# Patient Record
Sex: Female | Born: 1935 | Race: White | Hispanic: No | Marital: Married | Smoking: Never smoker
Health system: Southern US, Community
[De-identification: ages and names within clinical notes are randomized; demographics above are authoritative.]

## PROBLEM LIST (undated history)

## (undated) DIAGNOSIS — I10 Essential (primary) hypertension: Secondary | ICD-10-CM

## (undated) DIAGNOSIS — D0512 Intraductal carcinoma in situ of left breast: Secondary | ICD-10-CM

## (undated) DIAGNOSIS — R1310 Dysphagia, unspecified: Secondary | ICD-10-CM

## (undated) DIAGNOSIS — K3184 Gastroparesis: Secondary | ICD-10-CM

## (undated) DIAGNOSIS — M810 Age-related osteoporosis without current pathological fracture: Secondary | ICD-10-CM

## (undated) DIAGNOSIS — K219 Gastro-esophageal reflux disease without esophagitis: Secondary | ICD-10-CM

## (undated) DIAGNOSIS — N390 Urinary tract infection, site not specified: Secondary | ICD-10-CM

## (undated) DIAGNOSIS — K222 Esophageal obstruction: Secondary | ICD-10-CM

## (undated) DIAGNOSIS — D509 Iron deficiency anemia, unspecified: Secondary | ICD-10-CM

## (undated) DIAGNOSIS — K449 Diaphragmatic hernia without obstruction or gangrene: Secondary | ICD-10-CM

## (undated) DIAGNOSIS — M419 Scoliosis, unspecified: Secondary | ICD-10-CM

## (undated) DIAGNOSIS — I2699 Other pulmonary embolism without acute cor pulmonale: Secondary | ICD-10-CM

## (undated) HISTORY — PX: TUBAL LIGATION: SHX77

## (undated) HISTORY — PX: CATARACT EXTRACTION, BILATERAL: SHX1313

## (undated) HISTORY — DX: Gastroparesis: K31.84

## (undated) HISTORY — DX: Esophageal obstruction: K22.2

---

## 2002-07-30 DIAGNOSIS — D126 Benign neoplasm of colon, unspecified: Secondary | ICD-10-CM | POA: Insufficient documentation

## 2007-11-29 HISTORY — PX: LAPAROSCOPIC PARAESOPHAGEAL HERNIA REPAIR: SHX6307

## 2007-11-29 HISTORY — PX: BREAST LUMPECTOMY: SHX2

## 2009-09-03 DIAGNOSIS — R404 Transient alteration of awareness: Secondary | ICD-10-CM | POA: Insufficient documentation

## 2011-08-25 DIAGNOSIS — M810 Age-related osteoporosis without current pathological fracture: Secondary | ICD-10-CM | POA: Insufficient documentation

## 2011-09-05 DIAGNOSIS — D235 Other benign neoplasm of skin of trunk: Secondary | ICD-10-CM | POA: Insufficient documentation

## 2011-09-05 DIAGNOSIS — Z85828 Personal history of other malignant neoplasm of skin: Secondary | ICD-10-CM | POA: Insufficient documentation

## 2012-02-27 DIAGNOSIS — S82899A Other fracture of unspecified lower leg, initial encounter for closed fracture: Secondary | ICD-10-CM | POA: Insufficient documentation

## 2012-04-13 DIAGNOSIS — D0500 Lobular carcinoma in situ of unspecified breast: Secondary | ICD-10-CM | POA: Insufficient documentation

## 2012-05-10 DIAGNOSIS — H40009 Preglaucoma, unspecified, unspecified eye: Secondary | ICD-10-CM | POA: Insufficient documentation

## 2012-05-10 DIAGNOSIS — H2510 Age-related nuclear cataract, unspecified eye: Secondary | ICD-10-CM | POA: Insufficient documentation

## 2012-05-10 DIAGNOSIS — H524 Presbyopia: Secondary | ICD-10-CM | POA: Insufficient documentation

## 2012-10-18 DIAGNOSIS — Z7901 Long term (current) use of anticoagulants: Secondary | ICD-10-CM | POA: Insufficient documentation

## 2013-04-06 DIAGNOSIS — Z7901 Long term (current) use of anticoagulants: Secondary | ICD-10-CM | POA: Insufficient documentation

## 2013-06-11 DIAGNOSIS — M810 Age-related osteoporosis without current pathological fracture: Secondary | ICD-10-CM | POA: Insufficient documentation

## 2013-11-06 DIAGNOSIS — G8929 Other chronic pain: Secondary | ICD-10-CM | POA: Insufficient documentation

## 2014-03-19 DIAGNOSIS — B079 Viral wart, unspecified: Secondary | ICD-10-CM | POA: Insufficient documentation

## 2014-07-30 DIAGNOSIS — Z9841 Cataract extraction status, right eye: Secondary | ICD-10-CM | POA: Insufficient documentation

## 2014-08-28 DIAGNOSIS — H35363 Drusen (degenerative) of macula, bilateral: Secondary | ICD-10-CM | POA: Insufficient documentation

## 2015-03-23 DIAGNOSIS — D239 Other benign neoplasm of skin, unspecified: Secondary | ICD-10-CM | POA: Insufficient documentation

## 2016-08-16 DIAGNOSIS — I2699 Other pulmonary embolism without acute cor pulmonale: Secondary | ICD-10-CM | POA: Diagnosis present

## 2017-03-28 DIAGNOSIS — R1013 Epigastric pain: Secondary | ICD-10-CM | POA: Insufficient documentation

## 2017-09-11 DIAGNOSIS — I999 Unspecified disorder of circulatory system: Secondary | ICD-10-CM | POA: Insufficient documentation

## 2018-01-01 DIAGNOSIS — E538 Deficiency of other specified B group vitamins: Secondary | ICD-10-CM | POA: Insufficient documentation

## 2018-05-04 DIAGNOSIS — M5412 Radiculopathy, cervical region: Secondary | ICD-10-CM | POA: Insufficient documentation

## 2018-05-04 DIAGNOSIS — M19012 Primary osteoarthritis, left shoulder: Secondary | ICD-10-CM | POA: Insufficient documentation

## 2019-01-14 DIAGNOSIS — I10 Essential (primary) hypertension: Secondary | ICD-10-CM | POA: Insufficient documentation

## 2019-04-17 DIAGNOSIS — L650 Telogen effluvium: Secondary | ICD-10-CM | POA: Insufficient documentation

## 2019-04-17 DIAGNOSIS — L853 Xerosis cutis: Secondary | ICD-10-CM | POA: Insufficient documentation

## 2019-05-28 DIAGNOSIS — G4481 Hypnic headache: Secondary | ICD-10-CM | POA: Insufficient documentation

## 2019-07-04 DIAGNOSIS — N1831 Chronic kidney disease, stage 3a: Secondary | ICD-10-CM | POA: Insufficient documentation

## 2020-03-27 DIAGNOSIS — D509 Iron deficiency anemia, unspecified: Secondary | ICD-10-CM | POA: Insufficient documentation

## 2020-03-27 DIAGNOSIS — J479 Bronchiectasis, uncomplicated: Secondary | ICD-10-CM | POA: Insufficient documentation

## 2020-03-27 DIAGNOSIS — I350 Nonrheumatic aortic (valve) stenosis: Secondary | ICD-10-CM | POA: Insufficient documentation

## 2020-08-14 ENCOUNTER — Other Ambulatory Visit: Payer: Self-pay | Admitting: Surgery

## 2020-08-14 DIAGNOSIS — Z1231 Encounter for screening mammogram for malignant neoplasm of breast: Secondary | ICD-10-CM

## 2020-09-02 ENCOUNTER — Ambulatory Visit
Admission: RE | Admit: 2020-09-02 | Discharge: 2020-09-02 | Disposition: A | Discharge status: Home / Self Care | Payer: Medicare Other | Source: Ambulatory Visit | Attending: Surgery | Admitting: Surgery

## 2020-09-02 ENCOUNTER — Other Ambulatory Visit: Payer: Self-pay

## 2020-09-02 DIAGNOSIS — Z1231 Encounter for screening mammogram for malignant neoplasm of breast: Secondary | ICD-10-CM | POA: Diagnosis present

## 2020-09-25 ENCOUNTER — Encounter: Payer: Self-pay | Admitting: Urology

## 2020-09-25 ENCOUNTER — Ambulatory Visit (INDEPENDENT_AMBULATORY_CARE_PROVIDER_SITE_OTHER): Payer: Medicare Other | Admitting: Urology

## 2020-09-25 ENCOUNTER — Other Ambulatory Visit: Payer: Self-pay

## 2020-09-25 VITALS — BP 156/81 | HR 89 | Ht 61.0 in | Wt 91.4 lb

## 2020-09-25 DIAGNOSIS — R32 Unspecified urinary incontinence: Secondary | ICD-10-CM

## 2020-09-25 LAB — URINALYSIS, COMPLETE
Bilirubin, UA: NEGATIVE
Glucose, UA: NEGATIVE
Ketones, UA: NEGATIVE
Nitrite, UA: NEGATIVE
Protein,UA: NEGATIVE
RBC, UA: NEGATIVE
Specific Gravity, UA: 1.02 (ref 1.005–1.030)
Urobilinogen, Ur: 0.2 mg/dL (ref 0.2–1.0)
pH, UA: 6 (ref 5.0–7.5)

## 2020-09-25 LAB — MICROSCOPIC EXAMINATION: Bacteria, UA: NONE SEEN

## 2020-09-25 LAB — BLADDER SCAN AMB NON-IMAGING

## 2020-09-25 MED ORDER — TOLTERODINE TARTRATE ER 4 MG PO CP24: 4.0000 mg | ORAL_CAPSULE | Freq: Every day | ORAL | 11 refills | Status: DC

## 2020-09-25 NOTE — Patient Instructions (Signed)

## 2020-09-25 NOTE — Progress Notes (Signed)
09/25/2020 2:08 PM   [name] Mar 21, 1930 [id number]  Referring provider: Haydee Salter, MD [address] [place],  [place] [address]  Chief Complaint  Patient presents with   Urinary Incontinence    HPI:  Dominick was referred over for incontinence.  She complains of urgency, frequency and UUI. She tried oybutynin. No change and it made her feel nausea. Myrbetriq was expensive. Standing triggers.   Earlier this month she had some frequency and dysuria and was treated with cephalexin.  I did not see a culture. Symptoms resolved.   No NG risk. No pelvic surgery. She is on Elavil and now remeron. No constipation.   Her PVR is 6 mL.  CT scan May 2021 at [facility] was reported as benign.  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   [place]: No past medical history on file.  Surgical History: Past Surgical History:  Procedure Laterality Date   BREAST LUMPECTOMY Left 2009   Ductal Carcinoma insitu     Home Medications:  Allergies as of 09/25/2020      Reactions   Metronidazole Other (See Comments)   Other reaction(s): Other Mouth sores Mouth sores Mouth sores   Omeprazole Other (See Comments)   Other reaction(s): Arthralgias (intolerance) Other reaction(s): Other   Codeine Nausea And Vomiting, Nausea Only   Other reaction(s): Nausea Only But tolerates tramadol      Medication List       Accurate as of September 25, 2020  2:08 PM. If you have any questions, ask your nurse or doctor.        STOP taking these medications   calcium elemental as carbonate 400 MG chewable tablet Commonly known as: BARIATRIC TUMS ULTRA Stopped by: Festus Aloe, MD     TAKE these medications   acetaminophen 500 MG tablet Commonly known as: TYLENOL Take 500 mg by mouth at bedtime as needed.   amitriptyline 10 MG tablet Commonly known as: ELAVIL Take 10 mg by mouth at bedtime.   calcium carbonate 1250 (500 Ca) MG chewable  tablet Commonly known as: OS-CAL Chew by mouth.   Cholecalciferol 50 MCG (2000 UT) Caps Take by mouth.   cyanocobalamin 1000 MCG tablet Take by mouth.   diclofenac Sodium 1 % Gel Commonly known as: VOLTAREN Apply topically 4 (four) times daily.   econazole nitrate 1 % cream Apply topically daily.   ferrous sulfate 325 (65 FE) MG tablet Take by mouth.   folic acid 1 MG tablet Commonly known as: FOLVITE TAKE 1 TABLET(1 MG) BY MOUTH DAILY   loperamide 2 MG capsule Commonly known as: IMODIUM Take by mouth as needed for diarrhea or loose stools.   mirtazapine 15 MG tablet Commonly known as: REMERON Take 15 mg by mouth at bedtime.   oxybutynin 5 MG 24 hr tablet Commonly known as: DITROPAN-XL Take 5 mg by mouth daily.   pantoprazole 40 MG tablet Commonly known as: PROTONIX Take 40 mg by mouth daily.   promethazine 25 MG tablet Commonly known as: PHENERGAN Take 25 mg by mouth every 6 (six) hours as needed for nausea or vomiting.   SUMAtriptan 50 MG tablet Commonly known as: IMITREX Take 1 at onset of headache. May repeat once in two hours. Do not exceed 2 per day or 4 per week.   warfarin 5 MG tablet Commonly known as: COUMADIN TAKE 1 TABLET BY MOUTH NIGHTLY, EXCEPT TAKE 1/2 TABLET ON MONDAYS, WEDNESDAY AND FRIDAYS  Allergies:  Allergies  Allergen Reactions   Metronidazole Other (See Comments)    Other reaction(s): Other Mouth sores Mouth sores Mouth sores    Omeprazole Other (See Comments)    Other reaction(s): Arthralgias (intolerance) Other reaction(s): Other    Codeine Nausea And Vomiting and Nausea Only    Other reaction(s): Nausea Only But tolerates tramadol     Family History: Family History  Problem Relation Age of Onset   Breast cancer Neg Hx     Social History:  has no history on file for tobacco use, alcohol use, and drug use.   Physical Exam: BP (!) 156/81 (BP Location: Left Arm, Patient Position: Sitting, Cuff Size:  Small)    Pulse 89    Ht 5\' 1"  (1.549 m)    Wt 91 lb 6.4 oz (41.5 kg)    BMI 17.27 kg/m   Constitutional:  Alert and oriented, No acute distress. HEENT: [place] AT, moist mucus membranes.  Trachea midline, no masses. Cardiovascular: No clubbing, cyanosis, or edema. Respiratory: Normal respiratory effort, no increased work of breathing. GI: Abdomen is soft, nontender, nondistended, no abdominal masses GU: no CVA tenderness  Skin: No rashes, bruises or suspicious lesions. Neurologic: Grossly intact, no focal deficits, moving all 4 extremities. Psychiatric: Normal mood and affect.  Laboratory Data: No results found for: WBC, HGB, HCT, MCV, PLT  No results found for: CREATININE  No results found for: PSA  No results found for: TESTOSTERONE  No results found for: HGBA1C  Urinalysis No results found for: COLORURINE, APPEARANCEUR, LABSPEC, PHURINE, GLUCOSEU, HGBUR, BILIRUBINUR, KETONESUR, PROTEINUR, UROBILINOGEN, NITRITE, LEUKOCYTESUR  No results found for: LABMICR, [place], RBCUA, LABEPIT, MUCUS, BACTERIA  Pertinent Imaging:n/a  No results found for this or any previous visit.  No results found for this or any previous visit.  No results found for this or any previous visit.  No results found for this or any previous visit.  No results found for this or any previous visit.  No results found for this or any previous visit.  No results found for this or any previous visit.  No results found for this or any previous visit.   Assessment & Plan:    1. Urinary incontinence, unspecified type Discussed referral to PT, trial of other OAB meds and beta 3 as well.She elects trial of OAB meds and I sent tolterodine based on her formulary.  - Urinalysis, Complete - Bladder Scan (Post Void Residual) in office   No follow-ups on file.  Festus Aloe, MD  [facility] [address] [place], [place] [address] [phone]

## 2020-12-18 ENCOUNTER — Ambulatory Visit: Payer: Medicare Other | Admitting: Urology

## 2021-01-29 ENCOUNTER — Encounter: Payer: Self-pay | Admitting: Physician Assistant

## 2021-01-29 ENCOUNTER — Telehealth: Payer: Self-pay | Admitting: Physician Assistant

## 2021-01-29 ENCOUNTER — Other Ambulatory Visit: Payer: Self-pay

## 2021-01-29 ENCOUNTER — Ambulatory Visit (INDEPENDENT_AMBULATORY_CARE_PROVIDER_SITE_OTHER): Payer: Medicare Other | Admitting: Physician Assistant

## 2021-01-29 VITALS — BP 156/89 | HR 85 | Ht 61.0 in | Wt 93.0 lb

## 2021-01-29 DIAGNOSIS — N3001 Acute cystitis with hematuria: Secondary | ICD-10-CM

## 2021-01-29 LAB — MICROSCOPIC EXAMINATION: WBC, UA: >30 /hpf — AB (ref 0–5)

## 2021-01-29 LAB — URINALYSIS, COMPLETE
Bilirubin, UA: NEGATIVE
Glucose, UA: NEGATIVE
Ketones, UA: NEGATIVE
Nitrite, UA: POSITIVE — AB
Protein,UA: NEGATIVE
Specific Gravity, UA: <1.005 — ABNORMAL LOW (ref 1.005–1.030)
Urobilinogen, Ur: 0.2 mg/dL (ref 0.2–1.0)
pH, UA: 5.5 (ref 5.0–7.5)

## 2021-01-29 MED ORDER — NITROFURANTOIN MONOHYD MACRO 100 MG PO CAPS: 100.0000 mg | ORAL_CAPSULE | Freq: Two times a day (BID) | ORAL | 0 refills | Status: AC

## 2021-01-29 NOTE — Progress Notes (Signed)
01/29/2021 1:14 PM   Kristina Rowe 03-19-1930 [id number]  CC: Chief Complaint  Patient presents with  . Acute Visit    Urinary burning and frequency   HPI: Kristina Rowe is a 87 y.o. female with OAB wet on tolterodine after having failed oxybutynin and stopping Myrbetriq due to cost who presents today for evaluation of possible UTI.   Today she reports a 2-day history of dysuria, urgency, frequency, and increased urinary leakage.  She denies fever, chills, nausea, vomiting, gross hematuria, and flank pain.  She has not taken any medication to help her symptoms at home.  She denies a history of recurrent UTI and is on warfarin.  In-office UA today positive for 1+ blood, nitrites, and 2+ leukocyte esterase; urine microscopy with >30 WBCs/HPF, 3-10 RBCs/HPF, and many bacteria.  PMH: No past medical history on file.  Surgical History: Past Surgical History:  Procedure Laterality Date  . BREAST LUMPECTOMY Left 2009   Ductal Carcinoma insitu     Home Medications:  Allergies as of 01/29/2021      Reactions   Metronidazole Other (See Comments)   Other reaction(s): Other Mouth sores Mouth sores Mouth sores   Omeprazole Other (See Comments)   Other reaction(s): Arthralgias (intolerance) Other reaction(s): Other   Codeine Nausea And Vomiting, Nausea Only   Other reaction(s): Nausea Only But tolerates tramadol      Medication List       Accurate as of January 29, 2021  1:14 PM. If you have any questions, ask your nurse or doctor.        STOP taking these medications   ferrous sulfate 325 (65 FE) MG tablet Stopped by: Debroah Loop, PA-C   mirtazapine 15 MG tablet Commonly known as: REMERON Stopped by: Debroah Loop, PA-C   warfarin 5 MG tablet Commonly known as: COUMADIN Stopped by: Debroah Loop, PA-C     TAKE these medications   acetaminophen 500 MG tablet Commonly known as: TYLENOL Take 500 mg by mouth at bedtime as needed.    amitriptyline 10 MG tablet Commonly known as: ELAVIL Take 10 mg by mouth at bedtime.   calcium carbonate 1250 (500 Ca) MG chewable tablet Commonly known as: OS-CAL Chew by mouth.   Cholecalciferol 50 MCG (2000 UT) Caps Take by mouth.   cyanocobalamin 1000 MCG tablet Take by mouth.   diclofenac Sodium 1 % Gel Commonly known as: VOLTAREN Apply topically 4 (four) times daily.   econazole nitrate 1 % cream Apply topically daily.   folic acid 1 MG tablet Commonly known as: FOLVITE TAKE 1 TABLET(1 MG) BY MOUTH DAILY   loperamide 2 MG capsule Commonly known as: IMODIUM Take by mouth as needed for diarrhea or loose stools.   nitrofurantoin (macrocrystal-monohydrate) 100 MG capsule Commonly known as: MACROBID Take 1 capsule (100 mg total) by mouth every 12 (twelve) hours for 5 days. Started by: Debroah Loop, PA-C   pantoprazole 40 MG tablet Commonly known as: PROTONIX Take 40 mg by mouth daily.   promethazine 25 MG tablet Commonly known as: PHENERGAN Take 25 mg by mouth every 6 (six) hours as needed for nausea or vomiting.   SUMAtriptan 50 MG tablet Commonly known as: IMITREX Take 1 at onset of headache. May repeat once in two hours. Do not exceed 2 per day or 4 per week.   tolterodine 4 MG 24 hr capsule Commonly known as: DETROL LA Take 1 capsule (4 mg total) by mouth daily.  Allergies:  Allergies  Allergen Reactions  . Metronidazole Other (See Comments)    Other reaction(s): Other Mouth sores Mouth sores Mouth sores   . Omeprazole Other (See Comments)    Other reaction(s): Arthralgias (intolerance) Other reaction(s): Other   . Codeine Nausea And Vomiting and Nausea Only    Other reaction(s): Nausea Only But tolerates tramadol     Family History: Family History  Problem Relation Age of Onset  . Breast cancer Neg Hx     Social History:   reports that she has never smoked. She has never used smokeless tobacco. She reports that she  does not drink alcohol and does not use drugs.  Physical Exam: BP (!) 156/89   Pulse 85   Ht 5\' 1"  (1.549 m)   Wt 93 lb (42.2 kg)   BMI 17.57 kg/m   Constitutional:  Alert and oriented, no acute distress, nontoxic appearing HEENT: [place], AT Cardiovascular: No clubbing, cyanosis, or edema Respiratory: Normal respiratory effort, no increased work of breathing Skin: No rashes, bruises or suspicious lesions Neurologic: Grossly intact, no focal deficits, moving all 4 extremities Psychiatric: Normal mood and affect  Laboratory Data: Results for orders placed or performed in visit on 01/29/21  CULTURE, URINE COMPREHENSIVE   Specimen: Urine   UR  Result Value Ref Range   Urine Culture, Comprehensive Preliminary report (A)    Organism ID, Bacteria Escherichia coli (A)    Organism ID, Bacteria Comment   Microscopic Examination   Urine  Result Value Ref Range   WBC, UA >30 (A) 0 - 5 /hpf   RBC 3-10 (A) 0 - 2 /hpf   Epithelial Cells (non renal) 0-10 0 - 10 /hpf   Bacteria, UA Many (A) None seen/Few  Urinalysis, Complete  Result Value Ref Range   Specific Gravity, UA <1.005 (L) 1.005 - 1.030   pH, UA 5.5 5.0 - 7.5   Color, UA Yellow Yellow   Appearance Ur Cloudy (A) Clear   Leukocytes,UA 2+ (A) Negative   Protein,UA Negative Negative/Trace   Glucose, UA Negative Negative   Ketones, UA Negative Negative   RBC, UA 1+ (A) Negative   Bilirubin, UA Negative Negative   Urobilinogen, Ur 0.2 0.2 - 1.0 mg/dL   Nitrite, UA Positive (A) Negative   Microscopic Examination See below:    Assessment & Plan:   1. Acute cystitis with hematuria Patient clinically infected today with grossly positive UA.  Will start empiric Macrobid and send for culture for further evaluation.  She does not appear to have a history of culture proven recurrent UTIs.  Counseled her to follow-up in clinic with infections in the future in case these become more frequent for her.  She expressed understanding.  We will  repeat UA in 1 week to prove resolution of microscopic hematuria. - Urinalysis, Complete - CULTURE, URINE COMPREHENSIVE - nitrofurantoin, macrocrystal-monohydrate, (MACROBID) 100 MG capsule; Take 1 capsule (100 mg total) by mouth every 12 (twelve) hours for 5 days.  Dispense: 10 capsule; Refill: 0  Return in about 1 week (around 02/05/2021) for Lab visit for UA.  Debroah Loop, PA-C  [facility] [address], [place], [place] [address] [phone]

## 2021-01-29 NOTE — Telephone Encounter (Signed)
Kristina Rowe patient called the office this morning with complaint of possible infection. He is experiencing burning during urination and increased frequency.    Added patient to [name]'s schedule for today.

## 2021-02-02 LAB — CULTURE, URINE COMPREHENSIVE

## 2021-02-05 ENCOUNTER — Other Ambulatory Visit: Payer: Self-pay

## 2021-02-05 ENCOUNTER — Telehealth: Payer: Self-pay | Admitting: Physician Assistant

## 2021-02-05 ENCOUNTER — Other Ambulatory Visit: Payer: Medicare Other

## 2021-02-05 DIAGNOSIS — R32 Unspecified urinary incontinence: Secondary | ICD-10-CM

## 2021-02-05 DIAGNOSIS — N3001 Acute cystitis with hematuria: Secondary | ICD-10-CM

## 2021-02-05 LAB — URINALYSIS, COMPLETE
Bilirubin, UA: NEGATIVE
Glucose, UA: NEGATIVE
Ketones, UA: NEGATIVE
Nitrite, UA: NEGATIVE
Protein,UA: NEGATIVE
RBC, UA: NEGATIVE
Specific Gravity, UA: 1.015 (ref 1.005–1.030)
Urobilinogen, Ur: 0.2 mg/dL (ref 0.2–1.0)
pH, UA: 5.5 (ref 5.0–7.5)

## 2021-02-05 LAB — MICROSCOPIC EXAMINATION: Bacteria, UA: NONE SEEN

## 2021-02-05 NOTE — Telephone Encounter (Signed)
Please contact the patient and inform her that her repeat UA today showed resolution of microscopic hematuria.  This is excellent news, nothing further to do.  She can follow-up as needed.

## 2021-02-05 NOTE — Telephone Encounter (Signed)
Patient informed, voiced understanding.  °

## 2021-02-09 ENCOUNTER — Other Ambulatory Visit: Payer: Self-pay

## 2021-02-09 ENCOUNTER — Ambulatory Visit (INDEPENDENT_AMBULATORY_CARE_PROVIDER_SITE_OTHER): Payer: Medicare Other | Admitting: Urology

## 2021-02-09 ENCOUNTER — Other Ambulatory Visit: Payer: Self-pay | Admitting: *Deleted

## 2021-02-09 ENCOUNTER — Encounter: Payer: Self-pay | Admitting: Urology

## 2021-02-09 ENCOUNTER — Ambulatory Visit: Payer: Medicare Other | Admitting: Urology

## 2021-02-09 ENCOUNTER — Other Ambulatory Visit
Admission: RE | Admit: 2021-02-09 | Discharge: 2021-02-09 | Disposition: A | Discharge status: Home / Self Care | Payer: Medicare Other | Attending: Urology | Admitting: Urology

## 2021-02-09 VITALS — BP 156/86 | HR 94 | Ht 61.0 in | Wt 93.0 lb

## 2021-02-09 DIAGNOSIS — N3001 Acute cystitis with hematuria: Secondary | ICD-10-CM

## 2021-02-09 DIAGNOSIS — N390 Urinary tract infection, site not specified: Secondary | ICD-10-CM

## 2021-02-09 DIAGNOSIS — N3281 Overactive bladder: Secondary | ICD-10-CM | POA: Diagnosis not present

## 2021-02-09 LAB — URINALYSIS, COMPLETE (UACMP) WITH MICROSCOPIC
Bilirubin Urine: NEGATIVE
Glucose, UA: NEGATIVE mg/dL
Ketones, ur: NEGATIVE mg/dL
Nitrite: NEGATIVE
Protein, ur: NEGATIVE mg/dL
Specific Gravity, Urine: 1.02 (ref 1.005–1.030)
WBC, UA: >50 WBC/hpf (ref 0–5)
pH: 5.5 (ref 5.0–8.0)

## 2021-02-09 MED ORDER — SULFAMETHOXAZOLE-TRIMETHOPRIM 800-160 MG PO TABS: 1.0000 | ORAL_TABLET | Freq: Two times a day (BID) | ORAL | 0 refills | Status: DC

## 2021-02-09 MED ORDER — TOLTERODINE TARTRATE ER 4 MG PO CP24: 4.0000 mg | ORAL_CAPSULE | Freq: Every day | ORAL | 11 refills | Status: DC

## 2021-02-09 NOTE — Progress Notes (Signed)
   02/09/2021 1:06 PM   [name] Aug 10, 1930 [id number]  Reason for visit: UTI, OAB  HPI: I saw Ms. Kristina Rowe as an add-on today for possible UTI.  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 Junious Silk and Debroah Loop, PA.  She has baseline OAB and was on tolterodine with some improvement, but recently discontinued this medication secondary to cost.  She is having some urgency, frequency, and urge incontinence.  She was an add-on in clinic today for suspected UTI and she reports 2 days of foul-smelling urine and dysuria that feels similar to prior UTIs.  She was recently seen by our PA on 01/29/2021 with similar symptoms and urine culture grew E. coli, and she was treated with culture appropriate Macrobid.  She reports some mild left-sided flank pain, but thinks this is from her baseline sciatica.  I reviewed her outside notes and she had a CT chest abdomen pelvis in May 2021 that showed no evidence of hydronephrosis or stones.  She denies any prior stone history.  She denies any fevers or chills, or gross hematuria.  Urinalysis today concerning for infection with greater than 50 WBCs, 6-10 RBCs, many bacteria.  Will send for culture.  I recommended Bactrim DS twice daily x3 days for likely persistent UTI incompletely treated with Macrobid.  Return precautions were discussed extensively including worsening left flank pain or fever.  We will call with culture results.  Regarding her OAB symptoms, I gave her a good Rx coupon for tolterodine for Kristopher Oppenheim which is significantly more affordable, and she will resume this medication  RTC 6 months symptom check  Billey Co, MD  [facility] [address] [place], [place] [address] [phone]

## 2021-02-16 ENCOUNTER — Ambulatory Visit (INDEPENDENT_AMBULATORY_CARE_PROVIDER_SITE_OTHER): Payer: Medicare Other | Admitting: Physician Assistant

## 2021-02-16 ENCOUNTER — Other Ambulatory Visit: Payer: Self-pay

## 2021-02-16 ENCOUNTER — Encounter: Payer: Self-pay | Admitting: Physician Assistant

## 2021-02-16 VITALS — BP 168/80 | HR 95 | Temp 97.8°F | Ht 61.0 in | Wt 91.7 lb

## 2021-02-16 DIAGNOSIS — N390 Urinary tract infection, site not specified: Secondary | ICD-10-CM

## 2021-02-16 LAB — URINALYSIS, COMPLETE
Bilirubin, UA: NEGATIVE
Glucose, UA: NEGATIVE
Ketones, UA: NEGATIVE
Nitrite, UA: NEGATIVE
Protein,UA: NEGATIVE
RBC, UA: NEGATIVE
Specific Gravity, UA: 1.01 (ref 1.005–1.030)
Urobilinogen, Ur: 0.2 mg/dL (ref 0.2–1.0)
pH, UA: 6.5 (ref 5.0–7.5)

## 2021-02-16 LAB — MICROSCOPIC EXAMINATION

## 2021-02-16 LAB — BLADDER SCAN AMB NON-IMAGING

## 2021-02-16 MED ORDER — PREMARIN 0.625 MG/GM VA CREA: TOPICAL_CREAM | VAGINAL | 3 refills | Status: DC

## 2021-02-16 NOTE — Patient Instructions (Signed)
1. Start taking an over-the-counter probiotic, preferably containing lactobacillus. Take this daily. 2. Start Premarin vaginal estrogen cream. Apply a pea-sized amount around the urethra daily for two weeks, then three times weekly. 3. The imaging department will call you to schedule your renal ultrasound. 4. If you develop a fever, uncontrollable pain, or uncontrollable nausea/vomiting, please contact our office immediately or proceed to the Emergency Department.

## 2021-02-16 NOTE — Progress Notes (Signed)
02/16/2021 10:21 AM   Kristina Rowe [birth year] [id number]  CC: Chief Complaint  Patient presents with  . Dysuria    HPI: Kristina Rowe is a 87 y.o. female with OAB wet on tolterodine who presents today for evaluation of possible recurrent versus persistent UTI.  I first saw her in clinic 18 days ago for evaluation of possible UTI.  UA was grossly infected that day and I treated her with empiric Macrobid 100 mg twice daily x5 days.  Urine culture ultimately resulted with cefuroxime intermediate E. coli.  Repeat UA showed resolution of microscopic hematuria.  She followed up with Kristina Rowe 7 days ago with recurrent malodorous urine and dysuria associated with mild left flank pain.  UA that day again notable for pyuria, microscopic hematuria, and bacteriuria.  She was treated with empiric Bactrim DS twice daily x3 days for management of persistent UTI.  Urine culture was not sent that day.  Today she reports her dysuria and urgency have improved but not resolved after completing Bactrim.  She had significant nausea with Bactrim that has improved since completing the course, however it is persistent today.  Her left flank pain is worse and Tylenol is not relieving it.  She denies fever, chills, and vomiting.  She has a remote history of breast cancer not currently on hormone therapy.  In-office UA today positive for 1+ leukocyte esterase; urine microscopy with 6-10 WBCs/HPF. PVR 67mL.  PMH: No past medical history on file.  Surgical History: Past Surgical History:  Procedure Laterality Date  . BREAST LUMPECTOMY Left 2009   Ductal Carcinoma insitu     Home Medications:  Allergies as of 02/16/2021      Reactions   Metronidazole Other (See Comments)   Other reaction(s): Other Mouth sores Mouth sores Mouth sores   Omeprazole Other (See Comments)   Other reaction(s): Arthralgias (intolerance) Other reaction(s): Other   Codeine Nausea And Vomiting, Nausea Only   Other  reaction(s): Nausea Only But tolerates tramadol      Medication List       Accurate as of February 16, 2021 10:21 AM. If you have any questions, ask your nurse or doctor.        STOP taking these medications   sulfamethoxazole-trimethoprim 800-160 MG tablet Commonly known as: BACTRIM DS Stopped by: Kristina Rowe     TAKE these medications   acetaminophen 500 MG tablet Commonly known as: TYLENOL Take 500 mg by mouth at bedtime as needed.   amitriptyline 10 MG tablet Commonly known as: ELAVIL Take 10 mg by mouth at bedtime.   calcium carbonate 1250 (500 Ca) MG chewable tablet Commonly known as: OS-CAL Chew by mouth.   Cholecalciferol 50 MCG (2000 UT) Caps Take by mouth.   cyanocobalamin 1000 MCG tablet Take by mouth.   diclofenac Sodium 1 % Gel Commonly known as: VOLTAREN Apply topically 4 (four) times daily.   econazole nitrate 1 % cream Apply topically daily.   folic acid 1 MG tablet Commonly known as: FOLVITE TAKE 1 TABLET(1 MG) BY MOUTH DAILY   loperamide 2 MG capsule Commonly known as: IMODIUM Take by mouth as needed for diarrhea or loose stools.   pantoprazole 40 MG tablet Commonly known as: PROTONIX Take 40 mg by mouth daily.   promethazine 25 MG tablet Commonly known as: PHENERGAN Take 25 mg by mouth every 6 (six) hours as needed for nausea or vomiting.   SUMAtriptan 50 MG tablet Commonly known as: IMITREX Take 1 at  onset of headache. May repeat once in two hours. Do not exceed 2 per day or 4 per week.   tolterodine 4 MG 24 hr capsule Commonly known as: DETROL LA Take 1 capsule (4 mg total) by mouth daily.       Allergies:  Allergies  Allergen Reactions  . Metronidazole Other (See Comments)    Other reaction(s): Other Mouth sores Mouth sores Mouth sores   . Omeprazole Other (See Comments)    Other reaction(s): Arthralgias (intolerance) Other reaction(s): Other   . Codeine Nausea And Vomiting and Nausea Only    Other  reaction(s): Nausea Only But tolerates tramadol     Family History: Family History  Problem Relation Age of Onset  . Breast cancer Neg Hx     Social History:   reports that she has never smoked. She has never used smokeless tobacco. She reports that she does not drink alcohol and does not use drugs.  Physical Exam: BP (!) 168/80   Pulse 95   Temp 97.8 F (36.6 C) (Oral)   Ht 5\' 1"  (1.549 m)   Wt 91 lb 11.2 oz (41.6 kg)   BMI 17.33 kg/m   Constitutional:  Alert and oriented, no acute distress, nontoxic appearing HEENT: [place], AT Cardiovascular: No clubbing, cyanosis, or edema Respiratory: Normal respiratory effort, no increased work of breathing Skin: No rashes, bruises or suspicious lesions Neurologic: Grossly intact, no focal deficits, moving all 4 extremities Psychiatric: Normal mood and affect  Laboratory Data: Results for orders placed or performed in visit on 02/16/21  Microscopic Examination   Urine  Result Value Ref Range   WBC, UA 6-10 (A) 0 - 5 /hpf   RBC 0-2 0 - 2 /hpf   Epithelial Cells (non renal) 0-10 0 - 10 /hpf   Renal Epithel, UA 0-10 (A) None seen /hpf   Casts Present (A) None seen /lpf   Cast Type Hyaline casts N/A   Bacteria, UA Few None seen/Few  Urinalysis, Complete  Result Value Ref Range   Specific Gravity, UA 1.010 1.005 - 1.030   pH, UA 6.5 5.0 - 7.5   Color, UA Yellow Yellow   Appearance Ur Clear Clear   Leukocytes,UA 1+ (A) Negative   Protein,UA Negative Negative/Trace   Glucose, UA Negative Negative   Ketones, UA Negative Negative   RBC, UA Negative Negative   Bilirubin, UA Negative Negative   Urobilinogen, Ur 0.2 0.2 - 1.0 mg/dL   Nitrite, UA Negative Negative   Microscopic Examination See below:   Bladder Scan (Post Void Residual) in office  Result Value Ref Range   Scan Result 42mL    Assessment & Plan:   1. Recurrent UTI UA today notable for mild pyuria, improved over prior.  Will send for culture for further evaluation and  defer antibiotic therapy pending these results.  Given her ongoing flank pain and concern for recurrent versus persistent UTI, I recommend renal ultrasound and cystoscopy at this time.  She is in agreement with this plan.  Lastly, I recommended starting topical vaginal estrogen cream for management of recurrent UTI.  She is in agreement with this plan. - Urinalysis, Complete - CULTURE, URINE COMPREHENSIVE - Mycoplasma / ureaplasma culture - US RENAL; Future - conjugated estrogens (PREMARIN) vaginal cream; Apply one pea-sized amount around the opening of the urethra daily for two weeks, then three times weekly thereafter.  Dispense: 30 g; Refill: 3 - Bladder Scan (Post Void Residual) in office   Return in about 2 weeks (  around 03/02/2021) for Cystoscopy and RUS results with Kristina Rowe.  Kristina Rowe  [facility] [address], [place], [place] [address] [phone]

## 2021-02-19 LAB — CULTURE, URINE COMPREHENSIVE

## 2021-02-22 LAB — MYCOPLASMA / UREAPLASMA CULTURE
Mycoplasma hominis Culture: NEGATIVE
Ureaplasma urealyticum: NEGATIVE

## 2021-03-04 ENCOUNTER — Ambulatory Visit (INDEPENDENT_AMBULATORY_CARE_PROVIDER_SITE_OTHER): Payer: Medicare Other | Admitting: Urology

## 2021-03-04 ENCOUNTER — Encounter: Payer: Self-pay | Admitting: Urology

## 2021-03-04 ENCOUNTER — Other Ambulatory Visit: Payer: Self-pay

## 2021-03-04 VITALS — BP 117/73 | HR 96 | Ht 61.0 in | Wt 90.0 lb

## 2021-03-04 DIAGNOSIS — N390 Urinary tract infection, site not specified: Secondary | ICD-10-CM

## 2021-03-04 DIAGNOSIS — N3281 Overactive bladder: Secondary | ICD-10-CM

## 2021-03-04 MED ORDER — ESTRADIOL 0.1 MG/GM VA CREA: TOPICAL_CREAM | VAGINAL | 3 refills | Status: DC

## 2021-03-04 NOTE — Progress Notes (Signed)
Cystoscopy Procedure Note:  Indication: Urgency and frequency  After informed consent and discussion of the procedure and its risks, Kristina Rowe was positioned and prepped in the standard fashion. Cystoscopy was performed with a flexible cystoscope. The urethra, bladder neck and entire bladder was visualized in a standard fashion.  Urine was cloudy, but no suspicious lesions, no abnormalities on retroflexion.  The ureteral orifices were visualized in their normal location and orientation.  Urine sent for culture.  Findings: No suspicious lesions  Assessment and Plan: Recommended a trial of the tolterodine for more OAB type picture.  She is not having any dysuria or pain, just overactive symptoms.  We will follow-up urine culture and call with those results as well.  Also agree with topical estrogen.  We will call with renal ultrasound results.  Nickolas Madrid, MD 03/04/2021

## 2021-03-06 DIAGNOSIS — D649 Anemia, unspecified: Secondary | ICD-10-CM | POA: Insufficient documentation

## 2021-03-06 NOTE — Progress Notes (Deleted)
[place]  [phone] [phone]  ID: Kristina Rowe OB: 05/13/1930  MR#: [id number]  NIO#:[id number]  Patient Care Team: Haydee Salter, MD as PCP - General (Internal Medicine)  CHIEF COMPLAINT: Anemia, unspecified.  INTERVAL HISTORY: ***  REVIEW OF SYSTEMS:   ROS  As per HPI. Otherwise, a complete review of systems is negative.  PAST MEDICAL HISTORY: No past medical history on file.  PAST SURGICAL HISTORY: Past Surgical History:  Procedure Laterality Date  . BREAST LUMPECTOMY Left 2009   Ductal Carcinoma insitu     FAMILY HISTORY: Family History  Problem Relation Age of Onset  . Breast cancer Neg Hx     ADVANCED DIRECTIVES (Y/N):  N  HEALTH MAINTENANCE: Social History   Tobacco Use  . Smoking status: Never Smoker  . Smokeless tobacco: Never Used  Substance Use Topics  . Alcohol use: Never  . Drug use: Never     Colonoscopy:  PAP:  Bone density:  Lipid panel:  Allergies  Allergen Reactions  . Metronidazole Other (See Comments)    Other reaction(s): Other Mouth sores Mouth sores Mouth sores   . Omeprazole Other (See Comments)    Other reaction(s): Arthralgias (intolerance) Other reaction(s): Other   . Bactrim [Sulfamethoxazole-Trimethoprim] Nausea Only  . Codeine Nausea And Vomiting and Nausea Only    Other reaction(s): Nausea Only But tolerates tramadol     Current Outpatient Medications  Medication Sig Dispense Refill  . acetaminophen (TYLENOL) 500 MG tablet Take 500 mg by mouth at bedtime as needed.    Marland Kitchen amitriptyline (ELAVIL) 10 MG tablet Take 10 mg by mouth at bedtime.    Marland Kitchen amLODipine (NORVASC) 2.5 MG tablet Take 2.5 mg by mouth daily.    . calcium carbonate (OS-CAL) 1250 (500 Ca) MG chewable tablet Chew by mouth.    . Cholecalciferol 50 MCG (2000 UT) CAPS Take by mouth.    . cyanocobalamin 1000 MCG tablet Take by mouth.    . diclofenac Sodium (VOLTAREN) 1 % GEL Apply topically 4 (four) times  daily.    Marland Kitchen econazole nitrate 1 % cream Apply topically daily.    Marland Kitchen estradiol (ESTRACE) 0.1 MG/GM vaginal cream Estrogen Cream Instruction Discard applicator Apply pea sized amount to tip of finger to urethra before bed. Wash hands well after application. Use Monday, Wednesday and Friday 81.8 g 3  . folic acid (FOLVITE) 1 MG tablet TAKE 1 TABLET(1 MG) BY MOUTH DAILY    . loperamide (IMODIUM) 2 MG capsule Take by mouth as needed for diarrhea or loose stools.    . mirtazapine (REMERON) 30 MG tablet Take 30 mg by mouth at bedtime.    . pantoprazole (PROTONIX) 40 MG tablet Take 40 mg by mouth daily.    . promethazine (PHENERGAN) 25 MG tablet Take 25 mg by mouth every 6 (six) hours as needed for nausea or vomiting.    . SUMAtriptan (IMITREX) 50 MG tablet Take 1 at onset of headache. May repeat once in two hours. Do not exceed 2 per day or 4 per week.    . tolterodine (DETROL LA) 4 MG 24 hr capsule Take 1 capsule (4 mg total) by mouth daily. 30 capsule 11   No current facility-administered medications for this visit.    OBJECTIVE: There were no vitals filed for this visit.   There is no height or weight on file to calculate BMI.    ECOG FS:{CHL ONC [id number]  General: Well-developed, well-nourished, no acute distress. Eyes: Pink conjunctiva,  anicteric sclera. HEENT: Normocephalic, moist mucous membranes. Lungs: No audible wheezing or coughing. Heart: Regular rate and rhythm. Abdomen: Soft, nontender, no obvious distention. Musculoskeletal: No edema, cyanosis, or clubbing. Neuro: Alert, answering all questions appropriately. Cranial nerves grossly intact. Skin: No rashes or petechiae noted. Psych: Normal affect. Lymphatics: No cervical, calvicular, axillary or inguinal LAD.   LAB RESULTS:  No results found for: NA, K, CL, CO2, GLUCOSE, BUN, CREATININE, CALCIUM, PROT, ALBUMIN, AST, ALT, ALKPHOS, BILITOT, GFRNONAA, GFRAA  No results found for: WBC, NEUTROABS, HGB, HCT, MCV,  PLT   STUDIES: No results found.  ASSESSMENT: Anemia, unspecified.  PLAN:    1. Anemia, unspecified:  Patient expressed understanding and was in agreement with this plan. She also understands that She can call clinic at any time with any questions, concerns, or complaints.   Cancer Staging No matching staging information was found for the patient.  Lloyd Huger, MD   03/06/2021 12:38 PM

## 2021-03-09 ENCOUNTER — Other Ambulatory Visit: Payer: Self-pay

## 2021-03-09 ENCOUNTER — Inpatient Hospital Stay
Admission: EM | Admit: 2021-03-09 | Discharge: 2021-03-14 | DRG: 328 | Disposition: A | Discharge status: Home / Self Care | Payer: Medicare Other | Attending: Surgery | Admitting: Surgery

## 2021-03-09 ENCOUNTER — Telehealth: Payer: Self-pay

## 2021-03-09 ENCOUNTER — Emergency Department: Payer: Medicare Other

## 2021-03-09 DIAGNOSIS — Z79899 Other long term (current) drug therapy: Secondary | ICD-10-CM

## 2021-03-09 DIAGNOSIS — K660 Peritoneal adhesions (postprocedural) (postinfection): Secondary | ICD-10-CM | POA: Diagnosis present

## 2021-03-09 DIAGNOSIS — I10 Essential (primary) hypertension: Secondary | ICD-10-CM | POA: Diagnosis present

## 2021-03-09 DIAGNOSIS — K562 Volvulus: Principal | ICD-10-CM | POA: Diagnosis present

## 2021-03-09 DIAGNOSIS — Z20822 Contact with and (suspected) exposure to covid-19: Secondary | ICD-10-CM | POA: Diagnosis present

## 2021-03-09 DIAGNOSIS — D6489 Other specified anemias: Secondary | ICD-10-CM | POA: Diagnosis present

## 2021-03-09 DIAGNOSIS — R791 Abnormal coagulation profile: Secondary | ICD-10-CM | POA: Diagnosis present

## 2021-03-09 DIAGNOSIS — Z7901 Long term (current) use of anticoagulants: Secondary | ICD-10-CM

## 2021-03-09 DIAGNOSIS — Z86000 Personal history of in-situ neoplasm of breast: Secondary | ICD-10-CM

## 2021-03-09 DIAGNOSIS — M810 Age-related osteoporosis without current pathological fracture: Secondary | ICD-10-CM | POA: Diagnosis present

## 2021-03-09 DIAGNOSIS — K219 Gastro-esophageal reflux disease without esophagitis: Secondary | ICD-10-CM | POA: Diagnosis present

## 2021-03-09 DIAGNOSIS — Z86711 Personal history of pulmonary embolism: Secondary | ICD-10-CM

## 2021-03-09 DIAGNOSIS — N390 Urinary tract infection, site not specified: Secondary | ICD-10-CM

## 2021-03-09 HISTORY — DX: Gastro-esophageal reflux disease without esophagitis: K21.9

## 2021-03-09 HISTORY — DX: Diaphragmatic hernia without obstruction or gangrene: K44.9

## 2021-03-09 HISTORY — DX: Essential (primary) hypertension: I10

## 2021-03-09 HISTORY — DX: Scoliosis, unspecified: M41.9

## 2021-03-09 HISTORY — DX: Dysphagia, unspecified: R13.10

## 2021-03-09 HISTORY — DX: Age-related osteoporosis without current pathological fracture: M81.0

## 2021-03-09 HISTORY — DX: Intraductal carcinoma in situ of left breast: D05.12

## 2021-03-09 HISTORY — DX: Iron deficiency anemia, unspecified: D50.9

## 2021-03-09 HISTORY — DX: Other pulmonary embolism without acute cor pulmonale: I26.99

## 2021-03-09 HISTORY — DX: Urinary tract infection, site not specified: N39.0

## 2021-03-09 LAB — CBC WITH DIFFERENTIAL/PLATELET
Abs Immature Granulocytes: 0.03 10*3/uL (ref 0.00–0.07)
Basophils Absolute: 0 10*3/uL (ref 0.0–0.1)
Basophils Relative: 1 %
Eosinophils Absolute: 0 10*3/uL (ref 0.0–0.5)
Eosinophils Relative: 0 %
HCT: 29.8 % — ABNORMAL LOW (ref 36.0–46.0)
Hemoglobin: 9.2 g/dL — ABNORMAL LOW (ref 12.0–15.0)
Immature Granulocytes: 1 %
Lymphocytes Relative: 11 %
Lymphs Abs: 0.8 10*3/uL (ref 0.7–4.0)
MCH: 27.1 pg (ref 26.0–34.0)
MCHC: 30.9 g/dL (ref 30.0–36.0)
MCV: 87.9 fL (ref 80.0–100.0)
Monocytes Absolute: 0.5 10*3/uL (ref 0.1–1.0)
Monocytes Relative: 7 %
Neutro Abs: 5.3 10*3/uL (ref 1.7–7.7)
Neutrophils Relative %: 80 %
Platelets: 436 10*3/uL — ABNORMAL HIGH (ref 150–400)
RBC: 3.39 MIL/uL — ABNORMAL LOW (ref 3.87–5.11)
RDW: 17.2 % — ABNORMAL HIGH (ref 11.5–15.5)
WBC: 6.6 10*3/uL (ref 4.0–10.5)
nRBC: 0 % (ref 0.0–0.2)

## 2021-03-09 LAB — TROPONIN I (HIGH SENSITIVITY): Troponin I (High Sensitivity): 7 ng/L (ref <18)

## 2021-03-09 LAB — PROTIME-INR
INR: 3.8 — ABNORMAL HIGH (ref 0.8–1.2)
Prothrombin Time: 37.3 seconds — ABNORMAL HIGH (ref 11.4–15.2)

## 2021-03-09 MED ORDER — ONDANSETRON HCL 4 MG/2ML IJ SOLN
4.0000 mg | Freq: Once | INTRAMUSCULAR | Status: AC
  Administered 2021-03-09: 4 mg via INTRAVENOUS
  Filled 2021-03-09: qty 2

## 2021-03-09 MED ORDER — ONDANSETRON HCL 4 MG/2ML IJ SOLN
4.0000 mg | Freq: Once | INTRAMUSCULAR | Status: DC
  Filled 2021-03-09: qty 2

## 2021-03-09 MED ORDER — AMOXICILLIN-POT CLAVULANATE 875-125 MG PO TABS: 1.0000 | ORAL_TABLET | Freq: Two times a day (BID) | ORAL | 0 refills | Status: AC

## 2021-03-09 MED ORDER — MORPHINE SULFATE (PF) 2 MG/ML IV SOLN
2.0000 mg | Freq: Once | INTRAVENOUS | Status: DC
  Filled 2021-03-09: qty 1

## 2021-03-09 NOTE — ED Provider Notes (Signed)
MSE was initiated and I personally evaluated the patient and placed orders (if any) at  10:46 PM on March 09, 2021.  The patient appears stable so that the remainder of the MSE may be completed by another provider.   Vladimir Crofts, MD 03/09/21 [phone]

## 2021-03-09 NOTE — ED Notes (Signed)
ED Provider at bedside speaking with patient and family member- no acute changes; no vomiting noted.  Pt now on cardiac monitoring

## 2021-03-09 NOTE — Telephone Encounter (Signed)
Called pt informed her of the information below. RX sent in. Pt voiced understanding.  ?

## 2021-03-09 NOTE — ED Provider Notes (Signed)
[facility] Emergency Department Provider Note  ____________________________________________   Event Date/Time   First MD Initiated Contact with Patient 03/09/21 2259     (approximate)  I have reviewed the triage vital signs and the nursing notes.   HISTORY  Chief Complaint Abdominal Pain    HPI Kristina Rowe is a 87 y.o. female here with epigastric abdominal discomfort.  The patient states that she had fairly acute onset of severe, sharp, stabbing, epigastric abdominal pain this afternoon.  She was just resting at the time.  She had eaten several hours prior.  She states that she has not attempted to eat or swallow at the time.  She described as a sharp, stabbing pain in her epigastric area that has been constant.  Does not radiate.  No radiation to the back.  She has had some nausea and emesis.  No diarrhea.  She has been on multiple recent antibiotics for UTI, and is currently scheduled for an ultrasound for evaluation of stone but denies any flank pain.  Otherwise, no acute changes in health.  No shortness of breath.  No pain above the epigastric area.  No cough.  She does have a history of paraesophageal hernia status post repair, and states she has had some esophageal strictures related to this.  No other complaints.        History reviewed. No pertinent past medical history.  Patient Active Problem List   Diagnosis Date Noted  . Anemia, unspecified 03/06/2021    Past Surgical History:  Procedure Laterality Date  . BREAST LUMPECTOMY Left 2009   Ductal Carcinoma insitu     Prior to Admission medications   Medication Sig Start Date End Date Taking? Authorizing Provider  acetaminophen (TYLENOL) 500 MG tablet Take 500 mg by mouth at bedtime as needed.   Yes [provider]  amitriptyline (ELAVIL) 10 MG tablet Take 10 mg by mouth at bedtime.   Yes [provider]  amLODipine (NORVASC) 2.5 MG tablet Take 2.5 mg by mouth daily.  02/24/21  Yes [provider]  amoxicillin-clavulanate (AUGMENTIN) 875-125 MG tablet Take 1 tablet by mouth 2 (two) times daily for 5 days. 03/09/21 03/14/21 Yes Billey Co, MD  calcium carbonate (OS-CAL) 1250 (500 Ca) MG chewable tablet Chew 1 tablet by mouth daily. 04/20/09  Yes [provider]  Cholecalciferol 50 MCG (2000 UT) CAPS Take 1 capsule by mouth daily.   Yes [provider]  cyanocobalamin 1000 MCG tablet Take 1,000 mcg by mouth daily.   Yes [provider]  diclofenac Sodium (VOLTAREN) 1 % GEL Apply topically 4 (four) times daily.   Yes [provider]  econazole nitrate 1 % cream Apply 1 application topically daily.   Yes [provider]  estradiol (ESTRACE) 0.1 MG/GM vaginal cream Estrogen Cream Instruction Discard applicator Apply pea sized amount to tip of finger to urethra before bed. Wash hands well after application. Use Monday, Wednesday and Friday 03/04/21  Yes Billey Co, MD  folic acid (FOLVITE) 1 MG tablet TAKE 1 TABLET(1 MG) BY MOUTH DAILY 01/17/19  Yes [provider]  loperamide (IMODIUM) 2 MG capsule Take by mouth as needed for diarrhea or loose stools.   Yes [provider]  mirtazapine (REMERON) 30 MG tablet Take 30 mg by mouth at bedtime. 03/02/21  Yes [provider]  pantoprazole (PROTONIX) 40 MG tablet Take 40 mg by mouth daily.   Yes [provider]  promethazine (PHENERGAN) 25 MG  tablet Take 25 mg by mouth every 6 (six) hours as needed for nausea or vomiting.   Yes [provider]  SUMAtriptan (IMITREX) 50 MG tablet Take 1 at onset of headache. May repeat once in two hours. Do not exceed 2 per day or 4 per week. 03/15/19  Yes [provider]  tolterodine (DETROL LA) 4 MG 24 hr capsule Take 1 capsule (4 mg total) by mouth daily. Patient not taking: Reported on 03/10/2021 02/09/21   Billey Co, MD    Allergies Metronidazole, Omeprazole, Bactrim  [sulfamethoxazole-trimethoprim], and Codeine  Family History  Problem Relation Age of Onset  . Breast cancer Neg Hx     Social History Social History   Tobacco Use  . Smoking status: Never Smoker  . Smokeless tobacco: Never Used  Substance Use Topics  . Alcohol use: Never  . Drug use: Never    Review of Systems  Review of Systems  Constitutional: Positive for fatigue. Negative for chills and fever.  HENT: Negative for sore throat.   Respiratory: Negative for shortness of breath.   Cardiovascular: Positive for chest pain.  Gastrointestinal: Positive for abdominal pain, nausea and vomiting.  Genitourinary: Negative for flank pain.  Musculoskeletal: Negative for neck pain.  Skin: Negative for rash and wound.  Allergic/Immunologic: Negative for immunocompromised state.  Neurological: Negative for weakness and numbness.  Hematological: Does not bruise/bleed easily.  All other systems reviewed and are negative.    ____________________________________________  PHYSICAL EXAM:      VITAL SIGNS: ED Triage Vitals  Enc Vitals Group     BP 03/09/21 2229 132/83     Pulse Rate 03/09/21 2229 84     Resp 03/09/21 2229 16     Temp 03/09/21 2229 97.8 F (36.6 C)     Temp Source 03/09/21 2229 Oral     SpO2 03/09/21 2229 96 %     Weight 03/09/21 2228 90 lb (40.8 kg)     Height 03/09/21 2228 5\' 1"  (1.549 m)     Head Circumference --      Peak Flow --      Pain Score 03/09/21 2227 9     Pain Loc --      Pain Edu? --      Excl. in [place]? --      Physical Exam Vitals and nursing note reviewed.  Constitutional:      General: She is not in acute distress.    Appearance: She is well-developed.  HENT:     Head: Normocephalic and atraumatic.  Eyes:     Conjunctiva/sclera: Conjunctivae normal.  Cardiovascular:     Rate and Rhythm: Normal rate and regular rhythm.     Heart sounds: Normal heart sounds. No murmur heard. No friction rub.  Pulmonary:     Effort: Pulmonary effort is  normal. No respiratory distress.     Breath sounds: Normal breath sounds. No wheezing or rales.  Abdominal:     General: There is no distension.     Palpations: Abdomen is soft.     Tenderness: There is abdominal tenderness in the epigastric area.  Musculoskeletal:     Cervical back: Neck supple.  Skin:    General: Skin is warm.     Capillary Refill: Capillary refill takes less than 2 seconds.  Neurological:     Mental Status: She is alert and oriented to person, place, and time.     Motor: No abnormal muscle tone.       ____________________________________________  LABS (all labs ordered are listed, but only abnormal results are displayed)  Labs Reviewed  CBC WITH DIFFERENTIAL/PLATELET - Abnormal; Notable for the following components:      Result Value   RBC 3.39 (*)    Hemoglobin 9.2 (*)    HCT 29.8 (*)    RDW 17.2 (*)    Platelets 436 (*)    All other components within normal limits  COMPREHENSIVE METABOLIC PANEL - Abnormal; Notable for the following components:   Sodium 134 (*)    Glucose, Bld 111 (*)    Albumin 3.4 (*)    All other components within normal limits  URINALYSIS, COMPLETE (UACMP) WITH MICROSCOPIC - Abnormal; Notable for the following components:   Color, Urine YELLOW (*)    APPearance CLOUDY (*)    Hgb urine dipstick SMALL (*)    Nitrite POSITIVE (*)    Leukocytes,Ua LARGE (*)    WBC, UA >50 (*)    Bacteria, UA RARE (*)    All other components within normal limits  PROTIME-INR - Abnormal; Notable for the following components:   Prothrombin Time 37.3 (*)    INR 3.8 (*)    All other components within normal limits  APTT - Abnormal; Notable for the following components:   aPTT 58 (*)    All other components within normal limits  RESP PANEL BY RT-PCR (FLU A&B, COVID) ARPGX2  LIPASE, BLOOD  LACTIC ACID, PLASMA  LACTIC ACID, PLASMA  TROPONIN I (HIGH SENSITIVITY)  TROPONIN I (HIGH SENSITIVITY)     ____________________________________________  EKG: Normal sinus rhythm, ventricular rate 85.  PR 219, QRS 93, QTc 425.  No acute ST elevations or depressions.  EKG evidence of acute ischemia or infarct. ________________________________________  RADIOLOGY All imaging, including plain films, CT scans, and ultrasounds, independently reviewed by me, and interpretations confirmed via formal radiology reads.  ED MD interpretation:   Chest x-ray: Mild eventration of the right hemidiaphragm, gaseous distention of colon  Official radiology report(s): DG Chest 2 View  Result Date: 03/09/2021 CLINICAL DATA:  Worsening epigastric and chest pain. Nausea and heaving. History of hiatal hernia. EXAM: CHEST - 2 VIEW COMPARISON:  None. FINDINGS: Eventration of right hemidiaphragm. Heart size difficult to assess, but likely upper normal. Aortic atherosclerosis and tortuosity. Mild atelectasis or scarring at the right lung base adjacent to elevated hemidiaphragm. No obvious hiatal hernia on the current exam. No acute airspace disease. No pleural effusion. Bones are diffusely under mineralized. Scoliotic curvature. Limited assessment of the mid lower thoracic spine. No obvious acute osseous abnormality. There is gaseous distention of colon under the right hemidiaphragm IMPRESSION: 1. Mild eventration of right hemidiaphragm with adjacent basilar atelectasis or scarring. No evidence of acute airspace disease. 2. Aortic atherosclerosis and tortuosity. 3. Gaseous distention of colon under the right hemidiaphragm, nonspecific. Electronically Signed   By: Keith Rake M.D.   On: 03/09/2021 23:51   CT ABDOMEN PELVIS W CONTRAST  Result Date: 03/10/2021 CLINICAL DATA:  Abdominal pain. EXAM: CT ABDOMEN AND PELVIS WITH CONTRAST TECHNIQUE: Multidetector CT imaging of the abdomen and pelvis was performed using the standard protocol following bolus administration of intravenous contrast. CONTRAST:  121mL OMNIPAQUE IOHEXOL  300 MG/ML  SOLN COMPARISON:  None. FINDINGS: Lower chest: Mild atelectasis is seen within the posterior aspect of the left lung base. Hepatobiliary: No focal liver abnormality is seen. No gallstones, gallbladder wall thickening, or biliary dilatation. Pancreas: Unremarkable. No pancreatic ductal dilatation or surrounding inflammatory changes. Spleen: Normal in size without focal abnormality.  Adrenals/Urinary Tract: Adrenal glands are unremarkable. Kidneys are normal in size, without renal calculi or hydronephrosis. A 0.9 cm cystic appearing areas seen within the upper pole of the right kidney. The urinary bladder is moderately distended. Stomach/Bowel: There is a large hiatal hernia with a moderate amount of adjacent fluid noted. The body of the stomach is elongated and extends inferiorly into the mid to lower right abdomen. This may be congenital in nature. Appendix appears normal. No evidence of bowel wall dilatation. Diverticula are seen throughout the large bowel. Twisting of the mesentery is seen within the midline of the upper abdomen on coronal images 21 through 35, CT series number 5). Vascular/Lymphatic: Mild aortic atherosclerosis with marked severity tortuosity of the abdominal aorta. Twisting of the mesentery and associated mesenteric vasculature is seen within the midline of the upper abdomen (best seen on coronal reformatted images 21-35, CT series number 5). Partial occlusion of the mesenteric veins is seen within this area. No enlarged abdominal or pelvic lymph nodes. Reproductive: Uterus and bilateral adnexa are unremarkable. Other: No abdominal wall hernia or abnormality. A mild amount of fluid is seen within the abdomen, with a mild-to-moderate amount of posterior pelvic free fluid also seen. Musculoskeletal: There is marked severity levoscoliosis of the lumbar spine. IMPRESSION: 1. Twisting of the mesentery and associated vasculature, as described above, consistent with a volvulus. 2.   Large  hiatal hernia and surrounding fluid. 3.   Colonic diverticulosis. 4.   Marked severity levoscoliosis of the lumbar spine. 5.   Mild ascites. 6. Aortic atherosclerosis. Aortic Atherosclerosis (ICD10-I70.0). Electronically Signed   By: Virgina Norfolk M.D.   On: 03/10/2021 01:25    ____________________________________________  PROCEDURES   Procedure(s) performed (including Critical Care):  .Critical Care Performed by: Duffy Bruce, MD Authorized by: Duffy Bruce, MD   Critical care provider statement:    Critical care time (minutes):  35   Critical care time was exclusive of:  Separately billable procedures and treating other patients and teaching time   Critical care was necessary to treat or prevent imminent or life-threatening deterioration of the following conditions:  Cardiac failure, circulatory failure, sepsis and respiratory failure   Critical care was time spent personally by me on the following activities:  Development of treatment plan with patient or surrogate, discussions with consultants, evaluation of patient's response to treatment, examination of patient, obtaining history from patient or surrogate, ordering and performing treatments and interventions, ordering and review of laboratory studies, ordering and review of radiographic studies, pulse oximetry, re-evaluation of patient's condition and review of old charts   I assumed direction of critical care for this patient from another provider in my specialty: no      ____________________________________________  INITIAL IMPRESSION / MDM / [place] / ED COURSE  As part of my medical decision making, I reviewed the following data within the [place] notes reviewed and incorporated, Old chart reviewed, Notes from prior ED visits, and [place]rolled Substance [place] was evaluated in Emergency Department on 03/10/2021 for the symptoms described in the history  of present illness. She was evaluated in the context of the global COVID-19 pandemic, which necessitated consideration that the patient might be at risk for infection with the SARS-CoV-2 virus that causes COVID-19. Institutional protocols and algorithms that pertain to the evaluation of patients at risk for COVID-19 are in a state of rapid change based on information released by regulatory bodies including  the [employer] and federal and state organizations. These policies and algorithms were followed during the patient's care in the ED.  Some ED evaluations and interventions may be delayed as a result of limited staffing during the pandemic.*     Medical Decision Making: 87 year old female here with severe abdominal pain.  Patient does appear in distress on arrival.  She is hemodynamically stable.  White count normal.  Hemoglobin 9.2.  CMP unremarkable.  Lactic acid normal.  INR 3.8, she is on Coumadin for history of DVT/PE.  CT abdomen/pelvis obtained, reviewed, and is concerning for acute volvulus.  Chest x-ray is clear.  Dr. Hampton Abbot of surgery immediately paged and will take the patient to the operating room.  Kcentra given for Coumadin reversal.  Patient given IV Zosyn for empiric antibiotic coverage as well as fluids.  Will admit to the hospital after surgery.  Patient updated and is in agreement.  Covid sent and is pending.  ____________________________________________  FINAL CLINICAL IMPRESSION(S) / ED DIAGNOSES  Final diagnoses:  Volvulus ([place])     MEDICATIONS GIVEN DURING THIS VISIT:  Medications  acetaminophen (TYLENOL) tablet 1,000 mg (1,000 mg Oral Not Given 03/10/21 0204)  alum & mag hydroxide-simeth (MAALOX/MYLANTA) 200-200-20 MG/5ML suspension 30 mL (30 mLs Oral Not Given 03/10/21 0204)    And  lidocaine (XYLOCAINE) 2 % viscous mouth solution 15 mL (15 mLs Oral Not Given 03/10/21 0204)  ondansetron (ZOFRAN) injection 4 mg (4 mg Intravenous Given 03/09/21 2312)  iohexol (OMNIPAQUE) 300  MG/ML solution 100 mL (100 mLs Intravenous Contrast Given 03/10/21 0039)  morphine 2 MG/ML injection 2 mg (2 mg Intravenous Given 03/10/21 0133)  ondansetron (ZOFRAN) injection 4 mg (4 mg Intravenous Given 03/10/21 0133)  piperacillin-tazobactam (ZOSYN) IVPB 3.375 g (0 g Intravenous Stopped 03/10/21 0229)  prothrombin complex conc human (KCENTRA) IVPB 1,580 Units (1,580 Units Intravenous New Bag/Given 03/10/21 0235)  phytonadione (VITAMIN K) 10 mg in dextrose 5 % 50 mL IVPB (10 mg Intravenous New Bag/Given 03/10/21 0236)  morphine 2 MG/ML injection 2 mg (2 mg Intravenous Given 03/10/21 0249)     ED Discharge Orders    None       Note:  This document was prepared using Dragon voice recognition software and may include unintentional dictation errors.   Duffy Bruce, MD 03/10/21 [phone]

## 2021-03-09 NOTE — Telephone Encounter (Signed)
Incoming call from pt on triage line who states that last night she took 4mg  Tolterodine and approximately 4 hours later felt that she was unable to void. She experienced a lot of pelvic pressure and discomfort. She was finally able to void about 4am this morning. She has been able to void several times since then, stream is weak like a dribble, however she states stream has been an ongoing issue. She wonders if this possibly a UTI as she is still awaiting urine cx results. Patient was advised to discontinue Tolterodine immediately. Please advise.

## 2021-03-09 NOTE — ED Triage Notes (Addendum)
Pt states she is having abd pain in the middle of her stomach that started earlier today. Pt states she is currently being treated for bladder infection and is on her third round of abx. Pt states she is having n/v

## 2021-03-09 NOTE — Telephone Encounter (Signed)
Agree with stopping tolterodine.  Urine culture is growing bacteria, but sensitivities pending.  Lets do Augmentin 875-125 twice daily x5 days, and will call with sensitivities, thanks  Nickolas Madrid, MD 03/09/2021

## 2021-03-09 NOTE — ED Notes (Signed)
Pt awake and alert lying in bed- GCS 15.   Daughter at bedside.  Pt reports recurrent UTIs - most recent diagnosed by urology after urine collected same day of cystoscopy -- per urology office today pt to start on Augmentin however urology office also advised that "several strains" detected.  Pt also c/o urinary retention, nausea - no vomiting just dry heaving and epigastric pain radiating to the general abdominal region; pt states pain feels like cramping and reports h/o hiatal hernia it could be attributed to.  Plan for UA and labs at this time per Dr Dr D Smith's orders.  Cardiac monitoring also to be initiated per provider.  Will continue to monitor pt for acute changes and implement and maintain plan of care.

## 2021-03-10 ENCOUNTER — Emergency Department: Payer: Medicare Other

## 2021-03-10 ENCOUNTER — Encounter
Admission: EM | Disposition: A | Discharge status: Home / Self Care | Payer: Self-pay | Source: Home / Self Care | Attending: Surgery

## 2021-03-10 ENCOUNTER — Inpatient Hospital Stay: Payer: Medicare Other | Admitting: Anesthesiology

## 2021-03-10 ENCOUNTER — Encounter: Payer: Self-pay | Admitting: Radiology

## 2021-03-10 DIAGNOSIS — Z20822 Contact with and (suspected) exposure to covid-19: Secondary | ICD-10-CM | POA: Diagnosis present

## 2021-03-10 DIAGNOSIS — K565 Intestinal adhesions [bands], unspecified as to partial versus complete obstruction: Secondary | ICD-10-CM | POA: Diagnosis not present

## 2021-03-10 DIAGNOSIS — Z86000 Personal history of in-situ neoplasm of breast: Secondary | ICD-10-CM | POA: Diagnosis not present

## 2021-03-10 DIAGNOSIS — Z86711 Personal history of pulmonary embolism: Secondary | ICD-10-CM | POA: Diagnosis not present

## 2021-03-10 DIAGNOSIS — I10 Essential (primary) hypertension: Secondary | ICD-10-CM | POA: Diagnosis present

## 2021-03-10 DIAGNOSIS — K562 Volvulus: Principal | ICD-10-CM

## 2021-03-10 DIAGNOSIS — K660 Peritoneal adhesions (postprocedural) (postinfection): Secondary | ICD-10-CM | POA: Diagnosis present

## 2021-03-10 DIAGNOSIS — M810 Age-related osteoporosis without current pathological fracture: Secondary | ICD-10-CM | POA: Diagnosis present

## 2021-03-10 DIAGNOSIS — Z79899 Other long term (current) drug therapy: Secondary | ICD-10-CM | POA: Diagnosis not present

## 2021-03-10 DIAGNOSIS — R791 Abnormal coagulation profile: Secondary | ICD-10-CM | POA: Diagnosis present

## 2021-03-10 DIAGNOSIS — Z7901 Long term (current) use of anticoagulants: Secondary | ICD-10-CM | POA: Diagnosis not present

## 2021-03-10 DIAGNOSIS — D6489 Other specified anemias: Secondary | ICD-10-CM | POA: Diagnosis present

## 2021-03-10 DIAGNOSIS — K219 Gastro-esophageal reflux disease without esophagitis: Secondary | ICD-10-CM | POA: Diagnosis present

## 2021-03-10 HISTORY — PX: LAPAROTOMY: SHX154

## 2021-03-10 LAB — URINALYSIS, COMPLETE (UACMP) WITH MICROSCOPIC
Bilirubin Urine: NEGATIVE
Glucose, UA: NEGATIVE mg/dL
Ketones, ur: NEGATIVE mg/dL
Nitrite: POSITIVE — AB
Protein, ur: NEGATIVE mg/dL
Specific Gravity, Urine: 1.029 (ref 1.005–1.030)
WBC, UA: >50 WBC/hpf — ABNORMAL HIGH (ref 0–5)
pH: 5 (ref 5.0–8.0)

## 2021-03-10 LAB — RESP PANEL BY RT-PCR (FLU A&B, COVID) ARPGX2
Influenza A by PCR: NEGATIVE
Influenza B by PCR: NEGATIVE
SARS Coronavirus 2 by RT PCR: NEGATIVE

## 2021-03-10 LAB — COMPREHENSIVE METABOLIC PANEL
ALT: 14 U/L (ref 0–44)
AST: 25 U/L (ref 15–41)
Albumin: 3.4 g/dL — ABNORMAL LOW (ref 3.5–5.0)
Alkaline Phosphatase: 84 U/L (ref 38–126)
Anion gap: 6 (ref 5–15)
BUN: 22 mg/dL (ref 8–23)
CO2: 29 mmol/L (ref 22–32)
Calcium: 9 mg/dL (ref 8.9–10.3)
Chloride: 99 mmol/L (ref 98–111)
Creatinine, Ser: 0.89 mg/dL (ref 0.44–1.00)
GFR, Estimated: >60 mL/min (ref >60)
Glucose, Bld: 111 mg/dL — ABNORMAL HIGH (ref 70–99)
Potassium: 4.4 mmol/L (ref 3.5–5.1)
Sodium: 134 mmol/L — ABNORMAL LOW (ref 135–145)
Total Bilirubin: 0.5 mg/dL (ref 0.3–1.2)
Total Protein: 6.5 g/dL (ref 6.5–8.1)

## 2021-03-10 LAB — LACTIC ACID, PLASMA
Lactic Acid, Venous: 0.9 mmol/L (ref 0.5–1.9)
Lactic Acid, Venous: 1.1 mmol/L (ref 0.5–1.9)

## 2021-03-10 LAB — TYPE AND SCREEN
ABO/RH(D): O NEG
Antibody Screen: NEGATIVE

## 2021-03-10 LAB — LIPASE, BLOOD: Lipase: 24 U/L (ref 11–51)

## 2021-03-10 LAB — APTT: aPTT: 58 seconds — ABNORMAL HIGH (ref 24–36)

## 2021-03-10 LAB — TROPONIN I (HIGH SENSITIVITY): Troponin I (High Sensitivity): 7 ng/L (ref <18)

## 2021-03-10 LAB — PROTIME-INR
INR: 1.2 (ref 0.8–1.2)
Prothrombin Time: 15.6 seconds — ABNORMAL HIGH (ref 11.4–15.2)

## 2021-03-10 SURGERY — LAPAROTOMY, EXPLORATORY
Anesthesia: General | Site: Abdomen

## 2021-03-10 MED ORDER — LIDOCAINE HCL (PF) 2 % IJ SOLN
INTRAMUSCULAR | Status: AC
  Filled 2021-03-10: qty 5

## 2021-03-10 MED ORDER — LIDOCAINE VISCOUS HCL 2 % MT SOLN
15.0000 mL | Freq: Once | OROMUCOSAL | Status: DC
  Filled 2021-03-10: qty 15

## 2021-03-10 MED ORDER — VASOPRESSIN 20 UNIT/ML IV SOLN
INTRAVENOUS | Status: DC | PRN
  Administered 2021-03-10 (×2): 1 [IU] via INTRAVENOUS

## 2021-03-10 MED ORDER — EPHEDRINE 5 MG/ML INJ
INTRAVENOUS | Status: AC
  Filled 2021-03-10: qty 20

## 2021-03-10 MED ORDER — ONDANSETRON HCL 4 MG/2ML IJ SOLN
INTRAMUSCULAR | Status: DC | PRN
  Administered 2021-03-10: 4 mg via INTRAVENOUS

## 2021-03-10 MED ORDER — ALBUMIN HUMAN 5 % IV SOLN: INTRAVENOUS | Status: DC | PRN

## 2021-03-10 MED ORDER — PIPERACILLIN-TAZOBACTAM 3.375 G IVPB
INTRAVENOUS | Status: AC
  Administered 2021-03-10: 3.375 g via INTRAVENOUS
  Filled 2021-03-10: qty 50

## 2021-03-10 MED ORDER — SUCCINYLCHOLINE CHLORIDE 20 MG/ML IJ SOLN
INTRAMUSCULAR | Status: DC | PRN
  Administered 2021-03-10: 80 mg via INTRAVENOUS

## 2021-03-10 MED ORDER — SUGAMMADEX SODIUM 200 MG/2ML IV SOLN
INTRAVENOUS | Status: DC | PRN
  Administered 2021-03-10: 100 mg via INTRAVENOUS

## 2021-03-10 MED ORDER — 0.9 % SODIUM CHLORIDE (POUR BTL) OPTIME
TOPICAL | Status: DC | PRN
  Administered 2021-03-10: 2100 mL

## 2021-03-10 MED ORDER — LIDOCAINE HCL URETHRAL/MUCOSAL 2 % EX GEL
CUTANEOUS | Status: AC
  Filled 2021-03-10: qty 5

## 2021-03-10 MED ORDER — AMLODIPINE BESYLATE 5 MG PO TABS
2.5000 mg | ORAL_TABLET | Freq: Every day | ORAL | Status: DC
  Administered 2021-03-11 – 2021-03-14 (×4): 2.5 mg via ORAL
  Filled 2021-03-10 (×2): qty 1
  Filled 2021-03-10: qty 0.5
  Filled 2021-03-10 (×2): qty 1

## 2021-03-10 MED ORDER — ACETAMINOPHEN 500 MG PO TABS
1000.0000 mg | ORAL_TABLET | Freq: Four times a day (QID) | ORAL | Status: DC
  Administered 2021-03-10 – 2021-03-14 (×13): 1000 mg via ORAL
  Filled 2021-03-10 (×14): qty 2

## 2021-03-10 MED ORDER — PROPOFOL 10 MG/ML IV BOLUS
INTRAVENOUS | Status: AC
  Filled 2021-03-10: qty 20

## 2021-03-10 MED ORDER — PIPERACILLIN-TAZOBACTAM 3.375 G IVPB
3.3750 g | Freq: Three times a day (TID) | INTRAVENOUS | Status: AC
  Administered 2021-03-10 – 2021-03-12 (×7): 3.375 g via INTRAVENOUS
  Filled 2021-03-10 (×5): qty 50

## 2021-03-10 MED ORDER — PIPERACILLIN-TAZOBACTAM 3.375 G IVPB 30 MIN
3.3750 g | Freq: Once | INTRAVENOUS | Status: AC
  Administered 2021-03-10: 3.375 g via INTRAVENOUS
  Filled 2021-03-10: qty 50

## 2021-03-10 MED ORDER — PIPERACILLIN-TAZOBACTAM 3.375 G IVPB: 3.3750 g | Freq: Three times a day (TID) | INTRAVENOUS | Status: DC

## 2021-03-10 MED ORDER — PANTOPRAZOLE SODIUM 40 MG IV SOLR
40.0000 mg | Freq: Every day | INTRAVENOUS | Status: DC
  Administered 2021-03-10 – 2021-03-13 (×4): 40 mg via INTRAVENOUS
  Filled 2021-03-10 (×4): qty 40

## 2021-03-10 MED ORDER — SODIUM CHLORIDE 0.9 % IV SOLN: INTRAVENOUS | Status: DC

## 2021-03-10 MED ORDER — EPHEDRINE SULFATE 50 MG/ML IJ SOLN
INTRAMUSCULAR | Status: DC | PRN
  Administered 2021-03-10 (×2): 10 mg via INTRAVENOUS

## 2021-03-10 MED ORDER — BUPIVACAINE-EPINEPHRINE (PF) 0.25% -1:200000 IJ SOLN
INTRAMUSCULAR | Status: AC
  Filled 2021-03-10: qty 30

## 2021-03-10 MED ORDER — MORPHINE SULFATE (PF) 2 MG/ML IV SOLN
2.0000 mg | Freq: Once | INTRAVENOUS | Status: AC
  Administered 2021-03-10: 2 mg via INTRAVENOUS
  Filled 2021-03-10: qty 1

## 2021-03-10 MED ORDER — ROCURONIUM BROMIDE 100 MG/10ML IV SOLN
INTRAVENOUS | Status: DC | PRN
  Administered 2021-03-10: 20 mg via INTRAVENOUS
  Administered 2021-03-10: 5 mg via INTRAVENOUS
  Administered 2021-03-10: 10 mg via INTRAVENOUS

## 2021-03-10 MED ORDER — BUPIVACAINE LIPOSOME 1.3 % IJ SUSP
INTRAMUSCULAR | Status: AC
  Filled 2021-03-10: qty 20

## 2021-03-10 MED ORDER — ACETAMINOPHEN 500 MG PO TABS
1000.0000 mg | ORAL_TABLET | Freq: Once | ORAL | Status: DC
  Filled 2021-03-10: qty 2

## 2021-03-10 MED ORDER — KETOROLAC TROMETHAMINE 15 MG/ML IJ SOLN
15.0000 mg | Freq: Four times a day (QID) | INTRAMUSCULAR | Status: DC
  Administered 2021-03-10 – 2021-03-11 (×3): 15 mg via INTRAVENOUS
  Filled 2021-03-10 (×4): qty 1

## 2021-03-10 MED ORDER — ACETAMINOPHEN 10 MG/ML IV SOLN
INTRAVENOUS | Status: AC
  Filled 2021-03-10: qty 100

## 2021-03-10 MED ORDER — SODIUM CHLORIDE 0.9 % IV SOLN: Freq: Once | INTRAVENOUS | Status: AC

## 2021-03-10 MED ORDER — PROPOFOL 10 MG/ML IV BOLUS
INTRAVENOUS | Status: DC | PRN
  Administered 2021-03-10: 60 mg via INTRAVENOUS

## 2021-03-10 MED ORDER — SUCCINYLCHOLINE CHLORIDE 200 MG/10ML IV SOSY
PREFILLED_SYRINGE | INTRAVENOUS | Status: AC
  Filled 2021-03-10: qty 10

## 2021-03-10 MED ORDER — ONDANSETRON HCL 4 MG/2ML IJ SOLN
4.0000 mg | Freq: Once | INTRAMUSCULAR | Status: AC
  Administered 2021-03-10: 4 mg via INTRAVENOUS
  Filled 2021-03-10: qty 2

## 2021-03-10 MED ORDER — BUPIVACAINE-EPINEPHRINE (PF) 0.25% -1:200000 IJ SOLN
INTRAMUSCULAR | Status: DC | PRN
  Administered 2021-03-10: 30 mL

## 2021-03-10 MED ORDER — ALBUMIN HUMAN 5 % IV SOLN
INTRAVENOUS | Status: AC
  Filled 2021-03-10: qty 250

## 2021-03-10 MED ORDER — SODIUM CHLORIDE (PF) 0.9 % IJ SOLN
INTRAMUSCULAR | Status: AC
  Filled 2021-03-10: qty 20

## 2021-03-10 MED ORDER — ACETAMINOPHEN 10 MG/ML IV SOLN
INTRAVENOUS | Status: DC | PRN
  Administered 2021-03-10: 500 mg via INTRAVENOUS

## 2021-03-10 MED ORDER — PHENYLEPHRINE HCL (PRESSORS) 10 MG/ML IV SOLN
INTRAVENOUS | Status: DC | PRN
  Administered 2021-03-10 (×3): 100 ug via INTRAVENOUS

## 2021-03-10 MED ORDER — BUPIVACAINE LIPOSOME 1.3 % IJ SUSP
INTRAMUSCULAR | Status: DC | PRN
  Administered 2021-03-10: 20 mL

## 2021-03-10 MED ORDER — MORPHINE SULFATE (PF) 2 MG/ML IV SOLN: 1.0000 mg | INTRAVENOUS | Status: DC | PRN

## 2021-03-10 MED ORDER — ACETAMINOPHEN 10 MG/ML IV SOLN: INTRAVENOUS | Status: DC | PRN

## 2021-03-10 MED ORDER — FENTANYL CITRATE (PF) 100 MCG/2ML IJ SOLN
25.0000 ug | INTRAMUSCULAR | Status: DC | PRN
  Administered 2021-03-10 (×3): 25 ug via INTRAVENOUS

## 2021-03-10 MED ORDER — VITAMIN K1 10 MG/ML IJ SOLN
10.0000 mg | INTRAVENOUS | Status: AC
  Administered 2021-03-10: 10 mg via INTRAVENOUS
  Filled 2021-03-10: qty 1

## 2021-03-10 MED ORDER — ONDANSETRON HCL 4 MG/2ML IJ SOLN: 4.0000 mg | Freq: Four times a day (QID) | INTRAMUSCULAR | Status: DC | PRN

## 2021-03-10 MED ORDER — FENTANYL CITRATE (PF) 100 MCG/2ML IJ SOLN
INTRAMUSCULAR | Status: DC | PRN
  Administered 2021-03-10: 25 ug via INTRAVENOUS

## 2021-03-10 MED ORDER — ONDANSETRON HCL 4 MG/2ML IJ SOLN: 4.0000 mg | Freq: Once | INTRAMUSCULAR | Status: DC | PRN

## 2021-03-10 MED ORDER — KETOROLAC TROMETHAMINE 15 MG/ML IJ SOLN
INTRAMUSCULAR | Status: AC
  Filled 2021-03-10: qty 1

## 2021-03-10 MED ORDER — FENTANYL CITRATE (PF) 100 MCG/2ML IJ SOLN
INTRAMUSCULAR | Status: AC
  Filled 2021-03-10: qty 2

## 2021-03-10 MED ORDER — CHLORHEXIDINE GLUCONATE CLOTH 2 % EX PADS
6.0000 | MEDICATED_PAD | Freq: Every day | CUTANEOUS | Status: DC
  Administered 2021-03-10 – 2021-03-13 (×3): 6 via TOPICAL

## 2021-03-10 MED ORDER — LACTATED RINGERS IV SOLN: INTRAVENOUS | Status: DC | PRN

## 2021-03-10 MED ORDER — SODIUM CHLORIDE 0.9 % IV SOLN
INTRAVENOUS | Status: DC | PRN
  Administered 2021-03-10: 40 ug/min via INTRAVENOUS
  Administered 2021-03-10: 30 ug/min via INTRAVENOUS

## 2021-03-10 MED ORDER — IOHEXOL 300 MG/ML  SOLN
100.0000 mL | Freq: Once | INTRAMUSCULAR | Status: AC | PRN
  Administered 2021-03-10: 100 mL via INTRAVENOUS

## 2021-03-10 MED ORDER — SODIUM CHLORIDE 0.9 % IV BOLUS: 500.0000 mL | Freq: Once | INTRAVENOUS | Status: DC

## 2021-03-10 MED ORDER — VASOPRESSIN 20 UNIT/ML IV SOLN
INTRAVENOUS | Status: AC
  Filled 2021-03-10: qty 1

## 2021-03-10 MED ORDER — FENTANYL CITRATE (PF) 100 MCG/2ML IJ SOLN
INTRAMUSCULAR | Status: AC
  Administered 2021-03-10: 25 ug via INTRAVENOUS
  Filled 2021-03-10: qty 2

## 2021-03-10 MED ORDER — MIRTAZAPINE 15 MG PO TABS
30.0000 mg | ORAL_TABLET | Freq: Every day | ORAL | Status: DC
  Administered 2021-03-10 – 2021-03-13 (×4): 30 mg via ORAL
  Filled 2021-03-10 (×4): qty 2

## 2021-03-10 MED ORDER — PROTHROMBIN COMPLEX CONC HUMAN 500 UNITS IV KIT
1580.0000 [IU] | PACK | Status: AC
  Administered 2021-03-10: 1580 [IU] via INTRAVENOUS
  Filled 2021-03-10: qty 1080

## 2021-03-10 MED ORDER — ROCURONIUM BROMIDE 10 MG/ML (PF) SYRINGE
PREFILLED_SYRINGE | INTRAVENOUS | Status: AC
  Filled 2021-03-10: qty 10

## 2021-03-10 MED ORDER — ALUM & MAG HYDROXIDE-SIMETH 200-200-20 MG/5ML PO SUSP
30.0000 mL | Freq: Once | ORAL | Status: DC
  Filled 2021-03-10: qty 30

## 2021-03-10 MED ORDER — AMITRIPTYLINE HCL 10 MG PO TABS
10.0000 mg | ORAL_TABLET | Freq: Every day | ORAL | Status: DC
  Administered 2021-03-10 – 2021-03-13 (×4): 10 mg via ORAL
  Filled 2021-03-10 (×5): qty 1

## 2021-03-10 MED ORDER — ONDANSETRON 4 MG PO TBDP: 4.0000 mg | ORAL_TABLET | Freq: Four times a day (QID) | ORAL | Status: DC | PRN

## 2021-03-10 SURGICAL SUPPLY — 44 items
APL PRP STRL LF DISP 70% ISPRP (MISCELLANEOUS) ×1
BRR ADH 6X5 SEPRAFILM 1 SHT (MISCELLANEOUS)
BULB RESERV EVAC DRAIN JP 100C (MISCELLANEOUS) ×2 IMPLANT
CANISTER SUCT 1200ML W/VALVE (MISCELLANEOUS) ×2 IMPLANT
CHLORAPREP W/TINT 26 (MISCELLANEOUS) ×2 IMPLANT
COVER WAND RF STERILE (DRAPES) ×2 IMPLANT
DRAIN CHANNEL JP 19F (MISCELLANEOUS) ×2 IMPLANT
DRAPE LAPAROTOMY 100X77 ABD (DRAPES) ×2 IMPLANT
DRSG OPSITE POSTOP 4X12 (GAUZE/BANDAGES/DRESSINGS) IMPLANT
DRSG TEGADERM 4X10 (GAUZE/BANDAGES/DRESSINGS) IMPLANT
DRSG TEGADERM 4X4.75 (GAUZE/BANDAGES/DRESSINGS) ×2 IMPLANT
ELECT BLADE 6.5 EXT (BLADE) ×2 IMPLANT
ELECT CAUTERY BLADE 6.4 (BLADE) ×2 IMPLANT
ELECT REM PT RETURN 9FT ADLT (ELECTROSURGICAL) ×2
ELECTRODE REM PT RTRN 9FT ADLT (ELECTROSURGICAL) ×1 IMPLANT
GAUZE SPONGE 4X4 12PLY STRL (GAUZE/BANDAGES/DRESSINGS) ×2 IMPLANT
GLOVE SURG SYN 7.0 (GLOVE) ×4 IMPLANT
GLOVE SURG SYN 7.5  E (GLOVE) ×2
GLOVE SURG SYN 7.5 E (GLOVE) ×2 IMPLANT
GOWN STRL REUS W/ TWL LRG LVL3 (GOWN DISPOSABLE) ×4 IMPLANT
GOWN STRL REUS W/TWL LRG LVL3 (GOWN DISPOSABLE) ×8
LABEL OR SOLS (LABEL) ×2 IMPLANT
LIGASURE IMPACT 36 18CM CVD LR (INSTRUMENTS) IMPLANT
MANIFOLD NEPTUNE II (INSTRUMENTS) ×2 IMPLANT
NEEDLE HYPO 22GX1.5 SAFETY (NEEDLE) ×2 IMPLANT
NS IRRIG 1000ML POUR BTL (IV SOLUTION) ×6 IMPLANT
NS IRRIG 500ML POUR BTL (IV SOLUTION) ×2 IMPLANT
PACK BASIN MAJOR ARMC (MISCELLANEOUS) ×2 IMPLANT
PACK COLON CLEAN CLOSURE (MISCELLANEOUS) IMPLANT
SEPRAFILM MEMBRANE 5X6 (MISCELLANEOUS) IMPLANT
SPONGE VERSALON 4X4 4PLY (MISCELLANEOUS) ×2 IMPLANT
STAPLER SKIN PROX 35W (STAPLE) ×2 IMPLANT
SUT ETHILON 3-0 FS-10 30 BLK (SUTURE) ×2
SUT PDS AB 1 CT1 36 (SUTURE) ×4 IMPLANT
SUT PROLENE 2 0 SH DA (SUTURE) IMPLANT
SUT SILK 2 0 (SUTURE)
SUT SILK 2-0 18XBRD TIE 12 (SUTURE) IMPLANT
SUT SILK 3-0 (SUTURE) ×2 IMPLANT
SUT VIC AB 3-0 SH 27 (SUTURE)
SUT VIC AB 3-0 SH 27X BRD (SUTURE) IMPLANT
SUTURE EHLN 3-0 FS-10 30 BLK (SUTURE) ×1 IMPLANT
SYR 10ML LL (SYRINGE) ×2 IMPLANT
SYR 20ML LL LF (SYRINGE) IMPLANT
TRAY FOLEY MTR SLVR 16FR STAT (SET/KITS/TRAYS/PACK) ×2 IMPLANT

## 2021-03-10 NOTE — Anesthesia Preprocedure Evaluation (Addendum)
Anesthesia Evaluation  Patient identified by MRN, date of birth, ID band Patient awake    Reviewed: Allergy & Precautions, NPO status , Patient's Chart, lab work & pertinent test results  Airway Mallampati: III       Dental   Pulmonary PE   Pulmonary exam normal        Cardiovascular hypertension, Normal cardiovascular exam     Neuro/Psych negative neurological ROS  negative psych ROS   GI/Hepatic Neg liver ROS, hiatal hernia, GERD  ,  Endo/Other  negative endocrine ROS  Renal/GU negative Renal ROS  negative genitourinary   Musculoskeletal   Abdominal Normal abdominal exam  (+)   Peds negative pediatric ROS (+)  Hematology  (+) anemia ,   Anesthesia Other Findings   Reproductive/Obstetrics                             Anesthesia Physical Anesthesia Plan  ASA: III and emergent  Anesthesia Plan: General   Post-op Pain Management:    Induction: Intravenous, Rapid sequence and Cricoid pressure planned  PONV Risk Score and Plan:   Airway Management Planned: Oral ETT and Video Laryngoscope Planned  Additional Equipment:   Intra-op Plan:   Post-operative Plan: Extubation in OR and Possible Post-op intubation/ventilation  Informed Consent: I have reviewed the patients History and Physical, chart, labs and discussed the procedure including the risks, benefits and alternatives for the proposed anesthesia with the patient or authorized representative who has indicated his/her understanding and acceptance.     Dental advisory given  Plan Discussed with: CRNA and Surgeon  Anesthesia Plan Comments:         Anesthesia Quick Evaluation

## 2021-03-10 NOTE — H&P (Signed)
Date of Admission:  03/10/2021  Reason for Admission:  Mesenteric Volvulus  History of Present Illness: Kristina Rowe is a 87 y.o. female presenting with a one day history of abdominal pain.  The patient reports that she started having severe epigastric abdominal pain around 4 pm on 4/12.  Her pain continued to worsen at home and she presented to ED for further evaluation.  She's had dry heaving and some nausea.  Denies any fevers, chills, chest pain, shortness of breath.  Reports having flatus and had a BM morning of 4/12.  In the ED, her laboratory workup showed a WBC of 6.6, mild anemia with Hgb of 9.2, normal renal function with Cr 0.8.  She is on chronic coumadin for history of PE and strong family history of hypercoagulable disease, and her INR is 3.8 today.  She had a CT scan of the abdomen/pelvis which showed mesenteric volvulus of the upper abdomen.  No perforation or bowel thickening.  Her lactic acid is 0.9.  Of note, she has history of UTI's and is currently on Augmentin prescribed by Dr. Diamantina Providence.  Past Medical History: Past Medical History:  Diagnosis Date  . Ductal carcinoma in situ (DCIS) of left breast   . Dysphagia   . GERD (gastroesophageal reflux disease)   . Hiatal hernia   . Hypertension   . Iron deficiency anemia   . Osteoporosis   . Pulmonary embolism ([place])   . Scoliosis   . UTI (urinary tract infection)      Past Surgical History: Past Surgical History:  Procedure Laterality Date  . BREAST LUMPECTOMY Left 2009   Ductal Carcinoma insitu   . CATARACT EXTRACTION, BILATERAL    . LAPAROSCOPIC PARAESOPHAGEAL HERNIA REPAIR  2009  . TUBAL LIGATION      Home Medications: Prior to Admission medications   Medication Sig Start Date End Date Taking? Authorizing Provider  acetaminophen (TYLENOL) 500 MG tablet Take 500 mg by mouth at bedtime as needed.   Yes [provider]  amitriptyline (ELAVIL) 10 MG tablet Take 10 mg by mouth at bedtime.   Yes [provider]  amLODipine (NORVASC) 2.5 MG tablet Take 2.5 mg by mouth daily. 02/24/21  Yes [provider]  amoxicillin-clavulanate (AUGMENTIN) 875-125 MG tablet Take 1 tablet by mouth 2 (two) times daily for 5 days. 03/09/21 03/14/21 Yes Billey Co, MD  calcium carbonate (OS-CAL) 1250 (500 Ca) MG chewable tablet Chew 1 tablet by mouth daily. 04/20/09  Yes [provider]  Cholecalciferol 50 MCG (2000 UT) CAPS Take 1 capsule by mouth daily.   Yes [provider]  cyanocobalamin 1000 MCG tablet Take 1,000 mcg by mouth daily.   Yes [provider]  diclofenac Sodium (VOLTAREN) 1 % GEL Apply topically 4 (four) times daily.   Yes [provider]  econazole nitrate 1 % cream Apply 1 application topically daily.   Yes [provider]  estradiol (ESTRACE) 0.1 MG/GM vaginal cream Estrogen Cream Instruction Discard applicator Apply pea sized amount to tip of finger to urethra before bed. Wash hands well after application. Use Monday, Wednesday and Friday 03/04/21  Yes Billey Co, MD  folic acid (FOLVITE) 1 MG tablet TAKE 1 TABLET(1 MG) BY MOUTH DAILY 01/17/19  Yes [provider]  loperamide (IMODIUM) 2 MG capsule Take by mouth as needed for diarrhea or loose stools.   Yes [provider]  mirtazapine (REMERON) 30 MG tablet Take 30 mg by mouth at bedtime. 03/02/21  Yes [provider]  pantoprazole (PROTONIX) 40 MG tablet Take 40 mg by mouth daily.   Yes [provider]  promethazine (PHENERGAN) 25 MG tablet Take 25 mg by mouth every 6 (six) hours as needed for nausea or vomiting.   Yes [provider]  SUMAtriptan (IMITREX) 50 MG tablet Take 1 at onset of headache. May repeat once in two hours. Do not exceed 2 per day or 4 per week. 03/15/19  Yes [provider]  tolterodine (DETROL LA) 4 MG 24 hr capsule Take 1 capsule (4 mg total) by mouth daily. Patient not taking: Reported on 03/10/2021 02/09/21    Billey Co, MD    Allergies: Allergies  Allergen Reactions  . Metronidazole Other (See Comments)    Other reaction(s): Other Mouth sores Mouth sores Mouth sores   . Omeprazole Other (See Comments)    Other reaction(s): Arthralgias (intolerance) Other reaction(s): Other   . Bactrim [Sulfamethoxazole-Trimethoprim] Nausea Only  . Codeine Nausea And Vomiting and Nausea Only    Other reaction(s): Nausea Only But tolerates tramadol     Social History:  reports that she has never smoked. She has never used smokeless tobacco. She reports that she does not drink alcohol and does not use drugs.   Family History: Family History  Problem Relation Age of Onset  . Breast cancer Neg Hx     Review of Systems: Review of Systems  Constitutional: Negative for chills and fever.  HENT: Negative for hearing loss.   Respiratory: Negative for shortness of breath.   Cardiovascular: Negative for chest pain.  Gastrointestinal: Positive for abdominal pain and nausea. Negative for constipation, diarrhea and vomiting.  Genitourinary: Negative for dysuria.  Musculoskeletal: Negative for myalgias.  Skin: Negative for rash.  Neurological: Negative for dizziness.  Psychiatric/Behavioral: Negative for depression.    Physical Exam BP 121/74   Pulse 90   Temp 97.8 F (36.6 C) (Oral)   Resp 16   Ht 5\' 1"  (1.549 m)   Wt 40.8 kg   SpO2 98%   BMI 17.01 kg/m  CONSTITUTIONAL: No acute distress HEENT:  Normocephalic, atraumatic, extraocular motion intact. NECK: Trachea is midline, and there is no jugular venous distension.  RESPIRATORY:  Normal respiratory effort without pathologic use of accessory muscles. CARDIOVASCULAR:  Regular rhythm and rate. GI: The abdomen is soft, mildly distended, with tenderness mostly in the epigastric area and mid abdomen.  No significant peritonitis.  MUSCULOSKELETAL:  Normal muscle strength and tone in all four extremities.  No peripheral edema or  cyanosis. SKIN: Skin turgor is normal. There are no pathologic skin lesions.  NEUROLOGIC:  Motor and sensation is grossly normal.  Cranial nerves are grossly intact. PSYCH:  Alert and oriented to person, place and time. Affect is normal.  Laboratory Analysis: Results for orders placed or performed during the hospital encounter of 03/09/21 (from the past 24 hour(s))  CBC with Differential/Platelet     Status: Abnormal   Collection Time: 03/09/21 11:06 PM  Result Value Ref Range   WBC 6.6 4.0 - 10.5 K/uL   RBC 3.39 (L) 3.87 - 5.11 MIL/uL   Hemoglobin 9.2 (L) 12.0 - 15.0 g/dL   HCT 29.8 (L) 36.0 - 46.0 %   MCV 87.9 80.0 - 100.0 fL   MCH 27.1 26.0 - 34.0 pg   MCHC 30.9 30.0 - 36.0 g/dL   RDW 17.2 (H) 11.5 - 15.5 %   Platelets 436 (H) 150 - 400 K/uL   nRBC 0.0 0.0 -  0.2 %   Neutrophils Relative % 80 %   Neutro Abs 5.3 1.7 - 7.7 K/uL   Lymphocytes Relative 11 %   Lymphs Abs 0.8 0.7 - 4.0 K/uL   Monocytes Relative 7 %   Monocytes Absolute 0.5 0.1 - 1.0 K/uL   Eosinophils Relative 0 %   Eosinophils Absolute 0.0 0.0 - 0.5 K/uL   Basophils Relative 1 %   Basophils Absolute 0.0 0.0 - 0.1 K/uL   Immature Granulocytes 1 %   Abs Immature Granulocytes 0.03 0.00 - 0.07 K/uL  Comprehensive metabolic panel     Status: Abnormal   Collection Time: 03/09/21 11:06 PM  Result Value Ref Range   Sodium 134 (L) 135 - 145 mmol/L   Potassium 4.4 3.5 - 5.1 mmol/L   Chloride 99 98 - 111 mmol/L   CO2 29 22 - 32 mmol/L   Glucose, Bld 111 (H) 70 - 99 mg/dL   BUN 22 8 - 23 mg/dL   Creatinine, Ser 0.89 0.44 - 1.00 mg/dL   Calcium 9.0 8.9 - 10.3 mg/dL   Total Protein 6.5 6.5 - 8.1 g/dL   Albumin 3.4 (L) 3.5 - 5.0 g/dL   AST 25 15 - 41 U/L   ALT 14 0 - 44 U/L   Alkaline Phosphatase 84 38 - 126 U/L   Total Bilirubin 0.5 0.3 - 1.2 mg/dL   GFR, Estimated >60 >60 mL/min   Anion gap 6 5 - 15  Lipase, blood     Status: None   Collection Time: 03/09/21 11:06 PM  Result Value Ref Range   Lipase 24 11 - 51  U/L  Protime-INR     Status: Abnormal   Collection Time: 03/09/21 11:06 PM  Result Value Ref Range   Prothrombin Time 37.3 (H) 11.4 - 15.2 seconds   INR 3.8 (H) 0.8 - 1.2  Troponin I (High Sensitivity)     Status: None   Collection Time: 03/09/21 11:06 PM  Result Value Ref Range   Troponin I (High Sensitivity) 7 <18 ng/L  Troponin I (High Sensitivity)     Status: None   Collection Time: 03/10/21  1:37 AM  Result Value Ref Range   Troponin I (High Sensitivity) 7 <18 ng/L  Lactic acid, plasma     Status: None   Collection Time: 03/10/21  1:37 AM  Result Value Ref Range   Lactic Acid, Venous 0.9 0.5 - 1.9 mmol/L  Resp Panel by RT-PCR (Flu A&B, Covid) Nasopharyngeal Swab     Status: None   Collection Time: 03/10/21  1:37 AM   Specimen: Nasopharyngeal Swab; Nasopharyngeal(NP) swabs in vial transport medium  Result Value Ref Range   SARS Coronavirus 2 by RT PCR NEGATIVE NEGATIVE   Influenza A by PCR NEGATIVE NEGATIVE   Influenza B by PCR NEGATIVE NEGATIVE  APTT     Status: Abnormal   Collection Time: 03/10/21  1:59 AM  Result Value Ref Range   aPTT 58 (H) 24 - 36 seconds  Urinalysis, Complete w Microscopic Urine, Random     Status: Abnormal   Collection Time: 03/10/21  2:10 AM  Result Value Ref Range   Color, Urine YELLOW (A) YELLOW   APPearance CLOUDY (A) CLEAR   Specific Gravity, Urine 1.029 1.005 - 1.030   pH 5.0 5.0 - 8.0   Glucose, UA NEGATIVE NEGATIVE mg/dL   Hgb urine dipstick SMALL (A) NEGATIVE   Bilirubin Urine NEGATIVE NEGATIVE   Ketones, ur NEGATIVE NEGATIVE mg/dL   Protein, ur  NEGATIVE NEGATIVE mg/dL   Nitrite POSITIVE (A) NEGATIVE   Leukocytes,Ua LARGE (A) NEGATIVE   RBC / HPF 0-5 0 - 5 RBC/hpf   WBC, UA >50 (H) 0 - 5 WBC/hpf   Bacteria, UA RARE (A) NONE SEEN   Squamous Epithelial / LPF 0-5 0 - 5   WBC Clumps PRESENT    Mucus PRESENT     Imaging: DG Chest 2 View  Result Date: 03/09/2021 CLINICAL DATA:  Worsening epigastric and chest pain. Nausea and  heaving. History of hiatal hernia. EXAM: CHEST - 2 VIEW COMPARISON:  None. FINDINGS: Eventration of right hemidiaphragm. Heart size difficult to assess, but likely upper normal. Aortic atherosclerosis and tortuosity. Mild atelectasis or scarring at the right lung base adjacent to elevated hemidiaphragm. No obvious hiatal hernia on the current exam. No acute airspace disease. No pleural effusion. Bones are diffusely under mineralized. Scoliotic curvature. Limited assessment of the mid lower thoracic spine. No obvious acute osseous abnormality. There is gaseous distention of colon under the right hemidiaphragm IMPRESSION: 1. Mild eventration of right hemidiaphragm with adjacent basilar atelectasis or scarring. No evidence of acute airspace disease. 2. Aortic atherosclerosis and tortuosity. 3. Gaseous distention of colon under the right hemidiaphragm, nonspecific. Electronically Signed   By: Keith Rake M.D.   On: 03/09/2021 23:51   CT ABDOMEN PELVIS W CONTRAST  Result Date: 03/10/2021 CLINICAL DATA:  Abdominal pain. EXAM: CT ABDOMEN AND PELVIS WITH CONTRAST TECHNIQUE: Multidetector CT imaging of the abdomen and pelvis was performed using the standard protocol following bolus administration of intravenous contrast. CONTRAST:  119mL OMNIPAQUE IOHEXOL 300 MG/ML  SOLN COMPARISON:  None. FINDINGS: Lower chest: Mild atelectasis is seen within the posterior aspect of the left lung base. Hepatobiliary: No focal liver abnormality is seen. No gallstones, gallbladder wall thickening, or biliary dilatation. Pancreas: Unremarkable. No pancreatic ductal dilatation or surrounding inflammatory changes. Spleen: Normal in size without focal abnormality. Adrenals/Urinary Tract: Adrenal glands are unremarkable. Kidneys are normal in size, without renal calculi or hydronephrosis. A 0.9 cm cystic appearing areas seen within the upper pole of the right kidney. The urinary bladder is moderately distended. Stomach/Bowel: There is a  large hiatal hernia with a moderate amount of adjacent fluid noted. The body of the stomach is elongated and extends inferiorly into the mid to lower right abdomen. This may be congenital in nature. Appendix appears normal. No evidence of bowel wall dilatation. Diverticula are seen throughout the large bowel. Twisting of the mesentery is seen within the midline of the upper abdomen on coronal images 21 through 35, CT series number 5). Vascular/Lymphatic: Mild aortic atherosclerosis with marked severity tortuosity of the abdominal aorta. Twisting of the mesentery and associated mesenteric vasculature is seen within the midline of the upper abdomen (best seen on coronal reformatted images 21-35, CT series number 5). Partial occlusion of the mesenteric veins is seen within this area. No enlarged abdominal or pelvic lymph nodes. Reproductive: Uterus and bilateral adnexa are unremarkable. Other: No abdominal wall hernia or abnormality. A mild amount of fluid is seen within the abdomen, with a mild-to-moderate amount of posterior pelvic free fluid also seen. Musculoskeletal: There is marked severity levoscoliosis of the lumbar spine. IMPRESSION: 1. Twisting of the mesentery and associated vasculature, as described above, consistent with a volvulus. 2.   Large hiatal hernia and surrounding fluid. 3.   Colonic diverticulosis. 4.   Marked severity levoscoliosis of the lumbar spine. 5.   Mild ascites. 6. Aortic atherosclerosis. Aortic Atherosclerosis (ICD10-I70.0). Electronically Signed  By: Virgina Norfolk M.D.   On: 03/10/2021 01:25    Assessment and Plan: This is a 87 y.o. female with abdominal pain and mesenteric volvulus on CT scan.  --Discussed CT scan findings with the patient and her daughter at bedside.  I have viewed the images personally and agree with the findings.  Discussed that given the findings, she does require urgent surgery.  There's no current evidence of perforation or bowel thickening, and her  lactic acid is normal at this point.  Discussed with her the role for exploratory laparotomy, and the possible need for bowel resection based on intraoperative findings.  Discussed hospital course, post-op lines/catheters, antibiotic coverage.  Reviewed with her the risks of bleeding, infection, and injury to surrounding structures.  She's willing to proceed. --She has received KCentra and Vitamin K in the ER and repeat INR is being ordered prior to proceeding with surgery.  Her COVID-19 test is negative.   [name], MD [facility] Pg:  [phone]

## 2021-03-10 NOTE — Plan of Care (Signed)

## 2021-03-10 NOTE — Brief Op Note (Signed)
03/09/2021 - 03/10/2021  6:46 AM  PATIENT:  Kristina Rowe  87 y.o. female  PRE-OPERATIVE DIAGNOSIS:  Mesenteric volvulus  POST-OPERATIVE DIAGNOSIS:  Mesenteric volvulus  PROCEDURE:  Procedure(s): EXPLORATORY LAPAROTOMY (N/A), Lysis of Adhesions  SURGEON:  Surgeon(s) and Role:    * Caitlyne Ingham, MD - Primary  ANESTHESIA:   general  EBL: 20 ml   BLOOD ADMINISTERED:none  DRAINS: (19 Fr) Jackson-Pratt drain(s) with closed bulb suction in the pelvis   LOCAL MEDICATIONS USED:  BUPIVICAINE   SPECIMEN:  No Specimen and Source of Specimen:  abdominal fluid culture swab  DISPOSITION OF SPECIMEN:  Micro  COUNTS:  YES  DICTATION: .Dragon Dictation  PLAN OF CARE: Admit to inpatient   PATIENT DISPOSITION:  PACU - hemodynamically stable.   Delay start of Pharmacological VTE agent (>24hrs) due to surgical blood loss or risk of bleeding: yes

## 2021-03-10 NOTE — ED Notes (Signed)
Dr Ellender Hose at bedside speaking to patient and family -- provider discussing CT results and recommendation for surgery per general surgeon.

## 2021-03-10 NOTE — Progress Notes (Signed)
Pharmacy Antibiotic Note  Kristina Rowe is a 87 y.o. female admitted on 03/09/2021 with intra-abdominal infection.  Pharmacy has been consulted for Zosyn dosing.  Plan: Zosyn 3.375g IV q8h (4 hour infusion).  Height: 5\' 1"  (154.9 cm) Weight: 40.8 kg (90 lb) IBW/kg (Calculated) : 47.8  Temp (24hrs), Avg:97.8 F (36.6 C), Min:97.8 F (36.6 C), Max:97.8 F (36.6 C)  Recent Labs  Lab 03/09/21 2306 03/10/21 0137  WBC 6.6  --   CREATININE 0.89  --   LATICACIDVEN  --  0.9    Estimated Creatinine Clearance: 28.1 mL/min (by C-G formula based on SCr of 0.89 mg/dL).    Allergies  Allergen Reactions  . Metronidazole Other (See Comments)    Other reaction(s): Other Mouth sores Mouth sores Mouth sores   . Omeprazole Other (See Comments)    Other reaction(s): Arthralgias (intolerance) Other reaction(s): Other   . Bactrim [Sulfamethoxazole-Trimethoprim] Nausea Only  . Codeine Nausea And Vomiting and Nausea Only    Other reaction(s): Nausea Only But tolerates tramadol     Antimicrobials this admission: Zosyn  4/13 >>     Dose adjustments this admission:  Microbiology results:   Thank you for allowing pharmacy to be a part of this patient's care.  Paulina Fusi, PharmD, BCPS 03/10/2021 3:29 AM

## 2021-03-10 NOTE — ED Notes (Signed)
Pt has now returned from [place]- no acute changes

## 2021-03-10 NOTE — ED Notes (Signed)
Pt reporting pain is getting worse-- now agreeable to try Morphine -- also requests anti-emetic - will notify Dr Ellender Hose

## 2021-03-10 NOTE — Progress Notes (Signed)
Brief Progress Note Patient seen day of surgery. She reports she is feeling better compared to presentation. Some expected incisional soreness and discomfort associated to foley catheter. No fever, chills ,nausea, emesis. She remains NPO. No reports of bowel function to this point.   Her incision is CDI with staples, minimal drainage on dressing. Incisional soreness but abdomen is otherwise soft, non-distended, Drain in the RLQ with thicker serous appearing fluid. Foley in place.   Plan: Continue NPO + IVF resuscitation Continue foley overnight; reassess need in morning Monitor abdominal examination; on-going bowel function Monitor and record drain output Pain control prn; antiemetics prn Morning labs Likely engage PT early  -- Edison Simon, Hershal Coria [facility] 03/10/2021, 3:23 PM [phone] M-F: 7am - 4pm

## 2021-03-10 NOTE — Progress Notes (Signed)
MEDICATION RELATED CONSULT NOTE - FOLLOW UP   Pharmacy Monitoring Post KCentra Indication: elevated INR on Warfarin and need for emergent surgery.  Allergies  Allergen Reactions  . Metronidazole Other (See Comments)    Other reaction(s): Other Mouth sores Mouth sores Mouth sores   . Omeprazole Other (See Comments)    Other reaction(s): Arthralgias (intolerance) Other reaction(s): Other   . Bactrim [Sulfamethoxazole-Trimethoprim] Nausea Only  . Codeine Nausea And Vomiting and Nausea Only    Other reaction(s): Nausea Only But tolerates tramadol     Patient Measurements: Height: 5\' 1"  (154.9 cm) Weight: 40.8 kg (90 lb) IBW/kg (Calculated) : 47.8 Adjusted Body Weight:    Vital Signs: Temp: 97.8 F (36.6 C) (04/12 2229) Temp Source: Oral (04/12 2229) BP: 121/74 (04/13 0246) Pulse Rate: 90 (04/13 0246) Intake/Output from previous day: No intake/output data recorded. Intake/Output from this shift: No intake/output data recorded.  Labs: Recent Labs    03/09/21 2306 03/10/21 0159  WBC 6.6  --   HGB 9.2*  --   HCT 29.8*  --   PLT 436*  --   APTT  --  58*  CREATININE 0.89  --   ALBUMIN 3.4*  --   PROT 6.5  --   AST 25  --   ALT 14  --   ALKPHOS 84  --   BILITOT 0.5  --    Estimated Creatinine Clearance: 28.1 mL/min (by C-G formula based on SCr of 0.89 mg/dL).    Medications:  Warfarin 2.5mg  Sun/Thur and 5mg  all other days  Assessment: INR on admission 3.8  Plan:  Received KCentra 1500 units IV once. Repeat INR 1 hour after infusion is 1.1. Will check INR daily x 2.  Paulina Fusi, PharmD, BCPS 03/10/2021 4:22 AM

## 2021-03-10 NOTE — Anesthesia Procedure Notes (Signed)
Procedure Name: Intubation Date/Time: 03/10/2021 5:00 AM Performed by: Lendon Colonel, CRNA Pre-anesthesia Checklist: Patient identified, Patient being monitored, Timeout performed, Emergency Drugs available and Suction available Patient Re-evaluated:Patient Re-evaluated prior to induction Oxygen Delivery Method: Circle system utilized Preoxygenation: Pre-oxygenation with 100% oxygen Induction Type: IV induction and Rapid sequence Laryngoscope Size: 3 and McGraph Grade View: Grade I Tube type: Oral Tube size: 7.0 mm Number of attempts: 1 Airway Equipment and Method: Stylet Placement Confirmation: ETT inserted through vocal cords under direct vision,  positive ETCO2 and breath sounds checked- equal and bilateral Secured at: 20 cm Tube secured with: Tape Dental Injury: Teeth and Oropharynx as per pre-operative assessment

## 2021-03-10 NOTE — ED Notes (Signed)
Pt off the floor to CT dept via stretcher

## 2021-03-10 NOTE — Op Note (Signed)
  Procedure Date:  03/10/2021  Pre-operative Diagnosis:  Mesenteric Volvulus  Post-operative Diagnosis:  Mesenteric Volvulus  Procedure:  Exploratory Laparotomy, lysis of adhesion.  Surgeon:  [name], MD  Anesthesia:  General endotracheal  Estimated Blood Loss:  20 ml  Specimens:  Culture swab of intra-abdominal fluid  Complications:  None  Findings:  Patient had small intestine wrapped around an adhesion from the stomach to the anterior abdominal wall.  No bowel ischemia.  Patient did have milky thin fluid in the abdomen and cultures were obtained.  No perforation.  Indications for Procedure:  This is a 87 y.o. female who presents with abdominal pain and peritonitis and workup revealing mesenteric volvulus.  The risks of bleeding, abscess or infection, injury to surrounding structures, and need for further procedures were all discussed with the patient and was willing to proceed.  Description of Procedure: The patient was correctly identified in the preoperative area and brought into the operating room.  The patient was placed supine with VTE prophylaxis in place.  Appropriate time-outs were performed.  Anesthesia was induced and the patient was intubated.  Foley catheter was placed.  Appropriate antibiotics were infused.  The abdomen was prepped and draped in a sterile fashion.  A midline incision was made and electrocautery was used to dissect down the subcutaneous tissue to the fascia.  The fascia was incised and extended superiorly and inferiorly.  It was immediately noted that the patient had a thick adhesive band from the stomach to the anterior abdominal wall, which was causing the small intestine to wrap around it, creating the volvulus.  After dissection and lysis of the adhesive band, it was found to contain a prolene suture, likely had been used to tack the stomach to the anterior abdominal wall on her prior hiatal hernia repair.  The stomach wall was reinforced at the  site of adhesiolysis as a precaution using 3-0 silk suture x 2.  The abdominal cavity contained thin milky white fluid, throughout, but there was no evidence of bowel perforation or chyle leak.  The fluid was swabbed for culture as a precaution.  The fluid was suctioned and the small bowel was run from ligament of Treitz to the terminal ileum.  There was one short segment that was mildly dilated from the volvulus, but without any ischemia or inflammation.  The colon was also evaluated and there was no injury. The abdominal cavity was then irrigated thoroughly using warm saline until all fluid return was clear.  The anesthesia team attempted NG tube placement multiple times during the surgery but the tube would coil in the esophagus, so it was removed.  50 ml of Exparel solution combined with 0.25% bupivacaine with epi was infiltrated onto the peritoneum, fascia, and subcutaneous tissue.  A 19 Fr. Blake drain was inserted in the LLQ going to the pelvis to help with drainage.  The fascia was closed using #1 PDS suture x 2.  The subcutaneous layer was approximated loosely using 3-0 Vicryl, and the skin was closed with staples.  The drain was secured using 3-0 Nylon suture.  The incision was cleaned and dressed with Honeycomb dressing and the drain with 4x4 gauze and TegaDerm.  The patient was emerged from anesthesia and extubated and brought to the recovery room for further management.  The patient tolerated the procedure well and all counts were correct at the end of the case.   [name], MD

## 2021-03-10 NOTE — Transfer of Care (Signed)
Immediate Anesthesia Transfer of Care Note  Patient: Kristina Rowe  Procedure(s) Performed: EXPLORATORY LAPAROTOMY (N/A Abdomen)  Patient Location: PACU  Anesthesia Type:General  Level of Consciousness: sedated and patient cooperative  Airway & Oxygen Therapy: Patient Spontanous Breathing and Patient connected to face mask oxygen  Post-op Assessment: Report given to RN and Post -op Vital signs reviewed and stable  Post vital signs: Reviewed and stable  Last Vitals:  Vitals Value Taken Time  BP 142/78 03/10/21 0650  Temp    Pulse 84 03/10/21 0652  Resp 13 03/10/21 0652  SpO2 100 % 03/10/21 0652  Vitals shown include unvalidated device data.  Last Pain:  Vitals:   03/10/21 0249  TempSrc:   PainSc: 6          Complications: No complications documented.

## 2021-03-10 NOTE — ED Notes (Signed)
Surgeon at bedside.  

## 2021-03-10 NOTE — OR Nursing (Signed)
Dr. Kayleen Memos aware of pinched upper lip and scratch.

## 2021-03-10 NOTE — ED Notes (Signed)
Pt offered 2mg  IVP morphine and 2nd dose of IVP zofran -- pt declines both at this time; states no morphine due to side effects -- Dr Ellender Hose notified

## 2021-03-11 ENCOUNTER — Inpatient Hospital Stay: Payer: Medicare Other | Admitting: Oncology

## 2021-03-11 ENCOUNTER — Inpatient Hospital Stay: Payer: Medicare Other

## 2021-03-11 ENCOUNTER — Encounter: Payer: Self-pay | Admitting: Surgery

## 2021-03-11 DIAGNOSIS — D649 Anemia, unspecified: Secondary | ICD-10-CM

## 2021-03-11 LAB — BASIC METABOLIC PANEL
Anion gap: 9 (ref 5–15)
BUN: 22 mg/dL (ref 8–23)
CO2: 24 mmol/L (ref 22–32)
Calcium: 8.5 mg/dL — ABNORMAL LOW (ref 8.9–10.3)
Chloride: 103 mmol/L (ref 98–111)
Creatinine, Ser: 0.97 mg/dL (ref 0.44–1.00)
GFR, Estimated: 56 mL/min — ABNORMAL LOW (ref >60)
Glucose, Bld: 83 mg/dL (ref 70–99)
Potassium: 4.2 mmol/L (ref 3.5–5.1)
Sodium: 136 mmol/L (ref 135–145)

## 2021-03-11 LAB — PROTIME-INR
INR: 1.3 — ABNORMAL HIGH (ref 0.8–1.2)
Prothrombin Time: 16.1 seconds — ABNORMAL HIGH (ref 11.4–15.2)

## 2021-03-11 LAB — CBC
HCT: 25.5 % — ABNORMAL LOW (ref 36.0–46.0)
Hemoglobin: 8 g/dL — ABNORMAL LOW (ref 12.0–15.0)
MCH: 27.7 pg (ref 26.0–34.0)
MCHC: 31.4 g/dL (ref 30.0–36.0)
MCV: 88.2 fL (ref 80.0–100.0)
Platelets: 379 10*3/uL (ref 150–400)
RBC: 2.89 MIL/uL — ABNORMAL LOW (ref 3.87–5.11)
RDW: 17.6 % — ABNORMAL HIGH (ref 11.5–15.5)
WBC: 9.5 10*3/uL (ref 4.0–10.5)
nRBC: 0 % (ref 0.0–0.2)

## 2021-03-11 LAB — MAGNESIUM: Magnesium: 1.7 mg/dL (ref 1.7–2.4)

## 2021-03-11 MED ORDER — KETOROLAC TROMETHAMINE 15 MG/ML IJ SOLN
7.5000 mg | Freq: Four times a day (QID) | INTRAMUSCULAR | Status: DC
  Filled 2021-03-11 (×3): qty 1

## 2021-03-11 MED ORDER — MAGNESIUM SULFATE IN D5W 1-5 GM/100ML-% IV SOLN
1.0000 g | Freq: Once | INTRAVENOUS | Status: AC
  Administered 2021-03-11: 1 g via INTRAVENOUS
  Filled 2021-03-11: qty 100

## 2021-03-11 MED ORDER — BOOST / RESOURCE BREEZE PO LIQD CUSTOM
1.0000 | Freq: Three times a day (TID) | ORAL | Status: DC
  Administered 2021-03-11 – 2021-03-14 (×6): 1 via ORAL

## 2021-03-11 MED ORDER — ENOXAPARIN SODIUM 30 MG/0.3ML ~~LOC~~ SOLN
30.0000 mg | SUBCUTANEOUS | Status: DC
  Administered 2021-03-11 – 2021-03-12 (×2): 30 mg via SUBCUTANEOUS
  Filled 2021-03-11 (×2): qty 0.3

## 2021-03-11 NOTE — Progress Notes (Signed)
MEDICATION RELATED CONSULT NOTE - FOLLOW UP   Pharmacy Monitoring Post [facility] Indication: elevated INR on Warfarin and need for emergent surgery.  Allergies  Allergen Reactions  . Metronidazole Other (See Comments)    Other reaction(s): Other Mouth sores Mouth sores Mouth sores   . Omeprazole Other (See Comments)    Other reaction(s): Arthralgias (intolerance) Other reaction(s): Other   . Bactrim [Sulfamethoxazole-Trimethoprim] Nausea Only  . Codeine Nausea And Vomiting and Nausea Only    Other reaction(s): Nausea Only But tolerates tramadol     Patient Measurements: Height: 5\' 1"  (154.9 cm) Weight: 40.8 kg (90 lb) IBW/kg (Calculated) : 47.8 Adjusted Body Weight:    Vital Signs: Temp: 97.6 F (36.4 C) (04/14 0743) Temp Source: Oral (04/14 0743) BP: 126/74 (04/14 0743) Pulse Rate: 93 (04/14 0743) Intake/Output from previous day: 04/13 0701 - 04/14 0700 In: 300 [I.V.:200; IV Piggyback:100] Out: 1710 [Urine:1400; Drains:260] Intake/Output from this shift: No intake/output data recorded.  Labs: Recent Labs    03/09/21 2306 03/10/21 0159 03/11/21 0636  WBC 6.6  --  9.5  HGB 9.2*  --  8.0*  HCT 29.8*  --  25.5*  PLT 436*  --  379  APTT  --  58*  --   CREATININE 0.89  --  0.97  MG  --   --  1.7  ALBUMIN 3.4*  --   --   PROT 6.5  --   --   AST 25  --   --   ALT 14  --   --   ALKPHOS 84  --   --   BILITOT 0.5  --   --    Estimated Creatinine Clearance: 25.8 mL/min (by C-G formula based on SCr of 0.97 mg/dL).    Medications:  Warfarin 2.5mg  Sun/Thur and 5mg  all other days For PE hx and strong family hx of hypercoagulable disease  Assessment: INR on admission 3.8, HGB 9.2 on admission   0413 0236 Vit K 10mg  x 1 0413 0235 Received KCentra 1500 units IV once 0413 0347 INR 1.2 0414 0520 INR 1.3 0414 0636 HGB 8.0  Pt currently on DVT prophylaxis with enoxaparin 30mg  qd  Plan:  Will check INR/CBC daily x at least 1 more day Will follow-up with  primary team for appropriate restart of warfarin    Lu Duffel, PharmD, BCPS Clinical Pharmacist 03/11/2021 8:32 AM

## 2021-03-11 NOTE — Progress Notes (Signed)
[place] Hospital Day(s): 1.   Post op day(s): 1 Day Post-Op.   Interval History:  Patient seen and examined No acute events or new complaints overnight.  Patient reports she continues to feel much better compared to presentation Still with abdominal soreness No fever, chills, nausea, emesis No leukocytosis this morning; WBC 9.4K Hgb low 8.0; likely dilutional Maintaining renal function, sCr - 0.97; UO - 1.4L No electrolyte derangements Surgical drain with 260 ccs out; serous She remained NPO overnight  Vital signs in last 24 hours: [min-max] current  Temp:  [98 F (36.7 C)-99.7 F (37.6 C)] 98.3 F (36.8 C) (04/14 0516) Pulse Rate:  [77-88] 86 (04/14 0516) Resp:  [12-16] 13 (04/14 0516) BP: (94-114)/(55-68) 114/66 (04/14 0516) SpO2:  [92 %-100 %] 95 % (04/14 0516)     Height: 5\' 1"  (154.9 cm) Weight: 40.8 kg BMI (Calculated): 17.01   Intake/Output last 2 shifts:  04/13 0701 - 04/14 0700 In: 300 [I.V.:200; IV Piggyback:100] Out: 1710 [Urine:1400; Drains:260]   Physical Exam:  Constitutional: alert, cooperative and no distress  Respiratory: breathing non-labored at rest  Cardiovascular: regular rate and sinus rhythm  Gastrointestinal: Soft, incisional soreness, non-distended, no rebound/guarding. Surgical drain in RLQ with serous appearing fluid Integumentary: Laparotomy incision is CDI with staples, no erythema.   Labs:  CBC Latest Ref Rng & Units 03/11/2021 03/09/2021  WBC 4.0 - 10.5 K/uL 9.5 6.6  Hemoglobin 12.0 - 15.0 g/dL 8.0(L) 9.2(L)  Hematocrit 36.0 - 46.0 % 25.5(L) 29.8(L)  Platelets 150 - 400 K/uL 379 436(H)   CMP Latest Ref Rng & Units 03/11/2021 03/09/2021  Glucose 70 - 99 mg/dL 83 111(H)  BUN 8 - 23 mg/dL 22 22  Creatinine 0.44 - 1.00 mg/dL 0.97 0.89  Sodium 135 - 145 mmol/L 136 134(L)  Potassium 3.5 - 5.1 mmol/L 4.2 4.4  Chloride 98 - 111 mmol/L 103 99  CO2 22 - 32 mmol/L 24 29  Calcium 8.9 - 10.3 mg/dL  8.5(L) 9.0  Total Protein 6.5 - 8.1 g/dL - 6.5  Total Bilirubin 0.3 - 1.2 mg/dL - 0.5  Alkaline Phos 38 - 126 U/L - 84  AST 15 - 41 U/L - 25  ALT 0 - 44 U/L - 14     Imaging studies: No new pertinent imaging studies  Assessment/Plan:  87 y.o. female overall doing well 1 Day Post-Op s/p exploratory laparotomy and lysis of adhesions for mesenteric volvulus.    - Initiate CLD + nutritional supplementation (Boost)  - Wean IVF resuscitation as diet advances  - Discontinue foley catheter  - Monitor abdominal examination; on-going bowel function   - Pain control prn; antiemetics prn  - Engage PT today for early mobilization   All of the above findings and recommendations were discussed with the patient, and the medical team, and all of patient's questions were answered to her expressed satisfaction.  -- Edison Simon, PA-C [facility] 03/11/2021, 7:33 AM [phone] M-F: 7am - 4pm

## 2021-03-11 NOTE — Anesthesia Postprocedure Evaluation (Signed)
Anesthesia Post Note  Patient: Kristina Rowe  Procedure(s) Performed: EXPLORATORY LAPAROTOMY (N/A Abdomen)  Patient location during evaluation: PACU Anesthesia Type: General Level of consciousness: awake and alert and oriented Pain management: pain level controlled Vital Signs Assessment: post-procedure vital signs reviewed and stable Respiratory status: spontaneous breathing Cardiovascular status: blood pressure returned to baseline Anesthetic complications: no   No complications documented.   Last Vitals:  Vitals:   03/11/21 0743 03/11/21 1108  BP: 126/74 (!) 95/51  Pulse: 93 92  Resp: 16 18  Temp: 36.4 C 36.8 C  SpO2: 94% 98%    Last Pain:  Vitals:   03/11/21 1108  TempSrc: Oral  PainSc:                  Ashantee Deupree

## 2021-03-11 NOTE — Progress Notes (Signed)
PHARMACIST - PHYSICIAN COMMUNICATION  CONCERNING:  Enoxaparin (Lovenox) for DVT Prophylaxis    RECOMMENDATION: Patient was prescribed enoxaparin 40mg  q24 hours for VTE prophylaxis.   Filed Weights   03/09/21 2228  Weight: 40.8 kg (90 lb)    Body mass index is 17.01 kg/m.  Estimated Creatinine Clearance: 25.8 mL/min (by C-G formula based on SCr of 0.97 mg/dL).  Patient is candidate for enoxaparin 30mg  every 24 hours based on CrCl <32ml/min or Weight <45kg  DESCRIPTION: Pharmacy has adjusted enoxaparin dose per [facility] policy.  Patient is now receiving enoxaparin 30 mg every 24 hours   Benita Gutter 03/11/2021 7:39 AM

## 2021-03-11 NOTE — Evaluation (Signed)
Physical Therapy Evaluation Patient Details Name: Kristina Rowe MRN: [id number] DOB: 08/06/1930 Today's Date: 03/11/2021   History of Present Illness  Kristina Rowe is a 87 y.o. female presenting with a one day history of abdominal pain.  The patient reports that she started having severe epigastric abdominal pain around 4 pm on 4/12.  Her pain continued to worsen at home and she presented to ED for further evaluation.  She's had dry heaving and some nausea.  Denies any fevers, chills, chest pain, shortness of breath. History includes carcinoma of L breast, dysphagia, GERD< HTN, PE, scoliosis, and UTI. Pt is s/p exploartory laprotomy on 03/10/21.  Clinical Impression  Pt is a pleasant 87 year old female who was admitted for for voluvlus of intestine. Pt is s/p exploratory laparotomy on 4/13. Pt demonstrates all bed mobility/transfers/ambulation at baseline level. Pt does not require any further PT needs at this time. Encouraged to continue mobility efforts with RN staff and use IS. Pt in agreement. Pt will be dc in house and does not require follow up. RN aware. Will dc current orders.     Follow Up Recommendations No PT follow up    Equipment Recommendations  None recommended by PT    Recommendations for Other Services       Precautions / Restrictions Precautions Precautions: Fall Restrictions Weight Bearing Restrictions: No      Mobility  Bed Mobility Overal bed mobility: Independent             General bed mobility comments: safe technique with ease of mobility    Transfers Overall transfer level: Independent Equipment used: None             General transfer comment: safe technique with upright posture.  Ambulation/Gait Ambulation/Gait assistance: Supervision Gait Distance (Feet): 200 Feet Assistive device: IV Pole Gait Pattern/deviations: Step-through pattern     General Gait Details: ambulated around RN station pushing IV pole. Safe technique with  reciprocal gait pattern performed. No SOB symptoms. Complained of slight discomfort.  Stairs            Wheelchair Mobility    Modified Rankin (Stroke Patients Only)       Balance Overall balance assessment: Modified Independent (baseline uses SPC)                                           Pertinent Vitals/Pain Pain Assessment: Faces Faces Pain Scale: Hurts a little bit Pain Location: abdomen Pain Descriptors / Indicators: Operative site guarding Pain Intervention(s): Limited activity within patient's tolerance    Home Living Family/patient expects to be discharged to:: Private residence Living Arrangements: Alone Available Help at Discharge: Family;Available PRN/intermittently Type of Home: House Home Access: Stairs to enter Entrance Stairs-Rails: None Entrance Stairs-Number of Steps: 1 Home Layout: One level Home Equipment: Cane - single point      Prior Function Level of Independence: Independent with assistive device(s)         Comments: previously used SPC occasionally, however now uses SPC regulary in community and occasionally in home     Hand Dominance        Extremity/Trunk Assessment   Upper Extremity Assessment Upper Extremity Assessment: Overall WFL for tasks assessed    Lower Extremity Assessment Lower Extremity Assessment: Overall WFL for tasks assessed       Communication   Communication: No difficulties  Cognition Arousal/Alertness:  Awake/alert Behavior During Therapy: WFL for tasks assessed/performed Overall Cognitive Status: Within Functional Limits for tasks assessed                                        General Comments      Exercises     Assessment/Plan    PT Assessment Patent does not need any further PT services  PT Problem List         PT Treatment Interventions      PT Goals (Current goals can be found in the Care Plan section)  Acute Rehab PT Goals Patient Stated Goal:  to go home PT Goal Formulation: With patient Time For Goal Achievement: 03/11/21 Potential to Achieve Goals: Good    Frequency     Barriers to discharge        Co-evaluation               AM-PAC PT "6 Clicks" Mobility  Outcome Measure Help needed turning from your back to your side while in a flat bed without using bedrails?: None Help needed moving from lying on your back to sitting on the side of a flat bed without using bedrails?: None Help needed moving to and from a bed to a chair (including a wheelchair)?: None Help needed standing up from a chair using your arms (e.g., wheelchair or bedside chair)?: None Help needed to walk in hospital room?: A Little Help needed climbing 3-5 steps with a railing? : A Little 6 Click Score: 22    End of Session Equipment Utilized During Treatment: Gait belt Activity Tolerance: Patient tolerated treatment well Patient left: in chair;with chair alarm set Nurse Communication: Mobility status PT Visit Diagnosis: Difficulty in walking, not elsewhere classified (R26.2)    Time: 7619-5093 PT Time Calculation (min) (ACUTE ONLY): 19 min   Charges:   PT Evaluation $PT Eval Low Complexity: 1 Low PT Treatments $Gait Training: 8-22 mins        Greggory Stallion, PT, DPT [phone]   [facility] 03/11/2021, 12:07 PM

## 2021-03-11 NOTE — Progress Notes (Signed)
Patient complaining about diet stated she was getting too much fluid and she was hoping for something with a little more substance tomorrow.  MD    Notified but no new orders

## 2021-03-12 LAB — CULTURE, URINE COMPREHENSIVE

## 2021-03-12 LAB — PROTIME-INR
INR: 1.3 — ABNORMAL HIGH (ref 0.8–1.2)
Prothrombin Time: 16.3 seconds — ABNORMAL HIGH (ref 11.4–15.2)

## 2021-03-12 MED ORDER — WARFARIN - PHARMACIST DOSING INPATIENT: Freq: Every day | Status: DC

## 2021-03-12 MED ORDER — DOCUSATE SODIUM 50 MG PO CAPS
50.0000 mg | ORAL_CAPSULE | Freq: Every day | ORAL | Status: DC
  Filled 2021-03-12: qty 1

## 2021-03-12 MED ORDER — ENOXAPARIN SODIUM 40 MG/0.4ML ~~LOC~~ SOLN
40.0000 mg | SUBCUTANEOUS | Status: DC
  Administered 2021-03-13: 40 mg via SUBCUTANEOUS
  Filled 2021-03-12: qty 0.4

## 2021-03-12 MED ORDER — POLYETHYLENE GLYCOL 3350 17 G PO PACK: 17.0000 g | PACK | Freq: Every day | ORAL | Status: DC

## 2021-03-12 MED ORDER — WARFARIN SODIUM 7.5 MG PO TABS
7.5000 mg | ORAL_TABLET | ORAL | Status: AC
  Administered 2021-03-12: 7.5 mg via ORAL
  Filled 2021-03-12: qty 1

## 2021-03-12 MED ORDER — DOCUSATE SODIUM 50 MG/5ML PO LIQD
50.0000 mg | Freq: Every day | ORAL | Status: DC
  Filled 2021-03-12 (×2): qty 10

## 2021-03-12 MED ORDER — ENSURE ENLIVE PO LIQD: 237.0000 mL | Freq: Two times a day (BID) | ORAL | Status: DC

## 2021-03-12 NOTE — Progress Notes (Signed)
Mobility Specialist - Progress Note   03/12/21 1700  Mobility  Activity Ambulated in hall  Level of Assistance Modified independent, requires aide device or extra time  Assistive Device Other (Comment) (IV Pole)  Distance Ambulated (ft) 350 ft  Mobility Response Tolerated well  Mobility performed by Mobility specialist  $Mobility charge 1 Mobility    Pt ambulated in hallway pushing IV pole. No LOB noted. Denied SOB. 87 HR, 98% Spo2 on RA. Very motivated throughout session.    Kathee Delton Mobility Specialist 03/12/21, 5:22 PM

## 2021-03-12 NOTE — Care Management Important Message (Signed)
Important Message  Patient Details  Name: Kristina Rowe MRN: [id number] Date of Birth: [birth year]   Medicare Important Message Given:  Yes     Dannette Barbara 03/12/2021, 12:18 PM

## 2021-03-12 NOTE — Plan of Care (Signed)
  Problem: Clinical Measurements: Goal: Ability to maintain clinical measurements within normal limits will improve Outcome: Progressing Goal: Will remain free from infection Outcome: Progressing Goal: Diagnostic test results will improve Outcome: Progressing Goal: Respiratory complications will improve Outcome: Progressing Goal: Cardiovascular complication will be avoided Outcome: Progressing   Problem: Pain Managment: Goal: General experience of comfort will improve Outcome: Progressing  Patient is involved in and agrees with the plan of care. V/S stable. Denies any pain. No BM yet but passing gas. Pt ambulates at the hallway with standby assist. Voiding well. Jp drain in place with serous output.

## 2021-03-12 NOTE — Progress Notes (Signed)
[place] Hospital Day(s): 2.   Post op day(s): 2 Days Post-Op.   Interval History:  Patient seen and examined No acute events or new complaints overnight.  Patient reports she has incisional soreness but markedly improved, only needing tylenol for pain No fever, chills, nausea, emesis No new labs this morning aside from INR Surgical drain output is 40 ccs; serous She has tolerated CLD without issues She is passing flatus Worked with PT; no follow up needed  Vital signs in last 24 hours: [min-max] current  Temp:  [97.6 F (36.4 C)-98.4 F (36.9 C)] 98.3 F (36.8 C) (04/15 0254) Pulse Rate:  [76-93] 90 (04/15 0254) Resp:  [16-18] 16 (04/15 0254) BP: (95-126)/(51-78) 125/78 (04/15 0254) SpO2:  [94 %-99 %] 97 % (04/15 0254)     Height: 5\' 1"  (154.9 cm) Weight: 40.8 kg BMI (Calculated): 17.01   Intake/Output last 2 shifts:  04/14 0701 - 04/15 0700 In: 2820.9 [P.O.:2620; I.V.:0.8; IV Piggyback:200] Out: 160 [Drains:40]   Physical Exam:  Constitutional: alert, cooperative and no distress  Respiratory: breathing non-labored at rest  Cardiovascular: regular rate and sinus rhythm  Gastrointestinal: Soft, incisional soreness improvement, non-distended, no rebound/guarding. Surgical drain in LLQ with serous appearing fluid Integumentary: Laparotomy incision is CDI with staples, no erythema.   Labs:  CBC Latest Ref Rng & Units 03/11/2021 03/09/2021  WBC 4.0 - 10.5 K/uL 9.5 6.6  Hemoglobin 12.0 - 15.0 g/dL 8.0(L) 9.2(L)  Hematocrit 36.0 - 46.0 % 25.5(L) 29.8(L)  Platelets 150 - 400 K/uL 379 436(H)   CMP Latest Ref Rng & Units 03/11/2021 03/09/2021  Glucose 70 - 99 mg/dL 83 111(H)  BUN 8 - 23 mg/dL 22 22  Creatinine 0.44 - 1.00 mg/dL 0.97 0.89  Sodium 135 - 145 mmol/L 136 134(L)  Potassium 3.5 - 5.1 mmol/L 4.2 4.4  Chloride 98 - 111 mmol/L 103 99  CO2 22 - 32 mmol/L 24 29  Calcium 8.9 - 10.3 mg/dL 8.5(L) 9.0  Total Protein 6.5 - 8.1  g/dL - 6.5  Total Bilirubin 0.3 - 1.2 mg/dL - 0.5  Alkaline Phos 38 - 126 U/L - 84  AST 15 - 41 U/L - 25  ALT 0 - 44 U/L - 14     Imaging studies: No new pertinent imaging studies   Assessment/Plan:  87 y.o. female with return of bowel function 2 Days Post-Op s/p exploratory laparotomy and lysis of adhesions for mesenteric volvulus.    - Will advance to full liquid diet this AM + nutritional supplementation (Boost -vs- Ensure); Okay to advance to soft diet later today if doing well             - Discontinue IVF  - Continue IV Abx (Zosyn); okay to DC today             - Monitor abdominal examination; on-going bowel function   - Maintain surgical drain; monitor and record output; may be able to remove at discharge              - Pain control prn; antiemetics prn             - Encouraged mobilization; PT evaluated and had no recommendations  - Continue Lovenox; transition to Warfarin when amenable     - Anticipate ready for discharge in the next 24-48 hours; follow up next Friday (04/22) for staple removal with Dr Hampton Abbot  All of the above findings and recommendations were discussed with the patient, and  the medical team, and all of patient's questions were answered to her expressed satisfaction.  -- Edison Simon, PA-C [facility] 03/12/2021, 7:26 AM [phone] M-F: 7am - 4pm

## 2021-03-12 NOTE — Consult Note (Addendum)
ANTICOAGULATION CONSULT NOTE - Initial Consult  Pharmacy Consult for warfarin with lovenox bridge Indication:  PE hx and strong family hx of hypercoagulable disease  Allergies  Allergen Reactions  . Metronidazole Other (See Comments)    Other reaction(s): Other Mouth sores Mouth sores Mouth sores   . Omeprazole Other (See Comments)    Other reaction(s): Arthralgias (intolerance) Other reaction(s): Other   . Bactrim [Sulfamethoxazole-Trimethoprim] Nausea Only  . Codeine Nausea And Vomiting and Nausea Only    Other reaction(s): Nausea Only But tolerates tramadol     Patient Measurements: Height: 5\' 1"  (154.9 cm) Weight: 40.8 kg (90 lb) IBW/kg (Calculated) : 47.8  Vital Signs: Temp: 98.3 F (36.8 C) (04/15 0903) Temp Source: Oral (04/15 0903) BP: 114/72 (04/15 0903) Pulse Rate: 91 (04/15 0903)  Labs: Recent Labs    03/09/21 2306 03/10/21 0137 03/10/21 0159 03/10/21 0347 03/11/21 0520 03/11/21 0636 03/12/21 0519  HGB 9.2*  --   --   --   --  8.0*  --   HCT 29.8*  --   --   --   --  25.5*  --   PLT 436*  --   --   --   --  379  --   APTT  --   --  58*  --   --   --   --   LABPROT 37.3*  --   --  15.6* 16.1*  --  16.3*  INR 3.8*  --   --  1.2 1.3*  --  1.3*  CREATININE 0.89  --   --   --   --  0.97  --   TROPONINIHS 7 7  --   --   --   --   --     Estimated Creatinine Clearance: 25.8 mL/min (by C-G formula based on SCr of 0.97 mg/dL).   Medical History: Past Medical History:  Diagnosis Date  . Ductal carcinoma in situ (DCIS) of left breast   . Dysphagia   . GERD (gastroesophageal reflux disease)   . Hiatal hernia   . Hypertension   . Iron deficiency anemia   . Osteoporosis   . Pulmonary embolism ([place])   . Scoliosis   . UTI (urinary tract infection)     Medications:  Warfarin 2.5mg  Sun/Thur and 5mg  all other days For PE hx and strong family hx of hypercoagulable disease  Assessment: INR on admission 3.8, HGB 9.2 on admission  Pt reversed for  emergent surgery 4/13, now resuming warfarin therapy with lovenox bridge (per discussion with surgery)  0413 0236 Vit K 10mg  x 1 0413 0235 Received KCentra 1500 units IV once 0413 0347 INR 1.2 0414 0520 INR 1.3 0414 0636 HGB 8.0  Goal of Therapy:  INR 2-3 Monitor platelets by anticoagulation protocol: Yes   Plan:  - Will increase enoxaparin from 30mg  q24 to 40mg  q24 (renally adjusted therapeutic dose)   - Continue enoxaparin until INR therpaeutic x 2  - Will start pt now at 50% greater than home dose of warfarin per protocol for subtherapeutic INR (7.5mg  today)  - INR daily  - CBC's/SCr a minimum of every 3 days  - Resume home dosing as above when patient ready for discharge  Lu Duffel, PharmD, BCPS Clinical Pharmacist 03/12/2021 10:05 AM

## 2021-03-12 NOTE — Discharge Instructions (Signed)
In addition to included general post-operative instructions,  Diet: Resume home diet.   Activity: No heavy lifting >20 pounds (children, pets, laundry, garbage) for 4 weeks, but light activity and walking are encouraged. Do not drive or drink alcohol if taking narcotic pain medications or having pain that might distract from driving.  Wound care: You may shower/get incision wet with soapy water and pat dry (do not rub incisions), but no baths or submerging incision underwater until follow-up.   Medications: Resume all home medications. For mild to moderate pain: acetaminophen (Tylenol) or ibuprofen/naproxen (if no kidney disease). Combining Tylenol with alcohol can substantially increase your risk of causing liver disease. Narcotic pain medications, if prescribed, can be used for severe pain, though may cause nausea, constipation, and drowsiness. Do not combine Tylenol and Percocet (or similar) within a 6 hour period as Percocet (and similar) contain(s) Tylenol. If you do not need the narcotic pain medication, you do not need to fill the prescription.  Call office [phone] / [phone]) at any time if any questions, worsening pain, fevers/chills, bleeding, drainage from incision site, or other concerns.

## 2021-03-12 NOTE — Progress Notes (Addendum)
Pharmacy Antibiotic Note  Kristina Rowe is a 87 y.o. female admitted on 03/09/2021 with intra-abdominal infection.    Pharmacy has been consulted for Zosyn dosing.  Plan:   Continue Zosyn 3.375g IV q8h (4 hour infusion).   Addendum: per surgery ok to stop after today  Height: 5\' 1"  (154.9 cm) Weight: 40.8 kg (90 lb) IBW/kg (Calculated) : 47.8  Temp (24hrs), Avg:98.3 F (36.8 C), Min:98.2 F (36.8 C), Max:98.4 F (36.9 C)  Recent Labs  Lab 03/09/21 2306 03/10/21 0137 03/10/21 0347 03/11/21 0636  WBC 6.6  --   --  9.5  CREATININE 0.89  --   --  0.97  LATICACIDVEN  --  0.9 1.1  --     Estimated Creatinine Clearance: 25.8 mL/min (by C-G formula based on SCr of 0.97 mg/dL).    Allergies  Allergen Reactions  . Metronidazole Other (See Comments)    Other reaction(s): Other Mouth sores Mouth sores Mouth sores   . Omeprazole Other (See Comments)    Other reaction(s): Arthralgias (intolerance) Other reaction(s): Other   . Bactrim [Sulfamethoxazole-Trimethoprim] Nausea Only  . Codeine Nausea And Vomiting and Nausea Only    Other reaction(s): Nausea Only But tolerates tramadol     Antimicrobials this admission: Zosyn  4/13 >>     Dose adjustments this admission:  Microbiology results: IA fluid 4/13 >> NGTD, no organism seen  Thank you for allowing pharmacy to be a part of this patient's care.  Lu Duffel, PharmD, BCPS Clinical Pharmacist 03/12/2021 8:22 AM

## 2021-03-13 LAB — PROTIME-INR
INR: 1.5 — ABNORMAL HIGH (ref 0.8–1.2)
Prothrombin Time: 17.8 seconds — ABNORMAL HIGH (ref 11.4–15.2)

## 2021-03-13 LAB — CBC
HCT: 28.4 % — ABNORMAL LOW (ref 36.0–46.0)
Hemoglobin: 8.9 g/dL — ABNORMAL LOW (ref 12.0–15.0)
MCH: 27.8 pg (ref 26.0–34.0)
MCHC: 31.3 g/dL (ref 30.0–36.0)
MCV: 88.8 fL (ref 80.0–100.0)
Platelets: 462 10*3/uL — ABNORMAL HIGH (ref 150–400)
RBC: 3.2 MIL/uL — ABNORMAL LOW (ref 3.87–5.11)
RDW: 17.6 % — ABNORMAL HIGH (ref 11.5–15.5)
WBC: 7.4 10*3/uL (ref 4.0–10.5)
nRBC: 0 % (ref 0.0–0.2)

## 2021-03-13 MED ORDER — WARFARIN SODIUM 7.5 MG PO TABS
7.5000 mg | ORAL_TABLET | Freq: Once | ORAL | Status: AC
  Administered 2021-03-13: 7.5 mg via ORAL
  Filled 2021-03-13: qty 1

## 2021-03-13 MED ORDER — LOPERAMIDE HCL 2 MG PO CAPS
2.0000 mg | ORAL_CAPSULE | ORAL | Status: DC | PRN
  Administered 2021-03-13 – 2021-03-14 (×2): 2 mg via ORAL
  Filled 2021-03-13 (×2): qty 1

## 2021-03-13 MED ORDER — SODIUM CHLORIDE 0.9% FLUSH
10.0000 mL | Freq: Two times a day (BID) | INTRAVENOUS | Status: DC
  Administered 2021-03-13: 10 mL via INTRAVENOUS

## 2021-03-13 NOTE — Consult Note (Signed)
[place] for warfarin with lovenox bridge Indication:  PE hx and strong family hx of hypercoagulable disease  Allergies  Allergen Reactions  . Metronidazole Other (See Comments)    Other reaction(s): Other Mouth sores Mouth sores Mouth sores   . Omeprazole Other (See Comments)    Other reaction(s): Arthralgias (intolerance) Other reaction(s): Other   . Bactrim [Sulfamethoxazole-Trimethoprim] Nausea Only  . Codeine Nausea And Vomiting and Nausea Only    Other reaction(s): Nausea Only But tolerates tramadol     Patient Measurements: Height: 5\' 1"  (154.9 cm) Weight: 40.8 kg (90 lb) IBW/kg (Calculated) : 47.8  Vital Signs: Temp: 98.2 F (36.8 C) (04/16 0818) Temp Source: Oral (04/16 0818) BP: 109/66 (04/16 0818) Pulse Rate: 89 (04/16 0818)  Labs: Recent Labs    03/11/21 0520 03/11/21 0636 03/12/21 0519 03/13/21 1036  HGB  --  8.0*  --  8.9*  HCT  --  25.5*  --  28.4*  PLT  --  379  --  462*  LABPROT 16.1*  --  16.3* 17.8*  INR 1.3*  --  1.3* 1.5*  CREATININE  --  0.97  --   --     Estimated Creatinine Clearance: 25.8 mL/min (by C-G formula based on SCr of 0.97 mg/dL).   Medical History: Past Medical History:  Diagnosis Date  . Ductal carcinoma in situ (DCIS) of left breast   . Dysphagia   . GERD (gastroesophageal reflux disease)   . Hiatal hernia   . Hypertension   . Iron deficiency anemia   . Osteoporosis   . Pulmonary embolism ([place])   . Scoliosis   . UTI (urinary tract infection)     Medications:  Warfarin 2.5mg  Sun/Thur and 5mg  all other days For PE hx and strong family hx of hypercoagulable disease  Assessment: INR on admission 3.8, HGB 9.2 on admission  Pt reversed for emergent surgery 4/13, now resuming warfarin therapy with lovenox bridge (per discussion with surgery)  0413 0236 Vit K 10mg  x 1 0413 0235 Received KCentra 1500 units IV once 0413 0347 INR 1.2 0414 0520 INR 1.3 0414 0636 HGB  8.0 0415 INR 1.3   Warfarin 7.5 mg 0416 INR 1.5  Goal of Therapy:  INR 2-3 Monitor platelets by anticoagulation protocol: Yes   Plan:  - Will increase enoxaparin from 30mg  q24 to 40mg  q24 (renally adjusted therapeutic dose)  - Continue enoxaparin until INR therapeutic x 2  - subtherapeutic INR but increased. Will order Warfarin 7.5mg  x 1 today - INR daily  - CBC's/SCr a minimum of every 3 days  - Resume home dosing as above when patient ready for discharge  Noralee Space, PharmD Clinical Pharmacist 03/13/2021 11:26 AM

## 2021-03-13 NOTE — Plan of Care (Signed)
Continuing with plan of care. 

## 2021-03-13 NOTE — Progress Notes (Signed)
[place] Hospital Day(s): 3.   Post op day(s): 3 Days Post-Op.   Interval History:  Patient seen and examined No acute events or new complaints overnight.  Patient reports she has incisional soreness but markedly improved, only needing tylenol for pain No fever, chills, nausea, emesis No new labs this morning aside from INR Surgical drain was removed yesterday. She has tolerated regular diet without issues She is passing flatus, and having diarrhea that bothers her, multiple episodes.  She is taken Imodium at home and has requesting more.  We will also discontinue her MiraLAX and her Colace. Worked with PT; no follow up needed  Vital signs in last 24 hours: [min-max] current  Temp:  [97.3 F (36.3 C)-98.6 F (37 C)] 97.7 F (36.5 C) (04/16 0431) Pulse Rate:  [83-91] 83 (04/16 0431) Resp:  [16-20] 16 (04/16 0431) BP: (104-114)/(62-75) 112/75 (04/16 0431) SpO2:  [96 %-99 %] 96 % (04/16 0431)     Height: 5\' 1"  (154.9 cm) Weight: 40.8 kg BMI (Calculated): 17.01   Intake/Output last 2 shifts:  04/15 0701 - 04/16 0700 In: 478.9 [P.O.:360; IV Piggyback:118.9] Out: 50 [Drains:50]   Physical Exam:  Constitutional: alert, cooperative and no distress  Respiratory: breathing non-labored at rest  Cardiovascular: regular rate and sinus rhythm  Gastrointestinal: Soft, incisional soreness improvement, non-distended, no rebound/guarding. Surgical drain site dressing intact. Integumentary: Laparotomy incision is CDI with staples, no erythema.   Labs:  CBC Latest Ref Rng & Units 03/11/2021 03/09/2021  WBC 4.0 - 10.5 K/uL 9.5 6.6  Hemoglobin 12.0 - 15.0 g/dL 8.0(L) 9.2(L)  Hematocrit 36.0 - 46.0 % 25.5(L) 29.8(L)  Platelets 150 - 400 K/uL 379 436(H)   CMP Latest Ref Rng & Units 03/11/2021 03/09/2021  Glucose 70 - 99 mg/dL 83 111(H)  BUN 8 - 23 mg/dL 22 22  Creatinine 0.44 - 1.00 mg/dL 0.97 0.89  Sodium 135 - 145 mmol/L 136 134(L)  Potassium 3.5  - 5.1 mmol/L 4.2 4.4  Chloride 98 - 111 mmol/L 103 99  CO2 22 - 32 mmol/L 24 29  Calcium 8.9 - 10.3 mg/dL 8.5(L) 9.0  Total Protein 6.5 - 8.1 g/dL - 6.5  Total Bilirubin 0.3 - 1.2 mg/dL - 0.5  Alkaline Phos 38 - 126 U/L - 84  AST 15 - 41 U/L - 25  ALT 0 - 44 U/L - 14   INR: 1.5  Imaging studies: No new pertinent imaging studies   Assessment/Plan:  87 y.o. female with return of bowel function 3 Days Post-Op s/p exploratory laparotomy and lysis of adhesions for mesenteric volvulus.    -Appears to be doing well on soft diet.             - Monitor abdominal examination; on-going bowel function, will hold MiraLAX and Colace and begin as needed Imodium.             - Pain control prn; antiemetics prn             - Encouraged mobilization; PT evaluated and had no recommendations  - Continue Lovenox; transition to Warfarin when amenable     - Anticipate ready for discharge in the next 24 hours; follow up next Friday (04/22) for staple removal with Dr Hampton Abbot  All of the above findings and recommendations were discussed with the patient, and the medical team, and all of patient's questions were answered to her expressed satisfaction.  -- Ronny Bacon, M.D., [facility]  03/13/2021 ;  7:58 AM

## 2021-03-14 LAB — CBC
HCT: 24.6 % — ABNORMAL LOW (ref 36.0–46.0)
Hemoglobin: 7.9 g/dL — ABNORMAL LOW (ref 12.0–15.0)
MCH: 27.9 pg (ref 26.0–34.0)
MCHC: 32.1 g/dL (ref 30.0–36.0)
MCV: 86.9 fL (ref 80.0–100.0)
Platelets: 391 10*3/uL (ref 150–400)
RBC: 2.83 MIL/uL — ABNORMAL LOW (ref 3.87–5.11)
RDW: 17.6 % — ABNORMAL HIGH (ref 11.5–15.5)
WBC: 6.3 10*3/uL (ref 4.0–10.5)
nRBC: 0 % (ref 0.0–0.2)

## 2021-03-14 LAB — PROTIME-INR
INR: 2.2 — ABNORMAL HIGH (ref 0.8–1.2)
Prothrombin Time: 24.2 seconds — ABNORMAL HIGH (ref 11.4–15.2)

## 2021-03-14 MED ORDER — WARFARIN SODIUM 2.5 MG PO TABS: ORAL_TABLET | ORAL | 11 refills | Status: DC

## 2021-03-14 MED ORDER — WARFARIN SODIUM 5 MG PO TABS
5.0000 mg | ORAL_TABLET | Freq: Once | ORAL | Status: DC
  Filled 2021-03-14: qty 1

## 2021-03-14 NOTE — Plan of Care (Signed)
  Problem: Clinical Measurements: Goal: Ability to maintain clinical measurements within normal limits will improve Outcome: Progressing   Problem: Activity: Goal: Risk for activity intolerance will decrease Outcome: Progressing   Problem: Clinical Measurements: Goal: Respiratory complications will improve Outcome: Completed/Met Goal: Cardiovascular complication will be avoided Outcome: Completed/Met

## 2021-03-14 NOTE — Discharge Summary (Signed)
Physician Discharge Summary  Patient ID: Kristina Rowe MRN: [id number] DOB/AGE: [birth year] 87 y.o.  Admit date: 03/09/2021 Discharge date: 03/14/2021  Admission Diagnoses: Same  Discharge Diagnoses:  Active Problems:   Volvulus of intestine [facility])   Discharged Condition: good  Hospital Course: Underwent exploratory laparotomy for intestinal volvulus.  Had compensatory postoperative diarrhea which was controlled with as needed Imodium.  She was gradually advanced on a diet, and has reached a therapeutic INR.  She is ambulating.  And she desires to go home.  She has follow-up with Coumadin clinic with [facility].  And will resume her prehospital warfarin of 2.5 mg on Sundays and Thursdays.  She will take 5 mg all the other remaining days.  Consults: Pharmacy for warfarin management.  Significant Diagnostic Studies: radiology: CT scan: See report  Treatments: surgery: Lysis of adhesions with Dr. Hampton Abbot on March 10, 2021.  Discharge Exam: Blood pressure 130/82, pulse 83, temperature 97.6 F (36.4 C), temperature source Oral, resp. rate 18, height 5\' 1"  (1.549 m), weight 40.8 kg, SpO2 97 %. General appearance: alert, cooperative and no distress Resp: clear to auscultation bilaterally Cardio: regular rate and rhythm GI: soft, non-tender; bowel sounds normal; no masses,  no organomegaly Incision/Wound: Stapled incisions clean, dry and intact.  Drain site without erythema, or induration.  Disposition: Discharge disposition: 01-Home or Self Care       Discharge Instructions    Call MD for:  persistant nausea and vomiting   Complete by: As directed    Call MD for:  redness, tenderness, or signs of infection (pain, swelling, redness, odor or green/yellow discharge around incision site)   Complete by: As directed    Call MD for:  severe uncontrolled pain   Complete by: As directed    Diet - low sodium heart healthy   Complete by: As directed    Discharge instructions   Complete  by: As directed    May shower with drain dressing off, may replace as needed.   Driving Restrictions   Complete by: As directed    No driving until cleared after follow-up appointment.  Is not advised to drive while taking narcotic pain medications or in significant pain.   Increase activity slowly   Complete by: As directed    Lifting restrictions   Complete by: As directed    Strongly advised against any form of lifting greater than 15 pounds over the next 4 to 6 weeks.  This involves pushing/pulling movements as well.  After 4 weeks when may gradually engage in more activities remaining aware of any new pain/tenderness elicited, and avoiding those for the full duration of 6 weeks.  Walking is encouraged.  Climbing stairs with caution.     Allergies as of 03/14/2021      Reactions   Metronidazole Other (See Comments)   Other reaction(s): Other Mouth sores Mouth sores Mouth sores   Omeprazole Other (See Comments)   Other reaction(s): Arthralgias (intolerance) Other reaction(s): Other   Bactrim [sulfamethoxazole-trimethoprim] Nausea Only   Codeine Nausea And Vomiting, Nausea Only   Other reaction(s): Nausea Only But tolerates tramadol      Medication List    TAKE these medications   acetaminophen 500 MG tablet Commonly known as: TYLENOL Take 500 mg by mouth at bedtime as needed.   amitriptyline 10 MG tablet Commonly known as: ELAVIL Take 10 mg by mouth at bedtime.   amLODipine 2.5 MG tablet Commonly known as: NORVASC Take 2.5 mg by mouth daily.  amoxicillin-clavulanate 875-125 MG tablet Commonly known as: AUGMENTIN Take 1 tablet by mouth 2 (two) times daily for 5 days.   calcium carbonate 1250 (500 Ca) MG chewable tablet Commonly known as: OS-CAL Chew 1 tablet by mouth daily.   Cholecalciferol 50 MCG (2000 UT) Caps Take 1 capsule by mouth daily.   cyanocobalamin 1000 MCG tablet Take 1,000 mcg by mouth daily.   diclofenac Sodium 1 % Gel Commonly known as:  VOLTAREN Apply topically 4 (four) times daily.   econazole nitrate 1 % cream Apply 1 application topically daily.   estradiol 0.1 MG/GM vaginal cream Commonly known as: ESTRACE Estrogen Cream Instruction Discard applicator Apply pea sized amount to tip of finger to urethra before bed. Wash hands well after application. Use Monday, Wednesday and Friday   folic acid 1 MG tablet Commonly known as: FOLVITE TAKE 1 TABLET(1 MG) BY MOUTH DAILY   loperamide 2 MG capsule Commonly known as: IMODIUM Take by mouth as needed for diarrhea or loose stools.   mirtazapine 30 MG tablet Commonly known as: REMERON Take 30 mg by mouth at bedtime.   pantoprazole 40 MG tablet Commonly known as: PROTONIX Take 40 mg by mouth daily.   promethazine 25 MG tablet Commonly known as: PHENERGAN Take 25 mg by mouth every 6 (six) hours as needed for nausea or vomiting.   SUMAtriptan 50 MG tablet Commonly known as: IMITREX Take 1 at onset of headache. May repeat once in two hours. Do not exceed 2 per day or 4 per week.   tolterodine 4 MG 24 hr capsule Commonly known as: DETROL LA Take 1 capsule (4 mg total) by mouth daily.   warfarin 2.5 MG tablet Commonly known as: Coumadin On Sundays and Thursdays resume the 2.5 mg dose.  All other days take 5 mg.       Follow-up Information    Olean Ree, MD. Schedule an appointment as soon as possible for a visit on 03/19/2021.   Specialty: General Surgery Why: s/p exploratory laparotomy, sigmoid volvulus repair, has staples Contact information: [address] [place] [address] [phone]               Signed: Ronny Bacon, M.D., [facility] 03/14/2021, 9:12 AM

## 2021-03-14 NOTE — Plan of Care (Signed)
Discharge order received. Patient mental status is at baseline. Vital signs stable . No signs of acute distress. Discharge instructions given. Patient verbalized understanding. No other issues noted at this time.   

## 2021-03-14 NOTE — Consult Note (Signed)
[place] for warfarin with lovenox bridge Indication:  PE hx and strong family hx of hypercoagulable disease  Allergies  Allergen Reactions  . Metronidazole Other (See Comments)    Other reaction(s): Other Mouth sores Mouth sores Mouth sores   . Omeprazole Other (See Comments)    Other reaction(s): Arthralgias (intolerance) Other reaction(s): Other   . Bactrim [Sulfamethoxazole-Trimethoprim] Nausea Only  . Codeine Nausea And Vomiting and Nausea Only    Other reaction(s): Nausea Only But tolerates tramadol     Patient Measurements: Height: 5\' 1"  (154.9 cm) Weight: 40.8 kg (90 lb) IBW/kg (Calculated) : 47.8  Vital Signs: Temp: 97.6 F (36.4 C) (04/17 0521) Temp Source: Oral (04/17 0521) BP: 109/72 (04/17 0945) Pulse Rate: 92 (04/17 0945)  Labs: Recent Labs    03/12/21 0519 03/13/21 1036 03/14/21 0459  HGB  --  8.9* 7.9*  HCT  --  28.4* 24.6*  PLT  --  462* 391  LABPROT 16.3* 17.8* 24.2*  INR 1.3* 1.5* 2.2*    Estimated Creatinine Clearance: 25.8 mL/min (by C-G formula based on SCr of 0.97 mg/dL).   Medical History: Past Medical History:  Diagnosis Date  . Ductal carcinoma in situ (DCIS) of left breast   . Dysphagia   . GERD (gastroesophageal reflux disease)   . Hiatal hernia   . Hypertension   . Iron deficiency anemia   . Osteoporosis   . Pulmonary embolism ([place])   . Scoliosis   . UTI (urinary tract infection)     Medications:  Warfarin 2.5mg  Sun/Thur and 5mg  all other days For PE hx and strong family hx of hypercoagulable disease  Assessment: INR on admission 3.8, HGB 9.2 on admission  Pt reversed for emergent surgery 4/13, now resuming warfarin therapy with lovenox bridge (per discussion with surgery)  0413 0236 Vit K 10mg  x 1 0413 0235 Received KCentra 1500 units IV once 0413 0347 INR 1.2 0414 0520 INR 1.3 0414 0636 HGB 8.0 0415 INR 1.3   Warfarin 7.5 mg 0416 INR 1.5 7.5 mg 0417 INR 2.2   Goal  of Therapy:  INR 2-3 Monitor platelets by anticoagulation protocol: Yes   Plan:  - Will increase enoxaparin from 30mg  q24 to 40mg  q24 (renally adjusted therapeutic dose)  - Continue enoxaparin until INR therapeutic x 2  -therapeutic INR x1. Will order Warfarin 5mg  x 1 today - INR daily  - CBC's/SCr a minimum of every 3 days  - Resume home dosing as above when patient ready for discharge  Noralee Space, PharmD Clinical Pharmacist 03/14/2021 11:06 AM

## 2021-03-15 ENCOUNTER — Emergency Department: Payer: Medicare Other

## 2021-03-15 ENCOUNTER — Inpatient Hospital Stay
Admission: EM | Admit: 2021-03-15 | Discharge: 2021-03-23 | DRG: 330 | Disposition: A | Discharge status: Home Health | Payer: Medicare Other | Attending: Internal Medicine | Admitting: Internal Medicine

## 2021-03-15 DIAGNOSIS — D649 Anemia, unspecified: Secondary | ICD-10-CM | POA: Diagnosis present

## 2021-03-15 DIAGNOSIS — Z66 Do not resuscitate: Secondary | ICD-10-CM | POA: Diagnosis not present

## 2021-03-15 DIAGNOSIS — Z86000 Personal history of in-situ neoplasm of breast: Secondary | ICD-10-CM

## 2021-03-15 DIAGNOSIS — K625 Hemorrhage of anus and rectum: Secondary | ICD-10-CM | POA: Diagnosis not present

## 2021-03-15 DIAGNOSIS — K6389 Other specified diseases of intestine: Secondary | ICD-10-CM | POA: Diagnosis present

## 2021-03-15 DIAGNOSIS — M419 Scoliosis, unspecified: Secondary | ICD-10-CM | POA: Diagnosis present

## 2021-03-15 DIAGNOSIS — Z881 Allergy status to other antibiotic agents status: Secondary | ICD-10-CM

## 2021-03-15 DIAGNOSIS — Z86711 Personal history of pulmonary embolism: Secondary | ICD-10-CM

## 2021-03-15 DIAGNOSIS — I2699 Other pulmonary embolism without acute cor pulmonale: Secondary | ICD-10-CM | POA: Diagnosis present

## 2021-03-15 DIAGNOSIS — R9389 Abnormal findings on diagnostic imaging of other specified body structures: Secondary | ICD-10-CM

## 2021-03-15 DIAGNOSIS — C188 Malignant neoplasm of overlapping sites of colon: Secondary | ICD-10-CM | POA: Diagnosis not present

## 2021-03-15 DIAGNOSIS — K449 Diaphragmatic hernia without obstruction or gangrene: Secondary | ICD-10-CM | POA: Diagnosis present

## 2021-03-15 DIAGNOSIS — Z888 Allergy status to other drugs, medicaments and biological substances status: Secondary | ICD-10-CM

## 2021-03-15 DIAGNOSIS — R791 Abnormal coagulation profile: Secondary | ICD-10-CM

## 2021-03-15 DIAGNOSIS — M810 Age-related osteoporosis without current pathological fracture: Secondary | ICD-10-CM | POA: Diagnosis present

## 2021-03-15 DIAGNOSIS — I10 Essential (primary) hypertension: Secondary | ICD-10-CM | POA: Diagnosis present

## 2021-03-15 DIAGNOSIS — Z20822 Contact with and (suspected) exposure to covid-19: Secondary | ICD-10-CM | POA: Diagnosis present

## 2021-03-15 DIAGNOSIS — N179 Acute kidney failure, unspecified: Secondary | ICD-10-CM | POA: Diagnosis not present

## 2021-03-15 DIAGNOSIS — C182 Malignant neoplasm of ascending colon: Principal | ICD-10-CM | POA: Diagnosis present

## 2021-03-15 DIAGNOSIS — K219 Gastro-esophageal reflux disease without esophagitis: Secondary | ICD-10-CM | POA: Diagnosis present

## 2021-03-15 DIAGNOSIS — Z885 Allergy status to narcotic agent status: Secondary | ICD-10-CM

## 2021-03-15 DIAGNOSIS — K91840 Postprocedural hemorrhage and hematoma of a digestive system organ or structure following a digestive system procedure: Secondary | ICD-10-CM

## 2021-03-15 DIAGNOSIS — K9189 Other postprocedural complications and disorders of digestive system: Secondary | ICD-10-CM

## 2021-03-15 DIAGNOSIS — D638 Anemia in other chronic diseases classified elsewhere: Secondary | ICD-10-CM | POA: Diagnosis present

## 2021-03-15 DIAGNOSIS — K5730 Diverticulosis of large intestine without perforation or abscess without bleeding: Secondary | ICD-10-CM | POA: Diagnosis present

## 2021-03-15 DIAGNOSIS — Z79899 Other long term (current) drug therapy: Secondary | ICD-10-CM

## 2021-03-15 DIAGNOSIS — Z7901 Long term (current) use of anticoagulants: Secondary | ICD-10-CM

## 2021-03-15 LAB — COMPREHENSIVE METABOLIC PANEL
ALT: 14 U/L (ref 0–44)
AST: 26 U/L (ref 15–41)
Albumin: 3.2 g/dL — ABNORMAL LOW (ref 3.5–5.0)
Alkaline Phosphatase: 66 U/L (ref 38–126)
Anion gap: 7 (ref 5–15)
BUN: 9 mg/dL (ref 8–23)
CO2: 28 mmol/L (ref 22–32)
Calcium: 9.2 mg/dL (ref 8.9–10.3)
Chloride: 102 mmol/L (ref 98–111)
Creatinine, Ser: 0.87 mg/dL (ref 0.44–1.00)
GFR, Estimated: >60 mL/min (ref >60)
Glucose, Bld: 102 mg/dL — ABNORMAL HIGH (ref 70–99)
Potassium: 3.9 mmol/L (ref 3.5–5.1)
Sodium: 137 mmol/L (ref 135–145)
Total Bilirubin: 0.5 mg/dL (ref 0.3–1.2)
Total Protein: 6.6 g/dL (ref 6.5–8.1)

## 2021-03-15 LAB — AEROBIC/ANAEROBIC CULTURE W GRAM STAIN (SURGICAL/DEEP WOUND)
Culture: NO GROWTH
Gram Stain: NONE SEEN

## 2021-03-15 LAB — CBC WITH DIFFERENTIAL/PLATELET
Abs Immature Granulocytes: 0.02 10*3/uL (ref 0.00–0.07)
Basophils Absolute: 0 10*3/uL (ref 0.0–0.1)
Basophils Relative: 1 %
Eosinophils Absolute: 0.1 10*3/uL (ref 0.0–0.5)
Eosinophils Relative: 2 %
HCT: 29.8 % — ABNORMAL LOW (ref 36.0–46.0)
Hemoglobin: 9.4 g/dL — ABNORMAL LOW (ref 12.0–15.0)
Immature Granulocytes: 0 %
Lymphocytes Relative: 23 %
Lymphs Abs: 1.3 10*3/uL (ref 0.7–4.0)
MCH: 27.9 pg (ref 26.0–34.0)
MCHC: 31.5 g/dL (ref 30.0–36.0)
MCV: 88.4 fL (ref 80.0–100.0)
Monocytes Absolute: 0.3 10*3/uL (ref 0.1–1.0)
Monocytes Relative: 6 %
Neutro Abs: 3.8 10*3/uL (ref 1.7–7.7)
Neutrophils Relative %: 68 %
Platelets: 498 10*3/uL — ABNORMAL HIGH (ref 150–400)
RBC: 3.37 MIL/uL — ABNORMAL LOW (ref 3.87–5.11)
RDW: 18 % — ABNORMAL HIGH (ref 11.5–15.5)
WBC: 5.6 10*3/uL (ref 4.0–10.5)
nRBC: 0 % (ref 0.0–0.2)

## 2021-03-15 LAB — SAMPLE TO BLOOD BANK

## 2021-03-15 LAB — RESP PANEL BY RT-PCR (FLU A&B, COVID) ARPGX2
Influenza A by PCR: NEGATIVE
Influenza B by PCR: NEGATIVE
SARS Coronavirus 2 by RT PCR: NEGATIVE

## 2021-03-15 LAB — URINALYSIS, ROUTINE W REFLEX MICROSCOPIC
Bilirubin Urine: NEGATIVE
Glucose, UA: NEGATIVE mg/dL
Hgb urine dipstick: NEGATIVE
Ketones, ur: NEGATIVE mg/dL
Leukocytes,Ua: NEGATIVE
Nitrite: NEGATIVE
Protein, ur: NEGATIVE mg/dL
Specific Gravity, Urine: 1.039 — ABNORMAL HIGH (ref 1.005–1.030)
pH: 5 (ref 5.0–8.0)

## 2021-03-15 LAB — TYPE AND SCREEN
ABO/RH(D): O NEG
Antibody Screen: NEGATIVE

## 2021-03-15 LAB — LIPASE, BLOOD: Lipase: 42 U/L (ref 11–51)

## 2021-03-15 LAB — HEMOGLOBIN AND HEMATOCRIT, BLOOD
HCT: 26.5 % — ABNORMAL LOW (ref 36.0–46.0)
Hemoglobin: 8.1 g/dL — ABNORMAL LOW (ref 12.0–15.0)

## 2021-03-15 LAB — PROTIME-INR
INR: 2.2 — ABNORMAL HIGH (ref 0.8–1.2)
Prothrombin Time: 24 seconds — ABNORMAL HIGH (ref 11.4–15.2)

## 2021-03-15 IMAGING — CT CT ABD-PELV W/ CM
2 of 5 series · 16 of 46 positions shown, 18 images · IV contrast (APPLIED)
Comparison: Five days ago

CLINICAL DATA: Rectal bleeding that started on [REDACTED]. Recent
volvulus surgery

EXAM:
CT ABDOMEN AND PELVIS WITH CONTRAST
TECHNIQUE: Multidetector CT imaging of the abdomen and pelvis was performed
using the standard protocol following bolus administration of
intravenous contrast.
CONTRAST:  75mL OMNIPAQUE IOHEXOL 300 MG/ML  SOLN

[Series 2: routine abd/pel with · axial · 0.77mm/px · z∈[-1019,-639]mm · 13 of 86 slices shown, 15 images]
[im 5/86  soft-tissue]
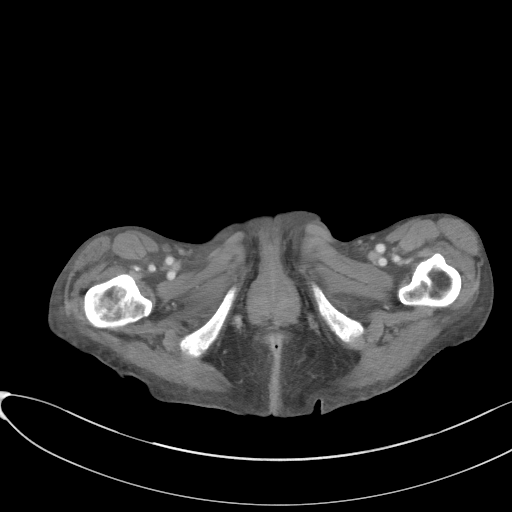
[im 5/86  bone]
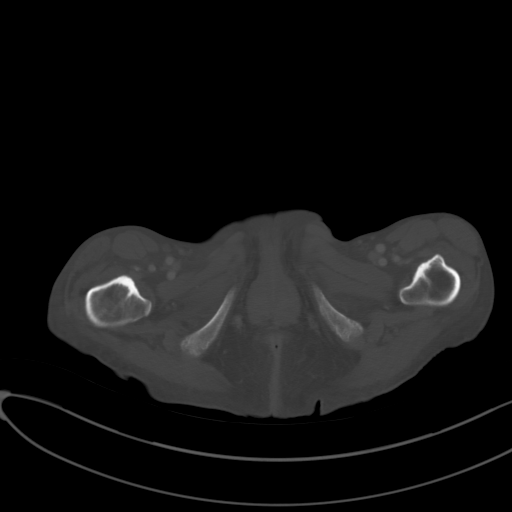
[im 14/86  soft-tissue]
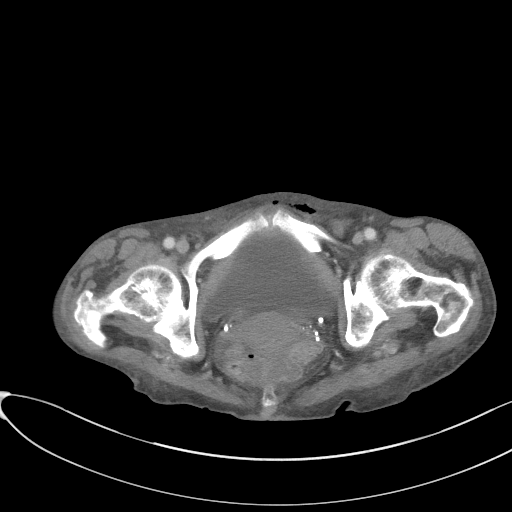
[im 18/86  soft-tissue]
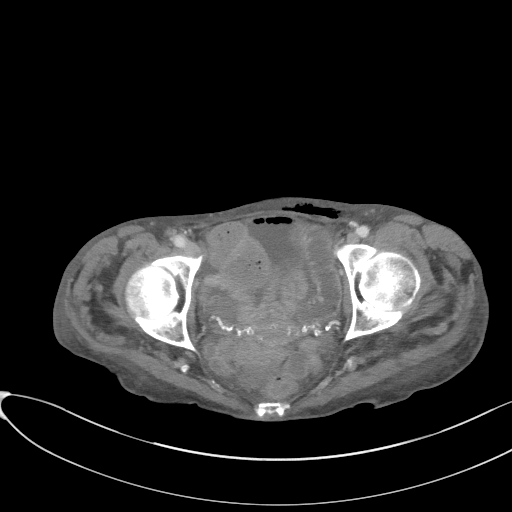
[im 23/86  soft-tissue]
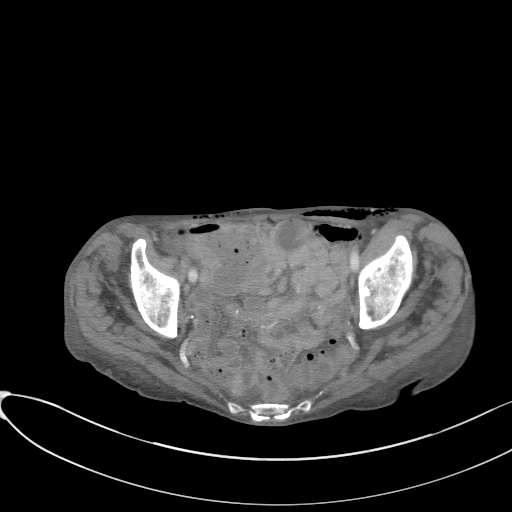
[im 32/86  soft-tissue]
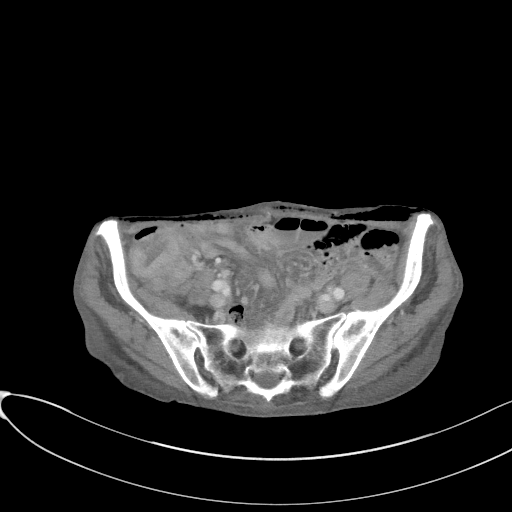
[im 36/86  soft-tissue]
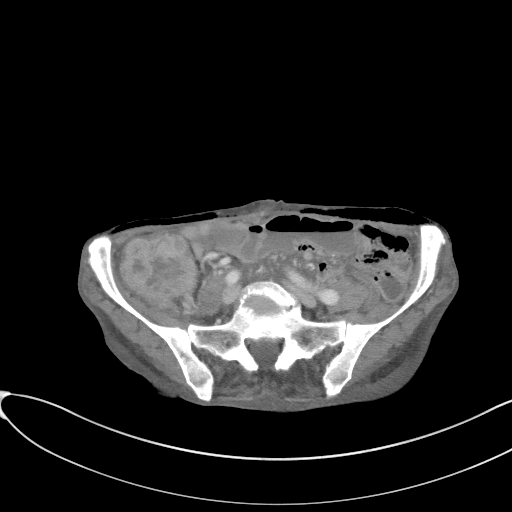
[im 45/86  soft-tissue]
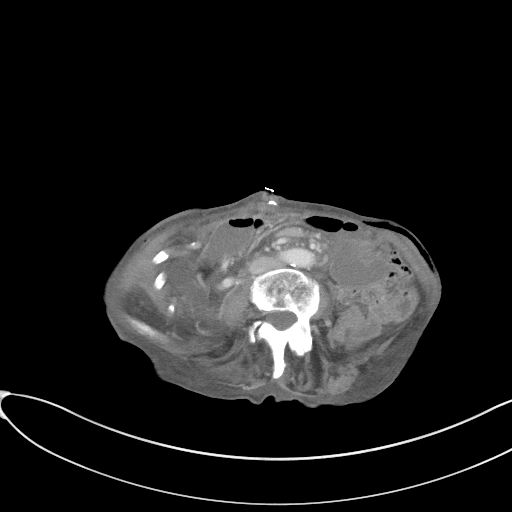
[im 50/86  soft-tissue]
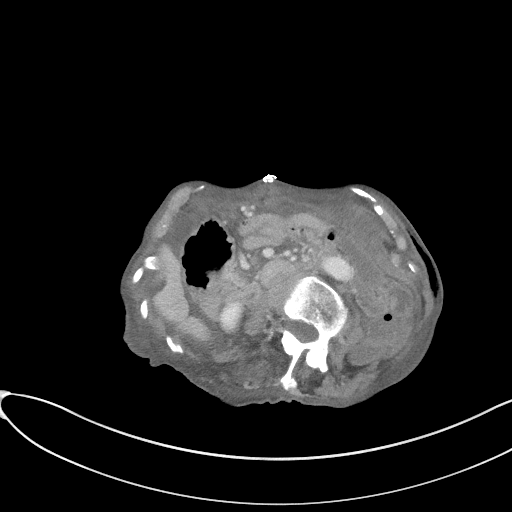
[im 54/86  soft-tissue]
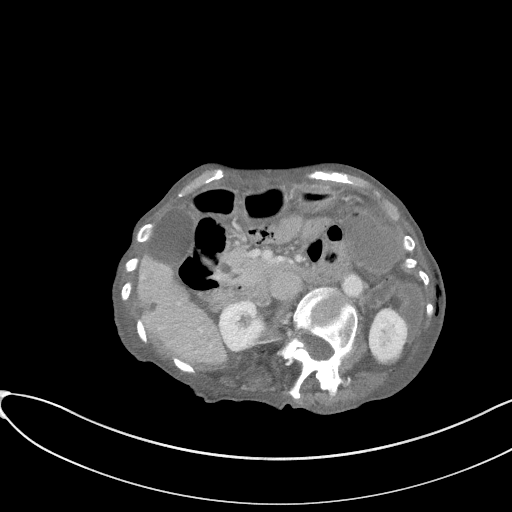
[im 54/86  bone]
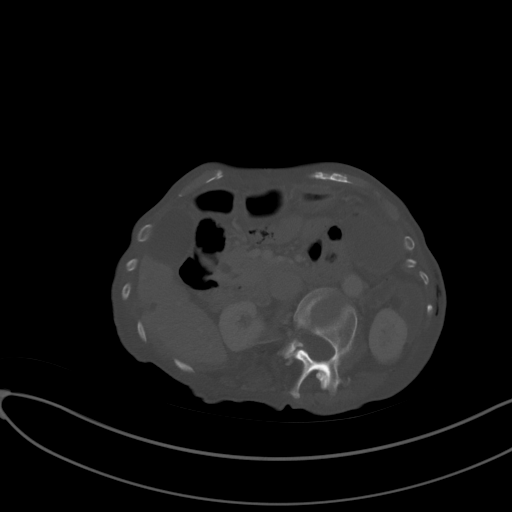
[im 63/86  soft-tissue]
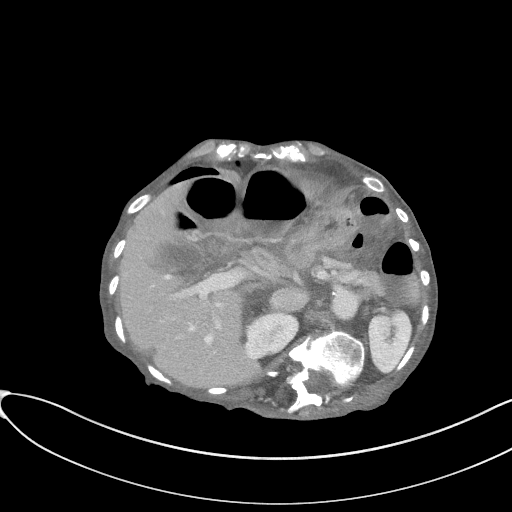
[im 68/86  soft-tissue]
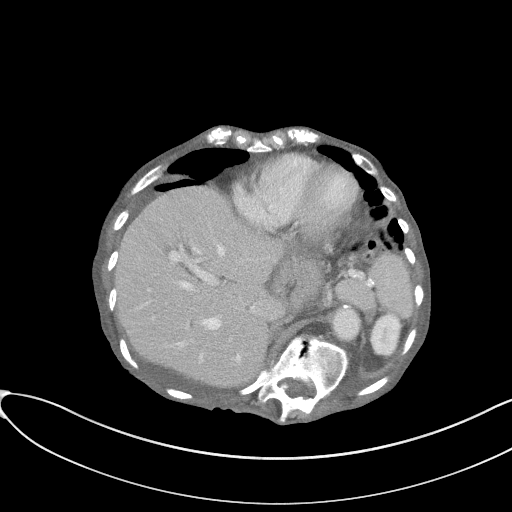
[im 72/86  soft-tissue]
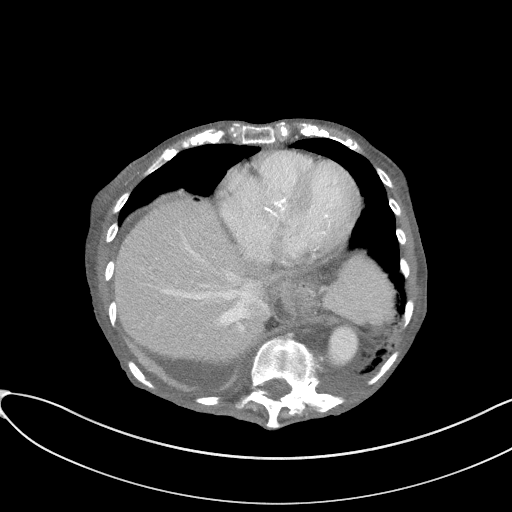
[im 81/86  soft-tissue]
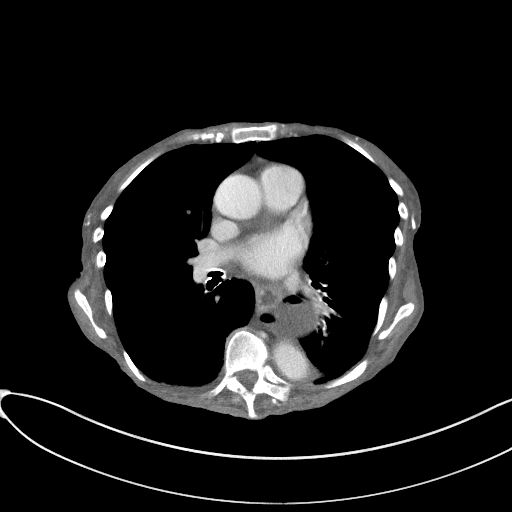

[Series 5: coronal st · coronal · 0.69mm/px · 3 of 81 slices shown]
[im 27/81  soft-tissue]
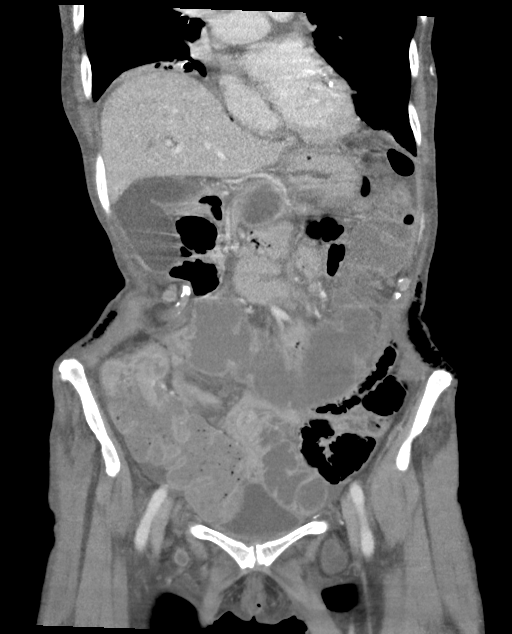
[im 36/81  soft-tissue]
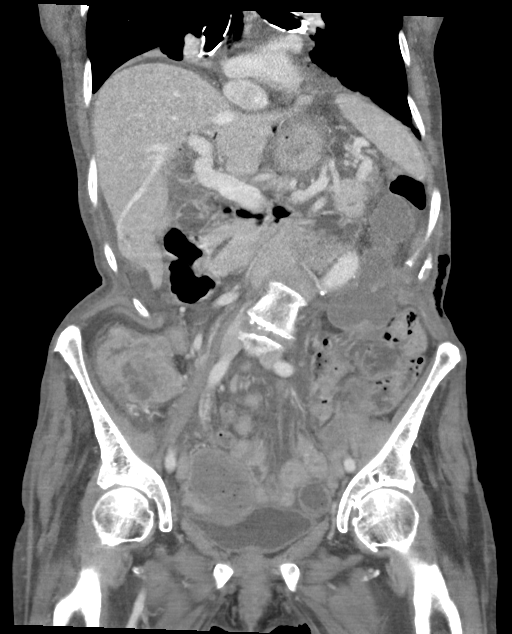
[im 45/81  soft-tissue]
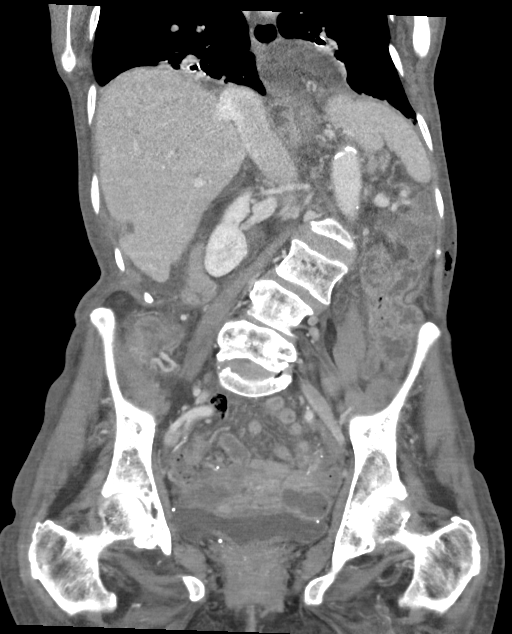

[16 of 46 positions shown; findings below may reference images not displayed]

FINDINGS: Lower chest: Trace pleural effusions and bilateral lower lobe
atelectasis. Right middle lobe is collapsed with a chronic
appearance in the setting of marked scoliosis. Mitral and aortic
annular calcification. Aortic valve calcification.

Hepatobiliary: No focal liver abnormality.No evidence of biliary
obstruction or stone.

Pancreas: Unremarkable.

Spleen: Unremarkable.

Adrenals/Urinary Tract: Negative adrenals. No hydronephrosis or
stone. Unremarkable bladder.

Stomach/Bowel: No obstruction or recurrent volvulus appearance.
Masslike thickening the ascending colon with indistinguishable
medial mass and adjacent ileocolic vessels, possibly extra serosal
extension. The mass measures at least 6 cm in length.

Vascular/Lymphatic: Less than typical atheromatous changes for age.
Accentuated ileocolic vessels around the described mass. Ileocolic
lymph nodes measure up to 13 x 8 mm.

Reproductive:Unremarkable for age

Other: Small volume pneumoperitoneum, expected after recent surgery.
Abdominal wall gas which is also expected.

Fluid density collection in the left groin related to hernia,
possibly thin oral. No internal bowel is seen traversing the neck.

Musculoskeletal: Advanced spinal degeneration and levoscoliosis.
IMPRESSION: 1. Bulky ascending colon mass compatible with carcinoma. Possible
ileocolic adenopathy and extra serosal extension.
2. No recurrent volvulus.
3. Left groin hernia, potentially femoral.
4. Numerous chronic findings are described above.

## 2021-03-15 MED ORDER — SUMATRIPTAN SUCCINATE 50 MG PO TABS: 50.0000 mg | ORAL_TABLET | ORAL | Status: DC

## 2021-03-15 MED ORDER — CALCIUM CARBONATE 1250 (500 CA) MG PO TABS
1250.0000 mg | ORAL_TABLET | Freq: Every day | ORAL | Status: DC
  Administered 2021-03-16 – 2021-03-23 (×5): 1250 mg via ORAL
  Filled 2021-03-15 (×9): qty 1

## 2021-03-15 MED ORDER — SODIUM CHLORIDE 0.9% FLUSH: 3.0000 mL | INTRAVENOUS | Status: DC | PRN

## 2021-03-15 MED ORDER — PANTOPRAZOLE SODIUM 40 MG PO TBEC
40.0000 mg | DELAYED_RELEASE_TABLET | Freq: Every day | ORAL | Status: DC
  Administered 2021-03-15 – 2021-03-23 (×8): 40 mg via ORAL
  Filled 2021-03-15 (×8): qty 1

## 2021-03-15 MED ORDER — VITAMIN B-12 1000 MCG PO TABS
1000.0000 ug | ORAL_TABLET | Freq: Every day | ORAL | Status: DC
  Administered 2021-03-15 – 2021-03-23 (×6): 1000 ug via ORAL
  Filled 2021-03-15 (×8): qty 1

## 2021-03-15 MED ORDER — MIRTAZAPINE 15 MG PO TABS
30.0000 mg | ORAL_TABLET | Freq: Every day | ORAL | Status: DC
  Administered 2021-03-15 – 2021-03-22 (×7): 30 mg via ORAL
  Filled 2021-03-15 (×8): qty 2

## 2021-03-15 MED ORDER — VITAMIN D3 25 MCG (1000 UNIT) PO TABS
1000.0000 [IU] | ORAL_TABLET | Freq: Every day | ORAL | Status: DC
  Administered 2021-03-15 – 2021-03-23 (×8): 1000 [IU] via ORAL
  Filled 2021-03-15 (×14): qty 1

## 2021-03-15 MED ORDER — SODIUM CHLORIDE 0.9 % IV SOLN
250.0000 mL | INTRAVENOUS | Status: DC | PRN
  Administered 2021-03-17: 1000 mL via INTRAVENOUS

## 2021-03-15 MED ORDER — ONDANSETRON HCL 4 MG/2ML IJ SOLN
4.0000 mg | Freq: Four times a day (QID) | INTRAMUSCULAR | Status: DC | PRN
  Administered 2021-03-18 – 2021-03-20 (×2): 4 mg via INTRAVENOUS
  Filled 2021-03-15 (×2): qty 2

## 2021-03-15 MED ORDER — ACETAMINOPHEN 500 MG PO TABS
500.0000 mg | ORAL_TABLET | Freq: Every evening | ORAL | Status: DC | PRN
  Administered 2021-03-16: 500 mg via ORAL
  Filled 2021-03-15: qty 1

## 2021-03-15 MED ORDER — SODIUM CHLORIDE 0.9% FLUSH
3.0000 mL | Freq: Two times a day (BID) | INTRAVENOUS | Status: DC
  Administered 2021-03-15 – 2021-03-22 (×12): 3 mL via INTRAVENOUS

## 2021-03-15 MED ORDER — FOLIC ACID 1 MG PO TABS
1.0000 mg | ORAL_TABLET | Freq: Every day | ORAL | Status: DC
  Administered 2021-03-16 – 2021-03-23 (×7): 1 mg via ORAL
  Filled 2021-03-15 (×7): qty 1

## 2021-03-15 MED ORDER — ESTRADIOL 0.1 MG/GM VA CREA
1.0000 | TOPICAL_CREAM | VAGINAL | Status: DC
  Administered 2021-03-15 – 2021-03-17 (×2): 1 via VAGINAL
  Filled 2021-03-15 (×2): qty 42.5

## 2021-03-15 MED ORDER — ONDANSETRON HCL 4 MG PO TABS: 4.0000 mg | ORAL_TABLET | Freq: Four times a day (QID) | ORAL | Status: DC | PRN

## 2021-03-15 MED ORDER — VITAMIN K1 10 MG/ML IJ SOLN
5.0000 mg | Freq: Once | INTRAVENOUS | Status: AC
  Administered 2021-03-15: 5 mg via INTRAVENOUS
  Filled 2021-03-15: qty 0.5

## 2021-03-15 MED ORDER — AMITRIPTYLINE HCL 10 MG PO TABS
10.0000 mg | ORAL_TABLET | Freq: Every day | ORAL | Status: DC
  Administered 2021-03-15 – 2021-03-22 (×7): 10 mg via ORAL
  Filled 2021-03-15 (×10): qty 1

## 2021-03-15 MED ORDER — IOHEXOL 300 MG/ML  SOLN
75.0000 mL | Freq: Once | INTRAMUSCULAR | Status: AC | PRN
  Administered 2021-03-15: 75 mL via INTRAVENOUS

## 2021-03-15 NOTE — ED Notes (Signed)
Resumed care from [facility].  Pt alert  Family with pt.  Pt waiting on admission bed.

## 2021-03-15 NOTE — ED Provider Notes (Signed)
[facility] Emergency Department Provider Note  ____________________________________________   Event Date/Time   First MD Initiated Contact with Patient 03/15/21 [phone]     (approximate)  I have reviewed the triage vital signs and the nursing notes.   HISTORY  Chief Complaint Rectal Bleeding    HPI Kristina Rowe is a 87 y.o. female with history of hypertension, anemia, PE on Coumadin who presents to the emergency department with rectal bleeding that started Friday the 15th.  Patient had surgery for a volvulus on Wednesday the 13th by Dr. Hampton Abbot.  She states she started having bloody stools 2 days later notified to her surgeon and was told this was normal.  She reports now she has had increased bleeding and has had multiple episodes in the past 2 to 3 hours with diarrhea.  No nausea, vomiting.  She has had dizziness.  No chest pain or shortness of breath.  She did resume taking her warfarin on Friday.        Past Medical History:  Diagnosis Date  . Ductal carcinoma in situ (DCIS) of left breast   . Dysphagia   . GERD (gastroesophageal reflux disease)   . Hiatal hernia   . Hypertension   . Iron deficiency anemia   . Osteoporosis   . Pulmonary embolism ([place])   . Scoliosis   . UTI (urinary tract infection)     Patient Active Problem List   Diagnosis Date Noted  . 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 Anemia, unspecified 03/06/2021    Past Surgical History:  Procedure Laterality Date  . BREAST LUMPECTOMY Left 2009   Ductal Carcinoma insitu   . CATARACT EXTRACTION, BILATERAL    . LAPAROSCOPIC PARAESOPHAGEAL HERNIA REPAIR  2009  . LAPAROTOMY N/A 03/10/2021   Procedure: EXPLORATORY LAPAROTOMY;  Surgeon: Olean Ree, MD;  Location: [facility] ORS;  Service: General;  Laterality: N/A;  . TUBAL LIGATION      Prior to Admission medications   Medication Sig Start Date End Date Taking? Authorizing Provider  acetaminophen (TYLENOL) 500 MG tablet Take  500 mg by mouth at bedtime as needed.    [provider]  amitriptyline (ELAVIL) 10 MG tablet Take 10 mg by mouth at bedtime.    [provider]  amLODipine (NORVASC) 2.5 MG tablet Take 2.5 mg by mouth daily. 02/24/21   [provider]  calcium carbonate (OS-CAL) 1250 (500 Ca) MG chewable tablet Chew 1 tablet by mouth daily. 04/20/09   [provider]  Cholecalciferol 50 MCG (2000 UT) CAPS Take 1 capsule by mouth daily.    [provider]  cyanocobalamin 1000 MCG tablet Take 1,000 mcg by mouth daily.    [provider]  diclofenac Sodium (VOLTAREN) 1 % GEL Apply topically 4 (four) times daily.    [provider]  econazole nitrate 1 % cream Apply 1 application topically daily.    [provider]  estradiol (ESTRACE) 0.1 MG/GM vaginal cream Estrogen Cream Instruction Discard applicator Apply pea sized amount to tip of finger to urethra before bed. Wash hands well after application. Use Monday, Wednesday and Friday 03/04/21   Billey Co, MD  folic acid (FOLVITE) 1 MG tablet TAKE 1 TABLET(1 MG) BY MOUTH DAILY 01/17/19   [provider]  loperamide (IMODIUM) 2 MG capsule Take by mouth as needed for diarrhea or loose stools.    [provider]  mirtazapine (REMERON) 30 MG tablet Take 30 mg by mouth at bedtime. 03/02/21  [provider]  pantoprazole (PROTONIX) 40 MG tablet Take 40 mg by mouth daily.    [provider]  promethazine (PHENERGAN) 25 MG tablet Take 25 mg by mouth every 6 (six) hours as needed for nausea or vomiting.    [provider]  SUMAtriptan (IMITREX) 50 MG tablet Take 1 at onset of headache. May repeat once in two hours. Do not exceed 2 per day or 4 per week. 03/15/19   [provider]  tolterodine (DETROL LA) 4 MG 24 hr capsule Take 1 capsule (4 mg total) by mouth daily. Patient not taking: Reported on 03/10/2021 02/09/21   Billey Co, MD  warfarin  (COUMADIN) 2.5 MG tablet On Sundays and Thursdays resume the 2.5 mg dose.  All other days take 5 mg. 03/14/21   Ronny Bacon, MD    Allergies Metronidazole, Omeprazole, Bactrim [sulfamethoxazole-trimethoprim], and Codeine  Family History  Problem Relation Age of Onset  . Breast cancer Neg Hx     Social History Social History   Tobacco Use  . Smoking status: Never Smoker  . Smokeless tobacco: Never Used  Substance Use Topics  . Alcohol use: Never  . Drug use: Never    Review of Systems Constitutional: No fever. Eyes: No visual changes. ENT: No sore throat. Cardiovascular: Denies chest pain. Respiratory: Denies shortness of breath. Gastrointestinal: No nausea, vomiting.  + diarrhea. Genitourinary: Negative for dysuria. Musculoskeletal: Negative for back pain. Skin: Negative for rash. Neurological: Negative for focal weakness or numbness.  ____________________________________________   PHYSICAL EXAM:  VITAL SIGNS: ED Triage Vitals  Enc Vitals Group     BP 03/15/21 0450 133/81     Pulse Rate 03/15/21 0450 87     Resp 03/15/21 0450 18     Temp 03/15/21 0450 98.1 F (36.7 C)     Temp Source 03/15/21 0450 Oral     SpO2 03/15/21 0444 99 %     Weight 03/15/21 0449 89 lb 15.2 oz (40.8 kg)     Height 03/15/21 0449 5\' 1"  (1.549 m)     Head Circumference --      Peak Flow --      Pain Score 03/15/21 0449 5     Pain Loc --      Pain Edu? --      Excl. in [place]? --    CONSTITUTIONAL: Alert and oriented and responds appropriately to questions.  Elderly, thin HEAD: Normocephalic EYES: Conjunctivae clear, pupils appear equal, EOM appear intact ENT: normal nose; moist mucous membranes NECK: Supple, normal ROM CARD: RRR; S1 and S2 appreciated; no murmurs, no clicks, no rubs, no gallops RESP: Normal chest excursion without splinting or tachypnea; breath sounds clear and equal bilaterally; no wheezes, no rhonchi, no rales, no hypoxia or respiratory distress, speaking full  sentences ABD/GI: Normal bowel sounds; non-distended; soft, tender to palpation diffusely throughout the abdomen, surgical incision is clean, dry and intact without bleeding RECTAL:  Normal rectal tone, gross red blood on rectal exam that is guaiac positive, no melena, no hemorrhoids appreciated, nontender rectal exam, no fecal impaction. Chaperone present. BACK: The back appears normal, + kyphosis EXT: Normal ROM in all joints; no deformity noted, no edema; no cyanosis SKIN: Normal color for age and race; warm; no rash on exposed skin NEURO: Moves all extremities equally PSYCH: The patient's mood and manner are appropriate.  ____________________________________________   LABS (all labs ordered are listed, but only abnormal results are displayed)  Labs Reviewed  CBC WITH DIFFERENTIAL/PLATELET -  Abnormal; Notable for the following components:      Result Value   RBC 3.37 (*)    Hemoglobin 9.4 (*)    HCT 29.8 (*)    RDW 18.0 (*)    Platelets 498 (*)    All other components within normal limits  COMPREHENSIVE METABOLIC PANEL - Abnormal; Notable for the following components:   Glucose, Bld 102 (*)    Albumin 3.2 (*)    All other components within normal limits  PROTIME-INR - Abnormal; Notable for the following components:   Prothrombin Time 24.0 (*)    INR 2.2 (*)    All other components within normal limits  RESP PANEL BY RT-PCR (FLU A&B, COVID) ARPGX2  LIPASE, BLOOD  URINALYSIS, ROUTINE W REFLEX MICROSCOPIC  SAMPLE TO BLOOD BANK  TYPE AND SCREEN   ____________________________________________  EKG   ____________________________________________  RADIOLOGY I, Sulo Janczak, personally viewed and evaluated these images (plain radiographs) as part of my medical decision making, as well as reviewing the written report by the radiologist.  ED MD interpretation: No recurrent volvulus.:  Mass visualized.  Official radiology report(s): CT ABDOMEN PELVIS W CONTRAST  Result Date:  03/15/2021 CLINICAL DATA:  Rectal bleeding that started on Friday. Recent volvulus surgery EXAM: CT ABDOMEN AND PELVIS WITH CONTRAST TECHNIQUE: Multidetector CT imaging of the abdomen and pelvis was performed using the standard protocol following bolus administration of intravenous contrast. CONTRAST:  2mL OMNIPAQUE IOHEXOL 300 MG/ML  SOLN COMPARISON:  Five days ago FINDINGS: Lower chest: Trace pleural effusions and bilateral lower lobe atelectasis. Right middle lobe is collapsed with a chronic appearance in the setting of marked scoliosis. Mitral and aortic annular calcification. Aortic valve calcification. Hepatobiliary: No focal liver abnormality.No evidence of biliary obstruction or stone. Pancreas: Unremarkable. Spleen: Unremarkable. Adrenals/Urinary Tract: Negative adrenals. No hydronephrosis or stone. Unremarkable bladder. Stomach/Bowel: No obstruction or recurrent volvulus appearance. Masslike thickening the ascending colon with indistinguishable medial mass and adjacent ileocolic vessels, possibly extra serosal extension. The mass measures at least 6 cm in length. Vascular/Lymphatic: Less than typical atheromatous changes for age. Accentuated ileocolic vessels around the described mass. Ileocolic lymph nodes measure up to 13 x 8 mm. Reproductive:Unremarkable for age Other: Small volume pneumoperitoneum, expected after recent surgery. Abdominal wall gas which is also expected. Fluid density collection in the left groin related to hernia, possibly thin oral. No internal bowel is seen traversing the neck. Musculoskeletal: Advanced spinal degeneration and levoscoliosis. IMPRESSION: 1. Bulky ascending colon mass compatible with carcinoma. Possible ileocolic adenopathy and extra serosal extension. 2. No recurrent volvulus. 3. Left groin hernia, potentially femoral. 4. Numerous chronic findings are described above. Electronically Signed   By: Monte Fantasia M.D.   On: 03/15/2021 06:57     ____________________________________________   PROCEDURES  Procedure(s) performed (including Critical Care):  Procedures    ____________________________________________   INITIAL IMPRESSION / ASSESSMENT AND PLAN / ED COURSE  As part of my medical decision making, I reviewed the following data within the [facility] notes reviewed and incorporated, Labs reviewed , Old chart reviewed and Notes from prior ED visits         Patient here with rectal bleeding while on Coumadin.  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  Had exploratory laparotomy with lysis of adhesions.  Having increasing abdominal pain.  Will obtain labs, CT of the abdomen pelvis, urine.  She does have gross red blood on rectal exam and reports dizziness but is currently  hemodynamically stable.  ED PROGRESS  Patient's CT scan concerning for colonic mass consistent with carcinoma.  No recurrent volvulus or other postoperative complication.  She continues to have small amount of rectal bleeding here without any gross bloody bowel movements.  She is coagulopathic with INR of 2.4 and anemic with hemoglobin of 9.4.  Have recommended admission.  7:20 AM Discussed patient's case with hospitalist, Dr. Francine Graven.  I have recommended admission and patient (and family if present) agree with this plan. Admitting physician will place admission orders.   We will give IV vitamin K.  Hospitalist requesting that I consult surgery so that they are aware patient is here given she is 5 days postop.  I reviewed all nursing notes, vitals, pertinent previous records and reviewed/interpreted all EKGs, lab and urine results, imaging (as available).  7:24 AM  Spoke with Dr. Peyton Najjar on call for surgery who will update Dr. Hampton Abbot. ____________________________________________   FINAL CLINICAL IMPRESSION(S) / ED DIAGNOSES  Final diagnoses:  Rectal bleeding     ED Discharge  Orders    None      *Please note:  Kristina Rowe was evaluated in Emergency Department on 03/15/2021 for the symptoms described in the history of present illness. She was evaluated in the context of the global COVID-19 pandemic, which necessitated consideration that the patient might be at risk for infection with the SARS-CoV-2 virus that causes COVID-19. Institutional protocols and algorithms that pertain to the evaluation of patients at risk for COVID-19 are in a state of rapid change based on information released by regulatory bodies including the CDC and federal and state organizations. These policies and algorithms were followed during the patient's care in the ED.  Some ED evaluations and interventions may be delayed as a result of limited staffing during and the pandemic.*   Note:  This document was prepared using Dragon voice recognition software and may include unintentional dictation errors.   Danijah Noh, Delice Bison, DO 03/15/21 [phone]

## 2021-03-15 NOTE — Progress Notes (Signed)
Patient alert and oriented,  oriented to room and call bell.  Assisted to chair per patient request.  Initiated cardiac and pulse ox monitoring.   Will continuje to montior.

## 2021-03-15 NOTE — ED Notes (Signed)
Report messaged to [facility].  Pt to floor  Pt alert. Family with pt

## 2021-03-15 NOTE — ED Triage Notes (Signed)
EMS brought in from home for rectal bleeding that started Friday. Pt had volvulus surgery this past Wednesday. Started having bright red bloody stools Friday, notified surgeon and told it was normal. Multiple episodes of diarrhea. Denies nausea/vomiting. Dizziness. Resumed her Warfarin Friday.

## 2021-03-15 NOTE — H&P (Signed)
History and Physical    Kristina Rowe GGY:[id number] DOB: [birth year] DOA: 03/15/2021  PCP: Haydee Salter, MD   Patient coming from: Home  I have personally briefly reviewed patient's old medical records in [place]  Chief Complaint: Rectal bleeding  HPI: Kristina Rowe is a 87 y.o. female with medical history significant for hypertension, history of pulmonary embolism on chronic anticoagulation therapy with Coumadin, hiatal hernia, status post recent exploratory laparotomy with lysis of adhesion for mesenteric volvulus who presents to the emergency room for evaluation of rectal bleeding. Patient was discharged home on 03/14/21. She states that prior to leaving the hospital that she had loose stools containing blood and was told this was expected following surgery. Since her discharge home she has had 3 episodes of stool containing bright red blood and states that she has fecal incontinence.  This concerned her and prompted her return to the emergency room. She has abdominal discomfort in the right lower quadrant but denies having any pain. She complains of feeling dizzy and lightheaded but has not had any falls. She denies having any chest pain, no shortness of breath, no cough, no blurred vision, no focal deficits, no fever, no chills, no cough, no palpitations, no urinary symptoms, no changes in her bowel habits. Labs show sodium 137, potassium 3.9, chloride 102, bicarb 28, glucose 102, BUN 9, creatinine 0.87, calcium 9.2, alkaline phosphatase 66, albumin 3.2, lipase 42, AST 26, ALT 14, total protein 6.6, total bilirubin 0.5, white count 5.6, hemoglobin 9.4, hematocrit 29.8, MCV 88.4, RDW 18, platelet count 498, PT 24, INR 2.2, Respiratory viral panel is negative CT scan of abdomen and pelvis shows a bulky ascending colon mass compatible with carcinoma.  Possible ileocolic adenopathy and extra serosal extension.  No recurrent volvulus.  Left groin hernia, potentially femoral.  ED  Course: Patient is an 87 year old female who presents to the emergency room for evaluation of rectal bleeding.  She is status post recent exploratory laparotomy and lysis of adhesion for mesenteric volvulus and was discharged home one day prior to her admission. She describes multiple episodes of bright red blood per rectum and is noted to have a mass in the ascending colon.  She will be referred to observation status and GI consult has been requested.  Review of Systems: As per HPI otherwise all other systems reviewed and negative.    Past Medical History:  Diagnosis Date  . Ductal carcinoma in situ (DCIS) of left breast   . Dysphagia   . GERD (gastroesophageal reflux disease)   . Hiatal hernia   . Hypertension   . Iron deficiency anemia   . Osteoporosis   . Pulmonary embolism ([place])   . Scoliosis   . UTI (urinary tract infection)     Past Surgical History:  Procedure Laterality Date  . BREAST LUMPECTOMY Left 2009   Ductal Carcinoma insitu   . CATARACT EXTRACTION, BILATERAL    . LAPAROSCOPIC PARAESOPHAGEAL HERNIA REPAIR  2009  . LAPAROTOMY N/A 03/10/2021   Procedure: EXPLORATORY LAPAROTOMY;  Surgeon: Olean Ree, MD;  Location: [facility] ORS;  Service: General;  Laterality: N/A;  . TUBAL LIGATION       reports that she has never smoked. She has never used smokeless tobacco. She reports that she does not drink alcohol and does not use drugs.  Allergies  Allergen Reactions  . Metronidazole Other (See Comments)    Other reaction(s): Other Mouth sores Mouth sores Mouth sores   . Omeprazole Other (See Comments)  Other reaction(s): Arthralgias (intolerance) Other reaction(s): Other   . Bactrim [Sulfamethoxazole-Trimethoprim] Nausea Only  . Codeine Nausea And Vomiting and Nausea Only    Other reaction(s): Nausea Only But tolerates tramadol     Family History  Problem Relation Age of Onset  . Breast cancer Neg Hx       Prior to Admission medications   Medication Sig  Start Date End Date Taking? Authorizing Provider  acetaminophen (TYLENOL) 500 MG tablet Take 500 mg by mouth at bedtime as needed for mild pain, fever or headache.   Yes [provider]  amitriptyline (ELAVIL) 10 MG tablet Take 10 mg by mouth at bedtime.   Yes [provider]  amLODipine (NORVASC) 2.5 MG tablet Take 2.5 mg by mouth daily. 02/24/21  Yes [provider]  calcium carbonate (OS-CAL) 1250 (500 Ca) MG chewable tablet Chew 1 tablet by mouth daily. 04/20/09  Yes [provider]  Cholecalciferol 50 MCG (2000 UT) CAPS Take 1 capsule by mouth daily.   Yes [provider]  cyanocobalamin 1000 MCG tablet Take 1,000 mcg by mouth daily.   Yes [provider]  diclofenac Sodium (VOLTAREN) 1 % GEL Apply topically 4 (four) times daily.   Yes [provider]  econazole nitrate 1 % cream Apply 1 application topically daily.   Yes [provider]  estradiol (ESTRACE) 0.1 MG/GM vaginal cream Estrogen Cream Instruction Discard applicator Apply pea sized amount to tip of finger to urethra before bed. Wash hands well after application. Use Monday, Wednesday and Friday 03/04/21  Yes Billey Co, MD  folic acid (FOLVITE) 1 MG tablet Take 1 mg by mouth daily. 01/17/19  Yes [provider]  loperamide (IMODIUM) 2 MG capsule Take 2 mg by mouth as needed for diarrhea or loose stools.   Yes [provider]  mirtazapine (REMERON) 30 MG tablet Take 30 mg by mouth at bedtime. 03/02/21  Yes [provider]  pantoprazole (PROTONIX) 40 MG tablet Take 40 mg by mouth daily.   Yes [provider]  warfarin (COUMADIN) 2.5 MG tablet On Sundays and Thursdays resume the 2.5 mg dose.  All other days take 5 mg. Patient taking differently: Take 2.5-5 mg by mouth daily at 12 noon. On Sundays and Thursdays resume the 2.5 mg dose.  All other days take 5 mg. 03/14/21  Yes Ronny Bacon, MD  promethazine (PHENERGAN) 25 MG tablet  Take 25 mg by mouth every 6 (six) hours as needed for nausea or vomiting.    [provider]  SUMAtriptan (IMITREX) 50 MG tablet Take 50 mg by mouth as directed. Take 1 at onset of headaTake 1 at onset of headache. May repeat once in two hours. Do not exceed 2 per day or 4 per week. 03/15/19   [provider]  tolterodine (DETROL LA) 4 MG 24 hr capsule Take 1 capsule (4 mg total) by mouth daily. Patient not taking: No sig reported 02/09/21   Billey Co, MD    Physical Exam: Vitals:   03/15/21 0450 03/15/21 0600 03/15/21 0736 03/15/21 0912  BP: 133/81 127/83 137/83 (!) 145/90  Pulse: 87 82 89 94  Resp: 18 18  17   Temp: 98.1 F (36.7 C)     TempSrc: Oral     SpO2: 96% 99% 100% 100%  Weight:      Height:         Vitals:   03/15/21 0450 03/15/21 0600 03/15/21 0736 03/15/21 0912  BP: 133/81 127/83  137/83 (!) 145/90  Pulse: 87 82 89 94  Resp: 18 18  17   Temp: 98.1 F (36.7 C)     TempSrc: Oral     SpO2: 96% 99% 100% 100%  Weight:      Height:          Constitutional: Alert and oriented x 3. Not in any apparent distress.  Thin and frail HEENT:      Head: Normocephalic and atraumatic.         Eyes: PERLA, EOMI, Conjunctivae pallor. Sclera is non-icteric.       Mouth/Throat: Mucous membranes are moist.       Neck: Supple with no signs of meningismus. Cardiovascular: Regular rate and rhythm. No murmurs, gallops, or rubs. 2+ symmetrical distal pulses are present . No JVD. No LE edema Respiratory: Respiratory effort normal .Lungs sounds clear bilaterally. No wheezes, crackles, or rhonchi.  Gastrointestinal: Soft, non tender, and non distended with positive bowel sounds.  Infraumbilical incision with staples in place Genitourinary: No CVA tenderness. Musculoskeletal: Nontender with normal range of motion in all extremities. No cyanosis, or erythema of extremities. Neurologic:  Face is symmetric. Moving all extremities. No gross focal neurologic deficits  . Skin: Skin is warm, dry.  No rash or ulcers Psychiatric: Mood and affect are normal.   Labs on Admission: I have personally reviewed following labs and imaging studies  CBC: Recent Labs  Lab 03/09/21 2306 03/11/21 0636 03/13/21 1036 03/14/21 0459 03/15/21 0514  WBC 6.6 9.5 7.4 6.3 5.6  NEUTROABS 5.3  --   --   --  3.8  HGB 9.2* 8.0* 8.9* 7.9* 9.4*  HCT 29.8* 25.5* 28.4* 24.6* 29.8*  MCV 87.9 88.2 88.8 86.9 88.4  PLT 436* 379 462* 391 073*   Basic Metabolic Panel: Recent Labs  Lab 03/09/21 2306 03/11/21 0636 03/15/21 0514  NA 134* 136 137  K 4.4 4.2 3.9  CL 99 103 102  CO2 29 24 28   GLUCOSE 111* 83 102*  BUN 22 22 9   CREATININE 0.89 0.97 0.87  CALCIUM 9.0 8.5* 9.2  MG  --  1.7  --    GFR: Estimated Creatinine Clearance: 28.8 mL/min (by C-G formula based on SCr of 0.87 mg/dL). Liver Function Tests: Recent Labs  Lab 03/09/21 2306 03/15/21 0514  AST 25 26  ALT 14 14  ALKPHOS 84 66  BILITOT 0.5 0.5  PROT 6.5 6.6  ALBUMIN 3.4* 3.2*   Recent Labs  Lab 03/09/21 2306 03/15/21 0514  LIPASE 24 42   No results for input(s): AMMONIA in the last 168 hours. Coagulation Profile: Recent Labs  Lab 03/11/21 0520 03/12/21 0519 03/13/21 1036 03/14/21 0459 03/15/21 0514  INR 1.3* 1.3* 1.5* 2.2* 2.2*   Cardiac Enzymes: No results for input(s): CKTOTAL, CKMB, CKMBINDEX, TROPONINI in the last 168 hours. BNP (last 3 results) No results for input(s): PROBNP in the last 8760 hours. HbA1C: No results for input(s): HGBA1C in the last 72 hours. CBG: No results for input(s): GLUCAP in the last 168 hours. Lipid Profile: No results for input(s): CHOL, HDL, LDLCALC, TRIG, CHOLHDL, LDLDIRECT in the last 72 hours. Thyroid Function Tests: No results for input(s): TSH, T4TOTAL, FREET4, T3FREE, THYROIDAB in the last 72 hours. Anemia Panel: No results for input(s): VITAMINB12, FOLATE, FERRITIN, TIBC, IRON, RETICCTPCT in the last 72 hours. Urine analysis:    Component  Value Date/Time   COLORURINE YELLOW (A) 03/10/2021 0210   APPEARANCEUR CLOUDY (A) 03/10/2021 0210   APPEARANCEUR Clear 02/16/2021 1016  LABSPEC 1.029 03/10/2021 0210   PHURINE 5.0 03/10/2021 0210   GLUCOSEU NEGATIVE 03/10/2021 0210   HGBUR SMALL (A) 03/10/2021 0210   BILIRUBINUR NEGATIVE 03/10/2021 0210   BILIRUBINUR Negative 02/16/2021 1016   KETONESUR NEGATIVE 03/10/2021 0210   PROTEINUR NEGATIVE 03/10/2021 0210   NITRITE POSITIVE (A) 03/10/2021 0210   LEUKOCYTESUR LARGE (A) 03/10/2021 0210    Radiological Exams on Admission: CT ABDOMEN PELVIS W CONTRAST  Result Date: 03/15/2021 CLINICAL DATA:  Rectal bleeding that started on Friday. Recent volvulus surgery EXAM: CT ABDOMEN AND PELVIS WITH CONTRAST TECHNIQUE: Multidetector CT imaging of the abdomen and pelvis was performed using the standard protocol following bolus administration of intravenous contrast. CONTRAST:  81mL OMNIPAQUE IOHEXOL 300 MG/ML  SOLN COMPARISON:  Five days ago FINDINGS: Lower chest: Trace pleural effusions and bilateral lower lobe atelectasis. Right middle lobe is collapsed with a chronic appearance in the setting of marked scoliosis. Mitral and aortic annular calcification. Aortic valve calcification. Hepatobiliary: No focal liver abnormality.No evidence of biliary obstruction or stone. Pancreas: Unremarkable. Spleen: Unremarkable. Adrenals/Urinary Tract: Negative adrenals. No hydronephrosis or stone. Unremarkable bladder. Stomach/Bowel: No obstruction or recurrent volvulus appearance. Masslike thickening the ascending colon with indistinguishable medial mass and adjacent ileocolic vessels, possibly extra serosal extension. The mass measures at least 6 cm in length. Vascular/Lymphatic: Less than typical atheromatous changes for age. Accentuated ileocolic vessels around the described mass. Ileocolic lymph nodes measure up to 13 x 8 mm. Reproductive:Unremarkable for age Other: Small volume pneumoperitoneum, expected after  recent surgery. Abdominal wall gas which is also expected. Fluid density collection in the left groin related to hernia, possibly thin oral. No internal bowel is seen traversing the neck. Musculoskeletal: Advanced spinal degeneration and levoscoliosis. IMPRESSION: 1. Bulky ascending colon mass compatible with carcinoma. Possible ileocolic adenopathy and extra serosal extension. 2. No recurrent volvulus. 3. Left groin hernia, potentially femoral. 4. Numerous chronic findings are described above. Electronically Signed   By: Monte Fantasia M.D.   On: 03/15/2021 06:57     Assessment/Plan Principal Problem:   Rectal bleeding Active Problems:   Anemia, unspecified   Hypertension   GERD (gastroesophageal reflux disease)   Colonic mass   Pulmonary embolism ([facility])    Rectal bleeding  Patient presents to the ER for evaluation of several episodes of rectal bleeding consisting of bright red blood. She is status post recent exploratory laparotomy with lysis of adhesion for mesenteric volvulus CT scan of abdomen and pelvis done today shows a bulky mass in the ascending colon compatible with carcinoma which may explain patient's rectal bleeding.  We will consult GI for possible colonoscopy Check serial H&H Patient is on Coumadin which will be placed on hold for now    Hypertension Hold amlodipine for now since patient is normotensive    History of pulmonary embolism on chronic anticoagulation therapy Hold Coumadin for now due to rectal bleeding    Hiatal hernia with GERD Continue Protonix   DVT prophylaxis: SCD Code Status: DO NOT RESUSCITATE Family Communication: Greater than 50% of time was spent discussing patient's condition and plan of care with her and her daughter who was at the bedside.  She lists her daughter Curt Bears as her healthcare power of attorney.  CODE STATUS was discussed and she is a DO NOT RESUSCITATE Disposition Plan: Back to previous home environment Consults  called: Gastroenterology Status: Observation    Tamyia Minich MD [facility]     03/15/2021, 9:49 AM

## 2021-03-15 NOTE — Consult Note (Addendum)
Patient seen and evaluated with Mr. Kristina Rowe and agree with his note and made changes were appropriate.  Patient is s/p exlap with LOA for small bowel volvulus last week.  She presents today with small episodes of blood mixed with her stool.  She had a CT scan in the ED which I have personally viewed, showing a potential mass in the ascending colon measuring 6 cm.  Comparing to the previous CT from last week, the two areas look similar, but there was no mention of a mass on radiology read.  There is no evidence of obstruction and in fact she is less distended on today's CT compared to last week.  Her Hgb is 9.4, WBC normal at 5.6, Cr normal at 0.87, albumin mildly lower at 3.2, and INR at therapeutic level of 2.2.  She has a history of atrial fibrillation and hypercoagulable disease and is on chronic coumadin.  In the ED, she has received vitamin K.  Her vitals are normal and she's not in any distress.  Discussed with her that the first step in this setting is to obtain a colonoscopy to evaluate for this potential mass and evaluate the entire colon.  Discussed with her the potential need for surgery in there is a mass found, and she's in agreement after we discussed further what the surgery would be like, recovery, timing, and such.  She is not declining surgical intervention at this point, and is agreeable to a right colectomy if needed.    For now, continue holding Coumadin, await colonoscopy results, and will discuss further plans afterwards.  Defer decision on diet for today until after GI sees her.  If no plans for colonoscopy tomorrow, ok with diet from surgical standpoint today.   Kristina Ree, MD    [facility] SURGICAL CONSULTATION NOTE (initial) - cpt: [phone]   HISTORY OF PRESENT ILLNESS (HPI):  87 y.o. female presented to [facility] ED today for evaluation of rectal bleeding. Patient is well known to our service following recent admission for small bowel volvulus requiring  exploratory laparotomy with Dr Hampton Abbot on 04/13. She ultimately did well and was discharged on Sunday (04/17). Unfortunately, she reports that early this morning she had 2-3 episode of blood stools. She described this as a mixed of dark and bright red blood in her stool. She did have an episode of blood in her stool on Friday but this was an isolated event at the time. She notes some RLQ soreness but otherwise denied any fever, chills, nausea, emesis. She has been tolerating PO at home but in smaller portions. No issues with her incision. Work up in the ED revealed improvement in her Hgb to 9.4 (from 7.9 at discharge), normal WBC to 5.6K, renal function is normal with sCr - 0.87, no electrolyte derangements, and INR is 2.2. She did have CT Abdomen/Pelvis which was concerning for large ascending colon mass without obstruction.   Surgery is consulted by emergency medicine physician Dr. Pryor Curia, DO in this context for evaluation and management of ascending colon mass.   PAST MEDICAL HISTORY (PMH):  Past Medical History:  Diagnosis Date  . Ductal carcinoma in situ (DCIS) of left breast   . Dysphagia   . GERD (gastroesophageal reflux disease)   . Hiatal hernia   . Hypertension   . Iron deficiency anemia   . Osteoporosis   . Pulmonary embolism ([place])   . Scoliosis   . UTI (urinary tract infection)      PAST SURGICAL HISTORY (  [place]):  Past Surgical History:  Procedure Laterality Date  . BREAST LUMPECTOMY Left 2009   Ductal Carcinoma insitu   . CATARACT EXTRACTION, BILATERAL    . LAPAROSCOPIC PARAESOPHAGEAL HERNIA REPAIR  2009  . LAPAROTOMY N/A 03/10/2021   Procedure: EXPLORATORY LAPAROTOMY;  Surgeon: Kristina Ree, MD;  Location: [facility] ORS;  Service: General;  Laterality: N/A;  . TUBAL LIGATION       MEDICATIONS:  Prior to Admission medications   Medication Sig Start Date End Date Taking? Authorizing Provider  acetaminophen (TYLENOL) 500 MG tablet Take 500 mg by mouth at bedtime as needed  for mild pain, fever or headache.   Yes [provider]  amitriptyline (ELAVIL) 10 MG tablet Take 10 mg by mouth at bedtime.   Yes [provider]  amLODipine (NORVASC) 2.5 MG tablet Take 2.5 mg by mouth daily. 02/24/21  Yes [provider]  calcium carbonate (OS-CAL) 1250 (500 Ca) MG chewable tablet Chew 1 tablet by mouth daily. 04/20/09  Yes [provider]  Cholecalciferol 50 MCG (2000 UT) CAPS Take 1 capsule by mouth daily.   Yes [provider]  cyanocobalamin 1000 MCG tablet Take 1,000 mcg by mouth daily.   Yes [provider]  diclofenac Sodium (VOLTAREN) 1 % GEL Apply topically 4 (four) times daily.   Yes [provider]  econazole nitrate 1 % cream Apply 1 application topically daily.   Yes [provider]  estradiol (ESTRACE) 0.1 MG/GM vaginal cream Estrogen Cream Instruction Discard applicator Apply pea sized amount to tip of finger to urethra before bed. Wash hands well after application. Use Monday, Wednesday and Friday 03/04/21  Yes Billey Co, MD  folic acid (FOLVITE) 1 MG tablet Take 1 mg by mouth daily. 01/17/19  Yes [provider]  loperamide (IMODIUM) 2 MG capsule Take 2 mg by mouth as needed for diarrhea or loose stools.   Yes [provider]  mirtazapine (REMERON) 30 MG tablet Take 30 mg by mouth at bedtime. 03/02/21  Yes [provider]  pantoprazole (PROTONIX) 40 MG tablet Take 40 mg by mouth daily.   Yes [provider]  warfarin (COUMADIN) 2.5 MG tablet On Sundays and Thursdays resume the 2.5 mg dose.  All other days take 5 mg. Patient taking differently: Take 2.5-5 mg by mouth daily at 12 noon. On Sundays and Thursdays resume the 2.5 mg dose.  All other days take 5 mg. 03/14/21  Yes Ronny Bacon, MD  promethazine (PHENERGAN) 25 MG tablet Take 25 mg by mouth every 6 (six) hours as needed for nausea or vomiting.    [provider]  SUMAtriptan (IMITREX) 50 MG  tablet Take 50 mg by mouth as directed. Take 1 at onset of headaTake 1 at onset of headache. May repeat once in two hours. Do not exceed 2 per day or 4 per week. 03/15/19   [provider]  tolterodine (DETROL LA) 4 MG 24 hr capsule Take 1 capsule (4 mg total) by mouth daily. Patient not taking: No sig reported 02/09/21   Billey Co, MD     ALLERGIES:  Allergies  Allergen Reactions  . Metronidazole Other (See Comments)    Other reaction(s): Other Mouth sores Mouth sores Mouth sores   . Omeprazole Other (See Comments)    Other reaction(s): Arthralgias (intolerance) Other reaction(s): Other   . Bactrim [Sulfamethoxazole-Trimethoprim] Nausea Only  . Codeine Nausea And Vomiting and Nausea Only    Other reaction(s): Nausea Only But tolerates tramadol  SOCIAL HISTORY:  Social History   Socioeconomic History  . Marital status: Married    Spouse name: Not on file  . Number of children: Not on file  . Years of education: Not on file  . Highest education level: Not on file  Occupational History  . Not on file  Tobacco Use  . Smoking status: Never Smoker  . Smokeless tobacco: Never Used  Substance and Sexual Activity  . Alcohol use: Never  . Drug use: Never  . Sexual activity: Not Currently  Other Topics Concern  . Not on file  Social History Narrative  . Not on file   Social Determinants of Health   Financial Resource Strain: Not on file  Food Insecurity: Not on file  Transportation Needs: Not on file  Physical Activity: Not on file  Stress: Not on file  Social Connections: Not on file  Intimate Partner Violence: Not on file     FAMILY HISTORY:  Family History  Problem Relation Age of Onset  . Breast cancer Neg Hx       REVIEW OF SYSTEMS:  Review of Systems  Constitutional: Negative for chills and fever.  HENT: Negative for congestion and sore throat.   Respiratory: Negative for cough and shortness of breath.   Cardiovascular: Negative for  chest pain and palpitations.  Gastrointestinal: Positive for abdominal pain, blood in stool and diarrhea. Negative for constipation, nausea and vomiting.  Genitourinary: Negative for dysuria and urgency.  All other systems reviewed and are negative.   VITAL SIGNS:  Temp:  [98.1 F (36.7 C)] 98.1 F (36.7 C) (04/18 0450) Pulse Rate:  [82-94] 94 (04/18 0912) Resp:  [17-20] 17 (04/18 0912) BP: (109-145)/(72-90) 145/90 (04/18 0912) SpO2:  [96 %-100 %] 100 % (04/18 0912) Weight:  [40.8 kg] 40.8 kg (04/18 0449)     Height: 5\' 1"  (154.9 cm) Weight: 40.8 kg BMI (Calculated): 17   INTAKE/OUTPUT:  No intake/output data recorded.  PHYSICAL EXAM:  Physical Exam Vitals and nursing note reviewed. Exam conducted with a chaperone present.  Constitutional:      General: She is not in acute distress.    Appearance: Normal appearance. She is normal weight. She is not ill-appearing.  HENT:     Head: Normocephalic and atraumatic.     Mouth/Throat:     Mouth: Mucous membranes are moist.  Eyes:     General: No scleral icterus.    Conjunctiva/sclera: Conjunctivae normal.  Cardiovascular:     Rate and Rhythm: Normal rate.     Pulses: Normal pulses.     Heart sounds: No murmur heard.   Pulmonary:     Effort: Pulmonary effort is normal. No respiratory distress.  Abdominal:     General: A surgical scar is present. There is no distension.     Palpations: Abdomen is soft.     Tenderness: There is abdominal tenderness in the right lower quadrant. There is no guarding or rebound.     Comments: Abdomen is soft, she does have mild RLQ tenderness, non-distended, no rebound.guarding. Laparotomy incision is CDI with staples, no erythema or drainage  Genitourinary:    Comments: Deferred Musculoskeletal:     Right lower leg: No edema.     Left lower leg: No edema.  Skin:    General: Skin is warm and dry.     Coloration: Skin is not pale.     Findings: No erythema.  Neurological:     General: No focal  deficit present.  Mental Status: She is alert and oriented to person, place, and time.  Psychiatric:        Mood and Affect: Mood normal.        Behavior: Behavior normal.      Labs:  CBC Latest Ref Rng & Units 03/15/2021 03/14/2021 03/13/2021  WBC 4.0 - 10.5 K/uL 5.6 6.3 7.4  Hemoglobin 12.0 - 15.0 g/dL 9.4(L) 7.9(L) 8.9(L)  Hematocrit 36.0 - 46.0 % 29.8(L) 24.6(L) 28.4(L)  Platelets 150 - 400 K/uL 498(H) 391 462(H)   CMP Latest Ref Rng & Units 03/15/2021 03/11/2021 03/09/2021  Glucose 70 - 99 mg/dL 102(H) 83 111(H)  BUN 8 - 23 mg/dL 9 22 22   Creatinine 0.44 - 1.00 mg/dL 0.87 0.97 0.89  Sodium 135 - 145 mmol/L 137 136 134(L)  Potassium 3.5 - 5.1 mmol/L 3.9 4.2 4.4  Chloride 98 - 111 mmol/L 102 103 99  CO2 22 - 32 mmol/L 28 24 29   Calcium 8.9 - 10.3 mg/dL 9.2 8.5(L) 9.0  Total Protein 6.5 - 8.1 g/dL 6.6 - 6.5  Total Bilirubin 0.3 - 1.2 mg/dL 0.5 - 0.5  Alkaline Phos 38 - 126 U/L 66 - 84  AST 15 - 41 U/L 26 - 25  ALT 0 - 44 U/L 14 - 14     Imaging studies:   CT Abdomen/Pelvis (03/15/2021) personally reviewed which is concerning for changes to the ascending colon concerning for potential malignant mass, which appears similar to previous CT on 04/13 but no mention made on that report, and radiologist report reviewed:  IMPRESSION: 1. Bulky ascending colon mass compatible with carcinoma. Possible ileocolic adenopathy and extra serosal extension. 2. No recurrent volvulus. 3. Left groin hernia, potentially femoral. 4. Numerous chronic findings are described above.   Assessment/Plan: (ICD-10's: K87.5) 87 y.o. female with new onset of blood in her stool found to have ascending colon mass concerning for malignancy now 5 days s/p exploratory laparotomy and lysis of adhesionsfor mesenteric volvulus.   - I had a long discussion with her regarding her CT finding and hematochezia potentially representing malignant colon mass. She seems very understanding of this. I discussed with her  multiple potential management pathways both inpatient and outpatient. She notes that "she does not want anything heroic done" should this represent cancer and she would "defer any surgery and chemotherapy." I did review potential for colonoscopy this admission for definitive diagnosis which she is agreeable with, and would recommend GI consultation at a minimum. Continue to monitor H&H; transfuse if needed. No emergent surgical intervention as she is without obstruction, massive blood loss, nor peritonitis. Further management; we will of course follow along.    All of the above findings and recommendations were discussed with the patient, and all of patient's questions were answered to her expressed satisfaction.  Thank you for the opportunity to participate in this patient's care.   -- Edison Simon, PA-C [facility] 03/15/2021, 9:20 AM [phone] M-F: 7am - 4pm

## 2021-03-15 NOTE — Consult Note (Signed)
Consultation  Referring Provider:     Dr Francine Graven Admit date 03/15/21 Consult date        03/15/21 Reason for Consultation:     Rectal bleeding         HPI:   Kristina Rowe is a 87 y.o. female  with medical history significant for hypertension, history of pulmonary embolism on chronic anticoagulation therapy with Coumadin, hiatal hernia, status post recent exploratory laparotomy with lysis of adhesion for mesenteric volvulus who presents to the emergency room for evaluation of rectal bleeding, subsequently admitted for same. Noted also some rlq pain and had abnormal CT, demonstrating a bulky ascending colon mass suspected to be malignant but no recurrent volvulus hgb 9,4- improved from 2d ago- there is some mild thrombocytosis; liver enzytmes normal. INR is therapeutic at 2.2. lipase normal. Has been evaluated by Dr Hampton Abbot- appreciate note Patient reports she was doing ok at home- has noted some intermittent red rectal bleeding for the last few days and has been having loose stools, noted some rlq discomfort today since arriving to hospital. Feels she is recovering otherwise well from her recent surgery. Denies any fever, nv, other gi concerns, says incision is healing well. States she has had multiple antibiotics for uti and other issues over the last few months- last had bactrim 2w ago. States a history of achalasia, egd's every 19m at [facility] for dilation/botox injections- last 3/22 and states good effect. Had evaluation for unintentional weight loss last year and IDA- saw nutritionist, started on Fe, nutritional supplements, and remeron- had Ct a/p/chest wihtout any significant findings in the GI tract, then moved to this area to be with family soon after Denies any problems with sedated procedures. States mother had colon cancer diagnoed at age 63-86, died at 75.  Had a dose of vitamin K 5mg  po today  PREVIOUS ENDOSCOPIES:            Colonoscopy 2013- [facility]- I am unable to access this document.  Patient states negative findings. EGD 02/11/21- Dr Theodoro Grist, as above. I cannot access full report.   Past Medical History:  Diagnosis Date  . Ductal carcinoma in situ (DCIS) of left breast   . Dysphagia   . GERD (gastroesophageal reflux disease)   . Hiatal hernia   . Hypertension   . Iron deficiency anemia   . Osteoporosis   . Pulmonary embolism ([place])   . Scoliosis   . UTI (urinary tract infection)     Past Surgical History:  Procedure Laterality Date  . BREAST LUMPECTOMY Left 2009   Ductal Carcinoma insitu   . CATARACT EXTRACTION, BILATERAL    . LAPAROSCOPIC PARAESOPHAGEAL HERNIA REPAIR  2009  . LAPAROTOMY N/A 03/10/2021   Procedure: EXPLORATORY LAPAROTOMY;  Surgeon: Olean Ree, MD;  Location: [facility] ORS;  Service: General;  Laterality: N/A;  . TUBAL LIGATION      Family History  Problem Relation Age of Onset  . Breast cancer Neg Hx   mother -n colon cancer  Social History   Tobacco Use  . Smoking status: Never Smoker  . Smokeless tobacco: Never Used  Substance Use Topics  . Alcohol use: Never  . Drug use: Never    Prior to Admission medications   Medication Sig Start Date End Date Taking? Authorizing Provider  acetaminophen (TYLENOL) 500 MG tablet Take 500 mg by mouth at bedtime as needed for mild pain, fever or headache.   Yes [provider]  amitriptyline (ELAVIL) 10 MG tablet Take 10 mg by  mouth at bedtime.   Yes [provider]  amLODipine (NORVASC) 2.5 MG tablet Take 2.5 mg by mouth daily. 02/24/21  Yes [provider]  calcium carbonate (OS-CAL) 1250 (500 Ca) MG chewable tablet Chew 1 tablet by mouth daily. 04/20/09  Yes [provider]  Cholecalciferol 50 MCG (2000 UT) CAPS Take 1 capsule by mouth daily.   Yes [provider]  cyanocobalamin 1000 MCG tablet Take 1,000 mcg by mouth daily.   Yes [provider]  diclofenac Sodium (VOLTAREN) 1 % GEL Apply topically 4 (four) times daily.   Yes  [provider]  econazole nitrate 1 % cream Apply 1 application topically daily.   Yes [provider]  estradiol (ESTRACE) 0.1 MG/GM vaginal cream Estrogen Cream Instruction Discard applicator Apply pea sized amount to tip of finger to urethra before bed. Wash hands well after application. Use Monday, Wednesday and Friday 03/04/21  Yes Billey Co, MD  folic acid (FOLVITE) 1 MG tablet Take 1 mg by mouth daily. 01/17/19  Yes [provider]  loperamide (IMODIUM) 2 MG capsule Take 2 mg by mouth as needed for diarrhea or loose stools.   Yes [provider]  mirtazapine (REMERON) 30 MG tablet Take 30 mg by mouth at bedtime. 03/02/21  Yes [provider]  pantoprazole (PROTONIX) 40 MG tablet Take 40 mg by mouth daily.   Yes [provider]  warfarin (COUMADIN) 2.5 MG tablet On Sundays and Thursdays resume the 2.5 mg dose.  All other days take 5 mg. Patient taking differently: Take 2.5-5 mg by mouth daily at 12 noon. On Sundays and Thursdays resume the 2.5 mg dose.  All other days take 5 mg. 03/14/21  Yes Ronny Bacon, MD  promethazine (PHENERGAN) 25 MG tablet Take 25 mg by mouth every 6 (six) hours as needed for nausea or vomiting.    [provider]  SUMAtriptan (IMITREX) 50 MG tablet Take 50 mg by mouth as directed. Take 1 at onset of headaTake 1 at onset of headache. May repeat once in two hours. Do not exceed 2 per day or 4 per week. 03/15/19   [provider]  tolterodine (DETROL LA) 4 MG 24 hr capsule Take 1 capsule (4 mg total) by mouth daily. Patient not taking: No sig reported 02/09/21   Billey Co, MD    Current Facility-Administered Medications  Medication Dose Route Frequency Provider Last Rate Last Admin  . 0.9 %  sodium chloride infusion  250 mL Intravenous PRN Agbata, Tochukwu, MD      . acetaminophen (TYLENOL) tablet 500 mg  500 mg Oral QHS PRN Agbata, Tochukwu, MD      . amitriptyline (ELAVIL) tablet 10 mg   10 mg Oral QHS Agbata, Tochukwu, MD      . calcium carbonate (OS-CAL - dosed in mg of elemental calcium) tablet 1,250 mg  1,250 mg Oral Q breakfast Agbata, Tochukwu, MD      . cholecalciferol (VITAMIN D) tablet 1,000 Units  1,000 Units Oral Daily Agbata, Tochukwu, MD      . estradiol (ESTRACE) vaginal cream 1 Applicatorful  1 Applicatorful Vaginal Once per day on Mon Wed Fri Agbata, Tochukwu, MD      . folic acid (FOLVITE) tablet 1 mg  1 mg Oral Daily Agbata, Tochukwu, MD      . mirtazapine (REMERON) tablet 30 mg  30 mg Oral QHS Agbata, Tochukwu, MD      . ondansetron (ZOFRAN) tablet 4 mg  4 mg Oral Q6H PRN Agbata, Tochukwu, MD       Or  . ondansetron (ZOFRAN) injection 4 mg  4 mg Intravenous Q6H PRN Agbata, Tochukwu, MD      . pantoprazole (PROTONIX) EC tablet 40 mg  40 mg Oral Daily Agbata, Tochukwu, MD      . sodium chloride flush (NS) 0.9 % injection 3 mL  3 mL Intravenous Q12H Agbata, Tochukwu, MD   3 mL at 03/15/21 1003  . sodium chloride flush (NS) 0.9 % injection 3 mL  3 mL Intravenous PRN Agbata, Tochukwu, MD      . SUMAtriptan (IMITREX) tablet 50 mg  50 mg Oral UD Agbata, Tochukwu, MD      . vitamin B-12 (CYANOCOBALAMIN) tablet 1,000 mcg  1,000 mcg Oral Daily Agbata, Tochukwu, MD       Current Outpatient Medications  Medication Sig Dispense Refill  . acetaminophen (TYLENOL) 500 MG tablet Take 500 mg by mouth at bedtime as needed for mild pain, fever or headache.    Marland Kitchen amitriptyline (ELAVIL) 10 MG tablet Take 10 mg by mouth at bedtime.    Marland Kitchen amLODipine (NORVASC) 2.5 MG tablet Take 2.5 mg by mouth daily.    . calcium carbonate (OS-CAL) 1250 (500 Ca) MG chewable tablet Chew 1 tablet by mouth daily.    . Cholecalciferol 50 MCG (2000 UT) CAPS Take 1 capsule by mouth daily.    . cyanocobalamin 1000 MCG tablet Take 1,000 mcg by mouth daily.    . diclofenac Sodium (VOLTAREN) 1 % GEL Apply topically 4 (four) times daily.    Marland Kitchen econazole nitrate 1 % cream Apply 1 application topically daily.     Marland Kitchen estradiol (ESTRACE) 0.1 MG/GM vaginal cream Estrogen Cream Instruction Discard applicator Apply pea sized amount to tip of finger to urethra before bed. Wash hands well after application. Use Monday, Wednesday and Friday 95.2 g 3  . folic acid (FOLVITE) 1 MG tablet Take 1 mg by mouth daily.    Marland Kitchen loperamide (IMODIUM) 2 MG capsule Take 2 mg by mouth as needed for diarrhea or loose stools.    . mirtazapine (REMERON) 30 MG tablet Take 30 mg by mouth at bedtime.    . pantoprazole (PROTONIX) 40 MG tablet Take 40 mg by mouth daily.    Marland Kitchen warfarin (COUMADIN) 2.5 MG tablet On Sundays and Thursdays resume the 2.5 mg dose.  All other days take 5 mg. (Patient taking differently: Take 2.5-5 mg by mouth daily at 12 noon. On Sundays and Thursdays resume the 2.5 mg dose.  All other days take 5 mg.) 30 tablet 11  . promethazine (PHENERGAN) 25 MG tablet Take 25 mg by mouth every 6 (six) hours as needed for nausea or vomiting.    . SUMAtriptan (IMITREX) 50 MG tablet Take 50 mg by mouth as directed. Take 1 at onset of headaTake 1 at onset of headache. May repeat once in two hours. Do not exceed 2 per day or 4 per week.    . tolterodine (DETROL LA) 4 MG 24 hr capsule Take 1 capsule (4 mg total) by mouth daily. (Patient not taking: No sig reported) 30 capsule 11    Allergies as of 03/15/2021 - Review Complete 03/15/2021  Allergen Reaction Noted  . Metronidazole Other (See Comments) 02/28/2014  . Omeprazole Other (See Comments) 05/26/2014  . Bactrim [sulfamethoxazole-trimethoprim] Nausea Only 02/16/2021  . Codeine Nausea And Vomiting and Nausea Only 07/23/1997     Review of Systems:    All  systems reviewed and negative except where noted in HPI with the exception of history of heart murmur/AS.     Physical Exam:  Vital signs in last 24 hours: Temp:  [98.1 F (36.7 C)] 98.1 F (36.7 C) (04/18 0450) Pulse Rate:  [82-94] 90 (04/18 1029) Resp:  [17-18] 17 (04/18 1029) BP: (127-145)/(74-90) 144/74 (04/18  1029) SpO2:  [96 %-100 %] 96 % (04/18 1029) Weight:  [40.8 kg] 40.8 kg (04/18 0449)   General:   Pleasant elderly woman in NAD Head:  Normocephalic and atraumatic. Eyes:   No icterus.   Conjunctiva pink. Ears:  Normal auditory acuity. Mouth: Mucosa pink moist, no lesions. Neck:  Supple; no masses felt Lungs:  Respirations even and unlabored. Lungs clear to auscultation bilaterally.   No wheezes, crackles, or rhonchi.  Heart:  S1S2, RRR, no MRG. No edema. Abdomen:   Flat, soft, nondistended, rlq tenderness. Incisions with steri strips intact & appear benign. Normal bowel sounds. No appreciable masses or hepatomegaly. No rebound signs or other peritoneal signs. Rectal:  Not performed.  Msk:  MAEW x4, No clubbing or cyanosis. Strength 5/5. Symmetrical without gross deformities. Neurologic:  Alert and  oriented x4;  Cranial nerves II-XII intact.  Skin:  Warm, dry, pink without significant lesions or rashes. Psych:  Alert and cooperative. Normal affect.  LAB RESULTS: Recent Labs    03/13/21 1036 03/14/21 0459 03/15/21 0514  WBC 7.4 6.3 5.6  HGB 8.9* 7.9* 9.4*  HCT 28.4* 24.6* 29.8*  PLT 462* 391 498*   BMET Recent Labs    03/15/21 0514  NA 137  K 3.9  CL 102  CO2 28  GLUCOSE 102*  BUN 9  CREATININE 0.87  CALCIUM 9.2   LFT Recent Labs    03/15/21 0514  PROT 6.6  ALBUMIN 3.2*  AST 26  ALT 14  ALKPHOS 66  BILITOT 0.5   PT/INR Recent Labs    03/14/21 0459 03/15/21 0514  LABPROT 24.2* 24.0*  INR 2.2* 2.2*    STUDIES: CT ABDOMEN PELVIS W CONTRAST  Result Date: 03/15/2021 CLINICAL DATA:  Rectal bleeding that started on Friday. Recent volvulus surgery EXAM: CT ABDOMEN AND PELVIS WITH CONTRAST TECHNIQUE: Multidetector CT imaging of the abdomen and pelvis was performed using the standard protocol following bolus administration of intravenous contrast. CONTRAST:  62mL OMNIPAQUE IOHEXOL 300 MG/ML  SOLN COMPARISON:  Five days ago FINDINGS: Lower chest: Trace pleural  effusions and bilateral lower lobe atelectasis. Right middle lobe is collapsed with a chronic appearance in the setting of marked scoliosis. Mitral and aortic annular calcification. Aortic valve calcification. Hepatobiliary: No focal liver abnormality.No evidence of biliary obstruction or stone. Pancreas: Unremarkable. Spleen: Unremarkable. Adrenals/Urinary Tract: Negative adrenals. No hydronephrosis or stone. Unremarkable bladder. Stomach/Bowel: No obstruction or recurrent volvulus appearance. Masslike thickening the ascending colon with indistinguishable medial mass and adjacent ileocolic vessels, possibly extra serosal extension. The mass measures at least 6 cm in length. Vascular/Lymphatic: Less than typical atheromatous changes for age. Accentuated ileocolic vessels around the described mass. Ileocolic lymph nodes measure up to 13 x 8 mm. Reproductive:Unremarkable for age Other: Small volume pneumoperitoneum, expected after recent surgery. Abdominal wall gas which is also expected. Fluid density collection in the left groin related to hernia, possibly thin oral. No internal bowel is seen traversing the neck. Musculoskeletal: Advanced spinal degeneration and levoscoliosis. IMPRESSION: 1. Bulky ascending colon mass compatible with carcinoma. Possible ileocolic adenopathy and extra serosal extension. 2. No recurrent volvulus. 3. Left groin hernia, potentially femoral. 4. Numerous  chronic findings are described above. Electronically Signed   By: Monte Fantasia M.D.   On: 03/15/2021 06:57       Impression / Plan:     1.  Rlq pain/abnormal CT colon/rectal bleeding/diarrhea- Discussed with patient. Do think given her multiple antibiotics that testing for cdiff is reasonable.  We discussed colonoscopy and she says she would like to have this done. Daughter present in room. We discussed the indications/risks/benefits- plan for Wednesday- can have regular diet tonight and then clears/bowel prep tomorrow.    Thank you very much for this consult. These services were provided by Stephens November, NP-C, in collaboration with Andrey Farmer, MD, with whom I have discussed this patient in full.   Stephens November, NP-C

## 2021-03-16 DIAGNOSIS — Z881 Allergy status to other antibiotic agents status: Secondary | ICD-10-CM | POA: Diagnosis not present

## 2021-03-16 DIAGNOSIS — K625 Hemorrhage of anus and rectum: Secondary | ICD-10-CM | POA: Diagnosis present

## 2021-03-16 DIAGNOSIS — Z885 Allergy status to narcotic agent status: Secondary | ICD-10-CM | POA: Diagnosis not present

## 2021-03-16 DIAGNOSIS — K219 Gastro-esophageal reflux disease without esophagitis: Secondary | ICD-10-CM | POA: Diagnosis present

## 2021-03-16 DIAGNOSIS — M810 Age-related osteoporosis without current pathological fracture: Secondary | ICD-10-CM | POA: Diagnosis present

## 2021-03-16 DIAGNOSIS — R9389 Abnormal findings on diagnostic imaging of other specified body structures: Secondary | ICD-10-CM | POA: Diagnosis not present

## 2021-03-16 DIAGNOSIS — R1909 Other intra-abdominal and pelvic swelling, mass and lump: Secondary | ICD-10-CM | POA: Diagnosis not present

## 2021-03-16 DIAGNOSIS — Z20822 Contact with and (suspected) exposure to covid-19: Secondary | ICD-10-CM | POA: Diagnosis present

## 2021-03-16 DIAGNOSIS — C182 Malignant neoplasm of ascending colon: Secondary | ICD-10-CM | POA: Diagnosis present

## 2021-03-16 DIAGNOSIS — Z7189 Other specified counseling: Secondary | ICD-10-CM | POA: Diagnosis not present

## 2021-03-16 DIAGNOSIS — K9189 Other postprocedural complications and disorders of digestive system: Secondary | ICD-10-CM | POA: Diagnosis not present

## 2021-03-16 DIAGNOSIS — Z888 Allergy status to other drugs, medicaments and biological substances status: Secondary | ICD-10-CM | POA: Diagnosis not present

## 2021-03-16 DIAGNOSIS — I10 Essential (primary) hypertension: Secondary | ICD-10-CM | POA: Diagnosis present

## 2021-03-16 DIAGNOSIS — D649 Anemia, unspecified: Secondary | ICD-10-CM | POA: Diagnosis not present

## 2021-03-16 DIAGNOSIS — K5730 Diverticulosis of large intestine without perforation or abscess without bleeding: Secondary | ICD-10-CM | POA: Diagnosis present

## 2021-03-16 DIAGNOSIS — Z86711 Personal history of pulmonary embolism: Secondary | ICD-10-CM | POA: Diagnosis not present

## 2021-03-16 DIAGNOSIS — N179 Acute kidney failure, unspecified: Secondary | ICD-10-CM | POA: Diagnosis not present

## 2021-03-16 DIAGNOSIS — Z79899 Other long term (current) drug therapy: Secondary | ICD-10-CM | POA: Diagnosis not present

## 2021-03-16 DIAGNOSIS — I159 Secondary hypertension, unspecified: Secondary | ICD-10-CM | POA: Diagnosis not present

## 2021-03-16 DIAGNOSIS — C189 Malignant neoplasm of colon, unspecified: Secondary | ICD-10-CM | POA: Diagnosis not present

## 2021-03-16 DIAGNOSIS — D638 Anemia in other chronic diseases classified elsewhere: Secondary | ICD-10-CM | POA: Diagnosis present

## 2021-03-16 DIAGNOSIS — Z7901 Long term (current) use of anticoagulants: Secondary | ICD-10-CM | POA: Diagnosis not present

## 2021-03-16 DIAGNOSIS — K6389 Other specified diseases of intestine: Secondary | ICD-10-CM | POA: Diagnosis not present

## 2021-03-16 DIAGNOSIS — I2699 Other pulmonary embolism without acute cor pulmonale: Secondary | ICD-10-CM | POA: Diagnosis not present

## 2021-03-16 DIAGNOSIS — Z86000 Personal history of in-situ neoplasm of breast: Secondary | ICD-10-CM | POA: Diagnosis not present

## 2021-03-16 DIAGNOSIS — C188 Malignant neoplasm of overlapping sites of colon: Secondary | ICD-10-CM | POA: Diagnosis not present

## 2021-03-16 DIAGNOSIS — K449 Diaphragmatic hernia without obstruction or gangrene: Secondary | ICD-10-CM | POA: Diagnosis present

## 2021-03-16 DIAGNOSIS — C218 Malignant neoplasm of overlapping sites of rectum, anus and anal canal: Secondary | ICD-10-CM | POA: Diagnosis not present

## 2021-03-16 DIAGNOSIS — Z66 Do not resuscitate: Secondary | ICD-10-CM | POA: Diagnosis not present

## 2021-03-16 DIAGNOSIS — M419 Scoliosis, unspecified: Secondary | ICD-10-CM | POA: Diagnosis present

## 2021-03-16 LAB — HEMOGLOBIN AND HEMATOCRIT, BLOOD
HCT: 25.6 % — ABNORMAL LOW (ref 36.0–46.0)
HCT: 28 % — ABNORMAL LOW (ref 36.0–46.0)
Hemoglobin: 8.1 g/dL — ABNORMAL LOW (ref 12.0–15.0)
Hemoglobin: 8.7 g/dL — ABNORMAL LOW (ref 12.0–15.0)

## 2021-03-16 LAB — BASIC METABOLIC PANEL
Anion gap: 7 (ref 5–15)
BUN: 11 mg/dL (ref 8–23)
CO2: 29 mmol/L (ref 22–32)
Calcium: 8.9 mg/dL (ref 8.9–10.3)
Chloride: 101 mmol/L (ref 98–111)
Creatinine, Ser: 0.72 mg/dL (ref 0.44–1.00)
GFR, Estimated: >60 mL/min (ref >60)
Glucose, Bld: 88 mg/dL (ref 70–99)
Potassium: 3.9 mmol/L (ref 3.5–5.1)
Sodium: 137 mmol/L (ref 135–145)

## 2021-03-16 LAB — C DIFFICILE QUICK SCREEN W PCR REFLEX
C Diff antigen: NEGATIVE
C Diff interpretation: NOT DETECTED
C Diff toxin: NEGATIVE

## 2021-03-16 MED ORDER — PEG 3350-KCL-NA BICARB-NACL 420 G PO SOLR
4000.0000 mL | Freq: Once | ORAL | Status: AC
  Administered 2021-03-16: 4000 mL via ORAL
  Filled 2021-03-16: qty 4000

## 2021-03-16 NOTE — Plan of Care (Signed)
  Problem: Clinical Measurements: Goal: Ability to maintain clinical measurements within normal limits will improve Outcome: Progressing Goal: Will remain free from infection Outcome: Progressing Goal: Diagnostic test results will improve Outcome: Progressing Goal: Respiratory complications will improve Outcome: Progressing Goal: Cardiovascular complication will be avoided Outcome: Progressing   Problem: Activity: Goal: Risk for activity intolerance will decrease Outcome: Progressing   Problem: Pain Managment: Goal: General experience of comfort will improve Outcome: Progressing   Pt is involved in and agrees with the plan of care. V/S stable. Complained of back pain; tylenol given. 1x Bm noted this shift; no bloody in stools. Ambulates at the hallway with standby assist.

## 2021-03-16 NOTE — Care Plan (Signed)
Plan for colonoscopy tomorrow. Prep ordered. Please check INR in AM. Avoid any heparin products. NPO at midnight except prep.  Raylene Miyamoto MD, MPH [place]

## 2021-03-16 NOTE — Progress Notes (Signed)
Patient tele order is been d/c per physician Kurtis Bushman) order, and pulse ox per patient. Patient stated that she has Raynaud and it is hard to get reading from her hands. The monitor was continuous beeping at patient bedside.   The patient SATs are within normal range.

## 2021-03-16 NOTE — Progress Notes (Signed)
Colon prep procedure started at 1833, patient stated she will drink half as she talked to the provider and half in the morning. Bedside commode provided to the patient.

## 2021-03-16 NOTE — Progress Notes (Signed)
PROGRESS NOTE    Kristina Rowe  OEV:[id number] DOB: [birth year] DOA: 03/15/2021 PCP: Haydee Salter, MD    Brief Narrative:   Kristina Rowe is a 87 y.o. 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 She states that prior to leaving the hospital that she had loose stools containing blood and was told this was expected following surgery. Since her discharge home she has had 3 episodes of stool containing bright red blood and states that she has fecal incontinence.  This concerned her and prompted her return to the emergency room. She has abdominal discomfort in the right lower quadrant but denies having any pain. She complains of feeling dizzy and lightheaded but has not had any falls.    Consultants:   General surgery, GI  Procedures: CT of abdomen and pelvis: 1. Bulky ascending colon mass compatible with carcinoma. Possible ileocolic adenopathy and extra serosal extension. 2. No recurrent volvulus. 3. Left groin hernia, potentially femoral. 4. Numerous chronic findings are described above.   Antimicrobials:       Subjective: Has no complaints. Denies abd pain, n/v  Objective: Vitals:   03/15/21 1813 03/15/21 2024 03/16/21 0009 03/16/21 0405  BP: 137/73 135/85 111/70 118/75  Pulse: 86 85 77 82  Resp: 18 16 18 16   Temp: 98.6 F (37 C) 98.6 F (37 C) 97.7 F (36.5 C) 97.8 F (36.6 C)  TempSrc: Oral Oral Oral Oral  SpO2: 99% 99% 96% 96%  Weight:      Height:        Intake/Output Summary (Last 24 hours) at 03/16/2021 0837 Last data filed at 03/15/2021 2243 Gross per 24 hour  Intake 3 ml  Output --  Net 3 ml   Filed Weights   03/15/21 0449  Weight: 40.8 kg     Examination:  General exam: Appears calm and comfortable  Respiratory system: Clear to auscultation. Respiratory effort normal. Cardiovascular system: S1 & S2 heard, RRR. No JVD, murmurs, rubs, gallops or clicks.  Gastrointestinal system: Abdomen is mildly distended. soft and nontender. Normal bowel sounds heard. Central nervous system: Alert and oriented.  Grossly intact Extremities: No edema Skin: Warm dry Psychiatry: Judgement and insight appear normal. Mood & affect appropriate.     Data Reviewed: I have personally reviewed following labs and imaging studies  CBC: Recent Labs  Lab 03/09/21 2306 03/11/21 0636 03/13/21 1036 03/14/21 0459 03/15/21 0514 03/15/21 1834 03/16/21 0533  WBC 6.6 9.5 7.4 6.3 5.6  --   --   NEUTROABS 5.3  --   --   --  3.8  --   --   HGB 9.2* 8.0* 8.9* 7.9* 9.4* 8.1* 8.1*  HCT 29.8* 25.5* 28.4* 24.6* 29.8* 26.5* 25.6*  MCV 87.9 88.2 88.8 86.9 88.4  --   --   PLT 436* 379 462* 391 498*  --   --    Basic Metabolic Panel: Recent Labs  Lab 03/09/21 2306 03/11/21 0636 03/15/21 0514 03/16/21 0533  NA 134* 136 137 137  K 4.4 4.2 3.9 3.9  CL 99 103 102 101  CO2 29 24 28 29   GLUCOSE 111* 83 102* 88  BUN 22 22 9 11   CREATININE 0.89 0.97 0.87 0.72  CALCIUM 9.0 8.5* 9.2 8.9  MG  --  1.7  --   --    GFR: Estimated Creatinine Clearance: 31.3 mL/min (by C-G formula based on SCr of 0.72 mg/dL). Liver Function Tests: Recent Labs  Lab 03/09/21 2306 03/15/21 0514  AST 25 26  ALT 14 14  ALKPHOS 84 66  BILITOT 0.5 0.5  PROT 6.5 6.6  ALBUMIN 3.4* 3.2*   Recent Labs  Lab 03/09/21 2306 03/15/21 0514  LIPASE 24 42   No results for input(s): AMMONIA in the last 168 hours. Coagulation Profile: Recent Labs  Lab 03/11/21 0520 03/12/21 0519 03/13/21 1036 03/14/21 0459 03/15/21 0514  INR 1.3* 1.3* 1.5* 2.2* 2.2*   Cardiac Enzymes: No results for input(s): CKTOTAL, CKMB, CKMBINDEX, TROPONINI in the last 168 hours. BNP (last 3  results) No results for input(s): PROBNP in the last 8760 hours. HbA1C: No results for input(s): HGBA1C in the last 72 hours. CBG: No results for input(s): GLUCAP in the last 168 hours. Lipid Profile: No results for input(s): CHOL, HDL, LDLCALC, TRIG, CHOLHDL, LDLDIRECT in the last 72 hours. Thyroid Function Tests: No results for input(s): TSH, T4TOTAL, FREET4, T3FREE, THYROIDAB in the last 72 hours. Anemia Panel: No results for input(s): VITAMINB12, FOLATE, FERRITIN, TIBC, IRON, RETICCTPCT in the last 72 hours. Sepsis Labs: Recent Labs  Lab 03/10/21 0137 03/10/21 0347  LATICACIDVEN 0.9 1.1    Recent Results (from the past 240 hour(s))  Resp Panel by RT-PCR (Flu A&B, Covid) Nasopharyngeal Swab     Status: None   Collection Time: 03/10/21  1:37 AM   Specimen: Nasopharyngeal Swab; Nasopharyngeal(NP) swabs in vial transport medium  Result Value Ref Range Status   SARS Coronavirus 2 by RT PCR NEGATIVE NEGATIVE Final    Comment: (NOTE) SARS-CoV-2 target nucleic acids are NOT DETECTED.  The SARS-CoV-2 RNA is generally detectable in upper respiratory specimens during the acute phase of infection. The lowest concentration of SARS-CoV-2 viral copies this assay can detect is 138 copies/mL. A negative result does not preclude SARS-Cov-2 infection and should not be used as the sole basis for treatment or other patient management decisions. A negative result may occur with  improper specimen collection/handling, submission of specimen other than nasopharyngeal swab, presence of viral mutation(s) within the areas targeted by this assay, and inadequate number of viral copies(<138 copies/mL). A negative result must be combined with clinical observations, patient history, and epidemiological information. The expected result is Negative.  Fact Sheet for Patients:  EntrepreneurPulse.com.au  Fact Sheet for Healthcare Providers:   IncredibleEmployment.be  This test is no t yet approved or cleared by the [place] FDA and  has been authorized for detection and/or diagnosis of SARS-CoV-2 by FDA under an Emergency Use Authorization (EUA). This EUA will remain  in effect (meaning this test can be used) for the duration of the COVID-19 declaration under Section 564(b)(1) of the Act, 21 U.S.C.section 360bbb-3(b)(1), unless the authorization is terminated  or revoked sooner.       Influenza A by PCR NEGATIVE NEGATIVE Final   Influenza B by PCR NEGATIVE NEGATIVE Final    Comment: (NOTE) The Xpert Xpress SARS-CoV-2/FLU/RSV plus assay is intended as an aid in the diagnosis of influenza from Nasopharyngeal swab specimens and should not be used as a sole basis for treatment. Nasal washings and aspirates are unacceptable for Xpert Xpress SARS-CoV-2/FLU/RSV testing.  Fact Sheet for Patients: EntrepreneurPulse.com.au  Fact Sheet for Healthcare Providers: IncredibleEmployment.be  This test is not yet approved or cleared by the [place] FDA and has been authorized for detection  and/or diagnosis of SARS-CoV-2 by FDA under an Emergency Use Authorization (EUA). This EUA will remain in effect (meaning this test can be used) for the duration of the COVID-19 declaration under Section 564(b)(1) of the Act, 21 U.S.C. section 360bbb-3(b)(1), unless the authorization is terminated or revoked.  Performed at [facility], [address] [place], [place] [address]   Aerobic/Anaerobic Culture w Gram Stain (surgical/deep wound)     Status: None   Collection Time: 03/10/21  5:29 AM   Specimen: PATH Other; Body Fluid  Result Value Ref Range Status   Specimen Description   Final    ABDOMEN INTRA ABDOMINAL FLUID Performed at [facility], [address], [place], [place] [address]    Special Requests   Final    NONE Performed at [facility], [place], [place] [address]    Gram Stain NO WBC SEEN NO ORGANISMS SEEN   Final   Culture   Final    No growth aerobically or anaerobically. Performed at [facility], [place] [address] [place], [place] [address]    Report Status 03/15/2021 FINAL  Final  Resp Panel by RT-PCR (Flu A&B, Covid) Nasopharyngeal Swab     Status: None   Collection Time: 03/15/21  6:08 AM   Specimen: Nasopharyngeal Swab; Nasopharyngeal(NP) swabs in vial transport medium  Result Value Ref Range Status   SARS Coronavirus 2 by RT PCR NEGATIVE NEGATIVE Final    Comment: (NOTE) SARS-CoV-2 target nucleic acids are NOT DETECTED.  The SARS-CoV-2 RNA is generally detectable in upper respiratory specimens during the acute phase of infection. The lowest concentration of SARS-CoV-2 viral copies this assay can detect is 138 copies/mL. A negative result does not preclude SARS-Cov-2 infection and should not be used as the sole basis for treatment or other patient management decisions. A negative result may occur with  improper specimen collection/handling, submission of specimen other than nasopharyngeal swab, presence of viral mutation(s) within the areas targeted by this assay, and inadequate number of viral copies(<138 copies/mL). A negative result must be combined with clinical observations, patient history, and epidemiological information. The expected result is Negative.  Fact Sheet for Patients:  EntrepreneurPulse.com.au  Fact Sheet for Healthcare Providers:  IncredibleEmployment.be  This test is no t yet approved or cleared by the [place] FDA and  has been authorized for detection and/or diagnosis of SARS-CoV-2 by FDA under an Emergency Use Authorization (EUA). This EUA will remain  in effect (meaning this test can be used) for the duration of the COVID-19 declaration under Section 564(b)(1) of the Act, 21 U.S.C.section 360bbb-3(b)(1),  unless the authorization is terminated  or revoked sooner.       Influenza A by PCR NEGATIVE NEGATIVE Final   Influenza B by PCR NEGATIVE NEGATIVE Final    Comment: (NOTE) The Xpert Xpress SARS-CoV-2/FLU/RSV plus assay is intended as an aid in the diagnosis of influenza from Nasopharyngeal swab specimens and should not be used as a sole basis for treatment. Nasal washings and aspirates are unacceptable for Xpert Xpress SARS-CoV-2/FLU/RSV testing.  Fact Sheet for Patients: EntrepreneurPulse.com.au  Fact Sheet for Healthcare Providers: IncredibleEmployment.be  This test is not yet approved or cleared by the [place] FDA and has been authorized for detection and/or diagnosis of SARS-CoV-2 by FDA under an Emergency Use Authorization (EUA). This EUA will remain in effect (meaning this test can be used) for the duration of the COVID-19 declaration under Section 564(b)(1) of the Act, 21 U.S.C.  section 360bbb-3(b)(1), unless the authorization is terminated or revoked.  Performed at [facility], [address] [place], [place] [address]          Radiology Studies: CT ABDOMEN PELVIS W CONTRAST  Result Date: 03/15/2021 CLINICAL DATA:  Rectal bleeding that started on Friday. Recent volvulus surgery EXAM: CT ABDOMEN AND PELVIS WITH CONTRAST TECHNIQUE: Multidetector CT imaging of the abdomen and pelvis was performed using the standard protocol following bolus administration of intravenous contrast. CONTRAST:  31mL OMNIPAQUE IOHEXOL 300 MG/ML  SOLN COMPARISON:  Five days ago FINDINGS: Lower chest: Trace pleural effusions and bilateral lower lobe atelectasis. Right middle lobe is collapsed with a chronic appearance in the setting of marked scoliosis. Mitral and aortic annular calcification. Aortic valve calcification. Hepatobiliary: No focal liver abnormality.No evidence of biliary obstruction or stone. Pancreas: Unremarkable. Spleen:  Unremarkable. Adrenals/Urinary Tract: Negative adrenals. No hydronephrosis or stone. Unremarkable bladder. Stomach/Bowel: No obstruction or recurrent volvulus appearance. Masslike thickening the ascending colon with indistinguishable medial mass and adjacent ileocolic vessels, possibly extra serosal extension. The mass measures at least 6 cm in length. Vascular/Lymphatic: Less than typical atheromatous changes for age. Accentuated ileocolic vessels around the described mass. Ileocolic lymph nodes measure up to 13 x 8 mm. Reproductive:Unremarkable for age Other: Small volume pneumoperitoneum, expected after recent surgery. Abdominal wall gas which is also expected. Fluid density collection in the left groin related to hernia, possibly thin oral. No internal bowel is seen traversing the neck. Musculoskeletal: Advanced spinal degeneration and levoscoliosis. IMPRESSION: 1. Bulky ascending colon mass compatible with carcinoma. Possible ileocolic adenopathy and extra serosal extension. 2. No recurrent volvulus. 3. Left groin hernia, potentially femoral. 4. Numerous chronic findings are described above. Electronically Signed   By: Monte Fantasia M.D.   On: 03/15/2021 06:57        Scheduled Meds: . amitriptyline  10 mg Oral QHS  . calcium carbonate  1,250 mg Oral Q breakfast  . cholecalciferol  1,000 Units Oral Daily  . estradiol  1 Applicatorful Vaginal Once per day on Mon Wed Fri  . folic acid  1 mg Oral Daily  . mirtazapine  30 mg Oral QHS  . pantoprazole  40 mg Oral Daily  . sodium chloride flush  3 mL Intravenous Q12H  . SUMAtriptan  50 mg Oral UD  . cyanocobalamin  1,000 mcg Oral Daily   Continuous Infusions: . sodium chloride      Assessment & Plan:   Principal Problem:   Rectal bleeding Active Problems:   Anemia, unspecified   Hypertension   GERD (gastroesophageal reflux disease)   Colonic mass   Pulmonary embolism (HCC)   Rectal bleeding  Patient presents to the ER for  evaluation of several episodes of rectal bleeding consisting of bright red blood. 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  Returns and found with CT scan of abdomen and pelvis shows a bulky mass in the ascending colon compatible with carcinoma which may explain patient's rectal bleeding.  4/19-GI and surgery consulted. Coumadin on hold Plan for colonoscopy in a.m.:  Pending colonoscopy findings, potential for elective colectomy Thursday versus Friday with Dr. Hampton Abbot H&H stable continue to monitor Transfuse if hemoglobin less than 7   Hypertension Hold BP meds as patient's blood pressure is stable      History of pulmonary embolism on chronic anticoagulation therapy Coumadin on hold due to rectal bleeding INR was 2.2 we will check, if hemoglobin  stable and able to place on IV heparin may need to do As a bridge   Hiatal hernia with GERD Continue Protonix   DVT prophylaxis: SCD Code Status: DNR Family Communication: Family at bedside  Status is: Observation  The patient remains OBS appropriate and will d/c before 2 midnights.  Dispo: The patient is from: Home              Anticipated d/c is to: Home              Patient currently is not medically stable to d/c.   Difficult to place patient No            LOS: 0 days   Time spent: 35 minutes with more than 50% on [place], MD [facility] Pager [phone]  If 7PM-7AM, please contact night-coverage 03/16/2021, 8:37 AM

## 2021-03-16 NOTE — Progress Notes (Signed)
[facility] Day(s): 0.   Interval History:  Patient seen and examined No acute events or new complaints overnight.  Patient reports she has some abdominal pain, in the RLQ, unchanged from yesterday No fever, chills, nausea, emesis Hgb low but appears stabilized at 8.1 BMP is grossly normal She was on regular diet yesterday; plan for CLD today; NPO at midnight No further bloody stools Plan for colonoscopy tomorrow (04/20)   Vital signs in last 24 hours: [min-max] current  Temp:  [97.7 F (36.5 C)-98.6 F (37 C)] 97.8 F (36.6 C) (04/19 0405) Pulse Rate:  [77-94] 82 (04/19 0405) Resp:  [13-21] 16 (04/19 0405) BP: (111-145)/(70-90) 118/75 (04/19 0405) SpO2:  [92 %-100 %] 96 % (04/19 0405)     Height: 5\' 1"  (154.9 cm) Weight: 40.8 kg BMI (Calculated): 17   Intake/Output last 2 shifts:  04/18 0701 - 04/19 0700 In: 3 [I.V.:3] Out: -    Physical Exam:  Constitutional: alert, cooperative and no distress  Respiratory: breathing non-labored at rest  Cardiovascular: regular rate and sinus rhythm  Gastrointestinal: Soft, she is very point tender in the RLQ, non-distended, no rebound/guarding.  Integumentary: Laparoscopic incision is CDI with staples, no erythema or drainage. Previous drain site covered with gauze.   Labs:  CBC Latest Ref Rng & Units 03/16/2021 03/15/2021 03/15/2021  WBC 4.0 - 10.5 K/uL - - 5.6  Hemoglobin 12.0 - 15.0 g/dL 8.1(L) 8.1(L) 9.4(L)  Hematocrit 36.0 - 46.0 % 25.6(L) 26.5(L) 29.8(L)  Platelets 150 - 400 K/uL - - 498(H)   CMP Latest Ref Rng & Units 03/16/2021 03/15/2021 03/11/2021  Glucose 70 - 99 mg/dL 88 102(H) 83  BUN 8 - 23 mg/dL 11 9 22   Creatinine 0.44 - 1.00 mg/dL 0.72 0.87 0.97  Sodium 135 - 145 mmol/L 137 137 136  Potassium 3.5 - 5.1 mmol/L 3.9 3.9 4.2  Chloride 98 - 111 mmol/L 101 102 103  CO2 22 - 32 mmol/L 29 28 24   Calcium 8.9 - 10.3 mg/dL 8.9 9.2 8.5(L)  Total Protein 6.5 - 8.1 g/dL - 6.6 -   Total Bilirubin 0.3 - 1.2 mg/dL - 0.5 -  Alkaline Phos 38 - 126 U/L - 66 -  AST 15 - 41 U/L - 26 -  ALT 0 - 44 U/L - 14 -    Imaging studies: No new pertinent imaging studies   Assessment/Plan:  87 y.o. female found to have ascending colon mass concerning for malignancy now 6 days s/p exploratory laparotomy and lysis of adhesionsfor mesenteric volvulus.   - CLD today per GI recommendations: NPO at midnight  - Appreciate GI assistance; plan for colonoscopy tomorrow  - Pending colonoscopy findings; potential elective colectomy Thursday vs Friday with Dr Hampton Abbot, defer timing to him  - Monitor abdominal examination; on-going bowel function  - Monitor H&H; stable; transfuse as needed   - Pain control prn; antiemetics prn  - mobilization as tolerated  - Monitor INR   - Further management per primary service; we will follow along   All of the above findings and recommendations were discussed with the patient, and the medical team, and all of patient's questions were answered to her expressed satisfaction.  -- Edison Simon, PA-C [facility] 03/16/2021, 6:59 AM [phone] M-F: 7am - 4pm

## 2021-03-17 ENCOUNTER — Encounter
Admission: EM | Disposition: A | Discharge status: Home Health | Payer: Self-pay | Source: Home / Self Care | Attending: Internal Medicine

## 2021-03-17 ENCOUNTER — Inpatient Hospital Stay: Payer: Medicare Other | Admitting: Anesthesiology

## 2021-03-17 ENCOUNTER — Encounter: Payer: Self-pay | Admitting: Internal Medicine

## 2021-03-17 DIAGNOSIS — I159 Secondary hypertension, unspecified: Secondary | ICD-10-CM

## 2021-03-17 DIAGNOSIS — D649 Anemia, unspecified: Secondary | ICD-10-CM

## 2021-03-17 DIAGNOSIS — R1909 Other intra-abdominal and pelvic swelling, mass and lump: Secondary | ICD-10-CM

## 2021-03-17 DIAGNOSIS — K6389 Other specified diseases of intestine: Secondary | ICD-10-CM

## 2021-03-17 HISTORY — PX: COLONOSCOPY: SHX5424

## 2021-03-17 LAB — HEMOGLOBIN AND HEMATOCRIT, BLOOD
HCT: 24.9 % — ABNORMAL LOW (ref 36.0–46.0)
HCT: 24 % — ABNORMAL LOW (ref 36.0–46.0)
Hemoglobin: 8.1 g/dL — ABNORMAL LOW (ref 12.0–15.0)
Hemoglobin: 7.5 g/dL — ABNORMAL LOW (ref 12.0–15.0)

## 2021-03-17 LAB — PROTIME-INR
INR: 1.2 (ref 0.8–1.2)
Prothrombin Time: 15.1 seconds (ref 11.4–15.2)

## 2021-03-17 SURGERY — COLONOSCOPY
Anesthesia: General

## 2021-03-17 MED ORDER — LIDOCAINE HCL (CARDIAC) PF 100 MG/5ML IV SOSY
PREFILLED_SYRINGE | INTRAVENOUS | Status: DC | PRN
  Administered 2021-03-17: 100 mg via INTRAVENOUS

## 2021-03-17 MED ORDER — SODIUM CHLORIDE 0.9 % IV SOLN: INTRAVENOUS | Status: DC

## 2021-03-17 MED ORDER — PROPOFOL 10 MG/ML IV BOLUS
INTRAVENOUS | Status: DC | PRN
  Administered 2021-03-17: 50 mg via INTRAVENOUS

## 2021-03-17 MED ORDER — METRONIDAZOLE 500 MG PO TABS
1000.0000 mg | ORAL_TABLET | Freq: Four times a day (QID) | ORAL | Status: AC
  Administered 2021-03-17 – 2021-03-18 (×3): 1000 mg via ORAL
  Filled 2021-03-17 (×3): qty 2

## 2021-03-17 MED ORDER — PROPOFOL 500 MG/50ML IV EMUL
INTRAVENOUS | Status: DC | PRN
  Administered 2021-03-17: 100 ug/kg/min via INTRAVENOUS

## 2021-03-17 MED ORDER — NEOMYCIN SULFATE 500 MG PO TABS
1000.0000 mg | ORAL_TABLET | Freq: Four times a day (QID) | ORAL | Status: AC
  Administered 2021-03-17 – 2021-03-18 (×3): 1000 mg via ORAL
  Filled 2021-03-17 (×5): qty 2

## 2021-03-17 MED ORDER — ERYTHROMYCIN BASE 250 MG PO TABS
500.0000 mg | ORAL_TABLET | Freq: Four times a day (QID) | ORAL | Status: DC
  Filled 2021-03-17 (×3): qty 2

## 2021-03-17 MED ORDER — SODIUM CHLORIDE 0.9 % IV SOLN
2.0000 g | INTRAVENOUS | Status: AC
  Administered 2021-03-18: 2 g via INTRAVENOUS
  Filled 2021-03-17: qty 2

## 2021-03-17 NOTE — Progress Notes (Addendum)
[facility] Day(s): 1.   Interval History:  Patient seen and examined No acute events or new complaints overnight.  Patient reports she is tired from bowel prep No complaints of abdominal pain, nausea, emesis Hgb remains stable, 8.1 this morning Plan for colonoscopy today with GI; tolerated prep overnight reasonably well  Vital signs in last 24 hours: [min-max] current  Temp:  [97.4 F (36.3 C)-98.7 F (37.1 C)] 98.7 F (37.1 C) (04/20 0736) Pulse Rate:  [75-92] 92 (04/20 0736) Resp:  [16-18] 18 (04/20 0736) BP: (119-147)/(73-91) 119/78 (04/20 0736) SpO2:  [100 %] 100 % (04/20 0736)     Height: 5\' 1"  (154.9 cm) Weight: 40.8 kg BMI (Calculated): 17   Intake/Output last 2 shifts:  04/19 0701 - 04/20 0700 In: 183 [P.O.:180; I.V.:3] Out: -    Physical Exam:  Constitutional: alert, cooperative and no distress  Respiratory: breathing non-labored at rest  Cardiovascular: regular rate and sinus rhythm  Gastrointestinal: Soft, remains point tender in the RLQ, non-distended, no rebound/guarding.  Integumentary: Laparoscopic incision is CDI with staples, no erythema or drainage. Previous drain site covered with gauze.    Labs:  CBC Latest Ref Rng & Units 03/17/2021 03/16/2021 03/16/2021  WBC 4.0 - 10.5 K/uL - - -  Hemoglobin 12.0 - 15.0 g/dL 8.1(L) 8.7(L) 8.1(L)  Hematocrit 36.0 - 46.0 % 24.9(L) 28.0(L) 25.6(L)  Platelets 150 - 400 K/uL - - -   CMP Latest Ref Rng & Units 03/16/2021 03/15/2021 03/11/2021  Glucose 70 - 99 mg/dL 88 102(H) 83  BUN 8 - 23 mg/dL 11 9 22   Creatinine 0.44 - 1.00 mg/dL 0.72 0.87 0.97  Sodium 135 - 145 mmol/L 137 137 136  Potassium 3.5 - 5.1 mmol/L 3.9 3.9 4.2  Chloride 98 - 111 mmol/L 101 102 103  CO2 22 - 32 mmol/L 29 28 24   Calcium 8.9 - 10.3 mg/dL 8.9 9.2 8.5(L)  Total Protein 6.5 - 8.1 g/dL - 6.6 -  Total Bilirubin 0.3 - 1.2 mg/dL - 0.5 -  Alkaline Phos 38 - 126 U/L - 66 -  AST 15 - 41 U/L - 26 -   ALT 0 - 44 U/L - 14 -     Imaging studies: No new pertinent imaging studies   Assessment/Plan: 87 y.o. female found to have ascending colon mass concerning for malignancy now 6 days s/pexploratory laparotomy and lysis of adhesionsfor mesenteric volvulus.   - NPO for now; okay for CLD following procedure, would not advance past CLD given plans for eventual colectomy this week  - Appreciate GI assistance; plan for colonoscopy today             - Pending colonoscopy findings; potential elective colectomy Thursday vs Friday with Dr Hampton Abbot, defer timing to him             - Monitor abdominal examination; on-going bowel function             - Monitor H&H; stable; transfuse as needed              - Pain control prn; antiemetics prn             - mobilization as tolerated             - Monitor INR              - Further management per primary service; we will follow along    All of the above findings and recommendations were  discussed with the patient, and the medical team, and all of patient's questions were answered to her expressed satisfaction.  -- Edison Simon, PA-C [facility] 03/17/2021, 8:12 AM [phone] M-F: 7am - 4pm

## 2021-03-17 NOTE — Care Plan (Signed)
Patient with slightly elevated INR. Ok to do biopsies but unable to use cautery. Explained this to patient.  Raylene Miyamoto MD, MPH [place]

## 2021-03-17 NOTE — Anesthesia Preprocedure Evaluation (Addendum)
Anesthesia Evaluation  Patient identified by MRN, date of birth, ID band Patient awake    Reviewed: Allergy & Precautions, NPO status , Patient's Chart, lab work & pertinent test results  History of Anesthesia Complications Negative for: history of anesthetic complications  Airway Mallampati: III       Dental   Pulmonary neg sleep apnea, neg COPD, Not current smoker,           Cardiovascular hypertension, Pt. on medications (-) Past MI and (-) CHF (-) dysrhythmias (-) Valvular Problems/Murmurs     Neuro/Psych neg Seizures    GI/Hepatic Neg liver ROS, hiatal hernia, GERD  Medicated and Controlled,  Endo/Other  neg diabetes  Renal/GU negative Renal ROS     Musculoskeletal   Abdominal   Peds  Hematology  (+) anemia ,   Anesthesia Other Findings   Reproductive/Obstetrics                            Anesthesia Physical Anesthesia Plan  ASA: II  Anesthesia Plan: General   Post-op Pain Management:    Induction: Intravenous  PONV Risk Score and Plan: 3 and Propofol infusion, TIVA and Treatment may vary due to age or medical condition  Airway Management Planned: Nasal Cannula  Additional Equipment:   Intra-op Plan:   Post-operative Plan:   Informed Consent: I have reviewed the patients History and Physical, chart, labs and discussed the procedure including the risks, benefits and alternatives for the proposed anesthesia with the patient or authorized representative who has indicated his/her understanding and acceptance.       Plan Discussed with:   Anesthesia Plan Comments:         Anesthesia Quick Evaluation

## 2021-03-17 NOTE — Anesthesia Postprocedure Evaluation (Signed)
Anesthesia Post Note  Patient: Kristina Rowe  Procedure(s) Performed: COLONOSCOPY (N/A )  Patient location during evaluation: Endoscopy Anesthesia Type: General Level of consciousness: awake and alert Pain management: pain level controlled Vital Signs Assessment: post-procedure vital signs reviewed and stable Respiratory status: spontaneous breathing and respiratory function stable Cardiovascular status: stable Anesthetic complications: no   No complications documented.   Last Vitals:  Vitals:   03/17/21 1335 03/17/21 1405  BP: 99/61 116/78  Pulse:    Resp:    Temp:    SpO2:      Last Pain:  Vitals:   03/17/21 1405  TempSrc:   PainSc: 0-No pain                 Alden Feagan K

## 2021-03-17 NOTE — Op Note (Signed)
[facility] Gastroenterology Patient Name: Kristina Rowe Procedure Date: 03/17/2021 1:04 PM MRN: [id number] Account #: [id number] Date of Birth: [birth year] Admit Type: Outpatient Age: 87 Room: [facility] ENDO ROOM 4 Gender: Female Note Status: Finalized Procedure:             Colonoscopy Indications:           Abnormal CT of the GI tract Providers:             Andrey Farmer MD, MD Medicines:             Monitored Anesthesia Care Complications:         No immediate complications. Estimated blood loss:                         Minimal. Procedure:             Pre-Anesthesia Assessment:                        - Prior to the procedure, a History and Physical was                         performed, and patient medications and allergies were                         reviewed. The patient is competent. The risks and                         benefits of the procedure and the sedation options and                         risks were discussed with the patient. All questions                         were answered and informed consent was obtained.                         Patient identification and proposed procedure were                         verified by the physician, the nurse, the anesthetist                         and the technician in the endoscopy suite. Mental                         Status Examination: alert and oriented. Airway                         Examination: normal oropharyngeal airway and neck                         mobility. Respiratory Examination: clear to                         auscultation. CV Examination: normal. Prophylactic                         Antibiotics: The patient does not require prophylactic  antibiotics. Prior Anticoagulants: The patient has                         taken Coumadin (warfarin), last dose was 3 days prior                         to procedure. ASA Grade Assessment: II - A patient                         with  mild systemic disease. After reviewing the risks                         and benefits, the patient was deemed in satisfactory                         condition to undergo the procedure. The anesthesia                         plan was to use monitored anesthesia care (MAC).                         Immediately prior to administration of medications,                         the patient was re-assessed for adequacy to receive                         sedatives. The heart rate, respiratory rate, oxygen                         saturations, blood pressure, adequacy of pulmonary                         ventilation, and response to care were monitored                         throughout the procedure. The physical status of the                         patient was re-assessed after the procedure.                        After obtaining informed consent, the colonoscope was                         passed under direct vision. Throughout the procedure,                         the patient's blood pressure, pulse, and oxygen                         saturations were monitored continuously. The                         Colonoscope was introduced through the anus with the                         intention of advancing to the cecum. The  scope was                         advanced to the ascending colon before the procedure                         was aborted. Medications were given. The colonoscopy                         was aborted due to a partially obstructing mass. The                         patient tolerated the procedure well. The quality of                         the bowel preparation was good. Findings:      The perianal and digital rectal examinations were normal.      A fungating and ulcerated partially obstructing large mass was found in       the distal ascending colon. The mass was circumferential. Was able to       maneuver through middle of mass and see the cecum on the other side but        unable to advance past mass due to obstruction. No bleeding was present.       This was biopsied with a cold forceps for histology. The mass seemed to       be just proximal to hepatic flexture so the area was tattooed with an       injection of dye a few centimeters distal to mass which caused that       tattoo's to be mainly in the proximal transvere colon.      A few small-mouthed diverticula were found in the sigmoid colon.      The exam was otherwise without abnormality on direct and retroflexion       views. Impression:            - The procedure was aborted due to partially                         obstructing mass.                        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 Biopsied. Tattooed.                        - Diverticulosis in the sigmoid colon.                        - The examination was otherwise normal on direct and                         retroflexion views. Recommendation:        - Return patient to hospital ward for ongoing care.                        - Clear liquid diet.                        -  Continue present medications.                        - Await pathology results.                        - Perform CT scan (computed tomography) of the chest. Procedure Code(s):     --- Professional ---                        [phone], 52, Colonoscopy, flexible; with directed                         submucosal injection(s), any substance                        70263, 29, Colonoscopy, flexible; with biopsy, single                         or multiple Diagnosis Code(s):     --- Professional ---                        D49.0, Neoplasm of unspecified behavior of digestive                         system                        K56.690, Other partial intestinal obstruction                        K57.30, Diverticulosis of large intestine without                         perforation or abscess without bleeding                         R93.3, Abnormal findings on diagnostic imaging of                         other parts of digestive tract CPT copyright 2019 American Medical Association. All rights reserved. The codes documented in this report are preliminary and upon coder review may  be revised to meet current compliance requirements. Andrey Farmer MD, MD 03/17/2021 1:46:43 PM Number of Addenda: 0 Note Initiated On: 03/17/2021 1:04 PM Scope Withdrawal Time: 0 hours 11 minutes 40 seconds  Total Procedure Duration: 0 hours 20 minutes 4 seconds  Estimated Blood Loss:  Estimated blood loss was minimal.      [facility]

## 2021-03-17 NOTE — Progress Notes (Signed)
Patient hgb showed up 7.5, Dr. Manuella Ghazi is informed. No further orders.

## 2021-03-17 NOTE — Transfer of Care (Signed)
Immediate Anesthesia Transfer of Care Note  Patient: Kristina Rowe  Procedure(s) Performed: COLONOSCOPY (N/A )  Patient Location: PACU  Anesthesia Type:MAC  Level of Consciousness: drowsy  Airway & Oxygen Therapy: Patient Spontanous Breathing  Post-op Assessment: Report given to RN and Post -op Vital signs reviewed and stable  Post vital signs: Reviewed and stable  Last Vitals:  Vitals Value Taken Time  BP 99/61 03/17/21 1335  Temp    Pulse 74 03/17/21 1336  Resp 15 03/17/21 1336  SpO2 100 % 03/17/21 1336  Vitals shown include unvalidated device data.  Last Pain:  Vitals:   03/17/21 0736  TempSrc: Oral  PainSc: 0-No pain         Complications: No complications documented.

## 2021-03-17 NOTE — Progress Notes (Signed)
PROGRESS NOTE    Kristina Rowe  VQX:[id number] DOB: 1930-10-09 DOA: 03/15/2021 PCP: Haydee Salter, MD    Brief Narrative:  Kristina Rowe is a 87 y.o. 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 She states that prior to leaving the hospital that she had loose stools containing blood and was told this was expected following surgery. Since her discharge home she has had 3 episodes of stool containing bright red blood and states that she has fecal incontinence.  This concerned her and prompted her return to the emergency room. She has abdominal discomfort in the right lower quadrant but denies having any pain. She complains of feeling dizzy and lightheaded but has not had any falls.  Consultants:   General surgery, GI  Procedures: CT of abdomen and pelvis: 1. Bulky ascending colon mass compatible with carcinoma. Possible ileocolic adenopathy and extra serosal extension. 2. No recurrent volvulus. 3. Left groin hernia, potentially femoral. 4. Numerous chronic findings are described above.     Subjective: Very appreciative for her care.  Sitting in the chair.  Waiting for colonoscopy today.  Prepared for any outcome  Objective: Vitals:   03/17/21 0736 03/17/21 1122 03/17/21 1335 03/17/21 1405  BP: 119/78 103/65 99/61 116/78  Pulse: 92 94    Resp: 18 16    Temp: 98.7 F (37.1 C) 99.8 F (37.7 C)    TempSrc: Oral  Temporal   SpO2: 100% 98%    Weight:      Height:        Intake/Output Summary (Last 24 hours) at 03/17/2021 1430 Last data filed at 03/17/2021 1331 Gross per 24 hour  Intake 333 ml  Output --  Net 333 ml   Filed Weights   03/15/21 0449  Weight: 40.8 kg     Examination:  General exam: Appears calm and comfortable  Respiratory system: Clear to auscultation. Respiratory effort normal. Cardiovascular system: S1 & S2 heard, RRR. No JVD, murmurs, rubs, gallops or clicks.  Gastrointestinal system: Abdomen is mildly distended. soft and nontender. Normal bowel sounds heard. Central nervous system: Alert and oriented.  Grossly intact Extremities: No edema Skin: Warm dry Psychiatry: Judgement and insight appear normal. Mood & affect appropriate.     Data Reviewed: I have personally reviewed following labs and imaging studies  CBC: Recent Labs  Lab 03/11/21 0636 03/13/21 1036 03/14/21 0459 03/15/21 0514 03/15/21 1834 03/16/21 0533 03/16/21 1746 03/17/21 0424  WBC 9.5 7.4 6.3 5.6  --   --   --   --   NEUTROABS  --   --   --  3.8  --   --   --   --   HGB 8.0* 8.9* 7.9* 9.4* 8.1* 8.1* 8.7* 8.1*  HCT 25.5* 28.4* 24.6* 29.8* 26.5* 25.6* 28.0* 24.9*  MCV 88.2 88.8 86.9 88.4  --   --   --   --   PLT 379 462* 391 498*  --   --   --   --    Basic Metabolic Panel: Recent Labs  Lab 03/11/21 0636 03/15/21 0514 03/16/21 0533  NA 136 137 137  K 4.2 3.9 3.9  CL 103 102 101  CO2 24 28 29   GLUCOSE 83 102* 88  BUN 22 9 11   CREATININE  0.97 0.87 0.72  CALCIUM 8.5* 9.2 8.9  MG 1.7  --   --    GFR: Estimated Creatinine Clearance: 31.3 mL/min (by C-G formula based on SCr of 0.72 mg/dL). Liver Function Tests: Recent Labs  Lab 03/15/21 0514  AST 26  ALT 14  ALKPHOS 66  BILITOT 0.5  PROT 6.6  ALBUMIN 3.2*   Recent Labs  Lab 03/15/21 0514  LIPASE 42   No results for input(s): AMMONIA in the last 168 hours. Coagulation Profile: Recent Labs  Lab 03/12/21 0519 03/13/21 1036 03/14/21 0459 03/15/21 0514 03/17/21 0424  INR 1.3* 1.5* 2.2* 2.2* 1.2   Cardiac Enzymes: No results for input(s): CKTOTAL, CKMB, CKMBINDEX, TROPONINI in the last 168 hours. BNP (last 3 results) No results for input(s): PROBNP in the last 8760  hours. HbA1C: No results for input(s): HGBA1C in the last 72 hours. CBG: No results for input(s): GLUCAP in the last 168 hours. Lipid Profile: No results for input(s): CHOL, HDL, LDLCALC, TRIG, CHOLHDL, LDLDIRECT in the last 72 hours. Thyroid Function Tests: No results for input(s): TSH, T4TOTAL, FREET4, T3FREE, THYROIDAB in the last 72 hours. Anemia Panel: No results for input(s): VITAMINB12, FOLATE, FERRITIN, TIBC, IRON, RETICCTPCT in the last 72 hours. Sepsis Labs: No results for input(s): PROCALCITON, LATICACIDVEN in the last 168 hours.  Recent Results (from the past 240 hour(s))  Resp Panel by RT-PCR (Flu A&B, Covid) Nasopharyngeal Swab     Status: None   Collection Time: 03/10/21  1:37 AM   Specimen: Nasopharyngeal Swab; Nasopharyngeal(NP) swabs in vial transport medium  Result Value Ref Range Status   SARS Coronavirus 2 by RT PCR NEGATIVE NEGATIVE Final    Comment: (NOTE) SARS-CoV-2 target nucleic acids are NOT DETECTED.  The SARS-CoV-2 RNA is generally detectable in upper respiratory specimens during the acute phase of infection. The lowest concentration of SARS-CoV-2 viral copies this assay can detect is 138 copies/mL. A negative result does not preclude SARS-Cov-2 infection and should not be used as the sole basis for treatment or other patient management decisions. A negative result may occur with  improper specimen collection/handling, submission of specimen other than nasopharyngeal swab, presence of viral mutation(s) within the areas targeted by this assay, and inadequate number of viral copies(<138 copies/mL). A negative result must be combined with clinical observations, patient history, and epidemiological information. The expected result is Negative.  Fact Sheet for Patients:  EntrepreneurPulse.com.au  Fact Sheet for Healthcare Providers:  IncredibleEmployment.be  This test is no t yet approved or cleared by the [place] FDA and  has been authorized for detection and/or diagnosis of SARS-CoV-2 by FDA under an Emergency Use Authorization (EUA). This EUA will remain  in effect (meaning this test can be used) for the duration of the COVID-19 declaration under Section 564(b)(1) of the Act, 21 U.S.C.section 360bbb-3(b)(1), unless the authorization is terminated  or revoked sooner.       Influenza A by PCR NEGATIVE NEGATIVE Final   Influenza B by PCR NEGATIVE NEGATIVE Final    Comment: (NOTE) The Xpert Xpress SARS-CoV-2/FLU/RSV plus assay is intended as an aid in the diagnosis of influenza from Nasopharyngeal swab specimens and should not be used as a sole basis for treatment. Nasal washings and aspirates are unacceptable for Xpert Xpress SARS-CoV-2/FLU/RSV testing.  Fact Sheet for Patients: EntrepreneurPulse.com.au  Fact Sheet for Healthcare Providers: IncredibleEmployment.be  This test is not yet approved or cleared by the [place] FDA and has been authorized for detection and/or diagnosis of SARS-CoV-2  by FDA under an Emergency Use Authorization (EUA). This EUA will remain in effect (meaning this test can be used) for the duration of the COVID-19 declaration under Section 564(b)(1) of the Act, 21 U.S.C. section 360bbb-3(b)(1), unless the authorization is terminated or revoked.  Performed at [facility], [address] [place], [place] [address]   Aerobic/Anaerobic Culture w Gram Stain (surgical/deep wound)     Status: None   Collection Time: 03/10/21  5:29 AM   Specimen: PATH Other; Body Fluid  Result Value Ref Range Status   Specimen Description   Final    ABDOMEN INTRA ABDOMINAL FLUID Performed at [facility], [address], [place], [place] [address]    Special Requests   Final    NONE Performed at [facility], [place], [place] [address]    Gram Stain NO WBC SEEN NO ORGANISMS SEEN    Final   Culture   Final    No growth aerobically or anaerobically. Performed at [facility], [place] [address], [place], [place] [address]    Report Status 03/15/2021 FINAL  Final  Resp Panel by RT-PCR (Flu A&B, Covid) Nasopharyngeal Swab     Status: None   Collection Time: 03/15/21  6:08 AM   Specimen: Nasopharyngeal Swab; Nasopharyngeal(NP) swabs in vial transport medium  Result Value Ref Range Status   SARS Coronavirus 2 by RT PCR NEGATIVE NEGATIVE Final    Comment: (NOTE) SARS-CoV-2 target nucleic acids are NOT DETECTED.  The SARS-CoV-2 RNA is generally detectable in upper respiratory specimens during the acute phase of infection. The lowest concentration of SARS-CoV-2 viral copies this assay can detect is 138 copies/mL. A negative result does not preclude SARS-Cov-2 infection and should not be used as the sole basis for treatment or other patient management decisions. A negative result may occur with  improper specimen collection/handling, submission of specimen other than nasopharyngeal swab, presence of viral mutation(s) within the areas targeted by this assay, and inadequate number of viral copies(<138 copies/mL). A negative result must be combined with clinical observations, patient history, and epidemiological information. The expected result is Negative.  Fact Sheet for Patients:  EntrepreneurPulse.com.au  Fact Sheet for Healthcare Providers:  IncredibleEmployment.be  This test is no t yet approved or cleared by the [place] FDA and  has been authorized for detection and/or diagnosis of SARS-CoV-2 by FDA under an Emergency Use Authorization (EUA). This EUA will remain  in effect (meaning this test can be used) for the duration of the COVID-19 declaration under Section 564(b)(1) of the Act, 21 U.S.C.section 360bbb-3(b)(1), unless the authorization is terminated  or revoked sooner.       Influenza A by PCR NEGATIVE  NEGATIVE Final   Influenza B by PCR NEGATIVE NEGATIVE Final    Comment: (NOTE) The Xpert Xpress SARS-CoV-2/FLU/RSV plus assay is intended as an aid in the diagnosis of influenza from Nasopharyngeal swab specimens and should not be used as a sole basis for treatment. Nasal washings and aspirates are unacceptable for Xpert Xpress SARS-CoV-2/FLU/RSV testing.  Fact Sheet for Patients: EntrepreneurPulse.com.au  Fact Sheet for Healthcare Providers: IncredibleEmployment.be  This test is not yet approved or cleared by the [place] FDA and has been authorized for detection and/or diagnosis of SARS-CoV-2 by FDA under an Emergency Use Authorization (EUA). This EUA will remain in effect (meaning this test can be used) for the duration of the COVID-19 declaration under Section 564(b)(1) of the Act, 21 U.S.C. section 360bbb-3(b)(1), unless the  authorization is terminated or revoked.  Performed at [facility], [place], [place] [address]   C Difficile Quick Screen w PCR reflex     Status: None   Collection Time: 03/16/21  7:45 AM   Specimen: STOOL  Result Value Ref Range Status   C Diff antigen NEGATIVE NEGATIVE Final   C Diff toxin NEGATIVE NEGATIVE Final   C Diff interpretation No C. difficile detected.  Final    Comment: Performed at [facility], [address] [place], [place] [address]         Radiology Studies: No results found.      Scheduled Meds: . [MAR Hold] amitriptyline  10 mg Oral QHS  . [MAR Hold] calcium carbonate  1,250 mg Oral Q breakfast  . [MAR Hold] cholecalciferol  1,000 Units Oral Daily  . [MAR Hold] estradiol  1 Applicatorful Vaginal Once per day on Mon Wed Fri  . [MAR Hold] folic acid  1 mg Oral Daily  . [MAR Hold] mirtazapine  30 mg Oral QHS  . [MAR Hold] pantoprazole  40 mg Oral Daily  . [MAR Hold] sodium chloride flush  3 mL Intravenous Q12H  . [MAR Hold] SUMAtriptan  50  mg Oral UD  . [MAR Hold] cyanocobalamin  1,000 mcg Oral Daily   Continuous Infusions: . [MAR Hold] sodium chloride 1,000 mL (03/17/21 1301)    Assessment & Plan:   Principal Problem:   Rectal bleeding Active Problems:   Anemia, unspecified   Hypertension   GERD (gastroesophageal reflux disease)   Colonic mass   Pulmonary embolism (HCC)   Rectal bleeding  Patient presents to the ER for evaluation of several episodes of rectal bleeding consisting of bright red blood. 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  Returns and found with CT scan of abdomen and pelvis shows a bulky mass in the ascending colon compatible with carcinoma which may explain patient's rectal bleeding.  4/19-GI and surgery consulted. Coumadin on hold S/p colonoscopy on 4/20- malignant partially obstructing tumor in the distal ascending colon potential for elective colectomy Thursday versus Friday with Dr. Hampton Abbot or palliative care/hospice approach depending on patient's wishes H&H stable continue to monitor Transfuse if hemoglobin less than 7 I have requested palliative care consult  Hypertension Hold BP meds as patient's blood pressure is stable   History of pulmonary embolism on chronic anticoagulation therapy Coumadin on hold due to rectal bleeding Pharmacy managing Coumadin  Hiatal hernia with GERD Continue Protonix   DVT prophylaxis: SCD Code Status: DNR Family Communication: None  Status is: Observation  The patient remains OBS appropriate and will d/c before 2 midnights.  Dispo: The patient is from: Home              Anticipated d/c is to: Home              Patient currently is not medically stable to d/c.   Difficult to place patient No      LOS: 1 day   Time spent: 35 minutes with more than 50% on COC   Max Sane, MD [facility] Pager [phone]  If 7PM-7AM, please contact  night-coverage 03/17/2021, 2:30 PM

## 2021-03-17 NOTE — Progress Notes (Signed)
Palliative:  Consult received. Unable to see patient d/t colonoscopy. PMT provider will f/u 4/21.  Juel Burrow, DNP, AGNP-C Palliative Medicine Team Team Phone # [phone]  Pager # [phone]  NO CHARGE

## 2021-03-18 ENCOUNTER — Inpatient Hospital Stay: Payer: Medicare Other | Admitting: Anesthesiology

## 2021-03-18 ENCOUNTER — Encounter: Payer: Self-pay | Admitting: Gastroenterology

## 2021-03-18 ENCOUNTER — Encounter
Admission: EM | Disposition: A | Discharge status: Home Health | Payer: Self-pay | Source: Home / Self Care | Attending: Internal Medicine

## 2021-03-18 ENCOUNTER — Other Ambulatory Visit: Payer: Self-pay | Admitting: Pathology

## 2021-03-18 DIAGNOSIS — Z66 Do not resuscitate: Secondary | ICD-10-CM

## 2021-03-18 DIAGNOSIS — C188 Malignant neoplasm of overlapping sites of colon: Secondary | ICD-10-CM

## 2021-03-18 DIAGNOSIS — Z7189 Other specified counseling: Secondary | ICD-10-CM

## 2021-03-18 DIAGNOSIS — C218 Malignant neoplasm of overlapping sites of rectum, anus and anal canal: Secondary | ICD-10-CM

## 2021-03-18 DIAGNOSIS — Z515 Encounter for palliative care: Secondary | ICD-10-CM

## 2021-03-18 DIAGNOSIS — I2699 Other pulmonary embolism without acute cor pulmonale: Secondary | ICD-10-CM

## 2021-03-18 HISTORY — PX: COLOSTOMY REVISION: SHX5232

## 2021-03-18 LAB — CBC
HCT: 22.8 % — ABNORMAL LOW (ref 36.0–46.0)
Hemoglobin: 7.2 g/dL — ABNORMAL LOW (ref 12.0–15.0)
MCH: 27.8 pg (ref 26.0–34.0)
MCHC: 31.6 g/dL (ref 30.0–36.0)
MCV: 88 fL (ref 80.0–100.0)
Platelets: 395 10*3/uL (ref 150–400)
RBC: 2.59 MIL/uL — ABNORMAL LOW (ref 3.87–5.11)
RDW: 17.7 % — ABNORMAL HIGH (ref 11.5–15.5)
WBC: 6.1 10*3/uL (ref 4.0–10.5)
nRBC: 0 % (ref 0.0–0.2)

## 2021-03-18 LAB — BASIC METABOLIC PANEL
Anion gap: 5 (ref 5–15)
BUN: 7 mg/dL — ABNORMAL LOW (ref 8–23)
CO2: 29 mmol/L (ref 22–32)
Calcium: 8.6 mg/dL — ABNORMAL LOW (ref 8.9–10.3)
Chloride: 100 mmol/L (ref 98–111)
Creatinine, Ser: 0.71 mg/dL (ref 0.44–1.00)
GFR, Estimated: >60 mL/min (ref >60)
Glucose, Bld: 92 mg/dL (ref 70–99)
Potassium: 3.8 mmol/L (ref 3.5–5.1)
Sodium: 134 mmol/L — ABNORMAL LOW (ref 135–145)

## 2021-03-18 LAB — PROTIME-INR
INR: 1.2 (ref 0.8–1.2)
Prothrombin Time: 15.4 seconds — ABNORMAL HIGH (ref 11.4–15.2)

## 2021-03-18 LAB — SURGICAL PATHOLOGY

## 2021-03-18 LAB — SURGICAL PCR SCREEN
MRSA, PCR: NEGATIVE
Staphylococcus aureus: NEGATIVE

## 2021-03-18 SURGERY — COLECTOMY, RIGHT
Anesthesia: General | Laterality: Right

## 2021-03-18 MED ORDER — MORPHINE SULFATE (PF) 4 MG/ML IV SOLN: 2.0000 mg | INTRAVENOUS | Status: DC | PRN

## 2021-03-18 MED ORDER — BUPIVACAINE LIPOSOME 1.3 % IJ SUSP
INTRAMUSCULAR | Status: DC | PRN
  Administered 2021-03-18: 20 mL

## 2021-03-18 MED ORDER — PROCHLORPERAZINE EDISYLATE 10 MG/2ML IJ SOLN
10.0000 mg | Freq: Once | INTRAMUSCULAR | Status: AC
  Administered 2021-03-19 (×2): 10 mg via INTRAVENOUS
  Filled 2021-03-18 (×2): qty 2

## 2021-03-18 MED ORDER — SUGAMMADEX SODIUM 200 MG/2ML IV SOLN
INTRAVENOUS | Status: DC | PRN
  Administered 2021-03-18: 100 mg via INTRAVENOUS

## 2021-03-18 MED ORDER — LIDOCAINE HCL (CARDIAC) PF 100 MG/5ML IV SOSY
PREFILLED_SYRINGE | INTRAVENOUS | Status: DC | PRN
  Administered 2021-03-18: 60 mg via INTRAVENOUS

## 2021-03-18 MED ORDER — BUPIVACAINE-EPINEPHRINE (PF) 0.25% -1:200000 IJ SOLN
INTRAMUSCULAR | Status: AC
  Filled 2021-03-18: qty 30

## 2021-03-18 MED ORDER — BUPIVACAINE LIPOSOME 1.3 % IJ SUSP
INTRAMUSCULAR | Status: AC
  Filled 2021-03-18: qty 20

## 2021-03-18 MED ORDER — SODIUM CHLORIDE 0.9 % IV SOLN
INTRAVENOUS | Status: DC | PRN
  Administered 2021-03-18: 25 ug/min via INTRAVENOUS

## 2021-03-18 MED ORDER — ROCURONIUM BROMIDE 100 MG/10ML IV SOLN
INTRAVENOUS | Status: DC | PRN
  Administered 2021-03-18: 20 mg via INTRAVENOUS
  Administered 2021-03-18: 30 mg via INTRAVENOUS
  Administered 2021-03-18: 40 mg via INTRAVENOUS

## 2021-03-18 MED ORDER — ONDANSETRON HCL 4 MG/2ML IJ SOLN
4.0000 mg | Freq: Once | INTRAMUSCULAR | Status: AC | PRN
  Administered 2021-03-18: 4 mg via INTRAVENOUS

## 2021-03-18 MED ORDER — PROPOFOL 10 MG/ML IV BOLUS
INTRAVENOUS | Status: DC | PRN
  Administered 2021-03-18: 60 mg via INTRAVENOUS

## 2021-03-18 MED ORDER — BUPIVACAINE-EPINEPHRINE (PF) 0.25% -1:200000 IJ SOLN
INTRAMUSCULAR | Status: DC | PRN
  Administered 2021-03-18: 30 mL

## 2021-03-18 MED ORDER — FENTANYL CITRATE (PF) 100 MCG/2ML IJ SOLN
INTRAMUSCULAR | Status: AC
  Filled 2021-03-18: qty 2

## 2021-03-18 MED ORDER — MORPHINE SULFATE (PF) 2 MG/ML IV SOLN
1.0000 mg | INTRAVENOUS | Status: DC | PRN
  Administered 2021-03-19 (×3): 2 mg via INTRAVENOUS
  Filled 2021-03-18 (×3): qty 1

## 2021-03-18 MED ORDER — ACETAMINOPHEN 10 MG/ML IV SOLN
INTRAVENOUS | Status: DC | PRN
  Administered 2021-03-18: 600 mg via INTRAVENOUS

## 2021-03-18 MED ORDER — DEXAMETHASONE SODIUM PHOSPHATE 10 MG/ML IJ SOLN
INTRAMUSCULAR | Status: DC | PRN
  Administered 2021-03-18: 10 mg via INTRAVENOUS

## 2021-03-18 MED ORDER — SODIUM CHLORIDE 0.9 % IV SOLN
2.0000 g | Freq: Three times a day (TID) | INTRAVENOUS | Status: AC
  Administered 2021-03-19 (×3): 2 g via INTRAVENOUS
  Filled 2021-03-18 (×3): qty 2

## 2021-03-18 MED ORDER — LACTATED RINGERS IV SOLN: INTRAVENOUS | Status: DC | PRN

## 2021-03-18 MED ORDER — BIOTENE DRY MOUTH MT LIQD: 15.0000 mL | OROMUCOSAL | Status: DC | PRN

## 2021-03-18 MED ORDER — ONDANSETRON HCL 4 MG/2ML IJ SOLN
INTRAMUSCULAR | Status: DC | PRN
  Administered 2021-03-18: 4 mg via INTRAVENOUS

## 2021-03-18 MED ORDER — MENTHOL 3 MG MT LOZG
1.0000 | LOZENGE | OROMUCOSAL | Status: DC | PRN
  Filled 2021-03-18: qty 9

## 2021-03-18 MED ORDER — SODIUM CHLORIDE 0.9 % IV SOLN
INTRAVENOUS | Status: AC
  Filled 2021-03-18: qty 2

## 2021-03-18 MED ORDER — KETOROLAC TROMETHAMINE 30 MG/ML IJ SOLN: 15.0000 mg | Freq: Four times a day (QID) | INTRAMUSCULAR | Status: DC | PRN

## 2021-03-18 MED ORDER — PHENYLEPHRINE HCL (PRESSORS) 10 MG/ML IV SOLN
INTRAVENOUS | Status: DC | PRN
  Administered 2021-03-18 (×6): 100 ug via INTRAVENOUS
  Administered 2021-03-18: 200 ug via INTRAVENOUS
  Administered 2021-03-18: 100 ug via INTRAVENOUS

## 2021-03-18 MED ORDER — DICLOFENAC SODIUM 1 % EX GEL
4.0000 g | Freq: Four times a day (QID) | CUTANEOUS | Status: DC
  Administered 2021-03-19 – 2021-03-22 (×12): 4 g via TOPICAL
  Filled 2021-03-18: qty 100

## 2021-03-18 MED ORDER — OXYCODONE HCL 5 MG PO TABS
5.0000 mg | ORAL_TABLET | ORAL | Status: DC | PRN
  Administered 2021-03-19 – 2021-03-21 (×6): 5 mg via ORAL
  Filled 2021-03-18 (×6): qty 1

## 2021-03-18 MED ORDER — FENTANYL CITRATE (PF) 100 MCG/2ML IJ SOLN
INTRAMUSCULAR | Status: DC | PRN
  Administered 2021-03-18 (×3): 25 ug via INTRAVENOUS

## 2021-03-18 MED ORDER — ACETAMINOPHEN 325 MG PO TABS
650.0000 mg | ORAL_TABLET | Freq: Four times a day (QID) | ORAL | Status: DC | PRN
  Administered 2021-03-21 – 2021-03-23 (×5): 650 mg via ORAL
  Filled 2021-03-18 (×5): qty 2

## 2021-03-18 MED ORDER — ACETAMINOPHEN 10 MG/ML IV SOLN
INTRAVENOUS | Status: AC
  Filled 2021-03-18: qty 100

## 2021-03-18 MED ORDER — ONDANSETRON HCL 4 MG/2ML IJ SOLN
INTRAMUSCULAR | Status: AC
  Filled 2021-03-18: qty 2

## 2021-03-18 SURGICAL SUPPLY — 42 items
CHLORAPREP W/TINT 26 (MISCELLANEOUS) ×2 IMPLANT
COVER WAND RF STERILE (DRAPES) ×2 IMPLANT
DRAPE LAPAROTOMY 100X77 ABD (DRAPES) ×2 IMPLANT
DRAPE UNDER BUTTOCK W/FLU (DRAPES) ×2 IMPLANT
DRESSING PREVENA PLUS CUSTOM (GAUZE/BANDAGES/DRESSINGS) ×1 IMPLANT
DRSG PREVENA PLUS CUSTOM (GAUZE/BANDAGES/DRESSINGS) ×2
ELECT CAUTERY BLADE 6.4 (BLADE) ×2 IMPLANT
ELECT REM PT RETURN 9FT ADLT (ELECTROSURGICAL) ×2
ELECTRODE REM PT RTRN 9FT ADLT (ELECTROSURGICAL) ×1 IMPLANT
GAUZE SPONGE 4X4 12PLY STRL (GAUZE/BANDAGES/DRESSINGS) ×2 IMPLANT
GLOVE SURG SYN 7.0 (GLOVE) ×4 IMPLANT
GLOVE SURG SYN 7.5  E (GLOVE) ×2
GLOVE SURG SYN 7.5 E (GLOVE) ×2 IMPLANT
GOWN STRL REUS W/ TWL LRG LVL3 (GOWN DISPOSABLE) ×4 IMPLANT
GOWN STRL REUS W/TWL LRG LVL3 (GOWN DISPOSABLE) ×4
KIT TURNOVER KIT A (KITS) ×2 IMPLANT
LABEL OR SOLS (LABEL) ×2 IMPLANT
LIGASURE IMPACT 36 18CM CVD LR (INSTRUMENTS) ×2 IMPLANT
MANIFOLD NEPTUNE II (INSTRUMENTS) ×2 IMPLANT
NEEDLE HYPO 22GX1.5 SAFETY (NEEDLE) ×2 IMPLANT
NS IRRIG 1000ML POUR BTL (IV SOLUTION) ×2 IMPLANT
PACK BASIN MAJOR ARMC (MISCELLANEOUS) ×2 IMPLANT
PACK COLON CLEAN CLOSURE (MISCELLANEOUS) ×2 IMPLANT
RELOAD PROXIMATE 75MM BLUE (ENDOMECHANICALS) ×6 IMPLANT
REMOVER STAPLE SKIN (DISPOSABLE) ×2 IMPLANT
SEPRAFILM MEMBRANE 5X6 (MISCELLANEOUS) IMPLANT
SPONGE LAP 18X18 RF (DISPOSABLE) ×2 IMPLANT
STAPLER CIRCULAR MANUAL XL 29 (STAPLE) IMPLANT
STAPLER CVD CUT BL 40 RELOAD (ENDOMECHANICALS) IMPLANT
STAPLER PROXIMATE 75MM BLUE (STAPLE) ×2 IMPLANT
STAPLER SKIN PROX 35W (STAPLE) ×4 IMPLANT
STAPLER SYS INTERNAL RELOAD SS (MISCELLANEOUS) IMPLANT
SUT MNCRL 3-0 UNDYED SH (SUTURE) ×1 IMPLANT
SUT MONOCRYL 3-0 UNDYED (SUTURE) ×1
SUT PDS AB 1 CT1 27 (SUTURE) ×4 IMPLANT
SUT SILK 2-0 (SUTURE) ×2 IMPLANT
SUT SILK 3-0 (SUTURE) ×8 IMPLANT
SUT VIC AB 1 CTX 27 (SUTURE) ×2 IMPLANT
SUT VIC AB 3-0 SH 27 (SUTURE) ×1
SUT VIC AB 3-0 SH 27X BRD (SUTURE) ×1 IMPLANT
SYR 10ML LL (SYRINGE) ×2 IMPLANT
TRAY FOLEY MTR SLVR 16FR STAT (SET/KITS/TRAYS/PACK) ×2 IMPLANT

## 2021-03-18 NOTE — Anesthesia Postprocedure Evaluation (Signed)
Anesthesia Post Note  Patient: Kristina Rowe  Procedure(s) Performed: COLON RESECTION RIGHT (Right )  Patient location during evaluation: PACU Anesthesia Type: General Level of consciousness: awake and alert Pain management: pain level controlled Vital Signs Assessment: post-procedure vital signs reviewed and stable Respiratory status: spontaneous breathing, nonlabored ventilation and respiratory function stable Cardiovascular status: blood pressure returned to baseline and stable Postop Assessment: no apparent nausea or vomiting Anesthetic complications: no   No complications documented.   Last Vitals:  Vitals:   03/18/21 2140 03/18/21 2153  BP: (!) 111/55 (!) 107/57  Pulse: 94 93  Resp: (!) 21 18  Temp:  36.6 C  SpO2: 92% 98%    Last Pain:  Vitals:   03/18/21 2153  TempSrc: Oral  PainSc:                  Tera Mater

## 2021-03-18 NOTE — Progress Notes (Addendum)
PROGRESS NOTE    Kristina Rowe  ZTI:[id number] DOB: 1930/08/06 DOA: 03/15/2021 PCP: Haydee Salter, MD    Brief Narrative:  Kristina Rowe is a 87 y.o. 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 She states that prior to leaving the hospital that she had loose stools containing blood and was told this was expected following surgery. Since her discharge home she has had 3 episodes of stool containing bright red blood and states that she has fecal incontinence.  This concerned her and prompted her return to the emergency room. She has abdominal discomfort in the right lower quadrant but denies having any pain. She complains of feeling dizzy and lightheaded but has not had any falls.  Consultants:   General surgery, GI  Procedures: CT of abdomen and pelvis: 1. Bulky ascending colon mass compatible with carcinoma. Possible ileocolic adenopathy and extra serosal extension. 2. No recurrent volvulus. 3. Left groin hernia, potentially femoral. 4. Numerous chronic findings are described above.     Subjective: Very appreciative for her care.  Sitting in the chair.  Waiting for surgery today.  Reports some stomach upset with antibiotics which is started for preop  Objective: Vitals:   03/17/21 2350 03/18/21 0427 03/18/21 1007 03/18/21 1118  BP: 109/66 101/64 99/60 97/61   Pulse: 92 86 82 86  Resp: 16 18 18 18   Temp: 98.4 F (36.9 C) 97.6 F (36.4 C)  97.9 F (36.6 C)  TempSrc: Oral Oral  Oral  SpO2: 95% 94% 95% 96%  Weight:      Height:        Intake/Output Summary (Last 24 hours) at 03/18/2021 1130 Last data filed at 03/17/2021 1818 Gross per 24 hour  Intake 450 ml  Output --   Net 450 ml   Filed Weights   03/15/21 0449  Weight: 40.8 kg    Examination:  General exam: Appears calm and comfortable  Respiratory system: Clear to auscultation. Respiratory effort normal. Cardiovascular system: S1 & S2 heard, RRR. No JVD, murmurs, rubs, gallops or clicks.  Gastrointestinal system: Abdomen is mildly distended. soft and nontender. Normal bowel sounds heard. Central nervous system: Alert and oriented.  Grossly intact Extremities: No edema Skin: Warm dry Psychiatry: Judgement and insight appear normal. Mood & affect appropriate.     Data Reviewed: I have personally reviewed following labs and imaging studies  CBC: Recent Labs  Lab 03/13/21 1036 03/14/21 0459 03/15/21 0514 03/15/21 1834 03/16/21 0533 03/16/21 1746 03/17/21 0424 03/17/21 1644 03/18/21 0348  WBC 7.4 6.3 5.6  --   --   --   --   --  6.1  NEUTROABS  --   --  3.8  --   --   --   --   --   --   HGB 8.9* 7.9* 9.4*   < > 8.1* 8.7* 8.1* 7.5* 7.2*  HCT 28.4* 24.6* 29.8*   < > 25.6* 28.0* 24.9* 24.0* 22.8*  MCV 88.8 86.9 88.4  --   --   --   --   --  88.0  PLT 462* 391 498*  --   --   --   --   --  395   < > = values in this interval not displayed.   Basic Metabolic  Panel: Recent Labs  Lab 03/15/21 0514 03/16/21 0533 03/18/21 0348  NA 137 137 134*  K 3.9 3.9 3.8  CL 102 101 100  CO2 28 29 29   GLUCOSE 102* 88 92  BUN 9 11 7*  CREATININE 0.87 0.72 0.71  CALCIUM 9.2 8.9 8.6*   GFR: Estimated Creatinine Clearance: 31.3 mL/min (by C-G formula based on SCr of 0.71 mg/dL). Liver Function Tests: Recent Labs  Lab 03/15/21 0514  AST 26  ALT 14  ALKPHOS 66  BILITOT 0.5  PROT 6.6  ALBUMIN 3.2*   Recent Labs  Lab 03/15/21 0514  LIPASE 42   No results for input(s): AMMONIA in the last 168 hours. Coagulation Profile: Recent Labs  Lab 03/13/21 1036 03/14/21 0459 03/15/21 0514 03/17/21 0424 03/18/21 0348  INR 1.5* 2.2* 2.2* 1.2 1.2   Cardiac Enzymes: No results for input(s):  CKTOTAL, CKMB, CKMBINDEX, TROPONINI in the last 168 hours. BNP (last 3 results) No results for input(s): PROBNP in the last 8760 hours. HbA1C: No results for input(s): HGBA1C in the last 72 hours. CBG: No results for input(s): GLUCAP in the last 168 hours. Lipid Profile: No results for input(s): CHOL, HDL, LDLCALC, TRIG, CHOLHDL, LDLDIRECT in the last 72 hours. Thyroid Function Tests: No results for input(s): TSH, T4TOTAL, FREET4, T3FREE, THYROIDAB in the last 72 hours. Anemia Panel: No results for input(s): VITAMINB12, FOLATE, FERRITIN, TIBC, IRON, RETICCTPCT in the last 72 hours. Sepsis Labs: No results for input(s): PROCALCITON, LATICACIDVEN in the last 168 hours.  Recent Results (from the past 240 hour(s))  Resp Panel by RT-PCR (Flu A&B, Covid) Nasopharyngeal Swab     Status: None   Collection Time: 03/10/21  1:37 AM   Specimen: Nasopharyngeal Swab; Nasopharyngeal(NP) swabs in vial transport medium  Result Value Ref Range Status   SARS Coronavirus 2 by RT PCR NEGATIVE NEGATIVE Final    Comment: (NOTE) SARS-CoV-2 target nucleic acids are NOT DETECTED.  The SARS-CoV-2 RNA is generally detectable in upper respiratory specimens during the acute phase of infection. The lowest concentration of SARS-CoV-2 viral copies this assay can detect is 138 copies/mL. A negative result does not preclude SARS-Cov-2 infection and should not be used as the sole basis for treatment or other patient management decisions. A negative result may occur with  improper specimen collection/handling, submission of specimen other than nasopharyngeal swab, presence of viral mutation(s) within the areas targeted by this assay, and inadequate number of viral copies(<138 copies/mL). A negative result must be combined with clinical observations, patient history, and epidemiological information. The expected result is Negative.  Fact Sheet for Patients:  EntrepreneurPulse.com.au  Fact Sheet  for Healthcare Providers:  IncredibleEmployment.be  This test is no t yet approved or cleared by the [place] FDA and  has been authorized for detection and/or diagnosis of SARS-CoV-2 by FDA under an Emergency Use Authorization (EUA). This EUA will remain  in effect (meaning this test can be used) for the duration of the COVID-19 declaration under Section 564(b)(1) of the Act, 21 U.S.C.section 360bbb-3(b)(1), unless the authorization is terminated  or revoked sooner.       Influenza A by PCR NEGATIVE NEGATIVE Final   Influenza B by PCR NEGATIVE NEGATIVE Final    Comment: (NOTE) The Xpert Xpress SARS-CoV-2/FLU/RSV plus assay is intended as an aid in the diagnosis of influenza from Nasopharyngeal swab specimens and should not be used as a sole basis for treatment. Nasal washings and aspirates are unacceptable for Xpert Xpress SARS-CoV-2/FLU/RSV testing.  Fact Sheet  for Patients: EntrepreneurPulse.com.au  Fact Sheet for Healthcare Providers: IncredibleEmployment.be  This test is not yet approved or cleared by the [place] FDA and has been authorized for detection and/or diagnosis of SARS-CoV-2 by FDA under an Emergency Use Authorization (EUA). This EUA will remain in effect (meaning this test can be used) for the duration of the COVID-19 declaration under Section 564(b)(1) of the Act, 21 U.S.C. section 360bbb-3(b)(1), unless the authorization is terminated or revoked.  Performed at [facility], [address] [place], [place] [address]   Aerobic/Anaerobic Culture w Gram Stain (surgical/deep wound)     Status: None   Collection Time: 03/10/21  5:29 AM   Specimen: PATH Other; Body Fluid  Result Value Ref Range Status   Specimen Description   Final    ABDOMEN INTRA ABDOMINAL FLUID Performed at [facility], [address] [place], [place] [address]    Special Requests   Final     NONE Performed at [facility], [place], [place] [address]    Gram Stain NO WBC SEEN NO ORGANISMS SEEN   Final   Culture   Final    No growth aerobically or anaerobically. Performed at [facility], [place] [address] [place], [place] [address]    Report Status 03/15/2021 FINAL  Final  Resp Panel by RT-PCR (Flu A&B, Covid) Nasopharyngeal Swab     Status: None   Collection Time: 03/15/21  6:08 AM   Specimen: Nasopharyngeal Swab; Nasopharyngeal(NP) swabs in vial transport medium  Result Value Ref Range Status   SARS Coronavirus 2 by RT PCR NEGATIVE NEGATIVE Final    Comment: (NOTE) SARS-CoV-2 target nucleic acids are NOT DETECTED.  The SARS-CoV-2 RNA is generally detectable in upper respiratory specimens during the acute phase of infection. The lowest concentration of SARS-CoV-2 viral copies this assay can detect is 138 copies/mL. A negative result does not preclude SARS-Cov-2 infection and should not be used as the sole basis for treatment or other patient management decisions. A negative result may occur with  improper specimen collection/handling, submission of specimen other than nasopharyngeal swab, presence of viral mutation(s) within the areas targeted by this assay, and inadequate number of viral copies(<138 copies/mL). A negative result must be combined with clinical observations, patient history, and epidemiological information. The expected result is Negative.  Fact Sheet for Patients:  EntrepreneurPulse.com.au  Fact Sheet for Healthcare Providers:  IncredibleEmployment.be  This test is no t yet approved or cleared by the [place] FDA and  has been authorized for detection and/or diagnosis of SARS-CoV-2 by FDA under an Emergency Use Authorization (EUA). This EUA will remain  in effect (meaning this test can be used) for the duration of the COVID-19 declaration under Section 564(b)(1) of the Act,  21 U.S.C.section 360bbb-3(b)(1), unless the authorization is terminated  or revoked sooner.       Influenza A by PCR NEGATIVE NEGATIVE Final   Influenza B by PCR NEGATIVE NEGATIVE Final    Comment: (NOTE) The Xpert Xpress SARS-CoV-2/FLU/RSV plus assay is intended as an aid in the diagnosis of influenza from Nasopharyngeal swab specimens and should not be used as a sole basis for treatment. Nasal washings and aspirates are unacceptable for Xpert Xpress SARS-CoV-2/FLU/RSV testing.  Fact Sheet for Patients: EntrepreneurPulse.com.au  Fact Sheet for Healthcare Providers: IncredibleEmployment.be  This test is not yet approved or cleared by the [place] FDA and has been authorized for detection and/or diagnosis of SARS-CoV-2 by FDA under an Emergency  Use Authorization (EUA). This EUA will remain in effect (meaning this test can be used) for the duration of the COVID-19 declaration under Section 564(b)(1) of the Act, 21 U.S.C. section 360bbb-3(b)(1), unless the authorization is terminated or revoked.  Performed at [facility], [place], [place] [address]   C Difficile Quick Screen w PCR reflex     Status: None   Collection Time: 03/16/21  7:45 AM   Specimen: STOOL  Result Value Ref Range Status   C Diff antigen NEGATIVE NEGATIVE Final   C Diff toxin NEGATIVE NEGATIVE Final   C Diff interpretation No C. difficile detected.  Final    Comment: Performed at [facility], [address], [place], [place] [address]  Surgical pcr screen     Status: None   Collection Time: 03/17/21 11:59 PM   Specimen: Nasal Mucosa; Nasal Swab  Result Value Ref Range Status   MRSA, PCR NEGATIVE NEGATIVE Final   Staphylococcus aureus NEGATIVE NEGATIVE Final    Comment: (NOTE) The Xpert SA Assay (FDA approved for NASAL specimens in patients 35 years of age and older), is one component of a comprehensive surveillance program. It  is not intended to diagnose infection nor to guide or monitor treatment. Performed at [facility], [address], [place], [place] [address]          Radiology Studies: No results found.      Scheduled Meds: . amitriptyline  10 mg Oral QHS  . calcium carbonate  1,250 mg Oral Q breakfast  . cholecalciferol  1,000 Units Oral Daily  . estradiol  1 Applicatorful Vaginal Once per day on Mon Wed Fri  . folic acid  1 mg Oral Daily  . mirtazapine  30 mg Oral QHS  . pantoprazole  40 mg Oral Daily  . sodium chloride flush  3 mL Intravenous Q12H  . SUMAtriptan  50 mg Oral UD  . cyanocobalamin  1,000 mcg Oral Daily   Continuous Infusions: . sodium chloride 1,000 mL (03/17/21 1301)  . sodium chloride 50 mL/hr at 03/18/21 0607  . cefoTEtan (CEFOTAN) IV      Assessment & Plan:   Principal Problem:   Rectal bleeding Active Problems:   Anemia, unspecified   Hypertension   GERD (gastroesophageal reflux disease)   Colonic mass   Pulmonary embolism (HCC)   Rectal bleeding  Patient presents to the ER for evaluation of several episodes of rectal bleeding consisting of bright red blood. 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  Returns and found with CT scan of abdomen and pelvis shows a bulky mass in the ascending colon compatible with carcinoma which may explain patient's rectal bleeding.  Coumadin on hold S/p colonoscopy on 4/20- malignant partially obstructing tumor in the distal ascending colon -elective colectomy today on 4/21 with Dr. Hampton Abbot Hemoglobin 7.2 today, will transfuse if hemoglobin less than 7 I have requested palliative care consultfor goals of care discussion  Hypertension Hold BP meds as patient's blood pressure is stable   History of pulmonary embolism on chronic anticoagulation therapy Coumadin on hold due to rectal bleeding Pharmacy managing Coumadin, INR 1.2.   Considering low hemoglobin and bleeding concern I would hold off anticoagulation  Hiatal hernia with GERD Continue Protonix   DVT prophylaxis: SCD Code Status: DNR Family Communication: I have updated patient's daughter [name] over phone on 4/21  Status is: Inpatient  Remains inpatient appropriate because:Ongoing  diagnostic testing needed not appropriate for outpatient work up   Dispo: The patient is from: Home              Anticipated d/c is to: Home              Patient currently is not medically stable to d/c.   Difficult to place patient No     LOS: 2 days   Time spent: 35 minutes with more than 50% on COC   Max Sane, MD [facility] Pager [phone]  If 7PM-7AM, please contact night-coverage 03/18/2021, 11:30 AM

## 2021-03-18 NOTE — Anesthesia Preprocedure Evaluation (Signed)
Anesthesia Evaluation  Patient identified by MRN, date of birth, ID band Patient awake    Reviewed: Allergy & Precautions, H&P , NPO status , Patient's Chart, lab work & pertinent test results  History of Anesthesia Complications Negative for: history of anesthetic complications  Airway Mallampati: II  TM Distance: >3 FB     Dental  (+) Teeth Intact   Pulmonary neg pulmonary ROS, neg sleep apnea, neg COPD,    breath sounds clear to auscultation       Cardiovascular hypertension, (-) angina(-) Past MI and (-) Cardiac Stents (-) dysrhythmias  Rhythm:regular Rate:Normal + Systolic murmurs    Neuro/Psych negative neurological ROS  negative psych ROS   GI/Hepatic Neg liver ROS, hiatal hernia, GERD  ,-H/o esophageal strictures requiring dilation, with restricted diet  -H/o volvulus s/p ex lap one week ago, then presented to ED with rectal bleeding and found to have a mass on colonoscopy, now presenting for partial colonic resection   Endo/Other  negative endocrine ROS  Renal/GU      Musculoskeletal   Abdominal   Peds  Hematology  (+) Blood dyscrasia, anemia , Hgb 7.2 H/o PE on warfarin therapy.  INR 1.2   Anesthesia Other Findings Very thin, malnourished appearing  Past Medical History: No date: Ductal carcinoma in situ (DCIS) of left breast No date: Dysphagia No date: GERD (gastroesophageal reflux disease) No date: Hiatal hernia No date: Hypertension  No date: Iron deficiency anemia No date: Osteoporosis No date: Pulmonary embolism (HCC) No date: Scoliosis No date: UTI (urinary tract infection)  Past Surgical History: 2009: BREAST LUMPECTOMY; Left     Comment:  Ductal Carcinoma insitu  No date: CATARACT EXTRACTION, BILATERAL 03/17/2021: COLONOSCOPY; N/A     Comment:  Procedure: COLONOSCOPY;  Surgeon: Lesly Rubenstein,               MD;  Location: [facility] ENDOSCOPY;  Service: Endoscopy;                 Laterality: N/A; 2009: LAPAROSCOPIC PARAESOPHAGEAL HERNIA REPAIR 03/10/2021: LAPAROTOMY; N/A     Comment:  Procedure: EXPLORATORY LAPAROTOMY;  Surgeon: Olean Ree, MD;  Location: [facility] ORS;  Service: General;                Laterality: N/A; No date: TUBAL LIGATION  BMI    Body Mass Index: 17.00 kg/m      Reproductive/Obstetrics negative OB ROS                            Anesthesia Physical Anesthesia Plan  ASA: III  Anesthesia Plan: General ETT   Post-op Pain Management:    Induction:   PONV Risk Score and Plan: Ondansetron, Dexamethasone and Treatment may vary due to age or medical condition  Airway Management Planned:   Additional Equipment:   Intra-op Plan:   Post-operative Plan:   Informed Consent: I have reviewed the patients History and Physical, chart, labs and discussed the procedure including the risks, benefits and alternatives for the proposed anesthesia with the patient or authorized representative who has indicated his/her understanding and acceptance.     Dental Advisory Given  Plan Discussed with: Anesthesiologist, CRNA and Surgeon  Anesthesia Plan Comments:         Anesthesia Quick Evaluation

## 2021-03-18 NOTE — Progress Notes (Signed)
[facility] Day(s): 2.   Post op day(s): 1 Day Post-Op.   Interval History:  Patient seen and examined No acute events or new complaints overnight.  Patient reports she is very "run down" from the colonoscopy prep Still with some soreness in the RLQ  No fever, chills, nausea, emesis Hgb low, but somewhat stable, 7.2 Renal function normal, sCr - 0.71; UO - unmeasured CEA is pending  INR is 1.2 Plan today for right colectomy with Dr Hampton Abbot this afternoon  Vital signs in last 24 hours: [min-max] current  Temp:  [97.6 F (36.4 C)-99.8 F (37.7 C)] 97.6 F (36.4 C) (04/21 0427) Pulse Rate:  [83-94] 86 (04/21 0427) Resp:  [16-18] 18 (04/21 0427) BP: (99-119)/(60-78) 101/64 (04/21 0427) SpO2:  [94 %-100 %] 94 % (04/21 0427)     Height: 5\' 1"  (154.9 cm) Weight: 40.8 kg BMI (Calculated): 17   Intake/Output last 2 shifts:  04/20 0701 - 04/21 0700 In: 450 [P.O.:300; I.V.:150] Out: -    Physical Exam:  Constitutional: alert, cooperative and no distress  Respiratory: breathing non-labored at rest  Cardiovascular: regular rate and sinus rhythm  Gastrointestinal:Soft, remains point tender in the RLQ, non-distended, no rebound/guarding. Integumentary:Laparoscopic incision is CDI with staples, no erythema or drainage. Previous drain site covered with gauze.   Labs:  CBC Latest Ref Rng & Units 03/18/2021 03/17/2021 03/17/2021  WBC 4.0 - 10.5 K/uL 6.1 - -  Hemoglobin 12.0 - 15.0 g/dL 7.2(L) 7.5(L) 8.1(L)  Hematocrit 36.0 - 46.0 % 22.8(L) 24.0(L) 24.9(L)  Platelets 150 - 400 K/uL 395 - -   CMP Latest Ref Rng & Units 03/18/2021 03/16/2021 03/15/2021  Glucose 70 - 99 mg/dL 92 88 102(H)  BUN 8 - 23 mg/dL 7(L) 11 9  Creatinine 0.44 - 1.00 mg/dL 0.71 0.72 0.87  Sodium 135 - 145 mmol/L 134(L) 137 137  Potassium 3.5 - 5.1 mmol/L 3.8 3.9 3.9  Chloride 98 - 111 mmol/L 100 101 102  CO2 22 - 32 mmol/L 29 29 28   Calcium 8.9 - 10.3 mg/dL 8.6(L)  8.9 9.2  Total Protein 6.5 - 8.1 g/dL - - 6.6  Total Bilirubin 0.3 - 1.2 mg/dL - - 0.5  Alkaline Phos 38 - 126 U/L - - 66  AST 15 - 41 U/L - - 26  ALT 0 - 44 U/L - - 14    Imaging studies: No new pertinent imaging studies   Assessment/Plan:  87 y.o. female found to have ascending colon mass concerning for malignancy now8days s/pexploratory laparotomy and lysis of adhesionsfor mesenteric volvulus   - Tentative plan for right colectomy this afternoon with Dr Hampton Abbot pending OR/Anesthesia availability  - All risks, benefits, and alternatives to above procedure(s) were discussed with the patient, all of her questions were answered to her expressed satisfaction, patient expresses she wishes to proceed, and informed consent was obtained.   - NPO + IVF Resuscitation   - Monitor abdominal examination; on-going bowel function             - Monitor H&H; stable; transfuse as needed              - Pain control prn; antiemetics prn             - mobilization as tolerated             - Monitor INR   - Appreciate GI assistance; colonoscopy reviewed             -  Further management per primary service; we will follow along    All of the above findings and recommendations were discussed with the patient, and the medical team, and all of patient's questions were answered to her expressed satisfaction.  -- Edison Simon, PA-C [facility] 03/18/2021, 7:09 AM [phone] M-F: 7am - 4pm

## 2021-03-18 NOTE — Transfer of Care (Signed)
Immediate Anesthesia Transfer of Care Note  Patient: Kristina Rowe  Procedure(s) Performed: COLON RESECTION RIGHT (Right )  Patient Location: PACU  Anesthesia Type:General  Level of Consciousness: awake  Airway & Oxygen Therapy: Patient connected to face mask oxygen  Post-op Assessment: Post -op Vital signs reviewed and stable  Post vital signs: stable  Last Vitals:  Vitals Value Taken Time  BP 120/54 03/18/21 2046  Temp    Pulse 88 03/18/21 2049  Resp 16 03/18/21 2049  SpO2 100 % 03/18/21 2049  Vitals shown include unvalidated device data.  Last Pain:  Vitals:   03/18/21 1515  TempSrc: Temporal  PainSc: 0-No pain         Complications: No complications documented.

## 2021-03-18 NOTE — Op Note (Addendum)
Procedure Date:  03/18/2021  Pre-operative Diagnosis:  Right colon adenocarcinoma  Post-operative Diagnosis:  Right colon adenocarcinoma  Procedure:  Open Right Colectomy; placement of Bison vac <50 cm  Surgeon:  [name], MD  Assistant:  Edison Simon, PA-C; Signa Kell, PA-S  Anesthesia:  General endotracheal  Estimated Blood Loss:  50 ml  Specimens:  Right colon with terminal ileum  Complications:  None  Colon Resection Synoptic Operative Report  Operation performed with curative intent:Yes  Tumor Location:Ascending colon  Extent of colon and vascular resection:Right hemicolectomy - ileocolic, right colic present   Indications for Procedure:  This is a 87 y.o. female who presents with new diagnosis of right colon cancer.  She recently had an exploratory laparotomy with lysis of adhesions for small bowel volvulus about 1 week ago.  The benefits, complications, treatment options, and expected outcomes were discussed with the patient. The risks of bleeding, infection, bowel injury, and need for further procedures were all discussed with the patient and she was willing to proceed.  Description of Procedure: The patient was correctly identified in the preoperative area and brought into the operating room.  The patient was placed supine with VTE prophylaxis in place.  Appropriate time-outs were performed.  Anesthesia was induced and the patient was intubated.  Foley catheter was placed.  Appropriate antibiotics were infused.  The abdomen was prepped and draped in a sterile fashion. The patient's incision was reopened by removing the staples and cutting the sutures.  The abdomen did not have any significant adhesions yet.  The right colon was evaluated and a large mass proximal to the hepatic flexure was palpated.  There was ink from tattoo of the area during colonoscopy yesterday.  The bowel was otherwise healthy and there was no evidence of masses on the liver surface.   No evidence of volvulus anymore.  Proximal resection margin was identified in the terminal ileum and after creating a window in the mesentery, the terminal ileum was stapled using GIA blue load.  We started mobilizing the right colon along the white line of T[place]t including portion of the terminal ileum and the appendix.  We carried the dissection and mobilization of the colon to the hepatic flexure.  Then we started mobilizing the hepatic flexure off the attachments that it had to the liver.  A combination of LigaSure and blunt dissection was used for all this mobilization.  We were able to visualize the duodenum and prevent any injury.  We also identified the right ureter and preserved it.  We identified our distal resection margin, more than 5 cm distal to the mass, and after creating a window in the mesentery, the colon was stapled using GIA blue load.  LigaSure was then used to transect and cauterize across the mesentery from terminal ileum to proximal transverse colon.  The specimen was taken off the field and sent to pathology.  Then the blind ends of our staple lines were put together and lined up at the antimesenteric borders and secured in place using two 3-0 silk sutures.  Corners of the staple lines were cut using cautery and a GIA 75 mm blue load stapler was used to create a common channel between terminal ileum and transverse colon.  The same stapler was then used to close the opening of the common channel.  3-0 silk sutures were used to imbricate the staple line for protection.  Also a 3-0 silk suture was used to close the mesenteric defect.  We then inspected  the abdomen again and hemostasis was good.  The abdominal cavity was thoroughly irrigated with warm saline and suctioned clear.  We then scrubbed out and rescrubbed for our clean closure.  Exparel solution mixed with 0.25% bupivacaine with epi was infiltrated onto the peritoneum, fascia, and subcutaneous tissue.  The midline incision was closed  using #1 PDS suture x 2.  The deep dermis was approximated using 3-0 Vicryl, and the skin was closed using staples.  The skin was cleaned, and a Prevena Vac was placed and connected to suction machine.  The patient was emerged from anesthesia and extubated and brought to the recovery room for further management.   The patient tolerated the procedure well and all counts were correct at the end of the case.    [name], MD

## 2021-03-18 NOTE — Consult Note (Signed)
Consultation Note Date: 03/18/2021   Patient Name: Kristina Rowe  DOB: 1930/01/10  MRN: [id number]  Age / Sex: 87 y.o., female   PCP: Haydee Salter, MD Referring Physician: Max Sane, MD   REASON FOR CONSULTATION:Establishing goals of care  Palliative Care consult requested for goals of care discussion in this 87 y.o. 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  Patient presented to the ER from home with concerns of rectal bleeding.  She was recently discharged on 03/14/2021 postsurgery.  Since admission CT of abdomen and pelvis showed bulky ascending colon mass compatible with carcinoma.  Colonoscopy (4/20) showed malignant partially obstructing tumor in the distal ascending colon.  Patient is scheduled for elective colectomy today.  Clinical Assessment and Goals of Care: I have reviewed medical records including lab results, imaging, Epic notes, and MAR, received report from the bedside RN, and assessed the patient.   I met at the bedside with Mrs. Kristina Rowe and her daughter, Kristina Rowe to discuss diagnosis prognosis, GOC, EOL wishes, disposition and options.  [name] is awake, alert and oriented x3.  Denies pain or shortness of breath.  I introduced Palliative Medicine as specialized medical care for people living with serious illness. It focuses on providing relief from the symptoms and stress of a serious illness. The goal is to improve quality of life for both the patient and the family.  Patient and daughter appreciative of our support.  We discussed a brief life review of the patient, along with her functional and nutritional status.  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  She is widowed since December 2020.  She has 4 children and 4 grandsons who she loves dearly.  She enjoys  knitting, sewing, and jigsaw puzzles.  Prior to admission patient lived in a home alone with support from her children.  She was able to perform all ADLs independently with no assistive devices.  Actively driving.  She reports her appetite was good and she has always been a thin statured woman.  We discussed Her current illness and what it means in the larger context of Her on-going co-morbidities. Natural disease trajectory and expectations at EOL were discussed.  Ms. Kristina Rowe verbalized her understanding of her current illness and diagnosis.  She is remaining hopeful for improvement and is patiently waiting to undergo surgery.  A detailed discussion was had today regarding advanced directives.  Concepts specific to code status, artifical feeding and hydration, continued IV antibiotics and rehospitalization.   Patient acknowledges she does have a completed advanced directive and MOST form.  Her daughter Kristina Rowe is her primary medical decision maker with support from her other children.  Patient confirms wishes for DNR/DNI.  We discussed her current MOST form and designated wishes.  Patient states she is okay with IV fluids and antibiotics however would not want any sort of artificial feeding tubes.  Values and goals of care important to patient and family were attempted to be elicited.   [name] and her daughter are clear and expressed goals to continue to treat the treatable.  They are remaining hopeful for improvement and stability allowing her every opportunity to thrive.  She understands her diagnosis and wishes to proceed with any recommendations with hopes of ability of returning back to her previous baseline and independency.  She shares she  is open to rehabilitation at SNF or in the home depending on recommendations postsurgery.  She does express concerns for her generalized weakness since hospitalization.   I discussed the importance of continued conversation with family and their medical  providers regarding overall plan of care and treatment options, ensuring decisions are within the context of the patients values and GOCs.  Palliative Care services outpatient were explained and offered. Patient and family verbalized their understanding and awareness of both palliative and hospice's goals and philosophy of care.  Even patient's wishes to continue with medical interventions and treatment outpatient palliative support was recommended.   Questions and concerns were addressed.  Patient was encouraged to call with questions or concerns.  PMT will continue to support holistically as needed.   CODE STATUS: DNR  ADVANCE DIRECTIVES: Primary Decision Maker: Daughters SYMPTOM MANAGEMENT: Per attending  Palliative Prophylaxis:   Frequent Pain Assessment and Turn Reposition  PSYCHO-SOCIAL/SPIRITUAL:  Support System: Family  Desire for further Chaplaincy support: No  Additional Recommendations (Limitations, Scope, Preferences):  Full Scope Treatment, No Artificial Feeding and DNR/DNI, treat the treatable  Education on hospice/palliative    PAST MEDICAL HISTORY: Past Medical History:  Diagnosis Date  . Ductal carcinoma in situ (DCIS) of left breast   . Dysphagia   . GERD (gastroesophageal reflux disease)   . Hiatal hernia   . Hypertension   . Iron deficiency anemia   . Osteoporosis   . Pulmonary embolism ([place])   . Scoliosis   . UTI (urinary tract infection)     ALLERGIES:  is allergic to metronidazole, omeprazole, bactrim [sulfamethoxazole-trimethoprim], and codeine.   MEDICATIONS:  Current Facility-Administered Medications  Medication Dose Route Frequency Provider Last Rate Last Admin  . 0.9 %  sodium chloride infusion  250 mL Intravenous PRN Agbata, Tochukwu, MD 20 mL/hr at 03/17/21 1301 Restarted at 03/17/21 1302  . 0.9 %  sodium chloride infusion   Intravenous Continuous Dallie Piles, RPH 50 mL/hr at 03/18/21 9449 New Bag at 03/18/21 0607  . acetaminophen  (TYLENOL) tablet 500 mg  500 mg Oral QHS PRN Agbata, Tochukwu, MD   500 mg at 03/16/21 0010  . amitriptyline (ELAVIL) tablet 10 mg  10 mg Oral QHS Agbata, Tochukwu, MD   10 mg at 03/17/21 2111  . calcium carbonate (OS-CAL - dosed in mg of elemental calcium) tablet 1,250 mg  1,250 mg Oral Q breakfast Agbata, Tochukwu, MD   1,250 mg at 03/17/21 0901  . cefoTEtan (CEFOTAN) 2 g in sodium chloride 0.9 % 100 mL IVPB  2 g Intravenous On Call to [place], MD      . cholecalciferol (VITAMIN D) tablet 1,000 Units  1,000 Units Oral Daily Agbata, Tochukwu, MD   1,000 Units at 03/17/21 0900  . estradiol (ESTRACE) vaginal cream 1 Applicatorful  1 Applicatorful Vaginal Once per day on Mon Wed Fri Collier Bullock, MD   1 Applicatorful at 67/59/16 2112  . folic acid (FOLVITE) tablet 1 mg  1 mg Oral Daily Agbata, Tochukwu, MD   1 mg at 03/17/21 0900  . mirtazapine (REMERON) tablet 30 mg  30 mg Oral QHS Agbata, Tochukwu, MD   30 mg at 03/17/21 2113  . ondansetron (ZOFRAN) tablet 4 mg  4 mg Oral Q6H PRN Agbata, Tochukwu, MD       Or  . ondansetron (ZOFRAN) injection 4 mg  4 mg Intravenous Q6H PRN Agbata, Tochukwu, MD   4 mg at 03/18/21 0644  . pantoprazole (PROTONIX) EC tablet 40  mg  40 mg Oral Daily Agbata, Tochukwu, MD   40 mg at 03/17/21 0900  . sodium chloride flush (NS) 0.9 % injection 3 mL  3 mL Intravenous Q12H Agbata, Tochukwu, MD   3 mL at 03/17/21 2119  . sodium chloride flush (NS) 0.9 % injection 3 mL  3 mL Intravenous PRN Agbata, Tochukwu, MD      . SUMAtriptan (IMITREX) tablet 50 mg  50 mg Oral UD Agbata, Tochukwu, MD      . vitamin B-12 (CYANOCOBALAMIN) tablet 1,000 mcg  1,000 mcg Oral Daily Agbata, Tochukwu, MD   1,000 mcg at 03/17/21 0901    VITAL SIGNS: BP 97/61 (BP Location: Right Arm)   Pulse 86   Temp 97.9 F (36.6 C) (Oral)   Resp 18   Ht $R'5\' 1"'GP$  (1.549 m)   Wt 40.8 kg   SpO2 96%   BMI 17.00 kg/m  Filed Weights   03/15/21 0449  Weight: 40.8 kg    Estimated body mass index is  17 kg/m as calculated from the following:   Height as of this encounter: $RemoveBeforeD'5\' 1"'IPXptWyATMjtpf$  (1.549 m).   Weight as of this encounter: 40.8 kg.  LABS: CBC:    Component Value Date/Time   WBC 6.1 03/18/2021 0348   HGB 7.2 (L) 03/18/2021 0348   HCT 22.8 (L) 03/18/2021 0348   PLT 395 03/18/2021 0348   Comprehensive Metabolic Panel:    Component Value Date/Time   NA 134 (L) 03/18/2021 0348   K 3.8 03/18/2021 0348   BUN 7 (L) 03/18/2021 0348   CREATININE 0.71 03/18/2021 0348   ALBUMIN 3.2 (L) 03/15/2021 0514     Review of Systems  Neurological: Positive for weakness.  Unless otherwise noted, a complete review of systems is negative.  Physical Exam General: NAD Cardiovascular: regular rate and rhythm Pulmonary: diminished bilaterally  Abdomen: soft, tender, + bowel sounds Extremities: no edema, no joint deformities Skin: no rashes, warm and dry Neurological: AAOx3, mood appropriate   Prognosis: Unable to determine  Discharge Planning:  To Be Determined  Recommendations: . DNR/DNI-as confirmed by patient.  Patient's MOST form dictates wishes as discussed: DNR, limited interventions, IV fluids if indicated, antibiotics if indicated, no feeding tube . Continue with current plan of care, patient is clear in her expressed wishes to continue to treat the treatable.  She and family remains hopeful for improvement with her ability to return back to her baseline independency.  Goal of returning home but is open to SNF rehabilitation or in-home health if recommended. . Recommendations for outpatient palliative for continued support. Marland Kitchen PMT will continue to support and follow as needed. Please call team line with urgent needs.   Palliative Performance Scale: PPS 30-40%              Patient expressed understanding and was in agreement with this plan.   Thank you for allowing the Palliative Medicine Team to assist in the care of this patient. Please utilize secure chat with additional  questions, if there is no response within 30 minutes please call the above phone number.   Time In: 1115 Time Out: 1205 Time Total: 55 min.   Visit consisted of counseling and education dealing with the complex and emotionally intense issues of symptom management and palliative care in the setting of serious and potentially life-threatening illness.Greater than 50%  of this time was spent counseling and coordinating care related to the above assessment and plan.  Signed by:  Alda Lea, AGPCNP-BC Palliative  Medicine Team  Phone: [phone] Pager: [phone] Amion: [place] Team providers are available by phone from 7am to 7pm daily and can be reached through the team cell phone.  Should this patient require assistance outside of these hours, please call the patient's attending physician.

## 2021-03-18 NOTE — Anesthesia Procedure Notes (Signed)
Procedure Name: Intubation Date/Time: 03/18/2021 5:32 PM Performed by: Aline Brochure, CRNA Pre-anesthesia Checklist: Patient identified, Patient being monitored, Timeout performed, Emergency Drugs available and Suction available Patient Re-evaluated:Patient Re-evaluated prior to induction Oxygen Delivery Method: Circle system utilized Preoxygenation: Pre-oxygenation with 100% oxygen Induction Type: IV induction Ventilation: Mask ventilation without difficulty Laryngoscope Size: Mac and 3 Grade View: Grade I Tube type: Oral Tube size: 7.0 mm Number of attempts: 1 Airway Equipment and Method: Stylet Placement Confirmation: ETT inserted through vocal cords under direct vision,  positive ETCO2 and breath sounds checked- equal and bilateral Secured at: 20 cm Tube secured with: Tape Dental Injury: Teeth and Oropharynx as per pre-operative assessment

## 2021-03-19 ENCOUNTER — Other Ambulatory Visit: Payer: Self-pay

## 2021-03-19 ENCOUNTER — Encounter: Payer: Medicare Other | Admitting: Surgery

## 2021-03-19 ENCOUNTER — Encounter: Payer: Self-pay | Admitting: Surgery

## 2021-03-19 ENCOUNTER — Other Ambulatory Visit: Payer: Medicare Other

## 2021-03-19 DIAGNOSIS — N179 Acute kidney failure, unspecified: Secondary | ICD-10-CM

## 2021-03-19 DIAGNOSIS — C189 Malignant neoplasm of colon, unspecified: Secondary | ICD-10-CM

## 2021-03-19 LAB — CBC
HCT: 27.4 % — ABNORMAL LOW (ref 36.0–46.0)
Hemoglobin: 8.4 g/dL — ABNORMAL LOW (ref 12.0–15.0)
MCH: 27.7 pg (ref 26.0–34.0)
MCHC: 30.7 g/dL (ref 30.0–36.0)
MCV: 90.4 fL (ref 80.0–100.0)
Platelets: 438 10*3/uL — ABNORMAL HIGH (ref 150–400)
RBC: 3.03 MIL/uL — ABNORMAL LOW (ref 3.87–5.11)
RDW: 18.4 % — ABNORMAL HIGH (ref 11.5–15.5)
WBC: 15 10*3/uL — ABNORMAL HIGH (ref 4.0–10.5)
nRBC: 0 % (ref 0.0–0.2)

## 2021-03-19 LAB — BASIC METABOLIC PANEL
Anion gap: 13 (ref 5–15)
BUN: 13 mg/dL (ref 8–23)
CO2: 21 mmol/L — ABNORMAL LOW (ref 22–32)
Calcium: 8.3 mg/dL — ABNORMAL LOW (ref 8.9–10.3)
Chloride: 104 mmol/L (ref 98–111)
Creatinine, Ser: 1.13 mg/dL — ABNORMAL HIGH (ref 0.44–1.00)
GFR, Estimated: 47 mL/min — ABNORMAL LOW (ref >60)
Glucose, Bld: 81 mg/dL (ref 70–99)
Potassium: 4.3 mmol/L (ref 3.5–5.1)
Sodium: 138 mmol/L (ref 135–145)

## 2021-03-19 LAB — CEA: CEA: 4.3 ng/mL (ref 0.0–4.7)

## 2021-03-19 MED ORDER — BOOST / RESOURCE BREEZE PO LIQD CUSTOM
1.0000 | Freq: Three times a day (TID) | ORAL | Status: DC
  Administered 2021-03-19 – 2021-03-22 (×10): 1 via ORAL

## 2021-03-19 MED ORDER — HEPARIN SODIUM (PORCINE) 5000 UNIT/ML IJ SOLN
5000.0000 [IU] | Freq: Three times a day (TID) | INTRAMUSCULAR | Status: DC
  Administered 2021-03-19 – 2021-03-23 (×10): 5000 [IU] via SUBCUTANEOUS
  Filled 2021-03-19 (×11): qty 1

## 2021-03-19 NOTE — NC FL2 (Signed)
[place] LEVEL OF CARE SCREENING TOOL     IDENTIFICATION  Patient Name: Kristina Rowe Birthdate: 1930/04/23 Sex: female Admission Date (Current Location): 03/15/2021  [place] and [place] Number:  Engineering geologist and Address:  [facility], [address] [place], [place] [address]      Provider Number: [id number]  Attending Physician Name and Address:  Max Sane, MD  Relative Name and Phone Number:  Samuella, Rasool (Daughter)   [phone] [facility])    Current Level of Care: Hospital Recommended Level of Care: [place] Prior Approval Number:    Date Approved/Denied:   PASRR Number: [id number]  Discharge Plan:      Current Diagnoses: Patient Active Problem List   Diagnosis Date Noted  . Rectal bleeding 03/15/2021  . Hypertension   . GERD (gastroesophageal reflux disease)   . Colonic mass   . Pulmonary embolism ([place])   . 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[place]) 03/10/2021  . Anemia, unspecified 03/06/2021    Orientation RESPIRATION BLADDER Height & Weight     Self,Situation,Time,Place  Normal Continent,External catheter Weight: 89 lb 15.2 oz (40.8 kg) Height:  5\' 1"  (154.9 cm)  BEHAVIORAL SYMPTOMS/MOOD NEUROLOGICAL BOWEL NUTRITION STATUS      Continent Diet (clear liquids diet)  AMBULATORY STATUS COMMUNICATION OF NEEDS Skin   Limited Assist Verbally Wound Vac (pravena wound vac - abdomen)                       Personal Care Assistance Level of Assistance  Bathing,Feeding,Dressing Bathing Assistance: Limited assistance Feeding assistance: Independent Dressing Assistance: Limited assistance     Functional Limitations Info             SPECIAL CARE FACTORS FREQUENCY  PT (By licensed PT),OT (By licensed OT)     PT Frequency: 5 x/week OT Frequency: 5 x/week            Contractures      Additional Factors Info  Code Status,Allergies Code Status Info: DNR Allergies Info: metronidazole,  omeprazole, bactrim, codeine           Current Medications (03/19/2021):  This is the current hospital active medication list Current Facility-Administered Medications  Medication Dose Route Frequency Provider Last Rate Last Admin  . 0.9 %  sodium chloride infusion  250 mL Intravenous PRN Olean Ree, MD 20 mL/hr at 03/17/21 1301 Restarted at 03/17/21 1302  . 0.9 %  sodium chloride infusion   Intravenous Continuous Olean Ree, MD 75 mL/hr at 03/19/21 1357 Infusion Verify at 03/19/21 1357  . acetaminophen (TYLENOL) tablet 650 mg  650 mg Oral Q6H PRN Piscoya, Jose, MD      . amitriptyline (ELAVIL) tablet 10 mg  10 mg Oral QHS Piscoya, Jose, MD   10 mg at 03/17/21 2111  . antiseptic oral rinse (BIOTENE) solution 15 mL  15 mL Mouth Rinse PRN Sharion Settler, NP      . calcium carbonate (OS-CAL - dosed in mg of elemental calcium) tablet 1,250 mg  1,250 mg Oral Q breakfast Piscoya, Jose, MD   1,250 mg at 03/19/21 0913  . cefoTEtan (CEFOTAN) 2 g in sodium chloride 0.9 % 100 mL IVPB  2 g Intravenous Q8H Olean Ree, MD   Stopped at 03/19/21 [phone]  . cholecalciferol (VITAMIN D) tablet 1,000 Units  1,000 Units Oral Daily Olean Ree, MD   1,000 Units at 03/19/21 0913  . diclofenac Sodium (VOLTAREN) 1 % topical gel 4 g  4  g Topical QID Sharion Settler, NP   4 g at 03/19/21 1355  . estradiol (ESTRACE) vaginal cream 1 Applicatorful  1 Applicatorful Vaginal Once per day on Mon Wed Fri Piscoya, Jose, MD   1 Applicatorful at 82/50/53 2112  . feeding supplement (BOOST / RESOURCE BREEZE) liquid 1 Container  1 Container Oral TID BM Tylene Fantasia, PA-C   1 Container at 03/19/21 1355  . folic acid (FOLVITE) tablet 1 mg  1 mg Oral Daily Piscoya, Jose, MD   1 mg at 03/19/21 0913  . heparin injection 5,000 Units  5,000 Units Subcutaneous Q8H Piscoya, Jose, MD      . menthol-cetylpyridinium (CEPACOL) lozenge 3 mg  1 lozenge Oral PRN Sharion Settler, NP      . mirtazapine (REMERON) tablet 30 mg  30 mg  Oral QHS Olean Ree, MD   30 mg at 03/17/21 2113  . morphine 2 MG/ML injection 1-2 mg  1-2 mg Intravenous Q3H PRN Olean Ree, MD   2 mg at 03/19/21 1355  . ondansetron (ZOFRAN) tablet 4 mg  4 mg Oral Q6H PRN Piscoya, Jose, MD       Or  . ondansetron (ZOFRAN) injection 4 mg  4 mg Intravenous Q6H PRN Olean Ree, MD   4 mg at 03/18/21 0644  . oxyCODONE (Oxy IR/ROXICODONE) immediate release tablet 5 mg  5 mg Oral Q4H PRN Olean Ree, MD   5 mg at 03/19/21 0913  . pantoprazole (PROTONIX) EC tablet 40 mg  40 mg Oral Daily Piscoya, Jose, MD   40 mg at 03/19/21 0913  . sodium chloride flush (NS) 0.9 % injection 3 mL  3 mL Intravenous Q12H Piscoya, Jose, MD   3 mL at 03/19/21 0930  . sodium chloride flush (NS) 0.9 % injection 3 mL  3 mL Intravenous PRN Piscoya, Jose, MD      . SUMAtriptan (IMITREX) tablet 50 mg  50 mg Oral UD Piscoya, Jose, MD      . vitamin B-12 (CYANOCOBALAMIN) tablet 1,000 mcg  1,000 mcg Oral Daily Piscoya, Jose, MD   1,000 mcg at 03/19/21 9767     Discharge Medications: Please see discharge summary for a list of discharge medications.  Relevant Imaging Results:  Relevant Lab Results:   Additional Information SS #: [id number]  [place], LCSW

## 2021-03-19 NOTE — Care Management Important Message (Signed)
Important Message  Patient Details  Name: Kristina Rowe MRN: [id number] Date of Birth: 08-20-1930   Medicare Important Message Given:  Yes     Dannette Barbara 03/19/2021, 11:54 AM

## 2021-03-19 NOTE — Plan of Care (Signed)
Continuing with plan of care. 

## 2021-03-19 NOTE — TOC Initial Note (Signed)
Transition of Care [facility]) - Initial/Assessment Note    Patient Details  Name: Kristina Rowe MRN: [id number] Date of Birth: [birth year]  Transition of Care [facility]) CM/SW Contact:    Magnus Ivan, LCSW Phone Number: 03/19/2021, 4:15 PM  Clinical Narrative:                CSW spoke with patient. Patient reported she lives alone. PCP is Dr. Daryll Brod. Daughters will be providing transportation. Pharmacy is [facility] in [place]. No DME, HH, or SNF history. Patient has had both COVID vaccines and says she had 2 booster vaccines. CSW explained PT recommendation for SNF. Patient states she feels she does need SNF, her daughters would like to research options before she chooses a SNF. She was agreeable to CSW starting SNF work up and will follow up with patient/daughters with bed offers.   Expected Discharge Plan: Skilled Nursing Facility Barriers to Discharge: Continued Medical Work up   Patient Goals and CMS Choice Patient states their goals for this hospitalization and ongoing recovery are:: SNF rehab CMS Medicare.gov Compare Post Acute Care list provided to:: Patient Choice offered to / list presented to : Patient  Expected Discharge Plan and Services Expected Discharge Plan: [place] arrangements for the past 2 months: Single Family Home                                      Prior Living Arrangements/Services Living arrangements for the past 2 months: Single Family Home Lives with:: Self Patient language and need for interpreter reviewed:: Yes Do you feel safe going back to the place where you live?: Yes      Need for Family Participation in Patient Care: Yes (Comment) Care giver support system in place?: Yes (comment)   Criminal Activity/Legal Involvement Pertinent to Current Situation/Hospitalization: No - Comment as needed  Activities of Daily Living   ADL Screening (condition at time of admission) Patient's cognitive ability adequate to  safely complete daily activities?: Yes Is the patient deaf or have difficulty hearing?: No Does the patient have difficulty seeing, even when wearing glasses/contacts?: No Does the patient have difficulty concentrating, remembering, or making decisions?: No Patient able to express need for assistance with ADLs?: Yes Does the patient have difficulty dressing or bathing?: Yes Independently performs ADLs?: Yes (appropriate for developmental age) Does the patient have difficulty walking or climbing stairs?: Yes  Permission Sought/Granted Permission sought to share information with : Facility Retail banker granted to share information with : Yes, Verbal Permission Granted     Permission granted to share info w AGENCY: SNFs  Permission granted to share info w Relationship: all 3 daughters listed in chart     Emotional Assessment       Orientation: : Oriented to Self,Oriented to Place,Oriented to  Time,Oriented to Situation Alcohol / Substance Use: Not Applicable Psych Involvement: No (comment)  Admission diagnosis:  Rectal bleeding [K62.5] Patient Active Problem List   Diagnosis Date Noted  . Rectal bleeding 03/15/2021  . Hypertension   . GERD (gastroesophageal reflux disease)   . Colonic mass   . Pulmonary embolism ([place])   . 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[place]) 03/10/2021  . Anemia, unspecified 03/06/2021   PCP:  Haydee Salter, MD Pharmacy:   [facility] [place], [place] AT [facility] OF [address]  [address] [address] Phone: [phone] Fax: [phone]     Social Determinants of Health (SDOH) Interventions    Readmission Risk Interventions No flowsheet data found.

## 2021-03-19 NOTE — Progress Notes (Addendum)
PROGRESS NOTE    Kristina Rowe  [id number] DOB: 01/21/1930 DOA: 03/15/2021 PCP: Haydee Salter, MD    Brief Narrative:  Kristina Rowe is a 87 y.o. 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 She states that prior to leaving the hospital that she had loose stools containing blood and was told this was expected following surgery. Since her discharge home she has had 3 episodes of stool containing bright red blood and states that she has fecal incontinence.  This concerned her and prompted her return to the emergency room. She has abdominal discomfort in the right lower quadrant but denies having any pain. She complains of feeling dizzy and lightheaded but has not had any falls.  Consultants:   General surgery, GI  Procedures: CT of abdomen and pelvis: 1. Bulky ascending colon mass compatible with carcinoma. Possible ileocolic adenopathy and extra serosal extension. 2. No recurrent volvulus. 3. Left groin hernia, potentially femoral. 4. Numerous chronic findings are described above.     Subjective: Very appreciative for her care.  Sitting in the chair.  Waiting for surgery today.  Reports some stomach upset with antibiotics which is started for preop  Objective: Vitals:   03/19/21 0415 03/19/21 0419 03/19/21 1220 03/19/21 1221  BP:  (!) 106/51 100/67   Pulse: 94 94 89 91  Resp:  16 16   Temp:  (!) 97.4 F (36.3 C) 97.9 F (36.6 C)   TempSrc:  Oral Oral   SpO2: 99% 98%  100%  Weight:      Height:        Intake/Output Summary (Last 24 hours) at 03/19/2021 1317 Last data filed at 03/19/2021 1017 Gross per 24 hour  Intake 2816.47 ml  Output 100 ml  Net 2716.47 ml    Filed Weights   03/15/21 0449  Weight: 40.8 kg    Examination:  General exam: Appears calm and comfortable  Respiratory system: Clear to auscultation. Respiratory effort normal. Cardiovascular system: S1 & S2 heard, RRR. No JVD, murmurs, rubs, gallops or clicks.  Gastrointestinal system: Abdomen soft, has incisional soreness.  No rebound or guarding.  No organomegaly.  Laparotomy incision with Prevana in place with good seal Central nervous system: Alert and oriented.  Grossly intact Extremities: No edema Skin: Warm dry Psychiatry: Judgement and insight appear normal. Mood & affect appropriate.     Data Reviewed: I have personally reviewed following labs and imaging studies  CBC: Recent Labs  Lab 03/13/21 1036 03/14/21 0459 03/15/21 0514 03/15/21 1834 03/16/21 1746 03/17/21 0424 03/17/21 1644 03/18/21 0348 03/19/21 0442  WBC 7.4 6.3 5.6  --   --   --   --  6.1 15.0*  NEUTROABS  --   --  3.8  --   --   --   --   --   --   HGB 8.9* 7.9* 9.4*   < > 8.7* 8.1* 7.5* 7.2* 8.4*  HCT 28.4* 24.6* 29.8*   < > 28.0* 24.9* 24.0* 22.8* 27.4*  MCV 88.8 86.9 88.4  --   --   --   --  88.0 90.4  PLT 462* 391 498*  --   --   --   --  395 438*   < > = values in this interval  not displayed.   Basic Metabolic Panel: Recent Labs  Lab 03/15/21 0514 03/16/21 0533 03/18/21 0348 03/19/21 0442  NA 137 137 134* 138  K 3.9 3.9 3.8 4.3  CL 102 101 100 104  CO2 28 29 29  21*  GLUCOSE 102* 88 92 81  BUN 9 11 7* 13  CREATININE 0.87 0.72 0.71 1.13*  CALCIUM 9.2 8.9 8.6* 8.3*   GFR: Estimated Creatinine Clearance: 22.2 mL/min (A) (by C-G formula based on SCr of 1.13 mg/dL (H)). Liver Function Tests: Recent Labs  Lab 03/15/21 0514  AST 26  ALT 14  ALKPHOS 66  BILITOT 0.5  PROT 6.6  ALBUMIN 3.2*   Recent Labs  Lab 03/15/21 0514  LIPASE 42   No results for input(s): AMMONIA in the last 168 hours. Coagulation Profile: Recent Labs  Lab 03/13/21 1036 03/14/21 0459  03/15/21 0514 03/17/21 0424 03/18/21 0348  INR 1.5* 2.2* 2.2* 1.2 1.2     Recent Results (from the past 240 hour(s))  Resp Panel by RT-PCR (Flu A&B, Covid) Nasopharyngeal Swab     Status: None   Collection Time: 03/10/21  1:37 AM   Specimen: Nasopharyngeal Swab; Nasopharyngeal(NP) swabs in vial transport medium  Result Value Ref Range Status   SARS Coronavirus 2 by RT PCR NEGATIVE NEGATIVE Final    Comment: (NOTE) SARS-CoV-2 target nucleic acids are NOT DETECTED.  The SARS-CoV-2 RNA is generally detectable in upper respiratory specimens during the acute phase of infection. The lowest concentration of SARS-CoV-2 viral copies this assay can detect is 138 copies/mL. A negative result does not preclude SARS-Cov-2 infection and should not be used as the sole basis for treatment or other patient management decisions. A negative result may occur with  improper specimen collection/handling, submission of specimen other than nasopharyngeal swab, presence of viral mutation(s) within the areas targeted by this assay, and inadequate number of viral copies(<138 copies/mL). A negative result must be combined with clinical observations, patient history, and epidemiological information. The expected result is Negative.  Fact Sheet for Patients:  EntrepreneurPulse.com.au  Fact Sheet for Healthcare Providers:  IncredibleEmployment.be  This test is no t yet approved or cleared by the [place] FDA and  has been authorized for detection and/or diagnosis of SARS-CoV-2 by FDA under an Emergency Use Authorization (EUA). This EUA will remain  in effect (meaning this test can be used) for the duration of the COVID-19 declaration under Section 564(b)(1) of the Act, 21 U.S.C.section 360bbb-3(b)(1), unless the authorization is terminated  or revoked sooner.       Influenza A by PCR NEGATIVE NEGATIVE Final   Influenza B by PCR NEGATIVE NEGATIVE Final     Comment: (NOTE) The Xpert Xpress SARS-CoV-2/FLU/RSV plus assay is intended as an aid in the diagnosis of influenza from Nasopharyngeal swab specimens and should not be used as a sole basis for treatment. Nasal washings and aspirates are unacceptable for Xpert Xpress SARS-CoV-2/FLU/RSV testing.  Fact Sheet for Patients: EntrepreneurPulse.com.au  Fact Sheet for Healthcare Providers: IncredibleEmployment.be  This test is not yet approved or cleared by the [place] FDA and has been authorized for detection and/or diagnosis of SARS-CoV-2 by FDA under an Emergency Use Authorization (EUA). This EUA will remain in effect (meaning this test can be used) for the duration of the COVID-19 declaration under Section 564(b)(1) of the Act, 21 U.S.C. section 360bbb-3(b)(1), unless the authorization is terminated or revoked.  Performed at [facility], [address] [place], [place] [address]   Aerobic/Anaerobic Culture w  Gram Stain (surgical/deep wound)     Status: None   Collection Time: 03/10/21  5:29 AM   Specimen: PATH Other; Body Fluid  Result Value Ref Range Status   Specimen Description   Final    ABDOMEN INTRA ABDOMINAL FLUID Performed at [facility], [address], [place], [place] [address]    Special Requests   Final    NONE Performed at [facility], [place], [place] [address]    Gram Stain NO WBC SEEN NO ORGANISMS SEEN   Final   Culture   Final    No growth aerobically or anaerobically. Performed at [facility], [place] [address], [place], [place] [address]    Report Status 03/15/2021 FINAL  Final  Resp Panel by RT-PCR (Flu A&B, Covid) Nasopharyngeal Swab     Status: None   Collection Time: 03/15/21  6:08 AM   Specimen: Nasopharyngeal Swab; Nasopharyngeal(NP) swabs in vial transport medium  Result Value Ref Range Status   SARS Coronavirus 2 by RT PCR NEGATIVE NEGATIVE Final     Comment: (NOTE) SARS-CoV-2 target nucleic acids are NOT DETECTED.  The SARS-CoV-2 RNA is generally detectable in upper respiratory specimens during the acute phase of infection. The lowest concentration of SARS-CoV-2 viral copies this assay can detect is 138 copies/mL. A negative result does not preclude SARS-Cov-2 infection and should not be used as the sole basis for treatment or other patient management decisions. A negative result may occur with  improper specimen collection/handling, submission of specimen other than nasopharyngeal swab, presence of viral mutation(s) within the areas targeted by this assay, and inadequate number of viral copies(<138 copies/mL). A negative result must be combined with clinical observations, patient history, and epidemiological information. The expected result is Negative.  Fact Sheet for Patients:  EntrepreneurPulse.com.au  Fact Sheet for Healthcare Providers:  IncredibleEmployment.be  This test is no t yet approved or cleared by the [place] FDA and  has been authorized for detection and/or diagnosis of SARS-CoV-2 by FDA under an Emergency Use Authorization (EUA). This EUA will remain  in effect (meaning this test can be used) for the duration of the COVID-19 declaration under Section 564(b)(1) of the Act, 21 U.S.C.section 360bbb-3(b)(1), unless the authorization is terminated  or revoked sooner.       Influenza A by PCR NEGATIVE NEGATIVE Final   Influenza B by PCR NEGATIVE NEGATIVE Final    Comment: (NOTE) The Xpert Xpress SARS-CoV-2/FLU/RSV plus assay is intended as an aid in the diagnosis of influenza from Nasopharyngeal swab specimens and should not be used as a sole basis for treatment. Nasal washings and aspirates are unacceptable for Xpert Xpress SARS-CoV-2/FLU/RSV testing.  Fact Sheet for Patients: EntrepreneurPulse.com.au  Fact Sheet for Healthcare  Providers: IncredibleEmployment.be  This test is not yet approved or cleared by the [place] FDA and has been authorized for detection and/or diagnosis of SARS-CoV-2 by FDA under an Emergency Use Authorization (EUA). This EUA will remain in effect (meaning this test can be used) for the duration of the COVID-19 declaration under Section 564(b)(1) of the Act, 21 U.S.C. section 360bbb-3(b)(1), unless the authorization is terminated or revoked.  Performed at [facility], [place], [place] [address]   C Difficile Quick Screen w PCR reflex     Status: None   Collection Time: 03/16/21  7:45 AM   Specimen: STOOL  Result Value Ref Range Status   C Diff antigen NEGATIVE NEGATIVE Final   C  Diff toxin NEGATIVE NEGATIVE Final   C Diff interpretation No C. difficile detected.  Final    Comment: Performed at [facility], [address], [place], [place] [address]  Surgical pcr screen     Status: None   Collection Time: 03/17/21 11:59 PM   Specimen: Nasal Mucosa; Nasal Swab  Result Value Ref Range Status   MRSA, PCR NEGATIVE NEGATIVE Final   Staphylococcus aureus NEGATIVE NEGATIVE Final    Comment: (NOTE) The Xpert SA Assay (FDA approved for NASAL specimens in patients 25 years of age and older), is one component of a comprehensive surveillance program. It is not intended to diagnose infection nor to guide or monitor treatment. Performed at [facility], [address] [place], [place] [address]          Radiology Studies: No results found.      Scheduled Meds: . amitriptyline  10 mg Oral QHS  . calcium carbonate  1,250 mg Oral Q breakfast  . cholecalciferol  1,000 Units Oral Daily  . diclofenac Sodium  4 g Topical QID  . estradiol  1 Applicatorful Vaginal Once per day on Mon Wed Fri  . feeding supplement  1 Container Oral TID BM  . folic acid  1 mg Oral Daily  . heparin injection (subcutaneous)  5,000  Units Subcutaneous Q8H  . mirtazapine  30 mg Oral QHS  . pantoprazole  40 mg Oral Daily  . sodium chloride flush  3 mL Intravenous Q12H  . SUMAtriptan  50 mg Oral UD  . cyanocobalamin  1,000 mcg Oral Daily   Continuous Infusions: . sodium chloride 1,000 mL (03/17/21 1301)  . sodium chloride 75 mL/hr at 03/19/21 0914  . cefoTEtan (CEFOTAN) IV 2 g (03/19/21 [id number])    Assessment & Plan:   Principal Problem:   Rectal bleeding Active Problems:   Anemia, unspecified   Hypertension   GERD (gastroesophageal reflux disease)   Colonic mass   Pulmonary embolism (HCC)   Rectal bleeding  Patient presents to the ER for evaluation of several episodes of rectal bleeding consisting of bright red blood. 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  Returns and found with CT scan of abdomen and pelvis shows a bulky mass in the ascending colon compatible with carcinoma which may explain patient's rectal bleeding.  Coumadin on hold S/p colonoscopy on 4/20- malignant partially obstructing tumor in the distal ascending colon -Status post elective colectomy on 4/21 by Dr. Hampton Abbot  Adenocarcinoma of the ascending colon Status post right open colectomy on 4/21 -Surgery following.  Increased IV fluid this morning considering AKI.  Starting clear liquid diet today -DC Foley catheter -Dr. Rao/oncology is aware and is planning to see her at the cancer center as an outpatient for further work-up/management  AKI Likely poor p.o. intake, being n.p.o. for different procedures/surgery and bowel prep Increased IV fluid for now, avoid nephrotoxic medications  Hypertension Hold BP meds as patient's blood pressure is stable   History of pulmonary embolism on chronic anticoagulation therapy Coumadin on hold due to rectal bleeding Pharmacy managing Coumadin, INR 1.2.  Considering low hemoglobin and bleeding concern I would hold off  anticoagulation  Hiatal hernia with GERD Continue Protonix   DVT prophylaxis: SCD Code Status: DNR Family Communication: I have updated patient's daughter at bedside on 4/22  Status is: Inpatient  Remains inpatient appropriate because:Ongoing diagnostic testing needed not appropriate for outpatient work up  Dispo: The patient is from: Home              Anticipated d/c is to: Home              Patient currently is not medically stable to d/c.   Difficult to place patient No     LOS: 3 days   Time spent: 35 minutes with more than 50% on COC   Max Sane, MD [facility] Pager [phone]  If 7PM-7AM, please contact night-coverage 03/19/2021, 1:17 PM

## 2021-03-19 NOTE — Evaluation (Signed)
Physical Therapy Evaluation Patient Details Name: Kristina Rowe MRN: [id number] DOB: [birth year] Today's Date: 03/19/2021   History of Present Illness  presented to ER secondary to bright red blood in stools; admitted for management of rectal bleeding, symptomatic anemia.  Work up significant for partially-obstructive mass in ascending colon (suspicious for malignancy), s/p open R colectomy (4/21).  Clinical Impression  Patient supine in bed upon arrival to room, actively voicing desire to "have therapy come by soon".  Pleased to understand therapist's arrival and eager for [place] activities.  Endorses minimal pain in abdomen at this time; wound vac intact, clean and dry.  Bilat UE/LE strength and ROM grossly symmetrical and WFL; no focal weakness appreciated.  Significant thoracic kyphosis and scoliosis (curvature towards L) noted; unchanged from baseline for patient.  Currently requiring min assist for bed mobility; min/mod assist for sit/stand with RW; min assist for basic transfers and short-distance gait (34') with RW.  Demonstrates forward flexed posture; very short, cautious stepping pattern.  Requires RW and +1 for optimal safety at this time.  Limited activity tolerance; mild reports of dizziness towards end of gait trial.  Will plan to assess orthostatics next session if symptoms persist Would benefit from skilled PT to address above deficits and promote optimal return to PLOF.; recommend transition to STR upon discharge from acute hospitalization.  Will continue to monitor progress and update as appropriate.     Follow Up Recommendations SNF    Equipment Recommendations       Recommendations for Other Services       Precautions / Restrictions Precautions Precautions: Fall Precaution Comments: abdominal wound vac Restrictions Weight Bearing Restrictions: No      Mobility  Bed Mobility Overal bed mobility: Needs Assistance Bed Mobility: Supine to Sit     Supine to sit: Min  assist     General bed mobility comments: encouraged for log-rolling; min cuing for sequencing    Transfers Overall transfer level: Needs assistance Equipment used: Rolling walker (2 wheeled) Transfers: Sit to/from Stand Sit to Stand: Min assist;Mod assist         General transfer comment: cuing for hand placement; assist for lift off  Ambulation/Gait Ambulation/Gait assistance: Min assist Gait Distance (Feet): 35 Feet Assistive device: Rolling walker (2 wheeled)       General Gait Details: forward flexed posture; very short, cautious stepping pattern.  Requires RW and +1 for optimal safety at this time.  Limited activity tolerance; mild reports of dizziness towards end of gait trial.  Will plan to assess orthostatics next session if symptoms persist  Stairs            Wheelchair Mobility    Modified Rankin (Stroke Patients Only)       Balance Overall balance assessment: Needs assistance Sitting-balance support: No upper extremity supported;Feet supported Sitting balance-Leahy Scale: Good     Standing balance support: Bilateral upper extremity supported Standing balance-Leahy Scale: Fair                               Pertinent Vitals/Pain Pain Assessment: Faces Faces Pain Scale: Hurts a little bit Pain Location: abdomen Pain Descriptors / Indicators: Operative site guarding Pain Intervention(s): Limited activity within patient's tolerance;Monitored during session;Repositioned    Home Living Family/patient expects to be discharged to:: Private residence Living Arrangements: Alone Available Help at Discharge: Family;Available PRN/intermittently Type of Home: House Home Access: Stairs to enter Entrance Stairs-Rails: None Entrance Stairs-Number of  Steps: 1 Home Layout: One level Home Equipment: Cane - single point      Prior Function Level of Independence: Independent with assistive device(s)         Comments: previously used SPC  occasionally, however now uses SPC regulary in community and occasionally in home; denies fall history; + driving     Hand Dominance        Extremity/Trunk Assessment   Upper Extremity Assessment Upper Extremity Assessment: Overall WFL for tasks assessed    Lower Extremity Assessment Lower Extremity Assessment: Overall WFL for tasks assessed (grossly 4/5 throughout)    Cervical / Trunk Assessment Cervical / Trunk Assessment:  (very forward flexed, kyphotic posture; significant thoracic scoliosis with curvature towards L)  Communication   Communication: No difficulties  Cognition Arousal/Alertness: Awake/alert Behavior During Therapy: WFL for tasks assessed/performed Overall Cognitive Status: Within Functional Limits for tasks assessed                                        General Comments      Exercises Other Exercises Other Exercises: Bed/chair with RW, min assist; cuing for walker position/management Other Exercises: Educated in role of PT, progressive mobility; discussed current care needs and discharge options available.  currently requiring 24/7 hands-on assist with use of RW; patient/daughter voice understanding of needs.   Assessment/Plan    PT Assessment Patient needs continued PT services  PT Problem List Decreased strength;Decreased activity tolerance;Decreased balance;Decreased mobility;Decreased coordination;Decreased knowledge of use of DME;Decreased safety awareness;Decreased knowledge of precautions;Decreased skin integrity;Pain       PT Treatment Interventions DME instruction;Gait training;Functional mobility training;Therapeutic activities;Therapeutic exercise;Balance training;Stair training;Patient/family education    PT Goals (Current goals can be found in the Care Plan section)  Acute Rehab PT Goals Patient Stated Goal: to go home PT Goal Formulation: With patient Time For Goal Achievement: 04/02/21 Potential to Achieve Goals:  Good    Frequency Min 2X/week   Barriers to discharge Decreased caregiver support      Co-evaluation               AM-PAC PT "6 Clicks" Mobility  Outcome Measure Help needed turning from your back to your side while in a flat bed without using bedrails?: None Help needed moving from lying on your back to sitting on the side of a flat bed without using bedrails?: A Little Help needed moving to and from a bed to a chair (including a wheelchair)?: A Little Help needed standing up from a chair using your arms (e.g., wheelchair or bedside chair)?: A Little Help needed to walk in hospital room?: A Little Help needed climbing 3-5 steps with a railing? : A Little 6 Click Score: 19    End of Session   Activity Tolerance: Patient tolerated treatment well Patient left: in chair;with call bell/phone within reach;with family/visitor present Nurse Communication: Mobility status PT Visit Diagnosis: Difficulty in walking, not elsewhere classified (R26.2)    Time: 1021-1100 PT Time Calculation (min) (ACUTE ONLY): 39 min   Charges:   PT Evaluation $PT Eval Moderate Complexity: 1 Mod PT Treatments $Therapeutic Activity: 23-37 mins       Teal Bontrager H. Owens Shark, PT, DPT, NCS 03/19/21, 1:46 PM [phone]

## 2021-03-19 NOTE — Progress Notes (Signed)
[facility] Day(s): 3.   Post op day(s): 1 Day Post-Op.   Interval History:  Patient seen and examined No acute events or new complaints overnight.  Patient reports she is tired but overall doing well given extent of her course/surgery Incisional soreness No fever, chills, nausea, emesis She does have a leukocytosis morning to 15.0K; no fever, likely reactive from surgery Hgb is actually improved this morning to 8.4 She did have a slight bump in her sCr - 1.13; UO is likely under recorded she has 200+ ccs in catheter   No electrolyte derangements She has ben NPO since the procedure   Vital signs in last 24 hours: [min-max] current  Temp:  [97.3 F (36.3 C)-98.7 F (37.1 C)] 97.4 F (36.3 C) (04/22 0419) Pulse Rate:  [82-96] 94 (04/22 0419) Resp:  [16-21] 16 (04/22 0419) BP: (94-124)/(51-61) 106/51 (04/22 0419) SpO2:  [91 %-100 %] 98 % (04/22 0419)     Height: 5\' 1"  (154.9 cm) Weight: 40.8 kg BMI (Calculated): 17   Intake/Output last 2 shifts:  04/21 0701 - 04/22 0700 In: 2576.5 [P.O.:30; I.V.:2386.5; IV Piggyback:160] Out: 100 [Urine:50; Blood:50]   Physical Exam:  Constitutional: alert, cooperative and no distress  Respiratory: breathing non-labored at rest, on [place] Cardiovascular: regular rate and sinus rhythm  Gastrointestinal:Soft,incisional soreness, non-distended, no rebound/guarding Genitourinary: Foley catheter in place Integumentary:Laparotomy incision with Prevena in place, good seal, no output in canister    Labs:  CBC Latest Ref Rng & Units 03/19/2021 03/18/2021 03/17/2021  WBC 4.0 - 10.5 K/uL 15.0(H) 6.1 -  Hemoglobin 12.0 - 15.0 g/dL 8.4(L) 7.2(L) 7.5(L)  Hematocrit 36.0 - 46.0 % 27.4(L) 22.8(L) 24.0(L)  Platelets 150 - 400 K/uL 438(H) 395 -   CMP Latest Ref Rng & Units 03/19/2021 03/18/2021 03/16/2021  Glucose 70 - 99 mg/dL 81 92 88  BUN 8 - 23 mg/dL 13 7(L) 11  Creatinine 0.44 - 1.00 mg/dL 1.13(H) 0.71  0.72  Sodium 135 - 145 mmol/L 138 134(L) 137  Potassium 3.5 - 5.1 mmol/L 4.3 3.8 3.9  Chloride 98 - 111 mmol/L 104 100 101  CO2 22 - 32 mmol/L 21(L) 29 29  Calcium 8.9 - 10.3 mg/dL 8.3(L) 8.6(L) 8.9  Total Protein 6.5 - 8.1 g/dL - - -  Total Bilirubin 0.3 - 1.2 mg/dL - - -  Alkaline Phos 38 - 126 U/L - - -  AST 15 - 41 U/L - - -  ALT 0 - 44 U/L - - -     Imaging studies: No new pertinent imaging studies   Assessment/Plan:  87 y.o. female overall doing well, slight AKI, 1 Day Post-Op s/p open right colectomy for adenocarcinoma of the ascending colon   - Will initiate CLD + Boost Breeze   - Increase IVF to 75 ml/hr of NS given AKI; likely secondary to a combination of under resuscitation, bowel prep, surgery  - Will discontinue foley catheter today    - Continue Prevena Vac; typically x7 days  - Monitor abdominal examination; on-going bowel function  - Pain control prn; antiemetics prn  - Monitor leukocytosis; likely reactive from OR  - Monitor renal function  - Mobilization as tolerated; engage PT if needed  - Further management per primary service; we will follow   All of the above findings and recommendations were discussed with the patient, patient's family (Daughter at bedside), and the medical team, and all of patient's and family's questions were answered to their expressed satisfaction.  --  Edison Simon, PA-C [facility] 03/19/2021, 7:19 AM [phone] M-F: 7am - 4pm

## 2021-03-19 NOTE — Consult Note (Addendum)
[facility] for Warfarin Indication: History of PE  Patient Measurements: Height: 5\' 1"  (154.9 cm) Weight: 40.8 kg (89 lb 15.2 oz) IBW/kg (Calculated) : 47.8  Labs: Recent Labs    03/17/21 0424 03/17/21 1644 03/18/21 0348 03/19/21 0442  HGB 8.1* 7.5* 7.2* 8.4*  HCT 24.9* 24.0* 22.8* 27.4*  PLT  --   --  395 438*  LABPROT 15.1  --  15.4*  --   INR 1.2  --  1.2  --   CREATININE  --   --  0.71 1.13*    Estimated Creatinine Clearance: 22.2 mL/min (A) (by C-G formula based on SCr of 1.13 mg/dL (H)).   Medications:  Patient follows with Coumadin clinic at [facility]. Last seen 03/09/21. 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 Patient subsequently found to have adenocarcinoma of the ascending colon and underwent right open colectomy on 4/21. Pharmacy has been consulted for warfarin for history of PE.  Goal of Therapy:  INR 2-3 Monitor platelets by anticoagulation protocol: Yes   Plan:  --Discussed with surgery team, hold off on re-starting warfarin at this time. Will consider re-initiation on 4/23 or 4/24 if no bleeding. In the meantime, start DVT prophylaxis with SQ heparin --Plan to re-start warfarin once cleared by surgery team --Consideration could be given to bridging with therapeutic dose Lovenox once warfarin is re-started  Benita Gutter 03/19/2021,12:40 PM

## 2021-03-20 LAB — BASIC METABOLIC PANEL
Anion gap: 6 (ref 5–15)
BUN: 14 mg/dL (ref 8–23)
CO2: 26 mmol/L (ref 22–32)
Calcium: 8.4 mg/dL — ABNORMAL LOW (ref 8.9–10.3)
Chloride: 102 mmol/L (ref 98–111)
Creatinine, Ser: 0.91 mg/dL (ref 0.44–1.00)
GFR, Estimated: >60 mL/min (ref >60)
Glucose, Bld: 107 mg/dL — ABNORMAL HIGH (ref 70–99)
Potassium: 4.5 mmol/L (ref 3.5–5.1)
Sodium: 134 mmol/L — ABNORMAL LOW (ref 135–145)

## 2021-03-20 LAB — CBC
HCT: 27.1 % — ABNORMAL LOW (ref 36.0–46.0)
Hemoglobin: 8.5 g/dL — ABNORMAL LOW (ref 12.0–15.0)
MCH: 27.3 pg (ref 26.0–34.0)
MCHC: 31.4 g/dL (ref 30.0–36.0)
MCV: 87.1 fL (ref 80.0–100.0)
Platelets: 466 10*3/uL — ABNORMAL HIGH (ref 150–400)
RBC: 3.11 MIL/uL — ABNORMAL LOW (ref 3.87–5.11)
RDW: 18.5 % — ABNORMAL HIGH (ref 11.5–15.5)
WBC: 12.5 10*3/uL — ABNORMAL HIGH (ref 4.0–10.5)
nRBC: 0 % (ref 0.0–0.2)

## 2021-03-20 MED ORDER — GUAIFENESIN ER 600 MG PO TB12
600.0000 mg | ORAL_TABLET | Freq: Two times a day (BID) | ORAL | Status: DC
  Administered 2021-03-20 – 2021-03-23 (×7): 600 mg via ORAL
  Filled 2021-03-20 (×7): qty 1

## 2021-03-20 NOTE — Plan of Care (Signed)
Continuing with plan of care. 

## 2021-03-20 NOTE — Progress Notes (Signed)
03/20/2021  Subjective: Patient is 2 Days Post-Op s/p open right colectomy.  No acute events.  Patient reports she feels better today than yesterday.  Foley came out last night and she has voided.  No bowel function yet but she feels "rumbling"  Vital signs: Temp:  [97.6 F (36.4 C)-98 F (36.7 C)] 97.7 F (36.5 C) (04/23 0815) Pulse Rate:  [76-103] 103 (04/23 0815) Resp:  [17-19] 18 (04/23 0815) BP: (119-137)/(65-85) 124/76 (04/23 0815) SpO2:  [95 %-100 %] 97 % (04/23 0815)   Intake/Output: 04/22 0701 - 04/23 0700 In: 1970.8 [P.O.:420; I.V.:1350.8; IV Piggyback:200] Out: 450 [Urine:450] Last BM Date: 03/16/21  Physical Exam: Constitutional:  No acute distress Abdomen:  Soft, non-distended, appropriately tender to palpation.  Midline incision with prevena vac in place, good seal.    Labs:  Recent Labs    03/19/21 0442 03/20/21 0438  WBC 15.0* 12.5*  HGB 8.4* 8.5*  HCT 27.4* 27.1*  PLT 438* 466*   Recent Labs    03/19/21 0442 03/20/21 0438  NA 138 134*  K 4.3 4.5  CL 104 102  CO2 21* 26  GLUCOSE 81 107*  BUN 13 14  CREATININE 1.13* 0.91  CALCIUM 8.3* 8.4*   Recent Labs    03/18/21 0348  LABPROT 15.4*  INR 1.2    Imaging: No results found.  Assessment/Plan: This is a 87 y.o. female s/p open right colectomy.  --Patient is doing well, recovering slowly, which is expected given the two surgeries in two weeks.  Her WBC is coming down, Hgb is stable, and Cr is normalizing again.   --D/C IV fluid as she's starting to show some edema in her fingers. --Advance diet to full liquids. --Continue SQH.  I think will be ok to resume coumadin tomorrow.   Kristina Rowe, [facility]

## 2021-03-20 NOTE — Progress Notes (Signed)
PROGRESS NOTE    ELLANA KAWA  ZOX:[id number] DOB: 1930/07/01 DOA: 03/15/2021 PCP: Haydee Salter, MD    Brief Narrative:  Kristina Rowe is a 87 y.o. 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 She states that prior to leaving the hospital that she had loose stools containing blood and was told this was expected following surgery. Since her discharge home she has had 3 episodes of stool containing bright red blood and states that she has fecal incontinence.  This concerned her and prompted her return to the emergency room. She has abdominal discomfort in the right lower quadrant but denies having any pain. She complains of feeling dizzy and lightheaded but has not had any falls.  Consultants:   General surgery, GI  Procedures: CT of abdomen and pelvis: 1. Bulky ascending colon mass compatible with carcinoma. Possible ileocolic adenopathy and extra serosal extension. 2. No recurrent volvulus. 3. Left groin hernia, potentially femoral. 4. Numerous chronic findings are described above.     Subjective: Reports new cough but unable to expectorate.  Overall recovering well from surgery.  Agreeable with advancing diet if appropriate. Sitting in the chair.  Daughter at bedside.  Objective: Vitals:   03/19/21 1942 03/19/21 2325 03/20/21 0521 03/20/21 0815  BP: 133/75 137/85 119/65 124/76  Pulse: 76 77 79 (!) 103  Resp: 17 19 17 18   Temp: 98 F (36.7 C) 97.7 F (36.5 C) 97.6 F (36.4 C) 97.7 F (36.5 C)  TempSrc: Oral Oral Oral Oral  SpO2: 100% 98% 95% 97%  Weight:      Height:        Intake/Output Summary (Last 24 hours) at 03/20/2021 1325 Last data filed at 03/20/2021  0900 Gross per 24 hour  Intake 2030.78 ml  Output 450 ml  Net 1580.78 ml   Filed Weights   03/15/21 0449  Weight: 40.8 kg    Examination:  General exam: Appears calm and comfortable  Respiratory system: Clear to auscultation. Respiratory effort normal. Cardiovascular system: S1 & S2 heard, RRR. No JVD, murmurs, rubs, gallops or clicks.  Gastrointestinal system: Soft, incisional soreness.  Hypoactive bowel sounds Central nervous system: Alert and oriented.  Grossly intact Extremities: No edema Skin: Warm dry Psychiatry: Judgement and insight appear normal. Mood & affect appropriate.     Data Reviewed: I have personally reviewed following labs and imaging studies  CBC: Recent Labs  Lab 03/14/21 0459 03/15/21 0514 03/15/21 1834 03/17/21 0424 03/17/21 1644 03/18/21 0348 03/19/21 0442 03/20/21 0438  WBC 6.3 5.6  --   --   --  6.1 15.0* 12.5*  NEUTROABS  --  3.8  --   --   --   --   --   --   HGB 7.9* 9.4*   < > 8.1* 7.5* 7.2* 8.4* 8.5*  HCT 24.6* 29.8*   < > 24.9* 24.0* 22.8* 27.4* 27.1*  MCV 86.9 88.4  --   --   --  88.0 90.4 87.1  PLT 391 498*  --   --   --  395 438* 466*   < > = values in this interval not displayed.   Basic Metabolic Panel: Recent Labs  Lab 03/15/21 0514 03/16/21 0533 03/18/21 0348 03/19/21 0442 03/20/21 0438  NA 137 137 134*  138 134*  K 3.9 3.9 3.8 4.3 4.5  CL 102 101 100 104 102  CO2 28 29 29  21* 26  GLUCOSE 102* 88 92 81 107*  BUN 9 11 7* 13 14  CREATININE 0.87 0.72 0.71 1.13* 0.91  CALCIUM 9.2 8.9 8.6* 8.3* 8.4*   GFR: Estimated Creatinine Clearance: 27.5 mL/min (by C-G formula based on SCr of 0.91 mg/dL). Liver Function Tests: Recent Labs  Lab 03/15/21 0514  AST 26  ALT 14  ALKPHOS 66  BILITOT 0.5  PROT 6.6  ALBUMIN 3.2*   Recent Labs  Lab 03/15/21 0514  LIPASE 42   No results for input(s): AMMONIA in the last 168 hours. Coagulation Profile: Recent Labs  Lab 03/14/21 0459 03/15/21 0514 03/17/21 0424  03/18/21 0348  INR 2.2* 2.2* 1.2 1.2     Recent Results (from the past 240 hour(s))  Resp Panel by RT-PCR (Flu A&B, Covid) Nasopharyngeal Swab     Status: None   Collection Time: 03/15/21  6:08 AM   Specimen: Nasopharyngeal Swab; Nasopharyngeal(NP) swabs in vial transport medium  Result Value Ref Range Status   SARS Coronavirus 2 by RT PCR NEGATIVE NEGATIVE Final    Comment: (NOTE) SARS-CoV-2 target nucleic acids are NOT DETECTED.  The SARS-CoV-2 RNA is generally detectable in upper respiratory specimens during the acute phase of infection. The lowest concentration of SARS-CoV-2 viral copies this assay can detect is 138 copies/mL. A negative result does not preclude SARS-Cov-2 infection and should not be used as the sole basis for treatment or other patient management decisions. A negative result may occur with  improper specimen collection/handling, submission of specimen other than nasopharyngeal swab, presence of viral mutation(s) within the areas targeted by this assay, and inadequate number of viral copies(<138 copies/mL). A negative result must be combined with clinical observations, patient history, and epidemiological information. The expected result is Negative.  Fact Sheet for Patients:  EntrepreneurPulse.com.au  Fact Sheet for Healthcare Providers:  IncredibleEmployment.be  This test is no t yet approved or cleared by the [place] FDA and  has been authorized for detection and/or diagnosis of SARS-CoV-2 by FDA under an Emergency Use Authorization (EUA). This EUA will remain  in effect (meaning this test can be used) for the duration of the COVID-19 declaration under Section 564(b)(1) of the Act, 21 U.S.C.section 360bbb-3(b)(1), unless the authorization is terminated  or revoked sooner.       Influenza A by PCR NEGATIVE NEGATIVE Final   Influenza B by PCR NEGATIVE NEGATIVE Final    Comment: (NOTE) The Xpert Xpress  SARS-CoV-2/FLU/RSV plus assay is intended as an aid in the diagnosis of influenza from Nasopharyngeal swab specimens and should not be used as a sole basis for treatment. Nasal washings and aspirates are unacceptable for Xpert Xpress SARS-CoV-2/FLU/RSV testing.  Fact Sheet for Patients: EntrepreneurPulse.com.au  Fact Sheet for Healthcare Providers: IncredibleEmployment.be  This test is not yet approved or cleared by the [place] FDA and has been authorized for detection and/or diagnosis of SARS-CoV-2 by FDA under an Emergency Use Authorization (EUA). This EUA will remain in effect (meaning this test can be used) for the duration of the COVID-19 declaration under Section 564(b)(1) of the Act, 21 U.S.C. section 360bbb-3(b)(1), unless the authorization is terminated or revoked.  Performed at [facility], [address], [place], [place] [address]   C Difficile Quick Screen w PCR reflex     Status: None   Collection Time: 03/16/21  7:45 AM   Specimen:  STOOL  Result Value Ref Range Status   C Diff antigen NEGATIVE NEGATIVE Final   C Diff toxin NEGATIVE NEGATIVE Final   C Diff interpretation No C. difficile detected.  Final    Comment: Performed at [facility], [address] [place], [place] [address]  Surgical pcr screen     Status: None   Collection Time: 03/17/21 11:59 PM   Specimen: Nasal Mucosa; Nasal Swab  Result Value Ref Range Status   MRSA, PCR NEGATIVE NEGATIVE Final   Staphylococcus aureus NEGATIVE NEGATIVE Final    Comment: (NOTE) The Xpert SA Assay (FDA approved for NASAL specimens in patients 74 years of age and older), is one component of a comprehensive surveillance program. It is not intended to diagnose infection nor to guide or monitor treatment. Performed at [facility], [address], [place], [place] [address]          Radiology Studies: No results  found.      Scheduled Meds: . amitriptyline  10 mg Oral QHS  . calcium carbonate  1,250 mg Oral Q breakfast  . cholecalciferol  1,000 Units Oral Daily  . diclofenac Sodium  4 g Topical QID  . estradiol  1 Applicatorful Vaginal Once per day on Mon Wed Fri  . feeding supplement  1 Container Oral TID BM  . folic acid  1 mg Oral Daily  . guaiFENesin  600 mg Oral BID  . heparin injection (subcutaneous)  5,000 Units Subcutaneous Q8H  . mirtazapine  30 mg Oral QHS  . pantoprazole  40 mg Oral Daily  . sodium chloride flush  3 mL Intravenous Q12H  . SUMAtriptan  50 mg Oral UD  . cyanocobalamin  1,000 mcg Oral Daily   Continuous Infusions: . sodium chloride 1,000 mL (03/17/21 1301)    Assessment & Plan:   Principal Problem:   Rectal bleeding Active Problems:   Anemia, unspecified   Hypertension   GERD (gastroesophageal reflux disease)   Colonic mass   Pulmonary embolism (HCC)   Rectal bleeding -POA None while here in the hospital She is status post recent exploratory laparotomy with lysis of adhesion for mesenteric volvulus recently d/c'd on 4/17.  Returns and found with CT scan of abdomen and pelvis shows a bulky mass in the ascending colon compatible with carcinoma which may explain patient's rectal bleeding.  Coumadin on hold, can be resumed on 4/24 per surgery S/p colonoscopy on 4/20- malignant partially obstructing tumor in the distal ascending colon -elective colectomy on 4/21 with Dr. Hampton Abbot  Adenocarcinoma of colon Discussed with Dr. Janese Banks who will coordinate outpatient follow-up although patient does not seem to be very interested in any kind of treatment at this time  Cough -new onset on 4/23 Recommend incentive spirometry.  I am adding Mucinex 600 mg p.o. twice daily to help break it down as she is not able to expectorate  Hypertension Hold BP meds as patient's blood pressure is stable   History of pulmonary embolism on chronic anticoagulation therapy Coumadin  on hold due to rectal bleeding and recent surgery.  Coumadin can be resumed on 4/24 per surgery Pharmacy managing Coumadin, INR 1.2.    Hiatal hernia with GERD Continue Protonix   DVT prophylaxis: SCD Code Status: DNR Family Communication: I have updated patient's daughter [name] at bedside on 4/23  Status is: Inpatient  Remains inpatient appropriate because:Ongoing diagnostic testing needed not appropriate for outpatient work up   Dispo: The patient is from: Home  Anticipated d/c is to: Home              Patient currently is not medically stable to d/c.   Difficult to place patient No     LOS: 4 days   Time spent: 35 minutes with more than 50% on COC   Max Sane, MD [facility] Pager [phone]  If 7PM-7AM, please contact night-coverage 03/20/2021, 1:25 PM

## 2021-03-20 NOTE — Consult Note (Signed)
[place] for Warfarin Indication: History of PE  Patient Measurements: Height: 5\' 1"  (154.9 cm) Weight: 40.8 kg (89 lb 15.2 oz) IBW/kg (Calculated) : 47.8  Labs: Recent Labs    03/18/21 0348 03/19/21 0442 03/20/21 0438  HGB 7.2* 8.4* 8.5*  HCT 22.8* 27.4* 27.1*  PLT 395 438* 466*  LABPROT 15.4*  --   --   INR 1.2  --   --   CREATININE 0.71 1.13* 0.91    Estimated Creatinine Clearance: 27.5 mL/min (by C-G formula based on SCr of 0.91 mg/dL).   Medications:  Patient follows with Coumadin clinic at [facility]. Last seen 03/09/21. 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 Patient subsequently found to have adenocarcinoma of the ascending colon and underwent right open colectomy on 4/21. Pharmacy has been consulted for warfarin for history of PE.  Goal of Therapy:  INR 2-3 Monitor platelets by anticoagulation protocol: Yes   Plan:  --Plan to re-start warfarin 4/24  Tawnya Crook, PharmD 03/20/2021,1:38 PM

## 2021-03-21 DIAGNOSIS — K219 Gastro-esophageal reflux disease without esophagitis: Secondary | ICD-10-CM

## 2021-03-21 LAB — CBC
HCT: 29 % — ABNORMAL LOW (ref 36.0–46.0)
Hemoglobin: 9.5 g/dL — ABNORMAL LOW (ref 12.0–15.0)
MCH: 28.2 pg (ref 26.0–34.0)
MCHC: 32.8 g/dL (ref 30.0–36.0)
MCV: 86.1 fL (ref 80.0–100.0)
Platelets: 518 10*3/uL — ABNORMAL HIGH (ref 150–400)
RBC: 3.37 MIL/uL — ABNORMAL LOW (ref 3.87–5.11)
RDW: 18.7 % — ABNORMAL HIGH (ref 11.5–15.5)
WBC: 10.8 10*3/uL — ABNORMAL HIGH (ref 4.0–10.5)
nRBC: 0 % (ref 0.0–0.2)

## 2021-03-21 LAB — PROTIME-INR
INR: 1.1 (ref 0.8–1.2)
Prothrombin Time: 14.5 seconds (ref 11.4–15.2)

## 2021-03-21 LAB — BASIC METABOLIC PANEL
Anion gap: 6 (ref 5–15)
BUN: 9 mg/dL (ref 8–23)
CO2: 27 mmol/L (ref 22–32)
Calcium: 8.9 mg/dL (ref 8.9–10.3)
Chloride: 102 mmol/L (ref 98–111)
Creatinine, Ser: 0.69 mg/dL (ref 0.44–1.00)
GFR, Estimated: >60 mL/min (ref >60)
Glucose, Bld: 95 mg/dL (ref 70–99)
Potassium: 4 mmol/L (ref 3.5–5.1)
Sodium: 135 mmol/L (ref 135–145)

## 2021-03-21 MED ORDER — LOPERAMIDE HCL 2 MG PO CAPS: 2.0000 mg | ORAL_CAPSULE | Freq: Four times a day (QID) | ORAL | Status: DC | PRN

## 2021-03-21 MED ORDER — WARFARIN SODIUM 7.5 MG PO TABS
7.5000 mg | ORAL_TABLET | Freq: Once | ORAL | Status: AC
  Administered 2021-03-21: 7.5 mg via ORAL
  Filled 2021-03-21: qty 1

## 2021-03-21 MED ORDER — WARFARIN - PHARMACIST DOSING INPATIENT: Freq: Every day | Status: DC

## 2021-03-21 NOTE — Consult Note (Signed)
[place] for Warfarin Indication: History of PE  Patient Measurements: Height: 5\' 1"  (154.9 cm) Weight: 40.8 kg (89 lb 15.2 oz) IBW/kg (Calculated) : 47.8  Labs: Recent Labs    03/19/21 0442 03/20/21 0438 03/21/21 0504  HGB 8.4* 8.5* 9.5*  HCT 27.4* 27.1* 29.0*  PLT 438* 466* 518*  LABPROT  --   --  14.5  INR  --   --  1.1  CREATININE 1.13* 0.91 0.69    Estimated Creatinine Clearance: 31.3 mL/min (by C-G formula based on SCr of 0.69 mg/dL).   Medications:  Patient follows with Coumadin clinic at [facility]. Last seen 03/09/21. 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 Patient subsequently found to have adenocarcinoma of the ascending colon and underwent right open colectomy on 4/21. Pharmacy has been consulted for warfarin for history of PE.  Goal of Therapy:  INR 2-3 Monitor platelets by anticoagulation protocol: Yes   Plan:  INR 1.1. Will give warfarin 7.5 mg (1.5 x home dose) tonight. Continue SQH for now. INR with morning labs.  Tawnya Crook, PharmD 03/21/2021,8:33 AM

## 2021-03-21 NOTE — Plan of Care (Signed)
  Problem: Clinical Measurements: Goal: Ability to maintain clinical measurements within normal limits will improve Outcome: Progressing Goal: Will remain free from infection Outcome: Progressing Goal: Diagnostic test results will improve Outcome: Progressing Goal: Respiratory complications will improve Outcome: Progressing Goal: Cardiovascular complication will be avoided Outcome: Progressing   Problem: Pain Managment: Goal: General experience of comfort will improve Outcome: Progressing   Patient is involved in and agrees with the plan of care. V/S stable. Reports pain on her abdomen and back; Oxycodone given with relief. BM noted this shift. Voiding well. Wound vac in place.

## 2021-03-21 NOTE — Progress Notes (Signed)
PROGRESS NOTE    Kristina Rowe  ZOX:[id number] DOB: 11/17/1930 DOA: 03/15/2021 PCP: Haydee Salter, MD    Brief Narrative:  Kristina Rowe is a 87 y.o. 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 She states that prior to leaving the hospital that she had loose stools containing blood and was told this was expected following surgery. Since her discharge home she has had 3 episodes of stool containing bright red blood and states that she has fecal incontinence.  This concerned her and prompted her return to the emergency room. She has abdominal discomfort in the right lower quadrant but denies having any pain. She complains of feeling dizzy and lightheaded but has not had any falls.  Consultants:   General surgery, GI  Procedures: CT of abdomen and pelvis: 1. Bulky ascending colon mass compatible with carcinoma. Possible ileocolic adenopathy and extra serosal extension. 2. No recurrent volvulus. 3. Left groin hernia, potentially femoral. 4. Numerous chronic findings are described above.     Subjective: Cough is improving.  Wants to advance diet as she is hungry.  Reports none bloody bowel movement last night  Objective: Vitals:   03/21/21 0016 03/21/21 0430 03/21/21 0820 03/21/21 1155  BP: (!) 96/58 111/70 126/79 107/81  Pulse: 91 84 87 89  Resp: 18 16 (!) 24 18  Temp: 97.9 F (36.6 C) (!) 97.4 F (36.3 C) 98 F (36.7 C) 98 F (36.7 C)  TempSrc: Oral Oral Oral Oral  SpO2: 94% 94% 99% 100%  Weight:      Height:        Intake/Output Summary (Last 24 hours) at 03/21/2021 1444 Last data filed at 03/21/2021 0900 Gross per 24 hour  Intake 483 ml  Output 0 ml  Net 483 ml    Filed Weights   03/15/21 0449  Weight: 40.8 kg    Examination:  General exam: Appears calm and comfortable  Respiratory system: Clear to auscultation. Respiratory effort normal. Cardiovascular system: S1 & S2 heard, RRR. No JVD, murmurs, rubs, gallops or clicks.  Gastrointestinal system: Soft, incisional soreness.  Hypoactive bowel sounds Central nervous system: Alert and oriented.  Grossly intact Extremities: No edema Skin: Warm dry Psychiatry: Judgement and insight appear normal. Mood & affect appropriate.     Data Reviewed: I have personally reviewed following labs and imaging studies  CBC: Recent Labs  Lab 03/15/21 0514 03/15/21 1834 03/17/21 1644 03/18/21 0348 03/19/21 0442 03/20/21 0438 03/21/21 0504  WBC 5.6  --   --  6.1 15.0* 12.5* 10.8*  NEUTROABS 3.8  --   --   --   --   --   --   HGB 9.4*   < > 7.5* 7.2* 8.4* 8.5* 9.5*  HCT 29.8*   < > 24.0* 22.8* 27.4* 27.1* 29.0*  MCV 88.4  --   --  88.0 90.4 87.1 86.1  PLT 498*  --   --  395 438* 466* 518*   < > = values in this interval not displayed.   Basic Metabolic Panel: Recent Labs  Lab 03/16/21 0533 03/18/21 0348 03/19/21 0442 03/20/21 0438 03/21/21 0504  NA 137 134* 138 134* 135  K 3.9 3.8 4.3 4.5 4.0  CL 101 100 104 102 102  CO2 29 29 21* 26  27  GLUCOSE 88 92 81 107* 95  BUN 11 7* 13 14 9   CREATININE 0.72 0.71 1.13* 0.91 0.69  CALCIUM 8.9 8.6* 8.3* 8.4* 8.9   GFR: Estimated Creatinine Clearance: 31.3 mL/min (by C-G formula based on SCr of 0.69 mg/dL). Liver Function Tests: Recent Labs  Lab 03/15/21 0514  AST 26  ALT 14  ALKPHOS 66  BILITOT 0.5  PROT 6.6  ALBUMIN 3.2*   Recent Labs  Lab 03/15/21 0514  LIPASE 42   No results for input(s): AMMONIA in the last 168 hours. Coagulation Profile: Recent Labs  Lab 03/15/21 0514 03/17/21 0424 03/18/21 0348 03/21/21 0504  INR 2.2* 1.2 1.2 1.1     Recent Results (from the past 240 hour(s))  Resp Panel by RT-PCR (Flu A&B, Covid)  Nasopharyngeal Swab     Status: None   Collection Time: 03/15/21  6:08 AM   Specimen: Nasopharyngeal Swab; Nasopharyngeal(NP) swabs in vial transport medium  Result Value Ref Range Status   SARS Coronavirus 2 by RT PCR NEGATIVE NEGATIVE Final    Comment: (NOTE) SARS-CoV-2 target nucleic acids are NOT DETECTED.  The SARS-CoV-2 RNA is generally detectable in upper respiratory specimens during the acute phase of infection. The lowest concentration of SARS-CoV-2 viral copies this assay can detect is 138 copies/mL. A negative result does not preclude SARS-Cov-2 infection and should not be used as the sole basis for treatment or other patient management decisions. A negative result may occur with  improper specimen collection/handling, submission of specimen other than nasopharyngeal swab, presence of viral mutation(s) within the areas targeted by this assay, and inadequate number of viral copies(<138 copies/mL). A negative result must be combined with clinical observations, patient history, and epidemiological information. The expected result is Negative.  Fact Sheet for Patients:  EntrepreneurPulse.com.au  Fact Sheet for Healthcare Providers:  IncredibleEmployment.be  This test is no t yet approved or cleared by the [place] FDA and  has been authorized for detection and/or diagnosis of SARS-CoV-2 by FDA under an Emergency Use Authorization (EUA). This EUA will remain  in effect (meaning this test can be used) for the duration of the COVID-19 declaration under Section 564(b)(1) of the Act, 21 U.S.C.section 360bbb-3(b)(1), unless the authorization is terminated  or revoked sooner.       Influenza A by PCR NEGATIVE NEGATIVE Final   Influenza B by PCR NEGATIVE NEGATIVE Final    Comment: (NOTE) The Xpert Xpress SARS-CoV-2/FLU/RSV plus assay is intended as an aid in the diagnosis of influenza from Nasopharyngeal swab specimens and should not be  used as a sole basis for treatment. Nasal washings and aspirates are unacceptable for Xpert Xpress SARS-CoV-2/FLU/RSV testing.  Fact Sheet for Patients: EntrepreneurPulse.com.au  Fact Sheet for Healthcare Providers: IncredibleEmployment.be  This test is not yet approved or cleared by the [place] FDA and has been authorized for detection and/or diagnosis of SARS-CoV-2 by FDA under an Emergency Use Authorization (EUA). This EUA will remain in effect (meaning this test can be used) for the duration of the COVID-19 declaration under Section 564(b)(1) of the Act, 21 U.S.C. section 360bbb-3(b)(1), unless the authorization is terminated or revoked.  Performed at [facility], [place],  [address]   C Difficile Quick Screen w PCR reflex     Status: None   Collection Time: 03/16/21  7:45 AM   Specimen: STOOL  Result Value Ref Range Status   C Diff antigen NEGATIVE NEGATIVE Final   C Diff toxin NEGATIVE NEGATIVE  Final   C Diff interpretation No C. difficile detected.  Final    Comment: Performed at [facility], [address], [place], [place] [address]  Surgical pcr screen     Status: None   Collection Time: 03/17/21 11:59 PM   Specimen: Nasal Mucosa; Nasal Swab  Result Value Ref Range Status   MRSA, PCR NEGATIVE NEGATIVE Final   Staphylococcus aureus NEGATIVE NEGATIVE Final    Comment: (NOTE) The Xpert SA Assay (FDA approved for NASAL specimens in patients 45 years of age and older), is one component of a comprehensive surveillance program. It is not intended to diagnose infection nor to guide or monitor treatment. Performed at [facility], [address] [place],  [address]          Radiology Studies: No results found.      Scheduled Meds: . amitriptyline  10 mg Oral QHS  . calcium carbonate  1,250 mg Oral Q breakfast  . cholecalciferol  1,000 Units Oral Daily  .  diclofenac Sodium  4 g Topical QID  . estradiol  1 Applicatorful Vaginal Once per day on Mon Wed Fri  . feeding supplement  1 Container Oral TID BM  . folic acid  1 mg Oral Daily  . guaiFENesin  600 mg Oral BID  . heparin injection (subcutaneous)  5,000 Units Subcutaneous Q8H  . mirtazapine  30 mg Oral QHS  . pantoprazole  40 mg Oral Daily  . sodium chloride flush  3 mL Intravenous Q12H  . SUMAtriptan  50 mg Oral UD  . cyanocobalamin  1,000 mcg Oral Daily  . warfarin  7.5 mg Oral ONCE-1600  . Warfarin - Pharmacist Dosing Inpatient   Does not apply q1600   Continuous Infusions: . sodium chloride 1,000 mL (03/17/21 1301)    Assessment & Plan:   Principal Problem:   Rectal bleeding Active Problems:   Anemia, unspecified   Hypertension   GERD (gastroesophageal reflux disease)   Colonic mass   Pulmonary embolism (HCC)   Rectal bleeding -POA None while here in the hospital Resume Coumadin today on 4/24  Adenocarcinoma of colon Discussed with Dr. Janese Banks who will coordinate outpatient follow-up although patient does not seem to be very interested in any kind of treatment at this time Status post open right colectomy on 4/21.  Advancing to soft diet this morning.  Surgeon changed her dressing to smaller/more portable for easier ambulation  Cough -new onset on 4/23 Continue incentive spirometry and Mucinex 600 mg p.o. twice daily to help break it down as she is not able to expectorate.  She is feeling somewhat better today from coughing standpoint  Hypertension Hold BP meds as patient's blood pressure is stable   History of pulmonary embolism on chronic anticoagulation therapy Coumadin resumed today on 4/24 Pharmacy managing Coumadin, INR 1.1.    Hiatal hernia with GERD Continue Protonix   DVT prophylaxis: SCD, Coumadin Code Status: DNR Family Communication: I have updated patient's daughter [name] at bedside on 4/24  Status is: Inpatient  Remains inpatient appropriate  because:Ongoing diagnostic testing needed not appropriate for outpatient work up   Dispo: The patient is from: Home              Anticipated d/c is to: Home on 4/26              Patient currently is not medically stable to d/c.   Difficult to place patient No     LOS: 5 days  Time spent: 35 minutes with more than 50% on COC   Max Sane, MD [facility] Pager [phone]  If 7PM-7AM, please contact night-coverage 03/21/2021, 2:44 PM

## 2021-03-21 NOTE — Progress Notes (Signed)
03/21/2021  Subjective: Patient is 3 Days Post-Op open right colectomy.  No acute events overnight.  Patient had a bowel movement with no blood in the stool.  Reports soreness in her abdomen but this has been improving.  Otherwise she feels better each day.  Her white blood cell count continues to improve and is 10.8 this morning with stable hemoglobin her renal function is now back to normal.  Vital signs: Temp:  [97.4 F (36.3 C)-98 F (36.7 C)] 98 F (36.7 C) (04/24 1155) Pulse Rate:  [82-91] 89 (04/24 1155) Resp:  [16-24] 18 (04/24 1155) BP: (96-126)/(58-81) 107/81 (04/24 1155) SpO2:  [94 %-100 %] 100 % (04/24 1155)   Intake/Output: 04/23 0701 - 04/24 0700 In: 903 [P.O.:900; I.V.:3] Out: 0  Last BM Date: 03/16/21  Physical Exam: Constitutional: No acute distress Abdomen: Soft, nondistended, appropriately sore to palpation.  Incision with Prevena VAC in place with good seal.  Labs:  Recent Labs    03/20/21 0438 03/21/21 0504  WBC 12.5* 10.8*  HGB 8.5* 9.5*  HCT 27.1* 29.0*  PLT 466* 518*   Recent Labs    03/20/21 0438 03/21/21 0504  NA 134* 135  K 4.5 4.0  CL 102 102  CO2 26 27  GLUCOSE 107* 95  BUN 14 9  CREATININE 0.91 0.69  CALCIUM 8.4* 8.9   Recent Labs    03/21/21 0504  LABPROT 14.5  INR 1.1    Imaging: No results found.  Assessment/Plan: This is a 87 y.o. female s/p open right colectomy.  - Agree with advancing the patient's diet to soft diet this morning.  Patient is remarkably doing well after 2 surgeries back-to-back.  Okay to resume her anticoagulation today with no bridging needed from the surgical standpoint. - The patient requested that we change the Prevena VAC suction canister and machine to the one that comes with the dressing itself which is smaller and more portable.  This way she will be able to ambulate better.  This exchange was done without any complications and again with good seal in the dressing. - I think she is doing well  with her soft diet today and no problems with anticoagulation bleeding, should be okay for discharge tomorrow from the surgical standpoint.  She would benefit from working again with physical therapy to see if she truly needs SNF for discharge versus home PT.   [name], [facility]

## 2021-03-21 NOTE — TOC Progression Note (Addendum)
Transition of Care [facility]) - Progression Note    Patient Details  Name: BRAILEE RIEDE MRN: [id number] Date of Birth: Mar 13, 1930  Transition of [facility]) CM/SW Brady, LCSW Phone Number: 03/21/2021, 11:52 AM  Clinical Narrative:   CSW spoke with patient. Patient and daughter would like PT to work with patient today if possible. Patient and daughter are open to home with home health if recommendation changes from SNF to [facility]. Patient says she does not have an agency preference but wants one with good reviews on [email or web address] Patient would want Aide services for [facility]. RN is asking PT to see patient.  12:50- Per PTA Sarah, patient would be appropriate for home with home health since this is what patient and family would like. Referral made to [facility] with Alvis Lemmings for PT, OT, and Aide.  Expected Discharge Plan: [place] Barriers to Discharge: Continued Medical Work up  Expected Discharge Plan and Services Expected Discharge Plan: [place] arrangements for the past 2 months: Single Family Home                                       Social Determinants of Health (SDOH) Interventions    Readmission Risk Interventions No flowsheet data found.

## 2021-03-21 NOTE — Progress Notes (Signed)
Physical Therapy Treatment Patient Details Name: Kristina Rowe MRN: [id number] DOB: November 10, 1930 Today's Date: 03/21/2021    History of Present Illness presented to ER secondary to bright red blood in stools; admitted for management of rectal bleeding, symptomatic anemia.  Work up significant for partially-obstructive mass in ascending colon (suspicious for malignancy), s/p open R colectomy (4/21).    PT Comments    Long discussion this am with team regarding discharge plan.  Daughter feels pt is doing better and would like to take her home and declining SNF.  PT is a consult service and pt/family have final decision over discharge plan.  If family/pt feel comfortable with discharge then that is their decision.  Pt seen for mobility this pm.  Stated she was recently up for gait with staff.  She agrees to gait again.  Bed mobility without difficulty.  She is able to walk 100' with RW and slow steady gait.  Global weakness is notd but no LOB or buckling.   Upon return to room she does voice some hesitancy over discharge home and not having 24 hour support.  Pt encouraged to have open discussion with daughter if she does not feel safe going home with level of assist.  SNF was originally recommended at eval.  While pt would still benefit from continued therapies and SNF could be justified, if pt decides to return home it is her choice to do so and she could be successful with proper support.  .    Follow Up Recommendations  SNF, HHPT if pt continues with choice to return home.      Equipment Recommendations    RW   Recommendations for Other Services       Precautions / Restrictions Precautions Precautions: Fall Precaution Comments: abdominal wound vac Restrictions Weight Bearing Restrictions: No    Mobility  Bed Mobility Overal bed mobility: Needs Assistance Bed Mobility: Supine to Sit;Sit to Supine     Supine to sit: Supervision Sit to supine: Supervision         Transfers Overall transfer level: Needs assistance Equipment used: Rolling walker (2 wheeled) Transfers: Sit to/from Stand Sit to Stand: Min guard            Ambulation/Gait Ambulation/Gait assistance: Min guard;Min assist Gait 100'  Assistive device: Rolling walker (2 wheeled) Gait Pattern/deviations: Step-through pattern Gait velocity: decreased   General Gait Details: generally steady but some global weakness noted   Stairs             Wheelchair Mobility    Modified Rankin (Stroke Patients Only)       Balance Overall balance assessment: Needs assistance Sitting-balance support: No upper extremity supported;Feet supported Sitting balance-Leahy Scale: Good     Standing balance support: Bilateral upper extremity supported Standing balance-Leahy Scale: Fair                              Cognition Arousal/Alertness: Awake/alert Behavior During Therapy: WFL for tasks assessed/performed Overall Cognitive Status: Within Functional Limits for tasks assessed                                        Exercises      General Comments        Pertinent Vitals/Pain Pain Assessment: Faces Faces Pain Scale: Hurts a little bit Pain Location: abdomen Pain Descriptors / Indicators:  Operative site guarding Pain Intervention(s): Limited activity within patient's tolerance;Monitored during session;Repositioned    Home Living                      Prior Function            PT Goals (current goals can now be found in the care plan section) Progress towards PT goals: Progressing toward goals    Frequency    Min 2X/week      PT Plan      Co-evaluation              AM-PAC PT "6 Clicks" Mobility   Outcome Measure  Help needed turning from your back to your side while in a flat bed without using bedrails?: None Help needed moving from lying on your back to sitting on the side of a flat bed without using  bedrails?: A Little Help needed moving to and from a bed to a chair (including a wheelchair)?: A Little Help needed standing up from a chair using your arms (e.g., wheelchair or bedside chair)?: A Little Help needed to walk in hospital room?: A Little Help needed climbing 3-5 steps with a railing? : A Little 6 Click Score: 19    End of Session   Activity Tolerance: Patient tolerated treatment well Patient left: in chair;with call bell/phone within reach;with family/visitor present Nurse Communication: Mobility status PT Visit Diagnosis: Difficulty in walking, not elsewhere classified (R26.2)     Time: 7035-0093 PT Time Calculation (min) (ACUTE ONLY): 12 min  Charges:  $Gait Training: 8-22 mins                    Chesley Noon, PTA 03/21/21, 2:33 PM

## 2021-03-22 LAB — BASIC METABOLIC PANEL
Anion gap: 5 (ref 5–15)
BUN: 8 mg/dL (ref 8–23)
CO2: 28 mmol/L (ref 22–32)
Calcium: 8.3 mg/dL — ABNORMAL LOW (ref 8.9–10.3)
Chloride: 102 mmol/L (ref 98–111)
Creatinine, Ser: 0.63 mg/dL (ref 0.44–1.00)
GFR, Estimated: >60 mL/min (ref >60)
Glucose, Bld: 95 mg/dL (ref 70–99)
Potassium: 3.7 mmol/L (ref 3.5–5.1)
Sodium: 135 mmol/L (ref 135–145)

## 2021-03-22 LAB — CBC
HCT: 21.8 % — ABNORMAL LOW (ref 36.0–46.0)
Hemoglobin: 7.2 g/dL — ABNORMAL LOW (ref 12.0–15.0)
MCH: 28.6 pg (ref 26.0–34.0)
MCHC: 33 g/dL (ref 30.0–36.0)
MCV: 86.5 fL (ref 80.0–100.0)
Platelets: 438 10*3/uL — ABNORMAL HIGH (ref 150–400)
RBC: 2.52 MIL/uL — ABNORMAL LOW (ref 3.87–5.11)
RDW: 18.6 % — ABNORMAL HIGH (ref 11.5–15.5)
WBC: 7.3 10*3/uL (ref 4.0–10.5)
nRBC: 0 % (ref 0.0–0.2)

## 2021-03-22 LAB — PROTIME-INR
INR: 1.3 — ABNORMAL HIGH (ref 0.8–1.2)
Prothrombin Time: 16.2 seconds — ABNORMAL HIGH (ref 11.4–15.2)

## 2021-03-22 MED ORDER — WARFARIN SODIUM 7.5 MG PO TABS
7.5000 mg | ORAL_TABLET | Freq: Once | ORAL | Status: AC
  Administered 2021-03-22: 7.5 mg via ORAL
  Filled 2021-03-22: qty 1

## 2021-03-22 NOTE — Progress Notes (Signed)
[place] Hospital Day(s): 6.   Post op day(s): 4 Days Post-Op.   Interval History:  Patient seen and examined No acute events or new complaints overnight.  Patient reports she is feeling a little better each day but still very weak She has expected incisional soreness No fever, chills, nausea, emesis Her previous leukocytosis is resolved this morning; WBC 7.3K Hgb down trending to 7.2; but this appear mostly dilutional She has maintained normal renal function; sCr - 0.63; UO - 200 ccs + unmeasured No electrolyte derangements She has tolerated regular diet She continues to have bowel function She has worked with PT who is recommending SNF vs HHPT; but patient prefers to be home Plan for discharge tomorrow (04/26)   Vital signs in last 24 hours: [min-max] current  Temp:  [97.6 F (36.4 C)-98 F (36.7 C)] 97.8 F (36.6 C) (04/25 0813) Pulse Rate:  [82-95] 95 (04/25 0813) Resp:  [18-20] 18 (04/25 0813) BP: (91-109)/(50-81) 109/77 (04/25 0813) SpO2:  [95 %-100 %] 95 % (04/25 0436)     Height: 5\' 1"  (154.9 cm) Weight: 40.8 kg BMI (Calculated): 17   Intake/Output last 2 shifts:  04/24 0701 - 04/25 0700 In: 240 [P.O.:240] Out: 200 [Urine:200]   Physical Exam:  Constitutional: alert, cooperative and no distress  Respiratory: breathing non-labored at rest  Cardiovascular: regular rate and sinus rhythm  Gastrointestinal: Soft, incisional soreness, and non-distended, no rebound/guarding Integumentary: Laparotomy incision Is intact with Prevena in place  Labs:  CBC Latest Ref Rng & Units 03/22/2021 03/21/2021 03/20/2021  WBC 4.0 - 10.5 K/uL 7.3 10.8(H) 12.5(H)  Hemoglobin 12.0 - 15.0 g/dL 7.2(L) 9.5(L) 8.5(L)  Hematocrit 36.0 - 46.0 % 21.8(L) 29.0(L) 27.1(L)  Platelets 150 - 400 K/uL 438(H) 518(H) 466(H)   CMP Latest Ref Rng & Units 03/22/2021 03/21/2021 03/20/2021  Glucose 70 - 99 mg/dL 95 95 107(H)  BUN 8 - 23 mg/dL 8 9 14   Creatinine  0.44 - 1.00 mg/dL 0.63 0.69 0.91  Sodium 135 - 145 mmol/L 135 135 134(L)  Potassium 3.5 - 5.1 mmol/L 3.7 4.0 4.5  Chloride 98 - 111 mmol/L 102 102 102  CO2 22 - 32 mmol/L 28 27 26   Calcium 8.9 - 10.3 mg/dL 8.3(L) 8.9 8.4(L)  Total Protein 6.5 - 8.1 g/dL - - -  Total Bilirubin 0.3 - 1.2 mg/dL - - -  Alkaline Phos 38 - 126 U/L - - -  AST 15 - 41 U/L - - -  ALT 0 - 44 U/L - - -    Imaging studies: No new pertinent imaging studies   Assessment/Plan:  87 y.o. female 4 Days Post-Op s/p open right colectomy for adenocarcinoma of the ascending colon   - Okay to continue regular diet    - Continue Prevena Vac; typically x7 days             - Monitor abdominal examination; on-going bowel function             - Pain control prn; antiemetics prn   - Mobilization as tolerated; PT following             - Further management per primary service; we will follow     - Discharge Planning; Plan for DC home tomorrow; she will follow up on Friday (04/29) for Prevena removal   All of the above findings and recommendations were discussed with the patient, and the medical team, and all of patient's questions were answered to her  expressed satisfaction.  -- Edison Simon, PA-C [facility] 03/22/2021, 9:09 AM [phone] M-F: 7am - 4pm

## 2021-03-22 NOTE — Consult Note (Signed)
[place] for Warfarin Indication: History of PE  Patient Measurements: Height: 5\' 1"  (154.9 cm) Weight: 40.8 kg (89 lb 15.2 oz) IBW/kg (Calculated) : 47.8  Labs: Recent Labs    03/20/21 0438 03/21/21 0504 03/22/21 0357  HGB 8.5* 9.5* 7.2*  HCT 27.1* 29.0* 21.8*  PLT 466* 518* 438*  LABPROT  --  14.5 16.2*  INR  --  1.1 1.3*  CREATININE 0.91 0.69 0.63    Estimated Creatinine Clearance: 31.3 mL/min (by C-G formula based on SCr of 0.63 mg/dL).   Medications:  Patient follows with Coumadin clinic at [facility]. Last seen 03/09/21. 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 Patient subsequently found to have adenocarcinoma of the ascending colon and underwent right open colectomy on 4/21. Pharmacy has been consulted for warfarin for history of PE.  4/18: 5 mg IV vit K   Goal of Therapy:  INR 2-3 Monitor platelets by anticoagulation protocol: Yes   Plan:   Repeat warfarin 7.5 mg (1.5 x home dose) tonight  Continue SQH for now  INR with morning labs  Dallie Piles, PharmD 03/22/2021,9:12 AM

## 2021-03-22 NOTE — Progress Notes (Signed)
Physical Therapy Treatment Patient Details Name: Kristina Rowe MRN: [id number] DOB: 1930/07/16 Today's Date: 03/22/2021    History of Present Illness presented to ER secondary to bright red blood in stools; admitted for management of rectal bleeding, symptomatic anemia.  Work up significant for partially-obstructive mass in ascending colon (suspicious for malignancy), s/p open R colectomy (4/21).    PT Comments    Pt in recliner upon arrival.  Stood and is able to progress gait to 100' x 2 with RW and min guard.  Seated rest in chair in hallway.  Pt making good gains in mobility, balance and confidence.  She stated she is comfortable going home tomorrow.  Was able to get/to from commode at bedside on her own during the night.  Asked if she could walk to commode at end of bed on her own but was encouraged to have daughter or staff with her to do so for safety.  Voiced understanding.  Pt encouraged to walk with nursing staff as time allows later in day/evening.   Pt has made good improvements with mobility over the week-end.  Stated she will have assist at home.  Given improvements and pt voicing good discharge plan will update d/c recommendations to reflect pt/family choice.    Follow Up Recommendations  Home health PT;Outpatient PT;Supervision for mobility/OOB     Equipment Recommendations  Rolling walker with 5" wheels;3in1 (PT)    Recommendations for Other Services       Precautions / Restrictions Precautions Precautions: Fall Precaution Comments: abdominal wound vac Restrictions Weight Bearing Restrictions: No    Mobility  Bed Mobility               General bed mobility comments: in recliner before and after    Transfers Overall transfer level: Needs assistance Equipment used: Rolling walker (2 wheeled) Transfers: Sit to/from Stand Sit to Stand: Min guard            Ambulation/Gait Ambulation/Gait assistance: Min guard;Min assist Gait Distance (Feet): 100  Feet Assistive device: Rolling walker (2 wheeled) Gait Pattern/deviations: Step-through pattern;Trunk flexed Gait velocity: decreased   General Gait Details: 100' x 2 with seated rest in chair in hallway   Stairs             Wheelchair Mobility    Modified Rankin (Stroke Patients Only)       Balance Overall balance assessment: Needs assistance Sitting-balance support: No upper extremity supported;Feet supported Sitting balance-Leahy Scale: Good     Standing balance support: Bilateral upper extremity supported Standing balance-Leahy Scale: Fair                              Cognition Arousal/Alertness: Awake/alert Behavior During Therapy: WFL for tasks assessed/performed Overall Cognitive Status: Within Functional Limits for tasks assessed                                        Exercises      General Comments        Pertinent Vitals/Pain Pain Assessment: No/denies pain    Home Living                      Prior Function            PT Goals (current goals can now be found in the care plan section) Progress towards  PT goals: Progressing toward goals    Frequency    Min 2X/week      PT Plan      Co-evaluation              AM-PAC PT "6 Clicks" Mobility   Outcome Measure  Help needed turning from your back to your side while in a flat bed without using bedrails?: None Help needed moving from lying on your back to sitting on the side of a flat bed without using bedrails?: A Little Help needed moving to and from a bed to a chair (including a wheelchair)?: A Little Help needed standing up from a chair using your arms (e.g., wheelchair or bedside chair)?: A Little Help needed to walk in hospital room?: A Little Help needed climbing 3-5 steps with a railing? : A Little 6 Click Score: 19    End of Session   Activity Tolerance: Patient tolerated treatment well Patient left: in chair;with call bell/phone  within reach;with family/visitor present Nurse Communication: Mobility status PT Visit Diagnosis: Difficulty in walking, not elsewhere classified (R26.2)     Time: 9983-3825 PT Time Calculation (min) (ACUTE ONLY): 15 min  Charges:  $Gait Training: 8-22 mins                    Chesley Noon, PTA 03/22/21, 11:34 AM

## 2021-03-22 NOTE — Progress Notes (Addendum)
PROGRESS NOTE    Kristina Rowe  JYN:[id number] DOB: 12/14/1929 DOA: 03/15/2021 PCP: Haydee Salter, MD    Brief Narrative:  Kristina Rowe is a 87 y.o. 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 She states that prior to leaving the hospital that she had loose stools containing blood and was told this was expected following surgery. Since her discharge home she has had 3 episodes of stool containing bright red blood and states that she has fecal incontinence.  This concerned her and prompted her return to the emergency room. She has abdominal discomfort in the right lower quadrant but denies having any pain. She complains of feeling dizzy and lightheaded but has not had any falls.  Consultants:   General surgery, GI  Procedures: CT of abdomen and pelvis: 1. Bulky ascending colon mass compatible with carcinoma. Possible ileocolic adenopathy and extra serosal extension. 2. No recurrent volvulus. 3. Left groin hernia, potentially femoral. 4. Numerous chronic findings are described above.     Subjective: Had some issues with coffee (by mistake added some spice) but feels fine. Looking forward to work with PT  Objective: Vitals:   03/22/21 0436 03/22/21 0813 03/22/21 1117 03/22/21 1622  BP: 105/79 109/77 124/80 122/80  Pulse: 82 95 94 93  Resp: 18 18 16 16   Temp: 98 F (36.7 C) 97.8 F (36.6 C) 98.2 F (36.8 C) 98 F (36.7 C)  TempSrc:  Oral Oral Oral  SpO2: 95% 95% 100% 100%  Weight:      Height:        Intake/Output Summary (Last 24 hours) at 03/22/2021 1624 Last data filed at 03/22/2021 1019 Gross per 24 hour  Intake 240 ml  Output --  Net 240 ml   Filed Weights    03/15/21 0449  Weight: 40.8 kg    Examination:  General exam: Appears calm and comfortable  Respiratory system: Clear to auscultation. Respiratory effort normal. Cardiovascular system: S1 & S2 heard, RRR. No JVD, murmurs, rubs, gallops or clicks.  Gastrointestinal system: Soft, nontender, non distended, dressing in place. Central nervous system: Alert and oriented.  Grossly intact Extremities: No edema Skin: Warm dry Psychiatry: Judgement and insight appear normal. Mood & affect appropriate.     Data Reviewed: I have personally reviewed following labs and imaging studies  CBC: Recent Labs  Lab 03/18/21 0348 03/19/21 0442 03/20/21 0438 03/21/21 0504 03/22/21 0357  WBC 6.1 15.0* 12.5* 10.8* 7.3  HGB 7.2* 8.4* 8.5* 9.5* 7.2*  HCT 22.8* 27.4* 27.1* 29.0* 21.8*  MCV 88.0 90.4 87.1 86.1 86.5  PLT 395 438* 466* 518* 865*   Basic Metabolic Panel: Recent Labs  Lab 03/18/21 0348 03/19/21 0442 03/20/21 0438 03/21/21 0504 03/22/21 0357  NA 134* 138 134* 135 135  K 3.8 4.3 4.5 4.0 3.7  CL 100 104 102 102 102  CO2 29 21* 26 27 28   GLUCOSE 92 81 107* 95 95  BUN 7* 13 14 9 8   CREATININE 0.71 1.13* 0.91 0.69 0.63  CALCIUM 8.6* 8.3* 8.4* 8.9 8.3*   GFR: Estimated Creatinine Clearance: 31.3 mL/min (by C-G formula based on SCr of 0.63 mg/dL). Liver Function Tests: No results for input(s): AST, ALT, ALKPHOS, BILITOT, PROT, ALBUMIN in the last 168 hours. No results for input(s): LIPASE,  AMYLASE in the last 168 hours. No results for input(s): AMMONIA in the last 168 hours. Coagulation Profile: Recent Labs  Lab 03/17/21 0424 03/18/21 0348 03/21/21 0504 03/22/21 0357  INR 1.2 1.2 1.1 1.3*     Recent Results (from the past 240 hour(s))  Resp Panel by RT-PCR (Flu A&B, Covid) Nasopharyngeal Swab     Status: None   Collection Time: 03/15/21  6:08 AM   Specimen: Nasopharyngeal Swab; Nasopharyngeal(NP) swabs in vial transport medium  Result Value Ref Range Status   SARS  Coronavirus 2 by RT PCR NEGATIVE NEGATIVE Final    Comment: (NOTE) SARS-CoV-2 target nucleic acids are NOT DETECTED.  The SARS-CoV-2 RNA is generally detectable in upper respiratory specimens during the acute phase of infection. The lowest concentration of SARS-CoV-2 viral copies this assay can detect is 138 copies/mL. A negative result does not preclude SARS-Cov-2 infection and should not be used as the sole basis for treatment or other patient management decisions. A negative result may occur with  improper specimen collection/handling, submission of specimen other than nasopharyngeal swab, presence of viral mutation(s) within the areas targeted by this assay, and inadequate number of viral copies(<138 copies/mL). A negative result must be combined with clinical observations, patient history, and epidemiological information. The expected result is Negative.  Fact Sheet for Patients:  EntrepreneurPulse.com.au  Fact Sheet for Healthcare Providers:  IncredibleEmployment.be  This test is no t yet approved or cleared by the [place] FDA and  has been authorized for detection and/or diagnosis of SARS-CoV-2 by FDA under an Emergency Use Authorization (EUA). This EUA will remain  in effect (meaning this test can be used) for the duration of the COVID-19 declaration under Section 564(b)(1) of the Act, 21 U.S.C.section 360bbb-3(b)(1), unless the authorization is terminated  or revoked sooner.       Influenza A by PCR NEGATIVE NEGATIVE Final   Influenza B by PCR NEGATIVE NEGATIVE Final    Comment: (NOTE) The Xpert Xpress SARS-CoV-2/FLU/RSV plus assay is intended as an aid in the diagnosis of influenza from Nasopharyngeal swab specimens and should not be used as a sole basis for treatment. Nasal washings and aspirates are unacceptable for Xpert Xpress SARS-CoV-2/FLU/RSV testing.  Fact Sheet for  Patients: EntrepreneurPulse.com.au  Fact Sheet for Healthcare Providers: IncredibleEmployment.be  This test is not yet approved or cleared by the [place] FDA and has been authorized for detection and/or diagnosis of SARS-CoV-2 by FDA under an Emergency Use Authorization (EUA). This EUA will remain in effect (meaning this test can be used) for the duration of the COVID-19 declaration under Section 564(b)(1) of the Act, 21 U.S.C. section 360bbb-3(b)(1), unless the authorization is terminated or revoked.  Performed at [facility], [place], [place] [address]   C Difficile Quick Screen w PCR reflex     Status: None   Collection Time: 03/16/21  7:45 AM   Specimen: STOOL  Result Value Ref Range Status   C Diff antigen NEGATIVE NEGATIVE Final   C Diff toxin NEGATIVE NEGATIVE Final   C Diff interpretation No C. difficile detected.  Final    Comment: Performed at [facility], [address], [place], [place] [address]  Surgical pcr screen     Status: None   Collection Time: 03/17/21 11:59 PM   Specimen: Nasal Mucosa; Nasal Swab  Result Value Ref Range Status   MRSA, PCR NEGATIVE NEGATIVE Final   Staphylococcus aureus NEGATIVE NEGATIVE Final    Comment: (NOTE) The Xpert SA  Assay (FDA approved for NASAL specimens in patients 13 years of age and older), is one component of a comprehensive surveillance program. It is not intended to diagnose infection nor to guide or monitor treatment. Performed at [facility], [address] [place], [place] [address]          Radiology Studies: No results found.      Scheduled Meds: . amitriptyline  10 mg Oral QHS  . calcium carbonate  1,250 mg Oral Q breakfast  . cholecalciferol  1,000 Units Oral Daily  . diclofenac Sodium  4 g Topical QID  . estradiol  1 Applicatorful Vaginal Once per day on Mon Wed Fri  . feeding supplement  1 Container Oral TID  BM  . folic acid  1 mg Oral Daily  . guaiFENesin  600 mg Oral BID  . heparin injection (subcutaneous)  5,000 Units Subcutaneous Q8H  . mirtazapine  30 mg Oral QHS  . pantoprazole  40 mg Oral Daily  . sodium chloride flush  3 mL Intravenous Q12H  . SUMAtriptan  50 mg Oral UD  . cyanocobalamin  1,000 mcg Oral Daily  . Warfarin - Pharmacist Dosing Inpatient   Does not apply q1600   Continuous Infusions: . sodium chloride 1,000 mL (03/17/21 1301)    Assessment & Plan:   Principal Problem:   Rectal bleeding Active Problems:   Anemia, unspecified   Hypertension   GERD (gastroesophageal reflux disease)   Colonic mass   Pulmonary embolism (HCC)   Rectal bleeding -POA None while here in the hospital On coumadin, INR 1.3. pharmacy managing this.  Anemia of chronic dz Hb 7.2 - no obvious bleed but if Hb < 7, may need 1 PRBC  Adenocarcinoma of colon Discussed with Dr. Janese Banks who will coordinate outpatient follow-up although patient does not seem to be very interested in any kind of treatment at this time Status post open right colectomy on 4/21.  Tolerating diet  Cough -new onset on 4/23, improving now Continue incentive spirometry and Mucinex 600 mg p.o. twice daily  Hypertension Hold BP meds as patient's blood pressure is stable   History of pulmonary embolism on chronic anticoagulation therapy Pharmacy managing Coumadin, INR 1.3    Hiatal hernia with GERD Continue Protonix   DVT prophylaxis: SCD, Coumadin Code Status: DNR Family Communication: I have updated patient's daughter [name] at bedside on 4/25  Status is: Inpatient  Remains inpatient appropriate because:Ongoing diagnostic testing needed not appropriate for outpatient work up   Dispo: The patient is from: Home              Anticipated d/c is to: Home on 4/26              Patient currently is not medically stable to d/c.   Difficult to place patient No     LOS: 6 days   Time spent: 35 minutes with more  than 50% on COC   Max Sane, MD [facility] Pager [phone]  If 7PM-7AM, please contact night-coverage 03/22/2021, 4:24 PM

## 2021-03-22 NOTE — Care Management Important Message (Signed)
Important Message  Patient Details  Name: Kristina Rowe MRN: [id number] Date of Birth: 04-09-1930   Medicare Important Message Given:  Yes     Dannette Barbara 03/22/2021, 11:14 AM

## 2021-03-23 LAB — CBC
HCT: 23.8 % — ABNORMAL LOW (ref 36.0–46.0)
Hemoglobin: 7.7 g/dL — ABNORMAL LOW (ref 12.0–15.0)
MCH: 27.8 pg (ref 26.0–34.0)
MCHC: 32.4 g/dL (ref 30.0–36.0)
MCV: 85.9 fL (ref 80.0–100.0)
Platelets: 461 10*3/uL — ABNORMAL HIGH (ref 150–400)
RBC: 2.77 MIL/uL — ABNORMAL LOW (ref 3.87–5.11)
RDW: 18.6 % — ABNORMAL HIGH (ref 11.5–15.5)
WBC: 6.5 10*3/uL (ref 4.0–10.5)
nRBC: 0 % (ref 0.0–0.2)

## 2021-03-23 LAB — BASIC METABOLIC PANEL
Anion gap: 7 (ref 5–15)
BUN: 6 mg/dL — ABNORMAL LOW (ref 8–23)
CO2: 27 mmol/L (ref 22–32)
Calcium: 8.6 mg/dL — ABNORMAL LOW (ref 8.9–10.3)
Chloride: 102 mmol/L (ref 98–111)
Creatinine, Ser: 0.65 mg/dL (ref 0.44–1.00)
GFR, Estimated: >60 mL/min (ref >60)
Glucose, Bld: 88 mg/dL (ref 70–99)
Potassium: 3.7 mmol/L (ref 3.5–5.1)
Sodium: 136 mmol/L (ref 135–145)

## 2021-03-23 LAB — PROTIME-INR
INR: 2.2 — ABNORMAL HIGH (ref 0.8–1.2)
Prothrombin Time: 24.1 seconds — ABNORMAL HIGH (ref 11.4–15.2)

## 2021-03-23 NOTE — TOC Transition Note (Signed)
Transition of Care [facility]) - CM/SW Discharge Note   Patient Details  Name: AMYAH CLAWSON MRN: [id number] Date of Birth: [birth year]  [facility] of [facility] CM/SW Contact:  Candie Chroman, LCSW Phone Number: 03/23/2021, 9:24 AM   Clinical Narrative: Patient has orders to discharge home today. Alvis Lemmings is aware. Patient declined DME recommendations for RW and 3-in-1. She said her daughter has already gotten her a RW and she doesn't think she needs a 3-in-1 at this time. No further concerns. CSW signing off.    Final next level of care: [place] Barriers to Discharge: Barriers Resolved   Patient Goals and CMS Choice Patient states their goals for this hospitalization and ongoing recovery are:: SNF rehab CMS Medicare.gov Compare Post Acute Care list provided to:: Patient Choice offered to / list presented to : Patient  Discharge Placement                    Patient and family notified of of transfer: 03/23/21  Discharge Plan and Services                          HH Arranged: PT,OT,Nurse's Aide [facility] Agency: [place] Date [place]: 03/23/21   Representative spoke with at [place]: Adela Lank  Social Determinants of Health ([place]) Interventions     Readmission Risk Interventions No flowsheet data found.

## 2021-03-23 NOTE — Progress Notes (Signed)
Patient awaiting wheelchair for discharged to home, no acute distress noted.  Patient given discharge instructions, patient stated understanding and able to teach back information given.  Patient discharged with all valuables.  IV site d/ced. Care relinquished.

## 2021-03-23 NOTE — Progress Notes (Signed)
Pt is noted with bilateral lower extremeity pitting 2 edema. She states that it occurred after surgery. Legs were elevated above the heart throughout the night. Swelling is subsiding. Pt denies pain and no apparent distress is noted.

## 2021-03-23 NOTE — Progress Notes (Addendum)
[place] Hospital Day(s): 7.   Post op day(s): 5 Days Post-Op.   Interval History:  Patient seen and examined No acute events or new complaints overnight.  Patient reports she is doing well, ready to go home She has expected abdominal soreness No fever, chills, nausea, emesis Labs remain reassuring this morning She has been able to tolerate a regular diet Continues to have bowel function Worked with therapies; HHPT  Vital signs in last 24 hours: [min-max] current  Temp:  [97.3 F (36.3 C)-98.2 F (36.8 C)] 97.7 F (36.5 C) (04/26 0720) Pulse Rate:  [90-95] 90 (04/26 0720) Resp:  [16-20] 18 (04/26 0720) BP: (109-134)/(77-90) 131/84 (04/26 0720) SpO2:  [95 %-100 %] 97 % (04/26 0720)     Height: 5\' 1"  (154.9 cm) Weight: 40.8 kg BMI (Calculated): 17   Intake/Output last 2 shifts:  04/25 0701 - 04/26 0700 In: 240 [P.O.:240] Out: -    Physical Exam:  Constitutional: alert, cooperative and no distress  Respiratory: breathing non-labored at rest  Cardiovascular: regular rate and sinus rhythm  Gastrointestinal: Soft, incisional soreness, and non-distended, no rebound/guarding Integumentary: Laparotomy incision Is intact with Prevena in place  Labs:  CBC Latest Ref Rng & Units 03/23/2021 03/22/2021 03/21/2021  WBC 4.0 - 10.5 K/uL 6.5 7.3 10.8(H)  Hemoglobin 12.0 - 15.0 g/dL 7.7(L) 7.2(L) 9.5(L)  Hematocrit 36.0 - 46.0 % 23.8(L) 21.8(L) 29.0(L)  Platelets 150 - 400 K/uL 461(H) 438(H) 518(H)   CMP Latest Ref Rng & Units 03/23/2021 03/22/2021 03/21/2021  Glucose 70 - 99 mg/dL 88 95 95  BUN 8 - 23 mg/dL 6(L) 8 9  Creatinine 0.44 - 1.00 mg/dL 0.65 0.63 0.69  Sodium 135 - 145 mmol/L 136 135 135  Potassium 3.5 - 5.1 mmol/L 3.7 3.7 4.0  Chloride 98 - 111 mmol/L 102 102 102  CO2 22 - 32 mmol/L 27 28 27   Calcium 8.9 - 10.3 mg/dL 8.6(L) 8.3(L) 8.9  Total Protein 6.5 - 8.1 g/dL - - -  Total Bilirubin 0.3 - 1.2 mg/dL - - -  Alkaline Phos 38 -  126 U/L - - -  AST 15 - 41 U/L - - -  ALT 0 - 44 U/L - - -     Imaging studies: No new pertinent imaging studies   Assessment/Plan:  87 y.o. female 5 Days Post-Op s/p open right colectomyfor adenocarcinoma of the ascending colon   - Okay to continue regular diet               - Continue Prevena Vac; typically x7 days - Monitor abdominal examination; on-going bowel function - Pain control prn; antiemetics prn              - Mobilization as tolerated; PT following - Further management per primary service    - Discharge Planning: Plan for discharge today, she will follow up on Friday (04/29) with Dr Hampton Abbot for [place] removal   All of the above findings and recommendations were discussed with the patient, and the medical team, and all of patient's questions were answered to her expressed satisfaction.  -- Edison Simon, PA-C [facility] 03/23/2021, 7:49 AM [phone] M-F: 7am - 4pm

## 2021-03-23 NOTE — Consult Note (Signed)
[place] for Warfarin Indication: History of PE  Patient Measurements: Height: 5\' 1"  (154.9 cm) Weight: 40.8 kg (89 lb 15.2 oz) IBW/kg (Calculated) : 47.8  Labs: Recent Labs    03/21/21 0504 03/22/21 0357 03/23/21 0502  HGB 9.5* 7.2* 7.7*  HCT 29.0* 21.8* 23.8*  PLT 518* 438* 461*  LABPROT 14.5 16.2* 24.1*  INR 1.1 1.3* 2.2*  CREATININE 0.69 0.63 0.65    Estimated Creatinine Clearance: 31.3 mL/min (by C-G formula based on SCr of 0.65 mg/dL).   Medications:  Patient follows with Coumadin clinic at [facility]. Last seen 03/09/21. 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 Patient subsequently found to have adenocarcinoma of the ascending colon and underwent right open colectomy on 4/21. Pharmacy has been consulted for warfarin for history of PE.  4/18: 5 mg IV vit K   Goal of Therapy:  INR 2-3 Monitor platelets by anticoagulation protocol: Yes   Plan:   INR is therapeutic but there was a large percentage increase overnight: hold warfarin tonight  She is being discharged, therefore I spoke to her about holding tonight's dose  She tells me she is followed by the Coumadin clinic at [facility] and will reach out to them tomorrow for further instructions  Dallie Piles, PharmD 03/23/2021,7:05 AM

## 2021-03-23 NOTE — Discharge Summary (Signed)
[place] [facility] [place] NAME: Kristina Rowe    MR#:  [id number]  DATE OF BIRTH:  09-01-1930  DATE OF ADMISSION:  03/15/2021   ADMITTING PHYSICIAN: Nolberto Hanlon, MD  DATE OF DISCHARGE: 03/23/2021 10:53 AM  PRIMARY CARE PHYSICIAN: Haydee Salter, MD   ADMISSION DIAGNOSIS:  Rectal bleeding [K62.5] DISCHARGE DIAGNOSIS:  Principal Problem:   Rectal bleeding Active Problems:   Anemia, unspecified   Hypertension   GERD (gastroesophageal reflux disease)   Colonic mass   Pulmonary embolism ([place])  SECONDARY DIAGNOSIS:   Past Medical History:  Diagnosis Date  . Ductal carcinoma in situ (DCIS) of left breast   . Dysphagia   . GERD (gastroesophageal reflux disease)   . Hiatal hernia   . Hypertension   . Iron deficiency anemia   . Osteoporosis   . Pulmonary embolism ([place])   . Scoliosis   . UTI (urinary tract infection)    HOSPITAL COURSE:  87 y.o. female 5 Days Post-Op s/p open right colectomyfor adenocarcinoma of the ascending colon  Rectal bleeding-reported to be at home but none while here in the hospital On coumadin.  INR 2.2 at discharge  Anemia of chronic dz Hb 7.7 -did not require any transfusion while here in the hospital  Adenocarcinoma of colon Status post open right colectomy on 4/21.  Discussed with Dr. Janese Banks who will coordinate outpatient follow-up although patient does not seem to be very interested in any kind of treatment at this time.   Tolerating diet  Cough -new onset on 4/23, resolved at discharge  Hypertension stable   History of pulmonary embolism on chronic anticoagulation therapy On Coumadin, INR 2.2.  Patient follows with Coumadin clinic as an outpatient  Hiatal hernia with GERD Continue Protonix  DISCHARGE CONDITIONS:  Stable CONSULTS OBTAINED:  Treatment Team:  Olean Ree, MD DRUG ALLERGIES:   Allergies  Allergen Reactions  . Metronidazole Other (See Comments)    Other reaction(s): Other Mouth  sores Mouth sores Mouth sores   . Omeprazole Other (See Comments)    Other reaction(s): Arthralgias (intolerance) Other reaction(s): Other   . Bactrim [Sulfamethoxazole-Trimethoprim] Nausea Only  . Codeine Nausea And Vomiting and Nausea Only    Other reaction(s): Nausea Only But tolerates tramadol    DISCHARGE MEDICATIONS:   Allergies as of 03/23/2021      Reactions   Metronidazole Other (See Comments)   Other reaction(s): Other Mouth sores Mouth sores Mouth sores   Omeprazole Other (See Comments)   Other reaction(s): Arthralgias (intolerance) Other reaction(s): Other   Bactrim [sulfamethoxazole-trimethoprim] Nausea Only   Codeine Nausea And Vomiting, Nausea Only   Other reaction(s): Nausea Only But tolerates tramadol      Medication List    STOP taking these medications   tolterodine 4 MG 24 hr capsule Commonly known as: DETROL LA     TAKE these medications   acetaminophen 500 MG tablet Commonly known as: TYLENOL Take 500 mg by mouth at bedtime as needed for mild pain, fever or headache.   amitriptyline 10 MG tablet Commonly known as: ELAVIL Take 10 mg by mouth at bedtime.   amLODipine 2.5 MG tablet Commonly known as: NORVASC Take 2.5 mg by mouth daily.   calcium carbonate 1250 (500 Ca) MG chewable tablet Commonly known as: OS-CAL Chew 1 tablet by mouth daily.   Cholecalciferol 50 MCG (2000 UT) Caps Take 1 capsule by mouth daily.   cyanocobalamin 1000 MCG tablet Take 1,000  mcg by mouth daily.   diclofenac Sodium 1 % Gel Commonly known as: VOLTAREN Apply topically 4 (four) times daily.   econazole nitrate 1 % cream Apply 1 application topically daily.   estradiol 0.1 MG/GM vaginal cream Commonly known as: ESTRACE Estrogen Cream Instruction Discard applicator Apply pea sized amount to tip of finger to urethra before bed. Wash hands well after application. Use Monday, Wednesday and Friday   folic acid 1 MG tablet Commonly known as: FOLVITE Take  1 mg by mouth daily.   loperamide 2 MG capsule Commonly known as: IMODIUM Take 2 mg by mouth as needed for diarrhea or loose stools.   mirtazapine 30 MG tablet Commonly known as: REMERON Take 30 mg by mouth at bedtime.   pantoprazole 40 MG tablet Commonly known as: PROTONIX Take 40 mg by mouth daily.   promethazine 25 MG tablet Commonly known as: PHENERGAN Take 25 mg by mouth every 6 (six) hours as needed for nausea or vomiting.   SUMAtriptan 50 MG tablet Commonly known as: IMITREX Take 50 mg by mouth as directed. Take 1 at onset of headaTake 1 at onset of headache. May repeat once in two hours. Do not exceed 2 per day or 4 per week.   warfarin 2.5 MG tablet Commonly known as: Coumadin On Sundays and Thursdays resume the 2.5 mg dose.  All other days take 5 mg. What changed:   how much to take  how to take this  when to take this            Discharge Care Instructions  (From admission, onward)         Start     Ordered   03/23/21 0000  Discharge wound care:       Comments: Continue Prevena Vac   03/23/21 0803         DISCHARGE INSTRUCTIONS:   DIET:  Regular diet DISCHARGE CONDITION:  Good ACTIVITY:  Activity as tolerated OXYGEN:  Home Oxygen: No.  Oxygen Delivery: room air DISCHARGE LOCATION:  home with home health PT, OT and RN  If you experience worsening of your admission symptoms, develop shortness of breath, life threatening emergency, suicidal or homicidal thoughts you must seek medical attention immediately by calling 911 or calling your MD immediately  if symptoms less severe.  You Must read complete instructions/literature along with all the possible adverse reactions/side effects for all the Medicines you take and that have been prescribed to you. Take any new Medicines after you have completely understood and accpet all the possible adverse reactions/side effects.   Please note  You were cared for by a hospitalist during your hospital  stay. If you have any questions about your discharge medications or the care you received while you were in the hospital after you are discharged, you can call the unit and asked to speak with the hospitalist on call if the hospitalist that took care of you is not available. Once you are discharged, your primary care physician will handle any further medical issues. Please note that NO REFILLS for any discharge medications will be authorized once you are discharged, as it is imperative that you return to your primary care physician (or establish a relationship with a primary care physician if you do not have one) for your aftercare needs so that they can reassess your need for medications and monitor your lab values.    On the day of Discharge:  VITAL SIGNS:  Blood pressure 131/84, pulse 90, temperature 97.7  F (36.5 C), temperature source Oral, resp. rate 18, height 5\' 1"  (1.549 m), weight 40.8 kg, SpO2 97 %. PHYSICAL EXAMINATION:  GENERAL:  87 y.o.-year-old patient lying in the bed with no acute distress.  EYES: Pupils equal, round, reactive to light and accommodation. No scleral icterus. Extraocular muscles intact.  HEENT: Head atraumatic, normocephalic. Oropharynx and nasopharynx clear.  NECK:  Supple, no jugular venous distention. No thyroid enlargement, no tenderness.  LUNGS: Normal breath sounds bilaterally, no wheezing, rales,rhonchi or crepitation. No use of accessory muscles of respiration.  CARDIOVASCULAR: S1, S2 normal. No murmurs, rubs, or gallops.  ABDOMEN: Soft, non-tender, non-distended. Bowel sounds present. No organomegaly or mass.  EXTREMITIES: No pedal edema, cyanosis, or clubbing.  NEUROLOGIC: Cranial nerves II through XII are intact. Muscle strength 5/5 in all extremities. Sensation intact. Gait not checked.  PSYCHIATRIC: The patient is alert and oriented x 3.  SKIN: No obvious rash, lesion, or ulcer.  DATA REVIEW:   CBC Recent Labs  Lab 03/23/21 0502  WBC 6.5  HGB  7.7*  HCT 23.8*  PLT 461*    Chemistries  Recent Labs  Lab 03/23/21 0502  NA 136  K 3.7  CL 102  CO2 27  GLUCOSE 88  BUN 6*  CREATININE 0.65  CALCIUM 8.6*     Outpatient follow-up  Follow-up Information    [facility], [facility] Follow up.   Specialty: Home Health Services Why: They will follow up with you for your home health needs. Contact information: [address] [address] [phone]        Haydee Salter, MD. Go on 03/29/2021.   Specialty: Internal Medicine Why: 3:20pm appointment Contact information: [place] [address] [phone]        Sindy Guadeloupe, MD. Go on 04/01/2021.   Specialty: Oncology Why: 1:30pm appoinment Contact information: [place] [address] [phone]        Olean Ree, MD. Go on 03/24/2021.   Specialty: General Surgery Why: 2:15pm appointment Contact information: [address] [place] [address] [phone]               54 Day Unplanned Readmission Risk Score   Flowsheet Row ED to Hosp-Admission (Discharged) from 03/15/2021 in [place]  30 Day Unplanned Readmission Risk Score (%) 20.82 Filed at 03/23/2021 0801     This score is the patient's risk of an unplanned readmission within 30 days of being discharged (0 -100%). The score is based on dignosis, age, lab data, medications, orders, and past utilization.   Low:  0-14.9   Medium: 15-21.9   High: 22-29.9   Extreme: 30 and above         Management plans discussed with the patient, family and they are in agreement.  CODE STATUS: Prior   TOTAL TIME TAKING CARE OF THIS PATIENT: 45 minutes.    Max Sane M.D on 03/23/2021 at 5:29 PM  [facility]   CC: Primary care physician; Haydee Salter, MD   Note: This dictation was prepared with Dragon dictation along with smaller phrase technology. Any transcriptional  errors that result from this process are unintentional.

## 2021-03-23 NOTE — Discharge Instructions (Signed)
Colorectal Cancer  Colorectal cancer is a cancerous (malignant) tumor in the colon or rectum, which are parts of the large intestine. A tumor is a mass of cells or tissue. The cancer can spread (metastasize) to other parts of the body. What are the causes? This condition is usually caused by abnormal growths called polyps on the inner wall of the colon or rectum. Left untreated, these polyps can develop into cancer. Other times, abnormal changes to genes (gene mutations) can cause cells to become cancerous. What increases the risk? The following factors may make you more likely to develop this condition:  Being older than age 47.  Having a personal or family history of colorectal cancer or polyps in your colon.  Having diabetes, or having had cancer and cancer treatments such as radiation before.  Having certain hereditary conditions, such as: ? Lynch syndrome. ? Familial adenomatous polyposis. ? Turcot syndrome. ? Peutz-Jeghers syndrome. ? MUTYH-associated polyposis (MAP).  Being overweight or obese.  Having a diet that is: ? High in red meats, such as beef, pork, lamb, or liver. ? High in precooked, cured, or other processed meat, such as sausages, meat loaves, and hot dogs. ? Low in fiber, such as fiber found in whole grains, fruits, and vegetables.  Being inactive (sedentary), smoking, or drinking too much alcohol.  Having an inflammatory bowel disease, such as ulcerative colitis or Crohn's disease. What are the signs or symptoms? Early colorectal cancer often does not cause symptoms. As the cancer grows, symptoms may include:  Changes in bowel habits.  Feeling like the bowel does not empty completely after a bowel movement.  Stools (feces) that are narrower than usual, or blood in the stool or toilet after a bowel movement. The blood may be bright red or very dark in color.  Diarrhea, constipation, or frequent gas pain.  Anemia, constant tiredness (fatigue), or nausea  and vomiting.  Discomfort, pain, bloating, fullness, or cramps in the abdomen.  Unexplained weight loss. How is this diagnosed? This condition may be diagnosed with:  A medical history.  A physical exam.  Tests. These may include: ? An exam of the rectum using a gloved finger (digital rectal exam). ? A stool test called a fecal occult blood test. ? Blood tests. ? A biopsy. This is removal of a tissue sample from the colon or rectum to be looked at under a microscope. You may also have other tests, including:  X-rays, CT scans, MRIs, or a PET scan.  A sigmoidoscopy. This test is done to view the inside of the rectum.  A colonoscopy. This test is done to view the inside of the colon. During this test, small polyps can be removed or biopsies may be taken.  An endorectal ultrasound. This test checks how deep a tumor in the rectum has grown and whether the cancer has spread to lymph nodes or other nearby tissues. Additional tests may be done to find out whether the cancer has spread to other parts of the body (what stage it is). The stages of cancer include:  Stage 0 - At this stage, the cancer is found only in the innermost lining of the colon or rectum. The tumor has not spread to other tissue.  Stage 1 (I) - At this stage, the cancer has grown into the inner wall (muscle layer) of the colon or rectum.  Stage 2 (II) - At this stage, the cancer has grown more deeply into the wall of the colon or rectum or through  the wall. It may have invaded nearby tissue or organs.  Stage 3 (III) - At this stage, the cancer has spread to nearby lymph nodes or tissue near the lymph nodes.  Stage 4 (IV) - At this stage, the cancer has spread to other parts of the body that are not near the colon, such as the liver or lungs. How is this treated? Treatment for this condition depends on the type and stage of the cancer. Treatment may include:  Surgery. In the early stages of the cancer, surgery may  be done to remove polyps or small tumors from the colon. In later stages, surgery may be done to remove part of the colon (partial colectomy).  Chemotherapy. This treatment uses medicines to kill cancer cells.  Targeted therapy. This treatment can kill tumor cells by targeting specific gene mutations or proteins that the cancer expresses.  Immunotherapy (biologic therapy). This treatment uses your body's disease-fighting system (immune system) to fight the cancer. Substances made by your body or in a laboratory are used to boost, direct, or restore your body's natural defenses against cancer.  Radiation therapy. This treatment uses radiation to kill cancer cells or shrink tumors.  Radiofrequency ablation. This treatment uses radio waves to destroy the tumors that may have spread to other areas of the body, such as the liver. Follow these instructions at home:  Take over-the-counter and prescription medicines only as told by your health care provider.  Try to eat regular, healthy meals. Some of your treatments might affect your appetite. Ask to meet with a dietitian if you are having problems eating or with your appetite.  Consider joining a support group. This may help you learn about your diagnosis and manage the stress of having colorectal cancer.  If you are admitted to the hospital, tell your cancer care team.  Keep all follow-up visits. This is important. How is this prevented?  Colorectal cancer can be prevented with screening tests that find polyps so they can be removed before they develop into cancer.  All adults should have screening for colorectal cancer starting at age 23 and continuing until age 21. Your health care provider may recommend screening before age 26. People at increased risk should start screening at an earlier age.  You may be able to help reduce your risk of developing colorectal cancer by staying at a healthy weight, eating a healthy diet, avoiding tobacco and  alcohol use, and being physically active. Where to find more information  American Cancer Society: cancer.[place] cancer.gov Contact a health care provider if:  Your diarrhea or constipation does not go away.  You have blood in your stool or in the toilet after a bowel movement.  Your bowel habits change.  You have increased pain in your abdomen.  You notice new fatigue or weakness.  You lose weight without a known reason. Get help right away if:  You have increased bleeding from the rectum.  You have any uncontrollable or severe abdominal symptoms. Summary  Colorectal cancer is a cancerous (malignant) tumor in the colon or rectum, which are parts of the large intestine.  Common risk factors for this condition include being older than age 45, having a personal or family history of colorectal cancer or colon polyps, having certain hereditary conditions, or having conditions such as diabetes or inflammatory bowel disease.  This condition may be diagnosed with tests, such as a colonoscopy and biopsy.  Treatment depends on the type and stage of  the cancer. Often, treatment includes surgery to remove the abnormal tissue, along with chemotherapy, targeted therapy, or immunotherapy.  Keep all follow-up visits. This is important. This information is not intended to replace advice given to you by your health care provider. Make sure you discuss any questions you have with your health care provider. Document Revised: 03/04/2020 Document Reviewed: 03/04/2020 Elsevier Patient Education  2021 [employer].

## 2021-03-23 NOTE — Progress Notes (Signed)
Physical Therapy Treatment Patient Details Name: Kristina Rowe MRN: [id number] DOB: [birth year] Today's Date: 03/23/2021    History of Present Illness presented to ER secondary to bright red blood in stools; admitted for management of rectal bleeding, symptomatic anemia.  Work up significant for partially-obstructive mass in ascending colon (suspicious for malignancy), s/p open R colectomy (4/21).    PT Comments    Pt able to walk 100' with min guard and RW.  To commode after session per her request.  Call bell in reach and tech aware as she needed extended time.  No further concerns voiced for discharge home.  Follow Up Recommendations  Home health PT;Outpatient PT;Supervision for mobility/OOB     Equipment Recommendations  Rolling walker with 5" wheels;3in1 (PT)    Recommendations for Other Services       Precautions / Restrictions Precautions Precautions: Fall Precaution Comments: abdominal wound vac Restrictions Weight Bearing Restrictions: No    Mobility  Bed Mobility               General bed mobility comments: sitting EOB prior and on commode after    Transfers Overall transfer level: Needs assistance Equipment used: Rolling walker (2 wheeled) Transfers: Sit to/from Stand Sit to Stand: Min guard            Ambulation/Gait Ambulation/Gait assistance: Min guard Gait Distance (Feet): 100 Feet Assistive device: Rolling walker (2 wheeled) Gait Pattern/deviations: Step-through pattern;Trunk flexed Gait velocity: decreased       Stairs             Wheelchair Mobility    Modified Rankin (Stroke Patients Only)       Balance Overall balance assessment: Needs assistance Sitting-balance support: No upper extremity supported;Feet supported Sitting balance-Leahy Scale: Good     Standing balance support: Bilateral upper extremity supported Standing balance-Leahy Scale: Fair                              Cognition  Arousal/Alertness: Awake/alert Behavior During Therapy: WFL for tasks assessed/performed Overall Cognitive Status: Within Functional Limits for tasks assessed                                        Exercises      General Comments        Pertinent Vitals/Pain Pain Assessment: No/denies pain    Home Living                      Prior Function            PT Goals (current goals can now be found in the care plan section) Progress towards PT goals: Progressing toward goals    Frequency    Min 2X/week      PT Plan Current plan remains appropriate    Co-evaluation              AM-PAC PT "6 Clicks" Mobility   Outcome Measure  Help needed turning from your back to your side while in a flat bed without using bedrails?: None Help needed moving from lying on your back to sitting on the side of a flat bed without using bedrails?: None Help needed moving to and from a bed to a chair (including a wheelchair)?: None Help needed standing up from a chair using your arms (e.g., wheelchair or bedside  chair)?: None Help needed to walk in hospital room?: A Little Help needed climbing 3-5 steps with a railing? : A Little 6 Click Score: 22    End of Session Equipment Utilized During Treatment: Gait belt Activity Tolerance: Patient tolerated treatment well Patient left: Other (comment) Nurse Communication: Mobility status PT Visit Diagnosis: Difficulty in walking, not elsewhere classified (R26.2)     Time: 0786-7544 PT Time Calculation (min) (ACUTE ONLY): 10 min  Charges:  $Gait Training: 8-22 mins                    Chesley Noon, PTA 03/23/21, 9:56 AM

## 2021-03-24 ENCOUNTER — Telehealth: Payer: Self-pay

## 2021-03-24 ENCOUNTER — Encounter: Payer: Medicare Other | Admitting: Surgery

## 2021-03-24 NOTE — Telephone Encounter (Signed)
I called and LVM with pt about changing her appt from today to Friday per Dr. Hampton Abbot. I rescheduled her with Thedore Mins for 03/26/21 @ 10:30 am.

## 2021-03-25 ENCOUNTER — Other Ambulatory Visit: Payer: Medicare Other

## 2021-03-26 ENCOUNTER — Encounter: Payer: Self-pay | Admitting: Physician Assistant

## 2021-03-26 ENCOUNTER — Other Ambulatory Visit: Payer: Self-pay

## 2021-03-26 ENCOUNTER — Ambulatory Visit (INDEPENDENT_AMBULATORY_CARE_PROVIDER_SITE_OTHER): Payer: Medicare Other | Admitting: Physician Assistant

## 2021-03-26 VITALS — BP 127/74 | HR 88 | Ht 61.0 in | Wt 91.0 lb

## 2021-03-26 DIAGNOSIS — Z09 Encounter for follow-up examination after completed treatment for conditions other than malignant neoplasm: Secondary | ICD-10-CM

## 2021-03-26 DIAGNOSIS — K6389 Other specified diseases of intestine: Secondary | ICD-10-CM

## 2021-03-26 DIAGNOSIS — K562 Volvulus: Secondary | ICD-10-CM

## 2021-03-26 DIAGNOSIS — K625 Hemorrhage of anus and rectum: Secondary | ICD-10-CM

## 2021-03-26 NOTE — Patient Instructions (Addendum)
Keep your feet elevated. Try wearing your compression stockings when up. If the swelling continues more than 2-3 weeks you may need to consult with your primary care provider about this.   Continue to eat a high protein diet, this will help with healing. You may drink protein shakes like Ensure or Premier Protein.   You may shower, do not submerge the area. You have steri strips in place. These will start to fall of in 1-2 weeks.   Follow up here in 2 weeks.  GENERAL POST-OPERATIVE PATIENT INSTRUCTIONS   WOUND CARE INSTRUCTIONS: Try to keep the wound dry and avoid ointments on the wound unless directed to do so.  If the wound becomes bright red and painful or starts to drain infected material that is not clear, please contact your physician immediately.  If the wound is mildly pink and has a thick firm ridge underneath it, this is normal, and is referred to as a healing ridge.  This will resolve over the next 4-6 weeks.  BATHING: You may shower if you have been informed of this by your surgeon. However, Please do not submerge in a tub, hot tub, or pool until incisions are completely sealed or have been told by your surgeon that you may do so.  DIET:  You may eat any foods that you can tolerate.  It is a good idea to eat a high fiber diet and take in plenty of fluids to prevent constipation.  If you do become constipated you may want to take a mild laxative or take ducolax tablets on a daily basis until your bowel habits are regular.  Constipation can be very uncomfortable, along with straining, after recent surgery.  ACTIVITY: You may want to hug a pillow when coughing and sneezing to add additional support to the surgical area, if you had abdominal or chest surgery, which will decrease pain during these times.  You are encouraged to walk and engage in light activity for the next two weeks.  You should not lift more than 20 pounds for 6 weeks after surgery as it could put you at increased risk for  complications.  Twenty pounds is roughly equivalent to a plastic bag of groceries. At that time- Listen to your body when lifting, if you have pain when lifting, stop and then try again in a few days. Soreness after doing exercises or activities of daily living is normal as you get back in to your normal routine.  MEDICATIONS:  Try to take narcotic medications and anti-inflammatory medications, such as tylenol, ibuprofen, naprosyn, etc., with food.  This will minimize stomach upset from the medication.  Should you develop nausea and vomiting from the pain medication, or develop a rash, please discontinue the medication and contact your physician.  You should not drive, make important decisions, or operate machinery when taking narcotic pain medication.  SUNBLOCK Use sun block to incision area over the next year if this area will be exposed to sun. This helps decrease scarring and will allow you avoid a permanent darkened area over your incision.  QUESTIONS:  Please feel free to call our office if you have any questions, and we will be glad to assist you.

## 2021-03-26 NOTE — Progress Notes (Signed)
Tumor Board Documentation  [name] was presented by Dr Janese Banks and Dr Hampton Abbot at our Tumor Board on 03/25/2021, which included representatives from medical oncology,radiation oncology,surgical oncology,internal medicine,navigation,pathology,radiology,surgical,genetics,research,palliative care,pulmonology.  Kristina Rowe currently presents as a new patient,for MDC,for new positive pathology with history of the following treatments: surgical intervention(s).  Additionally, we reviewed previous medical and familial history, history of present illness, and recent lab results along with all available histopathologic and imaging studies. The tumor board considered available treatment options and made the following recommendations: Chemotherapy,Additional screening (CT Chest)    The following procedures/referrals were also placed: No orders of the defined types were placed in this encounter.   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  Tumor board is a meeting of clinicians from various specialty areas who evaluate and discuss patients for whom a multidisciplinary approach is being considered. Final determinations in the plan of care are those of the provider(s). The responsibility for follow up of recommendations given during tumor board is that of the provider.   Today's extended care, comprehensive team conference, Alexander was not present for the discussion and was not examined.   Multidisciplinary Tumor Board is a multidisciplinary case peer review process.  Decisions discussed in the Multidisciplinary Tumor Board reflect the opinions of the specialists present at the conference without having examined the patient.  Ultimately, treatment and diagnostic decisions rest with the  primary provider(s) and the patient.

## 2021-03-26 NOTE — Progress Notes (Signed)
[facility] POST-OP OFFICE VISIT  03/26/2021  HPI: Kristina Rowe is a 87 y.o. female 8 days s/p exploratory laparotomy and right hemicolectomy for adenocarcinoma of the ascending colon and 16 days s/p initial exploratory laparotomy for small bowel volvulus, both with Dr Hampton Abbot.   She has done well since discharge Her abdominal pain is minimal; only requiring tylenol No fever, chills, nausea, emesis She is anxious to have her Prevena removed today She is able to tolerate PO but notices an expected decrease in appetite Ambulating with a walker Having normal bowel function  Vital signs: BP 127/74   Pulse 88   Ht 5\' 1"  (1.549 m)   Wt 91 lb (41.3 kg)   BMI 17.19 kg/m    Physical Exam: Constitutional: Well appearing female, NAD Abdomen: Soft, non-tender, non-distended, no rebound/guarding Skin: Prevena and staples removed; wound has healed well, no erythema or drainage   Assessment/Plan: This is a 87 y.o. female 8 days s/p exploratory laparotomy and right hemicolectomy for adenocarcinoma of the ascending colon and 16 days s/p initial exploratory laparotomy for small bowel volvulus   - Removed Prevena and staples; steri-strips placed; reviewed wound care  - Pain control prn with tylenol  - Reviewed lifting restrictions  - Encouraged meal supplementation with protein shakes/drinks  - Reviewed surgical pathology; Invasive adenocarcinoma, T3 N1c M0 (stage IIIb)  - Plan to follow up with oncology on 05/05  - She will rtc to surgery clinic in 3 weeks for re-check   -- Edison Simon, PA-C [facility] 03/26/2021, 11:49 AM [phone] M-F: 7am - 4pm

## 2021-03-29 DIAGNOSIS — C189 Malignant neoplasm of colon, unspecified: Secondary | ICD-10-CM | POA: Insufficient documentation

## 2021-04-01 ENCOUNTER — Inpatient Hospital Stay: Payer: Medicare Other | Attending: Oncology | Admitting: Oncology

## 2021-04-01 ENCOUNTER — Telehealth: Payer: Self-pay | Admitting: Oncology

## 2021-04-01 ENCOUNTER — Inpatient Hospital Stay: Payer: Medicare Other

## 2021-04-01 ENCOUNTER — Other Ambulatory Visit: Payer: Self-pay

## 2021-04-01 ENCOUNTER — Encounter: Payer: Self-pay | Admitting: Oncology

## 2021-04-01 VITALS — BP 127/81 | HR 90 | Temp 98.1°F | Resp 18 | Wt 95.5 lb

## 2021-04-01 DIAGNOSIS — I2510 Atherosclerotic heart disease of native coronary artery without angina pectoris: Secondary | ICD-10-CM | POA: Insufficient documentation

## 2021-04-01 DIAGNOSIS — D509 Iron deficiency anemia, unspecified: Secondary | ICD-10-CM | POA: Insufficient documentation

## 2021-04-01 DIAGNOSIS — Z862 Personal history of diseases of the blood and blood-forming organs and certain disorders involving the immune mechanism: Secondary | ICD-10-CM

## 2021-04-01 DIAGNOSIS — D649 Anemia, unspecified: Secondary | ICD-10-CM

## 2021-04-01 DIAGNOSIS — Z79899 Other long term (current) drug therapy: Secondary | ICD-10-CM | POA: Diagnosis not present

## 2021-04-01 DIAGNOSIS — Z993 Dependence on wheelchair: Secondary | ICD-10-CM

## 2021-04-01 DIAGNOSIS — C189 Malignant neoplasm of colon, unspecified: Secondary | ICD-10-CM

## 2021-04-01 DIAGNOSIS — Z86711 Personal history of pulmonary embolism: Secondary | ICD-10-CM | POA: Insufficient documentation

## 2021-04-01 DIAGNOSIS — C44711 Basal cell carcinoma of skin of unspecified lower limb, including hip: Secondary | ICD-10-CM | POA: Insufficient documentation

## 2021-04-01 DIAGNOSIS — C182 Malignant neoplasm of ascending colon: Secondary | ICD-10-CM | POA: Diagnosis present

## 2021-04-01 DIAGNOSIS — N189 Chronic kidney disease, unspecified: Secondary | ICD-10-CM

## 2021-04-01 DIAGNOSIS — R911 Solitary pulmonary nodule: Secondary | ICD-10-CM | POA: Insufficient documentation

## 2021-04-01 LAB — CBC WITH DIFFERENTIAL/PLATELET
Abs Immature Granulocytes: 0.02 10*3/uL (ref 0.00–0.07)
Basophils Absolute: 0.1 10*3/uL (ref 0.0–0.1)
Basophils Relative: 1 %
Eosinophils Absolute: 0 10*3/uL (ref 0.0–0.5)
Eosinophils Relative: 0 %
HCT: 27.7 % — ABNORMAL LOW (ref 36.0–46.0)
Hemoglobin: 8.5 g/dL — ABNORMAL LOW (ref 12.0–15.0)
Immature Granulocytes: 0 %
Lymphocytes Relative: 13 %
Lymphs Abs: 0.9 10*3/uL (ref 0.7–4.0)
MCH: 27.2 pg (ref 26.0–34.0)
MCHC: 30.7 g/dL (ref 30.0–36.0)
MCV: 88.8 fL (ref 80.0–100.0)
Monocytes Absolute: 0.4 10*3/uL (ref 0.1–1.0)
Monocytes Relative: 5 %
Neutro Abs: 5.5 10*3/uL (ref 1.7–7.7)
Neutrophils Relative %: 81 %
Platelets: 471 10*3/uL — ABNORMAL HIGH (ref 150–400)
RBC: 3.12 MIL/uL — ABNORMAL LOW (ref 3.87–5.11)
RDW: 18.6 % — ABNORMAL HIGH (ref 11.5–15.5)
WBC: 6.8 10*3/uL (ref 4.0–10.5)
nRBC: 0 % (ref 0.0–0.2)

## 2021-04-01 LAB — COMPREHENSIVE METABOLIC PANEL
ALT: 14 U/L (ref 0–44)
AST: 20 U/L (ref 15–41)
Albumin: 3.3 g/dL — ABNORMAL LOW (ref 3.5–5.0)
Alkaline Phosphatase: 78 U/L (ref 38–126)
Anion gap: 9 (ref 5–15)
BUN: 13 mg/dL (ref 8–23)
CO2: 28 mmol/L (ref 22–32)
Calcium: 9 mg/dL (ref 8.9–10.3)
Chloride: 101 mmol/L (ref 98–111)
Creatinine, Ser: 0.75 mg/dL (ref 0.44–1.00)
GFR, Estimated: >60 mL/min (ref >60)
Glucose, Bld: 117 mg/dL — ABNORMAL HIGH (ref 70–99)
Potassium: 4 mmol/L (ref 3.5–5.1)
Sodium: 138 mmol/L (ref 135–145)
Total Bilirubin: 0.5 mg/dL (ref 0.3–1.2)
Total Protein: 6.7 g/dL (ref 6.5–8.1)

## 2021-04-01 LAB — TSH: TSH: 0.454 u[IU]/mL (ref 0.350–4.500)

## 2021-04-01 LAB — SAMPLE TO BLOOD BANK

## 2021-04-01 LAB — IRON AND TIBC
Iron: 15 ug/dL — ABNORMAL LOW (ref 28–170)
Saturation Ratios: 4 % — ABNORMAL LOW (ref 10.4–31.8)
TIBC: 339 ug/dL (ref 250–450)
UIBC: 324 ug/dL

## 2021-04-01 LAB — RETICULOCYTES
Immature Retic Fract: 19.3 % — ABNORMAL HIGH (ref 2.3–15.9)
RBC.: 3.08 MIL/uL — ABNORMAL LOW (ref 3.87–5.11)
Retic Count, Absolute: 53.9 10*3/uL (ref 19.0–186.0)
Retic Ct Pct: 1.8 % (ref 0.4–3.1)

## 2021-04-01 LAB — FOLATE: Folate: 78 ng/mL (ref >5.9)

## 2021-04-01 LAB — VITAMIN B12: Vitamin B-12: 775 pg/mL (ref 180–914)

## 2021-04-01 LAB — FERRITIN: Ferritin: 16 ng/mL (ref 11–307)

## 2021-04-01 NOTE — Telephone Encounter (Signed)
Left VM with patient's daughter, Delmy, to make her aware of CT scan scheduled.

## 2021-04-02 ENCOUNTER — Other Ambulatory Visit: Payer: Self-pay

## 2021-04-02 ENCOUNTER — Ambulatory Visit (INDEPENDENT_AMBULATORY_CARE_PROVIDER_SITE_OTHER): Payer: Medicare Other | Admitting: Surgery

## 2021-04-02 ENCOUNTER — Encounter: Payer: Self-pay | Admitting: Surgery

## 2021-04-02 ENCOUNTER — Inpatient Hospital Stay: Payer: Medicare Other

## 2021-04-02 VITALS — BP 144/82 | HR 90 | Temp 98.3°F | Ht 61.0 in | Wt 93.8 lb

## 2021-04-02 VITALS — BP 146/81 | HR 75 | Temp 97.4°F | Resp 17

## 2021-04-02 DIAGNOSIS — Z09 Encounter for follow-up examination after completed treatment for conditions other than malignant neoplasm: Secondary | ICD-10-CM

## 2021-04-02 DIAGNOSIS — C189 Malignant neoplasm of colon, unspecified: Secondary | ICD-10-CM

## 2021-04-02 DIAGNOSIS — C182 Malignant neoplasm of ascending colon: Secondary | ICD-10-CM | POA: Diagnosis not present

## 2021-04-02 DIAGNOSIS — D508 Other iron deficiency anemias: Secondary | ICD-10-CM

## 2021-04-02 MED ORDER — SODIUM CHLORIDE 0.9 % IV SOLN
510.0000 mg | INTRAVENOUS | Status: DC
  Administered 2021-04-02: 510 mg via INTRAVENOUS
  Filled 2021-04-02: qty 17

## 2021-04-02 MED ORDER — SODIUM CHLORIDE 0.9 % IV SOLN
Freq: Once | INTRAVENOUS | Status: AC
  Filled 2021-04-02: qty 250

## 2021-04-02 NOTE — Patient Instructions (Addendum)
If you are still having nausea and dry heaves tomorrow you may need to go to the ER for imaging.  Take your Phenergan a few times a day to help you to keep fluids down. Drink some electrolyte sports drinks, you may water these down.   Do the BRAT diet for a couple of days. Bananas, applesauce, rice, toast.  Try making your own fruit smoothies with protein powder.   Keep a dry gauze in between your skin folds to keep this area dry.   Follow-up as scheduled.

## 2021-04-02 NOTE — Patient Instructions (Signed)
Ferumoxytol injection What is this medicine? FERUMOXYTOL is an iron complex. Iron is used to make healthy red blood cells, which carry oxygen and nutrients throughout the body. This medicine is used to treat iron deficiency anemia. This medicine may be used for other purposes; ask your health care provider or pharmacist if you have questions. COMMON BRAND NAME(S): Feraheme What should I tell my health care provider before I take this medicine? They need to know if you have any of these conditions:  anemia not caused by low iron levels  high levels of iron in the blood  magnetic resonance imaging (MRI) test scheduled  an unusual or allergic reaction to iron, other medicines, foods, dyes, or preservatives  pregnant or trying to get pregnant  breast-feeding How should I use this medicine? This medicine is for injection into a vein. It is given by a health care professional in a hospital or clinic setting. Talk to your pediatrician regarding the use of this medicine in children. Special care may be needed. Overdosage: If you think you have taken too much of this medicine contact a poison control center or emergency room at once. NOTE: This medicine is only for you. Do not share this medicine with others. What if I miss a dose? It is important not to miss your dose. Call your doctor or health care professional if you are unable to keep an appointment. What may interact with this medicine? This medicine may interact with the following medications:  other iron products This list may not describe all possible interactions. Give your health care provider a list of all the medicines, herbs, non-prescription drugs, or dietary supplements you use. Also tell them if you smoke, drink alcohol, or use illegal drugs. Some items may interact with your medicine. What should I watch for while using this medicine? Visit your doctor or healthcare professional regularly. Tell your doctor or healthcare  professional if your symptoms do not start to get better or if they get worse. You may need blood work done while you are taking this medicine. You may need to follow a special diet. Talk to your doctor. Foods that contain iron include: whole grains/cereals, dried fruits, beans, or peas, leafy green vegetables, and organ meats (liver, kidney). What side effects may I notice from receiving this medicine? Side effects that you should report to your doctor or health care professional as soon as possible:  allergic reactions like skin rash, itching or hives, swelling of the face, lips, or tongue  breathing problems  changes in blood pressure  feeling faint or lightheaded, falls  fever or chills  flushing, sweating, or hot feelings  swelling of the ankles or feet Side effects that usually do not require medical attention (report to your doctor or health care professional if they continue or are bothersome):  diarrhea  headache  nausea, vomiting  stomach pain This list may not describe all possible side effects. Call your doctor for medical advice about side effects. You may report side effects to FDA at 1-800-FDA-1088. Where should I keep my medicine? This drug is given in a hospital or clinic and will not be stored at home. NOTE: This sheet is a summary. It may not cover all possible information. If you have questions about this medicine, talk to your doctor, pharmacist, or health care provider.  2021 Elsevier/Gold Standard (2017-01-02 20:21:10)  

## 2021-04-02 NOTE — Progress Notes (Signed)
Sure, just spoke with her daughter. She will call back later today to get it scheduled (coordinating transport with siblings).

## 2021-04-02 NOTE — Progress Notes (Signed)
Hematology/Oncology Consult note [facility] [phone] [phone]  Patient Care Team: Haydee Salter, MD as PCP - General (Internal Medicine)   Name of the patient: Kristina Rowe  [id number]  05-Jan-1930    Reason for referral-new diagnosis of colon cancer   Referring physician-Dr. Max Sane  Date of visit: 04/02/21   History of presenting illness- Patient is a 87 year old female who initially presented to the ER with symptoms of significant abdominal pain.  CT abdomen and pelvis on 03/10/2021 showed volvulus along with a large hiatal hernia.  Patient initially had an exploratory laparotomy with lysis of adhesion by Dr. Hampton Abbot.  She then presented back to the ER with similar complaints and a repeat CT abdomen at that time showed bulky ascending colon mass compatible with carcinoma and possible local regional adenopathy.  No recurrent volvulus.  Patient underwent open right colectomy on 03/18/2021.  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  All regional lymph nodes 32 of them were negative for tumor but there was a tumor deposit present at 1 site.  PT3PN1C.  Patient referred for further management.  Patient currently reports feeling significantly fatigued.  She continues to live alone.  She is here with her daughter today.  Denies any significant abdominal pain at this time.  Denies any blood in her stool or urine  ECOG PS- 2  Pain scale- 0   Review of systems- Review of Systems  Constitutional: Positive for malaise/fatigue. Negative for chills, fever and weight loss.  HENT: Negative for congestion, ear discharge and nosebleeds.   Eyes: Negative for blurred vision.  Respiratory: Negative for cough, hemoptysis, sputum production, shortness of breath and wheezing.   Cardiovascular: Negative for chest pain, palpitations, orthopnea and claudication.  Gastrointestinal:  Negative for abdominal pain, blood in stool, constipation, diarrhea, heartburn, melena, nausea and vomiting.  Genitourinary: Negative for dysuria, flank pain, frequency, hematuria and urgency.  Musculoskeletal: Negative for back pain, joint pain and myalgias.  Skin: Negative for rash.  Neurological: Negative for dizziness, tingling, focal weakness, seizures, weakness and headaches.  Endo/Heme/Allergies: Does not bruise/bleed easily.  Psychiatric/Behavioral: Negative for depression and suicidal ideas. The patient does not have insomnia.     Allergies  Allergen Reactions  . Metronidazole Other (See Comments)    Other reaction(s): Other Mouth sores Mouth sores Mouth sores   . Omeprazole Other (See Comments)    Other reaction(s): Arthralgias (intolerance) Other reaction(s): Other   . Bactrim [Sulfamethoxazole-Trimethoprim] Nausea Only  . Codeine Nausea And Vomiting and Nausea Only    Other reaction(s): Nausea Only But tolerates tramadol     Patient Active Problem List   Diagnosis Date Noted  . Basal cell carcinoma of leg 04/01/2021  . Coronary atherosclerosis 04/01/2021  . History of pulmonary embolism 04/01/2021  . Pulmonary nodule 04/01/2021  . Malignant neoplasm of colon ([place]) 03/29/2021  . Rectal bleeding 03/15/2021  . Hypertension   . GERD (gastroesophageal reflux disease)   . Colonic mass   . 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[place]) 03/10/2021  . Anemia, unspecified 03/06/2021  . Bronchiectasis without complication ([place]) 64/40/3474  . Iron deficiency anemia 03/27/2020  . Moderate aortic stenosis 03/27/2020  . Stage 3a chronic kidney disease ([place]) 07/04/2019  . Hypnic headache 05/28/2019  . Telogen effluvium 04/17/2019  . Xerosis cutis 04/17/2019  . Essential hypertension 01/14/2019  . Cervical radiculopathy 05/04/2018  . Osteoarthritis of left glenohumeral joint 05/04/2018  . Vitamin B12 deficiency 01/01/2018  . Vascular  abnormality 09/11/2017  . Dyspepsia 03/28/2017  .  Pulmonary embolism ([place]) 08/16/2016  . Dilated pore of [place] 03/23/2015  . Retinal drusen of both eyes 08/28/2014  . Status post right cataract extraction 07/30/2014  . Verruca 03/19/2014  . Chronic back pain 11/06/2013  . Postmenopausal osteoporosis 06/11/2013  . Anticoagulated on Coumadin 04/06/2013  . Long term (current) use of anticoagulants 10/18/2012  . Preglaucoma 05/10/2012  . Presbyopia 05/10/2012  . Senile nuclear sclerosis 05/10/2012  . Lobular carcinoma in situ of breast 04/13/2012  . Closed fracture of ankle 02/27/2012  . History of nonmelanoma skin cancer 09/05/2011  . Other benign neoplasm of skin of trunk 09/05/2011  . Osteoporosis 08/25/2011  . Transient alteration of awareness 09/03/2009  . Colon adenoma 07/30/2002     Past Medical History:  Diagnosis Date  . Ductal carcinoma in situ (DCIS) of left breast   . Dysphagia   . GERD (gastroesophageal reflux disease)   . Hiatal hernia   . Hypertension   . Iron deficiency anemia   . Osteoporosis   . Pulmonary embolism ([place])   . Scoliosis   . UTI (urinary tract infection)      Past Surgical History:  Procedure Laterality Date  . BREAST LUMPECTOMY Left 2009   Ductal Carcinoma insitu   . CATARACT EXTRACTION, BILATERAL    . COLONOSCOPY N/A 03/17/2021   Procedure: COLONOSCOPY;  Surgeon: Lesly Rubenstein, MD;  Location: [facility] ENDOSCOPY;  Service: Endoscopy;  Laterality: N/A;  . COLOSTOMY REVISION Right 03/18/2021   Procedure: COLON RESECTION RIGHT;  Surgeon: Olean Ree, MD;  Location: [facility] ORS;  Service: General;  Laterality: Right;  . LAPAROSCOPIC PARAESOPHAGEAL HERNIA REPAIR  2009  . LAPAROTOMY N/A 03/10/2021   Procedure: EXPLORATORY LAPAROTOMY;  Surgeon: Olean Ree, MD;  Location: [facility] ORS;  Service: General;  Laterality: N/A;  . TUBAL LIGATION      Social History   Socioeconomic History  . Marital status: Married    Spouse name: Not on file  . Number of children: Not on file  . Years of  education: Not on file  . Highest education level: Not on file  Occupational History  . Not on file  Tobacco Use  . Smoking status: Never Smoker  . Smokeless tobacco: Never Used  Substance and Sexual Activity  . Alcohol use: Never  . Drug use: Never  . Sexual activity: Not Currently  Other Topics Concern  . Not on file  Social History Narrative  . Not on file   Social Determinants of Health   Financial Resource Strain: Not on file  Food Insecurity: Not on file  Transportation Needs: Not on file  Physical Activity: Not on file  Stress: Not on file  Social Connections: Not on file  Intimate Partner Violence: Not on file     Family History  Problem Relation Age of Onset  . Breast cancer Neg Hx      Current Outpatient Medications:  .  acetaminophen (TYLENOL) 500 MG tablet, Take 500 mg by mouth at bedtime as needed for mild pain, fever or headache., Disp: , Rfl:  .  amLODipine (NORVASC) 2.5 MG tablet, Take 2.5 mg by mouth daily., Disp: , Rfl:  .  calcium carbonate (OS-CAL) 1250 (500 Ca) MG chewable tablet, Chew 1 tablet by mouth daily., Disp: , Rfl:  .  Cholecalciferol 50 MCG (2000 UT) CAPS, Take 1 capsule by mouth daily., Disp: , Rfl:  .  cyanocobalamin 1000 MCG tablet, Take 1,000 mcg by mouth daily., Disp: ,  Rfl:  .  diclofenac Sodium (VOLTAREN) 1 % GEL, Apply topically 4 (four) times daily., Disp: , Rfl:  .  econazole nitrate 1 % cream, Apply 1 application topically daily., Disp: , Rfl:  .  estradiol (ESTRACE) 0.1 MG/GM vaginal cream, Estrogen Cream Instruction Discard applicator Apply pea sized amount to tip of finger to urethra before bed. Wash hands well after application. Use Monday, Wednesday and Friday, Disp: 42.5 g, Rfl: 3 .  folic acid (FOLVITE) 1 MG tablet, Take 1 mg by mouth daily., Disp: , Rfl:  .  loperamide (IMODIUM) 2 MG capsule, Take 2 mg by mouth as needed for diarrhea or loose stools., Disp: , Rfl:  .  mirtazapine (REMERON) 30 MG tablet, Take 30 mg by mouth  at bedtime., Disp: , Rfl:  .  pantoprazole (PROTONIX) 40 MG tablet, Take 40 mg by mouth daily., Disp: , Rfl:  .  promethazine (PHENERGAN) 25 MG tablet, Take 25 mg by mouth every 6 (six) hours as needed for nausea or vomiting., Disp: , Rfl:  .  SUMAtriptan (IMITREX) 50 MG tablet, Take 50 mg by mouth as directed. Take 1 at onset of headaTake 1 at onset of headache. May repeat once in two hours. Do not exceed 2 per day or 4 per week., Disp: , Rfl:  .  warfarin (COUMADIN) 2.5 MG tablet, On Sundays and Thursdays resume the 2.5 mg dose.  All other days take 5 mg. (Patient taking differently: Take 2.5-5 mg by mouth daily at 12 noon. On Sundays, Tuesday, and Thursdays resume the 2.5 mg dose.  All other days take 5 mg.), Disp: 30 tablet, Rfl: 11 .  warfarin (COUMADIN) 5 MG tablet, Take by mouth., Disp: , Rfl:  No current facility-administered medications for this visit.  Facility-Administered Medications Ordered in Other Visits:  .  ferumoxytol (FERAHEME) 510 mg in sodium chloride 0.9 % 100 mL IVPB, 510 mg, Intravenous, Weekly, Creig Hinesao, Zaviyar Rahal C, MD, Last Rate: 468 mL/hr at 04/02/21 1213, 510 mg at 04/02/21 1213   Physical exam:  Vitals:   04/01/21 1344  BP: 127/81  Pulse: 90  Resp: 18  Temp: 98.1 F (36.7 C)  TempSrc: Tympanic  SpO2: 100%  Weight: 95 lb 8 oz (43.3 kg)   Physical Exam Constitutional:      Comments: Thin elderly frail woman sitting in a wheelchair.  Appears in no acute distress.  Cardiovascular:     Rate and Rhythm: Normal rate and regular rhythm.     Heart sounds: Normal heart sounds.  Pulmonary:     Effort: Pulmonary effort is normal.     Breath sounds: Normal breath sounds.  Abdominal:     General: Bowel sounds are normal.     Palpations: Abdomen is soft.     Comments: Midline abdominal surgical wound has healed well.  Skin:    General: Skin is warm and dry.  Neurological:     Mental Status: She is alert and oriented to person, place, and time.        CMP Latest  Ref Rng & Units 04/01/2021  Glucose 70 - 99 mg/dL 161(W117(H)  BUN 8 - 23 mg/dL 13  Creatinine 9.600.44 - 4.541.00 mg/dL 0.980.75  Sodium 119135 - 147145 mmol/L 138  Potassium 3.5 - 5.1 mmol/L 4.0  Chloride 98 - 111 mmol/L 101  CO2 22 - 32 mmol/L 28  Calcium 8.9 - 10.3 mg/dL 9.0  Total Protein 6.5 - 8.1 g/dL 6.7  Total Bilirubin 0.3 - 1.2 mg/dL 0.5  Alkaline  Phos 38 - 126 U/L 78  AST 15 - 41 U/L 20  ALT 0 - 44 U/L 14   CBC Latest Ref Rng & Units 04/01/2021  WBC 4.0 - 10.5 K/uL 6.8  Hemoglobin 12.0 - 15.0 g/dL 8.5(L)  Hematocrit 36.0 - 46.0 % 27.7(L)  Platelets 150 - 400 K/uL 471(H)    No images are attached to the encounter.  DG Chest 2 View  Result Date: 03/09/2021 CLINICAL DATA:  Worsening epigastric and chest pain. Nausea and heaving. History of hiatal hernia. EXAM: CHEST - 2 VIEW COMPARISON:  None. FINDINGS: Eventration of right hemidiaphragm. Heart size difficult to assess, but likely upper normal. Aortic atherosclerosis and tortuosity. Mild atelectasis or scarring at the right lung base adjacent to elevated hemidiaphragm. No obvious hiatal hernia on the current exam. No acute airspace disease. No pleural effusion. Bones are diffusely under mineralized. Scoliotic curvature. Limited assessment of the mid lower thoracic spine. No obvious acute osseous abnormality. There is gaseous distention of colon under the right hemidiaphragm IMPRESSION: 1. Mild eventration of right hemidiaphragm with adjacent basilar atelectasis or scarring. No evidence of acute airspace disease. 2. Aortic atherosclerosis and tortuosity. 3. Gaseous distention of colon under the right hemidiaphragm, nonspecific. Electronically Signed   By: Keith Rake M.D.   On: 03/09/2021 23:51   CT ABDOMEN PELVIS W CONTRAST  Result Date: 03/15/2021 CLINICAL DATA:  Rectal bleeding that started on Friday. Recent volvulus surgery EXAM: CT ABDOMEN AND PELVIS WITH CONTRAST TECHNIQUE: Multidetector CT imaging of the abdomen and pelvis was performed using  the standard protocol following bolus administration of intravenous contrast. CONTRAST:  48mL OMNIPAQUE IOHEXOL 300 MG/ML  SOLN COMPARISON:  Five days ago FINDINGS: Lower chest: Trace pleural effusions and bilateral lower lobe atelectasis. Right middle lobe is collapsed with a chronic appearance in the setting of marked scoliosis. Mitral and aortic annular calcification. Aortic valve calcification. Hepatobiliary: No focal liver abnormality.No evidence of biliary obstruction or stone. Pancreas: Unremarkable. Spleen: Unremarkable. Adrenals/Urinary Tract: Negative adrenals. No hydronephrosis or stone. Unremarkable bladder. Stomach/Bowel: No obstruction or recurrent volvulus appearance. Masslike thickening the ascending colon with indistinguishable medial mass and adjacent ileocolic vessels, possibly extra serosal extension. The mass measures at least 6 cm in length. Vascular/Lymphatic: Less than typical atheromatous changes for age. Accentuated ileocolic vessels around the described mass. Ileocolic lymph nodes measure up to 13 x 8 mm. Reproductive:Unremarkable for age Other: Small volume pneumoperitoneum, expected after recent surgery. Abdominal wall gas which is also expected. Fluid density collection in the left groin related to hernia, possibly thin oral. No internal bowel is seen traversing the neck. Musculoskeletal: Advanced spinal degeneration and levoscoliosis. IMPRESSION: 1. Bulky ascending colon mass compatible with carcinoma. Possible ileocolic adenopathy and extra serosal extension. 2. No recurrent volvulus. 3. Left groin hernia, potentially femoral. 4. Numerous chronic findings are described above. Electronically Signed   By: Monte Fantasia M.D.   On: 03/15/2021 06:57   CT ABDOMEN PELVIS W CONTRAST  Result Date: 03/10/2021 CLINICAL DATA:  Abdominal pain. EXAM: CT ABDOMEN AND PELVIS WITH CONTRAST TECHNIQUE: Multidetector CT imaging of the abdomen and pelvis was performed using the standard protocol  following bolus administration of intravenous contrast. CONTRAST:  12mL OMNIPAQUE IOHEXOL 300 MG/ML  SOLN COMPARISON:  None. FINDINGS: Lower chest: Mild atelectasis is seen within the posterior aspect of the left lung base. Hepatobiliary: No focal liver abnormality is seen. No gallstones, gallbladder wall thickening, or biliary dilatation. Pancreas: Unremarkable. No pancreatic ductal dilatation or surrounding inflammatory changes. Spleen: Normal in size  without focal abnormality. Adrenals/Urinary Tract: Adrenal glands are unremarkable. Kidneys are normal in size, without renal calculi or hydronephrosis. A 0.9 cm cystic appearing areas seen within the upper pole of the right kidney. The urinary bladder is moderately distended. Stomach/Bowel: There is a large hiatal hernia with a moderate amount of adjacent fluid noted. The body of the stomach is elongated and extends inferiorly into the mid to lower right abdomen. This may be congenital in nature. Appendix appears normal. No evidence of bowel wall dilatation. Diverticula are seen throughout the large bowel. Twisting of the mesentery is seen within the midline of the upper abdomen on coronal images 21 through 35, CT series number 5). Vascular/Lymphatic: Mild aortic atherosclerosis with marked severity tortuosity of the abdominal aorta. Twisting of the mesentery and associated mesenteric vasculature is seen within the midline of the upper abdomen (best seen on coronal reformatted images 21-35, CT series number 5). Partial occlusion of the mesenteric veins is seen within this area. No enlarged abdominal or pelvic lymph nodes. Reproductive: Uterus and bilateral adnexa are unremarkable. Other: No abdominal wall hernia or abnormality. A mild amount of fluid is seen within the abdomen, with a mild-to-moderate amount of posterior pelvic free fluid also seen. Musculoskeletal: There is marked severity levoscoliosis of the lumbar spine. IMPRESSION: 1. Twisting of the mesentery  and associated vasculature, as described above, consistent with a volvulus. 2.   Large hiatal hernia and surrounding fluid. 3.   Colonic diverticulosis. 4.   Marked severity levoscoliosis of the lumbar spine. 5.   Mild ascites. 6. Aortic atherosclerosis. Aortic Atherosclerosis (ICD10-I70.0). Electronically Signed   By: Virgina Norfolk M.D.   On: 03/10/2021 01:25    Assessment and plan- Patient is a 87 y.o. female with newly diagnosed adenocarcinoma of the ascending colon stage IIIb pT3 N1 cM0 s/p surgery here to discuss further management  Colon cancer: Discussed the results of final pathology with the patient in detail which showed 11 cm tumor that was poorly differentiated with negative margins.  45 regional lymph nodes were negative for malignancy but there was a tumor deposit seen in 1 location making this and 1C.  She therefore has stage IIIb disease.  We will also obtain a CT chest without contrast to complete her staging work-up.  Ideally in stage III disease, patient's benefit from adjuvant chemotherapy.  However patient is elderly and frail.  It would be difficult for her to tolerate chemotherapy and patient herself is not interested in any chemotherapy at this time.  Patient also understands that there is a risk of recurrence with no adjuvant chemotherapy and we will monitor this with repeat CT scan in 6 months.  Normocytic anemia: Possibly secondary to iron deficiency.I will check ferritin and iron studies B12 folate and TSH today.  If patient has evidence of iron deficiency she would benefit from IV iron.  I will tentatively see her back in 2 weeks for a video visit and to discuss results of lab work  Cancer Staging Malignant neoplasm of colon [facility]) Staging form: Colon and Rectum, AJCC 8th Edition - Pathologic stage from 04/02/2021: Stage IIIB (pT3, pN1c, cM0) - Signed by Sindy Guadeloupe, MD on 04/02/2021 Total positive nodes: 0 Histologic grading system: 4 grade system Histologic grade  (G): G3 Residual tumor (R): R0 - None     Thank you for this kind referral and the opportunity to participate in the care of this patient   Visit Diagnosis 1. Malignant neoplasm of colon, unspecified part of colon (  [place])   2. Normocytic anemia   3. History of anemia due to chronic kidney disease     Dr. Randa Evens, MD, MPH [facility] [id number] 04/02/2021 12:27 PM

## 2021-04-02 NOTE — Progress Notes (Signed)
04/02/2021  HPI: Kristina Rowe is a 87 y.o. female s/p right colectomy for stage IIIc colon cancer of the distal ascending colon.  She presents today because overnight she started having nausea, vomiting, and upper abdominal discomfort.  She went to a restaurant last night for dinner and had broiled fish and baked potato.  She feels the food was well cooked.  However, overnight started having nausea and vomiting episodes.  This morning, she's feeling better over the past hour and has not required any nausea medication this morning.  Reports her abdominal discomfort is also improving.  Vital signs: BP (!) 144/82   Pulse 90   Temp 98.3 F (36.8 C)   Ht 5\' 1"  (1.549 m)   Wt 93 lb 12.8 oz (42.5 kg)   SpO2 99%   BMI 17.72 kg/m    Physical Exam: Constitutional:  No acute distress Abdomen:  Soft, non-distended, non-tender.  Midline incision with steri strips.  Incision healing well.  There's only some mild redness consistent with moisture from the skin fold in the upper abdomen.  Gauze placed there to keep clean/dry.  Assessment/Plan: This is a 87 y.o. female s/p right colectomy for stage IIIc colon cancer.  --Discussed with the patient that this sounds more like a gastroenteritis type of picture.  It is reassuring that she's feeling better this morning.  Recommended that today she try to keep hydrated with Gatorade or Pedialyte and if she's feeling better later, can start other light/bland foods like BRAT diet.  However, if she's not feeling better by tomorrow or there is any worsening, she should come to the ER for further evaluation. --Discussed also protein intake.  She doesn't tolerate the Ensure shakes as well, and Premier shakes are better but still has a bit of a hard time.  Recommended that she try fruit smoothies instead and add protein powder to it.  As this would not be milk based but fruit based, it may be easier to tolerate. --Follow up in two weeks as previously scheduled.   [name], [facility]

## 2021-04-05 ENCOUNTER — Telehealth: Payer: Self-pay | Admitting: *Deleted

## 2021-04-05 NOTE — Telephone Encounter (Signed)
It can sometimes cause headache. Ok to take tylenol. Call if no better in 2 days

## 2021-04-05 NOTE — Telephone Encounter (Signed)
Patient called reporting that she had an iron infusion Friday and that she woke with a headache yesterday and today. She is asking if the iron is causing this and would like a return call

## 2021-04-05 NOTE — Telephone Encounter (Signed)
Call returned to patient and informed per doctor that iron can cause headaches. She then said that she noticed that her neck muscles feel tight and stated that she has been reading a lot recently and thinks that may be the cause of her headaches and she is not going to read today and see if that helps

## 2021-04-09 ENCOUNTER — Inpatient Hospital Stay: Payer: Medicare Other

## 2021-04-09 VITALS — BP 145/77 | HR 78 | Temp 97.2°F | Resp 16

## 2021-04-09 DIAGNOSIS — D508 Other iron deficiency anemias: Secondary | ICD-10-CM

## 2021-04-09 DIAGNOSIS — C182 Malignant neoplasm of ascending colon: Secondary | ICD-10-CM | POA: Diagnosis not present

## 2021-04-09 MED ORDER — SODIUM CHLORIDE 0.9 % IV SOLN
510.0000 mg | INTRAVENOUS | Status: DC
  Administered 2021-04-09: 510 mg via INTRAVENOUS
  Filled 2021-04-09: qty 510

## 2021-04-09 MED ORDER — SODIUM CHLORIDE 0.9 % IV SOLN
Freq: Once | INTRAVENOUS | Status: AC
Start: 2021-04-09 — End: 2021-04-09
  Filled 2021-04-09: qty 250

## 2021-04-12 ENCOUNTER — Encounter: Payer: Self-pay | Admitting: Oncology

## 2021-04-12 ENCOUNTER — Inpatient Hospital Stay (HOSPITAL_BASED_OUTPATIENT_CLINIC_OR_DEPARTMENT_OTHER): Payer: Medicare Other | Admitting: Oncology

## 2021-04-12 DIAGNOSIS — C182 Malignant neoplasm of ascending colon: Secondary | ICD-10-CM | POA: Diagnosis not present

## 2021-04-12 DIAGNOSIS — D509 Iron deficiency anemia, unspecified: Secondary | ICD-10-CM | POA: Diagnosis not present

## 2021-04-12 LAB — SURGICAL PATHOLOGY

## 2021-04-12 NOTE — Progress Notes (Signed)
I connected with Kristina Rowe on 04/12/21 at  2:00 PM EDT by video enabled telemedicine visit and verified that I am speaking with the correct person using two identifiers.   I discussed the limitations, risks, security and privacy concerns of performing an evaluation and management service by telemedicine and the availability of in-person appointments. I also discussed with the patient that there may be a patient responsible charge related to this service. The patient expressed understanding and agreed to proceed.  Other persons participating in the visit and their role in the encounter:  Patients daughter  Patient's location:  home Provider's location:  work  Risk analyst Complaint: Discuss results of blood work  History of present illness: Patient is a 87 year old female who initially presented to the ER with symptoms of significant abdominal pain.  CT abdomen and pelvis on 03/10/2021 showed volvulus along with a large hiatal hernia.  Patient initially had an exploratory laparotomy with lysis of adhesion by Dr. [place] Abbot.  She then presented back to the ER with similar complaints and a repeat CT abdomen at that time showed bulky ascending colon mass compatible with carcinoma and possible local regional adenopathy.  No recurrent volvulus.  Patient underwent open right colectomy on 03/18/2021.  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  All regional lymph nodes 32 of them were negative for tumor but there was a tumor deposit present at 1 site.  PT3PN1C.  Risks versus benefits of adjuvant chemotherapy was discussed and patient was not deemed to be a candidate for adjuvant chemotherapy and she did not wish to go through chemotherapy herself.  Anemia work-up was consistent with iron deficiency and patient received 2 doses of Feraheme  Interval history patient completed her second dose of Feraheme a few days ago.  Reports no significant  improvement in her fatigue so far.   Review of Systems  Constitutional: Positive for malaise/fatigue. Negative for chills, fever and weight loss.  HENT: Negative for congestion, ear discharge and nosebleeds.   Eyes: Negative for blurred vision.  Respiratory: Negative for cough, hemoptysis, sputum production, shortness of breath and wheezing.   Cardiovascular: Negative for chest pain, palpitations, orthopnea and claudication.  Gastrointestinal: Negative for abdominal pain, blood in stool, constipation, diarrhea, heartburn, melena, nausea and vomiting.  Genitourinary: Negative for dysuria, flank pain, frequency, hematuria and urgency.  Musculoskeletal: Negative for back pain, joint pain and myalgias.  Skin: Negative for rash.  Neurological: Negative for dizziness, tingling, focal weakness, seizures, weakness and headaches.  Endo/Heme/Allergies: Does not bruise/bleed easily.  Psychiatric/Behavioral: Negative for depression and suicidal ideas. The patient does not have insomnia.     Allergies  Allergen Reactions  . Metronidazole Other (See Comments)    Other reaction(s): Other Mouth sores Mouth sores Mouth sores   . Omeprazole Other (See Comments)    Other reaction(s): Arthralgias (intolerance) Other reaction(s): Other   . Bactrim [Sulfamethoxazole-Trimethoprim] Nausea Only  . Codeine Nausea And Vomiting and Nausea Only    Other reaction(s): Nausea Only But tolerates tramadol     Past Medical History:  Diagnosis Date  . Ductal carcinoma in situ (DCIS) of left breast   . Dysphagia   . GERD (gastroesophageal reflux disease)   . Hiatal hernia   . Hypertension   . Iron deficiency anemia   . Osteoporosis   . Pulmonary embolism ([place])   . Scoliosis   . UTI (urinary tract infection)     Past Surgical History:  Procedure Laterality Date  .  BREAST LUMPECTOMY Left 2009   Ductal Carcinoma insitu   . CATARACT EXTRACTION, BILATERAL    . COLONOSCOPY N/A 03/17/2021   Procedure:  COLONOSCOPY;  Surgeon: Lesly Rubenstein, MD;  Location: [facility] ENDOSCOPY;  Service: Endoscopy;  Laterality: N/A;  . COLOSTOMY REVISION Right 03/18/2021   Procedure: COLON RESECTION RIGHT;  Surgeon: Olean Ree, MD;  Location: [facility] ORS;  Service: General;  Laterality: Right;  . LAPAROSCOPIC PARAESOPHAGEAL HERNIA REPAIR  2009  . LAPAROTOMY N/A 03/10/2021   Procedure: EXPLORATORY LAPAROTOMY;  Surgeon: Olean Ree, MD;  Location: [facility] ORS;  Service: General;  Laterality: N/A;  . TUBAL LIGATION      Social History   Socioeconomic History  . Marital status: Married    Spouse name: Not on file  . Number of children: Not on file  . Years of education: Not on file  . Highest education level: Not on file  Occupational History  . Not on file  Tobacco Use  . Smoking status: Never Smoker  . Smokeless tobacco: Never Used  Substance and Sexual Activity  . Alcohol use: Never  . Drug use: Never  . Sexual activity: Not Currently  Other Topics Concern  . Not on file  Social History Narrative  . Not on file   Social Determinants of Health   Financial Resource Strain: Not on file  Food Insecurity: Not on file  Transportation Needs: Not on file  Physical Activity: Not on file  Stress: Not on file  Social Connections: Not on file  Intimate Partner Violence: Not on file    Family History  Problem Relation Age of Onset  . Breast cancer Neg Hx      Current Outpatient Medications:  .  acetaminophen (TYLENOL) 500 MG tablet, Take 500 mg by mouth at bedtime as needed for mild pain, fever or headache., Disp: , Rfl:  .  amLODipine (NORVASC) 2.5 MG tablet, Take 2.5 mg by mouth daily., Disp: , Rfl:  .  calcium carbonate (OS-CAL) 1250 (500 Ca) MG chewable tablet, Chew 1 tablet by mouth daily., Disp: , Rfl:  .  Cholecalciferol 50 MCG (2000 UT) CAPS, Take 1 capsule by mouth daily., Disp: , Rfl:  .  cyanocobalamin 1000 MCG tablet, Take 1,000 mcg by mouth daily., Disp: , Rfl:  .  diclofenac Sodium  (VOLTAREN) 1 % GEL, Apply topically 4 (four) times daily., Disp: , Rfl:  .  econazole nitrate 1 % cream, Apply 1 application topically daily., Disp: , Rfl:  .  estradiol (ESTRACE) 0.1 MG/GM vaginal cream, Estrogen Cream Instruction Discard applicator Apply pea sized amount to tip of finger to urethra before bed. Wash hands well after application. Use Monday, Wednesday and Friday, Disp: 42.5 g, Rfl: 3 .  loperamide (IMODIUM) 2 MG capsule, Take 2 mg by mouth as needed for diarrhea or loose stools., Disp: , Rfl:  .  mirtazapine (REMERON) 30 MG tablet, Take 30 mg by mouth at bedtime., Disp: , Rfl:  .  pantoprazole (PROTONIX) 40 MG tablet, Take 40 mg by mouth daily., Disp: , Rfl:  .  promethazine (PHENERGAN) 25 MG tablet, Take 25 mg by mouth every 6 (six) hours as needed for nausea or vomiting., Disp: , Rfl:  .  SUMAtriptan (IMITREX) 50 MG tablet, Take 50 mg by mouth as directed. Take 1 at onset of headaTake 1 at onset of headache. May repeat once in two hours. Do not exceed 2 per day or 4 per week., Disp: , Rfl:  .  warfarin (COUMADIN) 2.5  MG tablet, On Sundays and Thursdays resume the 2.5 mg dose.  All other days take 5 mg. (Patient taking differently: Take 2.5-5 mg by mouth daily at 12 noon. On Sundays, Tuesday, and Thursdays resume the 2.5 mg dose.  All other days take 5 mg.), Disp: 30 tablet, Rfl: 11 .  warfarin (COUMADIN) 5 MG tablet, Take by mouth., Disp: , Rfl:  .  folic acid (FOLVITE) 1 MG tablet, Take 1 mg by mouth daily. (Patient not taking: Reported on 04/12/2021), Disp: , Rfl:   CT ABDOMEN PELVIS W CONTRAST  Result Date: 03/15/2021 CLINICAL DATA:  Rectal bleeding that started on Friday. Recent volvulus surgery EXAM: CT ABDOMEN AND PELVIS WITH CONTRAST TECHNIQUE: Multidetector CT imaging of the abdomen and pelvis was performed using the standard protocol following bolus administration of intravenous contrast. CONTRAST:  78mL OMNIPAQUE IOHEXOL 300 MG/ML  SOLN COMPARISON:  Five days ago FINDINGS:  Lower chest: Trace pleural effusions and bilateral lower lobe atelectasis. Right middle lobe is collapsed with a chronic appearance in the setting of marked scoliosis. Mitral and aortic annular calcification. Aortic valve calcification. Hepatobiliary: No focal liver abnormality.No evidence of biliary obstruction or stone. Pancreas: Unremarkable. Spleen: Unremarkable. Adrenals/Urinary Tract: Negative adrenals. No hydronephrosis or stone. Unremarkable bladder. Stomach/Bowel: No obstruction or recurrent volvulus appearance. Masslike thickening the ascending colon with indistinguishable medial mass and adjacent ileocolic vessels, possibly extra serosal extension. The mass measures at least 6 cm in length. Vascular/Lymphatic: Less than typical atheromatous changes for age. Accentuated ileocolic vessels around the described mass. Ileocolic lymph nodes measure up to 13 x 8 mm. Reproductive:Unremarkable for age Other: Small volume pneumoperitoneum, expected after recent surgery. Abdominal wall gas which is also expected. Fluid density collection in the left groin related to hernia, possibly thin oral. No internal bowel is seen traversing the neck. Musculoskeletal: Advanced spinal degeneration and levoscoliosis. IMPRESSION: 1. Bulky ascending colon mass compatible with carcinoma. Possible ileocolic adenopathy and extra serosal extension. 2. No recurrent volvulus. 3. Left groin hernia, potentially femoral. 4. Numerous chronic findings are described above. Electronically Signed   By: Monte Fantasia M.D.   On: 03/15/2021 06:57    No images are attached to the encounter.   CMP Latest Ref Rng & Units 04/01/2021  Glucose 70 - 99 mg/dL 117(H)  BUN 8 - 23 mg/dL 13  Creatinine 0.44 - 1.00 mg/dL 0.75  Sodium 135 - 145 mmol/L 138  Potassium 3.5 - 5.1 mmol/L 4.0  Chloride 98 - 111 mmol/L 101  CO2 22 - 32 mmol/L 28  Calcium 8.9 - 10.3 mg/dL 9.0  Total Protein 6.5 - 8.1 g/dL 6.7  Total Bilirubin 0.3 - 1.2 mg/dL 0.5   Alkaline Phos 38 - 126 U/L 78  AST 15 - 41 U/L 20  ALT 0 - 44 U/L 14   CBC Latest Ref Rng & Units 04/01/2021  WBC 4.0 - 10.5 K/uL 6.8  Hemoglobin 12.0 - 15.0 g/dL 8.5(L)  Hematocrit 36.0 - 46.0 % 27.7(L)  Platelets 150 - 400 K/uL 471(H)     Observation/objective: Appears in no acute distress over video visit today.  Breathing is nonlabored  Assessment and plan: Patient is a 87 year old female with history of stage III colon cancer and iron deficiency anemia here for discussing results of blood work  Anemia: Discussed the results of blood work with the patient which showed low iron saturation of 4% and ferritin of 16.  She has received 2 doses of Feraheme ending 3 days ago.  Reports no significant improvement in  her fatigue.  I will repeat her CBC in 10 days and repeat CBC ferritin and iron studies in 5 weeks to see if she needs any further IV iron  Stage III colon cancer: Patient has decided not to offer adjuvant chemotherapy.  She has staging CT chest scheduled on 04/22/2021  Follow-up instructions: Labs in 6 weeks followed by in person or video visit  I discussed the assessment and treatment plan with the patient. The patient was provided an opportunity to ask questions and all were answered. The patient agreed with the plan and demonstrated an understanding of the instructions.   The patient was advised to call back or seek an in-person evaluation if the symptoms worsen or if the condition fails to improve as anticipated.    Visit Diagnosis: 1. Malignant neoplasm of ascending colon ([place])   2. Iron deficiency anemia, unspecified iron deficiency anemia type     Dr. Randa Evens, MD, MPH [facility] Tel- [phone] 04/12/2021 3:00 PM

## 2021-04-14 ENCOUNTER — Other Ambulatory Visit: Payer: Self-pay

## 2021-04-14 ENCOUNTER — Inpatient Hospital Stay (HOSPITAL_BASED_OUTPATIENT_CLINIC_OR_DEPARTMENT_OTHER): Payer: Medicare Other | Admitting: Hospice and Palliative Medicine

## 2021-04-14 DIAGNOSIS — C182 Malignant neoplasm of ascending colon: Secondary | ICD-10-CM

## 2021-04-14 NOTE — Progress Notes (Signed)
Multidisciplinary Oncology Council Documentation  ARMINE RIZZOLO was presented by our [facility] on 04/14/2021, which included representatives from:  . Palliative Care . Dietitian  . Physical/Occupational Therapist . Nurse Navigator . Genetics . Speech Therapist . Social work . Survivorship RN . Hotel manager . Research RN . Hampton currently presents with history of stage IIIb CRC  We reviewed previous medical and familial history, history of present illness, and recent lab results along with all available histopathologic and imaging studies. The [place] considered available treatment options and made the following recommendations/referrals:  Consider nutrition and palliative care  The MOC is a meeting of clinicians from various specialty areas who evaluate and discuss patients for whom a multidisciplinary approach is being considered. Final determinations in the plan of care are those of the provider(s).   Today's extended care, comprehensive team conference, Tiah was not present for the discussion and was not examined.

## 2021-04-15 ENCOUNTER — Other Ambulatory Visit: Payer: Self-pay

## 2021-04-15 ENCOUNTER — Ambulatory Visit (INDEPENDENT_AMBULATORY_CARE_PROVIDER_SITE_OTHER): Payer: Medicare Other | Admitting: Physician Assistant

## 2021-04-15 ENCOUNTER — Encounter: Payer: Self-pay | Admitting: Physician Assistant

## 2021-04-15 VITALS — BP 108/69 | HR 80 | Temp 98.0°F | Wt 85.4 lb

## 2021-04-15 DIAGNOSIS — C189 Malignant neoplasm of colon, unspecified: Secondary | ICD-10-CM

## 2021-04-15 DIAGNOSIS — Z09 Encounter for follow-up examination after completed treatment for conditions other than malignant neoplasm: Secondary | ICD-10-CM

## 2021-04-15 DIAGNOSIS — K562 Volvulus: Secondary | ICD-10-CM

## 2021-04-15 NOTE — Progress Notes (Signed)
[facility] POST-OP OFFICE VISIT  04/15/2021  HPI: Kristina Rowe is a 87 y.o. female ~ 1 month s/p exploratory laparotomy and right hemicolectomy for adenocarcinoma of the ascending colon and ~1.5 months s/p initial exploratory laparotomy for small bowel volvulus, both with Dr [place] Abbot  She continues to do remarkably well considering No complaints of abdominal pain, nausea, emesis, or bowel changes Her midline incision has healed completely She is ambulating without issues Plan for CT Chest on 05/26 through oncology (Dr Janese Banks); not a candidate for chemotherapy  Vital signs: BP 108/69   Pulse 80   Temp 98 F (36.7 C)   Wt 85 lb 6.4 oz (38.7 kg)   SpO2 99%   BMI 16.14 kg/m    Physical Exam: Constitutional: Well appearing female, NAD Abdomen: Soft, non-tender, non-distended, no rebound/guarding Skin: Laparotomy incision has completely healed, no erythema  Assessment/Plan: This is a 87 y.o. female  ~ 1 month s/p exploratory laparotomy and right hemicolectomy for adenocarcinoma of the ascending colon and ~1.5 months s/p initial exploratory laparotomy for small bowel volvulus   - Nothing further from a surgical perspective  - Follow up with oncology as scheduled   - RTC in about 3 months for follow up; with Dr [place] Abbot   -- Kristina Simon, PA-C [facility] 04/15/2021, 11:30 AM [phone] M-F: 7am - 4pm

## 2021-04-15 NOTE — Patient Instructions (Signed)
Follow up in 3 months

## 2021-04-16 ENCOUNTER — Other Ambulatory Visit: Payer: Self-pay

## 2021-04-16 DIAGNOSIS — D509 Iron deficiency anemia, unspecified: Secondary | ICD-10-CM

## 2021-04-22 ENCOUNTER — Other Ambulatory Visit: Payer: Self-pay

## 2021-04-22 ENCOUNTER — Ambulatory Visit
Admission: RE | Admit: 2021-04-22 | Discharge: 2021-04-22 | Disposition: A | Discharge status: Home / Self Care | Payer: Medicare Other | Source: Ambulatory Visit | Attending: Oncology | Admitting: Oncology

## 2021-04-22 ENCOUNTER — Inpatient Hospital Stay: Payer: Medicare Other

## 2021-04-22 DIAGNOSIS — C182 Malignant neoplasm of ascending colon: Secondary | ICD-10-CM | POA: Diagnosis present

## 2021-04-22 DIAGNOSIS — N189 Chronic kidney disease, unspecified: Secondary | ICD-10-CM | POA: Insufficient documentation

## 2021-04-22 DIAGNOSIS — D509 Iron deficiency anemia, unspecified: Secondary | ICD-10-CM

## 2021-04-22 DIAGNOSIS — D649 Anemia, unspecified: Secondary | ICD-10-CM | POA: Diagnosis present

## 2021-04-22 DIAGNOSIS — Z862 Personal history of diseases of the blood and blood-forming organs and certain disorders involving the immune mechanism: Secondary | ICD-10-CM | POA: Insufficient documentation

## 2021-04-22 LAB — CBC WITH DIFFERENTIAL/PLATELET
Abs Immature Granulocytes: 0.01 10*3/uL (ref 0.00–0.07)
Basophils Absolute: 0 10*3/uL (ref 0.0–0.1)
Basophils Relative: 1 %
Eosinophils Absolute: 0 10*3/uL (ref 0.0–0.5)
Eosinophils Relative: 0 %
HCT: 33.3 % — ABNORMAL LOW (ref 36.0–46.0)
Hemoglobin: 10.5 g/dL — ABNORMAL LOW (ref 12.0–15.0)
Immature Granulocytes: 0 %
Lymphocytes Relative: 20 %
Lymphs Abs: 0.9 10*3/uL (ref 0.7–4.0)
MCH: 29.9 pg (ref 26.0–34.0)
MCHC: 31.5 g/dL (ref 30.0–36.0)
MCV: 94.9 fL (ref 80.0–100.0)
Monocytes Absolute: 0.5 10*3/uL (ref 0.1–1.0)
Monocytes Relative: 11 %
Neutro Abs: 2.9 10*3/uL (ref 1.7–7.7)
Neutrophils Relative %: 68 %
Platelets: 338 10*3/uL (ref 150–400)
RBC: 3.51 MIL/uL — ABNORMAL LOW (ref 3.87–5.11)
RDW: 23.7 % — ABNORMAL HIGH (ref 11.5–15.5)
WBC: 4.3 10*3/uL (ref 4.0–10.5)
nRBC: 0 % (ref 0.0–0.2)

## 2021-04-23 ENCOUNTER — Encounter: Payer: Self-pay | Admitting: Emergency Medicine

## 2021-04-23 ENCOUNTER — Telehealth: Payer: Self-pay | Admitting: Internal Medicine

## 2021-04-23 ENCOUNTER — Other Ambulatory Visit: Payer: Self-pay

## 2021-04-23 ENCOUNTER — Emergency Department
Admission: EM | Admit: 2021-04-23 | Discharge: 2021-04-23 | Disposition: A | Discharge status: Home / Self Care | Payer: Medicare Other | Attending: Emergency Medicine | Admitting: Emergency Medicine

## 2021-04-23 ENCOUNTER — Emergency Department: Payer: Medicare Other

## 2021-04-23 DIAGNOSIS — R9389 Abnormal findings on diagnostic imaging of other specified body structures: Secondary | ICD-10-CM | POA: Diagnosis present

## 2021-04-23 DIAGNOSIS — N1831 Chronic kidney disease, stage 3a: Secondary | ICD-10-CM | POA: Diagnosis not present

## 2021-04-23 DIAGNOSIS — Z85038 Personal history of other malignant neoplasm of large intestine: Secondary | ICD-10-CM | POA: Diagnosis not present

## 2021-04-23 DIAGNOSIS — I129 Hypertensive chronic kidney disease with stage 1 through stage 4 chronic kidney disease, or unspecified chronic kidney disease: Secondary | ICD-10-CM | POA: Diagnosis not present

## 2021-04-23 DIAGNOSIS — Z7901 Long term (current) use of anticoagulants: Secondary | ICD-10-CM | POA: Insufficient documentation

## 2021-04-23 DIAGNOSIS — Z79899 Other long term (current) drug therapy: Secondary | ICD-10-CM | POA: Diagnosis not present

## 2021-04-23 LAB — URINALYSIS, COMPLETE (UACMP) WITH MICROSCOPIC
Bacteria, UA: NONE SEEN
Bilirubin Urine: NEGATIVE
Glucose, UA: NEGATIVE mg/dL
Ketones, ur: NEGATIVE mg/dL
Leukocytes,Ua: NEGATIVE
Nitrite: NEGATIVE
Protein, ur: NEGATIVE mg/dL
Specific Gravity, Urine: 1.019 (ref 1.005–1.030)
pH: 5 (ref 5.0–8.0)

## 2021-04-23 LAB — CBC WITH DIFFERENTIAL/PLATELET
Abs Immature Granulocytes: 0.02 10*3/uL (ref 0.00–0.07)
Basophils Absolute: 0.1 10*3/uL (ref 0.0–0.1)
Basophils Relative: 1 %
Eosinophils Absolute: 0 10*3/uL (ref 0.0–0.5)
Eosinophils Relative: 0 %
HCT: 32.1 % — ABNORMAL LOW (ref 36.0–46.0)
Hemoglobin: 10.4 g/dL — ABNORMAL LOW (ref 12.0–15.0)
Immature Granulocytes: 0 %
Lymphocytes Relative: 25 %
Lymphs Abs: 1.2 10*3/uL (ref 0.7–4.0)
MCH: 30.3 pg (ref 26.0–34.0)
MCHC: 32.4 g/dL (ref 30.0–36.0)
MCV: 93.6 fL (ref 80.0–100.0)
Monocytes Absolute: 0.6 10*3/uL (ref 0.1–1.0)
Monocytes Relative: 11 %
Neutro Abs: 3 10*3/uL (ref 1.7–7.7)
Neutrophils Relative %: 63 %
Platelets: 336 10*3/uL (ref 150–400)
RBC: 3.43 MIL/uL — ABNORMAL LOW (ref 3.87–5.11)
RDW: 23.6 % — ABNORMAL HIGH (ref 11.5–15.5)
Smear Review: NORMAL
WBC: 4.9 10*3/uL (ref 4.0–10.5)
nRBC: 0 % (ref 0.0–0.2)

## 2021-04-23 LAB — COMPREHENSIVE METABOLIC PANEL
ALT: 11 U/L (ref 0–44)
AST: 15 U/L (ref 15–41)
Albumin: 3.9 g/dL (ref 3.5–5.0)
Alkaline Phosphatase: 76 U/L (ref 38–126)
Anion gap: 7 (ref 5–15)
BUN: 19 mg/dL (ref 8–23)
CO2: 27 mmol/L (ref 22–32)
Calcium: 9.4 mg/dL (ref 8.9–10.3)
Chloride: 102 mmol/L (ref 98–111)
Creatinine, Ser: 0.8 mg/dL (ref 0.44–1.00)
GFR, Estimated: >60 mL/min (ref >60)
Glucose, Bld: 113 mg/dL — ABNORMAL HIGH (ref 70–99)
Potassium: 4.5 mmol/L (ref 3.5–5.1)
Sodium: 136 mmol/L (ref 135–145)
Total Bilirubin: 0.7 mg/dL (ref 0.3–1.2)
Total Protein: 6.8 g/dL (ref 6.5–8.1)

## 2021-04-23 LAB — LACTIC ACID, PLASMA: Lactic Acid, Venous: 0.8 mmol/L (ref 0.5–1.9)

## 2021-04-23 LAB — LIPASE, BLOOD: Lipase: 22 U/L (ref 11–51)

## 2021-04-23 NOTE — Telephone Encounter (Signed)
On 5/27-left voicemail for the patient to call us back to discuss the results of the CT chest scan-concern for obstruction; need for further imaging.  Spoke to daughter-Joyce-regarding results of the CT scan showing gastric distention.  Patient prior history of volvulus.  As per daughter patient-complain of abdominal distention/fullness.  Recommend emergent evaluation in the emergency room for CT scan of the abdomen pelvis with contrast.  [name] in agreement; will take the patient to the emergency room ASAP.  GB

## 2021-04-23 NOTE — Discharge Instructions (Signed)
As we discussed, even though there was concerned about some abnormal findings on CT scan, your CT abdomen was reassuring tonight.  Even more reassuring is the fact that you are having no symptoms to suggest a volvulus or bowel obstruction.  Please continue with your normal medications and follow-up with Dr. Janese Banks and/or your primary care doctor at the next available opportunity.  Return to the emergency department if you develop new or worsening symptoms that concern you.

## 2021-04-23 NOTE — ED Provider Notes (Signed)
[facility] Emergency Department Provider Note  ____________________________________________   Event Date/Time   First MD Initiated Contact with Patient 04/23/21 2259     (approximate)  I have reviewed the triage vital signs and the nursing notes.   HISTORY  Chief Complaint Medical Evaluation     HPI Kristina Rowe is a 87 y.o. female with medical history as listed below who presents for further evaluation after an incidental finding on a CT scan of her chest.  She sees Dr. Janese Banks with oncology and had a CT scan of the chest.  The findings were equivocal for bowel obstruction or volvulus, and she has a complicated GI history including a prior issue with a volvulus and multiple abdominal surgeries.  Dr. Rogue Bussing is on-call for medical oncology and contacted the patient and her family and asked them to come to the emergency department immediately for further evaluation.  The patient says she feels fine, "the best I have felt in a long time".  She denies fever, sore throat, chest pain, shortness of breath, nausea, vomiting, and abdominal pain.  In fact, she states that she was at a restaurant getting dinner when she was contacted and told she had to go immediately to the emergency department.  She has no dysuria.  No symptoms of any kind and so therefore she cannot quantify the severity of them, nor the presence or absence of acute in onset versus gradual.        Past Medical History:  Diagnosis Date  . Ductal carcinoma in situ (DCIS) of left breast   . Dysphagia   . GERD (gastroesophageal reflux disease)   . Hiatal hernia   . Hypertension   . Iron deficiency anemia   . Osteoporosis   . Pulmonary embolism ([place])   . Scoliosis   . UTI (urinary tract infection)     Patient Active Problem List   Diagnosis Date Noted  . Basal cell carcinoma of leg 04/01/2021  . Coronary atherosclerosis 04/01/2021  . History of pulmonary embolism 04/01/2021  . Pulmonary  nodule 04/01/2021  . Malignant neoplasm of colon ([place]) 03/29/2021  . Rectal bleeding 03/15/2021  . Hypertension   . GERD (gastroesophageal reflux disease)   . Colonic mass   . 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 Anemia, unspecified 03/06/2021  . Bronchiectasis without complication ([place]) 26/71/2458  . Iron deficiency anemia 03/27/2020  . Moderate aortic stenosis 03/27/2020  . Stage 3a chronic kidney disease ([place]) 07/04/2019  . Hypnic headache 05/28/2019  . Telogen effluvium 04/17/2019  . Xerosis cutis 04/17/2019  . Essential hypertension 01/14/2019  . Cervical radiculopathy 05/04/2018  . Osteoarthritis of left glenohumeral joint 05/04/2018  . Vitamin B12 deficiency 01/01/2018  . Vascular abnormality 09/11/2017  . Dyspepsia 03/28/2017  . Pulmonary embolism ([place]) 08/16/2016  . Dilated pore of [place] 03/23/2015  . Retinal drusen of both eyes 08/28/2014  . Status post right cataract extraction 07/30/2014  . Verruca 03/19/2014  . Chronic back pain 11/06/2013  . Postmenopausal osteoporosis 06/11/2013  . Anticoagulated on Coumadin 04/06/2013  . Long term (current) use of anticoagulants 10/18/2012  . Preglaucoma 05/10/2012  . Presbyopia 05/10/2012  . Senile nuclear sclerosis 05/10/2012  . Lobular carcinoma in situ of breast 04/13/2012  . Closed fracture of ankle 02/27/2012  . History of nonmelanoma skin cancer 09/05/2011  . Other benign neoplasm of skin of trunk 09/05/2011  . Osteoporosis 08/25/2011  . Transient alteration of awareness 09/03/2009  . Colon adenoma 07/30/2002  Past Surgical History:  Procedure Laterality Date  . BREAST LUMPECTOMY Left 2009   Ductal Carcinoma insitu   . CATARACT EXTRACTION, BILATERAL    . COLONOSCOPY N/A 03/17/2021   Procedure: COLONOSCOPY;  Surgeon: Lesly Rubenstein, MD;  Location: [facility] ENDOSCOPY;  Service: Endoscopy;  Laterality: N/A;  . COLOSTOMY REVISION Right 03/18/2021   Procedure: COLON RESECTION RIGHT;  Surgeon: Olean Ree,  MD;  Location: [facility] ORS;  Service: General;  Laterality: Right;  . LAPAROSCOPIC PARAESOPHAGEAL HERNIA REPAIR  2009  . LAPAROTOMY N/A 03/10/2021   Procedure: EXPLORATORY LAPAROTOMY;  Surgeon: Olean Ree, MD;  Location: [facility] ORS;  Service: General;  Laterality: N/A;  . TUBAL LIGATION      Prior to Admission medications   Medication Sig Start Date End Date Taking? Authorizing Provider  acetaminophen (TYLENOL) 500 MG tablet Take 500 mg by mouth at bedtime as needed for mild pain, fever or headache.    [provider]  amLODipine (NORVASC) 2.5 MG tablet Take 2.5 mg by mouth daily. 02/24/21   [provider]  calcium carbonate (OS-CAL) 1250 (500 Ca) MG chewable tablet Chew 1 tablet by mouth daily. 04/20/09   [provider]  Cholecalciferol 50 MCG (2000 UT) CAPS Take 1 capsule by mouth daily.    [provider]  cyanocobalamin 1000 MCG tablet Take 1,000 mcg by mouth daily.    [provider]  diclofenac Sodium (VOLTAREN) 1 % GEL Apply topically 4 (four) times daily.    [provider]  econazole nitrate 1 % cream Apply 1 application topically daily.    [provider]  estradiol (ESTRACE) 0.1 MG/GM vaginal cream Estrogen Cream Instruction Discard applicator Apply pea sized amount to tip of finger to urethra before bed. Wash hands well after application. Use Monday, Wednesday and Friday 03/04/21   Billey Co, MD  folic acid (FOLVITE) 1 MG tablet Take 1 mg by mouth daily. 01/17/19   [provider]  loperamide (IMODIUM) 2 MG capsule Take 2 mg by mouth as needed for diarrhea or loose stools.    [provider]  mirtazapine (REMERON) 30 MG tablet Take 30 mg by mouth at bedtime. 03/02/21   [provider]  pantoprazole (PROTONIX) 40 MG tablet Take 40 mg by mouth daily.    [provider]  promethazine (PHENERGAN) 25 MG tablet Take 25 mg by mouth every 6 (six) hours as needed for nausea or vomiting.     [provider]  SUMAtriptan (IMITREX) 50 MG tablet Take 50 mg by mouth as directed. Take 1 at onset of headaTake 1 at onset of headache. May repeat once in two hours. Do not exceed 2 per day or 4 per week. 03/15/19   [provider]  warfarin (COUMADIN) 2.5 MG tablet On Sundays and Thursdays resume the 2.5 mg dose.  All other days take 5 mg. Patient taking differently: Take 2.5-5 mg by mouth daily at 12 noon. On Sundays, Tuesday, and Thursdays resume the 2.5 mg dose.  All other days take 5 mg. 03/14/21   Ronny Bacon, MD  warfarin (COUMADIN) 5 MG tablet Take by mouth. 03/26/21   [provider]    Allergies Metronidazole, Omeprazole, Bactrim [sulfamethoxazole-trimethoprim], and Codeine  Family History  Problem Relation Age of Onset  . Breast cancer Neg Hx     Social History Social History   Tobacco Use  . Smoking status: Never Smoker  . Smokeless tobacco: Never Used  Substance Use Topics  . Alcohol use: Never  .  Drug use: Never    Review of Systems Constitutional: No fever/chills Eyes: No visual changes. ENT: No sore throat. Cardiovascular: Denies chest pain. Respiratory: Denies shortness of breath. Gastrointestinal: No abdominal pain.  No nausea, no vomiting.  No diarrhea.  No constipation. Genitourinary: Negative for dysuria. Musculoskeletal: Negative for neck pain.  Negative for back pain. Integumentary: Negative for rash. Neurological: Negative for headaches, focal weakness or numbness.   ____________________________________________   PHYSICAL EXAM:  VITAL SIGNS: ED Triage Vitals  Enc Vitals Group     BP 04/23/21 1954 (!) 111/94     Pulse Rate 04/23/21 1954 (!) 101     Resp 04/23/21 1954 18     Temp 04/23/21 1954 98 F (36.7 C)     Temp Source 04/23/21 1954 Oral     SpO2 --      Weight --      Height --      Head Circumference --      Peak Flow --      Pain Score 04/23/21 2002 0     Pain Loc --      Pain Edu? --      Excl.  in [place]? --     Constitutional: Alert and oriented.  Eyes: Conjunctivae are normal.  Head: Atraumatic. Nose: No congestion/rhinnorhea. Mouth/Throat: Patient is wearing a mask. Neck: No stridor.  No meningeal signs.   Cardiovascular: Normal rate, regular rhythm. Good peripheral circulation. Respiratory: Normal respiratory effort.  No retractions. Gastrointestinal: Soft and nontender. No distention.  Musculoskeletal: No lower extremity tenderness nor edema. No gross deformities of extremities. Neurologic:  Normal speech and language. No gross focal neurologic deficits are appreciated.  Skin:  Skin is warm, dry and intact. Psychiatric: Mood and affect are normal. Speech and behavior are normal.  ____________________________________________   LABS (all labs ordered are listed, but only abnormal results are displayed)  Labs Reviewed  CBC WITH DIFFERENTIAL/PLATELET - Abnormal; Notable for the following components:      Result Value   RBC 3.43 (*)    Hemoglobin 10.4 (*)    HCT 32.1 (*)    RDW 23.6 (*)    All other components within normal limits  COMPREHENSIVE METABOLIC PANEL - Abnormal; Notable for the following components:   Glucose, Bld 113 (*)    All other components within normal limits  URINALYSIS, COMPLETE (UACMP) WITH MICROSCOPIC - Abnormal; Notable for the following components:   Color, Urine YELLOW (*)    APPearance CLEAR (*)    Hgb urine dipstick MODERATE (*)    All other components within normal limits  URINE CULTURE  LIPASE, BLOOD  LACTIC ACID, PLASMA   ____________________________________________  EKG  No indication for emergent EKG ____________________________________________  RADIOLOGY Ursula Alert, personally viewed and evaluated these images (plain radiographs) as part of my medical decision making, as well as reviewing the written report by the radiologist.  ED MD interpretation: No acute abnormalities but with multiple chronic changes such as  predominant air-fluid levels in the stomach and duodenum and a chronic change in the left groin.  Official radiology report(s): CT ABDOMEN PELVIS WO CONTRAST  Result Date: 04/23/2021 CLINICAL DATA:  Abdominal distension and discomfort. Question of obstruction on chest CT yesterday. Surgical history states colostomy revision 03/18/2021. EXAM: CT ABDOMEN AND PELVIS WITHOUT CONTRAST TECHNIQUE: Multidetector CT imaging of the abdomen and pelvis was performed following the standard protocol without IV contrast. COMPARISON:  Chest CT yesterday.  Abdominopelvic CT 03/15/2021 FINDINGS: Lower chest: Hiatal hernia with ingested  food material within the included esophagus which is dilated. This is similar to yesterday. Small amount of ascites adjacent to the hiatal hernia also unchanged. Compressive atelectasis in the lung bases. No pleural fluid. Hepatobiliary: No focal liver abnormality on this noncontrast exam. Gallbladder physiologically distended, no calcified stone. No biliary dilatation. Pancreas: Not well assessed on the current exam in the absence of contrast and paucity of intra-abdominal fat. No evidence of pancreatic inflammation. Spleen: Normal in size without focal abnormality. Adrenals/Urinary Tract: No adrenal nodule. No hydronephrosis, perinephric edema, or renal calculi. Partially distended urinary bladder. Stomach/Bowel: Bowel assessment is limited in the absence of enteric contrast and paucity of intra-abdominal fat. Large hiatal hernia. The intra-abdominal stomach is prominently dilated with air-fluid level. No evidence of gastric volvulus or inflammation. Suspected redundant mildly dilated duodenum with air-fluid levels. The small bowel is otherwise decompressed. Enteric sutures in the colon, presumably right hemicolectomy. No colonic inflammation. Vascular/Lymphatic: Aortic atherosclerosis and tortuosity. No aortic aneurysm. Limited assessment for adenopathy on the current exam. Reproductive: Normal  uterine atrophy for age.  No adnexal mass. Other: Fluid density structure in the left groin hernia, not significantly changed, possibly loculated fluid within a femoral hernia. No other ascites or free fluid. No free air. Musculoskeletal: Marked scoliosis.  No acute osseous abnormalities. IMPRESSION: 1. Moderate hiatal hernia with ingested food material within the included esophagus which is dilated. 2. The intra-abdominal stomach is prominently dilated with air-fluid level. Suspected redundant mildly dilated duodenum with air-fluid levels. 3. No evidence of colonic obstruction. Colonic sutures in the lower abdomen from colonic mass resection. 4. Bowel assessment is limited in the absence of contrast and paucity of intra-abdominal fat. 5. Fluid density structure in the left groin hernia, not significantly changed, possibly loculated fluid within a femoral hernia. Aortic Atherosclerosis (ICD10-I70.0). Electronically Signed   By: Keith Rake M.D.   On: 04/23/2021 20:23    ____________________________________________   PROCEDURES   Procedure(s) performed (including Critical Care):  Procedures   ____________________________________________   INITIAL IMPRESSION / MDM / [place] / ED COURSE  As part of my medical decision making, I reviewed the following data within the [place] History obtained from family, Nursing notes reviewed and incorporated, Labs reviewed , Old chart reviewed and Notes from prior ED visits   Differential diagnosis includes, but is not limited to, volvulus, SBO/ileus, variation of normal.  Patient has no clinical signs or symptoms which is reassuring.  Vital signs are stable; she was slightly tachycardic upon arrival but that settled down when she was resting.  She tells me she feels better than she has in a long time.  No acute abnormalities identified on metabolic panel or CBC.  Urinalysis is suggestive of some blood in the urine but  there is no bacteria seen and she has nitrite negative.  I do not want to treat asymptomatic pyuria or hematuria in an 87 year old woman so I sent a urine culture but will not start empiric antibiotics.  There are multiple abnormal but chronic findings on her CT that likely account for the abnormality seen on the CT chest.  She has some redundancy in the duodenum that led to some air-fluid levels.  However the radiologist commented on her scan tonight that she has no sign of obstruction or volvulus.  Clinically the patient is eating, drinking, and has no abdominal pain and very much wants to go home.  I provided reassurance and encouraged her to follow-up with her oncologist and/or primary care  doctor and I gave my usual and customary return precautions.  The patient and her daughter understand and agree with the plan.           ____________________________________________  FINAL CLINICAL IMPRESSION(S) / ED DIAGNOSES  Final diagnoses:  Abnormal finding on CT scan     MEDICATIONS GIVEN DURING THIS VISIT:  Medications - No data to display   ED Discharge Orders    None      *Please note:  NATOSHA BOU was evaluated in Emergency Department on 04/24/2021 for the symptoms described in the history of present illness. She was evaluated in the context of the global COVID-19 pandemic, which necessitated consideration that the patient might be at risk for infection with the SARS-CoV-2 virus that causes COVID-19. Institutional protocols and algorithms that pertain to the evaluation of patients at risk for COVID-19 are in a state of rapid change based on information released by regulatory bodies including the CDC and federal and state organizations. These policies and algorithms were followed during the patient's care in the ED.  Some ED evaluations and interventions may be delayed as a result of limited staffing during and after the pandemic.*  Note:  This document was prepared using Dragon voice  recognition software and may include unintentional dictation errors.   Hinda Kehr, MD 04/24/21 [phone]

## 2021-04-23 NOTE — ED Triage Notes (Addendum)
Pt presents to ER after speaking with her PCP who sent pt to ER to have scan of the abdomen due to some findings that chest CT scan showed. Pt denies any pain or discomfort.

## 2021-04-26 LAB — URINE CULTURE
Culture: 10000 — AB
Special Requests: NORMAL

## 2021-04-29 ENCOUNTER — Telehealth: Payer: Self-pay | Admitting: *Deleted

## 2021-04-29 NOTE — Telephone Encounter (Signed)
Please forward this message to Dr.Yu/on call doctor.  GB

## 2021-04-29 NOTE — Telephone Encounter (Signed)
Called pt after taking to Lauren NP and all the info is inchart

## 2021-04-29 NOTE — Telephone Encounter (Addendum)
Patient called reporting that she woke this morning and is unable to keep anything down. She reports that her weight is down to 81.3 # today. She is asking what can be done. Please advise.  Called pt and let her know that Dr. Janese Banks is off and NP said that based on what is going on with her stomach that she should go to ER and she can call Piscoya the surgeon to see if he has any other decision. She states that she was able to take chicken noodle soup today at lunch and it stayed down. She took 1/4 of phenergan this am. She will wait it out a bit longer and if she gets sick again she will go to ER

## 2021-04-30 ENCOUNTER — Other Ambulatory Visit: Payer: Self-pay | Admitting: Surgery

## 2021-04-30 ENCOUNTER — Telehealth: Payer: Self-pay | Admitting: Surgery

## 2021-04-30 MED ORDER — ONDANSETRON 4 MG PO TBDP
4.0000 mg | ORAL_TABLET | Freq: Three times a day (TID) | ORAL | 0 refills | Status: DC | PRN
Start: 2021-04-30 — End: 2022-05-27

## 2021-04-30 NOTE — Telephone Encounter (Signed)
S/P Laparotomy with colon resection 03/10/2021-complains of nausea- currently taking 1/4 of tablet-also drinking protein drinks- she is having diarrhea this morning twice- denies abdominal pain-denies fever-  Per Dr.Piscoya -Zofran-also instructed to follow up with Gastroenterologist.  Patient verbalized understanding.

## 2021-04-30 NOTE — Telephone Encounter (Signed)
Incoming call from patient.  She is s/p laparotomy & colon resection sx date 03/10/21 with Dr. Hampton Abbot.  Patient calls this morning stating that she is constantly nauseated.  Is taking her Phenergan just to try and get some food in her.  There is no vomiting, fever or chills. Patient concerned of the nausea and not able to eat and drink like she should.  Also states that she is losing more weight.  Down to about 80 lbs.  Please call her, thank you.

## 2021-05-14 ENCOUNTER — Inpatient Hospital Stay: Payer: Medicare Other | Attending: Oncology

## 2021-05-14 ENCOUNTER — Other Ambulatory Visit: Payer: Self-pay

## 2021-05-14 DIAGNOSIS — D509 Iron deficiency anemia, unspecified: Secondary | ICD-10-CM | POA: Diagnosis present

## 2021-05-14 LAB — CBC WITH DIFFERENTIAL/PLATELET
Abs Immature Granulocytes: 0.07 10*3/uL (ref 0.00–0.07)
Basophils Absolute: 0 10*3/uL (ref 0.0–0.1)
Basophils Relative: 1 %
Eosinophils Absolute: 0 10*3/uL (ref 0.0–0.5)
Eosinophils Relative: 0 %
HCT: 32.7 % — ABNORMAL LOW (ref 36.0–46.0)
Hemoglobin: 10.4 g/dL — ABNORMAL LOW (ref 12.0–15.0)
Immature Granulocytes: 1 %
Lymphocytes Relative: 18 %
Lymphs Abs: 1.1 10*3/uL (ref 0.7–4.0)
MCH: 30.9 pg (ref 26.0–34.0)
MCHC: 31.8 g/dL (ref 30.0–36.0)
MCV: 97 fL (ref 80.0–100.0)
Monocytes Absolute: 0.5 10*3/uL (ref 0.1–1.0)
Monocytes Relative: 8 %
Neutro Abs: 4.4 10*3/uL (ref 1.7–7.7)
Neutrophils Relative %: 72 %
Platelets: 393 10*3/uL (ref 150–400)
RBC: 3.37 MIL/uL — ABNORMAL LOW (ref 3.87–5.11)
RDW: 21 % — ABNORMAL HIGH (ref 11.5–15.5)
WBC: 6.2 10*3/uL (ref 4.0–10.5)
nRBC: 0 % (ref 0.0–0.2)

## 2021-05-14 LAB — IRON AND TIBC
Iron: 49 ug/dL (ref 28–170)
Saturation Ratios: 24 % (ref 10.4–31.8)
TIBC: 204 ug/dL — ABNORMAL LOW (ref 250–450)
UIBC: 155 ug/dL

## 2021-05-14 LAB — FERRITIN: Ferritin: 134 ng/mL (ref 11–307)

## 2021-05-17 ENCOUNTER — Other Ambulatory Visit: Payer: Medicare Other

## 2021-05-18 ENCOUNTER — Encounter: Payer: Self-pay | Admitting: Oncology

## 2021-05-18 ENCOUNTER — Inpatient Hospital Stay: Payer: Medicare Other | Admitting: Oncology

## 2021-05-19 ENCOUNTER — Inpatient Hospital Stay (HOSPITAL_BASED_OUTPATIENT_CLINIC_OR_DEPARTMENT_OTHER): Payer: Medicare Other | Admitting: Oncology

## 2021-05-19 ENCOUNTER — Other Ambulatory Visit: Payer: Self-pay

## 2021-05-19 DIAGNOSIS — D509 Iron deficiency anemia, unspecified: Secondary | ICD-10-CM

## 2021-05-19 DIAGNOSIS — C182 Malignant neoplasm of ascending colon: Secondary | ICD-10-CM

## 2021-05-19 NOTE — Progress Notes (Signed)
I connected with Kristina Rowe on 05/19/21 at  8:30 AM EDT by video enabled telemedicine visit and verified that I am speaking with the correct person using two identifiers.   I discussed the limitations, risks, security and privacy concerns of performing an evaluation and management service by telemedicine and the availability of in-person appointments. I also discussed with the patient that there may be a patient responsible charge related to this service. The patient expressed understanding and agreed to proceed.  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  CT abdomen and pelvis on 03/10/2021 showed volvulus along with a large hiatal hernia.  Patient initially had an exploratory laparotomy with lysis of adhesion by Dr. Hampton Abbot.  She then presented back to the ER with similar complaints and a repeat CT abdomen at that time showed bulky ascending colon mass compatible with carcinoma and possible local regional adenopathy.  No recurrent volvulus.  Patient underwent open right colectomy on 03/18/2021.  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  All regional lymph nodes 32 of them were negative for tumor but there was a tumor deposit present at 1 site.  [id number].   Risks versus benefits of adjuvant chemotherapy was discussed and patient was not deemed to be a candidate for adjuvant chemotherapy and she did not wish to go through chemotherapy herself.  Anemia work-up was consistent with iron deficiency and patient received 2 doses of Feraheme   Interval history patient reports that her appetite is getting better.   Denies any significant abdominal pain.  Nausea is presently well controlled.  Weight is still around 85 pounds.   Review of Systems  Constitutional:  Positive for malaise/fatigue and weight loss. Negative for chills and fever.  HENT:  Negative for congestion, ear discharge and nosebleeds.   Eyes:  Negative for blurred vision.  Respiratory:  Negative for cough, hemoptysis, sputum production, shortness of breath and wheezing.   Cardiovascular:  Negative for chest pain, palpitations, orthopnea and claudication.  Gastrointestinal:  Negative for abdominal pain, blood in stool, constipation, diarrhea, heartburn, melena, nausea and vomiting.  Genitourinary:  Negative for dysuria, flank pain, frequency, hematuria and urgency.  Musculoskeletal:  Negative for back pain, joint pain and myalgias.  Skin:  Negative for rash.  Neurological:  Negative for dizziness, tingling, focal weakness, seizures, weakness and headaches.  Endo/Heme/Allergies:  Does not bruise/bleed easily.  Psychiatric/Behavioral:  Negative for depression and suicidal ideas. The patient does not have insomnia.    Allergies  Allergen Reactions   Metronidazole Other (See Comments)    Other reaction(s): Other Mouth sores Mouth sores Mouth sores    Omeprazole Other (See Comments)    Other reaction(s): Arthralgias (intolerance) Other reaction(s): Other    Bactrim [Sulfamethoxazole-Trimethoprim] Nausea Only   Codeine Nausea And Vomiting and Nausea Only    Other reaction(s): Nausea Only But tolerates tramadol     Past Medical History:  Diagnosis Date   Ductal carcinoma in situ (DCIS) of left breast    Dysphagia    GERD (gastroesophageal reflux disease)    Hiatal hernia    Hypertension    Iron deficiency anemia    Osteoporosis  Pulmonary embolism ([place])    Scoliosis    UTI (urinary tract infection)     Past Surgical History:  Procedure Laterality Date   BREAST LUMPECTOMY Left 2009   Ductal Carcinoma insitu     CATARACT EXTRACTION, BILATERAL     COLONOSCOPY N/A 03/17/2021   Procedure: COLONOSCOPY;  Surgeon: Lesly Rubenstein, MD;  Location: [facility];  Service: Endoscopy;  Laterality: N/A;   COLOSTOMY REVISION Right 03/18/2021   Procedure: COLON RESECTION RIGHT;  Surgeon: Olean Ree, MD;  Location: [facility] ORS;  Service: General;  Laterality: Right;   LAPAROSCOPIC PARAESOPHAGEAL HERNIA REPAIR  2009   LAPAROTOMY N/A 03/10/2021   Procedure: EXPLORATORY LAPAROTOMY;  Surgeon: Olean Ree, MD;  Location: [facility] ORS;  Service: General;  Laterality: N/A;   TUBAL LIGATION      Social History   Socioeconomic History   Marital status: Married    Spouse name: Not on file   Number of children: Not on file   Years of education: Not on file   Highest education level: Not on file  Occupational History   Not on file  Tobacco Use   Smoking status: Never   Smokeless tobacco: Never  Substance and Sexual Activity   Alcohol use: Never   Drug use: Never   Sexual activity: Not Currently  Other Topics Concern   Not on file  Social History Narrative   Not on file   Social Determinants of Health   Financial Resource Strain: Not on file  Food Insecurity: Not on file  Transportation Needs: Not on file  Physical Activity: Not on file  Stress: Not on file  Social Connections: Not on file  Intimate Partner Violence: Not on file    Family History  Problem Relation Age of Onset   Breast cancer Neg Hx      Current Outpatient Medications:    acetaminophen (TYLENOL) 500 MG tablet, Take 500 mg by mouth at bedtime as needed for mild pain, fever or headache., Disp: , Rfl:    Cholecalciferol 50 MCG (2000 UT) CAPS, Take 1 capsule by mouth daily., Disp: , Rfl:    cyanocobalamin 1000 MCG tablet, Take 1,000 mcg by mouth daily., Disp: , Rfl:    diclofenac Sodium (VOLTAREN) 1 % GEL, Apply topically 4 (four) times daily., Disp: , Rfl:    econazole nitrate 1 % cream, Apply 1 application topically daily., Disp: ,  Rfl:    estradiol (ESTRACE) 0.1 MG/GM vaginal cream, Estrogen Cream Instruction Discard applicator Apply pea sized amount to tip of finger to urethra before bed. Wash hands well after application. Use Monday, Wednesday and Friday, Disp: 23.5 g, Rfl: 3   folic acid (FOLVITE) 1 MG tablet, Take 1 mg by mouth daily., Disp: , Rfl:    loperamide (IMODIUM) 2 MG capsule, Take 2 mg by mouth as needed for diarrhea or loose stools., Disp: , Rfl:    mirtazapine (REMERON) 30 MG tablet, Take 30 mg by mouth at bedtime., Disp: , Rfl:    ondansetron (ZOFRAN ODT) 4 MG disintegrating tablet, Take 1 tablet (4 mg total) by mouth every 8 (eight) hours as needed for nausea or vomiting. (Patient not taking: Reported on 05/18/2021), Disp: 20 tablet, Rfl: 0   pantoprazole (PROTONIX) 40 MG tablet, Take 40 mg by mouth daily., Disp: , Rfl:    promethazine (PHENERGAN) 25 MG tablet, Take 25 mg by mouth every 6 (six) hours as needed for nausea or vomiting., Disp: , Rfl:    SUMAtriptan (IMITREX) 50 MG tablet,  Take 50 mg by mouth as directed. Take 1 at onset of headaTake 1 at onset of headache. May repeat once in two hours. Do not exceed 2 per day or 4 per week., Disp: , Rfl:    warfarin (COUMADIN) 2.5 MG tablet, On Sundays and Thursdays resume the 2.5 mg dose.  All other days take 5 mg. (Patient taking differently: Take 2.5-5 mg by mouth daily at 12 noon. On Sundays, Tuesday, and Thursdays resume the 2.5 mg dose.  All other days take 5 mg.), Disp: 30 tablet, Rfl: 11   warfarin (COUMADIN) 5 MG tablet, Take by mouth., Disp: , Rfl:    calcium carbonate (OS-CAL) 1250 (500 Ca) MG chewable tablet, Chew 1 tablet by mouth daily. (Patient not taking: Reported on 05/18/2021), Disp: , Rfl:   CT ABDOMEN PELVIS WO CONTRAST  Result Date: 04/23/2021 CLINICAL DATA:  Abdominal distension and discomfort. Question of obstruction on chest CT yesterday. Surgical history states colostomy revision 03/18/2021. EXAM: CT ABDOMEN AND PELVIS WITHOUT CONTRAST  TECHNIQUE: Multidetector CT imaging of the abdomen and pelvis was performed following the standard protocol without IV contrast. COMPARISON:  Chest CT yesterday.  Abdominopelvic CT 03/15/2021 FINDINGS: Lower chest: Hiatal hernia with ingested food material within the included esophagus which is dilated. This is similar to yesterday. Small amount of ascites adjacent to the hiatal hernia also unchanged. Compressive atelectasis in the lung bases. No pleural fluid. Hepatobiliary: No focal liver abnormality on this noncontrast exam. Gallbladder physiologically distended, no calcified stone. No biliary dilatation. Pancreas: Not well assessed on the current exam in the absence of contrast and paucity of intra-abdominal fat. No evidence of pancreatic inflammation. Spleen: Normal in size without focal abnormality. Adrenals/Urinary Tract: No adrenal nodule. No hydronephrosis, perinephric edema, or renal calculi. Partially distended urinary bladder. Stomach/Bowel: Bowel assessment is limited in the absence of enteric contrast and paucity of intra-abdominal fat. Large hiatal hernia. The intra-abdominal stomach is prominently dilated with air-fluid level. No evidence of gastric volvulus or inflammation. Suspected redundant mildly dilated duodenum with air-fluid levels. The small bowel is otherwise decompressed. Enteric sutures in the colon, presumably right hemicolectomy. No colonic inflammation. Vascular/Lymphatic: Aortic atherosclerosis and tortuosity. No aortic aneurysm. Limited assessment for adenopathy on the current exam. Reproductive: Normal uterine atrophy for age.  No adnexal mass. Other: Fluid density structure in the left groin hernia, not significantly changed, possibly loculated fluid within a femoral hernia. No other ascites or free fluid. No free air. Musculoskeletal: Marked scoliosis.  No acute osseous abnormalities. IMPRESSION: 1. Moderate hiatal hernia with ingested food material within the included esophagus  which is dilated. 2. The intra-abdominal stomach is prominently dilated with air-fluid level. Suspected redundant mildly dilated duodenum with air-fluid levels. 3. No evidence of colonic obstruction. Colonic sutures in the lower abdomen from colonic mass resection. 4. Bowel assessment is limited in the absence of contrast and paucity of intra-abdominal fat. 5. Fluid density structure in the left groin hernia, not significantly changed, possibly loculated fluid within a femoral hernia. Aortic Atherosclerosis (ICD10-I70.0). Electronically Signed   By: Keith Rake M.D.   On: 04/23/2021 20:23   CT Chest Wo Contrast  Result Date: 04/23/2021 CLINICAL DATA:  New diagnosis of colon cancer.  Staging. EXAM: CT CHEST WITHOUT CONTRAST TECHNIQUE: Multidetector CT imaging of the chest was performed following the standard protocol without IV contrast. COMPARISON:  None. FINDINGS: Cardiovascular: The heart size is normal. No substantial pericardial effusion. Atherosclerotic calcification is noted in the wall of the thoracic aorta. Mediastinum/Nodes: No mediastinal lymphadenopathy.  No evidence for gross hilar lymphadenopathy although assessment is limited by the lack of intravenous contrast on today's study. Large hiatal hernia with features suggesting wall thickening in the distal esophagus. Lungs/Pleura: Scattered areas of bronchiectasis noted in the lungs bilaterally. There is some patchy ground-glass nodularity in the posterior right upper lobe in a region of small peripheral airway impaction. Peripheral airway impaction noted in the lingula and posterior left lower lobe. No suspicious pulmonary nodule or mass. Insert no pleural effusion. Upper Abdomen: Stomach appears massively distended although this is incompletely visualized. There may be some associated colonic dilatation. Musculoskeletal: Thoracolumbar scoliosis evident. No worrisome lytic or sclerotic osseous abnormality. IMPRESSION: 1. No definite evidence for  metastatic disease in the chest. 2. Scattered areas of bronchiectasis in the lungs bilaterally with some patchy ground-glass nodularity in the posterior right upper lobe in a region of small peripheral airway impaction. Peripheral airway impaction noted in the lingula and posterior left lower lobe. Imaging features compatible with sequelae of atypical infection. 3. Large hiatal hernia with features suggesting wall thickening in the distal esophagus. CT chest with contrast, barium swallow, or upper endoscopy could be used to further evaluate. 4. Partial visualization of the upper abdomen shows dramatic distention of the fluid-filled stomach with some apparent fluid-filled dilatation of the abdominal colon although this is not well evaluated given the noncontrast CT and incomplete visualization. Obstruction is a concern. Abdominopelvic CT with oral and intravenous contrast recommended to further evaluate. 5. Aortic Atherosclerosis (ICD10-I70.0). These results will be called to the ordering clinician or representative by the Radiologist Assistant, and communication documented in the PACS or [employer]. Electronically Signed   By: Misty Stanley M.D.   On: 04/23/2021 16:05    No images are attached to the encounter.   CMP Latest Ref Rng & Units 04/23/2021  Glucose 70 - 99 mg/dL 113(H)  BUN 8 - 23 mg/dL 19  Creatinine 0.44 - 1.00 mg/dL 0.80  Sodium 135 - 145 mmol/L 136  Potassium 3.5 - 5.1 mmol/L 4.5  Chloride 98 - 111 mmol/L 102  CO2 22 - 32 mmol/L 27  Calcium 8.9 - 10.3 mg/dL 9.4  Total Protein 6.5 - 8.1 g/dL 6.8  Total Bilirubin 0.3 - 1.2 mg/dL 0.7  Alkaline Phos 38 - 126 U/L 76  AST 15 - 41 U/L 15  ALT 0 - 44 U/L 11   CBC Latest Ref Rng & Units 05/14/2021  WBC 4.0 - 10.5 K/uL 6.2  Hemoglobin 12.0 - 15.0 g/dL 10.4(L)  Hematocrit 36.0 - 46.0 % 32.7(L)  Platelets 150 - 400 K/uL 393     Observation/objective: Appears in no acute distress over video visit today.  Breathing is  nonlabored  Assessment and plan: Patient is a 87 year old female with history of iron deficiency anemia and stage III colon cancer and this is a routine follow-up visit  Stage III colon cancer: Repeat CT chest abdomen and pelvis without contrast 6 months from now and I will see her thereafter  Iron deficiency anemia: Hemoglobin improved from 8.5-10.4 after IV iron.  Present iron studies are indicative of anemia of chronic disease.  She does not require any IV iron at this time.  Repeat CBC ferritin and iron studies in 3 in 6 months  Patient also had a CT chest in May 2022 which showed findings of Significant distention of the stomach and some colonic dilation.  Large hiatal hernia.  There was concern for obstruction on the CT and patient was sent to the ER  and repeat CT abdomen did not show any evidence of colonic obstruction and patient was discharged.  Patient does follow-up with Dr. Truddie Crumble at [facility] and will be getting an upper endoscopy soon as well.  She has a history of esophageal strictures which get periodically dilated every 6 months  Follow-up instructions: As above  I discussed the assessment and treatment plan with the patient. The patient was provided an opportunity to ask questions and all were answered. The patient agreed with the plan and demonstrated an understanding of the instructions.   The patient was advised to call back or seek an in-person evaluation if the symptoms worsen or if the condition fails to improve as anticipated.  Visit Diagnosis: 1. Iron deficiency anemia, unspecified iron deficiency anemia type   2. Malignant neoplasm of ascending colon (HCC)     Dr. Randa Evens, MD, MPH [facility] Tel- [phone] 05/19/2021 9:23 AM

## 2021-05-21 ENCOUNTER — Telehealth: Payer: Self-pay | Admitting: Oncology

## 2021-05-21 NOTE — Telephone Encounter (Signed)
Called patient and explained future apts and plan of care. Patient verbalizes understanding. I will mail patients appointments. No other questions at this time.

## 2021-05-21 NOTE — Telephone Encounter (Signed)
We can have her come in for np visit in 3 months

## 2021-05-21 NOTE — Telephone Encounter (Signed)
Patient called concerned that she isn't seeing Dr. Janese Banks for 6 months with a "new cancer" diagnosis. She has requested a call back from nursing staff for "reassurance".

## 2021-05-25 ENCOUNTER — Encounter: Payer: Self-pay | Admitting: Physician Assistant

## 2021-05-25 ENCOUNTER — Ambulatory Visit (INDEPENDENT_AMBULATORY_CARE_PROVIDER_SITE_OTHER): Payer: Medicare Other | Admitting: Physician Assistant

## 2021-05-25 ENCOUNTER — Telehealth: Payer: Self-pay

## 2021-05-25 ENCOUNTER — Other Ambulatory Visit: Payer: Self-pay

## 2021-05-25 VITALS — BP 139/84 | HR 105 | Ht 61.0 in | Wt 90.0 lb

## 2021-05-25 DIAGNOSIS — N3281 Overactive bladder: Secondary | ICD-10-CM | POA: Diagnosis not present

## 2021-05-25 LAB — MICROSCOPIC EXAMINATION: Bacteria, UA: NONE SEEN

## 2021-05-25 LAB — URINALYSIS, COMPLETE
Bilirubin, UA: NEGATIVE
Glucose, UA: NEGATIVE
Ketones, UA: NEGATIVE
Nitrite, UA: NEGATIVE
Protein,UA: NEGATIVE
RBC, UA: NEGATIVE
Specific Gravity, UA: 1.02 (ref 1.005–1.030)
Urobilinogen, Ur: 0.2 mg/dL (ref 0.2–1.0)
pH, UA: 6.5 (ref 5.0–7.5)

## 2021-05-25 LAB — BLADDER SCAN AMB NON-IMAGING: Scan Result: 6 mL

## 2021-05-25 MED ORDER — TOLTERODINE TARTRATE ER 4 MG PO CP24: 4.0000 mg | ORAL_CAPSULE | Freq: Every day | ORAL | 11 refills | Status: DC

## 2021-05-25 NOTE — Progress Notes (Signed)
05/25/2021 1:43 PM   Kristina Rowe November 17, 1930 [id number]  CC: Chief Complaint  Patient presents with   Urinary Frequency    HPI: Kristina Rowe is a 86 y.o. female with PMH OAB wet previously on tolterodine who discontinued Myrbetriq due to cost and recurrent UTI on topical vaginal estrogen cream who presents today for evaluation of possible UTI.   Today she reports a 2-day history of worsened frequency and urgency with nocturia every 2-1/2 hours.  She reports lower abdominal pressure without dysuria or pain.  Additionally, she reports having undergone ex lap and lysis of adhesion with Dr. Hampton Abbot on 03/10/2021 for management of mesenteric volvulus followed by right colectomy with Dr. Hampton Abbot on 03/18/2021 for management of a right colon adenocarcinoma.  Surgical pathology indicated negative margins and no nodal involvement.  She will not require chemotherapy.  On chart review, tolterodine was stopped the day prior to her ex lap when she called our office reporting pelvic pressure, discomfort, and difficulty voiding.  In-office UA today positive for trace leukocyte esterase; urine microscopy pan negative. PVR 58mL.  PMH: Past Medical History:  Diagnosis Date   Ductal carcinoma in situ (DCIS) of left breast    Dysphagia    GERD (gastroesophageal reflux disease)    Hiatal hernia    Hypertension    Iron deficiency anemia    Osteoporosis    Pulmonary embolism (HCC)    Scoliosis    UTI (urinary tract infection)     Surgical History: Past Surgical History:  Procedure Laterality Date   BREAST LUMPECTOMY Left 2009   Ductal Carcinoma insitu    CATARACT EXTRACTION, BILATERAL     COLONOSCOPY N/A 03/17/2021   Procedure: COLONOSCOPY;  Surgeon: Lesly Rubenstein, MD;  Location: [facility] ENDOSCOPY;  Service: Endoscopy;  Laterality: N/A;   COLOSTOMY REVISION Right 03/18/2021   Procedure: COLON RESECTION RIGHT;  Surgeon: Olean Ree, MD;  Location: [facility] ORS;  Service: General;   Laterality: Right;   LAPAROSCOPIC PARAESOPHAGEAL HERNIA REPAIR  2009   LAPAROTOMY N/A 03/10/2021   Procedure: EXPLORATORY LAPAROTOMY;  Surgeon: Olean Ree, MD;  Location: [facility] ORS;  Service: General;  Laterality: N/A;   TUBAL LIGATION      Home Medications:  Allergies as of 05/25/2021       Reactions   Metronidazole Other (See Comments)   Other reaction(s): Other Mouth sores Mouth sores Mouth sores   Omeprazole Other (See Comments)   Other reaction(s): Arthralgias (intolerance) Other reaction(s): Other   Bactrim [sulfamethoxazole-trimethoprim] Nausea Only   Codeine Nausea And Vomiting, Nausea Only   Other reaction(s): Nausea Only But tolerates tramadol        Medication List        Accurate as of May 25, 2021  1:43 PM. If you have any questions, ask your nurse or doctor.          acetaminophen 500 MG tablet Commonly known as: TYLENOL Take 500 mg by mouth at bedtime as needed for mild pain, fever or headache.   calcium carbonate 1250 (500 Ca) MG chewable tablet Commonly known as: OS-CAL Chew 1 tablet by mouth daily.   Cholecalciferol 50 MCG (2000 UT) Caps Take 1 capsule by mouth daily.   cyanocobalamin 1000 MCG tablet Take 1,000 mcg by mouth daily.   diclofenac Sodium 1 % Gel Commonly known as: VOLTAREN Apply topically 4 (four) times daily.   econazole nitrate 1 % cream Apply 1 application topically daily.   estradiol 0.1 MG/GM vaginal cream Commonly known as:  ESTRACE Estrogen Cream Instruction Discard applicator Apply pea sized amount to tip of finger to urethra before bed. Wash hands well after application. Use Monday, Wednesday and Friday   folic acid 1 MG tablet Commonly known as: FOLVITE Take 1 mg by mouth daily.   loperamide 2 MG capsule Commonly known as: IMODIUM Take 2 mg by mouth as needed for diarrhea or loose stools.   mirtazapine 30 MG tablet Commonly known as: REMERON Take 30 mg by mouth at bedtime.   ondansetron 4 MG  disintegrating tablet Commonly known as: Zofran ODT Take 1 tablet (4 mg total) by mouth every 8 (eight) hours as needed for nausea or vomiting.   pantoprazole 40 MG tablet Commonly known as: PROTONIX Take 40 mg by mouth daily.   promethazine 25 MG tablet Commonly known as: PHENERGAN Take 25 mg by mouth every 6 (six) hours as needed for nausea or vomiting.   SUMAtriptan 50 MG tablet Commonly known as: IMITREX Take 50 mg by mouth as directed. Take 1 at onset of headaTake 1 at onset of headache. May repeat once in two hours. Do not exceed 2 per day or 4 per week.   tolterodine 4 MG 24 hr capsule Commonly known as: DETROL LA Take 1 capsule (4 mg total) by mouth daily. Started by: Debroah Loop, PA-C   warfarin 2.5 MG tablet Commonly known as: Coumadin On Sundays and Thursdays resume the 2.5 mg dose.  All other days take 5 mg. What changed:  how much to take how to take this when to take this additional instructions   warfarin 5 MG tablet Commonly known as: COUMADIN Take by mouth. What changed: Another medication with the same name was changed. Make sure you understand how and when to take each.        Allergies:  Allergies  Allergen Reactions   Metronidazole Other (See Comments)    Other reaction(s): Other Mouth sores Mouth sores Mouth sores    Omeprazole Other (See Comments)    Other reaction(s): Arthralgias (intolerance) Other reaction(s): Other    Bactrim [Sulfamethoxazole-Trimethoprim] Nausea Only   Codeine Nausea And Vomiting and Nausea Only    Other reaction(s): Nausea Only But tolerates tramadol     Family History: Family History  Problem Relation Age of Onset   Breast cancer Neg Hx     Social History:   reports that she has never smoked. She has never used smokeless tobacco. She reports that she does not drink alcohol and does not use drugs.  Physical Exam: BP 139/84   Pulse (!) 105   Ht 5\' 1"  (1.549 m)   Wt 90 lb (40.8 kg)   BMI  17.01 kg/m   Constitutional:  Alert and oriented, no acute distress, nontoxic appearing HEENT: [place], AT Cardiovascular: No clubbing, cyanosis, or edema Respiratory: Normal respiratory effort, no increased work of breathing Skin: No rashes, bruises or suspicious lesions Neurologic: Grossly intact, no focal deficits, moving all 4 extremities Psychiatric: Normal mood and affect  Laboratory Data: Results for orders placed or performed in visit on 05/25/21  BLADDER SCAN AMB NON-IMAGING  Result Value Ref Range   Scan Result 6 mL   Assessment & Plan:   1. OAB (overactive bladder) Worsened urgency, frequency, and nocturia off pharmacotherapy.  I suspect her previously reported difficulty voiding and lower abdominal symptoms that prompted discontinuation of tolterodine were more likely associated with her impending volvulus and think it would be appropriate to restart tolterodine at this time.  Counseled the patient  to stop the medication immediately and contact our office if she again develops difficulty urinating on this medication.  We also discussed alternative therapies today including resuming Myrbetriq (90-day supply for mail order pharmacy is cheaper than 30-day supply from retail pharmacy), PTNS, intravesical Botox, and InterStim.  She is not interested in pursuing any of these, citing cost for Myrbetriq, concerns regarding PTNS efficacy with her BLE edema and scoliosis, hesitation to self catheterize, and hesitation to undergo further surgery.  I think this is reasonable. - Urinalysis, Complete - BLADDER SCAN AMB NON-IMAGING - tolterodine (DETROL LA) 4 MG 24 hr capsule; Take 1 capsule (4 mg total) by mouth daily.  Dispense: 30 capsule; Refill: 11  Return if symptoms worsen or fail to improve.  Debroah Loop, PA-C  [facility] [address] [place], [place] [address] [phone]

## 2021-05-25 NOTE — Telephone Encounter (Signed)
Per [name]: can you please call ms. Tallon and let her know that when I looked at her chart, it looks like the tolterodine was stopped the day before she had her volvulus, but I have a sneaking suspicion that the reason she was having urinary symptoms that day was because of the volvulus, not because of the medication. I still think she can restart tolterodine, but we'll just need to watch her closely and if she develops difficulty urinating on it, she should stop the med again.  Notified patient as advised, she verbalized understanding.

## 2021-06-28 ENCOUNTER — Encounter: Payer: Self-pay | Admitting: Cardiology

## 2021-06-28 ENCOUNTER — Other Ambulatory Visit: Payer: Self-pay

## 2021-06-28 ENCOUNTER — Ambulatory Visit (INDEPENDENT_AMBULATORY_CARE_PROVIDER_SITE_OTHER): Payer: Medicare Other | Admitting: Cardiology

## 2021-06-28 VITALS — BP 128/70 | HR 82 | Ht 61.0 in | Wt 86.0 lb

## 2021-06-28 DIAGNOSIS — Z86711 Personal history of pulmonary embolism: Secondary | ICD-10-CM | POA: Diagnosis not present

## 2021-06-28 DIAGNOSIS — R011 Cardiac murmur, unspecified: Secondary | ICD-10-CM

## 2021-06-28 NOTE — Progress Notes (Signed)
Cardiology Office Note:    Date:  06/28/2021   ID:  Kristina Rowe, DOB 1930-05-09, MRN [id number]  PCP:  Haydee Salter, MD   [place] Providers Cardiologist:  Kate Sable, MD     Referring MD: Haydee Salter, MD   Chief Complaint  Patient presents with   New Patient (Initial Visit)    Referred by PCP for Elevated HR and SOB. Meds reviewed verbally with patient     History of Present Illness:    Kristina Rowe is a 87 y.o. female with a hx of PE on Coumadin, hiatal hernia, colon cancer s/p resection who presents due to elevated heart rates and systolic murmur.   She has elevated heart rates over the past 1 to 2 years.  Denies palpitations, checks of blood pressure frequently at home, heart rates usually in daily low 90s.  Denies dizziness.  Recently had abdominal surgery due to volvulus, further work-up revealed colon cancer, underwent resection with clean margins 02/2021.  Has been told in the past she has some normal.  She denies chest pain, is a retired Therapist, sports.  Has a history of blood clots, family history of blood clots.  Was told after TEE 20+ years ago, she will be on Coumadin indefinitely.  Follows up at [facility] for INR checks.  Past Medical History:  Diagnosis Date   Ductal carcinoma in situ (DCIS) of left breast    Dysphagia    GERD (gastroesophageal reflux disease)    Hiatal hernia    Hypertension    Iron deficiency anemia    Osteoporosis    Pulmonary embolism ([place])    Scoliosis    UTI (urinary tract infection)     Past Surgical History:  Procedure Laterality Date   BREAST LUMPECTOMY Left 2009   Ductal Carcinoma insitu    CATARACT EXTRACTION, BILATERAL     COLONOSCOPY N/A 03/17/2021   Procedure: COLONOSCOPY;  Surgeon: Lesly Rubenstein, MD;  Location: [facility] ENDOSCOPY;  Service: Endoscopy;  Laterality: N/A;   COLOSTOMY REVISION Right 03/18/2021   Procedure: COLON RESECTION RIGHT;  Surgeon: Olean Ree, MD;  Location: [facility] ORS;  Service:  General;  Laterality: Right;   LAPAROSCOPIC PARAESOPHAGEAL HERNIA REPAIR  2009   LAPAROTOMY N/A 03/10/2021   Procedure: EXPLORATORY LAPAROTOMY;  Surgeon: Olean Ree, MD;  Location: [facility] ORS;  Service: General;  Laterality: N/A;   TUBAL LIGATION      Current Medications: Current Meds  Medication Sig   acetaminophen (TYLENOL) 500 MG tablet Take 500 mg by mouth at bedtime as needed for mild pain, fever or headache.   calcium carbonate (OS-CAL) 1250 (500 Ca) MG chewable tablet Chew 1 tablet by mouth daily.   Cholecalciferol 50 MCG (2000 UT) CAPS Take 1 capsule by mouth daily.   cyanocobalamin 1000 MCG tablet Take 1,000 mcg by mouth daily.   diclofenac Sodium (VOLTAREN) 1 % GEL Apply topically 4 (four) times daily.   econazole nitrate 1 % cream Apply 1 application topically daily.   estradiol (ESTRACE) 0.1 MG/GM vaginal cream Estrogen Cream Instruction Discard applicator Apply pea sized amount to tip of finger to urethra before bed. Wash hands well after application. Use Monday, Wednesday and Friday   folic acid (FOLVITE) 1 MG tablet Take 1 mg by mouth daily.   loperamide (IMODIUM) 2 MG capsule Take 2 mg by mouth as needed for diarrhea or loose stools.   ondansetron (ZOFRAN ODT) 4 MG disintegrating tablet Take 1 tablet (4 mg total) by mouth every 8 (eight)  hours as needed for nausea or vomiting.   pantoprazole (PROTONIX) 40 MG tablet Take 40 mg by mouth daily.   promethazine (PHENERGAN) 25 MG tablet Take 25 mg by mouth every 6 (six) hours as needed for nausea or vomiting.   SUMAtriptan (IMITREX) 50 MG tablet Take 50 mg by mouth as directed. Take 1 at onset of headaTake 1 at onset of headache. May repeat once in two hours. Do not exceed 2 per day or 4 per week.   warfarin (COUMADIN) 2.5 MG tablet On Sundays and Thursdays resume the 2.5 mg dose.  All other days take 5 mg. (Patient taking differently: Take 2.5-5 mg by mouth daily at 12 noon. On Sundays, Tuesday, and Thursdays resume the 2.5 mg dose.   All other days take 5 mg.)   warfarin (COUMADIN) 5 MG tablet Take by mouth.     Allergies:   Metronidazole, Omeprazole, Bactrim [sulfamethoxazole-trimethoprim], and Codeine   Social History   Socioeconomic History   Marital status: Married    Spouse name: Not on file   Number of children: Not on file   Years of education: Not on file   Highest education level: Not on file  Occupational History   Not on file  Tobacco Use   Smoking status: Never   Smokeless tobacco: Never  Substance and Sexual Activity   Alcohol use: Never   Drug use: Never   Sexual activity: Not Currently  Other Topics Concern   Not on file  Social History Narrative   Not on file   Social Determinants of Health   Financial Resource Strain: Not on file  Food Insecurity: Not on file  Transportation Needs: Not on file  Physical Activity: Not on file  Stress: Not on file  Social Connections: Not on file     Family History: The patient's family history is negative for Breast cancer.  ROS:   Please see the history of present illness.     All other systems reviewed and are negative.  EKGs/Labs/Other Studies Reviewed:    The following studies were reviewed today:   EKG:  EKG is  ordered today.  The ekg ordered today demonstrates normal sinus rhythm  Recent Labs: 03/11/2021: Magnesium 1.7 04/01/2021: TSH 0.454 04/23/2021: ALT 11; BUN 19; Creatinine, Ser 0.80; Potassium 4.5; Sodium 136 05/14/2021: Hemoglobin 10.4; Platelets 393  Recent Lipid Panel No results found for: CHOL, TRIG, HDL, CHOLHDL, VLDL, LDLCALC, LDLDIRECT   Risk Assessment/Calculations:          Physical Exam:    VS:  BP 128/70 (BP Location: Left Arm, Patient Position: Sitting, Cuff Size: Normal)   Pulse 82   Ht '5\' 1"'$  (1.549 m)   Wt 86 lb (39 kg)   SpO2 99%   BMI 16.25 kg/m     Wt Readings from Last 3 Encounters:  06/28/21 86 lb (39 kg)  05/25/21 90 lb (40.8 kg)  04/15/21 85 lb 6.4 oz (38.7 kg)     GEN: Appears frail,  no acute distress HEENT: Normal NECK: No JVD; No carotid bruits LYMPHATICS: No lymphadenopathy CARDIAC: RRR, 2/6 systolic murmur RESPIRATORY:  Clear to auscultation without rales, wheezing or rhonchi  ABDOMEN: Soft, non-tender, non-distended MUSCULOSKELETAL:  No edema; No deformity  SKIN: Warm and dry NEUROLOGIC:  Alert and oriented x 3 PSYCHIATRIC:  Normal affect   ASSESSMENT:    1. Systolic murmur   2. Hx of pulmonary embolus    PLAN:    In order of problems listed above:  Systolic  murmur, obtain echo to evaluate any valvular or structural abnormality.  Heart rates not elevated, no indication for additional testing or cardiac monitor at this time. History of PE, family history of blood clots, on Coumadin.  Follow-up after echocardiogram     Medication Adjustments/Labs and Tests Ordered: Current medicines are reviewed at length with the patient today.  Concerns regarding medicines are outlined above.  Orders Placed This Encounter  Procedures   EKG 12-Lead   ECHOCARDIOGRAM COMPLETE    No orders of the defined types were placed in this encounter.   Patient Instructions  Medication Instructions:  - Your physician recommends that you continue on your current medications as directed. Please refer to the Current Medication list given to you today.  *If you need a refill on your cardiac medications before your next appointment, please call your pharmacy*   Lab Work: - none ordered  If you have labs (blood work) drawn today and your tests are completely normal, you will receive your results only by: [place] (if you have MyChart) OR A paper copy in the mail If you have any lab test that is abnormal or we need to change your treatment, we will call you to review the results.   Testing/Procedures: - Your physician has requested that you have an echocardiogram. Echocardiography is a painless test that uses sound waves to create images of your heart. It provides  your doctor with information about the size and shape of your heart and how well your heart's chambers and valves are working. This procedure takes approximately one hour. There are no restrictions for this procedure. There is a possibility that an IV may need to be started during your test to inject an image enhancing agent. This is done to obtain more optimal pictures of your heart. Therefore we ask that you do at least drink some water prior to coming in to hydrate your veins.     Follow-Up: At [facility], you and your health needs are our priority.  As part of our continuing mission to provide you with exceptional heart care, we have created designated Provider Care Teams.  These Care Teams include your primary Cardiologist (physician) and Advanced Practice Providers (APPs -  Physician Assistants and Nurse Practitioners) who all work together to provide you with the care you need, when you need it.  We recommend signing up for the patient portal called "MyChart".  Sign up information is provided on this After Visit Summary.  MyChart is used to connect with patients for Virtual Visits (Telemedicine).  Patients are able to view lab/test results, encounter notes, upcoming appointments, etc.  Non-urgent messages can be sent to your provider as well.   To learn more about what you can do with MyChart, go to [email or web address].    Your next appointment:   After testing is completed   The format for your next appointment:   In Person  Provider:   You may see Kate Sable, MD or one of the following Advanced Practice Providers on your designated Care Team:   Murray Hodgkins, NP Christell Faith, PA-C Marrianne Mood, PA-C Cadence Kathlen Mody, [place]   Other Instructions  Echocardiogram An echocardiogram is a test that uses sound waves (ultrasound) to produce images of the heart. Images from an echocardiogram can provide important information about: Heart size and shape. The size and  thickness and movement of your heart's walls. Heart muscle function and strength. Heart valve function or if you have stenosis. Stenosis is when the heart  valves are too narrow. If blood is flowing backward through the heart valves (regurgitation). A tumor or infectious growth around the heart valves. Areas of heart muscle that are not working well because of poor blood flow or injury from a heart attack. Aneurysm detection. An aneurysm is a weak or damaged part of an artery wall. The wall bulges out from the normal force of blood pumping through the body. Tell a health care provider about: Any allergies you have. All medicines you are taking, including vitamins, herbs, eye drops, creams, and over-the-counter medicines. Any blood disorders you have. Any surgeries you have had. Any medical conditions you have. Whether you are pregnant or may be pregnant. What are the risks? Generally, this is a safe test. However, problems may occur, including an allergic reaction to dye (contrast) that may be used during the test. What happens before the test? No specific preparation is needed. You may eat and drink normally. What happens during the test?  You will take off your clothes from the waist up and put on a hospital gown. Electrodes or electrocardiogram (ECG)patches may be placed on your chest. The electrodes or patches are then connected to a device that monitors your heart rate and rhythm. You will lie down on a table for an ultrasound exam. A gel will be applied to your chest to help sound waves pass through your skin. A handheld device, called a transducer, will be pressed against your chest and moved over your heart. The transducer produces sound waves that travel to your heart and bounce back (or "echo" back) to the transducer. These sound waves will be captured in real-time and changed into images of your heart that can be viewed on a video monitor. The images will be recorded on a computer  and reviewed by your health care provider. You may be asked to change positions or hold your breath for a short time. This makes it easier to get different views or better views of your heart. In some cases, you may receive contrast through an IV in one of your veins. This can improve the quality of the pictures from your heart. The procedure may vary among health care providers and hospitals. What can I expect after the test? You may return to your normal, everyday life, including diet, activities, andmedicines, unless your health care provider tells you not to do that. Follow these instructions at home: It is up to you to get the results of your test. Ask your health care provider, or the department that is doing the test, when your results will be ready. Keep all follow-up visits. This is important. Summary An echocardiogram is a test that uses sound waves (ultrasound) to produce images of the heart. Images from an echocardiogram can provide important information about the size and shape of your heart, heart muscle function, heart valve function, and other possible heart problems. You do not need to do anything to prepare before this test. You may eat and drink normally. After the echocardiogram is completed, you may return to your normal, everyday life, unless your health care provider tells you not to do that. This information is not intended to replace advice given to you by your health care provider. Make sure you discuss any questions you have with your healthcare provider. Document Revised: 07/07/2020 Document Reviewed: 07/07/2020 Elsevier Patient Education  2022 [place], Kate Sable, MD  06/28/2021 5:04 PM    [place]

## 2021-06-28 NOTE — Patient Instructions (Signed)
Medication Instructions:  - Your physician recommends that you continue on your current medications as directed. Please refer to the Current Medication list given to you today.  *If you need a refill on your cardiac medications before your next appointment, please call your pharmacy*   Lab Work: - none ordered  If you have labs (blood work) drawn today and your tests are completely normal, you will receive your results only by: [place] (if you have MyChart) OR A paper copy in the mail If you have any lab test that is abnormal or we need to change your treatment, we will call you to review the results.   Testing/Procedures: - Your physician has requested that you have an echocardiogram. Echocardiography is a painless test that uses sound waves to create images of your heart. It provides your doctor with information about the size and shape of your heart and how well your heart's chambers and valves are working. This procedure takes approximately one hour. There are no restrictions for this procedure. There is a possibility that an IV may need to be started during your test to inject an image enhancing agent. This is done to obtain more optimal pictures of your heart. Therefore we ask that you do at least drink some water prior to coming in to hydrate your veins.     Follow-Up: At [facility], you and your health needs are our priority.  As part of our continuing mission to provide you with exceptional heart care, we have created designated Provider Care Teams.  These Care Teams include your primary Cardiologist (physician) and Advanced Practice Providers (APPs -  Physician Assistants and Nurse Practitioners) who all work together to provide you with the care you need, when you need it.  We recommend signing up for the patient portal called "MyChart".  Sign up information is provided on this After Visit Summary.  MyChart is used to connect with patients for Virtual Visits  (Telemedicine).  Patients are able to view lab/test results, encounter notes, upcoming appointments, etc.  Non-urgent messages can be sent to your provider as well.   To learn more about what you can do with MyChart, go to [email or web address].    Your next appointment:   After testing is completed   The format for your next appointment:   In Person  Provider:   You may see Kate Sable, MD or one of the following Advanced Practice Providers on your designated Care Team:   Murray Hodgkins, NP Christell Faith, PA-C Marrianne Mood, PA-C Cadence Kathlen Mody, [place]   Other Instructions  Echocardiogram An echocardiogram is a test that uses sound waves (ultrasound) to produce images of the heart. Images from an echocardiogram can provide important information about: Heart size and shape. The size and thickness and movement of your heart's walls. Heart muscle function and strength. Heart valve function or if you have stenosis. Stenosis is when the heart valves are too narrow. If blood is flowing backward through the heart valves (regurgitation). A tumor or infectious growth around the heart valves. Areas of heart muscle that are not working well because of poor blood flow or injury from a heart attack. Aneurysm detection. An aneurysm is a weak or damaged part of an artery wall. The wall bulges out from the normal force of blood pumping through the body. Tell a health care provider about: Any allergies you have. All medicines you are taking, including vitamins, herbs, eye drops, creams, and over-the-counter medicines. Any blood disorders you have. Any  surgeries you have had. Any medical conditions you have. Whether you are pregnant or may be pregnant. What are the risks? Generally, this is a safe test. However, problems may occur, including an allergic reaction to dye (contrast) that may be used during the test. What happens before the test? No specific preparation is needed. You may  eat and drink normally. What happens during the test?  You will take off your clothes from the waist up and put on a hospital gown. Electrodes or electrocardiogram (ECG)patches may be placed on your chest. The electrodes or patches are then connected to a device that monitors your heart rate and rhythm. You will lie down on a table for an ultrasound exam. A gel will be applied to your chest to help sound waves pass through your skin. A handheld device, called a transducer, will be pressed against your chest and moved over your heart. The transducer produces sound waves that travel to your heart and bounce back (or "echo" back) to the transducer. These sound waves will be captured in real-time and changed into images of your heart that can be viewed on a video monitor. The images will be recorded on a computer and reviewed by your health care provider. You may be asked to change positions or hold your breath for a short time. This makes it easier to get different views or better views of your heart. In some cases, you may receive contrast through an IV in one of your veins. This can improve the quality of the pictures from your heart. The procedure may vary among health care providers and hospitals. What can I expect after the test? You may return to your normal, everyday life, including diet, activities, andmedicines, unless your health care provider tells you not to do that. Follow these instructions at home: It is up to you to get the results of your test. Ask your health care provider, or the department that is doing the test, when your results will be ready. Keep all follow-up visits. This is important. Summary An echocardiogram is a test that uses sound waves (ultrasound) to produce images of the heart. Images from an echocardiogram can provide important information about the size and shape of your heart, heart muscle function, heart valve function, and other possible heart problems. You do  not need to do anything to prepare before this test. You may eat and drink normally. After the echocardiogram is completed, you may return to your normal, everyday life, unless your health care provider tells you not to do that. This information is not intended to replace advice given to you by your health care provider. Make sure you discuss any questions you have with your healthcare provider. Document Revised: 07/07/2020 Document Reviewed: 07/07/2020 Elsevier Patient Education  2022 [employer].

## 2021-07-14 ENCOUNTER — Encounter: Payer: Self-pay | Admitting: Surgery

## 2021-07-14 ENCOUNTER — Other Ambulatory Visit: Payer: Self-pay

## 2021-07-14 ENCOUNTER — Ambulatory Visit (INDEPENDENT_AMBULATORY_CARE_PROVIDER_SITE_OTHER): Payer: Medicare Other | Admitting: Surgery

## 2021-07-14 VITALS — BP 133/74 | HR 86 | Temp 97.7°F | Ht 61.0 in | Wt 88.0 lb

## 2021-07-14 DIAGNOSIS — C189 Malignant neoplasm of colon, unspecified: Secondary | ICD-10-CM

## 2021-07-14 NOTE — Patient Instructions (Addendum)
If you have any concerns or questions, please feel free to call our office. See follow up appointment below.   [email or web address]">   High-Protein and High-Calorie Diet  Eating high-protein and high-calorie foods can help you to gain weight, heal after an injury, and recover after an illness or surgery. The specific amount of daily protein and calories you need depends on: Your body weight. The reason this diet is recommended for you. Generally, a high-protein, high-calorie diet involves: Eating 250-500 extra calories each day. Making sure that you get enough of your daily calories from protein. Ask your health care provider how many of your calories should come from protein. Talk with a health care provider or a dietitian about how much protein and how many calories you need each day. Follow the diet as directed by your healthcare provider. What are tips for following this plan?  Reading food labels Check the nutrition facts label for calories, grams of fat and protein. Items with more than 4 grams of protein are high-protein foods. Preparing meals Add whole milk, half-and-half, or heavy cream to cereal, pudding, soup, or hot cocoa. Add whole milk to instant breakfast drinks. Add peanut butter to oatmeal or smoothies. Add powdered milk to baked goods, smoothies, or milkshakes. Add powdered milk, cream, or butter to mashed potatoes. Add cheese to cooked vegetables. Make whole-milk yogurt parfaits. Top them with granola, fruit, or nuts. Add cottage cheese to fruit. Add avocado, cheese, or both to sandwiches or salads. Add avocado to smoothies. Add meat, poultry, or seafood to rice, pasta, casseroles, salads, and soups. Use mayonnaise when making egg salad, chicken salad, or tuna salad. Use peanut butter as a dip for fruits and vegetables or as a topping for pretzels, celery, or crackers. Add beans  to casseroles, dips, and spreads. Add pureed beans to sauces and soups. Replace calorie-free drinks with calorie-containing drinks, such as milk and fruit juice. Replace water with milk or heavy cream when making foods such as oatmeal, pudding, or cocoa. Add oil or butter to cooked vegetables and grains. Add cream cheese to sandwiches or as a topping on crackers and bread. Make cream-based pastas and soups. General information Ask your health care provider if you should take a nutritional supplement. Try to eat six small meals each day instead of three large meals. A general goal is to eat every 2 to 3 hours. Eat a balanced diet. In each meal, include one food that is high in protein and one food with fat in it. Keep nutritious snacks available, such as nuts, trail mixes, dried fruit, and yogurt. If you have kidney disease or diabetes, talk with your health care provider about how much protein is safe for you. Too much protein may put extra stress on your kidneys. Drink your calories. Choose high-calorie drinks and have them after your meals. Consider setting a timer to remind you to eat. You will want to eat even if you do not feel very hungry. What high-protein foods should I eat?  Vegetables Soybeans. Peas. Grains Quinoa. Bulgur wheat. Buckwheat. Meats and other proteins Beef, pork, and poultry. Fish and seafood. Eggs. Tofu. Textured vegetable protein (TVP). Peanut butter. Nuts and seeds. Dried beans. Protein powders.Hummus. Dairy Whole milk. Whole-milk yogurt. Powdered milk. Cheese. Yahoo. Eggnog. Beverages High-protein supplement drinks. Soy milk. Other foods Protein bars. The items listed above may not be a complete list of foods and beverages you can eat and drink. Contact a dietitian for more information. What high-calorie foods should I  eat? Fruits Dried fruit. Fruit leather. Canned fruit in syrup. Fruit juice. Avocado. Vegetables Vegetables cooked in oil or  butter. Fried potatoes. Grains Pasta. Quick breads. Muffins. Pancakes. Ready-to-eat cereal. Meats and other proteins Peanut butter. Nuts and seeds. Dairy Heavy cream. Whipped cream. Cream cheese. Sour cream. Ice cream. Custard.Pudding. Whole milk dairy products. Beverages Meal-replacement beverages. Nutrition shakes. Fruit juice. Seasonings and condiments Salad dressing. Mayonnaise. Alfredo sauce. Fruit preserves or jelly. Honey.Syrup. Sweets and desserts Cake. Cookies. Pie. Pastries. Candy bars. Chocolate. Fats and oils Butter or margarine. Oil. Gravy. Other foods Meal-replacement bars. The items listed above may not be a complete list of foods and beverages you can eat and drink. Contact a dietitian for more information. Summary A high-protein, high-calorie diet can help you gain weight or heal faster after an injury, illness, or surgery. To increase your protein and calories, add ingredients such as whole milk, peanut butter, cheese, beans, meat, or seafood to meal items. To get enough extra calories each day, include high-calorie foods and beverages at each meal. Adding a high-calorie drink or shake can be an easy way to help you get enough calories each day. Talk with your healthcare provider or dietitian about the best options for you. This information is not intended to replace advice given to you by your health care provider. Make sure you discuss any questions you have with your healthcare provider. Document Revised: 10/18/2020 Document Reviewed: 10/18/2020 Elsevier Patient Education  2022 [employer].

## 2021-07-14 NOTE — Progress Notes (Signed)
07/14/2021  History of Present Illness: Kristina Rowe is a 87 y.o. female s/p exploratory laparotomy for small bowel volvulus on 03/10/21, followed by open right colectomy for right colon cancer on 03/18/21.  She presents today for 4 month follow up.  She has been doing well.  She reports that she's being more active and trying to walk more. She's eating well and have much better appetite.  However, she's still not gaining much weight.  Denies any abdominal pain, nausea, vomiting, diarrhea, constipation, or blood in her stools.  She did have some initial loose stools after surgery, but that is back to her baseline now.  Past Medical History: Past Medical History:  Diagnosis Date   Ductal carcinoma in situ (DCIS) of left breast    Dysphagia    GERD (gastroesophageal reflux disease)    Hiatal hernia    Hypertension    Iron deficiency anemia    Osteoporosis    Pulmonary embolism (HCC)    Scoliosis    UTI (urinary tract infection)      Past Surgical History: Past Surgical History:  Procedure Laterality Date   BREAST LUMPECTOMY Left 2009   Ductal Carcinoma insitu    CATARACT EXTRACTION, BILATERAL     COLONOSCOPY N/A 03/17/2021   Procedure: COLONOSCOPY;  Surgeon: Lesly Rubenstein, MD;  Location: [facility] END[facility];  Service: Endoscopy;  Laterality: N/A;   COLOSTOMY REVISION Right 03/18/2021   Procedure: COLON RESECTION RIGHT;  Surgeon: Olean Ree, MD;  Location: [facility] ORS;  Service: General;  Laterality: Right;   LAPAROSCOPIC PARAESOPHAGEAL HERNIA REPAIR  2009   LAPAROTOMY N/A 03/10/2021   Procedure: EXPLORATORY LAPAROTOMY;  Surgeon: Olean Ree, MD;  Location: [facility] ORS;  Service: General;  Laterality: N/A;   TUBAL LIGATION      Home Medications: Prior to Admission medications   Medication Sig Start Date End Date Taking? Authorizing Provider  acetaminophen (TYLENOL) 500 MG tablet Take 500 mg by mouth at bedtime as needed for mild pain, fever or headache.   Yes [provider]  calcium carbonate (OS-CAL) 1250 (500 Ca) MG chewable tablet Chew 1 tablet by mouth daily. 04/20/09  Yes [provider]  Cholecalciferol 50 MCG (2000 UT) CAPS Take 1 capsule by mouth daily.   Yes [provider]  cyanocobalamin 1000 MCG tablet Take 1,000 mcg by mouth daily.   Yes [provider]  diclofenac Sodium (VOLTAREN) 1 % GEL Apply topically 4 (four) times daily.   Yes [provider]  econazole nitrate 1 % cream Apply 1 application topically daily.   Yes [provider]  estradiol (ESTRACE) 0.1 MG/GM vaginal cream Estrogen Cream Instruction Discard applicator Apply pea sized amount to tip of finger to urethra before bed. Wash hands well after application. Use Monday, Wednesday and Friday 03/04/21  Yes Billey Co, MD  folic acid (FOLVITE) 1 MG tablet Take 1 mg by mouth daily. 01/17/19  Yes [provider]  loperamide (IMODIUM) 2 MG capsule Take 2 mg by mouth as needed for diarrhea or loose stools.   Yes [provider]  mirtazapine (REMERON) 30 MG tablet Take 30 mg by mouth at bedtime. 03/02/21  Yes [provider]  ondansetron (ZOFRAN ODT) 4 MG disintegrating tablet Take 1 tablet (4 mg total) by mouth every 8 (eight) hours as needed for nausea or vomiting. 04/30/21  Yes Janvi Ammar, Jacqulyn Bath, MD  pantoprazole (PROTONIX) 40 MG tablet Take 40 mg by mouth daily.   Yes [provider]  promethazine (PHENERGAN)  25 MG tablet Take 25 mg by mouth every 6 (six) hours as needed for nausea or vomiting.   Yes [provider]  SUMAtriptan (IMITREX) 50 MG tablet Take 50 mg by mouth as directed. Take 1 at onset of headaTake 1 at onset of headache. May repeat once in two hours. Do not exceed 2 per day or 4 per week. 03/15/19  Yes [provider]  tolterodine (DETROL LA) 4 MG 24 hr capsule Take 1 capsule (4 mg total) by mouth daily. 05/25/21  Yes Vaillancourt, Aldona Bar, PA-C  warfarin (COUMADIN) 2.5 MG tablet On Sundays  and Thursdays resume the 2.5 mg dose.  All other days take 5 mg. Patient taking differently: Take 2.5-5 mg by mouth daily at 12 noon. On Sundays, Tuesday, and Thursdays resume the 2.5 mg dose.  All other days take 5 mg. 03/14/21  Yes Ronny Bacon, MD  warfarin (COUMADIN) 5 MG tablet Take by mouth. 03/26/21  Yes [provider]    Allergies: Allergies  Allergen Reactions   Metronidazole Other (See Comments)    Other reaction(s): Other Mouth sores Mouth sores Mouth sores    Omeprazole Other (See Comments)    Other reaction(s): Arthralgias (intolerance) Other reaction(s): Other    Bactrim [Sulfamethoxazole-Trimethoprim] Nausea Only   Codeine Nausea And Vomiting and Nausea Only    Other reaction(s): Nausea Only But tolerates tramadol     Review of Systems: Review of Systems  Constitutional:  Negative for chills and fever.  Respiratory:  Negative for shortness of breath.   Cardiovascular:  Negative for chest pain.  Gastrointestinal:  Negative for abdominal pain, constipation, diarrhea, nausea and vomiting.   Physical Exam BP 133/74   Pulse 86   Temp 97.7 F (36.5 C) (Oral)   Ht '5\' 1"'$  (1.549 m)   Wt 88 lb (39.9 kg)   SpO2 99%   BMI 16.63 kg/m  CONSTITUTIONAL: No acute distress HEENT:  Normocephalic, atraumatic, extraocular motion intact. RESPIRATORY:  Lungs are clear, and breath sounds are equal bilaterally. Normal respiratory effort without pathologic use of accessory muscles. CARDIOVASCULAR: Heart is regular with systolic murmur GI: The abdomen is soft, non-distended, non-tender.  Midline incision is well healed, without any evidence of hernia. There were no palpable masses. NEUROLOGIC:  Motor and sensation is grossly normal.  Cranial nerves are grossly intact. PSYCH:  Alert and oriented to person, place and time. Affect is normal.  Labs/Imaging: Labs from 05/14/21: WBC 6.2, Hgb 10.4, Hct 32.7, Plt 393.  Iron 49, Ferritin 134, TIBC 204.  Assessment and  Plan: This is a 87 y.o. female s/p exlap for small bowel volvulus, followed by open right colectomy for colon cancer.  --Patient is doing well, increasing her exercise endurance and mobility.  Her appetite is much better now, though she has not gained much weight.  Exam today is reassuring. --Encouraged the patient to increase her po intake of protein in her diet.  She can also continue her exercises and has no activity restrictions. --Follow up in 4 months.  She has a CT scan c/a/p scheduled on 12/12.  She can follow after to review results and check on her overall progress.  Face-to-face time spent with the patient and care providers was 25 minutes, with more than 50% of the time spent counseling, educating, and coordinating care of the patient.     [name], [facility]

## 2021-07-26 ENCOUNTER — Telehealth: Payer: Self-pay | Admitting: Surgery

## 2021-07-26 NOTE — Telephone Encounter (Signed)
Patient is calling and is asking when she is suppose to have blood work done with Dr. Hampton Abbot. Please call patient and advise.

## 2021-07-27 ENCOUNTER — Other Ambulatory Visit
Admission: RE | Admit: 2021-07-27 | Discharge: 2021-07-27 | Disposition: A | Discharge status: Home / Self Care | Payer: Medicare Other | Attending: Surgery | Admitting: Surgery

## 2021-07-27 DIAGNOSIS — C189 Malignant neoplasm of colon, unspecified: Secondary | ICD-10-CM | POA: Diagnosis present

## 2021-07-27 DIAGNOSIS — D509 Iron deficiency anemia, unspecified: Secondary | ICD-10-CM | POA: Diagnosis present

## 2021-07-27 LAB — CBC WITH DIFFERENTIAL/PLATELET
Abs Immature Granulocytes: 0.02 10*3/uL (ref 0.00–0.07)
Basophils Absolute: 0 10*3/uL (ref 0.0–0.1)
Basophils Relative: 1 %
Eosinophils Absolute: 0 10*3/uL (ref 0.0–0.5)
Eosinophils Relative: 0 %
HCT: 29.2 % — ABNORMAL LOW (ref 36.0–46.0)
Hemoglobin: 9.5 g/dL — ABNORMAL LOW (ref 12.0–15.0)
Immature Granulocytes: 0 %
Lymphocytes Relative: 14 %
Lymphs Abs: 0.8 10*3/uL (ref 0.7–4.0)
MCH: 33.1 pg (ref 26.0–34.0)
MCHC: 32.5 g/dL (ref 30.0–36.0)
MCV: 101.7 fL — ABNORMAL HIGH (ref 80.0–100.0)
Monocytes Absolute: 0.4 10*3/uL (ref 0.1–1.0)
Monocytes Relative: 6 %
Neutro Abs: 4.9 10*3/uL (ref 1.7–7.7)
Neutrophils Relative %: 79 %
Platelets: 311 10*3/uL (ref 150–400)
RBC: 2.87 MIL/uL — ABNORMAL LOW (ref 3.87–5.11)
RDW: 12.8 % (ref 11.5–15.5)
WBC: 6.2 10*3/uL (ref 4.0–10.5)
nRBC: 0 % (ref 0.0–0.2)

## 2021-07-27 LAB — IRON AND TIBC
Iron: 29 ug/dL (ref 28–170)
Saturation Ratios: 8 % — ABNORMAL LOW (ref 10.4–31.8)
TIBC: 389 ug/dL (ref 250–450)
UIBC: 360 ug/dL

## 2021-07-27 LAB — FERRITIN: Ferritin: 12 ng/mL (ref 11–307)

## 2021-07-28 LAB — CEA: CEA: 1.4 ng/mL (ref 0.0–4.7)

## 2021-07-28 NOTE — Telephone Encounter (Signed)
Incoming call from patient, she is calling for her CEA results, please call her.  Thank you.

## 2021-07-30 ENCOUNTER — Other Ambulatory Visit: Payer: Self-pay

## 2021-07-30 ENCOUNTER — Inpatient Hospital Stay: Payer: Medicare Other | Attending: Oncology

## 2021-07-30 ENCOUNTER — Other Ambulatory Visit: Payer: Self-pay | Admitting: Oncology

## 2021-07-30 VITALS — BP 109/57 | HR 77 | Temp 97.6°F | Resp 18

## 2021-07-30 DIAGNOSIS — D508 Other iron deficiency anemias: Secondary | ICD-10-CM

## 2021-07-30 DIAGNOSIS — D509 Iron deficiency anemia, unspecified: Secondary | ICD-10-CM | POA: Diagnosis not present

## 2021-07-30 MED ORDER — SODIUM CHLORIDE 0.9 % IV SOLN
Freq: Once | INTRAVENOUS | Status: AC
  Filled 2021-07-30: qty 250

## 2021-07-30 MED ORDER — SODIUM CHLORIDE 0.9 % IV SOLN
510.0000 mg | INTRAVENOUS | Status: DC
  Administered 2021-07-30: 510 mg via INTRAVENOUS
  Filled 2021-07-30: qty 510

## 2021-07-30 NOTE — Patient Instructions (Signed)
[facility]   Discharge Instructions: Thank you for choosing [facility] to provide your oncology and hematology care.  If you have a lab appointment with the [facility], please go directly to the [facility] and check in at the registration area.  We strive to give you quality time with your provider. You may need to reschedule your appointment if you arrive late (15 or more minutes).  Arriving late affects you and other patients whose appointments are after yours.  Also, if you miss three or more appointments without notifying the office, you may be dismissed from the clinic at the provider's discretion.      For prescription refill requests, have your pharmacy contact our office and allow 72 hours for refills to be completed.    Today you received the following: Feraheme.      BELOW ARE SYMPTOMS THAT SHOULD BE REPORTED IMMEDIATELY: *FEVER GREATER THAN 100.4 F (38 C) OR HIGHER *CHILLS OR SWEATING *NAUSEA AND VOMITING THAT IS NOT CONTROLLED WITH YOUR NAUSEA MEDICATION *UNUSUAL SHORTNESS OF BREATH *UNUSUAL BRUISING OR BLEEDING *URINARY PROBLEMS (pain or burning when urinating, or frequent urination) *BOWEL PROBLEMS (unusual diarrhea, constipation, pain near the anus) TENDERNESS IN MOUTH AND THROAT WITH OR WITHOUT PRESENCE OF ULCERS (sore throat, sores in mouth, or a toothache) UNUSUAL RASH, SWELLING OR PAIN  UNUSUAL VAGINAL DISCHARGE OR ITCHING   Items with * indicate a potential emergency and should be followed up as soon as possible or go to the Emergency Department if any problems should occur.  Should you have questions after your visit or need to cancel or reschedule your appointment, please contact [facility]  [phone] and follow the prompts.  Office hours are 8:00 a.m. to 4:30 p.m. Monday - Friday. Please note that voicemails left after 4:00 p.m. may not be returned until the following  business day.  We are closed weekends and major holidays. You have access to a nurse at all times for urgent questions. Please call the main number to the clinic [phone] and follow the prompts.  For any non-urgent questions, you may also contact your provider using MyChart. We now offer e-Visits for anyone 40 and older to request care online for non-urgent symptoms. For details visit [email or web address].   Also download the MyChart app! Go to the app store, search "MyChart", open the app, select [facility], and log in with your MyChart username and password.  Due to Covid, a mask is required upon entering the hospital/clinic. If you do not have a mask, one will be given to you upon arrival. For doctor visits, patients may have 1 support person aged 51 or older with them. For treatment visits, patients cannot have anyone with them due to current Covid guidelines and our immunocompromised population.

## 2021-08-10 ENCOUNTER — Inpatient Hospital Stay: Payer: Medicare Other

## 2021-08-10 VITALS — BP 111/52 | HR 88 | Temp 97.1°F | Resp 18

## 2021-08-10 DIAGNOSIS — D509 Iron deficiency anemia, unspecified: Secondary | ICD-10-CM | POA: Diagnosis not present

## 2021-08-10 DIAGNOSIS — D508 Other iron deficiency anemias: Secondary | ICD-10-CM

## 2021-08-10 MED ORDER — SODIUM CHLORIDE 0.9 % IV SOLN
510.0000 mg | INTRAVENOUS | Status: DC
  Administered 2021-08-10: 510 mg via INTRAVENOUS
  Filled 2021-08-10: qty 510

## 2021-08-10 MED ORDER — SODIUM CHLORIDE 0.9 % IV SOLN
Freq: Once | INTRAVENOUS | Status: AC
  Filled 2021-08-10: qty 250

## 2021-08-12 ENCOUNTER — Ambulatory Visit: Payer: Medicare Other | Admitting: Physician Assistant

## 2021-08-18 ENCOUNTER — Ambulatory Visit (INDEPENDENT_AMBULATORY_CARE_PROVIDER_SITE_OTHER): Payer: Medicare Other

## 2021-08-18 ENCOUNTER — Other Ambulatory Visit: Payer: Self-pay

## 2021-08-18 DIAGNOSIS — R011 Cardiac murmur, unspecified: Secondary | ICD-10-CM | POA: Diagnosis not present

## 2021-08-18 LAB — ECHOCARDIOGRAM COMPLETE
AR max vel: 0.89 cm2
AV Area VTI: 1 cm2
AV Area mean vel: 0.93 cm2
AV Mean grad: 10.6 mmHg
AV Peak grad: 22.1 mmHg
Ao pk vel: 2.35 m/s
Area-P 1/2: 1.91 cm2
Calc EF: 55.9 %
Single Plane A2C EF: 54.5 %
Single Plane A4C EF: 55.6 %

## 2021-08-19 ENCOUNTER — Encounter: Payer: Self-pay | Admitting: Nurse Practitioner

## 2021-08-19 ENCOUNTER — Inpatient Hospital Stay (HOSPITAL_BASED_OUTPATIENT_CLINIC_OR_DEPARTMENT_OTHER): Payer: Medicare Other | Admitting: Nurse Practitioner

## 2021-08-19 ENCOUNTER — Other Ambulatory Visit: Payer: Medicare Other

## 2021-08-19 ENCOUNTER — Inpatient Hospital Stay: Payer: Medicare Other

## 2021-08-19 ENCOUNTER — Other Ambulatory Visit: Payer: Self-pay | Admitting: *Deleted

## 2021-08-19 VITALS — BP 132/81 | HR 87 | Temp 97.8°F | Resp 16 | Ht 61.0 in | Wt 89.9 lb

## 2021-08-19 DIAGNOSIS — Z08 Encounter for follow-up examination after completed treatment for malignant neoplasm: Secondary | ICD-10-CM | POA: Diagnosis not present

## 2021-08-19 DIAGNOSIS — Z85038 Personal history of other malignant neoplasm of large intestine: Secondary | ICD-10-CM

## 2021-08-19 DIAGNOSIS — D509 Iron deficiency anemia, unspecified: Secondary | ICD-10-CM

## 2021-08-19 LAB — CBC WITH DIFFERENTIAL/PLATELET
Abs Immature Granulocytes: 0.02 10*3/uL (ref 0.00–0.07)
Basophils Absolute: 0 10*3/uL (ref 0.0–0.1)
Basophils Relative: 1 %
Eosinophils Absolute: 0 10*3/uL (ref 0.0–0.5)
Eosinophils Relative: 0 %
HCT: 33.9 % — ABNORMAL LOW (ref 36.0–46.0)
Hemoglobin: 10.5 g/dL — ABNORMAL LOW (ref 12.0–15.0)
Immature Granulocytes: 0 %
Lymphocytes Relative: 17 %
Lymphs Abs: 0.9 10*3/uL (ref 0.7–4.0)
MCH: 32.6 pg (ref 26.0–34.0)
MCHC: 31 g/dL (ref 30.0–36.0)
MCV: 105.3 fL — ABNORMAL HIGH (ref 80.0–100.0)
Monocytes Absolute: 0.2 10*3/uL (ref 0.1–1.0)
Monocytes Relative: 5 %
Neutro Abs: 4 10*3/uL (ref 1.7–7.7)
Neutrophils Relative %: 77 %
Platelets: 271 10*3/uL (ref 150–400)
RBC: 3.22 MIL/uL — ABNORMAL LOW (ref 3.87–5.11)
RDW: 15.9 % — ABNORMAL HIGH (ref 11.5–15.5)
WBC: 5.2 10*3/uL (ref 4.0–10.5)
nRBC: 0 % (ref 0.0–0.2)

## 2021-08-19 LAB — IRON AND TIBC
Iron: 66 ug/dL (ref 28–170)
Saturation Ratios: 21 % (ref 10.4–31.8)
TIBC: 322 ug/dL (ref 250–450)
UIBC: 256 ug/dL

## 2021-08-19 LAB — FERRITIN: Ferritin: 380 ng/mL — ABNORMAL HIGH (ref 11–307)

## 2021-08-19 NOTE — Progress Notes (Signed)
Pt says she is her usual. She sometimes gets constipation but she is working on drinking more water. She has back pain all the time and uses tylenol and voltaren gel.

## 2021-08-19 NOTE — Progress Notes (Signed)
Hematology/Oncology Consult Note Krist[facility] [phone] [phone]  Kristina Rowe Mar 15, 1930 [id number]  Chief Complaint:  routine f/u of iron deficiency anemia and colon cancer  History of present illness: Patient is a 87 year old female who initially presented to the ER with symptoms of significant abdominal pain.  CT abdomen and pelvis on 03/10/2021 showed volvulus along with a large hiatal hernia.  Patient initially had an exploratory laparotomy with lysis of adhesion by Dr. Hampton Abbot.  She then presented back to the ER with similar complaints and a repeat CT abdomen at that time showed bulky ascending colon mass compatible with carcinoma and possible local regional adenopathy.  No recurrent volvulus.  Patient underwent open right colectomy on 03/18/2021.  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  All regional lymph nodes 32 of them were negative for tumor but there was a tumor deposit present at 1 site.  PT3PN1C.   Risks versus benefits of adjuvant chemotherapy was discussed and patient was not deemed to be a candidate for adjuvant chemotherapy and she did not wish to go through chemotherapy herself.  Anemia work-up was consistent with iron deficiency and patient received 2 doses of Feraheme   Interval history: Patient returns to clinic for labs and follow up for history of iron deficiency anemia and continued surveillance of colon cancer. She received IV iron a few weeks ago. Energy has improved somewhat since that time. Has noticed dark stools over several months. No abdominal pain, changes in bowel movements. Has chronic back pain which is stable and unchanged. In the interim she saw Dr. Hampton Abbot with surgery.  CEA remains low. Imaging planned for later this year.    Review of Systems  Constitutional:  Positive for malaise/fatigue. Negative for chills, fever and weight loss.   HENT:  Negative for congestion, ear discharge and nosebleeds.   Eyes:  Negative for blurred vision.  Respiratory:  Negative for cough, hemoptysis, sputum production, shortness of breath and wheezing.   Cardiovascular:  Negative for chest pain, palpitations, orthopnea and claudication.  Gastrointestinal:  Positive for melena. Negative for abdominal pain, blood in stool, constipation, diarrhea, heartburn, nausea and vomiting.  Genitourinary:  Negative for dysuria, flank pain, frequency, hematuria and urgency.  Musculoskeletal:  Positive for back pain. Negative for joint pain and myalgias.  Skin:  Negative for rash.  Neurological:  Negative for dizziness, tingling, focal weakness, seizures, weakness and headaches.  Endo/Heme/Allergies:  Does not bruise/bleed easily.  Psychiatric/Behavioral:  Negative for depression and suicidal ideas. The patient does not have insomnia.    Allergies  Allergen Reactions   Metronidazole Other (See Comments)    Other reaction(s): Other Mouth sores Mouth sores Mouth sores    Omeprazole Other (See Comments)    Other reaction(s): Arthralgias (intolerance) Other reaction(s): Other    Bactrim [Sulfamethoxazole-Trimethoprim] Nausea Only   Codeine Nausea And Vomiting and Nausea Only    Other reaction(s): Nausea Only But tolerates tramadol     Past Medical History:  Diagnosis Date   Ductal carcinoma in situ (DCIS) of left breast    Dysphagia    GERD (gastroesophageal reflux disease)    Hiatal hernia    Hypertension    Iron deficiency anemia    Osteoporosis    Pulmonary embolism ([place])    Scoliosis    UTI (urinary tract infection)     Past Surgical History:  Procedure Laterality Date   BREAST LUMPECTOMY Left 2009   Ductal Carcinoma insitu  CATARACT EXTRACTION, BILATERAL     COLONOSCOPY N/A 03/17/2021   Procedure: COLONOSCOPY;  Surgeon: Lesly Rubenstein, MD;  Location: [facility];  Service: Endoscopy;  Laterality: N/A;   COLOSTOMY  REVISION Right 03/18/2021   Procedure: COLON RESECTION RIGHT;  Surgeon: Olean Ree, MD;  Location: [facility];  Service: General;  Laterality: Right;   LAPAROSCOPIC PARAESOPHAGEAL HERNIA REPAIR  2009   LAPAROTOMY N/A 03/10/2021   Procedure: EXPLORATORY LAPAROTOMY;  Surgeon: Olean Ree, MD;  Location: [facility];  Service: General;  Laterality: N/A;   TUBAL LIGATION      Social History   Socioeconomic History   Marital status: Married    Spouse name: Not on file   Number of children: Not on file   Years of education: Not on file   Highest education level: Not on file  Occupational History   Not on file  Tobacco Use   Smoking status: Never   Smokeless tobacco: Never  Substance and Sexual Activity   Alcohol use: Never   Drug use: Never   Sexual activity: Not Currently  Other Topics Concern   Not on file  Social History Narrative   Not on file   Social Determinants of Health   Financial Resource Strain: Not on file  Food Insecurity: Not on file  Transportation Needs: Not on file  Physical Activity: Not on file  Stress: Not on file  Social Connections: Not on file  Intimate Partner Violence: Not on file    Family History  Problem Relation Age of Onset   Breast cancer Neg Hx      Current Outpatient Medications:    acetaminophen (TYLENOL) 500 MG tablet, Take 500 mg by mouth at bedtime as needed for mild pain, fever or headache., Disp: , Rfl:    calcium carbonate (OS-CAL) 1250 (500 Ca) MG chewable tablet, Chew 1 tablet by mouth daily., Disp: , Rfl:    Cholecalciferol 50 MCG (2000 UT) CAPS, Take 1 capsule by mouth daily., Disp: , Rfl:    cyanocobalamin 1000 MCG tablet, Take 1,000 mcg by mouth daily., Disp: , Rfl:    diclofenac Sodium (VOLTAREN) 1 % GEL, Apply topically 4 (four) times daily., Disp: , Rfl:    econazole nitrate 1 % cream, Apply 1 application topically daily., Disp: , Rfl:    estradiol (ESTRACE) 0.1 MG/GM vaginal cream, Estrogen Cream Instruction Discard  applicator Apply pea sized amount to tip of finger to urethra before bed. Wash hands well after application. Use Monday, Wednesday and Friday, Disp: 14.9 g, Rfl: 3   folic acid (FOLVITE) 1 MG tablet, Take 1 mg by mouth daily., Disp: , Rfl:    loperamide (IMODIUM) 2 MG capsule, Take 2 mg by mouth as needed for diarrhea or loose stools., Disp: , Rfl:    mirtazapine (REMERON) 30 MG tablet, Take 30 mg by mouth at bedtime., Disp: , Rfl:    ondansetron (ZOFRAN ODT) 4 MG disintegrating tablet, Take 1 tablet (4 mg total) by mouth every 8 (eight) hours as needed for nausea or vomiting., Disp: 20 tablet, Rfl: 0   pantoprazole (PROTONIX) 40 MG tablet, Take 40 mg by mouth daily., Disp: , Rfl:    promethazine (PHENERGAN) 25 MG tablet, Take 25 mg by mouth every 6 (six) hours as needed for nausea or vomiting., Disp: , Rfl:    SUMAtriptan (IMITREX) 50 MG tablet, Take 50 mg by mouth as directed. Take 1 at onset of headaTake 1 at onset of headache. May repeat once in two hours.  Do not exceed 2 per day or 4 per week., Disp: , Rfl:    tolterodine (DETROL LA) 4 MG 24 hr capsule, Take 1 capsule (4 mg total) by mouth daily., Disp: 30 capsule, Rfl: 11   warfarin (COUMADIN) 2.5 MG tablet, On Sundays and Thursdays resume the 2.5 mg dose.  All other days take 5 mg. (Patient taking differently: Take 2.5-5 mg by mouth daily at 12 noon. On Sundays, Tuesday, and Thursdays resume the 2.5 mg dose.  All other days take 5 mg.), Disp: 30 tablet, Rfl: 11   warfarin (COUMADIN) 5 MG tablet, Take by mouth., Disp: , Rfl:   ECHOCARDIOGRAM COMPLETE  Result Date: 08/18/2021    ECHOCARDIOGRAM REPORT   Patient Name:   Marlo C Spangler Date of Exam: 08/18/2021 Medical Rec #:  [id number]      Height:       61.0 in Accession #:    [id number]     Weight:       88.0 lb Date of Birth:  01/02/1930     BSA:          1.332 m Patient Age:    89 years       BP:           132/74 mmHg Patient Gender: F              HR:           76  bpm. Exam Location:  [place]  Procedure: 2D Echo, Cardiac Doppler and Color Doppler Indications:    R01.1 Murmur; R06.02 SOB  History:        Patient has no prior history of Echocardiogram examinations.                 Arrythmias:Tachycardia, Signs/Symptoms:Shortness of Breath and                 Murmur; Risk Factors:Hypertension, Non-Smoker and h/o pulmonary                 embolism.  Sonographer:    Pilar Jarvis RDMS, RVT, RDCS Referring Phys: [id number] [facility]  Sonographer Comments: Markedly limited acoustic windows IMPRESSIONS  1. Left ventricular ejection fraction, by estimation, is 55 to 60%. The left ventricle has normal function. The left ventricle has no regional wall motion abnormalities. There is mild left ventricular hypertrophy. Left ventricular diastolic parameters are consistent with Grade II diastolic dysfunction (pseudonormalization).  2. Right ventricular systolic function is normal. The right ventricular size is normal.  3. Left atrial size was moderately dilated.  4. The mitral valve is degenerative. Mild mitral valve regurgitation.  5. Aortic valve DVI=0.35, mean gradient 28mmHg. The aortic valve is calcified. Aortic valve regurgitation is not visualized. Moderate aortic valve stenosis. FINDINGS  Left Ventricle: Left ventricular ejection fraction, by estimation, is 55 to 60%. The left ventricle has normal function. The left ventricle has no regional wall motion abnormalities. The left ventricular internal cavity size was normal in size. There is  mild left ventricular hypertrophy. Left ventricular diastolic parameters are consistent with Grade II diastolic dysfunction (pseudonormalization). Right Ventricle: The right ventricular size is normal. No increase in right ventricular wall thickness. Right ventricular systolic function is normal. Left Atrium: Left atrial size was moderately dilated. Right Atrium: Right atrial size was normal in size. Pericardium: There is no evidence of pericardial effusion. Mitral Valve: The  mitral valve is degenerative in appearance. Mild to moderate mitral annular calcification. Mild mitral valve regurgitation. Tricuspid Valve: The tricuspid valve  is normal in structure. Tricuspid valve regurgitation is mild. Aortic Valve: Aortic valve DVI=0.35, mean gradient 54mmHg. The aortic valve is calcified. Aortic valve regurgitation is not visualized. Moderate aortic stenosis is present. Aortic valve mean gradient measures 10.6 mmHg. Aortic valve peak gradient measures 22.1 mmHg. Aortic valve area, by VTI measures 1.00 cm. Pulmonic Valve: The pulmonic valve was not well visualized. Pulmonic valve regurgitation is not visualized. Aorta: The aortic root is normal in size and structure. IAS/Shunts: No atrial level shunt detected by color flow Doppler.  LEFT VENTRICLE PLAX 2D LVOT diam:     1.80 cm     Diastology LV SV:         53          LV e' medial:    3.92 cm/s LV SV Index:   40          LV E/e' medial:  18.0 LVOT Area:     2.54 cm    LV e' lateral:   5.77 cm/s                            LV E/e' lateral: 12.2  LV Volumes (MOD) LV vol d, MOD A2C: 70.5 ml LV vol d, MOD A4C: 65.7 ml LV vol s, MOD A2C: 32.1 ml LV vol s, MOD A4C: 29.2 ml LV SV MOD A2C:     38.4 ml LV SV MOD A4C:     65.7 ml LV SV MOD BP:      39.4 ml RIGHT VENTRICLE RV Basal diam:  3.20 cm RV S prime:     8.27 cm/s TAPSE (M-mode): 1.6 cm LEFT ATRIUM             Index LA diam:        4.00 cm 3.00 cm/m LA Vol (A2C):   57.6 ml 43.23 ml/m LA Vol (A4C):   60.3 ml 45.26 ml/m LA Biplane Vol: 60.5 ml 45.41 ml/m  AORTIC VALVE                    PULMONIC VALVE AV Area (Vmax):    0.89 cm     PV Vmax:       1.08 m/s AV Area (Vmean):   0.93 cm     PV Peak grad:  4.7 mmHg AV Area (VTI):     1.00 cm AV Vmax:           234.80 cm/s AV Vmean:          144.180 cm/s AV VTI:            0.532 m AV Peak Grad:      22.1 mmHg AV Mean Grad:      10.6 mmHg LVOT Vmax:         82.40 cm/s LVOT Vmean:        52.500 cm/s LVOT VTI:          0.208 m LVOT/AV VTI ratio:  0.39  AORTA Ao Root diam: 2.60 cm Ao Arch diam: 2.8 cm MITRAL VALVE                TRICUSPID VALVE MV Area (PHT): 1.91 cm     TR Peak grad:   31.4 mmHg MV Decel Time: 397 msec     TR Vmax:        280.00 cm/s MV E velocity: 70.60 cm/s MV A velocity: 105.00 cm/s  SHUNTS MV E/A ratio:  0.67         Systemic VTI:  0.21 m                             Systemic Diam: 1.80 cm Kate Sable MD Electronically signed by Kate Sable MD Signature Date/Time: 08/18/2021/5:31:50 PM    Final     No images are attached to the encounter.   CMP Latest Ref Rng & Units 04/23/2021  Glucose 70 - 99 mg/dL 113(H)  BUN 8 - 23 mg/dL 19  Creatinine 0.44 - 1.00 mg/dL 0.80  Sodium 135 - 145 mmol/L 136  Potassium 3.5 - 5.1 mmol/L 4.5  Chloride 98 - 111 mmol/L 102  CO2 22 - 32 mmol/L 27  Calcium 8.9 - 10.3 mg/dL 9.4  Total Protein 6.5 - 8.1 g/dL 6.8  Total Bilirubin 0.3 - 1.2 mg/dL 0.7  Alkaline Phos 38 - 126 U/L 76  AST 15 - 41 U/L 15  ALT 0 - 44 U/L 11   CBC Latest Ref Rng & Units 08/19/2021  WBC 4.0 - 10.5 K/uL 5.2  Hemoglobin 12.0 - 15.0 g/dL 10.5(L)  Hematocrit 36.0 - 46.0 % 33.9(L)  Platelets 150 - 400 K/uL 271     Observation/objective:  There were no vitals filed for this visit. There is no height or weight on file to calculate BMI. Physical Exam Vitals reviewed.  Constitutional:      Appearance: She is not ill-appearing.     Comments: Thin build  HENT:     Head: Normocephalic.  Eyes:     General: No scleral icterus.    Conjunctiva/sclera: Conjunctivae normal.  Cardiovascular:     Rate and Rhythm: Normal rate and regular rhythm.     Pulses: Normal pulses.  Pulmonary:     Effort: Pulmonary effort is normal. No respiratory distress.     Breath sounds: Normal breath sounds.  Abdominal:     General: There is no distension.     Tenderness: There is no abdominal tenderness. There is no guarding.  Musculoskeletal:        General: Deformity (kyphosis) present. No swelling.     Cervical  back: Neck supple.  Lymphadenopathy:     Cervical: No cervical adenopathy.  Skin:    General: Skin is warm and dry.     Coloration: Skin is not pale.     Findings: No rash.  Neurological:     Mental Status: She is alert and oriented to person, place, and time.  Psychiatric:        Behavior: Behavior normal.        Thought Content: Thought content normal.        Judgment: Judgment normal.     CBC EXTENDED Latest Ref Rng & Units 08/19/2021 07/27/2021 05/14/2021  WBC 4.0 - 10.5 K/uL 5.2 6.2 6.2  RBC 3.87 - 5.11 MIL/uL 3.22(L) 2.87(L) 3.37(L)  HGB 12.0 - 15.0 g/dL 10.5(L) 9.5(L) 10.4(L)  HCT 36.0 - 46.0 % 33.9(L) 29.2(L) 32.7(L)  PLT 150 - 400 K/uL 271 311 393  NEUTROABS 1.7 - 7.7 K/uL 4.0 4.9 4.4  LYMPHSABS 0.7 - 4.0 K/uL 0.9 0.8 1.1   CMP Latest Ref Rng & Units 04/23/2021 04/01/2021 03/23/2021  Glucose 70 - 99 mg/dL 113(H) 117(H) 88  BUN 8 - 23 mg/dL 19 13 6(L)  Creatinine 0.44 - 1.00 mg/dL 0.80 0.75 0.65  Sodium 135 - 145 mmol/L 136 138 136  Potassium 3.5 - 5.1 mmol/L 4.5 4.0  3.7  Chloride 98 - 111 mmol/L 102 101 102  CO2 22 - 32 mmol/L 27 28 27   Calcium 8.9 - 10.3 mg/dL 9.4 9.0 8.6(L)  Total Protein 6.5 - 8.1 g/dL 6.8 6.7 -  Total Bilirubin 0.3 - 1.2 mg/dL 0.7 0.5 -  Alkaline Phos 38 - 126 U/L 76 78 -  AST 15 - 41 U/L 15 20 -  ALT 0 - 44 U/L 11 14 -   Iron/TIBC/Ferritin/ %Sat    Component Value Date/Time   IRON 29 07/27/2021 1448   TIBC 389 07/27/2021 1448   FERRITIN 12 07/27/2021 1448   IRONPCTSAT 8 (L) 07/27/2021 1448     Assessment and plan: Patient is a 87 year old female with history of iron deficiency anemia and stage III colon cancer and this is a routine follow-up visit  Stage III colon cancer: s/p open right colectomy. CEA low. Clinically, dark stools and iron deficiency. She has imaging planned with Dr. Hampton Abbot in December. See below.  Iron deficiency anemia-  Iron studies from 07/27/21 reviewed: ferritin 12, iron saturation 8%. Consistent with iron deficiency.  She received IV Feraheme x 2. Hemoglobin improved to 10.5. Iron studies today are pending. Symptoms concerning for GI bleeding. Will refer back to Dr. Haig Prophet at [facility] for evaluation.  Esophageal strictures- managed by Dr. Truddie Crumble at [facility]. Previous imaging showed large hiatal hernia.   Refer back to GI for colonoscopy RTC in 12 weeks for labs then day later see Dr. Janese Banks and possible iron  I discussed the assessment and treatment plan with the patient. The patient was provided an opportunity to ask questions and all were answered. The patient agreed with the plan and demonstrated an understanding of the instructions.   The patient was advised to call back or seek an in-person evaluation if the symptoms worsen or if the condition fails to improve as anticipated.  Visit Diagnosis: 1. Iron deficiency anemia, unspecified iron deficiency anemia type   2. Encounter for follow-up surveillance of colon cancer    Beckey Rutter, DNP, AGNP-C [place] [facility] [phone] (clinic) 08/19/2021 1:26 PM  CC: Dr. Hampton Abbot & Dr. Haig Prophet

## 2021-08-23 ENCOUNTER — Encounter: Payer: Self-pay | Admitting: Cardiology

## 2021-08-23 ENCOUNTER — Other Ambulatory Visit: Payer: Self-pay

## 2021-08-23 ENCOUNTER — Ambulatory Visit (INDEPENDENT_AMBULATORY_CARE_PROVIDER_SITE_OTHER): Payer: Medicare Other | Admitting: Cardiology

## 2021-08-23 VITALS — BP 110/64 | HR 60 | Ht 61.0 in | Wt 89.0 lb

## 2021-08-23 DIAGNOSIS — Z86711 Personal history of pulmonary embolism: Secondary | ICD-10-CM | POA: Diagnosis not present

## 2021-08-23 DIAGNOSIS — I350 Nonrheumatic aortic (valve) stenosis: Secondary | ICD-10-CM | POA: Diagnosis not present

## 2021-08-23 NOTE — Progress Notes (Signed)
Cardiology Office Note:    Date:  08/23/2021   ID:  Kristina Rowe, DOB 01-04-1930, MRN [id number]  PCP:  Haydee Salter, MD   [facility] Cardiologist:  Kate Sable, MD     Referring MD: Haydee Salter, MD   Chief Complaint  Patient presents with   Other    Follow up post ECHO -- Meds reviewed verbally with patient.      History of Present Illness:    Kristina Rowe is a 87 y.o. female with a hx of PE on Coumadin, hiatal hernia, colon cancer s/p resection who presents for follow-up.  Previously seen due to a systolic murmur.   Echocardiogram was ordered to evaluate any significant valvular pathology.  Denies chest pain or shortness of breath.  Denies dizziness, presyncope or syncope.  She feels well, has no concerns at this time.  Prior notes  02/2021 patient had abdominal surgery due to volvulus, further work-up revealed colon cancer, underwent resection with clean margins 02/2021.  She is a retired Therapist, sports.   Has a history of blood clots, family history of blood clots.  Was told after TEE 20+ years ago, she will be on Coumadin indefinitely.  Follows up at [facility] for INR checks.  Past Medical History:  Diagnosis Date   Ductal carcinoma in situ (DCIS) of left breast    Dysphagia    GERD (gastroesophageal reflux disease)    Hiatal hernia    Hypertension    Iron deficiency anemia    Osteoporosis    Pulmonary embolism ([place])    Scoliosis    UTI (urinary tract infection)     Past Surgical History:  Procedure Laterality Date   BREAST LUMPECTOMY Left 2009   Ductal Carcinoma insitu    CATARACT EXTRACTION, BILATERAL     COLONOSCOPY N/A 03/17/2021   Procedure: COLONOSCOPY;  Surgeon: Lesly Rubenstein, MD;  Location: [facility] ENDOSCOPY;  Service: Endoscopy;  Laterality: N/A;   COLOSTOMY REVISION Right 03/18/2021   Procedure: COLON RESECTION RIGHT;  Surgeon: Olean Ree, MD;  Location: [facility] ORS;  Service: General;  Laterality: Right;   LAPAROSCOPIC  PARAESOPHAGEAL HERNIA REPAIR  2009   LAPAROTOMY N/A 03/10/2021   Procedure: EXPLORATORY LAPAROTOMY;  Surgeon: Olean Ree, MD;  Location: [facility] ORS;  Service: General;  Laterality: N/A;   TUBAL LIGATION      Current Medications: Current Meds  Medication Sig   acetaminophen (TYLENOL) 500 MG tablet Take 500 mg by mouth at bedtime as needed for mild pain, fever or headache.   calcium carbonate (OS-CAL) 1250 (500 Ca) MG chewable tablet Chew 1 tablet by mouth daily.   Cholecalciferol 50 MCG (2000 UT) CAPS Take 1 capsule by mouth daily.   cyanocobalamin 1000 MCG tablet Take 1,000 mcg by mouth daily.   diclofenac Sodium (VOLTAREN) 1 % GEL Apply topically 4 (four) times daily.   econazole nitrate 1 % cream Apply 1 application topically daily.   estradiol (ESTRACE) 0.1 MG/GM vaginal cream Estrogen Cream Instruction Discard applicator Apply pea sized amount to tip of finger to urethra before bed. Wash hands well after application. Use Monday, Wednesday and Friday   folic acid (FOLVITE) 1 MG tablet Take 1 mg by mouth daily.   loperamide (IMODIUM) 2 MG capsule Take 2 mg by mouth as needed for diarrhea or loose stools.   mirtazapine (REMERON) 30 MG tablet Take 15 mg by mouth at bedtime.   ondansetron (ZOFRAN ODT) 4 MG disintegrating tablet Take 1 tablet (4 mg total) by mouth  every 8 (eight) hours as needed for nausea or vomiting.   pantoprazole (PROTONIX) 40 MG tablet Take 40 mg by mouth daily.   promethazine (PHENERGAN) 25 MG tablet Take 25 mg by mouth every 6 (six) hours as needed for nausea or vomiting.   warfarin (COUMADIN) 2.5 MG tablet On Sundays and Thursdays resume the 2.5 mg dose.  All other days take 5 mg. (Patient taking differently: Take 2.5-5 mg by mouth daily at 12 noon. 2.5 mg sun Thursday, all other days is 5mg )   warfarin (COUMADIN) 5 MG tablet Take by mouth.     Allergies:   Metronidazole, Omeprazole, Bactrim [sulfamethoxazole-trimethoprim], and Codeine   Social History    Socioeconomic History   Marital status: Married    Spouse name: Not on file   Number of children: Not on file   Years of education: Not on file   Highest education level: Not on file  Occupational History   Not on file  Tobacco Use   Smoking status: Never   Smokeless tobacco: Never  Vaping Use   Vaping Use: Never used  Substance and Sexual Activity   Alcohol use: Never   Drug use: Never   Sexual activity: Not Currently  Other Topics Concern   Not on file  Social History Narrative   Not on file   Social Determinants of Health   Financial Resource Strain: Not on file  Food Insecurity: Not on file  Transportation Needs: Not on file  Physical Activity: Not on file  Stress: Not on file  Social Connections: Not on file     Family History: The patient's family history is negative for Breast cancer.  ROS:   Please see the history of present illness.     All other systems reviewed and are negative.  EKGs/Labs/Other Studies Reviewed:    The following studies were reviewed today:   EKG:  EKG not ordered today.    Recent Labs: 03/11/2021: Magnesium 1.7 04/01/2021: TSH 0.454 04/23/2021: ALT 11; BUN 19; Creatinine, Ser 0.80; Potassium 4.5; Sodium 136 08/19/2021: Hemoglobin 10.5; Platelets 271  Recent Lipid Panel No results found for: CHOL, TRIG, HDL, CHOLHDL, VLDL, LDLCALC, LDLDIRECT   Risk Assessment/Calculations:          Physical Exam:    VS:  BP 110/64 (BP Location: Left Arm, Patient Position: Sitting, Cuff Size: Normal)   Pulse 60   Ht 5\' 1"  (1.549 m)   Wt 89 lb (40.4 kg)   SpO2 95%   BMI 16.82 kg/m     Wt Readings from Last 3 Encounters:  08/23/21 89 lb (40.4 kg)  08/19/21 89 lb 14.4 oz (40.8 kg)  07/14/21 88 lb (39.9 kg)     GEN: Appears frail, no acute distress HEENT: Normal NECK: No JVD; No carotid bruits LYMPHATICS: No lymphadenopathy CARDIAC: RRR, 2/6 systolic murmur RESPIRATORY:  Clear to auscultation without rales, wheezing or rhonchi   ABDOMEN: Soft, non-tender, non-distended MUSCULOSKELETAL:  No edema; No deformity  SKIN: Warm and dry NEUROLOGIC:  Alert and oriented x 3 PSYCHIATRIC:  Normal affect   ASSESSMENT:    1. Aortic valve stenosis, etiology of cardiac valve disease unspecified   2. Hx of pulmonary embolus     PLAN:    In order of problems listed above:  Systolic murmur, echo 03/1024 showed normal systolic function, EF 55 to 60%, moderate aortic valve stenosis.  Plan to monitor aortic valve with serial echocardiograms.  Repeat echo August 2023, follow-up with myself after. History of PE, family  history of blood clots, on Coumadin.  Follow-up in 10-12 months after repeat echo.    Medication Adjustments/Labs and Tests Ordered: Current medicines are reviewed at length with the patient today.  Concerns regarding medicines are outlined above.  Orders Placed This Encounter  Procedures   ECHOCARDIOGRAM COMPLETE     No orders of the defined types were placed in this encounter.   Patient Instructions  Medication Instructions:  Your physician recommends that you continue on your current medications as directed. Please refer to the Current Medication list given to you today.  *If you need a refill on your cardiac medications before your next appointment, please call your pharmacy*   Lab Work: None ordered If you have labs (blood work) drawn today and your tests are completely normal, you will receive your results only by: [place] (if you have MyChart) OR A paper copy in the mail If you have any lab test that is abnormal or we need to change your treatment, we will call you to review the results.   Testing/Procedures:  Your physician has requested that you have an echocardiogram in 11 months. Echocardiography is a painless test that uses sound waves to create images of your heart. It provides your doctor with information about the size and shape of your heart and how well your heart's  chambers and valves are working. This procedure takes approximately one hour. There are no restrictions for this procedure.    Follow-Up: At [facility], you and your health needs are our priority.  As part of our continuing mission to provide you with exceptional heart care, we have created designated Provider Care Teams.  These Care Teams include your primary Cardiologist (physician) and Advanced Practice Providers (APPs -  Physician Assistants and Nurse Practitioners) who all work together to provide you with the care you need, when you need it.  We recommend signing up for the patient portal called "MyChart".  Sign up information is provided on this After Visit Summary.  MyChart is used to connect with patients for Virtual Visits (Telemedicine).  Patients are able to view lab/test results, encounter notes, upcoming appointments, etc.  Non-urgent messages can be sent to your provider as well.   To learn more about what you can do with MyChart, go to [email or web address].    Your next appointment:   1 year(s)  The format for your next appointment:   In Person  Provider:   Kate Sable, MD   Other Instructions    Signed, Kate Sable, MD  08/23/2021 5:09 PM    [place]

## 2021-08-23 NOTE — Patient Instructions (Signed)
Medication Instructions:  Your physician recommends that you continue on your current medications as directed. Please refer to the Current Medication list given to you today.  *If you need a refill on your cardiac medications before your next appointment, please call your pharmacy*   Lab Work: None ordered If you have labs (blood work) drawn today and your tests are completely normal, you will receive your results only by: [place] (if you have MyChart) OR A paper copy in the mail If you have any lab test that is abnormal or we need to change your treatment, we will call you to review the results.   Testing/Procedures:  Your physician has requested that you have an echocardiogram in 11 months. Echocardiography is a painless test that uses sound waves to create images of your heart. It provides your doctor with information about the size and shape of your heart and how well your heart's chambers and valves are working. This procedure takes approximately one hour. There are no restrictions for this procedure.    Follow-Up: At [facility], you and your health needs are our priority.  As part of our continuing mission to provide you with exceptional heart care, we have created designated Provider Care Teams.  These Care Teams include your primary Cardiologist (physician) and Advanced Practice Providers (APPs -  Physician Assistants and Nurse Practitioners) who all work together to provide you with the care you need, when you need it.  We recommend signing up for the patient portal called "MyChart".  Sign up information is provided on this After Visit Summary.  MyChart is used to connect with patients for Virtual Visits (Telemedicine).  Patients are able to view lab/test results, encounter notes, upcoming appointments, etc.  Non-urgent messages can be sent to your provider as well.   To learn more about what you can do with MyChart, go to [email or web address].    Your next  appointment:   1 year(s)  The format for your next appointment:   In Person  Provider:   Kate Sable, MD   Other Instructions

## 2021-08-24 ENCOUNTER — Telehealth: Payer: Self-pay | Admitting: *Deleted

## 2021-08-24 NOTE — Telephone Encounter (Signed)
Patient called stating that she has not heard from GI yet  Follow-up and Disposition History  08/19/2021 1413 - Luella Cook, RN  Check-out note: Judeen Hammans to call GI Dr. Haig Prophet for w/u  12 weeks for labs day 1 and day 2 see Janese Banks and possible iron treatment at that time.  syv

## 2021-08-25 ENCOUNTER — Encounter: Payer: Self-pay | Admitting: *Deleted

## 2021-08-26 ENCOUNTER — Telehealth: Payer: Self-pay | Admitting: *Deleted

## 2021-08-26 ENCOUNTER — Other Ambulatory Visit: Payer: Self-pay

## 2021-08-26 ENCOUNTER — Emergency Department: Payer: Medicare Other

## 2021-08-26 ENCOUNTER — Emergency Department
Admission: EM | Admit: 2021-08-26 | Discharge: 2021-08-26 | Disposition: A | Discharge status: Home / Self Care | Payer: Medicare Other | Attending: Emergency Medicine | Admitting: Emergency Medicine

## 2021-08-26 DIAGNOSIS — N1831 Chronic kidney disease, stage 3a: Secondary | ICD-10-CM | POA: Insufficient documentation

## 2021-08-26 DIAGNOSIS — R110 Nausea: Secondary | ICD-10-CM | POA: Insufficient documentation

## 2021-08-26 DIAGNOSIS — Z7901 Long term (current) use of anticoagulants: Secondary | ICD-10-CM | POA: Diagnosis not present

## 2021-08-26 DIAGNOSIS — D649 Anemia, unspecified: Secondary | ICD-10-CM | POA: Diagnosis not present

## 2021-08-26 DIAGNOSIS — Z85038 Personal history of other malignant neoplasm of large intestine: Secondary | ICD-10-CM | POA: Insufficient documentation

## 2021-08-26 DIAGNOSIS — Z79899 Other long term (current) drug therapy: Secondary | ICD-10-CM | POA: Insufficient documentation

## 2021-08-26 DIAGNOSIS — I129 Hypertensive chronic kidney disease with stage 1 through stage 4 chronic kidney disease, or unspecified chronic kidney disease: Secondary | ICD-10-CM | POA: Diagnosis not present

## 2021-08-26 DIAGNOSIS — Z85828 Personal history of other malignant neoplasm of skin: Secondary | ICD-10-CM | POA: Diagnosis not present

## 2021-08-26 DIAGNOSIS — Z853 Personal history of malignant neoplasm of breast: Secondary | ICD-10-CM | POA: Diagnosis not present

## 2021-08-26 DIAGNOSIS — K219 Gastro-esophageal reflux disease without esophagitis: Secondary | ICD-10-CM | POA: Insufficient documentation

## 2021-08-26 DIAGNOSIS — R1013 Epigastric pain: Secondary | ICD-10-CM | POA: Diagnosis present

## 2021-08-26 LAB — COMPREHENSIVE METABOLIC PANEL
ALT: 16 U/L (ref 0–44)
AST: 23 U/L (ref 15–41)
Albumin: 4.4 g/dL (ref 3.5–5.0)
Alkaline Phosphatase: 66 U/L (ref 38–126)
Anion gap: 9 (ref 5–15)
BUN: 18 mg/dL (ref 8–23)
CO2: 29 mmol/L (ref 22–32)
Calcium: 9.6 mg/dL (ref 8.9–10.3)
Chloride: 101 mmol/L (ref 98–111)
Creatinine, Ser: 0.88 mg/dL (ref 0.44–1.00)
GFR, Estimated: >60 mL/min (ref >60)
Glucose, Bld: 120 mg/dL — ABNORMAL HIGH (ref 70–99)
Potassium: 4.2 mmol/L (ref 3.5–5.1)
Sodium: 139 mmol/L (ref 135–145)
Total Bilirubin: 0.6 mg/dL (ref 0.3–1.2)
Total Protein: 7.2 g/dL (ref 6.5–8.1)

## 2021-08-26 LAB — CBC WITH DIFFERENTIAL/PLATELET
Abs Immature Granulocytes: 0.01 10*3/uL (ref 0.00–0.07)
Basophils Absolute: 0 10*3/uL (ref 0.0–0.1)
Basophils Relative: 1 %
Eosinophils Absolute: 0 10*3/uL (ref 0.0–0.5)
Eosinophils Relative: 0 %
HCT: 32 % — ABNORMAL LOW (ref 36.0–46.0)
Hemoglobin: 10.3 g/dL — ABNORMAL LOW (ref 12.0–15.0)
Immature Granulocytes: 0 %
Lymphocytes Relative: 18 %
Lymphs Abs: 0.9 10*3/uL (ref 0.7–4.0)
MCH: 34 pg (ref 26.0–34.0)
MCHC: 32.2 g/dL (ref 30.0–36.0)
MCV: 105.6 fL — ABNORMAL HIGH (ref 80.0–100.0)
Monocytes Absolute: 0.3 10*3/uL (ref 0.1–1.0)
Monocytes Relative: 5 %
Neutro Abs: 4 10*3/uL (ref 1.7–7.7)
Neutrophils Relative %: 76 %
Platelets: 245 10*3/uL (ref 150–400)
RBC: 3.03 MIL/uL — ABNORMAL LOW (ref 3.87–5.11)
RDW: 15.9 % — ABNORMAL HIGH (ref 11.5–15.5)
WBC: 5.3 10*3/uL (ref 4.0–10.5)
nRBC: 0 % (ref 0.0–0.2)

## 2021-08-26 LAB — URINALYSIS, ROUTINE W REFLEX MICROSCOPIC
Bilirubin Urine: NEGATIVE
Glucose, UA: NEGATIVE mg/dL
Hgb urine dipstick: NEGATIVE
Ketones, ur: NEGATIVE mg/dL
Leukocytes,Ua: NEGATIVE
Nitrite: NEGATIVE
Protein, ur: NEGATIVE mg/dL
Specific Gravity, Urine: 1.01 (ref 1.005–1.030)
pH: 5.5 (ref 5.0–8.0)

## 2021-08-26 LAB — TROPONIN I (HIGH SENSITIVITY): Troponin I (High Sensitivity): 7 ng/L (ref <18)

## 2021-08-26 LAB — LIPASE, BLOOD: Lipase: 36 U/L (ref 11–51)

## 2021-08-26 MED ORDER — FENTANYL CITRATE PF 50 MCG/ML IJ SOSY: 25.0000 ug | PREFILLED_SYRINGE | Freq: Once | INTRAMUSCULAR | Status: AC

## 2021-08-26 MED ORDER — METOCLOPRAMIDE HCL 5 MG/ML IJ SOLN
10.0000 mg | Freq: Once | INTRAMUSCULAR | Status: AC
  Administered 2021-08-26: 10 mg via INTRAVENOUS
  Filled 2021-08-26: qty 2

## 2021-08-26 MED ORDER — IOHEXOL 350 MG/ML SOLN
75.0000 mL | Freq: Once | INTRAVENOUS | Status: AC | PRN
  Administered 2021-08-26: 75 mL via INTRAVENOUS

## 2021-08-26 MED ORDER — PANTOPRAZOLE SODIUM 40 MG PO TBEC
40.0000 mg | DELAYED_RELEASE_TABLET | Freq: Once | ORAL | Status: AC
  Administered 2021-08-26: 40 mg via ORAL
  Filled 2021-08-26: qty 1

## 2021-08-26 MED ORDER — FENTANYL CITRATE PF 50 MCG/ML IJ SOSY
PREFILLED_SYRINGE | INTRAMUSCULAR | Status: AC
  Administered 2021-08-26: 25 ug via INTRAVENOUS
  Filled 2021-08-26: qty 1

## 2021-08-26 MED ORDER — SUCRALFATE 1 G PO TABS: 1.0000 g | ORAL_TABLET | Freq: Three times a day (TID) | ORAL | 0 refills | Status: DC

## 2021-08-26 MED ORDER — ONDANSETRON HCL 4 MG/2ML IJ SOLN
4.0000 mg | Freq: Once | INTRAMUSCULAR | Status: AC
  Administered 2021-08-26: 4 mg via INTRAVENOUS
  Filled 2021-08-26: qty 2

## 2021-08-26 MED ORDER — SUCRALFATE 1 G PO TABS
1.0000 g | ORAL_TABLET | Freq: Once | ORAL | Status: AC
  Administered 2021-08-26: 1 g via ORAL
  Filled 2021-08-26: qty 1

## 2021-08-26 MED ORDER — ALUM & MAG HYDROXIDE-SIMETH 200-200-20 MG/5ML PO SUSP
30.0000 mL | Freq: Once | ORAL | Status: AC
  Administered 2021-08-26: 30 mL via ORAL
  Filled 2021-08-26: qty 30

## 2021-08-26 NOTE — Telephone Encounter (Signed)
Patient just called reporting that she has had abdominal pain all day and it is getting worse. She states she just does not feel good and is asking if she can be seen. Please advise

## 2021-08-26 NOTE — ED Triage Notes (Signed)
Pt c/o upper abd pain today, states she has been sick on her stomach all week and diarrhea yesterday, states she has a hx of bowel obstruction

## 2021-08-26 NOTE — ED Triage Notes (Signed)
First Nurse NOte:  C/O abdomianl pain x 5 days.  Has history of same -- bowel obstruction.  C/O nausea.  Denies vomiting.

## 2021-08-26 NOTE — ED Provider Notes (Signed)
[facility] Emergency Department Provider Note  ____________________________________________   Event Date/Time   First MD Initiated Contact with Patient 08/26/21 2022     (approximate)  I have reviewed the triage vital signs and the nursing notes.   HISTORY  Chief Complaint Abdominal Pain   HPI Kristina Rowe is a 87 y.o. female with a past medical history of HTN, PE on Coumadin, chronic anemia, breast cancer status postlumpectomy, and colon cancer as well as a history of colonic volvulus status post colectomy who presents accompanied by daughter for assessment of some epigastric abdominal pain that started earlier today after lunch.  She denies any vomiting but endorses some nausea.  No chest pain, cough, shortness of breath, back pain, lower abdominal pain, urinary symptoms, headache, earache, sore throat, rash or recent injuries or falls.  Patient states she has had some diarrhea lately.  She has not taken any laxatives and is not on any opioid medications.  She does not take NSAIDs at this time and diagnosed with an ulcer before.  No clear alleviating aggravating factors.  No prior similar episodes.         Past Medical History:  Diagnosis Date   Ductal carcinoma in situ (DCIS) of left breast    Dysphagia    GERD (gastroesophageal reflux disease)    Hiatal hernia    Hypertension    Iron deficiency anemia    Osteoporosis    Pulmonary embolism (HCC)    Scoliosis    UTI (urinary tract infection)     Patient Active Problem List   Diagnosis Date Noted   Basal cell carcinoma of leg 04/01/2021   Coronary atherosclerosis 04/01/2021   History of pulmonary embolism 04/01/2021   Pulmonary nodule 04/01/2021   Malignant neoplasm of colon ([place]) 03/29/2021   Rectal bleeding 03/15/2021   Hypertension    GERD (gastroesophageal reflux disease)    Colonic mass    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[place]) 03/10/2021   Anemia, unspecified 03/06/2021   Bronchiectasis  without complication ([place]) 26/37/8588   Iron deficiency anemia 03/27/2020   Moderate aortic stenosis 03/27/2020   Stage 3a chronic kidney disease ([place]) 07/04/2019   Hypnic headache 05/28/2019   Telogen effluvium 04/17/2019   Xerosis cutis 04/17/2019   Essential hypertension 01/14/2019   Cervical radiculopathy 05/04/2018   Osteoarthritis of left glenohumeral joint 05/04/2018   Vitamin B12 deficiency 01/01/2018   Vascular abnormality 09/11/2017   Dyspepsia 03/28/2017   Pulmonary embolism ([place]) 08/16/2016   Dilated pore of [place] 03/23/2015   Retinal drusen of both eyes 08/28/2014   Status post right cataract extraction 07/30/2014   [place] 03/19/2014   Chronic back pain 11/06/2013   Postmenopausal osteoporosis 06/11/2013   Anticoagulated on Coumadin 04/06/2013   Long term (current) use of anticoagulants 10/18/2012   Preglaucoma 05/10/2012   Presbyopia 05/10/2012   Senile nuclear sclerosis 05/10/2012   Lobular carcinoma in situ of breast 04/13/2012   Closed fracture of ankle 02/27/2012   History of nonmelanoma skin cancer 09/05/2011   Other benign neoplasm of skin of trunk 09/05/2011   Osteoporosis 08/25/2011   Transient alteration of awareness 09/03/2009   Colon adenoma 07/30/2002    Past Surgical History:  Procedure Laterality Date   BREAST LUMPECTOMY Left 2009   Ductal Carcinoma insitu    CATARACT EXTRACTION, BILATERAL     COLONOSCOPY N/A 03/17/2021   Procedure: COLONOSCOPY;  Surgeon: Lesly Rubenstein, MD;  Location: [facility] ENDOSCOPY;  Service: Endoscopy;  Laterality: N/A;   COLOSTOMY REVISION  Right 03/18/2021   Procedure: COLON RESECTION RIGHT;  Surgeon: Olean Ree, MD;  Location: [facility] ORS;  Service: General;  Laterality: Right;   LAPAROSCOPIC PARAESOPHAGEAL HERNIA REPAIR  2009   LAPAROTOMY N/A 03/10/2021   Procedure: EXPLORATORY LAPAROTOMY;  Surgeon: Olean Ree, MD;  Location: [facility] ORS;  Service: General;  Laterality: N/A;   TUBAL LIGATION      Prior to Admission  medications   Medication Sig Start Date End Date Taking? Authorizing Provider  sucralfate (CARAFATE) 1 g tablet Take 1 tablet (1 g total) by mouth 4 (four) times daily -  with meals and at bedtime for 3 days. 08/26/21 08/29/21 Yes Lucrezia Starch, MD  acetaminophen (TYLENOL) 500 MG tablet Take 500 mg by mouth at bedtime as needed for mild pain, fever or headache.    [provider]  calcium carbonate (OS-CAL) 1250 (500 Ca) MG chewable tablet Chew 1 tablet by mouth daily. 04/20/09   [provider]  Cholecalciferol 50 MCG (2000 UT) CAPS Take 1 capsule by mouth daily.    [provider]  cyanocobalamin 1000 MCG tablet Take 1,000 mcg by mouth daily.    [provider]  diclofenac Sodium (VOLTAREN) 1 % GEL Apply topically 4 (four) times daily.    [provider]  econazole nitrate 1 % cream Apply 1 application topically daily.    [provider]  estradiol (ESTRACE) 0.1 MG/GM vaginal cream Estrogen Cream Instruction Discard applicator Apply pea sized amount to tip of finger to urethra before bed. Wash hands well after application. Use Monday, Wednesday and Friday 03/04/21   Billey Co, MD  folic acid (FOLVITE) 1 MG tablet Take 1 mg by mouth daily. 01/17/19   [provider]  loperamide (IMODIUM) 2 MG capsule Take 2 mg by mouth as needed for diarrhea or loose stools.    [provider]  mirtazapine (REMERON) 30 MG tablet Take 15 mg by mouth at bedtime. 03/02/21   [provider]  ondansetron (ZOFRAN ODT) 4 MG disintegrating tablet Take 1 tablet (4 mg total) by mouth every 8 (eight) hours as needed for nausea or vomiting. 04/30/21   Olean Ree, MD  pantoprazole (PROTONIX) 40 MG tablet Take 40 mg by mouth daily.    [provider]  promethazine (PHENERGAN) 25 MG tablet Take 25 mg by mouth every 6 (six) hours as needed for nausea or vomiting.    [provider]  warfarin (COUMADIN) 2.5 MG tablet On Sundays and  Thursdays resume the 2.5 mg dose.  All other days take 5 mg. Patient taking differently: Take 2.5-5 mg by mouth daily at 12 noon. 2.5 mg sun Thursday, all other days is 5mg  03/14/21   Ronny Bacon, MD  warfarin (COUMADIN) 5 MG tablet Take by mouth. 03/26/21   [provider]    Allergies Metronidazole, Omeprazole, Bactrim [sulfamethoxazole-trimethoprim], and Codeine  Family History  Problem Relation Age of Onset   Breast cancer Neg Hx     Social History Social History   Tobacco Use   Smoking status: Never   Smokeless tobacco: Never  Vaping Use   Vaping Use: Never used  Substance Use Topics   Alcohol use: Never   Drug use: Never    Review of Systems  Review of Systems  Constitutional:  Negative for chills and fever.  HENT:  Negative for sore throat.   Eyes:  Negative for pain.  Respiratory:  Negative for cough and stridor.   Cardiovascular:  Negative for chest pain.  Gastrointestinal:  Positive for abdominal pain and constipation. Negative for vomiting.  Genitourinary:  Negative for dysuria.  Musculoskeletal:  Negative for myalgias.  Skin:  Negative for rash.  Neurological:  Negative for seizures, loss of consciousness and headaches.  Psychiatric/Behavioral:  Negative for suicidal ideas.   All other systems reviewed and are negative.    ____________________________________________   PHYSICAL EXAM:  VITAL SIGNS: ED Triage Vitals  Enc Vitals Group     BP 08/26/21 1636 (!) 171/91     Pulse Rate 08/26/21 1636 83     Resp 08/26/21 1636 16     Temp 08/26/21 1636 97.7 F (36.5 C)     Temp Source 08/26/21 1636 Oral     SpO2 08/26/21 1636 99 %     Weight 08/26/21 1636 89 lb (40.4 kg)     Height 08/26/21 1636 5\' 1"  (1.549 m)     Head Circumference --      Peak Flow --      Pain Score 08/26/21 1644 8     Pain Loc --      Pain Edu? --      Excl. in [place]? --    Vitals:   08/26/21 2100 08/26/21 2130  BP: (!) 152/104 (!) 135/100  Pulse: 80 90  Resp:  19   Temp:    SpO2: 100% 100%   Physical Exam Vitals and nursing note reviewed.  Constitutional:      General: She is not in acute distress.    Appearance: She is well-developed.  HENT:     Head: Normocephalic and atraumatic.     Right Ear: External ear normal.     Left Ear: External ear normal.     Nose: Nose normal.  Eyes:     Conjunctiva/sclera: Conjunctivae normal.  Cardiovascular:     Rate and Rhythm: Normal rate and regular rhythm.     Pulses: Normal pulses.     Heart sounds: No murmur heard. Pulmonary:     Effort: Pulmonary effort is normal. No respiratory distress.     Breath sounds: Normal breath sounds.  Abdominal:     Palpations: Abdomen is soft.     Tenderness: There is abdominal tenderness in the epigastric area and left upper quadrant. There is no right CVA tenderness or left CVA tenderness.  Musculoskeletal:     Cervical back: Neck supple.  Skin:    General: Skin is warm and dry.  Neurological:     Mental Status: She is alert and oriented to person, place, and time.  Psychiatric:        Mood and Affect: Mood normal.    Rectal exam is unremarkable for evidence of active bleeding and patient stools Hemoccult negative at this time. ____________________________________________   LABS (all labs ordered are listed, but only abnormal results are displayed)  Labs Reviewed  CBC WITH DIFFERENTIAL/PLATELET - Abnormal; Notable for the following components:      Result Value   RBC 3.03 (*)    Hemoglobin 10.3 (*)    HCT 32.0 (*)    MCV 105.6 (*)    RDW 15.9 (*)    All other components within normal limits  COMPREHENSIVE METABOLIC PANEL - Abnormal; Notable for the following components:   Glucose, Bld 120 (*)    All other components within normal limits  LIPASE, BLOOD  URINALYSIS, ROUTINE W REFLEX MICROSCOPIC  TROPONIN I (HIGH SENSITIVITY)   ____________________________________________  EKG  Sinus rhythm with a ventricular rate of 78, first-degree block with  a  PR interval of 224, some nonspecific changes versus artifact in inferior lateral leads without other clear evidence of acute ischemia or significant arrhythmia. ____________________________________________  RADIOLOGY  ED MD interpretation: CT abdomen pelvis shows some fecal retention consistent with constipation.  There is no evidence of SBO, ileus or stercoral colitis.  There are some chronic appearing gastric distention and small hiatal hernia.  There is also evidence of aortic atherosclerosis.  No evidence of kidney stone, pancreatitis, cholecystitis, pan ascites, diverticulitis or other acute abdominal pelvic process.  Official radiology report(s): CT ABDOMEN PELVIS W CONTRAST  Result Date: 08/26/2021 CLINICAL DATA:  Abdominal pain EXAM: CT ABDOMEN AND PELVIS WITH CONTRAST TECHNIQUE: Multidetector CT imaging of the abdomen and pelvis was performed using the standard protocol following bolus administration of intravenous contrast. CONTRAST:  76mL OMNIPAQUE IOHEXOL 350 MG/ML SOLN COMPARISON:  04/23/2021 FINDINGS: Lower chest: No acute pleural or parenchymal lung disease. Small hiatal hernia. Hepatobiliary: Gallbladder is unremarkable. No focal liver abnormality. Mild chronic intra and extrahepatic biliary duct dilation unchanged. Pancreas: No acute abnormalities. Chronic mild pancreatic duct dilation. Spleen: Normal in size without focal abnormality. Adrenals/Urinary Tract: Kidneys enhance normally and symmetrically. No urinary tract calculi or obstruction. Bladder is minimally distended with no gross abnormalities. The adrenals are stable. Stomach/Bowel: No evidence of bowel obstruction. Moderate retained stool throughout the colon. Chronic gastric distension. Evaluation of the bowel is limited without oral contrast in this patient with minimal intraperitoneal fat. No evidence of bowel wall thickening or inflammatory change. Vascular/Lymphatic: Aortic atherosclerosis. No enlarged abdominal or pelvic  lymph nodes. Reproductive: Uterus and bilateral adnexa are unremarkable. Other: No free intraperitoneal fluid or free gas. Stable small left femoral hernia with small amount of fluid unchanged. No bowel herniation. Musculoskeletal: Stable left convex thoracolumbar scoliosis and multilevel spondylosis. There are no acute bony abnormalities. Reconstructed images demonstrate no additional findings. IMPRESSION: 1. Moderate fecal retention consistent with constipation. No bowel obstruction or ileus. 2. Chronic gastric distension which could reflect underlying gastroparesis. 3. Small hiatal hernia. 4.  Aortic Atherosclerosis (ICD10-I70.0). Electronically Signed   By: Randa Ngo M.D.   On: 08/26/2021 18:03    ____________________________________________   PROCEDURES  Procedure(s) performed (including Critical Care):  Procedures   ____________________________________________   INITIAL IMPRESSION / ASSESSMENT AND PLAN / ED COURSE      Patient presents with above-stated history exam for assessment of some acute epigastric pain started after lunch today.  She endorsed some nausea but no other clear associated sick symptoms.  She states her stools are extremely loose and she thinks he may have little diarrhea as well as Colace.  On arrival she is hypertensive with otherwise stable vital signs on arrival.  She has mild tenderness in left upper quadrant but is not peritonitis or guarding and there is no CVA tenderness.  Given patient's history differential includes atypical presentation for ACS, possible partial SBO, recurrent volvulus, acute infectious gastroenteritis, peptic ulcer disease, ileus, constipation, stercoral colitis, kidney stone, cholecystitis and pancreatitis.  There are no urinary symptoms or lower abdominal pain to suggest cystitis at this time.  CBC shows no leukocytosis or acute anemia.  CMP shows no significant electrolyte metabolic derangements.  No evidence of hepatitis or  cholestasis.  Lipase not consistent with acute pancreatitis.  ECG and troponin not consistent with atypical presentation for ACS.  UA is unremarkable.  CT abdomen pelvis shows some fecal retention consistent with constipation.  There is no evidence of SBO, ileus or stercoral colitis.  There are some chronic  appearing gastric distention and small hiatal hernia.  There is also evidence of aortic atherosclerosis.  No evidence of kidney stone, pancreatitis, cholecystitis, pan ascites, diverticulitis or other acute abdominal pelvic process.  Suspect possible peptic ulcer disease versus endometritis and pain related to dilation of the bowels from fairly extensive gas and reminded patient of CT in the setting of significant stool burden.  Patient given below noted GI cocktail and on reassessment her abdominal pain improved but her nausea is still present.  She was then given a dose of IV Zofran and following this state her nausea has been better now since her abdominal pain is better.  However she was able to tolerate p.o. after this.  Unsure if this is related to peptic ulcer or gastritis or from distention constipation.  However given she is able tolerate p.o. with otherwise stable vitals and reassuring work-up I think she is stable for discharge with close outpatient GI and PCP follow-up.  She already has Protonix at home and Zofran.  We will add Carafate as well.  Discharged stable condition.  Strict return cautions advised and discussed.     ____________________________________________   FINAL CLINICAL IMPRESSION(S) / ED DIAGNOSES  Final diagnoses:  Epigastric abdominal pain  Chronic anemia  Nausea    Medications  fentaNYL (SUBLIMAZE) injection 25 mcg (25 mcg Intravenous Given 08/26/21 1721)  iohexol (OMNIPAQUE) 350 MG/ML injection 75 mL (75 mLs Intravenous Contrast Given 08/26/21 1741)  alum & mag hydroxide-simeth (MAALOX/MYLANTA) 200-200-20 MG/5ML suspension 30 mL (30 mLs Oral Given 08/26/21 2053)   metoCLOPramide (REGLAN) injection 10 mg (10 mg Intravenous Given 08/26/21 2053)  pantoprazole (PROTONIX) EC tablet 40 mg (40 mg Oral Given 08/26/21 2054)  sucralfate (CARAFATE) tablet 1 g (1 g Oral Given 08/26/21 2054)  ondansetron (ZOFRAN) injection 4 mg (4 mg Intravenous Given 08/26/21 2155)     ED Discharge Orders          Ordered    sucralfate (CARAFATE) 1 g tablet  3 times daily with meals & bedtime        08/26/21 2220             Note:  This document was prepared using Dragon voice recognition software and may include unintentional dictation errors.    Lucrezia Starch, MD 08/26/21 2222

## 2021-08-26 NOTE — ED Notes (Signed)
Daughter up to nurse's station states pt's pain is worse.

## 2021-08-26 NOTE — ED Notes (Signed)
Pt and daughter updated on wait times, pt continues to complain of pain, advised them that an ED room should be available soon after people are discharged and moved to the floor. Pt and family verbalize understanding. No distress noted at this time, however, pt does appear to be uncomfortable.

## 2021-08-26 NOTE — Telephone Encounter (Signed)
Call returned to patient and advised per verbal order Dr Janese Banks that she needs to go to [facility] where she can get a CT is needed. Patient agreeable with this and said she will go as soon as she can get her daughter to take her after she gets off work

## 2021-08-26 NOTE — ED Provider Notes (Signed)
HPI: Pt is a 87 y.o. female who presents with complaints of abdominal pain  The patient p/w  abdominal pain feels like obstruction   ROS: Denies fever, chest pain, vomiting  Past Medical History:  Diagnosis Date   Ductal carcinoma in situ (DCIS) of left breast    Dysphagia    GERD (gastroesophageal reflux disease)    Hiatal hernia    Hypertension    Iron deficiency anemia    Osteoporosis    Pulmonary embolism (HCC)    Scoliosis    UTI (urinary tract infection)    Vitals:   08/26/21 1636  BP: (!) 171/91  Pulse: 83  Resp: 16  Temp: 97.7 F (36.5 C)  SpO2: 99%    Focused Physical Exam: Gen: No acute distress Head: atraumatic, normocephalic Eyes: Extraocular movements grossly intact; conjunctiva clear CV: RRR Lung: No increased WOB, no stridor GI: ND, no obvious masses, tender upper abd, old scar  Neuro: Alert and awake  Medical Decision Making and Plan: Given the patient's initial medical screening exam, the following diagnostic evaluation has been ordered. The patient will be placed in the appropriate treatment space, once one is available, to complete the evaluation and treatment. I have discussed the plan of care with the patient and I have advised the patient that an ED physician or mid-level practitioner will reevaluate their condition after the test results have been received, as the results may give them additional insight into the type of treatment they may need.   Diagnostics: CT   Treatments: none immediately   Vanessa [place], MD 08/26/21 1642

## 2021-10-28 ENCOUNTER — Other Ambulatory Visit: Payer: Self-pay

## 2021-10-28 ENCOUNTER — Encounter: Payer: Self-pay | Admitting: Emergency Medicine

## 2021-10-28 ENCOUNTER — Emergency Department
Admission: EM | Admit: 2021-10-28 | Discharge: 2021-10-28 | Disposition: A | Discharge status: Home / Self Care | Payer: Medicare Other | Attending: Emergency Medicine | Admitting: Emergency Medicine

## 2021-10-28 ENCOUNTER — Emergency Department: Payer: Medicare Other

## 2021-10-28 DIAGNOSIS — N1831 Chronic kidney disease, stage 3a: Secondary | ICD-10-CM | POA: Insufficient documentation

## 2021-10-28 DIAGNOSIS — Z20822 Contact with and (suspected) exposure to covid-19: Secondary | ICD-10-CM | POA: Insufficient documentation

## 2021-10-28 DIAGNOSIS — R0789 Other chest pain: Secondary | ICD-10-CM | POA: Insufficient documentation

## 2021-10-28 DIAGNOSIS — R000 Tachycardia, unspecified: Secondary | ICD-10-CM | POA: Insufficient documentation

## 2021-10-28 DIAGNOSIS — Z79899 Other long term (current) drug therapy: Secondary | ICD-10-CM | POA: Insufficient documentation

## 2021-10-28 DIAGNOSIS — Z85038 Personal history of other malignant neoplasm of large intestine: Secondary | ICD-10-CM | POA: Diagnosis not present

## 2021-10-28 DIAGNOSIS — Z85828 Personal history of other malignant neoplasm of skin: Secondary | ICD-10-CM | POA: Diagnosis not present

## 2021-10-28 DIAGNOSIS — Z7901 Long term (current) use of anticoagulants: Secondary | ICD-10-CM | POA: Insufficient documentation

## 2021-10-28 DIAGNOSIS — I129 Hypertensive chronic kidney disease with stage 1 through stage 4 chronic kidney disease, or unspecified chronic kidney disease: Secondary | ICD-10-CM | POA: Insufficient documentation

## 2021-10-28 DIAGNOSIS — Z853 Personal history of malignant neoplasm of breast: Secondary | ICD-10-CM | POA: Diagnosis not present

## 2021-10-28 DIAGNOSIS — R079 Chest pain, unspecified: Secondary | ICD-10-CM

## 2021-10-28 LAB — BASIC METABOLIC PANEL
Anion gap: 8 (ref 5–15)
BUN: 20 mg/dL (ref 8–23)
CO2: 27 mmol/L (ref 22–32)
Calcium: 9.2 mg/dL (ref 8.9–10.3)
Chloride: 97 mmol/L — ABNORMAL LOW (ref 98–111)
Creatinine, Ser: 0.72 mg/dL (ref 0.44–1.00)
GFR, Estimated: >60 mL/min (ref >60)
Glucose, Bld: 106 mg/dL — ABNORMAL HIGH (ref 70–99)
Potassium: 4.3 mmol/L (ref 3.5–5.1)
Sodium: 132 mmol/L — ABNORMAL LOW (ref 135–145)

## 2021-10-28 LAB — CBC
HCT: 32.9 % — ABNORMAL LOW (ref 36.0–46.0)
Hemoglobin: 10.6 g/dL — ABNORMAL LOW (ref 12.0–15.0)
MCH: 31 pg (ref 26.0–34.0)
MCHC: 32.2 g/dL (ref 30.0–36.0)
MCV: 96.2 fL (ref 80.0–100.0)
Platelets: 444 10*3/uL — ABNORMAL HIGH (ref 150–400)
RBC: 3.42 MIL/uL — ABNORMAL LOW (ref 3.87–5.11)
RDW: 13.9 % (ref 11.5–15.5)
WBC: 9 10*3/uL (ref 4.0–10.5)
nRBC: 0 % (ref 0.0–0.2)

## 2021-10-28 LAB — TROPONIN I (HIGH SENSITIVITY)
Troponin I (High Sensitivity): 7 ng/L (ref <18)
Troponin I (High Sensitivity): 7 ng/L (ref <18)

## 2021-10-28 LAB — RESP PANEL BY RT-PCR (FLU A&B, COVID) ARPGX2
Influenza A by PCR: NEGATIVE
Influenza B by PCR: NEGATIVE
SARS Coronavirus 2 by RT PCR: NEGATIVE

## 2021-10-28 LAB — PROTIME-INR
INR: 3.5 — ABNORMAL HIGH (ref 0.8–1.2)
Prothrombin Time: 34.9 seconds — ABNORMAL HIGH (ref 11.4–15.2)

## 2021-10-28 MED ORDER — ACETAMINOPHEN 500 MG PO TABS
1000.0000 mg | ORAL_TABLET | Freq: Once | ORAL | Status: AC
  Administered 2021-10-28: 1000 mg via ORAL
  Filled 2021-10-28: qty 2

## 2021-10-28 NOTE — ED Provider Notes (Signed)
[facility] Emergency Department Provider Note  ____________________________________________   Event Date/Time   First MD Initiated Contact with Patient 10/28/21 [phone]     (approximate)  I have reviewed the triage vital signs and the nursing notes.   HISTORY  Chief Complaint Chest Pain   HPI RIVKAH WOLZ is a 87 y.o. female with medical history of PE on Coumadin, levothyroxine, HTN, GERD, dysphagia, breast cancer, and CKD who presents with acute onset left-sided chest patient states she felt a pull yesterday and then again last night.  She states this precipitated by twisting or rolling over in bed but otherwise not by exertion that she does not feel currently when she is walking using her walker.  States started a grandchild who is warning.  States denies any fevers states she has had some cough and congestion over the last 2 to 3 weeks as well.  Endorses chronic postnasal drip but denies any headache, earache, sore throat, abdominal pain, nausea, vomiting, diarrhea.  Patient denies rash or recent injuries or falls.  No new medication changes.  No other concerns at this time         Past Medical History:  Diagnosis Date   Ductal carcinoma in situ (DCIS) of left breast    Dysphagia    GERD (gastroesophageal reflux disease)    Hiatal hernia    Hypertension    Iron deficiency anemia    Osteoporosis    Pulmonary embolism ([place])    Scoliosis    UTI (urinary tract infection)     Patient Active Problem List   Diagnosis Date Noted   Basal cell carcinoma of leg 04/01/2021   Coronary atherosclerosis 04/01/2021   History of pulmonary embolism 04/01/2021   Pulmonary nodule 04/01/2021   Malignant neoplasm of colon ([place]) 03/29/2021   Rectal bleeding 03/15/2021   Hypertension    GERD (gastroesophageal reflux disease)    Colonic mass    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[place]) 03/10/2021   Anemia, unspecified 03/06/2021   Bronchiectasis without complication ([place])  07/62/2633   Iron deficiency anemia 03/27/2020   Moderate aortic stenosis 03/27/2020   Stage 3a chronic kidney disease ([place]) 07/04/2019   Hypnic headache 05/28/2019   Telogen effluvium 04/17/2019   Xerosis cutis 04/17/2019   Essential hypertension 01/14/2019   Cervical radiculopathy 05/04/2018   Osteoarthritis of left glenohumeral joint 05/04/2018   Vitamin B12 deficiency 01/01/2018   Vascular abnormality 09/11/2017   Dyspepsia 03/28/2017   Pulmonary embolism ([place]) 08/16/2016   Dilated pore of [place] 03/23/2015   Retinal drusen of both eyes 08/28/2014   Status post right cataract extraction 07/30/2014   [place] 03/19/2014   Chronic back pain 11/06/2013   Postmenopausal osteoporosis 06/11/2013   Anticoagulated on Coumadin 04/06/2013   Long term (current) use of anticoagulants 10/18/2012   Preglaucoma 05/10/2012   Presbyopia 05/10/2012   Senile nuclear sclerosis 05/10/2012   Lobular carcinoma in situ of breast 04/13/2012   Closed fracture of ankle 02/27/2012   History of nonmelanoma skin cancer 09/05/2011   Other benign neoplasm of skin of trunk 09/05/2011   Osteoporosis 08/25/2011   Transient alteration of awareness 09/03/2009   Colon adenoma 07/30/2002    Past Surgical History:  Procedure Laterality Date   BREAST LUMPECTOMY Left 2009   Ductal Carcinoma insitu    CATARACT EXTRACTION, BILATERAL     COLONOSCOPY N/A 03/17/2021   Procedure: COLONOSCOPY;  Surgeon: Lesly Rubenstein, MD;  Location: [facility] ENDOSCOPY;  Service: Endoscopy;  Laterality: N/A;   COLOSTOMY  REVISION Right 03/18/2021   Procedure: COLON RESECTION RIGHT;  Surgeon: Olean Ree, MD;  Location: [facility] ORS;  Service: General;  Laterality: Right;   LAPAROSCOPIC PARAESOPHAGEAL HERNIA REPAIR  2009   LAPAROTOMY N/A 03/10/2021   Procedure: EXPLORATORY LAPAROTOMY;  Surgeon: Olean Ree, MD;  Location: [facility] ORS;  Service: General;  Laterality: N/A;   TUBAL LIGATION      Prior to Admission medications   Medication  Sig Start Date End Date Taking? Authorizing Provider  acetaminophen (TYLENOL) 500 MG tablet Take 500 mg by mouth at bedtime as needed for mild pain, fever or headache.    [provider]  calcium carbonate (OS-CAL) 1250 (500 Ca) MG chewable tablet Chew 1 tablet by mouth daily. 04/20/09   [provider]  Cholecalciferol 50 MCG (2000 UT) CAPS Take 1 capsule by mouth daily.    [provider]  cyanocobalamin 1000 MCG tablet Take 1,000 mcg by mouth daily.    [provider]  diclofenac Sodium (VOLTAREN) 1 % GEL Apply topically 4 (four) times daily.    [provider]  econazole nitrate 1 % cream Apply 1 application topically daily.    [provider]  estradiol (ESTRACE) 0.1 MG/GM vaginal cream Estrogen Cream Instruction Discard applicator Apply pea sized amount to tip of finger to urethra before bed. Wash hands well after application. Use Monday, Wednesday and Friday 03/04/21   Billey Co, MD  folic acid (FOLVITE) 1 MG tablet Take 1 mg by mouth daily. 01/17/19   [provider]  loperamide (IMODIUM) 2 MG capsule Take 2 mg by mouth as needed for diarrhea or loose stools.    [provider]  mirtazapine (REMERON) 30 MG tablet Take 15 mg by mouth at bedtime. 03/02/21   [provider]  ondansetron (ZOFRAN ODT) 4 MG disintegrating tablet Take 1 tablet (4 mg total) by mouth every 8 (eight) hours as needed for nausea or vomiting. 04/30/21   Olean Ree, MD  pantoprazole (PROTONIX) 40 MG tablet Take 40 mg by mouth daily.    [provider]  promethazine (PHENERGAN) 25 MG tablet Take 25 mg by mouth every 6 (six) hours as needed for nausea or vomiting.    [provider]  sucralfate (CARAFATE) 1 g tablet Take 1 tablet (1 g total) by mouth 4 (four) times daily -  with meals and at bedtime for 3 days. 08/26/21 08/29/21  Lucrezia Starch, MD  warfarin (COUMADIN) 2.5 MG tablet On Sundays and Thursdays resume the 2.5 mg  dose.  All other days take 5 mg. Patient taking differently: Take 2.5-5 mg by mouth daily at 12 noon. 2.5 mg sun Thursday, all other days is 5mg  03/14/21   Ronny Bacon, MD  warfarin (COUMADIN) 5 MG tablet Take by mouth. 03/26/21   [provider]    Allergies Metronidazole, Omeprazole, Bactrim [sulfamethoxazole-trimethoprim], and Codeine  Family History  Problem Relation Age of Onset   Breast cancer Neg Hx     Social History Social History   Tobacco Use   Smoking status: Never   Smokeless tobacco: Never  Vaping Use   Vaping Use: Never used  Substance Use Topics   Alcohol use: Never   Drug use: Never    Review of Systems  Review of Systems  Constitutional:  Negative for chills and fever.  HENT:  Negative for sore throat.   Eyes:  Negative for pain.  Respiratory:  Negative for cough and stridor.   Cardiovascular:  Positive for chest  pain.  Gastrointestinal:  Negative for vomiting.  Genitourinary:  Negative for dysuria.  Musculoskeletal:  Negative for myalgias.  Skin:  Negative for rash.  Neurological:  Negative for seizures, loss of consciousness and headaches.  Psychiatric/Behavioral:  Negative for suicidal ideas.   All other systems reviewed and are negative.    ____________________________________________   PHYSICAL EXAM:  VITAL SIGNS: ED Triage Vitals  Enc Vitals Group     BP 10/28/21 0725 130/77     Pulse Rate 10/28/21 0725 (!) 109     Resp 10/28/21 0725 18     Temp 10/28/21 0725 98.2 F (36.8 C)     Temp Source 10/28/21 0725 Oral     SpO2 10/28/21 0725 94 %     Weight 10/28/21 0722 89 lb (40.4 kg)     Height 10/28/21 0722 5\' 1"  (1.549 m)     Head Circumference --      Peak Flow --      Pain Score 10/28/21 0721 7     Pain Loc --      Pain Edu? --      Excl. in [place]? --    Vitals:   10/28/21 0725 10/28/21 0830  BP: 130/77 (!) 126/94  Pulse: (!) 109 (!) 124  Resp: 18 (!) 24  Temp: 98.2 F (36.8 C)   SpO2: 94% 99%   Physical  Exam Vitals and nursing note reviewed.  Constitutional:      General: She is not in acute distress.    Appearance: She is well-developed.  HENT:     Head: Normocephalic and atraumatic.     Right Ear: External ear normal.     Left Ear: External ear normal.     Nose: Nose normal.  Eyes:     Conjunctiva/sclera: Conjunctivae normal.  Cardiovascular:     Rate and Rhythm: Regular rhythm. Tachycardia present.     Heart sounds: No murmur heard. Pulmonary:     Effort: Pulmonary effort is normal. No respiratory distress.     Breath sounds: Normal breath sounds.  Abdominal:     Palpations: Abdomen is soft.     Tenderness: There is no abdominal tenderness.  Musculoskeletal:        General: No swelling.     Cervical back: Neck supple.  Skin:    General: Skin is warm and dry.     Capillary Refill: Capillary refill takes less than 2 seconds.  Neurological:     Mental Status: She is alert and oriented to person, place, and time.  Psychiatric:        Mood and Affect: Mood normal.     ____________________________________________   LABS (all labs ordered are listed, but only abnormal results are displayed)  Labs Reviewed  BASIC METABOLIC PANEL - Abnormal; Notable for the following components:      Result Value   Sodium 132 (*)    Chloride 97 (*)    Glucose, Bld 106 (*)    All other components within normal limits  CBC - Abnormal; Notable for the following components:   RBC 3.42 (*)    Hemoglobin 10.6 (*)    HCT 32.9 (*)    Platelets 444 (*)    All other components within normal limits  PROTIME-INR - Abnormal; Notable for the following components:   Prothrombin Time 34.9 (*)    INR 3.5 (*)    All other components within normal limits  RESP PANEL BY RT-PCR (FLU A&B, COVID) ARPGX2  TROPONIN I (HIGH SENSITIVITY)  TROPONIN I (HIGH SENSITIVITY)   ____________________________________________  EKG  ECG remarkable for sinus tachycardia with a ventricular rate of 109 left axis  levels are leads and nonspecific changes in anterior leads V2 V3 without other gross acute ischemia or significant arrhythmia. ____________________________________________  RADIOLOGY  ED MD interpretation: Chest x-ray remarkable for some mild basilar atelectasis without overt edema, consolidation, effusion or other acute thoracic process.  Official radiology report(s): DG Chest 2 View  Result Date: 10/28/2021 CLINICAL DATA:  Left-sided chest pain. EXAM: CHEST - 2 VIEW COMPARISON:  Chest radiographs 03/09/2021 and CT 04/22/2021 FINDINGS: The cardiomediastinal silhouette is grossly unchanged allowing for patient rotation. There is mild chronic elevation of the right hemidiaphragm. Mild bibasilar opacities likely reflect atelectasis or scarring. There may be trace pleural effusions. No pneumothorax is identified. There is severe lumbar levoscoliosis, and there is increased thoracic kyphosis. IMPRESSION: Mild bibasilar atelectasis or scarring. Electronically Signed   By: Logan Bores M.D.   On: 10/28/2021 08:09    ____________________________________________   PROCEDURES  Procedure(s) performed (including Critical Care):  Procedures   ____________________________________________   INITIAL IMPRESSION / ASSESSMENT AND PLAN / ED COURSE      Patient presents with above-stated history exam for assessment of some left-sided chest discomfort she states she fell this morning when rolling over in bed similar to pain she has 2 nights ago.  She is currently chest pain-free and states she does not have pain with walking walker.  Nursing subacute cough but no other clear associated sick symptoms.  Outpatient slight tachycardic with otherwise stable vital signs on room air.  Primary differential includes ACS, arrhythmia, pneumonia, bronchitis, pneumothorax, posterior mastoiditis versus pleurisy.  Patient given.  Symmetric pulses palpable Evalose patient for dissection at this time.  ECG remarkable for  sinus tachycardia with a ventricular rate of 109 left axis levels are leads and nonspecific changes in anterior leads V2 V3 without other gross acute ischemia or significant arrhythmia.  Given nonelevated troponin x2 Evalose suspicion for ACS or myocarditis.  Chest x-ray remarkable for some mild basilar atelectasis without overt edema, consolidation, effusion or other acute thoracic process.  BMP shows no significant electrolyte or metabolic derangements.  CBC shows no leukocytosis and hemoglobin of 10.6 compared to 6.32.30.  This is nonspecific symptomatic anemia.  Patient observed for several hours in emergency room to contain stable vitals and denied any recurrence of pain.  Patient stable vitals otherwise.  She is stable for discharge with close outpatient follow-up.  On my assessment and discharge patient's heart rate is in the 80s.  Discharged in stable condition.  Strict return cautions advised and discussed.       ____________________________________________   FINAL CLINICAL IMPRESSION(S) / ED DIAGNOSES  Final diagnoses:  Chest pain, unspecified type    Medications  acetaminophen (TYLENOL) tablet 1,000 mg (1,000 mg Oral Given 10/28/21 8676)     ED Discharge Orders     None        Note:  This document was prepared using Dragon voice recognition software and may include unintentional dictation errors.    Lucrezia Starch, MD 10/28/21 1032

## 2021-10-28 NOTE — ED Notes (Signed)
Pt states that she has been having chest pain in the sternal area that started around two days ago. Pt states that it hurts laying down but pain went away when she walks. When MD palpated chest area pt states pain was felt then and went away after.

## 2021-10-28 NOTE — ED Notes (Signed)
D/C and reasons to return to ED discussed with pt and daughter, both verbalized understanding. Pt ambulatory with steady gait on D/C with cane. NAD noted. VSS on D/C.

## 2021-10-28 NOTE — ED Triage Notes (Signed)
Pt to ED via [facility] with c/o Chest pain that is on the left lower side with movement. She had the same pain last night but it went away. Her pain comes and goes. She describes it as a stabbing pain in the left side that hurts when she tried to get out of bed. She feels like she has congestion. Denies N/V or diaphoresis. Has some SHOB

## 2021-11-01 ENCOUNTER — Inpatient Hospital Stay: Payer: Medicare Other | Attending: Oncology

## 2021-11-01 ENCOUNTER — Other Ambulatory Visit: Payer: Self-pay

## 2021-11-01 DIAGNOSIS — C189 Malignant neoplasm of colon, unspecified: Secondary | ICD-10-CM | POA: Insufficient documentation

## 2021-11-01 DIAGNOSIS — I350 Nonrheumatic aortic (valve) stenosis: Secondary | ICD-10-CM

## 2021-11-01 DIAGNOSIS — D509 Iron deficiency anemia, unspecified: Secondary | ICD-10-CM | POA: Insufficient documentation

## 2021-11-01 LAB — CBC
HCT: 35.8 % — ABNORMAL LOW (ref 36.0–46.0)
Hemoglobin: 11.3 g/dL — ABNORMAL LOW (ref 12.0–15.0)
MCH: 31.2 pg (ref 26.0–34.0)
MCHC: 31.6 g/dL (ref 30.0–36.0)
MCV: 98.9 fL (ref 80.0–100.0)
Platelets: 419 10*3/uL — ABNORMAL HIGH (ref 150–400)
RBC: 3.62 MIL/uL — ABNORMAL LOW (ref 3.87–5.11)
RDW: 13.9 % (ref 11.5–15.5)
WBC: 5.9 10*3/uL (ref 4.0–10.5)
nRBC: 0 % (ref 0.0–0.2)

## 2021-11-01 LAB — IRON AND TIBC
Iron: 27 ug/dL — ABNORMAL LOW (ref 28–170)
Saturation Ratios: 10 % — ABNORMAL LOW (ref 10.4–31.8)
TIBC: 260 ug/dL (ref 250–450)
UIBC: 233 ug/dL

## 2021-11-01 LAB — FERRITIN: Ferritin: 141 ng/mL (ref 11–307)

## 2021-11-02 ENCOUNTER — Inpatient Hospital Stay: Payer: Medicare Other

## 2021-11-02 ENCOUNTER — Inpatient Hospital Stay (HOSPITAL_BASED_OUTPATIENT_CLINIC_OR_DEPARTMENT_OTHER): Payer: Medicare Other | Admitting: Oncology

## 2021-11-02 ENCOUNTER — Encounter: Payer: Self-pay | Admitting: Oncology

## 2021-11-02 VITALS — BP 139/84 | HR 89 | Temp 97.2°F | Resp 14 | Wt 96.1 lb

## 2021-11-02 DIAGNOSIS — C189 Malignant neoplasm of colon, unspecified: Secondary | ICD-10-CM | POA: Diagnosis not present

## 2021-11-02 DIAGNOSIS — Z85038 Personal history of other malignant neoplasm of large intestine: Secondary | ICD-10-CM

## 2021-11-02 DIAGNOSIS — D509 Iron deficiency anemia, unspecified: Secondary | ICD-10-CM | POA: Diagnosis not present

## 2021-11-02 DIAGNOSIS — Z08 Encounter for follow-up examination after completed treatment for malignant neoplasm: Secondary | ICD-10-CM

## 2021-11-02 NOTE — Progress Notes (Signed)
Hematology/Oncology Consult note [facility]  Telephone:([phone] [phone]  Patient Care Team: Sigel as PCP - General Kate Sable, MD as PCP - Cardiology (Cardiology) Sindy Guadeloupe, MD as Consulting Physician (Hematology and Oncology)   Name of the patient: Kristina Rowe  [id number]  1930/05/24   Date of visit: 11/02/21  Diagnosis-stage III colon cancer  Chief complaint/ Reason for visit-routine follow-up of colon cancer  Heme/Onc history: Patient is a 87 year old female who initially presented to the ER with symptoms of significant abdominal pain.  CT abdomen and pelvis on 03/10/2021 showed volvulus along with a large hiatal hernia.  Patient initially had an exploratory laparotomy with lysis of adhesion by Dr. Hampton Abbot.  She then presented back to the ER with similar complaints and a repeat CT abdomen at that time showed bulky ascending colon mass compatible with carcinoma and possible local regional adenopathy.  No recurrent volvulus.  Patient underwent open right colectomy on 03/18/2021.  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  All regional lymph nodes 32 of them were negative for tumor but there was a tumor deposit present at 1 site.  PT3PN1C.   Risks versus benefits of adjuvant chemotherapy was discussed and patient was not deemed to be a candidate for adjuvant chemotherapy and she did not wish to go through chemotherapy herself.  Anemia work-up was consistent with iron deficiency and patient received 2 doses of Feraheme    Interval history-patient is currently doing well for range.  She lives independently and is able to manage her ADLs.  She has occasional bouts of nausea.  She was in the hospital in September again for worsening abdominal pain and had a CT scan which showed constipation but no evidence of progressive malignancy.  Denies any blood  loss in her stool or urine  ECOG PS- 2 Pain scale- 0   Review of systems- Review of Systems  Constitutional:  Negative for chills, fever, malaise/fatigue and weight loss.  HENT:  Negative for congestion, ear discharge and nosebleeds.   Eyes:  Negative for blurred vision.  Respiratory:  Negative for cough, hemoptysis, sputum production, shortness of breath and wheezing.   Cardiovascular:  Negative for chest pain, palpitations, orthopnea and claudication.  Gastrointestinal:  Positive for nausea. Negative for abdominal pain, blood in stool, constipation, diarrhea, heartburn, melena and vomiting.  Genitourinary:  Negative for dysuria, flank pain, frequency, hematuria and urgency.  Musculoskeletal:  Negative for back pain, joint pain and myalgias.  Skin:  Negative for rash.  Neurological:  Negative for dizziness, tingling, focal weakness, seizures, weakness and headaches.  Endo/Heme/Allergies:  Does not bruise/bleed easily.  Psychiatric/Behavioral:  Negative for depression and suicidal ideas. The patient does not have insomnia.     Allergies  Allergen Reactions   Metronidazole Other (See Comments)    Other reaction(s): Other Mouth sores Mouth sores Mouth sores    Omeprazole Other (See Comments)    Other reaction(s): Arthralgias (intolerance) Other reaction(s): Other    Bactrim [Sulfamethoxazole-Trimethoprim] Nausea Only   Codeine Nausea And Vomiting and Nausea Only    Other reaction(s): Nausea Only But tolerates tramadol      Past Medical History:  Diagnosis Date   Ductal carcinoma in situ (DCIS) of left breast    Dysphagia    GERD (gastroesophageal reflux disease)    Hiatal hernia    Hypertension    Iron deficiency anemia    Osteoporosis  Pulmonary embolism ([place])    Scoliosis    UTI (urinary tract infection)      Past Surgical History:  Procedure Laterality Date   BREAST LUMPECTOMY Left 2009   Ductal Carcinoma insitu    CATARACT EXTRACTION, BILATERAL      COLONOSCOPY N/A 03/17/2021   Procedure: COLONOSCOPY;  Surgeon: Lesly Rubenstein, MD;  Location: [facility] ENDOSCOPY;  Service: Endoscopy;  Laterality: N/A;   COLOSTOMY REVISION Right 03/18/2021   Procedure: COLON RESECTION RIGHT;  Surgeon: Olean Ree, MD;  Location: [facility] ORS;  Service: General;  Laterality: Right;   LAPAROSCOPIC PARAESOPHAGEAL HERNIA REPAIR  2009   LAPAROTOMY N/A 03/10/2021   Procedure: EXPLORATORY LAPAROTOMY;  Surgeon: Olean Ree, MD;  Location: [facility] ORS;  Service: General;  Laterality: N/A;   TUBAL LIGATION      Social History   Socioeconomic History   Marital status: Married    Spouse name: Not on file   Number of children: Not on file   Years of education: Not on file   Highest education level: Not on file  Occupational History   Not on file  Tobacco Use   Smoking status: Never   Smokeless tobacco: Never  Vaping Use   Vaping Use: Never used  Substance and Sexual Activity   Alcohol use: Never   Drug use: Never   Sexual activity: Not Currently  Other Topics Concern   Not on file  Social History Narrative   Not on file   Social Determinants of Health   Financial Resource Strain: Not on file  Food Insecurity: Not on file  Transportation Needs: Not on file  Physical Activity: Not on file  Stress: Not on file  Social Connections: Not on file  Intimate Partner Violence: Not on file    Family History  Problem Relation Age of Onset   Breast cancer Neg Hx      Current Outpatient Medications:    acetaminophen (TYLENOL) 500 MG tablet, Take 500 mg by mouth at bedtime as needed for mild pain, fever or headache., Disp: , Rfl:    ascorbic acid (VITAMIN C) 250 MG tablet, Take by mouth., Disp: , Rfl:    calcium carbonate (OS-CAL) 1250 (500 Ca) MG chewable tablet, Chew 1 tablet by mouth daily., Disp: , Rfl:    Cholecalciferol 50 MCG (2000 UT) CAPS, Take by mouth., Disp: , Rfl:    cyanocobalamin 1000 MCG tablet, Take 1,000 mcg by mouth daily., Disp: , Rfl:     diclofenac Sodium (VOLTAREN) 1 % GEL, Apply topically 4 (four) times daily., Disp: , Rfl:    econazole nitrate 1 % cream, Apply 1 application topically daily., Disp: , Rfl:    estradiol (ESTRACE) 0.1 MG/GM vaginal cream, Estrogen Cream Instruction Discard applicator Apply pea sized amount to tip of finger to urethra before bed. Wash hands well after application. Use Monday, Wednesday and Friday, Disp: 42.5 g, Rfl: 3   famotidine (PEPCID) 40 MG tablet, Take 40 mg by mouth 2 (two) times daily., Disp: , Rfl:    folic acid (FOLVITE) 1 MG tablet, Take 1 mg by mouth daily., Disp: , Rfl:    loperamide (IMODIUM) 2 MG capsule, Take 2 mg by mouth as needed for diarrhea or loose stools., Disp: , Rfl:    mirtazapine (REMERON) 15 MG tablet, Take 15 mg by mouth at bedtime., Disp: , Rfl:    mirtazapine (REMERON) 30 MG tablet, Take 15 mg by mouth at bedtime., Disp: , Rfl:    ondansetron (ZOFRAN ODT) 4  MG disintegrating tablet, Take 1 tablet (4 mg total) by mouth every 8 (eight) hours as needed for nausea or vomiting., Disp: 20 tablet, Rfl: 0   pantoprazole (PROTONIX) 40 MG tablet, Take 40 mg by mouth daily., Disp: , Rfl:    promethazine (PHENERGAN) 25 MG tablet, Take 25 mg by mouth every 6 (six) hours as needed for nausea or vomiting., Disp: , Rfl:    sucralfate (CARAFATE) 1 g tablet, Take 1 tablet (1 g total) by mouth 4 (four) times daily -  with meals and at bedtime for 3 days., Disp: 12 tablet, Rfl: 0   warfarin (COUMADIN) 2.5 MG tablet, On Sundays and Thursdays resume the 2.5 mg dose.  All other days take 5 mg. (Patient taking differently: Take 2.5-5 mg by mouth daily at 12 noon. 2.5 mg sun Thursday, all other days is 5mg ), Disp: 30 tablet, Rfl: 11   warfarin (COUMADIN) 5 MG tablet, Take by mouth., Disp: , Rfl:   Physical exam:  Vitals:   11/02/21 1139  BP: 139/84  Pulse: 89  Resp: 14  Temp: (!) 97.2 F (36.2 C)  SpO2: 100%  Weight: 96 lb 1.6 oz (43.6 kg)   Physical Exam Constitutional:       Comments: Thin frail elderly woman who is ambulating with a cane.  Appears in no acute distress  Cardiovascular:     Rate and Rhythm: Normal rate and regular rhythm.     Heart sounds: Normal heart sounds.  Pulmonary:     Effort: Pulmonary effort is normal.     Breath sounds: Normal breath sounds.  Abdominal:     General: There is no distension.     Palpations: Abdomen is soft.  Skin:    General: Skin is warm and dry.  Neurological:     Mental Status: She is alert and oriented to person, place, and time.     CMP Latest Ref Rng & Units 10/28/2021  Glucose 70 - 99 mg/dL 106(H)  BUN 8 - 23 mg/dL 20  Creatinine 0.44 - 1.00 mg/dL 0.72  Sodium 135 - 145 mmol/L 132(L)  Potassium 3.5 - 5.1 mmol/L 4.3  Chloride 98 - 111 mmol/L 97(L)  CO2 22 - 32 mmol/L 27  Calcium 8.9 - 10.3 mg/dL 9.2  Total Protein 6.5 - 8.1 g/dL -  Total Bilirubin 0.3 - 1.2 mg/dL -  Alkaline Phos 38 - 126 U/L -  AST 15 - 41 U/L -  ALT 0 - 44 U/L -   CBC Latest Ref Rng & Units 11/01/2021  WBC 4.0 - 10.5 K/uL 5.9  Hemoglobin 12.0 - 15.0 g/dL 11.3(L)  Hematocrit 36.0 - 46.0 % 35.8(L)  Platelets 150 - 400 K/uL 419(H)    No images are attached to the encounter.  DG Chest 2 View  Result Date: 10/28/2021 CLINICAL DATA:  Left-sided chest pain. EXAM: CHEST - 2 VIEW COMPARISON:  Chest radiographs 03/09/2021 and CT 04/22/2021 FINDINGS: The cardiomediastinal silhouette is grossly unchanged allowing for patient rotation. There is mild chronic elevation of the right hemidiaphragm. Mild bibasilar opacities likely reflect atelectasis or scarring. There may be trace pleural effusions. No pneumothorax is identified. There is severe lumbar levoscoliosis, and there is increased thoracic kyphosis. IMPRESSION: Mild bibasilar atelectasis or scarring. Electronically Signed   By: Logan Bores M.D.   On: 10/28/2021 08:09     Assessment and plan- Patient is a 87 y.o. female with history of stage III colon cancer and iron deficiency anemia  here for routine follow-up  Iron deficiency anemia: Patient's hemoglobin is presently improved to 11.3 and labs not suggestive of iron deficiency.  Ferritin levels are normal.  Repeat ferritin and iron studies in 3 in 6 months  Colon cancer: It would be reasonable to forego surveillance colonoscopies at this time given her age.  We will continue to do surveillance scans and she has a scan coming up with Dr. Hampton Abbot in December.  She will need a scan roughly every 6 months.  I will see her back in 4 months with CBC with differential CMP and CEA   Visit Diagnosis 1. Iron deficiency anemia, unspecified iron deficiency anemia type   2. Encounter for follow-up surveillance of colon cancer      Dr. Randa Evens, MD, MPH [facility] [phone] 11/02/2021 2:06 PM

## 2021-11-02 NOTE — Progress Notes (Signed)
Daughter states she believes her mom deals with lots of anxiety in regards to her health. Will like for provider to discuss about anxiety and depression.

## 2021-11-03 ENCOUNTER — Telehealth: Payer: Self-pay | Admitting: Surgery

## 2021-11-03 NOTE — Telephone Encounter (Signed)
Called and spoke with the patient and let her know to have the CT as scheduled on 11/08/21 since Dr Janese Banks had ordered it. She does not need any other imaging for [place]. She is aware and will follow up here on 11/10/21.

## 2021-11-03 NOTE — Telephone Encounter (Signed)
Patient is calling she is schedule to have a ct done on 11/08/21 patient said she has been in the ED here recently and is asking will she need to have another ct scan done for her appt with Dr. Hampton Abbot. Please call patient and advise.

## 2021-11-08 ENCOUNTER — Ambulatory Visit
Admission: RE | Admit: 2021-11-08 | Discharge: 2021-11-08 | Disposition: A | Discharge status: Home / Self Care | Payer: Medicare Other | Source: Ambulatory Visit | Attending: Oncology | Admitting: Oncology

## 2021-11-08 ENCOUNTER — Other Ambulatory Visit: Payer: Self-pay

## 2021-11-08 DIAGNOSIS — C182 Malignant neoplasm of ascending colon: Secondary | ICD-10-CM | POA: Diagnosis not present

## 2021-11-10 ENCOUNTER — Encounter: Payer: Self-pay | Admitting: Surgery

## 2021-11-10 ENCOUNTER — Ambulatory Visit (INDEPENDENT_AMBULATORY_CARE_PROVIDER_SITE_OTHER): Payer: Medicare Other | Admitting: Surgery

## 2021-11-10 ENCOUNTER — Other Ambulatory Visit: Payer: Self-pay

## 2021-11-10 VITALS — BP 151/90 | HR 90 | Temp 98.2°F | Ht 63.0 in | Wt 93.4 lb

## 2021-11-10 DIAGNOSIS — C189 Malignant neoplasm of colon, unspecified: Secondary | ICD-10-CM | POA: Diagnosis not present

## 2021-11-10 NOTE — Patient Instructions (Signed)
I have put you on the recall list. We will call you in March to set up your April appointment.   If you have any concerns or questions, please feel free to call our office.   High-Protein and High-Calorie Diet  Eating high-protein and high-calorie foods can help you to gain weight, heal after an injury, and recover after an illness or surgery. The specific amount of daily protein and calories you need depends on: Your body weight. The reason this diet is recommended for you. Generally, a high-protein, high-calorie diet involves: Eating 250-500 extra calories each day. Making sure that you get enough of your daily calories from protein. Ask your health care provider how many of your calories should come from protein. Talk with a health care provider or a dietitian about how much protein and how many calories you need each day. Follow the diet as directed by your health care provider. What are tips for following this plan? Reading food labels Check the nutrition facts label for calories, grams of fat and protein. Items with more than 4 grams of protein are high-protein foods. Preparing meals Add whole milk, half-and-half, or heavy cream to cereal, pudding, soup, or hot cocoa. Add whole milk to instant breakfast drinks. Add peanut butter to oatmeal or smoothies. Add powdered milk to baked goods, smoothies, or milkshakes. Add powdered milk, cream, or butter to mashed potatoes. Add cheese to cooked vegetables. Make whole-milk yogurt parfaits. Top them with granola, fruit, or nuts. Add cottage cheese to fruit. Add avocado, cheese, or both to sandwiches or salads. Add avocado to smoothies. Add meat, poultry, or seafood to rice, pasta, casseroles, salads, and soups. Use mayonnaise when making egg salad, chicken salad, or tuna salad. Use peanut butter as a dip for fruits and vegetables or as a topping for pretzels, celery, or crackers. Add beans to casseroles, dips, and spreads. Add pureed beans  to sauces and soups. Replace calorie-free drinks with calorie-containing drinks, such as milk and fruit juice. Replace water with milk or heavy cream when making foods such as oatmeal, pudding, or cocoa. Add oil or butter to cooked vegetables and grains. Add cream cheese to sandwiches or as a topping on crackers and bread. Make cream-based pastas and soups. General information Ask your health care provider if you should take a nutritional supplement. Try to eat six small meals each day instead of three large meals. A general goal is to eat every 2 to 3 hours. Eat a balanced diet. In each meal, include one food that is high in protein and one food with fat in it. Keep nutritious snacks available, such as nuts, trail mixes, dried fruit, and yogurt. If you have kidney disease or diabetes, talk with your health care provider about how much protein is safe for you. Too much protein may put extra stress on your kidneys. Drink your calories. Choose high-calorie drinks and have them after your meals. Consider setting a timer to remind you to eat. You will want to eat even if you do not feel very hungry. What high-protein foods should I eat? Vegetables Soybeans. Peas. Grains Quinoa. Bulgur wheat. Buckwheat. Meats and other proteins Beef, pork, and poultry. Fish and seafood. Eggs. Tofu. Textured vegetable protein (TVP). Peanut butter. Nuts and seeds. Dried beans. Protein powders. Hummus. Dairy Whole milk. Whole-milk yogurt. Powdered milk. Cheese. [employer]. Eggnog. Beverages High-protein supplement drinks. Soy milk. Other foods Protein bars. The items listed above may not be a complete list of foods and beverages you can  eat and drink. Contact a dietitian for more information. What high-calorie foods should I eat? Fruits Dried fruit. Fruit leather. Canned fruit in syrup. Fruit juice. Avocado. Vegetables Vegetables cooked in oil or butter. Fried potatoes. Grains Pasta. Quick breads.  Muffins. Pancakes. Ready-to-eat cereal. Meats and other proteins Peanut butter. Nuts and seeds. Dairy Heavy cream. Whipped cream. Cream cheese. Sour cream. Ice cream. Custard. Pudding. Whole milk dairy products. Beverages Meal-replacement beverages. Nutrition shakes. Fruit juice. Seasonings and condiments Salad dressing. Mayonnaise. Alfredo sauce. Fruit preserves or jelly. Honey. Syrup. Sweets and desserts Cake. Cookies. Pie. Pastries. Candy bars. Chocolate. Fats and oils Butter or margarine. Oil. Gravy. Other foods Meal-replacement bars. The items listed above may not be a complete list of foods and beverages you can eat and drink. Contact a dietitian for more information. Summary A high-protein, high-calorie diet can help you gain weight or heal faster after an injury, illness, or surgery. To increase your protein and calories, add ingredients such as whole milk, peanut butter, cheese, beans, meat, or seafood to meal items. To get enough extra calories each day, include high-calorie foods and beverages at each meal. Adding a high-calorie drink or shake can be an easy way to help you get enough calories each day. Talk with your healthcare provider or dietitian about the best options for you. This information is not intended to replace advice given to you by your health care provider. Make sure you discuss any questions you have with your health care provider. Document Revised: 10/18/2020 Document Reviewed: 10/18/2020 Elsevier Patient Education  2022 [employer].

## 2021-11-10 NOTE — Progress Notes (Signed)
11/10/2021  History of Present Illness: Kristina Rowe is a 87 y.o. female status post exploratory laparotomy for small bowel volvulus on 03/10/2021 with subsequent open right colectomy on 03/18/2021 for an obstructing right colon cancer.  She presents today for follow-up.  The patient has done remarkably well after surgery and has fully recovered.  She reports that she still not gaining weight yet but otherwise is eating well without any abdominal pain or nausea.  She feels that the food is able to go down more easily after her surgeries.  The only thing that she reports is some constipation but denies any blood in the stool.  She had a CT scan of the chest, abdomen, pelvis on 11/08/2021 which showed no evidence of metastatic disease.  However she does have some stool burden in her colon.  Past Medical History: Past Medical History:  Diagnosis Date   Ductal carcinoma in situ (DCIS) of left breast    Dysphagia    GERD (gastroesophageal reflux disease)    Hiatal hernia    Hypertension    Iron deficiency anemia    Osteoporosis    Pulmonary embolism (HCC)    Scoliosis    UTI (urinary tract infection)      Past Surgical History: Past Surgical History:  Procedure Laterality Date   BREAST LUMPECTOMY Left 2009   Ductal Carcinoma insitu    CATARACT EXTRACTION, BILATERAL     COLONOSCOPY N/A 03/17/2021   Procedure: COLONOSCOPY;  Surgeon: Lesly Rubenstein, MD;  Location: [facility] END[facility]COPY;  Service: Endoscopy;  Laterality: N/A;   COLOSTOMY REVISION Right 03/18/2021   Procedure: COLON RESECTION RIGHT;  Surgeon: Olean Ree, MD;  Location: [facility] ORS;  Service: General;  Laterality: Right;   LAPAROSCOPIC PARAESOPHAGEAL HERNIA REPAIR  2009   LAPAROTOMY N/A 03/10/2021   Procedure: EXPLORATORY LAPAROTOMY;  Surgeon: Olean Ree, MD;  Location: [facility] ORS;  Service: General;  Laterality: N/A;   TUBAL LIGATION      Home Medications: Prior to Admission medications   Medication Sig Start Date End Date  Taking? Authorizing Provider  acetaminophen (TYLENOL) 500 MG tablet Take 500 mg by mouth at bedtime as needed for mild pain, fever or headache.   Yes [provider]  ascorbic acid (VITAMIN C) 250 MG tablet Take by mouth.   Yes [provider]  calcium carbonate (OS-CAL) 1250 (500 Ca) MG chewable tablet Chew 1 tablet by mouth daily. 04/20/09  Yes [provider]  Cholecalciferol 50 MCG (2000 UT) CAPS Take by mouth.   Yes [provider]  cyanocobalamin 1000 MCG tablet Take 1,000 mcg by mouth daily.   Yes [provider]  diclofenac Sodium (VOLTAREN) 1 % GEL Apply topically 4 (four) times daily.   Yes [provider]  econazole nitrate 1 % cream Apply 1 application topically daily.   Yes [provider]  estradiol (ESTRACE) 0.1 MG/GM vaginal cream Estrogen Cream Instruction Discard applicator Apply pea sized amount to tip of finger to urethra before bed. Wash hands well after application. Use Monday, Wednesday and Friday 03/04/21  Yes Billey Co, MD  famotidine (PEPCID) 40 MG tablet Take 40 mg by mouth 2 (two) times daily. 10/24/21  Yes [provider]  folic acid (FOLVITE) 1 MG tablet Take 1 mg by mouth daily. 01/17/19  Yes [provider]  loperamide (IMODIUM) 2 MG capsule Take 2 mg by mouth as needed for diarrhea or loose stools.   Yes [provider]  mirtazapine (REMERON) 15 MG tablet  Take 15 mg by mouth at bedtime. 10/08/21  Yes [provider]  mirtazapine (REMERON) 30 MG tablet Take 15 mg by mouth at bedtime. 03/02/21  Yes [provider]  ondansetron (ZOFRAN ODT) 4 MG disintegrating tablet Take 1 tablet (4 mg total) by mouth every 8 (eight) hours as needed for nausea or vomiting. 04/30/21  Yes De Jaworski, Jacqulyn Bath, MD  pantoprazole (PROTONIX) 40 MG tablet Take 40 mg by mouth daily.   Yes [provider]  promethazine (PHENERGAN) 25 MG tablet Take 25 mg by mouth every 6 (six) hours as  needed for nausea or vomiting.   Yes [provider]  warfarin (COUMADIN) 2.5 MG tablet On Sundays and Thursdays resume the 2.5 mg dose.  All other days take 5 mg. Patient taking differently: Take 2.5-5 mg by mouth daily at 12 noon. 2.5 mg sun Thursday, all other days is 5mg  03/14/21  Yes Ronny Bacon, MD  warfarin (COUMADIN) 5 MG tablet Take by mouth. 03/26/21  Yes [provider]  sucralfate (CARAFATE) 1 g tablet Take 1 tablet (1 g total) by mouth 4 (four) times daily -  with meals and at bedtime for 3 days. 08/26/21 11/02/21  Lucrezia Starch, MD    Allergies: Allergies  Allergen Reactions   Metronidazole Other (See Comments)    Other reaction(s): Other Mouth sores Mouth sores Mouth sores    Omeprazole Other (See Comments)    Other reaction(s): Arthralgias (intolerance) Other reaction(s): Other    Bactrim [Sulfamethoxazole-Trimethoprim] Nausea Only   Codeine Nausea And Vomiting and Nausea Only    Other reaction(s): Nausea Only But tolerates tramadol     Review of Systems: Review of Systems  Constitutional:  Negative for chills and fever.  Respiratory:  Negative for shortness of breath.   Cardiovascular:  Negative for chest pain.  Gastrointestinal:  Positive for constipation. Negative for abdominal pain, nausea and vomiting.   Physical Exam BP (!) 151/90    Pulse 90    Temp 98.2 F (36.8 C) (Oral)    Ht 5\' 3"  (1.6 m)    Wt 93 lb 6.4 oz (42.4 kg)    SpO2 93%    BMI 16.55 kg/m  CONSTITUTIONAL: No acute distress HEENT:  Normocephalic, atraumatic, extraocular motion intact. RESPIRATORY:  Normal respiratory effort without pathologic use of accessory muscles. CARDIOVASCULAR: Regular rhythm and rate. GI: The abdomen is soft, nondistended, nontender to palpation.  Midline incision is well-healed without any evidence of hernia.  NEUROLOGIC:  Motor and sensation is grossly normal.  Cranial nerves are grossly intact. PSYCH:  Alert and oriented to person, place and  time. Affect is normal.  Labs/Imaging: CT scan chest/abdomen/pelvis on 11/08/2021: IMPRESSION: 1. No evidence of metastatic disease in the abdomen or pelvis. 2. Small bilateral pleural effusions with associated atelectasis, new compared to prior exam. 3. Moderate stool burden. 4.  Aortic Atherosclerosis (ICD10-I70.0).  Assessment and Plan: This is a 87 y.o. female with history of right colon cancer.  - Patient is doing well and her exam is reassuring.  There is no evidence of metastatic disease on her CT scan.  Discussed with her medications for treatment of constipation including prune juice that she has tried before as well as MiraLAX. - Discussed with her that she can start increasing protein in her diet to try to gain more weight.  Discussed with her that is not uncommon that even though its been 8 months since her surgery, that her body is still in the healing process and not  gaining many calories yet.  This will continue to improve. - Follow-up in 4 months.  I spent 15 minutes dedicated to the care of this patient on the date of this encounter to include pre-visit review of records, face-to-face time with the patient discussing diagnosis and management, and any post-visit coordination of care.   [name], [facility]

## 2021-11-12 ENCOUNTER — Other Ambulatory Visit: Payer: Medicare Other

## 2021-11-12 ENCOUNTER — Ambulatory Visit: Payer: Medicare Other | Admitting: Oncology

## 2021-11-17 ENCOUNTER — Other Ambulatory Visit: Payer: Medicare Other

## 2021-11-17 ENCOUNTER — Ambulatory Visit: Payer: Medicare Other | Admitting: Oncology

## 2021-12-09 ENCOUNTER — Ambulatory Visit: Payer: Medicare Other | Admitting: Dermatology

## 2022-01-26 ENCOUNTER — Other Ambulatory Visit: Payer: Self-pay | Admitting: *Deleted

## 2022-01-26 DIAGNOSIS — D509 Iron deficiency anemia, unspecified: Secondary | ICD-10-CM

## 2022-01-31 ENCOUNTER — Other Ambulatory Visit: Payer: Self-pay

## 2022-01-31 ENCOUNTER — Inpatient Hospital Stay: Payer: Medicare Other | Attending: Oncology

## 2022-01-31 DIAGNOSIS — Z08 Encounter for follow-up examination after completed treatment for malignant neoplasm: Secondary | ICD-10-CM

## 2022-01-31 DIAGNOSIS — C182 Malignant neoplasm of ascending colon: Secondary | ICD-10-CM | POA: Insufficient documentation

## 2022-01-31 DIAGNOSIS — D509 Iron deficiency anemia, unspecified: Secondary | ICD-10-CM | POA: Diagnosis not present

## 2022-01-31 DIAGNOSIS — Z85038 Personal history of other malignant neoplasm of large intestine: Secondary | ICD-10-CM

## 2022-01-31 LAB — CBC WITH DIFFERENTIAL/PLATELET
Abs Immature Granulocytes: 0.03 10*3/uL (ref 0.00–0.07)
Basophils Absolute: 0 10*3/uL (ref 0.0–0.1)
Basophils Relative: 1 %
Eosinophils Absolute: 0 10*3/uL (ref 0.0–0.5)
Eosinophils Relative: 1 %
HCT: 39.1 % (ref 36.0–46.0)
Hemoglobin: 12.2 g/dL (ref 12.0–15.0)
Immature Granulocytes: 1 %
Lymphocytes Relative: 16 %
Lymphs Abs: 0.9 10*3/uL (ref 0.7–4.0)
MCH: 30.7 pg (ref 26.0–34.0)
MCHC: 31.2 g/dL (ref 30.0–36.0)
MCV: 98.2 fL (ref 80.0–100.0)
Monocytes Absolute: 0.3 10*3/uL (ref 0.1–1.0)
Monocytes Relative: 5 %
Neutro Abs: 4.4 10*3/uL (ref 1.7–7.7)
Neutrophils Relative %: 76 %
Platelets: 217 10*3/uL (ref 150–400)
RBC: 3.98 MIL/uL (ref 3.87–5.11)
RDW: 15.6 % — ABNORMAL HIGH (ref 11.5–15.5)
WBC: 5.7 10*3/uL (ref 4.0–10.5)
nRBC: 0 % (ref 0.0–0.2)

## 2022-01-31 LAB — FERRITIN: Ferritin: 25 ng/mL (ref 11–307)

## 2022-01-31 LAB — IRON AND TIBC
Iron: 98 ug/dL (ref 28–170)
Saturation Ratios: 27 % (ref 10.4–31.8)
TIBC: 365 ug/dL (ref 250–450)
UIBC: 267 ug/dL

## 2022-02-01 LAB — CEA: CEA: 1.7 ng/mL (ref 0.0–4.7)

## 2022-02-02 ENCOUNTER — Other Ambulatory Visit: Payer: Self-pay

## 2022-02-02 ENCOUNTER — Ambulatory Visit: Payer: Medicare Other | Attending: Internal Medicine

## 2022-02-02 DIAGNOSIS — M6281 Muscle weakness (generalized): Secondary | ICD-10-CM | POA: Insufficient documentation

## 2022-02-02 DIAGNOSIS — M5459 Other low back pain: Secondary | ICD-10-CM | POA: Insufficient documentation

## 2022-02-02 DIAGNOSIS — R262 Difficulty in walking, not elsewhere classified: Secondary | ICD-10-CM | POA: Insufficient documentation

## 2022-02-02 DIAGNOSIS — R2681 Unsteadiness on feet: Secondary | ICD-10-CM | POA: Insufficient documentation

## 2022-02-02 NOTE — Therapy (Signed)
[facility] [address]. [place], [place], [address] Phone: [phone]   Fax:  [phone]  Physical Therapy Evaluation  Patient Details  Name: Kristina Rowe MRN: [id number] Date of Birth: 1929-12-16 Referring Provider (PT): Dr. Tressia Miners   Encounter Date: 02/02/2022   PT End of Session - 02/03/22 1333     Visit Number 1    Number of Visits 17    Date for PT Re-Evaluation 03/30/22    Authorization Type eval: 02/02/22    PT Start Time 1400    PT Stop Time 1445    PT Time Calculation (min) 45 min    Equipment Utilized During Treatment Gait belt    Activity Tolerance Patient tolerated treatment well    Behavior During Therapy [facility] for tasks assessed/performed             Past Medical History:  Diagnosis Date   Ductal carcinoma in situ (DCIS) of left breast    Dysphagia    GERD (gastroesophageal reflux disease)    Hiatal hernia    Hypertension    Iron deficiency anemia    Osteoporosis    Pulmonary embolism ([place])    Scoliosis    UTI (urinary tract infection)     Past Surgical History:  Procedure Laterality Date   BREAST LUMPECTOMY Left 2009   Ductal Carcinoma insitu    CATARACT EXTRACTION, BILATERAL     COLONOSCOPY N/A 03/17/2021   Procedure: COLONOSCOPY;  Surgeon: Lesly Rubenstein, MD;  Location: [facility] ENDOSCOPY;  Service: Endoscopy;  Laterality: N/A;   COLOSTOMY REVISION Right 03/18/2021   Procedure: COLON RESECTION RIGHT;  Surgeon: Olean Ree, MD;  Location: [facility] ORS;  Service: General;  Laterality: Right;   LAPAROSCOPIC PARAESOPHAGEAL HERNIA REPAIR  2009   LAPAROTOMY N/A 03/10/2021   Procedure: EXPLORATORY LAPAROTOMY;  Surgeon: Olean Ree, MD;  Location: [facility] ORS;  Service: General;  Laterality: N/A;   TUBAL LIGATION      There were no vitals filed for this visit.    Subjective Assessment - 02/03/22 1052     Subjective Imbalance    Pertinent History Pt underwent "2 major surgies" last April and  reports a slow deterioration in her balance and strength since that time. She is fearful of falling and if she stands for an extended period of time her legs feel weak.  Pt goes to the [facility] for exercise twice/week. No falls in the last 6-12 months. She uses a single point cane in her RUE to help with her balance during ambulation. Patient lives in a single story townhome (one step to enter) independently. She has a walk-in shower with a seat and grab bars. She has chronic L low back pain and uses topical patches to manage. She takes Warfarin so she can only use Tylenol to help with her pain. She was recently started on low-dose Norvasc due to hypertension which has been effective at lowering her blood pressure readings.    Diagnostic tests None reported    Patient Stated Goals Improve balance and decrease risk for falls                [facility] PT Assessment - 02/02/22 1430       Assessment   Medical Diagnosis Dizziness and giddiness, other abnormalities of gait and mobility    Referring Provider (PT) Dr. Tressia Miners    Onset Date/Surgical Date 02/26/21    Hand Dominance Right    Next MD Visit Not reported  Prior Therapy None reported for this issue      Precautions   Precautions Fall      Restrictions   Weight Bearing Restrictions No      Balance Screen   Has the patient fallen in the past 6 months No      [place] residence    Living Arrangements Alone    Available Help at Discharge Family    Type of Home Other(Comment)   [place] to enter    Entrance Stairs-Number of Steps 1    [place] One level      Prior Function   Level of Independence Independent    Vocation Retired    Biomedical scientist Retired Paramedic, sewing, [place]   Overall Cognitive Status Within [place] for tasks assessed      Standardized Balance Assessment   Standardized Balance  Assessment Runner, broadcasting/film/video   Sit to Stand Able to stand  independently using hands    Standing Unsupported Able to stand safely 2 minutes    Sitting with Back Unsupported but Feet Supported on Floor or Stool Able to sit safely and securely 2 minutes    Stand to Sit Uses backs of legs against chair to control descent    Transfers Able to transfer safely, definite need of hands    Standing Unsupported with Eyes Closed Able to stand 10 seconds with supervision    Standing Unsupported with Feet Together Able to place feet together independently but unable to hold for 30 seconds    From Standing, Reach Forward with Outstretched Arm Can reach forward >5 cm safely (2")    From Standing Position, Pick up Object from Floor Able to pick up shoe, needs supervision    From Standing Position, Turn to Look Behind Over each Shoulder Turn sideways only but maintains balance    Turn 360 Degrees Able to turn 360 degrees safely but slowly    Standing Unsupported, Alternately Place Feet on Step/Stool Able to stand independently and safely and complete 8 steps in 20 seconds    Standing Unsupported, One Foot in [place] to take small step independently and hold 30 seconds    Standing on One Leg Tries to lift leg/unable to hold 3 seconds but remains standing independently    Total Score 37               SUBJECTIVE Chief complaint: History: Pt underwent "2 major surgies" last April and reports a slow deterioration in her balance and strength since that time. She is fearful of falling and if she stands for an extended period of time her legs feel weak.  Pt goes to the [facility] for exercise twice/week. No falls in the last 6-12 months. She uses a single point cane in her RUE to help with her balance during ambulation. Patient lives in a single story townhome (one step to enter) independently. She has a walk-in shower with a seat and grab bars. She has chronic L low back pain and  uses topical patches to manage. She takes Warfarin so she can only use Tylenol to help with her pain. She was recently started on low-dose Norvasc due to hypertension which has been effective at lowering her blood pressure readings.  Red flags: history of breast cancer but otherwise red flags are negative(bowel/bladder changes, saddle  paresthesia, personal history of cancer, chills/fever, night sweats, unrelenting pain): Negative Referring Provider: Dr. Tressia Miners Recent changes in overall health/medication: Recently added Norvasc due to high BP Directional pattern for falls: None Prior history of physical therapy for balance: None Follow-up appointment with MD: No, not reported by patient Dominant hand: right Falls in the last 6 months: No  Occupational demands: Retired Office manager: Firefighter, sewing, quilting Goals: Improve balance and decrease fear of fallings;   OBJECTIVE  Musculoskeletal Tremor: Absent Bulk: Decrease muscle bulk noted in BUE/BLE Tone: Normal, no clonus   Posture Forward head/rounded shoulders with severe thoracic kyphosis as well as lateral scoliosis.   Gait Decreased self-selected gait speed with single point cane in RUE. Mild unsteadiness noted during gait. Full gait assessment deferred  Strength R/L 4+/4+ Hip flexion 4+/4+Hip abduction (seated) 4+/4+ Hip adduction (seated) 5/5 Knee extension 5/5 Knee flexion (seated) 4/4 Ankle Dorsiflexion Active bilateral ankle Plantarflexion UE strength grossly WFL    NEUROLOGICAL:  Mental Status Patient is oriented to person, place and time.  Recent memory is intact.  Remote memory is intact.  Attention span and concentration are intact.  Expressive speech is intact.  Patient's fund of knowledge is within normal limits for educational level.   Cranial Nerves Deferred   Sensation Deferred   Reflexes Deferred   FUNCTIONAL OUTCOME MEASURES   Results Comments  BERG 37/56 Fall risk, in need of  intervention  DGI    FGA    TUG 14.8 seconds   5TSTS Unable to perform without UE assist Fall risk, in need of intervention  6 Minute Walk Test    10 Meter Gait Speed Self-selected: 14.7s =  0.68 m/s; Fastest: 9.3s = 1.08 m/s Below normative values for full community ambulation  ABC Scale 69.4% Above cut-off  DHI    If blank = not tested    Modified Clinical Test of Sensory Interaction for Balance    (CTSIB): Deferred            Objective measurements completed on examination: See above findings.                PT Education - 02/03/22 1333     Education Details Plan of care    Person(s) Educated Patient    Methods Explanation    Comprehension Verbalized understanding              PT Short Term Goals - 02/03/22 1336       PT SHORT TERM GOAL #1   Title Pt will be independent with HEP in order to improve strength and balance in order to decrease fall risk and improve function at home.    Time 4    Period Weeks    Status New    Target Date 03/02/22      PT SHORT TERM GOAL #2   Title Pt will improve BERG by at least 3 points in order to demonstrate clinically significant improvement in balance.    Baseline 02/02/22: 37/56    Time 4    Period Weeks    Status New    Target Date 03/02/22               PT Long Term Goals - 02/03/22 1337       PT LONG TERM GOAL #1   Title Pt will improve BERG to at least 43/56 in order to demonstrate clinically significant improvement in balance and decreased risk for falls    Baseline 02/02/22: 37/56    Time  8    Period Weeks    Status New    Target Date 03/30/22      PT LONG TERM GOAL #2   Title Pt will increase self-selected 10MWT by at least 0.13 m/s in order to demonstrate clinically significant improvement in community ambulation.    Baseline 02/02/22: self-selected: 14.7s = 0.68 m/s    Time 8    Period Weeks    Status New    Target Date 03/30/22      PT LONG TERM GOAL #3   Title Pt will be able  to perform sit to stand x 5 from regular height chair without UE use in order to improvement independence with transfers and decrease her risk for falls    Baseline 02/02/22: unable to perform 1 repetition    Time 8    Period Weeks    Status New    Target Date 03/30/22      PT LONG TERM GOAL #4   Title Pt will decrease TUG to below 12 seconds in order to demonstrate decreased fall risk and improved functional mobility    Baseline 02/02/22: 14.8s    Time 8    Period Weeks    Status New    Target Date 03/30/22                    Plan - 02/03/22 1334     Clinical Impression Statement Pt is a pleasant 88 year-old female referred for difficulty with balance. PT examination reveals deficits in balance identified by BERG score of 37/56 and TUG of 14.8s. Her self-selected gait speed indicates that she is at an increased risk for falls. She has significant BLE weakness and is unable to stand from a regular height chair without UE assistance. Pt presents with deficits in strength, gait and balance and will benefit from skilled PT services to address these deficits, improve balance, and decrease risk for falls.    Personal Factors and Comorbidities Age;Comorbidity 3+    Comorbidities anemia, HTN, aortic stenosis, chronic back pain    Examination-Activity Limitations Bathing;Bend;Squat;Stairs;Stand;Transfers    Examination-Participation Restrictions Community Activity;Shop;Meal Prep    Stability/Clinical Decision Making Evolving/Moderate complexity    Clinical Decision Making Moderate    Rehab Potential Good    PT Frequency 2x / week    PT Duration 8 weeks    PT Treatment/Interventions ADLs/Self Care Home Management;Aquatic Therapy;Canalith Repostioning;Cryotherapy;Electrical Stimulation;Moist Heat;Iontophoresis '4mg'$ /ml Dexamethasone;Traction;Ultrasound;Gait training;Stair training;Functional mobility training;Therapeutic activities;Therapeutic exercise;Balance training;Neuromuscular  re-education;Cognitive remediation;Patient/family education;Manual techniques;Passive range of motion;Dry needling;Vestibular;Spinal Manipulations;Joint Manipulations    PT Next Visit Plan Issue HEP, initiate balance and strengthening    PT Home Exercise Plan None currently    Consulted and Agree with Plan of Care Patient             Patient will benefit from skilled therapeutic intervention in order to improve the following deficits and impairments:  Abnormal gait, Decreased balance, Decreased mobility, Pain  Visit Diagnosis: Unsteadiness on feet - Plan: PT plan of care cert/re-cert  Difficulty in walking, not elsewhere classified - Plan: PT plan of care cert/re-cert     Problem List Patient Active Problem List   Diagnosis Date Noted   Basal cell carcinoma of leg 04/01/2021   Coronary atherosclerosis 04/01/2021   History of pulmonary embolism 04/01/2021   Pulmonary nodule 04/01/2021   Malignant neoplasm of colon ([place]) 03/29/2021   Rectal bleeding 03/15/2021   Hypertension    GERD (gastroesophageal reflux disease)    Colonic mass  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[place]) 03/10/2021   Anemia, unspecified 03/06/2021   Bronchiectasis without complication ([place]) 34/19/3790   Iron deficiency anemia 03/27/2020   Moderate aortic stenosis 03/27/2020   Stage 3a chronic kidney disease ([place]) 07/04/2019   Hypnic headache 05/28/2019   Telogen effluvium 04/17/2019   Xerosis cutis 04/17/2019   Essential hypertension 01/14/2019   Cervical radiculopathy 05/04/2018   Osteoarthritis of left glenohumeral joint 05/04/2018   Vitamin B12 deficiency 01/01/2018   Vascular abnormality 09/11/2017   Dyspepsia 03/28/2017   Pulmonary embolism ([place]) 08/16/2016   Dilated pore of [place] 03/23/2015   Retinal drusen of both eyes 08/28/2014   Status post right cataract extraction 07/30/2014   Verruca 03/19/2014   Chronic back pain 11/06/2013   Postmenopausal osteoporosis 06/11/2013   Anticoagulated on Coumadin  04/06/2013   Long term (current) use of anticoagulants 10/18/2012   Preglaucoma 05/10/2012   Presbyopia 05/10/2012   Senile nuclear sclerosis 05/10/2012   Lobular carcinoma in situ of breast 04/13/2012   Closed fracture of ankle 02/27/2012   History of nonmelanoma skin cancer 09/05/2011   Other benign neoplasm of skin of trunk 09/05/2011   Osteoporosis 08/25/2011   Transient alteration of awareness 09/03/2009   Colon adenoma 07/30/2002   Phillips Grout PT, DPT, GCS  Christiona Siddique, PT 02/03/2022, 1:51 PM  [facility] [address] [place], [place], [address] Phone: [phone]   Fax:  [phone]  Name: Kristina Rowe MRN: [id number] Date of Birth: 05-01-1930

## 2022-02-08 ENCOUNTER — Ambulatory Visit: Payer: Medicare Other | Admitting: Physical Therapy

## 2022-02-08 ENCOUNTER — Encounter: Payer: Self-pay | Admitting: Physical Therapy

## 2022-02-08 ENCOUNTER — Other Ambulatory Visit: Payer: Self-pay

## 2022-02-08 DIAGNOSIS — R2681 Unsteadiness on feet: Secondary | ICD-10-CM | POA: Diagnosis not present

## 2022-02-08 DIAGNOSIS — R262 Difficulty in walking, not elsewhere classified: Secondary | ICD-10-CM

## 2022-02-08 NOTE — Therapy (Signed)
[place] [place] [facility] ?[place], [place], [address] ?Phone: [phone]   Fax:  [phone] ? ?Physical Therapy Treatment ? ?Patient Details  ?Name: Kristina Rowe ?MRN: [id number] ?Date of Birth: June 22, 1930 ?Referring Provider (PT): Dr. Tressia Miners ? ? ?Encounter Date: 02/08/2022 ? ? PT End of Session - 02/08/22 1602   ? ? Visit Number 3   ? Number of Visits 17   ? Date for PT Re-Evaluation 03/30/22   ? Authorization Type eval: 02/02/22   ? Progress Note Due on Visit 10   ? PT Start Time 2094   ? PT Stop Time 7096   ? PT Time Calculation (min) 40 min   ? Equipment Utilized During Treatment Gait belt   ? Activity Tolerance Patient tolerated treatment well   ? Behavior During Therapy [facility] for tasks assessed/performed   ? ?  ?  ? ?  ? ? ?Past Medical History:  ?Diagnosis Date  ? Ductal carcinoma in situ (DCIS) of left breast   ? Dysphagia   ? GERD (gastroesophageal reflux disease)   ? Hiatal hernia   ? Hypertension   ? Iron deficiency anemia   ? Osteoporosis   ? Pulmonary embolism ([place])   ? Scoliosis   ? UTI (urinary tract infection)   ? ? ?Past Surgical History:  ?Procedure Laterality Date  ? BREAST LUMPECTOMY Left 2009  ? Ductal Carcinoma insitu   ? CATARACT EXTRACTION, BILATERAL    ? COLONOSCOPY N/A 03/17/2021  ? Procedure: COLONOSCOPY;  Surgeon: Lesly Rubenstein, MD;  Location: [facility] ENDOSCOPY;  Service: Endoscopy;  Laterality: N/A;  ? COLOSTOMY REVISION Right 03/18/2021  ? Procedure: COLON RESECTION RIGHT;  Surgeon: Olean Ree, MD;  Location: [facility] ORS;  Service: General;  Laterality: Right;  ? LAPAROSCOPIC PARAESOPHAGEAL HERNIA REPAIR  2009  ? LAPAROTOMY N/A 03/10/2021  ? Procedure: EXPLORATORY LAPAROTOMY;  Surgeon: Olean Ree, MD;  Location: [facility] ORS;  Service: General;  Laterality: N/A;  ? TUBAL LIGATION    ? ? ?There were no vitals filed for this visit. ? ? Subjective Assessment - 02/08/22 1308   ? ? Subjective Pt reports no significant changes since  last session. Pt reports she has continued weakness since last session.   ? Pertinent History Pt underwent "2 major surgies" last April and reports a slow deterioration in her balance and strength since that time. She is fearful of falling and if she stands for an extended period of time her legs feel weak.  Pt goes to the [facility] for exercise twice/week. No falls in the last 6-12 months. She uses a single point cane in her RUE to help with her balance during ambulation. Patient lives in a single story townhome (one step to enter) independently. She has a walk-in shower with a seat and grab bars. She has chronic L low back pain and uses topical patches to manage. She takes Warfarin so she can only use Tylenol to help with her pain. She was recently started on low-dose Norvasc due to hypertension which has been effective at lowering her blood pressure readings.   ? Diagnostic tests None reported   ? Patient Stated Goals Improve balance and decrease risk for falls   ? Currently in Pain? Yes   ? Pain Score 8    ? Pain Location Back   ? Pain Orientation Left;Posterior   ? Pain Descriptors / Indicators Aching   ? Pain Type Chronic pain   ? Pain Onset More  than a month ago   ? Pain Frequency Constant   ? ?  ?  ? ?  ? ? ?Exercise/Activity Sets/ Reps/Time/ Resistance Assistance Charge type Comments  ?      ?Semitandem 3 x 45 sec  Neuro re-ed   ?Marching 10 x each  Neuro re-ed   ?      ?      ?SLS progression  3 x 30 sec ea   Neuro re-ed   ?LAQ 2x10x5sec  Therex    ?Marching - standing  2x10   therex   ?      ?Standing HS curls  2x10   therex   ?Mini squats  2*10  therex   ?Treatment provided this session ? ? ?Pt educated throughout session about proper posture and technique with exercises. Improved exercise technique, movement at target joints, use of target muscles after min to mod verbal, visual, tactile cues. ?Note: Portions of this document were prepared using Dragon voice recognition software and although  reviewed may contain unintentional dictation errors in syntax, grammar, or spelling. ? ? ? ? ? ? ? ? ? ? ? ? ? ? ? ? ? ? ? ? ? ? ? ? ? ? ? ? ? ? PT Short Term Goals - 02/03/22 1336   ? ?  ? PT SHORT TERM GOAL #1  ? Title Pt will be independent with HEP in order to improve strength and balance in order to decrease fall risk and improve function at home.   ? Time 4   ? Period Weeks   ? Status New   ? Target Date 03/02/22   ?  ? PT SHORT TERM GOAL #2  ? Title Pt will improve BERG by at least 3 points in order to demonstrate clinically significant improvement in balance.   ? Baseline 02/02/22: 37/56   ? Time 4   ? Period Weeks   ? Status New   ? Target Date 03/02/22   ? ?  ?  ? ?  ? ? ? ? PT Long Term Goals - 02/03/22 1337   ? ?  ? PT LONG TERM GOAL #1  ? Title Pt will improve BERG to at least 43/56 in order to demonstrate clinically significant improvement in balance and decreased risk for falls   ? Baseline 02/02/22: 37/56   ? Time 8   ? Period Weeks   ? Status New   ? Target Date 03/30/22   ?  ? PT LONG TERM GOAL #2  ? Title Pt will increase self-selected 10MWT by at least 0.13 m/s in order to demonstrate clinically significant improvement in community ambulation.   ? Baseline 02/02/22: self-selected: 14.7s = 0.68 m/s   ? Time 8   ? Period Weeks   ? Status New   ? Target Date 03/30/22   ?  ? PT LONG TERM GOAL #3  ? Title Pt will be able to perform sit to stand x 5 from regular height chair without UE use in order to improvement independence with transfers and decrease her risk for falls   ? Baseline 02/02/22: unable to perform 1 repetition   ? Time 8   ? Period Weeks   ? Status New   ? Target Date 03/30/22   ?  ? PT LONG TERM GOAL #4  ? Title Pt will decrease TUG to below 12 seconds in order to demonstrate decreased fall risk and improved functional mobility   ? Baseline 02/02/22: 14.8s   ?  Time 8   ? Period Weeks   ? Status New   ? Target Date 03/30/22   ? ?  ?  ? ?  ? ? ? ? ? ? ? ? Plan - 02/08/22 1313   ? ? Clinical  Impression Statement Patient presents with excellent motivation for completion of physical therapy program.  Patient was introduced to several physical therapy home exercise interventions and demonstrated good understanding and demonstration of these exercises in the session.  He was also introduced to some dynamic balance activities and tolerated these well.  Patient is having some pain in her left side of her low back secondary to scoliosis this may be impacting her balance this was painful with some balance activities.  We will continue to benefit from physical therapy intervention in order to improve her balance, decreased risk of falls, improve overall quality of life and mobility.   ? Personal Factors and Comorbidities Age;Comorbidity 3+   ? Comorbidities anemia, HTN, aortic stenosis, chronic back pain   ? Examination-Activity Limitations Bathing;Bend;Squat;Stairs;Stand;Transfers   ? Examination-Participation Restrictions Community Activity;Shop;Meal Prep   ? Stability/Clinical Decision Making Evolving/Moderate complexity   ? Rehab Potential Good   ? PT Frequency 2x / week   ? PT Duration 8 weeks   ? PT Treatment/Interventions ADLs/Self Care Home Management;Aquatic Therapy;Canalith Repostioning;Cryotherapy;Electrical Stimulation;Moist Heat;Iontophoresis '4mg'$ /ml Dexamethasone;Traction;Ultrasound;Gait training;Stair training;Functional mobility training;Therapeutic activities;Therapeutic exercise;Balance training;Neuromuscular re-education;Cognitive remediation;Patient/family education;Manual techniques;Passive range of motion;Dry needling;Vestibular;Spinal Manipulations;Joint Manipulations   ? PT Next Visit Plan Issue HEP, initiate balance and strengthening   ? PT Home Exercise Plan None currently   ? Consulted and Agree with Plan of Care Patient   ? ?  ?  ? ?  ? ? ?Patient will benefit from skilled therapeutic intervention in order to improve the following deficits and impairments:  Abnormal gait, Decreased  balance, Decreased mobility, Pain ? ?Visit Diagnosis: ?Unsteadiness on feet ? ?Difficulty in walking, not elsewhere classified ? ? ? ? ?Problem List ?Patient Active Problem List  ? Diagnosis Date Noted  ? Basal cell

## 2022-02-08 NOTE — Patient Instructions (Signed)
?  Visit ? [email or web address] ? ?Access Code: [email or web address] ?

## 2022-02-10 ENCOUNTER — Ambulatory Visit: Payer: Medicare Other | Admitting: Physical Therapy

## 2022-02-10 ENCOUNTER — Other Ambulatory Visit: Payer: Self-pay

## 2022-02-10 DIAGNOSIS — R2681 Unsteadiness on feet: Secondary | ICD-10-CM | POA: Diagnosis not present

## 2022-02-10 DIAGNOSIS — R262 Difficulty in walking, not elsewhere classified: Secondary | ICD-10-CM

## 2022-02-10 NOTE — Therapy (Signed)
[place] ?[place], [place], [address] ?Phone: [phone]   Fax:  [phone] ? ?Physical Therapy Treatment ? ?Patient Details  ?Name: Kristina Rowe ?MRN: [id number] ?Date of Birth: Sep 09, 1930 ?Referring Provider (PT): Dr. Tressia Miners ? ? ?Encounter Date: 02/10/2022 ? ? PT End of Session - 02/10/22 1437   ? ? Visit Number 4   ? Number of Visits 17   ? Date for PT Re-Evaluation 03/30/22   ? Authorization Type eval: 02/02/22   ? Progress Note Due on Visit 10   ? PT Start Time 1434   ? PT Stop Time 1515   ? PT Time Calculation (min) 41 min   ? Equipment Utilized During Treatment Gait belt   ? Activity Tolerance Patient tolerated treatment well   ? Behavior During Therapy [facility] for tasks assessed/performed   ? ?  ?  ? ?  ? ? ?Past Medical History:  ?Diagnosis Date  ? Ductal carcinoma in situ (DCIS) of left breast   ? Dysphagia   ? GERD (gastroesophageal reflux disease)   ? Hiatal hernia   ? Hypertension   ? Iron deficiency anemia   ? Osteoporosis   ? Pulmonary embolism ([place])   ? Scoliosis   ? UTI (urinary tract infection)   ? ? ?Past Surgical History:  ?Procedure Laterality Date  ? BREAST LUMPECTOMY Left 2009  ? Ductal Carcinoma insitu   ? CATARACT EXTRACTION, BILATERAL    ? COLONOSCOPY N/A 03/17/2021  ? Procedure: COLONOSCOPY;  Surgeon: Lesly Rubenstein, MD;  Location: [facility] ENDOSCOPY;  Service: Endoscopy;  Laterality: N/A;  ? COLOSTOMY REVISION Right 03/18/2021  ? Procedure: COLON RESECTION RIGHT;  Surgeon: Olean Ree, MD;  Location: [facility] ORS;  Service: General;  Laterality: Right;  ? LAPAROSCOPIC PARAESOPHAGEAL HERNIA REPAIR  2009  ? LAPAROTOMY N/A 03/10/2021  ? Procedure: EXPLORATORY LAPAROTOMY;  Surgeon: Olean Ree, MD;  Location: [facility] ORS;  Service: General;  Laterality: N/A;  ? TUBAL LIGATION    ? ? ?There were no vitals filed for this visit. ? ? Subjective Assessment - 02/10/22 1436   ? ? Subjective Pt reports some moderate soreness in her  muscles following last session but she was able to complete her HEP regardless. Pt brought walker in transport chair to attempt walk from parking area/ [place] to clinic as she has been using transport chair but thinks she would be able to make the walk.   ? Pertinent History Pt underwent "2 major surgies" last April and reports a slow deterioration in her balance and strength since that time. She is fearful of falling and if she stands for an extended period of time her legs feel weak.  Pt goes to the [facility] for exercise twice/week. No falls in the last 6-12 months. She uses a single point cane in her RUE to help with her balance during ambulation. Patient lives in a single story townhome (one step to enter) independently. She has a walk-in shower with a seat and grab bars. She has chronic L low back pain and uses topical patches to manage. She takes Warfarin so she can only use Tylenol to help with her pain. She was recently started on low-dose Norvasc due to hypertension which has been effective at lowering her blood pressure readings.   ? Diagnostic tests None reported   ? Patient Stated Goals Improve balance and decrease risk for falls   ? Currently in Pain? No/denies   ? Pain Onset  More than a month ago   ? ?  ?  ? ?  ? ? ? ? ?Exercise/Activity Sets/ Reps/Time/ Resistance Assistance Charge type Comments  ?Ball roll outs  3 way x 5 sec ea   There ex  To improve lower body tightness and mobility prior to LE strenght and balance interventions   ?Semitandem 3 x 45 sec CGA Neuro re-ed Cues for widen base of support to improve balance this activity as well as with activities requiring balance in general.  ?Walking with rolling walker from clinic to [place] parking area approximately 250 feet Standby assist in case of fatigue with transport chair pushed by physical therapy Therapeutic exercise Patient able to successfully make trip without fatigue.  Patient reports confidence with making this trip in the future  without need for transport chair as this will improve her overall mobility and confidence and endurance.  ?      ?      ?SLS progression - 1 LE on floor, 1 LE on 6 inch step  3 x 30 sec ea   Neuro re-ed Cues for proper lower extremity positioning  ?LAQ 2 x 8 x 5 sec x 2.5# AW   Therex  Cues for hold times  ?Marching - standing  2x10   therex No UE assistance, cues for slow and controlled movements.  With increased practice patient able to obtain higher marches with better balance and control  ?      ?Rocking on 1/2 foam roller  2 x 12  Therex  Cues for proper performance without hip compensatory movements  ?      ?Treatment provided this session ? ? ?Pt educated throughout session about proper posture and technique with exercises. Improved exercise technique, movement at target joints, use of target muscles after min to mod verbal, visual, tactile cues. ?Note: Portions of this document were prepared using Dragon voice recognition software and although reviewed may contain unintentional dictation errors in syntax, grammar, or spelling. ? ? ? ? ? ? ? ? ? ? ? ? ? ? ? ? ? ? ? ? ? ? ? ? ? ? ? ? ? ? PT Education - 02/11/22 0807   ? ? Education Details Exercise form, technique, purpose   ? Person(s) Educated Patient   ? Methods Explanation;Demonstration;Tactile cues   ? Comprehension Verbalized understanding;Returned demonstration;Verbal cues required   ? ?  ?  ? ?  ? ? ? PT Short Term Goals - 02/03/22 1336   ? ?  ? PT SHORT TERM GOAL #1  ? Title Pt will be independent with HEP in order to improve strength and balance in order to decrease fall risk and improve function at home.   ? Time 4   ? Period Weeks   ? Status New   ? Target Date 03/02/22   ?  ? PT SHORT TERM GOAL #2  ? Title Pt will improve BERG by at least 3 points in order to demonstrate clinically significant improvement in balance.   ? Baseline 02/02/22: 37/56   ? Time 4   ? Period Weeks   ? Status New   ? Target Date 03/02/22   ? ?  ?  ? ?  ? ? ? ? PT Long Term  Goals - 02/03/22 1337   ? ?  ? PT LONG TERM GOAL #1  ? Title Pt will improve BERG to at least 43/56 in order to demonstrate clinically significant improvement in balance and decreased risk  for falls   ? Baseline 02/02/22: 37/56   ? Time 8   ? Period Weeks   ? Status New   ? Target Date 03/30/22   ?  ? PT LONG TERM GOAL #2  ? Title Pt will increase self-selected 10MWT by at least 0.13 m/s in order to demonstrate clinically significant improvement in community ambulation.   ? Baseline 02/02/22: self-selected: 14.7s = 0.68 m/s   ? Time 8   ? Period Weeks   ? Status New   ? Target Date 03/30/22   ?  ? PT LONG TERM GOAL #3  ? Title Pt will be able to perform sit to stand x 5 from regular height chair without UE use in order to improvement independence with transfers and decrease her risk for falls   ? Baseline 02/02/22: unable to perform 1 repetition   ? Time 8   ? Period Weeks   ? Status New   ? Target Date 03/30/22   ?  ? PT LONG TERM GOAL #4  ? Title Pt will decrease TUG to below 12 seconds in order to demonstrate decreased fall risk and improved functional mobility   ? Baseline 02/02/22: 14.8s   ? Time 8   ? Period Weeks   ? Status New   ? Target Date 03/30/22   ? ?  ?  ? ?  ? ? ? ? ? ? ? ? Plan - 02/11/22 0807   ? ? Clinical Impression Statement Patient presents to physical therapy with excellent motivation for completion of physical therapy program.  Patient has been completing home exercise program regularly over the past day and had few questions regarding home exercise program.  All questions were answered and patient will follow-up next session with completion of exercise with minimal initial cues from therapist in order to ensure proper form with mini squat activity.  Patient progressed with several dynamic balance and strengthening activities this session with good response and minimal need for rest throughout.  Patient also trialed with walking from clinic waiting area to [place] parking with standby assist from  physical therapist and with transport chair on hand in case patient fatigued.  Patient was able to make the walk safely and did not have significant fatigue with this activity.  Patient will continue to be

## 2022-02-11 ENCOUNTER — Encounter: Payer: Self-pay | Admitting: Physical Therapy

## 2022-02-15 ENCOUNTER — Ambulatory Visit: Payer: Medicare Other

## 2022-02-17 ENCOUNTER — Other Ambulatory Visit: Payer: Self-pay

## 2022-02-17 ENCOUNTER — Encounter: Payer: Self-pay | Admitting: Physical Therapy

## 2022-02-17 ENCOUNTER — Ambulatory Visit: Payer: Medicare Other | Admitting: Physical Therapy

## 2022-02-17 DIAGNOSIS — R2681 Unsteadiness on feet: Secondary | ICD-10-CM | POA: Diagnosis not present

## 2022-02-17 DIAGNOSIS — R262 Difficulty in walking, not elsewhere classified: Secondary | ICD-10-CM

## 2022-02-17 NOTE — Therapy (Signed)
[facility] ?[place], [place], [address] ?Phone: [phone]   Fax:  [phone] ? ?Physical Therapy Treatment ? ?Patient Details  ?Name: Kristina Rowe ?MRN: [id number] ?Date of Birth: Apr 03, 1930 ?Referring Provider (PT): Dr. Tressia Miners ? ? ?Encounter Date: 02/17/2022 ? ? PT End of Session - 02/17/22 1352   ? ? Visit Number 5   ? Number of Visits 17   ? Date for PT Re-Evaluation 03/30/22   ? Authorization Type eval: 02/02/22   ? Progress Note Due on Visit 10   ? PT Start Time 1345   ? PT Stop Time 6440   ? PT Time Calculation (min) 44 min   ? Equipment Utilized During Treatment Gait belt   ? Activity Tolerance Patient tolerated treatment well   ? Behavior During Therapy [facility] for tasks assessed/performed   ? ?  ?  ? ?  ? ? ?Past Medical History:  ?Diagnosis Date  ? Ductal carcinoma in situ (DCIS) of left breast   ? Dysphagia   ? GERD (gastroesophageal reflux disease)   ? Hiatal hernia   ? Hypertension   ? Iron deficiency anemia   ? Osteoporosis   ? Pulmonary embolism ([place])   ? Scoliosis   ? UTI (urinary tract infection)   ? ? ?Past Surgical History:  ?Procedure Laterality Date  ? BREAST LUMPECTOMY Left 2009  ? Ductal Carcinoma insitu   ? CATARACT EXTRACTION, BILATERAL    ? COLONOSCOPY N/A 03/17/2021  ? Procedure: COLONOSCOPY;  Surgeon: Lesly Rubenstein, MD;  Location: [facility] ENDOSCOPY;  Service: Endoscopy;  Laterality: N/A;  ? COLOSTOMY REVISION Right 03/18/2021  ? Procedure: COLON RESECTION RIGHT;  Surgeon: Olean Ree, MD;  Location: [facility] ORS;  Service: General;  Laterality: Right;  ? LAPAROSCOPIC PARAESOPHAGEAL HERNIA REPAIR  2009  ? LAPAROTOMY N/A 03/10/2021  ? Procedure: EXPLORATORY LAPAROTOMY;  Surgeon: Olean Ree, MD;  Location: [facility] ORS;  Service: General;  Laterality: N/A;  ? TUBAL LIGATION    ? ? ?There were no vitals filed for this visit. ? ? Subjective Assessment - 02/17/22 1348   ? ? Subjective Pt reports some improvement in back pain  since beginning with ball roll out activities at home. Also reports having bladder infection over the past several days and difficulties with this but it is improving since starting on the medicine.   ? Pertinent History Pt underwent "2 major surgies" last April and reports a slow deterioration in her balance and strength since that time. She is fearful of falling and if she stands for an extended period of time her legs feel weak.  Pt goes to the [facility] for exercise twice/week. No falls in the last 6-12 months. She uses a single point cane in her RUE to help with her balance during ambulation. Patient lives in a single story townhome (one step to enter) independently. She has a walk-in shower with a seat and grab bars. She has chronic L low back pain and uses topical patches to manage. She takes Warfarin so she can only use Tylenol to help with her pain. She was recently started on low-dose Norvasc due to hypertension which has been effective at lowering her blood pressure readings.   ? Diagnostic tests None reported   ? Patient Stated Goals Improve balance and decrease risk for falls   ? Pain Onset More than a month ago   ? ?  ?  ? ?  ? ? ? ? ?Exercise/Activity  Sets/ Reps/Time/ Resistance Assistance Charge type Comments Unless otherwise stated, CGA was provided and gait belt donned in order to ensure pt safety ?  ?Ball roll outs  3 way x 5 sec ea   There ex  To improve lower body tightness and mobility prior to LE strenght and balance interventions   ?Semitandem* 3 x 45 sec CGA Neuro re-ed Cues for widen base of support to improve balance this activity as well as with activities requiring balance in general. ?*Pain noted in back with R LE posterior  ?SLS progression 1 LE on floor 1 on step laterally  2*30 sec ea    Min pain in low back with R LE on floor but pt said " not too bad"   ?STS standard chair with airex pad  2 x 6 cga  Hands on knees, no LOB noted, improved with efficacy   ?NBOS on airex   ?Normal base of support on Airex with ball handoff laterally 2 x 45 sec ea ?*5ea side   CGA, intermittent UE support ( every 5-8 sec)  Unable to maintain this position for prolonged period.  When adding ball handoff patient was able to maintain positioning but had lower extremities and more abducted stance with wider base of support  ?SLS progression - 1 LE on floor, 1 LE on 6 inch step  3 x 30 sec ea   Neuro re-ed Cues for proper lower extremity positioning  ?LAQ 2 x 10 x 2 sec x 3# AW   Therex  Cues for hold times  ?Marching - standing  2x10   therex No UE assistance, cues for slow and controlled movements.  With increased practice patient able to obtain higher marches with better balance and control  ?      ?Rocking on 1/2 foam roller  2 x 12  Therex  Cues for proper performance without hip compensatory movements  ?      ?Treatment provided this session ? ? ?Pt educated throughout session about proper posture and technique with exercises. Improved exercise technique, movement at target joints, use of target muscles after min to mod verbal, visual, tactile cues. ?Note: Portions of this document were prepared using Dragon voice recognition software and although reviewed may contain unintentional dictation errors in syntax, grammar, or spelling. ? ?Pt required occasional rest breaks due fatigue, PT was quick to ask when pt appeared to be fatiguing in order to prevent excessive fatigue. ? ? ? ? ? ? ? ? ? ? ? ? ? ? ? ? ? ? ? ? ? ? ? ? ? ? PT Education - 02/17/22 1351   ? ? Education Details exercise form and technique   ? Person(s) Educated Patient   ? Methods Explanation;Demonstration   ? Comprehension Verbalized understanding;Returned demonstration   ? ?  ?  ? ?  ? ? ? PT Short Term Goals - 02/03/22 1336   ? ?  ? PT SHORT TERM GOAL #1  ? Title Pt will be independent with HEP in order to improve strength and balance in order to decrease fall risk and improve function at home.   ? Time 4   ? Period Weeks   ? Status  New   ? Target Date 03/02/22   ?  ? PT SHORT TERM GOAL #2  ? Title Pt will improve BERG by at least 3 points in order to demonstrate clinically significant improvement in balance.   ? Baseline 02/02/22: 37/56   ? Time 4   ?  Period Weeks   ? Status New   ? Target Date 03/02/22   ? ?  ?  ? ?  ? ? ? ? PT Long Term Goals - 02/03/22 1337   ? ?  ? PT LONG TERM GOAL #1  ? Title Pt will improve BERG to at least 43/56 in order to demonstrate clinically significant improvement in balance and decreased risk for falls   ? Baseline 02/02/22: 37/56   ? Time 8   ? Period Weeks   ? Status New   ? Target Date 03/30/22   ?  ? PT LONG TERM GOAL #2  ? Title Pt will increase self-selected 10MWT by at least 0.13 m/s in order to demonstrate clinically significant improvement in community ambulation.   ? Baseline 02/02/22: self-selected: 14.7s = 0.68 m/s   ? Time 8   ? Period Weeks   ? Status New   ? Target Date 03/30/22   ?  ? PT LONG TERM GOAL #3  ? Title Pt will be able to perform sit to stand x 5 from regular height chair without UE use in order to improvement independence with transfers and decrease her risk for falls   ? Baseline 02/02/22: unable to perform 1 repetition   ? Time 8   ? Period Weeks   ? Status New   ? Target Date 03/30/22   ?  ? PT LONG TERM GOAL #4  ? Title Pt will decrease TUG to below 12 seconds in order to demonstrate decreased fall risk and improved functional mobility   ? Baseline 02/02/22: 14.8s   ? Time 8   ? Period Weeks   ? Status New   ? Target Date 03/30/22   ? ?  ?  ? ?  ? ? ? ? ? ? ? ? Plan - 02/17/22 1618   ? ? Clinical Impression Statement Patient demonstrates excellent motivation for completion of physical therapy program.  Patient reports completing ball roll outs at home has been improving her back pain slightly and also attributes some improvement in her pain to the recent weather changes.  Patient demonstrated good form when asked to perform mini squats as part of home exercise program.  Progressed with  several exercises including sit to stand from standard chair with Airex pad and patient tolerated well but did have increased fatigue with this task.  Patient does have some increase in pain performing activities

## 2022-02-22 ENCOUNTER — Other Ambulatory Visit: Payer: Self-pay

## 2022-02-22 ENCOUNTER — Ambulatory Visit: Payer: Medicare Other | Admitting: Physical Therapy

## 2022-02-22 DIAGNOSIS — R2681 Unsteadiness on feet: Secondary | ICD-10-CM

## 2022-02-22 DIAGNOSIS — R262 Difficulty in walking, not elsewhere classified: Secondary | ICD-10-CM

## 2022-02-22 NOTE — Therapy (Signed)
[facility] ?[place], [place], [address] ?Phone: [phone]   Fax:  [phone] ? ?Physical Therapy Treatment ? ?Patient Details  ?Name: Kristina Rowe ?MRN: [id number] ?Date of Birth: Jun 23, 1930 ?Referring Provider (PT): Dr. Tressia Miners ? ? ?Encounter Date: 02/22/2022 ? ? PT End of Session - 02/22/22 1556   ? ? Visit Number 6   ? Number of Visits 17   ? Date for PT Re-Evaluation 03/30/22   ? Authorization Type eval: 02/02/22   ? Progress Note Due on Visit 10   ? PT Start Time 1600   ? PT Stop Time 9518   ? PT Time Calculation (min) 44 min   ? Equipment Utilized During Treatment Gait belt   ? Activity Tolerance Patient tolerated treatment well   ? Behavior During Therapy [facility] for tasks assessed/performed   ? ?  ?  ? ?  ? ? ?Past Medical History:  ?Diagnosis Date  ? Ductal carcinoma in situ (DCIS) of left breast   ? Dysphagia   ? GERD (gastroesophageal reflux disease)   ? Hiatal hernia   ? Hypertension   ? Iron deficiency anemia   ? Osteoporosis   ? Pulmonary embolism ([place])   ? Scoliosis   ? UTI (urinary tract infection)   ? ? ?Past Surgical History:  ?Procedure Laterality Date  ? BREAST LUMPECTOMY Left 2009  ? Ductal Carcinoma insitu   ? CATARACT EXTRACTION, BILATERAL    ? COLONOSCOPY N/A 03/17/2021  ? Procedure: COLONOSCOPY;  Surgeon: Lesly Rubenstein, MD;  Location: [facility] ENDOSCOPY;  Service: Endoscopy;  Laterality: N/A;  ? COLOSTOMY REVISION Right 03/18/2021  ? Procedure: COLON RESECTION RIGHT;  Surgeon: Olean Ree, MD;  Location: [facility] ORS;  Service: General;  Laterality: Right;  ? LAPAROSCOPIC PARAESOPHAGEAL HERNIA REPAIR  2009  ? LAPAROTOMY N/A 03/10/2021  ? Procedure: EXPLORATORY LAPAROTOMY;  Surgeon: Olean Ree, MD;  Location: [facility] ORS;  Service: General;  Laterality: N/A;  ? TUBAL LIGATION    ? ? ?There were no vitals filed for this visit. ? ? Subjective Assessment - 02/22/22 1556   ? ? Subjective Pt reports some improvement in back pain  since beginning with ball roll out activities at home. Also reports having bladder infection over the past several days and difficulties with this but it is improving since starting on the medicine.   ? Pertinent History Pt underwent "2 major surgies" last April and reports a slow deterioration in her balance and strength since that time. She is fearful of falling and if she stands for an extended period of time her legs feel weak.  Pt goes to the [facility] for exercise twice/week. No falls in the last 6-12 months. She uses a single point cane in her RUE to help with her balance during ambulation. Patient lives in a single story townhome (one step to enter) independently. She has a walk-in shower with a seat and grab bars. She has chronic L low back pain and uses topical patches to manage. She takes Warfarin so she can only use Tylenol to help with her pain. She was recently started on low-dose Norvasc due to hypertension which has been effective at lowering her blood pressure readings.   ? Diagnostic tests None reported   ? Patient Stated Goals Improve balance and decrease risk for falls   ? Currently in Pain? Yes   ? Pain Location Back   ? Pain Orientation Posterior;Left   ? Pain Descriptors /  Indicators Aching   ? Pain Onset More than a month ago   ? ?  ?  ? ?  ? ? ?Exercise/Activity Sets/ Reps/Time/ Resistance Assistance Charge type Comments Unless otherwise stated, CGA was provided and gait belt donned in order to ensure pt safety ?  ?Ball roll outs  3 way x 5 sec ea   There ex  To improve lower body tightness and mobility prior to LE strenght and balance interventions   ?      ?      ?STS standard chair with airex pad  2 x 6 cga  Hands on knees, no LOB noted, improved with efficacy   ?      ?Normal BOS airex with head turns  *45  sec ea of normal stance, lateral head turns and vertical head turns  cga Neuro re-ed Cues for proper lower extremity positioning  ?LAQ - alternating second set  2 x 10 x 2 sec x  4# AW   Therex  Cues for hold times  ?Marching - standing  2x10  cga therex No UE assistance, cues for slow and controlled movements.  With increased practice patient able to obtain higher marches with better balance and control  ?Standing balance 1/2 foam roller  2*45 sec  cga NMR  Intermittent upper extremity assist required approximately every 5 to 7 seconds.  ?Rocking on 1/2 foam roller  2 x 12  Therex  Cues for proper performance without hip compensatory movements  ?      ?Treatment provided this session ? ? ?Pt educated throughout session about proper posture and technique with exercises. Improved exercise technique, movement at target joints, use of target muscles after min to mod verbal, visual, tactile cues. ?Note: Portions of this document were prepared using Dragon voice recognition software and although reviewed may contain unintentional dictation errors in syntax, grammar, or spelling. ? ?Pt required occasional rest breaks due fatigue, PT was quick to ask when pt appeared to be fatiguing in order to prevent excessive fatigue. ? ? ? ? ? ? ? ? ? ? ? ? ? ? ? ? ? ? ? ? ? ? ? ? ? ? ? ? ? PT Short Term Goals - 02/03/22 1336   ? ?  ? PT SHORT TERM GOAL #1  ? Title Pt will be independent with HEP in order to improve strength and balance in order to decrease fall risk and improve function at home.   ? Time 4   ? Period Weeks   ? Status New   ? Target Date 03/02/22   ?  ? PT SHORT TERM GOAL #2  ? Title Pt will improve BERG by at least 3 points in order to demonstrate clinically significant improvement in balance.   ? Baseline 02/02/22: 37/56   ? Time 4   ? Period Weeks   ? Status New   ? Target Date 03/02/22   ? ?  ?  ? ?  ? ? ? ? PT Long Term Goals - 02/03/22 1337   ? ?  ? PT LONG TERM GOAL #1  ? Title Pt will improve BERG to at least 43/56 in order to demonstrate clinically significant improvement in balance and decreased risk for falls   ? Baseline 02/02/22: 37/56   ? Time 8   ? Period Weeks   ? Status New   ?  Target Date 03/30/22   ?  ? PT LONG TERM GOAL #2  ? Title  Pt will increase self-selected 10MWT by at least 0.13 m/s in order to demonstrate clinically significant improvement in community ambulation.   ? Baseline 02/02/22: self-selected: 14.7s = 0.68 m/s   ? Time 8   ? Period Weeks   ? Status New   ? Target Date 03/30/22   ?  ? PT LONG TERM GOAL #3  ? Title Pt will be able to perform sit to stand x 5 from regular height chair without UE use in order to improvement independence with transfers and decrease her risk for falls   ? Baseline 02/02/22: unable to perform 1 repetition   ? Time 8   ? Period Weeks   ? Status New   ? Target Date 03/30/22   ?  ? PT LONG TERM GOAL #4  ? Title Pt will decrease TUG to below 12 seconds in order to demonstrate decreased fall risk and improved functional mobility   ? Baseline 02/02/22: 14.8s   ? Time 8   ? Period Weeks   ? Status New   ? Target Date 03/30/22   ? ?  ?  ? ?  ? ? ? ? ? ? ? ? Plan - 02/22/22 1556   ? ? Clinical Impression Statement Patient demonstrates excellent motivation for completion of physical therapy program. Patient reports completing ball roll outs at home has been improving her back pain slightly and also attributes some improvement in her pain to the recent weather changes.  Patient did not have any pain with any balance activities today as we kept her lower extremities in either abducted stance or normal base of support while challenging her balance.  Patient does report improved signs and symptoms of low back pain with the addition of lumbar ball roll outs to her home exercise program.  Patient will continue to benefit from skilled physical therapy intervention in order to improve her balance, decreased risk of falls, improve her mobility, and improve her overall quality of life.   ? Personal Factors and Comorbidities Age;Comorbidity 3+   ? Comorbidities anemia, HTN, aortic stenosis, chronic back pain   ? Examination-Activity Limitations  Bathing;Bend;Squat;Stairs;Stand;Transfers   ? Examination-Participation Restrictions Community Activity;Shop;Meal Prep   ? Stability/Clinical Decision Making Evolving/Moderate complexity   ? Rehab Potential Good   ? PT Frequency

## 2022-02-24 ENCOUNTER — Ambulatory Visit: Payer: Medicare Other

## 2022-02-24 DIAGNOSIS — R2681 Unsteadiness on feet: Secondary | ICD-10-CM | POA: Diagnosis not present

## 2022-02-24 DIAGNOSIS — M5459 Other low back pain: Secondary | ICD-10-CM

## 2022-02-24 DIAGNOSIS — R262 Difficulty in walking, not elsewhere classified: Secondary | ICD-10-CM

## 2022-02-24 DIAGNOSIS — M6281 Muscle weakness (generalized): Secondary | ICD-10-CM

## 2022-02-24 NOTE — Therapy (Signed)
[facility] ?[place], [place], [address] ?Phone: [phone]   Fax:  [phone] ? ?Physical Therapy Treatment ? ?Patient Details  ?Name: Kristina Rowe ?MRN: [id number] ?Date of Birth: 1930-03-25 ?Referring Provider (PT): Dr. Tressia Miners ? ? ?Encounter Date: 02/24/2022 ? ? PT End of Session - 02/24/22 1510   ? ? Visit Number 7   ? Number of Visits 17   ? Date for PT Re-Evaluation 03/30/22   ? Authorization Type eval: 02/02/22   ? Progress Note Due on Visit 10   ? PT Start Time 1509   ? PT Stop Time 8280   ? PT Time Calculation (min) 44 min   ? Equipment Utilized During Treatment Gait belt   ? Activity Tolerance Patient tolerated treatment well   ? Behavior During Therapy [facility] for tasks assessed/performed   ? ?  ?  ? ?  ? ? ?Past Medical History:  ?Diagnosis Date  ? Ductal carcinoma in situ (DCIS) of left breast   ? Dysphagia   ? GERD (gastroesophageal reflux disease)   ? Hiatal hernia   ? Hypertension   ? Iron deficiency anemia   ? Osteoporosis   ? Pulmonary embolism ([place])   ? Scoliosis   ? UTI (urinary tract infection)   ? ? ?Past Surgical History:  ?Procedure Laterality Date  ? BREAST LUMPECTOMY Left 2009  ? Ductal Carcinoma insitu   ? CATARACT EXTRACTION, BILATERAL    ? COLONOSCOPY N/A 03/17/2021  ? Procedure: COLONOSCOPY;  Surgeon: Lesly Rubenstein, MD;  Location: [facility] ENDOSCOPY;  Service: Endoscopy;  Laterality: N/A;  ? COLOSTOMY REVISION Right 03/18/2021  ? Procedure: COLON RESECTION RIGHT;  Surgeon: Olean Ree, MD;  Location: [facility] ORS;  Service: General;  Laterality: Right;  ? LAPAROSCOPIC PARAESOPHAGEAL HERNIA REPAIR  2009  ? LAPAROTOMY N/A 03/10/2021  ? Procedure: EXPLORATORY LAPAROTOMY;  Surgeon: Olean Ree, MD;  Location: [facility] ORS;  Service: General;  Laterality: N/A;  ? TUBAL LIGATION    ? ? ?There were no vitals filed for this visit. ? ? Subjective Assessment - 02/24/22 1509   ? ? Subjective Patient reports that she always has back  pain due to the scoliosis  but believes the ball and other exercises are helping.   ? Pertinent History Pt underwent "2 major surgies" last April and reports a slow deterioration in her balance and strength since that time. She is fearful of falling and if she stands for an extended period of time her legs feel weak.  Pt goes to the [facility] for exercise twice/week. No falls in the last 6-12 months. She uses a single point cane in her RUE to help with her balance during ambulation. Patient lives in a single story townhome (one step to enter) independently. She has a walk-in shower with a seat and grab bars. She has chronic L low back pain and uses topical patches to manage. She takes Warfarin so she can only use Tylenol to help with her pain. She was recently started on low-dose Norvasc due to hypertension which has been effective at lowering her blood pressure readings.   ? Diagnostic tests None reported   ? Patient Stated Goals Improve balance and decrease risk for falls   ? Currently in Pain? Yes   ? Pain Score 5    ? Pain Location Back   ? Pain Orientation Right;Left;Posterior   ? Pain Descriptors / Indicators Aching;Sore   ? Pain Type Chronic pain   ?  Pain Onset More than a month ago   ? Pain Frequency Constant   ? ?  ?  ? ?  ? ? ? ? ?Exercise/Activity Sets/ Reps/Time/ Resistance Assistance Charge type Comments Unless otherwise stated, CGA was provided and gait belt donned in order to ensure pt safety ?  ?Ball roll outs - Lumbar flex bias to Left/Center/Right 3 way x 5 times ea   There ex  To improve lower body tightness and mobility. Patient reported feeling "looser after stretching." ?  ?Hip march with abdominal bracing  10 reps each LE  Therex VC to keep core engaged   ?      ?STS standard chair with airex pad  X10 reps ? ?X 10 more with core engaged CGA Therex  No UE Support , no LOB noted, improved with efficacy   ?Side step over 1/2 foam   X 12 reps CGA Therex  At Support bar- VC to widen steps to  clear foam.   ?LAQ - core engaged 2 x 10  sets CGA Therex  Cues for hold times  ?Step tap onto 1st step without UE support  X 15 reps each LE CGA therex No UE assistance, cues for slow and controlled movements.    ?      ?      ?      ?Treatment provided this session ? ? ?Pt educated throughout session about proper posture and technique with exercises. Improved exercise technique, movement at target joints, use of target muscles after min to mod verbal, visual, tactile cues. ? ? ? ? ? ? ? ? ? ? ? ? ? ? ? ? ? ? ? ? ? ? ? ? ? ? PT Education - 02/24/22 1510   ? ? Education Details Exercise technique   ? Person(s) Educated Patient   ? Methods Explanation;Demonstration;Tactile cues;Verbal cues   ? Comprehension Verbalized understanding;Returned demonstration;Verbal cues required;Tactile cues required;Need further instruction   ? ?  ?  ? ?  ? ? ? PT Short Term Goals - 02/03/22 1336   ? ?  ? PT SHORT TERM GOAL #1  ? Title Pt will be independent with HEP in order to improve strength and balance in order to decrease fall risk and improve function at home.   ? Time 4   ? Period Weeks   ? Status New   ? Target Date 03/02/22   ?  ? PT SHORT TERM GOAL #2  ? Title Pt will improve BERG by at least 3 points in order to demonstrate clinically significant improvement in balance.   ? Baseline 02/02/22: 37/56   ? Time 4   ? Period Weeks   ? Status New   ? Target Date 03/02/22   ? ?  ?  ? ?  ? ? ? ? PT Long Term Goals - 02/03/22 1337   ? ?  ? PT LONG TERM GOAL #1  ? Title Pt will improve BERG to at least 43/56 in order to demonstrate clinically significant improvement in balance and decreased risk for falls   ? Baseline 02/02/22: 37/56   ? Time 8   ? Period Weeks   ? Status New   ? Target Date 03/30/22   ?  ? PT LONG TERM GOAL #2  ? Title Pt will increase self-selected 10MWT by at least 0.13 m/s in order to demonstrate clinically significant improvement in community ambulation.   ? Baseline 02/02/22: self-selected: 14.7s = 0.68 m/s   ?  Time 8    ? Period Weeks   ? Status New   ? Target Date 03/30/22   ?  ? PT LONG TERM GOAL #3  ? Title Pt will be able to perform sit to stand x 5 from regular height chair without UE use in order to improvement independence with transfers and decrease her risk for falls   ? Baseline 02/02/22: unable to perform 1 repetition   ? Time 8   ? Period Weeks   ? Status New   ? Target Date 03/30/22   ?  ? PT LONG TERM GOAL #4  ? Title Pt will decrease TUG to below 12 seconds in order to demonstrate decreased fall risk and improved functional mobility   ? Baseline 02/02/22: 14.8s   ? Time 8   ? Period Weeks   ? Status New   ? Target Date 03/30/22   ? ?  ?  ? ?  ? ? ? ? ? ? ? ? Plan - 02/24/22 1628   ? ? Clinical Impression Statement Patient presented with good motivation for today's session. Patient has chronic LBP with scoliosis and kyphosis yet performed well with low back stretching and later standing balance/strengthening exercises. Added more core stabilization with mobility/exercises with favorable results. Patient was able to verbalize understanding of importance of activating core musculature to decreased risk of back pain. She was able to accept all cues and return demonstration of all tasks well without report of any increased pain. Patient will continue to benefit from skilled physical therapy intervention in order to improve her balance, decreased risk of falls, improve her mobility, and improve her overall quality of life   ? Personal Factors and Comorbidities Age;Comorbidity 3+   ? Comorbidities anemia, HTN, aortic stenosis, chronic back pain   ? Examination-Activity Limitations Bathing;Bend;Squat;Stairs;Stand;Transfers   ? Examination-Participation Restrictions Community Activity;Shop;Meal Prep   ? Stability/Clinical Decision Making Evolving/Moderate complexity   ? Rehab Potential Good   ? PT Frequency 2x / week   ? PT Duration 8 weeks   ? PT Treatment/Interventions ADLs/Self Care Home Management;Aquatic Therapy;Canalith  Repostioning;Cryotherapy;Electrical Stimulation;Moist Heat;Iontophoresis '4mg'$ /ml Dexamethasone;Traction;Ultrasound;Gait training;Stair training;Functional mobility training;Therapeutic activities;[place]ra

## 2022-03-02 ENCOUNTER — Ambulatory Visit: Payer: Medicare Other | Attending: Internal Medicine

## 2022-03-02 DIAGNOSIS — R2681 Unsteadiness on feet: Secondary | ICD-10-CM

## 2022-03-02 DIAGNOSIS — M5459 Other low back pain: Secondary | ICD-10-CM | POA: Diagnosis present

## 2022-03-02 DIAGNOSIS — R2689 Other abnormalities of gait and mobility: Secondary | ICD-10-CM | POA: Diagnosis present

## 2022-03-02 DIAGNOSIS — R262 Difficulty in walking, not elsewhere classified: Secondary | ICD-10-CM | POA: Diagnosis present

## 2022-03-02 DIAGNOSIS — M6281 Muscle weakness (generalized): Secondary | ICD-10-CM | POA: Diagnosis present

## 2022-03-02 DIAGNOSIS — R278 Other lack of coordination: Secondary | ICD-10-CM

## 2022-03-02 NOTE — Therapy (Signed)
[place] [facility] ?[place], [place], [address] ?Phone: [phone]   Fax:  [phone] ? ?Physical Therapy Treatment ? ?Patient Details  ?Name: Kristina Rowe ?MRN: [id number] ?Date of Birth: [birth year] ?Referring Provider (PT): Dr. Tressia Miners ? ? ?Encounter Date: 03/02/2022 ? ? PT End of Session - 03/02/22 1525   ? ? Visit Number 8   ? Number of Visits 17   ? Date for PT Re-Evaluation 03/30/22   ? Authorization Type eval: 02/02/22   ? Progress Note Due on Visit 10   ? PT Start Time 1515   ? PT Stop Time 1600   ? PT Time Calculation (min) 45 min   ? Equipment Utilized During Treatment Gait belt   ? Activity Tolerance Patient tolerated treatment well   ? Behavior During Therapy [facility] for tasks assessed/performed   ? ?  ?  ? ?  ? ? ?Past Medical History:  ?Diagnosis Date  ? Ductal carcinoma in situ (DCIS) of left breast   ? Dysphagia   ? GERD (gastroesophageal reflux disease)   ? Hiatal hernia   ? Hypertension   ? Iron deficiency anemia   ? Osteoporosis   ? Pulmonary embolism ([place])   ? Scoliosis   ? UTI (urinary tract infection)   ? ? ?Past Surgical History:  ?Procedure Laterality Date  ? BREAST LUMPECTOMY Left 2009  ? Ductal Carcinoma insitu   ? CATARACT EXTRACTION, BILATERAL    ? COLONOSCOPY N/A 03/17/2021  ? Procedure: COLONOSCOPY;  Surgeon: Lesly Rubenstein, MD;  Location: [facility] ENDOSCOPY;  Service: Endoscopy;  Laterality: N/A;  ? COLOSTOMY REVISION Right 03/18/2021  ? Procedure: COLON RESECTION RIGHT;  Surgeon: Olean Ree, MD;  Location: [facility] ORS;  Service: General;  Laterality: Right;  ? LAPAROSCOPIC PARAESOPHAGEAL HERNIA REPAIR  2009  ? LAPAROTOMY N/A 03/10/2021  ? Procedure: EXPLORATORY LAPAROTOMY;  Surgeon: Olean Ree, MD;  Location: [facility] ORS;  Service: General;  Laterality: N/A;  ? TUBAL LIGATION    ? ? ?There were no vitals filed for this visit. ? ? Subjective Assessment - 03/02/22 1519   ? ? Subjective Patient reports that she has another UTI  and not feeling well.   ? Pertinent History Pt underwent "2 major surgies" last April and reports a slow deterioration in her balance and strength since that time. She is fearful of falling and if she stands for an extended period of time her legs feel weak.  Pt goes to the [facility] for exercise twice/week. No falls in the last 6-12 months. She uses a single point cane in her RUE to help with her balance during ambulation. Patient lives in a single story townhome (one step to enter) independently. She has a walk-in shower with a seat and grab bars. She has chronic L low back pain and uses topical patches to manage. She takes Warfarin so she can only use Tylenol to help with her pain. She was recently started on low-dose Norvasc due to hypertension which has been effective at lowering her blood pressure readings.   ? Diagnostic tests None reported   ? Patient Stated Goals Improve balance and decrease risk for falls   ? Currently in Pain? Yes   ? Pain Score 6    ? Pain Location Back   ? Pain Orientation Mid   ? Pain Descriptors / Indicators Aching;Sore   ? Pain Type Chronic pain   ? Pain Onset More than a month ago   ?  Aggravating Factors  Prolonged sitting/standing   ? Pain Relieving Factors Rest/stretching/Heat/Meds   ? Effect of Pain on Daily Activities Difficulty with all ADL's   ? ?  ?  ? ?  ? ? ? ? ?INTERVENTIONS: ? ? ? ? ?  ?Exercise/Activity Sets/ Reps/Time/ Resistance Assistance Charge type Comments Unless otherwise stated, CGA was provided and gait belt donned in order to ensure pt safety ?   ?Ball roll outs - Lumbar flex bias to Left/Center/Right 3 way x 5 times ea    There ex  To improve lower body tightness and mobility. Patient reported "I really think this stretch is helping me."  ?Hip march 2.5 lb. AW with abdominal bracing  10 reps each LE ?2.5 lb   Therex VC to keep core engaged  and for proper breathing.   ? Seated Ham curl 2.5 lb with TrA contraction  10 reps each LE ?2.5 lb AW   Therex     ?STS - Edge of mat X10 reps ?  ?X 10 more with core engaged CGA Therex  No UE Support , no LOB noted, improved with efficacy   ?Seated Side step over hedgehog  X 12 reps ?2.5 lb  Therex  At Support bar- VC to widen steps to clear foam.   ?LAQ - core engaged 2 x 10  reps  Therex  Cues for hold times  ?Scap retract  2 x 10 reps with RTB  Therex  Verbal Cues and visual demo for proper form  ? Shoulder ext  2 x 10 reps with RTB    Therex    ?           ?           ?Treatment provided this session ?  ?  ?Pt educated throughout session about proper posture and technique with exercises. Improved exercise technique, movement at target joints, use of target muscles after min to mod verbal, visual, tactile cues. ?  ?  ?Access Code: [email or web address] ?URL: [email or web address] ?Date: 03/02/2022 ?Prepared by: Sande Brothers ? ?Exercises ?- Scapular Retraction with Resistance  - 1 x daily - 3 x weekly - 3 sets - 10 reps - 2 hold ?- Shoulder extension with resistance - Neutral  - 1 x daily - 3 x weekly - 3 sets - 10 reps - 2 hold ? ? ? ? ? ? ? ? ? ? ? ? ? ? ? ? ? ? ? ? ? PT Education - 03/02/22 1524   ? ? Education Details Exerciset technique   ? Person(s) Educated Patient   ? Methods Explanation;Demonstration;Tactile cues;Verbal cues   ? Comprehension Verbalized understanding;Tactile cues required;Verbal cues required;Returned demonstration;Need further instruction   ? ?  ?  ? ?  ? ? ? PT Short Term Goals - 02/03/22 1336   ? ?  ? PT SHORT TERM GOAL #1  ? Title Pt will be independent with HEP in order to improve strength and balance in order to decrease fall risk and improve function at home.   ? Time 4   ? Period Weeks   ? Status New   ? Target Date 03/02/22   ?  ? PT SHORT TERM GOAL #2  ? Title Pt will improve BERG by at least 3 points in order to demonstrate clinically significant improvement in balance.   ? Baseline 02/02/22: 37/56   ? Time 4   ? Period Weeks   ? Status New   ? Target Date 03/02/22   ? ?  ?  ? ?   ? ? ? ?  PT Long Term Goals - 02/03/22 1337   ? ?  ? PT LONG TERM GOAL #1  ? Title Pt will improve BERG to at least 43/56 in order to demonstrate clinically significant improvement in balance and decreased risk for falls   ? Baseline 02/02/22: 37/56   ? Time 8   ? Period Weeks   ? Status New   ? Target Date 03/30/22   ?  ? PT LONG TERM GOAL #2  ? Title Pt will increase self-selected 10MWT by at least 0.13 m/s in order to demonstrate clinically significant improvement in community ambulation.   ? Baseline 02/02/22: self-selected: 14.7s = 0.68 m/s   ? Time 8   ? Period Weeks   ? Status New   ? Target Date 03/30/22   ?  ? PT LONG TERM GOAL #3  ? Title Pt will be able to perform sit to stand x 5 from regular height chair without UE use in order to improvement independence with transfers and decrease her risk for falls   ? Baseline 02/02/22: unable to perform 1 repetition   ? Time 8   ? Period Weeks   ? Status New   ? Target Date 03/30/22   ?  ? PT LONG TERM GOAL #4  ? Title Pt will decrease TUG to below 12 seconds in order to demonstrate decreased fall risk and improved functional mobility   ? Baseline 02/02/22: 14.8s   ? Time 8   ? Period Weeks   ? Status New   ? Target Date 03/30/22   ? ?  ?  ? ?  ? ? ? ? ? ? ? ? Plan - 03/02/22 1525   ? ? Clinical Impression Statement Patient presented today with good motivation- able to participate well with all activities without report of increased overall pain. Patient was able to progress to increased resistance with LE/Core activities as well as able to progress to more standing postural training today. She required VC and visual demo to perform correctly yet quickly improved and able to return demonstration well today. Patient will continue to benefit from skilled physical therapy intervention in order to improve her balance, decreased risk of falls, improve her mobility, and improve her overall quality of life   ? Personal Factors and Comorbidities Age;Comorbidity 3+   ?  Comorbidities anemia, HTN, aortic stenosis, chronic back pain   ? Examination-Activity Limitations Bathing;Bend;Squat;Stairs;Stand;Transfers   ? Examination-Participation Restrictions Community Activity;Shop;Meal Prep

## 2022-03-03 ENCOUNTER — Ambulatory Visit: Payer: Medicare Other

## 2022-03-07 ENCOUNTER — Inpatient Hospital Stay: Payer: Medicare Other | Attending: Oncology | Admitting: Oncology

## 2022-03-07 ENCOUNTER — Encounter: Payer: Self-pay | Admitting: Oncology

## 2022-03-07 VITALS — BP 130/81 | HR 73 | Temp 97.0°F | Resp 16 | Wt 96.7 lb

## 2022-03-07 DIAGNOSIS — C189 Malignant neoplasm of colon, unspecified: Secondary | ICD-10-CM | POA: Diagnosis not present

## 2022-03-07 DIAGNOSIS — D509 Iron deficiency anemia, unspecified: Secondary | ICD-10-CM | POA: Insufficient documentation

## 2022-03-07 DIAGNOSIS — Z08 Encounter for follow-up examination after completed treatment for malignant neoplasm: Secondary | ICD-10-CM

## 2022-03-07 DIAGNOSIS — Z79899 Other long term (current) drug therapy: Secondary | ICD-10-CM | POA: Diagnosis not present

## 2022-03-07 DIAGNOSIS — Z85038 Personal history of other malignant neoplasm of large intestine: Secondary | ICD-10-CM | POA: Diagnosis not present

## 2022-03-07 NOTE — Progress Notes (Signed)
Patient here for follow up she denies chills, shortness of breath. ?

## 2022-03-07 NOTE — Progress Notes (Signed)
? ? ? ?Hematology/Oncology Consult note ?[facility]  ?[phone] [phone] ? ?Patient Care Team: ?Calvin as PCP - General ?Kate Sable, MD as PCP - Cardiology (Cardiology) ?Sindy Guadeloupe, MD as Consulting Physician (Hematology and Oncology)  ? ?Name of the patient: Kristina Rowe  ?[id number]  ?Feb 08, 1930  ? ?Date of visit: 03/07/22 ? ?Diagnosis- stage III colon cancer ? ?Chief complaint/ Reason for visit-routine follow-up of colon cancer ? ?Heme/Onc history: Patient is a 88 year old female who initially presented to the ER with symptoms of significant abdominal pain.  CT abdomen and pelvis on 03/10/2021 showed volvulus along with a large hiatal hernia.  Patient initially had an exploratory laparotomy with lysis of adhesion by Dr. Hampton Abbot.  She then presented back to the ER with similar complaints and a repeat CT abdomen at that time showed bulky ascending colon mass compatible with carcinoma and possible local regional adenopathy.  No recurrent volvulus.  Patient underwent open right colectomy on 03/18/2021.  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  All regional lymph nodes 32 of them were negative for tumor but there was a tumor deposit present at 1 site.  PT3PN1C. ?  ?Risks versus benefits of adjuvant chemotherapy was discussed and patient was not deemed to be a candidate for adjuvant chemotherapy and she did not wish to go through chemotherapy herself.  Anemia work-up was consistent with iron deficiency and patient received 2 doses of Feraheme ? ?Interval history-patient is doing well for her age.  Appetite is good.  Denies any changes in her bowel habits.  Denies any significant nausea. ? ?ECOG PS- 1 ?Pain scale- 0 ? ? ?Review of systems- Review of Systems  ?Constitutional:  Negative for chills, fever, malaise/fatigue and weight loss.  ?HENT:  Negative for congestion, ear  discharge and nosebleeds.   ?Eyes:  Negative for blurred vision.  ?Respiratory:  Negative for cough, hemoptysis, sputum production, shortness of breath and wheezing.   ?Cardiovascular:  Negative for chest pain, palpitations, orthopnea and claudication.  ?Gastrointestinal:  Negative for abdominal pain, blood in stool, constipation, diarrhea, heartburn, melena, nausea and vomiting.  ?Genitourinary:  Negative for dysuria, flank pain, frequency, hematuria and urgency.  ?Musculoskeletal:  Negative for back pain, joint pain and myalgias.  ?Skin:  Negative for rash.  ?Neurological:  Negative for dizziness, tingling, focal weakness, seizures, weakness and headaches.  ?Endo/Heme/Allergies:  Does not bruise/bleed easily.  ?Psychiatric/Behavioral:  Negative for depression and suicidal ideas. The patient does not have insomnia.    ? ? ?Allergies  ?Allergen Reactions  ? Metronidazole Other (See Comments)  ?  Other reaction(s): Other ?Mouth sores ?Mouth sores ?Mouth sores ?  ? Omeprazole Other (See Comments)  ?  Other reaction(s): Arthralgias (intolerance) ?Other reaction(s): Other ?  ? Bactrim [Sulfamethoxazole-Trimethoprim] Nausea Only  ? Codeine Nausea And Vomiting and Nausea Only  ?  Other reaction(s): Nausea Only ?But tolerates tramadol ?  ? ? ? ?Past Medical History:  ?Diagnosis Date  ? Ductal carcinoma in situ (DCIS) of left breast   ? Dysphagia   ? GERD (gastroesophageal reflux disease)   ? Hiatal hernia   ? Hypertension   ? Iron deficiency anemia   ? Osteoporosis   ? Pulmonary embolism ([place])   ? Scoliosis   ? UTI (urinary tract infection)   ? ? ? ?Past Surgical History:  ?Procedure Laterality Date  ? BREAST LUMPECTOMY Left 2009  ? Ductal Carcinoma insitu   ?  CATARACT EXTRACTION, BILATERAL    ? COLONOSCOPY N/A 03/17/2021  ? Procedure: COLONOSCOPY;  Surgeon: Lesly Rubenstein, MD;  Location: [facility] ENDOSCOPY;  Service: Endoscopy;  Laterality: N/A;  ? COLOSTOMY REVISION Right 03/18/2021  ? Procedure: COLON RESECTION RIGHT;   Surgeon: Olean Ree, MD;  Location: [facility] ORS;  Service: General;  Laterality: Right;  ? LAPAROSCOPIC PARAESOPHAGEAL HERNIA REPAIR  2009  ? LAPAROTOMY N/A 03/10/2021  ? Procedure: EXPLORATORY LAPAROTOMY;  Surgeon: Olean Ree, MD;  Location: [facility] ORS;  Service: General;  Laterality: N/A;  ? TUBAL LIGATION    ? ? ?Social History  ? ?Socioeconomic History  ? Marital status: Married  ?  Spouse name: Not on file  ? Number of children: Not on file  ? Years of education: Not on file  ? Highest education level: Not on file  ?Occupational History  ? Not on file  ?Tobacco Use  ? Smoking status: Never  ? Smokeless tobacco: Never  ?Vaping Use  ? Vaping Use: Never used  ?Substance and Sexual Activity  ? Alcohol use: Never  ? Drug use: Never  ? Sexual activity: Not Currently  ?Other Topics Concern  ? Not on file  ?Social History Narrative  ? Not on file  ? ?Social Determinants of Health  ? ?Financial Resource Strain: Not on file  ?Food Insecurity: Not on file  ?Transportation Needs: Not on file  ?Physical Activity: Not on file  ?Stress: Not on file  ?Social Connections: Not on file  ?Intimate Partner Violence: Not on file  ? ? ?Family History  ?Problem Relation Age of Onset  ? Breast cancer Neg Hx   ? ? ? ?Current Outpatient Medications:  ?  acetaminophen (TYLENOL) 500 MG tablet, Take 500 mg by mouth at bedtime as needed for mild pain, fever or headache., Disp: , Rfl:  ?  amLODipine (NORVASC) 2.5 MG tablet, Take 2.5 mg by mouth daily., Disp: , Rfl:  ?  ascorbic acid (VITAMIN C) 250 MG tablet, Take by mouth., Disp: , Rfl:  ?  calcium carbonate (OS-CAL) 1250 (500 Ca) MG chewable tablet, Chew 1 tablet by mouth daily., Disp: , Rfl:  ?  Cholecalciferol 50 MCG (2000 UT) CAPS, Take by mouth., Disp: , Rfl:  ?  cyanocobalamin 1000 MCG tablet, Take 1,000 mcg by mouth daily., Disp: , Rfl:  ?  diclofenac Sodium (VOLTAREN) 1 % GEL, Apply topically 4 (four) times daily., Disp: , Rfl:  ?  econazole nitrate 1 % cream, Apply 1 application  topically daily., Disp: , Rfl:  ?  estradiol (ESTRACE) 0.1 MG/GM vaginal cream, Estrogen Cream Instruction Discard applicator Apply pea sized amount to tip of finger to urethra before bed. Wash hands well after application. Use Monday, Wednesday and Friday, Disp: 42.5 g, Rfl: 3 ?  famotidine (PEPCID) 40 MG tablet, Take 40 mg by mouth 2 (two) times daily., Disp: , Rfl:  ?  folic acid (FOLVITE) 1 MG tablet, Take 1 mg by mouth daily., Disp: , Rfl:  ?  loperamide (IMODIUM) 2 MG capsule, Take 2 mg by mouth as needed for diarrhea or loose stools., Disp: , Rfl:  ?  mirtazapine (REMERON) 15 MG tablet, Take 15 mg by mouth at bedtime., Disp: , Rfl:  ?  mirtazapine (REMERON) 30 MG tablet, Take 15 mg by mouth at bedtime., Disp: , Rfl:  ?  ondansetron (ZOFRAN ODT) 4 MG disintegrating tablet, Take 1 tablet (4 mg total) by mouth every 8 (eight) hours as needed for nausea or vomiting., Disp: 20  tablet, Rfl: 0 ?  pantoprazole (PROTONIX) 40 MG tablet, Take 40 mg by mouth daily., Disp: , Rfl:  ?  promethazine (PHENERGAN) 25 MG tablet, Take 25 mg by mouth every 6 (six) hours as needed for nausea or vomiting., Disp: , Rfl:  ?  warfarin (COUMADIN) 2.5 MG tablet, On Sundays and Thursdays resume the 2.5 mg dose.  All other days take 5 mg. (Patient taking differently: Take 2.5-5 mg by mouth daily at 12 noon. 2.5 mg sun Thursday, all other days is '5mg'$ ), Disp: 30 tablet, Rfl: 11 ?  warfarin (COUMADIN) 5 MG tablet, Take by mouth., Disp: , Rfl:  ? ?Physical exam:  ?Vitals:  ? 03/07/22 1128  ?BP: 130/81  ?Pulse: 73  ?Resp: 16  ?Temp: (!) 97 ?F (36.1 ?C)  ?TempSrc: Tympanic  ?Weight: 96 lb 11.2 oz (43.9 kg)  ? ?Physical Exam ?Constitutional:   ?   Comments: Thin elderly female in no acute distress  ?Cardiovascular:  ?   Rate and Rhythm: Normal rate and regular rhythm.  ?   Heart sounds: Normal heart sounds.  ?Pulmonary:  ?   Effort: Pulmonary effort is normal.  ?   Breath sounds: Normal breath sounds.  ?Abdominal:  ?   General: Bowel sounds are  normal.  ?   Palpations: Abdomen is soft.  ?Skin: ?   General: Skin is warm and dry.  ?Neurological:  ?   Mental Status: She is alert and oriented to person, place, and time.  ?  ? ? ?  Latest Ref Rng & Un

## 2022-03-08 ENCOUNTER — Ambulatory Visit (INDEPENDENT_AMBULATORY_CARE_PROVIDER_SITE_OTHER): Payer: Medicare Other | Admitting: Physician Assistant

## 2022-03-08 ENCOUNTER — Encounter: Payer: Self-pay | Admitting: Physician Assistant

## 2022-03-08 VITALS — BP 151/79 | HR 78 | Ht <= 58 in | Wt 92.0 lb

## 2022-03-08 DIAGNOSIS — N390 Urinary tract infection, site not specified: Secondary | ICD-10-CM

## 2022-03-08 DIAGNOSIS — N3281 Overactive bladder: Secondary | ICD-10-CM

## 2022-03-08 LAB — URINALYSIS, COMPLETE
Bilirubin, UA: NEGATIVE
Glucose, UA: NEGATIVE
Ketones, UA: NEGATIVE
Leukocytes,UA: NEGATIVE
Nitrite, UA: NEGATIVE
Protein,UA: NEGATIVE
RBC, UA: NEGATIVE
Specific Gravity, UA: 1.015 (ref 1.005–1.030)
Urobilinogen, Ur: 0.2 mg/dL (ref 0.2–1.0)
pH, UA: 6.5 (ref 5.0–7.5)

## 2022-03-08 LAB — BLADDER SCAN AMB NON-IMAGING: Scan Result: 35

## 2022-03-08 LAB — MICROSCOPIC EXAMINATION: RBC, Urine: NONE SEEN /hpf (ref 0–2)

## 2022-03-08 NOTE — Patient Instructions (Addendum)
Recurrent UTI Prevention Strategies ? ?Stay well hydrated. ?Continue cranberry juice, or consider starting an over-the-counter cranberry supplement for urinary tract health. Take this once or twice daily on an empty stomach, e.g. right before bed. ?Start taking an over-the-counter probiotic, preferably containing lactobacillus. Take this daily. ?Continue vaginal estrogen cream (Premarin, Estrace, or estradiol). Apply a pea-sized amount around the urethra three times weekly.  ?

## 2022-03-08 NOTE — Progress Notes (Signed)
? ?03/08/2022 ?11:43 AM  ? ?Kristina Rowe ?Feb 19, 1930 ?[id number] ? ?CC: ?Chief Complaint  ?Patient presents with  ? Recurrent UTI  ? ?HPI: ?Kristina Rowe is a 88 y.o. female with PMH OAB wet previously on tolterodine discontinued Myrbetriq due to cost and recurrent UTI on topical vaginal estrogen cream who presents today for evaluation of recurrent UTI.  ? ?Today she reports 2 separate urinary tract infections over the past 6 months that required multiple courses of treatment.  Her most recent infection resolved 1 week ago.  Per chart review, she has had 3 positive urine cultures since October 2022 as follows: ?09/15/2021: Proteus vulgaris ?11/04/2021: Klebsiella pneumoniae ?02/14/2022: Klebsiella pneumoniae ? ?She reports her bothersome urinary symptoms have resolved today.  She is off tolterodine due to dizziness and overall reports minimal bother from her oxybutynin.  She states its more important to her to avoid additional medications and side effects to reduce her risk for falls than it is to treat her OAB at this time. ? ?She reports using her vaginal estrogen cream only approximately once weekly.  She also drinks cranberry juice. ? ?In-office UA and microscopy today pan negative. PVR 82m. ? ?PMH: ?Past Medical History:  ?Diagnosis Date  ? Ductal carcinoma in situ (DCIS) of left breast   ? Dysphagia   ? GERD (gastroesophageal reflux disease)   ? Hiatal hernia   ? Hypertension   ? Iron deficiency anemia   ? Osteoporosis   ? [place]   ? Scoliosis   ? UTI (urinary tract infection)   ? ? ?Surgical History: ?Past Surgical History:  ?Procedure Laterality Date  ? BREAST LUMPECTOMY Left 2009  ? Ductal Carcinoma insitu   ? CATARACT EXTRACTION, BILATERAL    ? COLONOSCOPY N/A 03/17/2021  ? Procedure: COLONOSCOPY;  Surgeon: LLesly Rubenstein MD;  [facility];  Service: Endoscopy;  Laterality: N/A;  ? COLOSTOMY REVISION Right 03/18/2021  ? Procedure: COLON RESECTION RIGHT;  Surgeon:  POlean Ree MD;  Location: [facility] ORS;  Service: General;  Laterality: Right;  ? LAPAROSCOPIC PARAESOPHAGEAL HERNIA REPAIR  2009  ? LAPAROTOMY N/A 03/10/2021  ? Procedure: EXPLORATORY LAPAROTOMY;  Surgeon: POlean Ree MD;  Location: [facility] ORS;  Service: General;  Laterality: N/A;  ? TUBAL LIGATION    ? ? ?Home Medications:  ?Allergies as of 03/08/2022   ? ?   Reactions  ? Metronidazole Other (See Comments)  ? Other reaction(s): Other ?Mouth sores ?Mouth sores ?Mouth sores  ? Omeprazole Other (See Comments)  ? Other reaction(s): Arthralgias (intolerance) ?Other reaction(s): Other  ? Bactrim [sulfamethoxazole-trimethoprim] Nausea Only  ? Codeine Nausea And Vomiting, Nausea Only  ? Other reaction(s): Nausea Only ?But tolerates tramadol  ? ?  ? ?  ?Medication List  ?  ? ?  ? Accurate as of March 08, 2022 11:43 AM. If you have any questions, ask your nurse or doctor.  ?  ?  ? ?  ? ?acetaminophen 500 MG tablet ?Commonly known as: TYLENOL ?Take 500 mg by mouth at bedtime as needed for mild pain, fever or headache. ?  ?amLODipine 2.5 MG tablet ?Commonly known as: NORVASC ?Take 2.5 mg by mouth daily. ?  ?ascorbic acid 250 MG tablet ?Commonly known as: VITAMIN C ?Take by mouth. ?  ?calcium carbonate 1250 (500 Ca) MG chewable tablet ?Commonly known as: OS-CAL ?Chew 1 tablet by mouth daily. ?  ?Cholecalciferol 50 MCG (2000 UT) Caps ?Take by mouth. ?  ?cyanocobalamin 1000 MCG tablet ?Take 1,000 mcg  by mouth daily. ?  ?diclofenac Sodium 1 % Gel ?Commonly known as: VOLTAREN ?Apply topically 4 (four) times daily. ?  ?econazole nitrate 1 % cream ?Apply 1 application topically daily. ?  ?estradiol 0.1 MG/GM vaginal cream ?Commonly known as: ESTRACE ?Estrogen Cream Instruction Discard applicator Apply pea sized amount to tip of finger to urethra before bed. Wash hands well after application. Use Monday, Wednesday and Friday ?  ?famotidine 40 MG tablet ?Commonly known as: PEPCID ?Take 40 mg by mouth 2 (two) times daily. ?  ?folic acid  1 MG tablet ?Commonly known as: FOLVITE ?Take 1 mg by mouth daily. ?  ?loperamide 2 MG capsule ?Commonly known as: IMODIUM ?Take 2 mg by mouth as needed for diarrhea or loose stools. ?  ?mirtazapine 30 MG tablet ?Commonly known as: REMERON ?Take 15 mg by mouth at bedtime. ?  ?mirtazapine 15 MG tablet ?Commonly known as: REMERON ?Take 15 mg by mouth at bedtime. ?  ?ondansetron 4 MG disintegrating tablet ?Commonly known as: Zofran ODT ?Take 1 tablet (4 mg total) by mouth every 8 (eight) hours as needed for nausea or vomiting. ?  ?pantoprazole 40 MG tablet ?Commonly known as: PROTONIX ?Take 40 mg by mouth daily. ?  ?promethazine 25 MG tablet ?Commonly known as: PHENERGAN ?Take 25 mg by mouth every 6 (six) hours as needed for nausea or vomiting. ?  ?warfarin 2.5 MG tablet ?Commonly known as: Coumadin ?On Sundays and Thursdays resume the 2.5 mg dose.  All other days take 5 mg. ?What changed:  ?how much to take ?how to take this ?when to take this ?additional instructions ?  ?warfarin 5 MG tablet ?Commonly known as: COUMADIN ?Take by mouth. ?What changed: Another medication with the same name was changed. Make sure you understand how and when to take each. ?  ? ?  ? ? ?Allergies:  ?Allergies  ?Allergen Reactions  ? Metronidazole Other (See Comments)  ?  Other reaction(s): Other ?Mouth sores ?Mouth sores ?Mouth sores ?  ? Omeprazole Other (See Comments)  ?  Other reaction(s): Arthralgias (intolerance) ?Other reaction(s): Other ?  ? Bactrim [Sulfamethoxazole-Trimethoprim] Nausea Only  ? Codeine Nausea And Vomiting and Nausea Only  ?  Other reaction(s): Nausea Only ?But tolerates tramadol ?  ? ? ?Family History: ?Family History  ?Problem Relation Age of Onset  ? Breast cancer Neg Hx   ? ? ?Social History:  ? reports that she has never smoked. She has never used smokeless tobacco. She reports that she does not drink alcohol and does not use drugs. ? ?Physical Exam: ?BP (!) 151/79   Pulse 78   Ht '4\' 10"'$  (1.473 m)   Wt 92 lb  (41.7 kg)   BMI 19.23 kg/m?   ?Constitutional:  Alert and oriented, no acute distress, nontoxic appearing ?HEENT: [place], AT ?Cardiovascular: No clubbing, cyanosis, or edema ?Respiratory: Normal respiratory effort, no increased work of breathing ?Skin: No rashes, bruises or suspicious lesions ?Neurologic: Grossly intact, no focal deficits, moving all 4 extremities ?Psychiatric: Normal mood and affect ? ?Laboratory Data: ?Results for orders placed or performed in visit on 03/08/22  ?Microscopic Examination  ? Urine  ?Result Value Ref Range  ? WBC, UA 0-5 0 - 5 /hpf  ? RBC None seen 0 - 2 /hpf  ? Epithelial Cells (non renal) 0-10 0 - 10 /hpf  ? Bacteria, UA Few None seen/Few  ?Urinalysis, Complete  ?Result Value Ref Range  ? Specific Gravity, UA 1.015 1.005 - 1.030  ? pH, UA  6.5 5.0 - 7.5  ? Color, UA Yellow Yellow  ? Appearance Ur Clear Clear  ? Leukocytes,UA Negative Negative  ? Protein,UA Negative Negative/Trace  ? Glucose, UA Negative Negative  ? Ketones, UA Negative Negative  ? RBC, UA Negative Negative  ? Bilirubin, UA Negative Negative  ? Urobilinogen, Ur 0.2 0.2 - 1.0 mg/dL  ? Nitrite, UA Negative Negative  ? Microscopic Examination See below:   ?Bladder Scan (Post Void Residual) in office  ?Result Value Ref Range  ? Scan Result 35   ? ?Assessment & Plan:   ?1. Recurrent UTI ?UA bland today and she is not clinically infected.  We discussed increasing vaginal estrogen cream to 3 times weekly and the pathophysiology of genitourinary syndrome of menopause and its role in recurrent UTIs. ?- Urinalysis, Complete ?- Bladder Scan (Post Void Residual) in office ? ?2. OAB (overactive bladder) ?Off tolterodine due to dizziness.  We discussed that Myrbetriq may no longer be cost prohibitive as it has been several years since she last tried it and her insurance coverage may have changed.  Will defer pharmacotherapy at this time due to minimal patient bother, however may consider restarting in the future per patient  preference. ? ?Return if symptoms worsen or fail to improve. ? ?Debroah Loop, PA-C ? ?[facility] ?[address] ?[place], [place] [address] ?[phone] ?   ?

## 2022-03-09 ENCOUNTER — Ambulatory Visit: Payer: Medicare Other | Admitting: Physical Therapy

## 2022-03-09 DIAGNOSIS — R2681 Unsteadiness on feet: Secondary | ICD-10-CM

## 2022-03-09 DIAGNOSIS — R262 Difficulty in walking, not elsewhere classified: Secondary | ICD-10-CM

## 2022-03-09 DIAGNOSIS — R2689 Other abnormalities of gait and mobility: Secondary | ICD-10-CM

## 2022-03-09 NOTE — Therapy (Signed)
[place] ?[facility] ?[place], [place], [address] ?Phone: [phone]   Fax:  [phone] ? ?Physical Therapy Treatment ? ?Patient Details  ?Name: Kristina Rowe ?MRN: [id number] ?Date of Birth: 11-Feb-1930 ?Referring Provider (PT): Dr. Tressia Miners ? ? ?Encounter Date: 03/09/2022 ? ? PT End of Session - 03/09/22 1138   ? ? Visit Number 9   ? Number of Visits 17   ? Date for PT Re-Evaluation 03/30/22   ? Authorization Type eval: 02/02/22   ? Progress Note Due on Visit 10   ? PT Start Time 1149   ? PT Stop Time 1230   ? PT Time Calculation (min) 41 min   ? Equipment Utilized During Treatment Gait belt   ? Activity Tolerance Patient tolerated treatment well   ? Behavior During Therapy [facility] for tasks assessed/performed   ? ?  ?  ? ?  ? ? ?Past Medical History:  ?Diagnosis Date  ? Ductal carcinoma in situ (DCIS) of left breast   ? Dysphagia   ? GERD (gastroesophageal reflux disease)   ? Hiatal hernia   ? Hypertension   ? Iron deficiency anemia   ? Osteoporosis   ? Pulmonary embolism ([place])   ? Scoliosis   ? UTI (urinary tract infection)   ? ? ?Past Surgical History:  ?Procedure Laterality Date  ? BREAST LUMPECTOMY Left 2009  ? Ductal Carcinoma insitu   ? CATARACT EXTRACTION, BILATERAL    ? COLONOSCOPY N/A 03/17/2021  ? Procedure: COLONOSCOPY;  Surgeon: Lesly Rubenstein, MD;  [facility];  Service: Endoscopy;  Laterality: N/A;  ? COLOSTOMY REVISION Right 03/18/2021  ? Procedure: COLON RESECTION RIGHT;  Surgeon: Olean Ree, MD;  Location: [facility] ORS;  Service: General;  Laterality: Right;  ? LAPAROSCOPIC PARAESOPHAGEAL HERNIA REPAIR  2009  ? LAPAROTOMY N/A 03/10/2021  ? Procedure: EXPLORATORY LAPAROTOMY;  Surgeon: Olean Ree, MD;  Location: [facility] ORS;  Service: General;  Laterality: N/A;  ? TUBAL LIGATION    ? ? ?There were no vitals filed for this visit. ? ? Subjective Assessment - 03/09/22 1138   ? ? Subjective Pt reports she has been feeling better  and recovering from her UTI. Reports she was happy to show her kids how much better she could get up from a chair.   ? Pertinent History Pt underwent "2 major surgies" last April and reports a slow deterioration in her balance and strength since that time. She is fearful of falling and if she stands for an extended period of time her legs feel weak.  Pt goes to the [facility] for exercise twice/week. No falls in the last 6-12 months. She uses a single point cane in her RUE to help with her balance during ambulation. Patient lives in a single story townhome (one step to enter) independently. She has a walk-in shower with a seat and grab bars. She has chronic L low back pain and uses topical patches to manage. She takes Warfarin so she can only use Tylenol to help with her pain. She was recently started on low-dose Norvasc due to hypertension which has been effective at lowering her blood pressure readings.   ? Diagnostic tests None reported   ? Patient Stated Goals Improve balance and decrease risk for falls   ? Pain Onset More than a month ago   ? ?  ?  ? ?  ? ? ?INTERVENTIONS: ? ? ? ? ?  ?Exercise/Activity Sets/ Reps/Time/ Resistance Assistance  Charge type Comments Unless otherwise stated, CGA was provided and gait belt donned in order to ensure pt safety ?   ?      ?Hip march 2.5 lb. AW with abdominal bracing  10 reps each LE ?2.5 lb   Therex VC to keep core engaged  and for proper breathing.   ?HS curl with core activation w/ postural focus  2 x 10 x RTB   therex Cues for upright posture   ?STS - on airex pad  1*10 pad ?1*5 pad  ?1*2 no pad, no UE  CGA Therex  No UE Support , no LOB noted, improved with efficacy   ?Seated Side step over hedgehog  X 12 reps ?2.5 lb  Therex  At Support bar- VC to widen steps to clear foam.   ?LAQ - postural focus  2 x 10  reps x 3 #   Therex    ?1/2 FR rocks  ?1/2 FR stance  X 20  ?2 x 45 sec  UE ?Intermittent UE, CGA  NMR Improved stability with intermittent UE  ?Cues for  proper positioning   ?Airex adducted stance  2 x 45 sec  ?1 x 45 sec with scap retraction  Intermittent UE, CGA  NMR  Good stability noted, increased difficulty with   ?           ?           ?Treatment provided this session ?  ?  ?Pt educated throughout session about proper posture and technique with exercises. Improved exercise technique, movement at target joints, use of target muscles after min to mod verbal, visual, tactile cues. ?  ? ? ? ? ? ? ? ? ? ? ? ? ? ? ? ? ? ? ? ? ? ? ? ? ? ? PT Education - 03/09/22 1302   ? ? Education Details exercise technique   ? Person(s) Educated Patient   ? Methods Explanation;Demonstration;Verbal cues   ? Comprehension Verbalized understanding;Returned demonstration;Verbal cues required   ? ?  ?  ? ?  ? ? ? PT Short Term Goals - 02/03/22 1336   ? ?  ? PT SHORT TERM GOAL #1  ? Title Pt will be independent with HEP in order to improve strength and balance in order to decrease fall risk and improve function at home.   ? Time 4   ? Period Weeks   ? Status New   ? Target Date 03/02/22   ?  ? PT SHORT TERM GOAL #2  ? Title Pt will improve BERG by at least 3 points in order to demonstrate clinically significant improvement in balance.   ? Baseline 02/02/22: 37/56   ? Time 4   ? Period Weeks   ? Status New   ? Target Date 03/02/22   ? ?  ?  ? ?  ? ? ? ? PT Long Term Goals - 02/03/22 1337   ? ?  ? PT LONG TERM GOAL #1  ? Title Pt will improve BERG to at least 43/56 in order to demonstrate clinically significant improvement in balance and decreased risk for falls   ? Baseline 02/02/22: 37/56   ? Time 8   ? Period Weeks   ? Status New   ? Target Date 03/30/22   ?  ? PT LONG TERM GOAL #2  ? Title Pt will increase self-selected 10MWT by at least 0.13 m/s in order to demonstrate clinically significant improvement in community ambulation.   ?  Baseline 02/02/22: self-selected: 14.7s = 0.68 m/s   ? Time 8   ? Period Weeks   ? Status New   ? Target Date 03/30/22   ?  ? PT LONG TERM GOAL #3  ? Title Pt  will be able to perform sit to stand x 5 from regular height chair without UE use in order to improvement independence with transfers and decrease her risk for falls   ? Baseline 02/02/22: unable to perform 1 repetition   ? Time 8   ? Period Weeks   ? Status New   ? Target Date 03/30/22   ?  ? PT LONG TERM GOAL #4  ? Title Pt will decrease TUG to below 12 seconds in order to demonstrate decreased fall risk and improved functional mobility   ? Baseline 02/02/22: 14.8s   ? Time 8   ? Period Weeks   ? Status New   ? Target Date 03/30/22   ? ?  ?  ? ?  ? ? ? ? ? ? ? ? Plan - 03/09/22 1139   ? ? Clinical Impression Statement Pt is pleasant and presents with great motivation for completion of PT interventions. Continued to progress with LE strengthening as well as integratino of postural and core muscle activation this session. WIth postural activation pt reported improvement in s/s of pain with certain exercises. Pt also progressed with STS this sessino and was able to perform from standard chair without UE support. Pt will continue to benefit from skilled PT intervetions in order to improve her balance, stability, strenght, and reduce risk of falls.   ? Personal Factors and Comorbidities Age;Comorbidity 3+   ? Comorbidities anemia, HTN, aortic stenosis, chronic back pain   ? Examination-Activity Limitations Bathing;Bend;Squat;Stairs;Stand;Transfers   ? Examination-Participation Restrictions Community Activity;Shop;Meal Prep   ? Stability/Clinical Decision Making Evolving/Moderate complexity   ? Rehab Potential Good   ? PT Frequency 2x / week   ? PT Duration 8 weeks   ? PT Treatment/Interventions ADLs/Self Care Home Management;Aquatic Therapy;Canalith Repostioning;Cryotherapy;Electrical Stimulation;Moist Heat;Iontophoresis '4mg'$ /ml Dexamethasone;Traction;Ultrasound;Gait training;Stair training;Functional mobility training;Therapeutic activities;Therapeutic exercise;Balance training;Neuromuscular re-education;Cognitive  remediation;Patient/family education;Manual techniques;Passive range of motion;Dry needling;Vestibular;Spinal Manipulations;Joint Manipulations   ? PT Next Visit Plan Continue with balance and core/LE strengthenin

## 2022-03-10 ENCOUNTER — Encounter: Payer: Self-pay | Admitting: Physical Therapy

## 2022-03-10 ENCOUNTER — Ambulatory Visit: Payer: Medicare Other | Admitting: Physical Therapy

## 2022-03-10 DIAGNOSIS — R2681 Unsteadiness on feet: Secondary | ICD-10-CM

## 2022-03-10 DIAGNOSIS — R262 Difficulty in walking, not elsewhere classified: Secondary | ICD-10-CM | POA: Diagnosis not present

## 2022-03-10 DIAGNOSIS — R2689 Other abnormalities of gait and mobility: Secondary | ICD-10-CM

## 2022-03-10 NOTE — Therapy (Signed)
[facility] ?[place], [place], [address] ?Phone: [phone]   Fax:  [phone] ? ?Physical Therapy Treatment/ Physical Therapy Progress Note ? ? ?Dates of reporting period  02/02/22   to   03/10/22 ? ? ? ?Patient Details  ?Name: Kristina Rowe ?MRN: [id number] ?Date of Birth: Nov 14, 1930 ?Referring Provider (PT): Dr. Tressia Miners ? ? ?Encounter Date: 03/10/2022 ? ? PT End of Session - 03/10/22 1151   ? ? Visit Number 10   ? Number of Visits 17   ? Date for PT Re-Evaluation 03/30/22   ? Authorization Type eval: 02/02/22   ? Progress Note Due on Visit 10   ? PT Start Time 1148   ? PT Stop Time 1228   ? PT Time Calculation (min) 40 min   ? Equipment Utilized During Treatment Gait belt   ? Activity Tolerance Patient tolerated treatment well   ? Behavior During Therapy [facility] for tasks assessed/performed   ? ?  ?  ? ?  ? ? ?Past Medical History:  ?Diagnosis Date  ? Ductal carcinoma in situ (DCIS) of left breast   ? Dysphagia   ? GERD (gastroesophageal reflux disease)   ? Hiatal hernia   ? Hypertension   ? Iron deficiency anemia   ? Osteoporosis   ? Pulmonary embolism ([place])   ? Scoliosis   ? UTI (urinary tract infection)   ? ? ?Past Surgical History:  ?Procedure Laterality Date  ? BREAST LUMPECTOMY Left 2009  ? Ductal Carcinoma insitu   ? CATARACT EXTRACTION, BILATERAL    ? COLONOSCOPY N/A 03/17/2021  ? Procedure: COLONOSCOPY;  Surgeon: Lesly Rubenstein, MD;  Location: [facility] ENDOSCOPY;  Service: Endoscopy;  Laterality: N/A;  ? COLOSTOMY REVISION Right 03/18/2021  ? Procedure: COLON RESECTION RIGHT;  Surgeon: Olean Ree, MD;  Location: [facility] ORS;  Service: General;  Laterality: Right;  ? LAPAROSCOPIC PARAESOPHAGEAL HERNIA REPAIR  2009  ? LAPAROTOMY N/A 03/10/2021  ? Procedure: EXPLORATORY LAPAROTOMY;  Surgeon: Olean Ree, MD;  Location: [facility] ORS;  Service: General;  Laterality: N/A;  ? TUBAL LIGATION    ? ? ?There were no vitals filed for this visit. ? ?  Subjective Assessment - 03/10/22 1150   ? ? Subjective Pt reports feeling muscular soresess last night following therapy but she is feeling better now. Pt reports continued improvement in LBP. Pt also reports continued improvement in LE function with activities such as sit to and from stand as well as floor to stand transitions with chair.   ? Pertinent History Pt underwent "2 major surgies" last April and reports a slow deterioration in her balance and strength since that time. She is fearful of falling and if she stands for an extended period of time her legs feel weak.  Pt goes to the [facility] for exercise twice/week. No falls in the last 6-12 months. She uses a single point cane in her RUE to help with her balance during ambulation. Patient lives in a single story townhome (one step to enter) independently. She has a walk-in shower with a seat and grab bars. She has chronic L low back pain and uses topical patches to manage. She takes Warfarin so she can only use Tylenol to help with her pain. She was recently started on low-dose Norvasc due to hypertension which has been effective at lowering her blood pressure readings.   ? Diagnostic tests None reported   ? Patient Stated Goals Improve balance and decrease  risk for falls   ? Currently in Pain? Yes   ? Pain Score 3    ? Pain Location Back   ? Pain Orientation Mid   ? Pain Descriptors / Indicators Aching;Sore   ? Pain Onset More than a month ago   ? ?  ?  ? ?  ? ? ? ? ? [facility] PT Assessment - 03/10/22 0001   ? ?  ? Berg Balance Test  ? Sit to Stand Able to stand  independently using hands   ? Standing Unsupported Able to stand safely 2 minutes   ? Sitting with Back Unsupported but Feet Supported on Floor or Stool Able to sit safely and securely 2 minutes   ? Stand to Sit Controls descent by using hands   ? Transfers Able to transfer safely, definite need of hands   ? Standing Unsupported with Eyes Closed Able to stand 10 seconds with supervision   ?  Standing Unsupported with Feet Together Able to place feet together independently and stand for 1 minute with supervision   ? From Standing, Reach Forward with Outstretched Arm Can reach forward >12 cm safely (5")   ? From Standing Position, Pick up Object from Floor Able to pick up shoe, needs supervision   ? From Standing Position, Turn to Look Behind Over each Shoulder Turn sideways only but maintains balance   ? Turn 360 Degrees Able to turn 360 degrees safely but slowly   ? Standing Unsupported, Alternately Place Feet on Step/Stool Able to stand independently and complete 8 steps >20 seconds   ? Standing Unsupported, One Foot in Front Able to plae foot ahead of the other independently and hold 30 seconds   ? Standing on One Leg Tries to lift leg/unable to hold 3 seconds but remains standing independently   ? Total Score 40   ? ?  ?  ? ?  ? ?5 X STS 22.8 s with UE on seat of chair. Improvement from inability to complete without UE on arms of chair, still at risk of falls based on score  ? ?10MWT: .79ms, improvement from previous test, still progressing toward goal, performed with RW  ? ?TUG: using RWL 18.48 s, decreased from previous test, did perform at end of session when fatigue may have been a factor  ? ?Physical therapy treatment session today consisted of completing assessment of goals and administration of testing as demonstrated in flow sheet and in goals section of this exam. Addition treatments may be found below.  ? ? ? ? ? ? ? ? ? ? ? ? ? ? ? ? ? ? ? ? ? ? ? PT Education - 03/10/22 1308   ? ? Education Details progress with therapy   ? Person(s) Educated Patient   ? Methods Explanation   ? Comprehension Verbalized understanding   ? ?  ?  ? ?  ? ? ? PT Short Term Goals - 03/10/22 1152   ? ?  ? PT SHORT TERM GOAL #1  ? Title Pt will be independent with HEP in order to improve strength and balance in order to decrease fall risk and improve function at home.   ? Baseline Pt is completing HEP  independently a tthis time.   ? Time 4   ? Period Weeks   ? Status New   ? Target Date 03/02/22   ?  ? PT SHORT TERM GOAL #2  ? Title Pt will improve BERG by at least 3  points in order to demonstrate clinically significant improvement in balance.   ? Baseline 02/02/22: 37/56 03/10/22: 40/56   ? Time 4   ? Period Weeks   ? Status Achieved   ? Target Date 03/02/22   ? ?  ?  ? ?  ? ? ? ? PT Long Term Goals - 03/10/22 1206   ? ?  ? PT LONG TERM GOAL #1  ? Title Pt will improve BERG to at least 43/56 in order to demonstrate clinically significant improvement in balance and decreased risk for falls   ? Baseline 02/02/22: 37/56 4/13: 40   ? Time 8   ? Period Weeks   ? Status New   ? Target Date 03/30/22   ?  ? PT LONG TERM GOAL #2  ? Title Pt will increase self-selected 10MWT by at least 0.13 m/s in order to demonstrate clinically significant improvement in community ambulation.   ? Baseline 02/02/22: self-selected: 14.7s = 0.68 m/s 4/13 . 73 m/s   ? Time 8   ? Period Weeks   ? Status New   ? Target Date 03/30/22   ?  ? PT LONG TERM GOAL #3  ? Title Pt will be able to perform sit to stand x 5 from regular height chair without UE use in order to improvement independence with transfers and decrease her risk for falls   ? Baseline 02/02/22: unable to perform 1 repetition, 4/13: 22.8 with UE on surface of chair   ? Time 8   ? Period Weeks   ? Status On-going   ? Target Date 03/30/22   ?  ? PT LONG TERM GOAL #4  ? Title Pt will decrease TUG to below 12 seconds in order to demonstrate decreased fall risk and improved functional mobility   ? Baseline 02/02/22: 14.8s 4/13: 18.48 sec   ? Time 8   ? Period Weeks   ? Status New   ? Target Date 03/30/22   ? ?  ?  ? ?  ? ? ? ? ? ? ? ? Plan - 03/10/22 1151   ? ? Clinical Impression Statement Patient presents to physical therapy for progress note.  Patient has made significant improvement with her Berg balance test as well as her 5 times sit to stand test making good progress toward these goals.   Both of these indicating decreased risk of falls.  In addition patient also reports significant improvement in her level of function and completing activities within her home and has been completing her home exercises daily

## 2022-03-15 ENCOUNTER — Ambulatory Visit: Payer: Medicare Other | Admitting: Physical Therapy

## 2022-03-15 ENCOUNTER — Encounter: Payer: Self-pay | Admitting: Physical Therapy

## 2022-03-15 DIAGNOSIS — R262 Difficulty in walking, not elsewhere classified: Secondary | ICD-10-CM | POA: Diagnosis not present

## 2022-03-15 DIAGNOSIS — R2681 Unsteadiness on feet: Secondary | ICD-10-CM

## 2022-03-15 DIAGNOSIS — R2689 Other abnormalities of gait and mobility: Secondary | ICD-10-CM

## 2022-03-15 NOTE — Therapy (Signed)
[facility] ?[place], [place], [address] ?Phone: [phone]   Fax:  [phone] ? ?Physical Therapy Treatment ? ?Patient Details  ?Name: Kristina Rowe ?MRN: [id number] ?Date of Birth: Feb 04, 1930 ?Referring Provider (PT): Dr. Tressia Miners ? ? ?Encounter Date: 03/15/2022 ? ? PT End of Session - 03/15/22 1425   ? ? Visit Number 11   ? Number of Visits 17   ? Date for PT Re-Evaluation 03/30/22   ? Authorization Type eval: 02/02/22   ? Progress Note Due on Visit 10   ? PT Start Time 1428   ? PT Stop Time 1515   ? PT Time Calculation (min) 47 min   ? Equipment Utilized During Treatment Gait belt   ? Activity Tolerance Patient tolerated treatment well   ? Behavior During Therapy [facility] for tasks assessed/performed   ? ?  ?  ? ?  ? ? ?Past Medical History:  ?Diagnosis Date  ? Ductal carcinoma in situ (DCIS) of left breast   ? Dysphagia   ? GERD (gastroesophageal reflux disease)   ? Hiatal hernia   ? Hypertension   ? Iron deficiency anemia   ? Osteoporosis   ? Pulmonary embolism ([place])   ? Scoliosis   ? UTI (urinary tract infection)   ? ? ?Past Surgical History:  ?Procedure Laterality Date  ? BREAST LUMPECTOMY Left 2009  ? Ductal Carcinoma insitu   ? CATARACT EXTRACTION, BILATERAL    ? COLONOSCOPY N/A 03/17/2021  ? Procedure: COLONOSCOPY;  Surgeon: Lesly Rubenstein, MD;  Location: [facility] ENDOSCOPY;  Service: Endoscopy;  Laterality: N/A;  ? COLOSTOMY REVISION Right 03/18/2021  ? Procedure: COLON RESECTION RIGHT;  Surgeon: Olean Ree, MD;  Location: [facility] ORS;  Service: General;  Laterality: Right;  ? LAPAROSCOPIC PARAESOPHAGEAL HERNIA REPAIR  2009  ? LAPAROTOMY N/A 03/10/2021  ? Procedure: EXPLORATORY LAPAROTOMY;  Surgeon: Olean Ree, MD;  Location: [facility] ORS;  Service: General;  Laterality: N/A;  ? TUBAL LIGATION    ? ? ?There were no vitals filed for this visit. ? ? Subjective Assessment - 03/15/22 1424   ? ? Subjective Pt reports starting to walk without AD  at home and with walker for community ambulation. Pt also reports getting to see her grand daughter soon.   ? Pertinent History Pt underwent "2 major surgies" last April and reports a slow deterioration in her balance and strength since that time. She is fearful of falling and if she stands for an extended period of time her legs feel weak.  Pt goes to the [facility] for exercise twice/week. No falls in the last 6-12 months. She uses a single point cane in her RUE to help with her balance during ambulation. Patient lives in a single story townhome (one step to enter) independently. She has a walk-in shower with a seat and grab bars. She has chronic L low back pain and uses topical patches to manage. She takes Warfarin so she can only use Tylenol to help with her pain. She was recently started on low-dose Norvasc due to hypertension which has been effective at lowering her blood pressure readings.   ? Diagnostic tests None reported   ? Patient Stated Goals Improve balance and decrease risk for falls   ? Pain Onset More than a month ago   ? ?  ?  ? ?  ? ? ? ? ?INTERVENTIONS: ? ? ? ? ?  ?Exercise/Activity Sets/ Reps/Time/ Resistance Assistance Charge type Comments  Unless otherwise stated, CGA was provided and gait belt donned in order to ensure pt safety ?   ?Ball roll outs  5 x 5 sec  therex To improve low back mobility and pain prior to other strengthening activities   ?Hip march 2.5 lb. AW with postural cues   10 reps each LE ?2.5 lb   Therex VC to keep core engaged  and for proper breathing.   ?HS curl with core activation w/ postural focus  2 x 10 x RTB   therex Cues for upright posture   ?STS  3*6 CGA Therex UE support on on base of chair but not on chair arms   ?Seated Side step over 1/2 FR  X 10 reps ?2.5 lb  Therex  At Support bar, cues for wide BOS and large steps   ?LAQ - postural focus  2 x 10  reps x 3 #   Therex    ?1/2 FR rocks  ?1/2 FR stance  X 30 ?2 x 45 sec  UE ?Intermittent UE, CGA  NMR  Improved stability with intermittent UE  ?Cues for proper positioning   ?Airex adducted stance  3 x 45 sec  ? Intermittent UE, CGA  NMR  Good stability noted, fatigue noted toward last repetition    ?           ?           ?Treatment provided this session ?  ?  ?Pt educated throughout session about proper posture and technique with exercises. Improved exercise technique, movement at target joints, use of target muscles after min to mod verbal, visual, tactile cues. ?  ? ? ? ? ? ? ? ? ? ? ? ? ? ? ? ? ? ? ? ? ? ? ? ? ? ? ? ? ? PT Education - 03/15/22 1425   ? ? Education Details Progress with therapy   ? Person(s) Educated Patient   ? Methods Explanation;Demonstration   ? Comprehension Verbalized understanding;Returned demonstration   ? ?  ?  ? ?  ? ? ? PT Short Term Goals - 03/10/22 1152   ? ?  ? PT SHORT TERM GOAL #1  ? Title Pt will be independent with HEP in order to improve strength and balance in order to decrease fall risk and improve function at home.   ? Baseline Pt is completing HEP independently a tthis time.   ? Time 4   ? Period Weeks   ? Status New   ? Target Date 03/02/22   ?  ? PT SHORT TERM GOAL #2  ? Title Pt will improve BERG by at least 3 points in order to demonstrate clinically significant improvement in balance.   ? Baseline 02/02/22: 37/56 03/10/22: 40/56   ? Time 4   ? Period Weeks   ? Status Achieved   ? Target Date 03/02/22   ? ?  ?  ? ?  ? ? ? ? PT Long Term Goals - 03/10/22 1206   ? ?  ? PT LONG TERM GOAL #1  ? Title Pt will improve BERG to at least 43/56 in order to demonstrate clinically significant improvement in balance and decreased risk for falls   ? Baseline 02/02/22: 37/56 4/13: 40   ? Time 8   ? Period Weeks   ? Status New   ? Target Date 03/30/22   ?  ? PT LONG TERM GOAL #2  ? Title Pt will increase self-selected  10MWT by at least 0.13 m/s in order to demonstrate clinically significant improvement in community ambulation.   ? Baseline 02/02/22: self-selected: 14.7s = 0.68 m/s 4/13 . 73  m/s   ? Time 8   ? Period Weeks   ? Status New   ? Target Date 03/30/22   ?  ? PT LONG TERM GOAL #3  ? Title Pt will be able to perform sit to stand x 5 from regular height chair without UE use in order to improvement independence with transfers and decrease her risk for falls   ? Baseline 02/02/22: unable to perform 1 repetition, 4/13: 22.8 with UE on surface of chair   ? Time 8   ? Period Weeks   ? Status On-going   ? Target Date 03/30/22   ?  ? PT LONG TERM GOAL #4  ? Title Pt will decrease TUG to below 12 seconds in order to demonstrate decreased fall risk and improved functional mobility   ? Baseline 02/02/22: 14.8s 4/13: 18.48 sec   ? Time 8   ? Period Weeks   ? Status New   ? Target Date 03/30/22   ? ?  ?  ? ?  ? ? ? ? ? ? ? ? Plan - 03/15/22 1425   ? ? Clinical Impression Statement Pt pleasant and presents with excellent motivation for completino of PT interventions. Pt continues to progress with static and dynamic balance interventions this session. Pt also continues to progress with LE strengthening in combination with exercises to improve her postural strength. Pt will continue to benefit from skilled PT services in order to improve her balance, strength, function and QOL.   ? Personal Factors and Comorbidities Age;Comorbidity 3+   ? Comorbidities anemia, HTN, aortic stenosis, chronic back pain   ? Examination-Activity Limitations Bathing;Bend;Squat;Stairs;Stand;Transfers   ? Examination-Participation Restrictions Community Activity;Shop;Meal Prep   ? Stability/Clinical Decision Making Evolving/Moderate complexity   ? Rehab Potential Good   ? PT Frequency 2x / week   ? PT Duration 8 weeks   ? PT Treatment/Interventions ADLs/Self Care Home Management;Aquatic Therapy;Canalith Repostioning;Cryotherapy;Electrical Stimulation;Moist Heat;Iontophoresis '4mg'$ /ml Dexamethasone;Traction;Ultrasound;Gait training;Stair training;Functional mobility training;Therapeutic activities;Therapeutic exercise;Balance  training;Neuromuscular re-education;Cognitive remediation;Patient/family education;Manual techniques;Passive range of motion;Dry needling;Vestibular;Spinal Manipulations;Joint Manipulations   ? PT Next Visit Plan Continue w

## 2022-03-17 ENCOUNTER — Encounter: Payer: Self-pay | Admitting: Physical Therapy

## 2022-03-17 ENCOUNTER — Ambulatory Visit: Payer: Medicare Other | Admitting: Physical Therapy

## 2022-03-17 DIAGNOSIS — R2681 Unsteadiness on feet: Secondary | ICD-10-CM

## 2022-03-17 DIAGNOSIS — R262 Difficulty in walking, not elsewhere classified: Secondary | ICD-10-CM | POA: Diagnosis not present

## 2022-03-17 DIAGNOSIS — M6281 Muscle weakness (generalized): Secondary | ICD-10-CM

## 2022-03-17 DIAGNOSIS — R2689 Other abnormalities of gait and mobility: Secondary | ICD-10-CM

## 2022-03-17 NOTE — Therapy (Signed)
[place] ?[place], [place], [address] ?Phone: [phone]   Fax:  [phone] ? ?Physical Therapy Treatment ? ?Patient Details  ?Name: Kristina Rowe ?MRN: [id number] ?Date of Birth: Jun 04, 1930 ?Referring Provider (PT): Dr. Tressia Miners ? ? ?Encounter Date: 03/17/2022 ? ? PT End of Session - 03/17/22 1257   ? ? Visit Number 12   ? Number of Visits 17   ? Date for PT Re-Evaluation 03/30/22   ? Authorization Type eval: 02/02/22   ? Progress Note Due on Visit 20   ? PT Start Time 1147   ? PT Stop Time 1228   ? PT Time Calculation (min) 41 min   ? Equipment Utilized During Treatment Gait belt   ? Activity Tolerance Patient tolerated treatment well   ? Behavior During Therapy [facility] for tasks assessed/performed   ? ?  ?  ? ?  ? ? ?Past Medical History:  ?Diagnosis Date  ? Ductal carcinoma in situ (DCIS) of left breast   ? Dysphagia   ? GERD (gastroesophageal reflux disease)   ? Hiatal hernia   ? Hypertension   ? Iron deficiency anemia   ? Osteoporosis   ? Pulmonary embolism ([place])   ? Scoliosis   ? UTI (urinary tract infection)   ? ? ?Past Surgical History:  ?Procedure Laterality Date  ? BREAST LUMPECTOMY Left 2009  ? Ductal Carcinoma insitu   ? CATARACT EXTRACTION, BILATERAL    ? COLONOSCOPY N/A 03/17/2021  ? Procedure: COLONOSCOPY;  Surgeon: Lesly Rubenstein, MD;  Location: [facility] ENDOSCOPY;  Service: Endoscopy;  Laterality: N/A;  ? COLOSTOMY REVISION Right 03/18/2021  ? Procedure: COLON RESECTION RIGHT;  Surgeon: Olean Ree, MD;  Location: [facility] ORS;  Service: General;  Laterality: Right;  ? LAPAROSCOPIC PARAESOPHAGEAL HERNIA REPAIR  2009  ? LAPAROTOMY N/A 03/10/2021  ? Procedure: EXPLORATORY LAPAROTOMY;  Surgeon: Olean Ree, MD;  Location: [facility] ORS;  Service: General;  Laterality: N/A;  ? TUBAL LIGATION    ? ? ?There were no vitals filed for this visit. ? ? Subjective Assessment - 03/17/22 1255   ? ? Subjective Pt reports starting to walk without AD  at home and with walker for community ambulation. Pt reports she had some back soreness following her previous visit.   ? Pertinent History Pt underwent "2 major surgies" last April and reports a slow deterioration in her balance and strength since that time. She is fearful of falling and if she stands for an extended period of time her legs feel weak.  Pt goes to the [facility] for exercise twice/week. No falls in the last 6-12 months. She uses a single point cane in her RUE to help with her balance during ambulation. Patient lives in a single story townhome (one step to enter) independently. She has a walk-in shower with a seat and grab bars. She has chronic L low back pain and uses topical patches to manage. She takes Warfarin so she can only use Tylenol to help with her pain. She was recently started on low-dose Norvasc due to hypertension which has been effective at lowering her blood pressure readings.   ? Diagnostic tests None reported   ? Patient Stated Goals Improve balance and decrease risk for falls   ? Currently in Pain? Yes   ? Pain Score 4    ? Pain Location Back   ? Pain Orientation Mid   ? Pain Descriptors / Indicators Aching;Sore   ? Pain  Onset More than a month ago   ? Pain Frequency Constant   ? ?  ?  ? ?  ? ? ?Exercise/Activity Sets/ Reps/Time/ Resistance Assistance Charge type Comments Unless otherwise stated, CGA was provided and gait belt donned in order to ensure pt safety ?   ?Ball roll outs 3 way 5 x 5 sec  therex To improve low back mobility and pain prior to other strengthening activities   ?Standing march  10 reps each LE ?2.5 lb   Therex VC to keep core engaged  and for proper breathing.   ?HS curl  2 x 10 x RTB  ?X 10 standing ea side  therex Cues for upright posture   ?STS  *10 CGA Therex UE support on on base of chair but not on chair arms   ?Seated Side step over 1/2 FR  X 10 reps ?2.5 lb  Therex  At Support bar, cues for wide BOS and large steps   ?LAQ - postural focus  2 x 10   reps x 3 #   Therex    ?1/2 FR rocks  ?1/2 FR stance  X 30 ?2 x 45 sec  UE ?Intermittent UE, CGA  NMR Improved stability with intermittent UE  ?Cues for proper positioning   ?Airex adducted stance  2 x 30 sec  ? Intermittent UE, CGA  NMR  Good stability noted, fatigue noted toward last repetition    ?           ?           ?Treatment provided this session ?  ?  ?Pt educated throughout session about proper posture and technique with exercises. Improved exercise technique, movement at target joints, use of target muscles after min to mod verbal, visual, tactile cues. ?  ? ? ? ? ? ? ? ? ? ? ? ? ? ? ? ? ? ? ? ? ? ? ? ? ? ? ? ? ? PT Education - 03/17/22 1256   ? ? Education Details exercise technique   ? Person(s) Educated Patient   ? Methods Explanation   ? Comprehension Verbalized understanding   ? ?  ?  ? ?  ? ? ? PT Short Term Goals - 03/10/22 1152   ? ?  ? PT SHORT TERM GOAL #1  ? Title Pt will be independent with HEP in order to improve strength and balance in order to decrease fall risk and improve function at home.   ? Baseline Pt is completing HEP independently a tthis time.   ? Time 4   ? Period Weeks   ? Status New   ? Target Date 03/02/22   ?  ? PT SHORT TERM GOAL #2  ? Title Pt will improve BERG by at least 3 points in order to demonstrate clinically significant improvement in balance.   ? Baseline 02/02/22: 37/56 03/10/22: 40/56   ? Time 4   ? Period Weeks   ? Status Achieved   ? Target Date 03/02/22   ? ?  ?  ? ?  ? ? ? ? PT Long Term Goals - 03/10/22 1206   ? ?  ? PT LONG TERM GOAL #1  ? Title Pt will improve BERG to at least 43/56 in order to demonstrate clinically significant improvement in balance and decreased risk for falls   ? Baseline 02/02/22: 37/56 4/13: 40   ? Time 8   ? Period Weeks   ? Status New   ?  Target Date 03/30/22   ?  ? PT LONG TERM GOAL #2  ? Title Pt will increase self-selected 10MWT by at least 0.13 m/s in order to demonstrate clinically significant improvement in community ambulation.    ? Baseline 02/02/22: self-selected: 14.7s = 0.68 m/s 4/13 . 73 m/s   ? Time 8   ? Period Weeks   ? Status New   ? Target Date 03/30/22   ?  ? PT LONG TERM GOAL #3  ? Title Pt will be able to perform sit to stand x 5 from regular height chair without UE use in order to improvement independence with transfers and decrease her risk for falls   ? Baseline 02/02/22: unable to perform 1 repetition, 4/13: 22.8 with UE on surface of chair   ? Time 8   ? Period Weeks   ? Status On-going   ? Target Date 03/30/22   ?  ? PT LONG TERM GOAL #4  ? Title Pt will decrease TUG to below 12 seconds in order to demonstrate decreased fall risk and improved functional mobility   ? Baseline 02/02/22: 14.8s 4/13: 18.48 sec   ? Time 8   ? Period Weeks   ? Status New   ? Target Date 03/30/22   ? ?  ?  ? ?  ? ? ? ? ? ? ? ? Plan - 03/17/22 1257   ? ? Clinical Impression Statement Pt pleasant and presents with excellent motivation for completino of PT interventions. Pt continues to progress with static and dynamic balance interventions this session. PT did limit some postural strengthening secondary to soignificant soreness and pain following the last session.  Patient continues to progress well with her sit to stand activities indicating continued improvement in lower extremity strength.  Pt will continue to benefit from skilled PT services in order to improve her balance, strength, function and QOL.   ? Personal Factors and Comorbidities Age;Comorbidity 3+   ? Comorbidities anemia, HTN, aortic stenosis, chronic back pain   ? Examination-Activity Limitations Bathing;Bend;Squat;Stairs;Stand;Transfers   ? Examination-Participation Restrictions Community Activity;Shop;Meal Prep   ? Stability/Clinical Decision Making Evolving/Moderate complexity   ? Rehab Potential Good   ? PT Frequency 2x / week   ? PT Duration 8 weeks   ? PT Treatment/Interventions ADLs/Self Care Home Management;Aquatic Therapy;Canalith Repostioning;Cryotherapy;Electrical  Stimulation;Moist Heat;Iontophoresis '4mg'$ /ml Dexamethasone;Traction;Ultrasound;Gait training;Stair training;Functional mobility training;Therapeutic activities;Therapeutic exercise;Balance training;Neuromuscular

## 2022-03-18 ENCOUNTER — Ambulatory Visit (INDEPENDENT_AMBULATORY_CARE_PROVIDER_SITE_OTHER): Payer: Medicare Other | Admitting: Surgery

## 2022-03-18 ENCOUNTER — Encounter: Payer: Self-pay | Admitting: Surgery

## 2022-03-18 VITALS — BP 161/83 | HR 81 | Ht <= 58 in | Wt 93.6 lb

## 2022-03-18 DIAGNOSIS — C182 Malignant neoplasm of ascending colon: Secondary | ICD-10-CM | POA: Diagnosis not present

## 2022-03-18 NOTE — Patient Instructions (Signed)
If you have any concerns or questions, please feel free to call our office.   Exploratory Laparotomy, Adult, Care After The following information offers guidance on how to care for yourself after your procedure. Your health care provider may also give you more specific instructions. If you have problems or questions, contact your health care provider. What can I expect after the procedure? After the procedure, it is common to have: Abdominal soreness. Fatigue. Bloating. Gas. A sore throat from having had a breathing or draining tube in your throat. A lack of appetite. Follow these instructions at home: Medicines Take over-the-counter and prescription medicines only as told by your health care provider. If you were prescribed an antibiotic medicine, take it as told by your health care provider. Do not stop taking the antibiotic even if you start to feel better. Ask your health care provider if the medicine prescribed to you: Requires you to avoid driving or using machinery. Can cause constipation. You may need to take these actions to prevent or treat constipation: Drink enough fluid to keep your urine pale yellow. Take over-the-counter or prescription medicines. Undergoing surgery and taking pain medicines can make constipation worse. Eat foods that are high in fiber, such as beans, whole grains, and fresh fruits and vegetables. Limit foods that are high in fat and processed sugars, such as fried or sweet foods. Incision care  Follow instructions from your health care provider about how to take care of your incision. Make sure you: Wash your hands with soap and water for at least 20 seconds before and after you change your bandage (dressing). If soap and water are not available, use hand sanitizer. Change your dressing as told by your health care provider. Leave stitches (sutures), skin glue, or adhesive strips in place. These skin closures may need to stay in place for 2 weeks or longer.  If adhesive strip edges start to loosen and curl up, you may trim the loose edges. Do not remove adhesive strips completely unless your health care provider tells you to do that. If you were sent home with a drain, follow instructions from your health care provider about how to care for it. Check your incision area every day for signs of infection. Check for: Redness, swelling, or pain. Fluid or blood. Warmth. Pus or a bad smell. Activity  Rest as told by your health care provider. Avoid sitting for a long time without moving. Get up to take short walks every 1-2 hours. This is important to improve blood flow and breathing. Ask for help if you feel weak or unsteady. Do not lift anything that is heavier than 5 lb (2.3 kg), or the limit that you are told, until your health care provider says that it is safe. Return to your normal activities as told by your health care provider. Ask your health care provider what activities are safe for you. Bathing Keep your incision clean and dry. Clean it as often as told by your health care provider. You may be told to: Gently wash the incision with soap and water. Rinse the incision with water to remove all soap. Pat the incision dry with a clean towel. Do not rub the incision. General instructions Do not use any products that contain nicotine or tobacco. These products include cigarettes, chewing tobacco, and vaping devices, such as e-cigarettes. These can delay incision healing after surgery. If you need help quitting, ask your health care provider. Wear compression stockings as told by your health care provider. These   stockings help to prevent blood clots and reduce swelling in your legs. Keep all follow-up visits. This is important. Contact a health care provider if: You have a fever or chills. Your pain medicine is not helping. You have constipation or diarrhea. You have nausea or vomiting. You have drainage, redness, swelling, or pain at your  incision site. Get help right away if: Your pain is getting worse. You have not had a bowel movement for more than 3 days. You have ongoing (persistent) vomiting. The edges of your incision open up. You have warmth, tenderness, or swelling in your calf. You have trouble breathing. You have chest pain. These symptoms may represent a serious problem that is an emergency. Do not wait to see if the symptoms will go away. Get medical help right away. Call your local emergency services (911 in the [place] Do not drive yourself to the hospital. Summary Abdominal soreness is common after exploratory laparotomy. Take over-the-counter and prescription medicines only as told by your health care provider. Follow instructions from your health care provider about how to take care of your incision. Do not lift anything that is heavier than 5 lb (2.3 kg), or the limit that you are told, until your health care provider says that it is safe. This information is not intended to replace advice given to you by your health care provider. Make sure you discuss any questions you have with your health care provider. Document Revised: 07/28/2020 Document Reviewed: 07/28/2020 Elsevier Patient Education  2023 Elsevier Inc.  

## 2022-03-18 NOTE — Progress Notes (Signed)
?03/18/2022 ? ?History of Present Illness: ?Kristina Rowe is a 88 y.o. female presenting for follow-up of right colon cancer.  Patient had initially an exploratory laparotomy for small bowel volvulus on 03/10/2021 and then a week later presented with rectal bleeding and a CT scan showing a right colon mass.  She underwent an open right hemicolectomy on 03/18/2021 with final pathology T3, N1C (stage IIIb) colon cancer.  She is not a candidate for chemotherapy given her age and comorbidities and is currently being followed by Dr. Janese Banks. ? ?More recently, she had a CT scan of the chest, abdomen, pelvis on 11/08/2021 which did not show any evidence of metastatic disease in the abdomen or pelvis but she did have small bilateral pleural effusions.  Overall the patient reports today that she has been feeling well.  She has been working with physical therapy to regain more strength and has been progressing appropriately.  Denies any worsening abdominal pain, nausea, vomiting.  Her last CEA on 01/31/2022 was 1.7. ? ?Past Medical History: ?Past Medical History:  ?Diagnosis Date  ? Ductal carcinoma in situ (DCIS) of left breast   ? Dysphagia   ? GERD (gastroesophageal reflux disease)   ? Hiatal hernia   ? Hypertension   ? Iron deficiency anemia   ? Osteoporosis   ? Pulmonary embolism ([place])   ? Scoliosis   ? UTI (urinary tract infection)   ?  ? ?Past Surgical History: ?Past Surgical History:  ?Procedure Laterality Date  ? BREAST LUMPECTOMY Left 2009  ? Ductal Carcinoma insitu   ? CATARACT EXTRACTION, BILATERAL    ? COLONOSCOPY N/A 03/17/2021  ? Procedure: COLONOSCOPY;  Surgeon: Lesly Rubenstein, MD;  Location: [facility] ENDOSCOPY;  Service: Endoscopy;  Laterality: N/A;  ? COLOSTOMY REVISION Right 03/18/2021  ? Procedure: COLON RESECTION RIGHT;  Surgeon: Olean Ree, MD;  Location: [facility] ORS;  Service: General;  Laterality: Right;  ? LAPAROSCOPIC PARAESOPHAGEAL HERNIA REPAIR  2009  ? LAPAROTOMY N/A 03/10/2021  ? Procedure: EXPLORATORY  LAPAROTOMY;  Surgeon: Olean Ree, MD;  Location: [facility] ORS;  Service: General;  Laterality: N/A;  ? TUBAL LIGATION    ? ? ?Home Medications: ?Prior to Admission medications   ?Medication Sig Start Date End Date Taking? Authorizing Provider  ?acetaminophen (TYLENOL) 500 MG tablet Take 500 mg by mouth at bedtime as needed for mild pain, fever or headache.   Yes [provider]  ?amLODipine (NORVASC) 2.5 MG tablet Take 2.5 mg by mouth daily. 02/07/22  Yes [provider]  ?ascorbic acid (VITAMIN C) 250 MG tablet Take by mouth.   Yes [provider]  ?calcium carbonate (OS-CAL) 1250 (500 Ca) MG chewable tablet Chew 1 tablet by mouth daily. 04/20/09  Yes [provider]  ?Cholecalciferol 50 MCG (2000 UT) CAPS Take by mouth.   Yes [provider]  ?cyanocobalamin 1000 MCG tablet Take 1,000 mcg by mouth daily.   Yes [provider]  ?diclofenac Sodium (VOLTAREN) 1 % GEL Apply topically 4 (four) times daily.   Yes [provider]  ?econazole nitrate 1 % cream Apply 1 application topically daily.   Yes [provider]  ?estradiol (ESTRACE) 0.1 MG/GM vaginal cream Estrogen Cream Instruction Discard applicator Apply pea sized amount to tip of finger to urethra before bed. Wash hands well after application. Use Monday, Wednesday and Friday 03/04/21  Yes Billey Co, MD  ?famotidine (PEPCID) 40 MG tablet Take 40 mg by mouth 2 (two) times daily. 10/24/21  Yes [provider]  ?folic acid (FOLVITE) 1 MG tablet Take 1 mg by mouth daily. 01/17/19  Yes [provider]  ?loperamide (IMODIUM) 2 MG capsule Take 2 mg by mouth as needed for diarrhea or loose stools.   Yes [provider]  ?mirtazapine (REMERON) 15 MG tablet Take 15 mg by mouth at bedtime. 10/08/21  Yes [provider]  ?mirtazapine (REMERON) 30 MG tablet Take 15 mg by mouth at bedtime. 03/02/21  Yes [provider]  ?ondansetron (ZOFRAN ODT) 4 MG  disintegrating tablet Take 1 tablet (4 mg total) by mouth every 8 (eight) hours as needed for nausea or vomiting. 04/30/21  Yes Cassandre Oleksy, Jacqulyn Bath, MD  ?pantoprazole (PROTONIX) 40 MG tablet Take 40 mg by mouth daily.   Yes [provider]  ?promethazine (PHENERGAN) 25 MG tablet Take 25 mg by mouth every 6 (six) hours as needed for nausea or vomiting.   Yes [provider]  ?warfarin (COUMADIN) 2.5 MG tablet On Sundays and Thursdays resume the 2.5 mg dose.  All other days take 5 mg. ?Patient taking differently: Take 2.5-5 mg by mouth daily at 12 noon. 2.5 mg sun Thursday, all other days is '5mg'$  03/14/21  Yes Ronny Bacon, MD  ?warfarin (COUMADIN) 5 MG tablet Take by mouth. 03/26/21  Yes [provider]  ? ? ?Allergies: ?Allergies  ?Allergen Reactions  ? Metronidazole Other (See Comments)  ?  Other reaction(s): Other ?Mouth sores ?Mouth sores ?Mouth sores ?  ? Omeprazole Other (See Comments)  ?  Other reaction(s): Arthralgias (intolerance) ?Other reaction(s): Other ?  ? Bactrim [Sulfamethoxazole-Trimethoprim] Nausea Only  ? Codeine Nausea And Vomiting and Nausea Only  ?  Other reaction(s): Nausea Only ?But tolerates tramadol ?  ? ? ?Review of Systems: ?Review of Systems  ?Constitutional:  Negative for chills and fever.  ?Respiratory:  Negative for shortness of breath.   ?Cardiovascular:  Negative for chest pain.  ?Gastrointestinal:  Negative for abdominal pain, constipation, diarrhea, nausea and vomiting.  ? ?Physical Exam ?BP (!) 161/83   Pulse 81   Ht '4\' 10"'$  (1.473 m)   Wt 93 lb 9.6 oz (42.5 kg)   SpO2 99%   BMI 19.56 kg/m?  ?CONSTITUTIONAL: No acute dress, stable weight. ?HEENT:  Normocephalic, atraumatic, extraocular motion intact. ?RESPIRATORY:  Lungs are clear, and breath sounds are equal bilaterally. Normal respiratory effort without pathologic use of accessory muscles. ?CARDIOVASCULAR: Heart is regular without murmurs, gallops, or rubs. ?GI: The abdomen is soft, nondistended, with  some mild tenderness in the lower abdomen on deeper palpation.  There is no evidence of incisional hernia at her midline incision is well-healed.  ?NEUROLOGIC:  Motor and sensation is grossly normal.  Cranial nerves are grossly intact. ?PSYCH:  Alert and oriented to person, place and time. Affect is normal. ? ?Labs/Imaging: ?CT of C/A/P on 11/08/2021: ?IMPRESSION: ?1. No evidence of metastatic disease in the abdomen or pelvis. ?2. Small bilateral pleural effusions with associated atelectasis, ?new compared to prior exam. ?3. Moderate stool burden. ?4.  Aortic Atherosclerosis (ICD10-I70.0). ? ?CEA on 01/31/2022: 1.7 ? ? ?Assessment and Plan: ?This is a 88 y.o. female with a history of right colon cancer status post open right colectomy. ? ?- Discussed with the patient that her CT scan and laboratory salts showed no evidence of metastatic disease.  Her iron studies have been improving as well.  The patient did have some mild tenderness in the lower abdomen with deep palpation patient reports that she had a UTI recently.  This  could anteriorly be related to inflammation from the UTI.  However I discussed with the patient that if this pain persists or worsens that she should tell us so we can reevaluate her.  There is also stool burden in her colon in the lower abdomen as well so potentially this could be related to some constipation. ?- Patient has a repeat CT scan in June 2023. ?- Otherwise if the patient continues to do well, she can follow-up with me in 6 months. ? ?I spent 25 minutes dedicated to the care of this patient on the date of this encounter to include pre-visit review of records, face-to-face time with the patient discussing diagnosis and management, and any post-visit coordination of care. ? ? ?[name], MD ?[facility] ? ? ?  ?

## 2022-03-22 ENCOUNTER — Ambulatory Visit: Payer: Medicare Other | Admitting: Physical Therapy

## 2022-03-24 ENCOUNTER — Encounter: Payer: Self-pay | Admitting: Physical Therapy

## 2022-03-24 ENCOUNTER — Ambulatory Visit: Payer: Medicare Other | Admitting: Physical Therapy

## 2022-03-24 DIAGNOSIS — M6281 Muscle weakness (generalized): Secondary | ICD-10-CM

## 2022-03-24 DIAGNOSIS — R262 Difficulty in walking, not elsewhere classified: Secondary | ICD-10-CM

## 2022-03-24 DIAGNOSIS — R2689 Other abnormalities of gait and mobility: Secondary | ICD-10-CM

## 2022-03-24 DIAGNOSIS — R2681 Unsteadiness on feet: Secondary | ICD-10-CM

## 2022-03-24 NOTE — Therapy (Signed)
[place] [facility] ?[place], [place], [address] ?Phone: [phone]   Fax:  [phone] ? ?Physical Therapy Treatment ? ?Patient Details  ?Name: Kristina Rowe ?MRN: [id number] ?Date of Birth: Mar 17, 1930 ?Referring Provider (PT): Dr. Tressia Miners ? ? ?Encounter Date: 03/24/2022 ? ? PT End of Session - 03/24/22 1148   ? ? Visit Number 13   ? Number of Visits 17   ? Date for PT Re-Evaluation 03/30/22   ? Authorization Type eval: 02/02/22   ? Progress Note Due on Visit 20   ? PT Start Time 1148   ? PT Stop Time 4917   ? PT Time Calculation (min) 41 min   ? Equipment Utilized During Treatment Gait belt   ? Activity Tolerance Patient tolerated treatment well   ? Behavior During Therapy [facility] for tasks assessed/performed   ? ?  ?  ? ?  ? ? ?Past Medical History:  ?Diagnosis Date  ? Ductal carcinoma in situ (DCIS) of left breast   ? Dysphagia   ? GERD (gastroesophageal reflux disease)   ? Hiatal hernia   ? Hypertension   ? Iron deficiency anemia   ? Osteoporosis   ? Pulmonary embolism ([place])   ? Scoliosis   ? UTI (urinary tract infection)   ? ? ?Past Surgical History:  ?Procedure Laterality Date  ? BREAST LUMPECTOMY Left 2009  ? Ductal Carcinoma insitu   ? CATARACT EXTRACTION, BILATERAL    ? COLONOSCOPY N/A 03/17/2021  ? Procedure: COLONOSCOPY;  Surgeon: Lesly Rubenstein, MD;  Location: [facility] ENDOSCOPY;  Service: Endoscopy;  Laterality: N/A;  ? COLOSTOMY REVISION Right 03/18/2021  ? Procedure: COLON RESECTION RIGHT;  Surgeon: Olean Ree, MD;  Location: [facility] ORS;  Service: General;  Laterality: Right;  ? LAPAROSCOPIC PARAESOPHAGEAL HERNIA REPAIR  2009  ? LAPAROTOMY N/A 03/10/2021  ? Procedure: EXPLORATORY LAPAROTOMY;  Surgeon: Olean Ree, MD;  Location: [facility] ORS;  Service: General;  Laterality: N/A;  ? TUBAL LIGATION    ? ? ?There were no vitals filed for this visit. ? ? Subjective Assessment - 03/24/22 1335   ? ? Subjective Pt reports feeling better since her her  stomach was upset earlier in the week. No falls or LOB since last visit.   ? Pertinent History Pt underwent "2 major surgies" last April and reports a slow deterioration in her balance and strength since that time. She is fearful of falling and if she stands for an extended period of time her legs feel weak.  Pt goes to the [facility] for exercise twice/week. No falls in the last 6-12 months. She uses a single point cane in her RUE to help with her balance during ambulation. Patient lives in a single story townhome (one step to enter) independently. She has a walk-in shower with a seat and grab bars. She has chronic L low back pain and uses topical patches to manage. She takes Warfarin so she can only use Tylenol to help with her pain. She was recently started on low-dose Norvasc due to hypertension which has been effective at lowering her blood pressure readings.   ? Diagnostic tests None reported   ? Patient Stated Goals Improve balance and decrease risk for falls   ? Pain Onset More than a month ago   ? ?  ?  ? ?  ? ? ?Exercise/Activity Sets/ Reps/Time/ Resistance Assistance Charge type Comments Unless otherwise stated, CGA was provided and gait belt donned in order to  ensure pt safety ?   ?Ball roll outs 3 way 5 x 5 sec  therex To improve low back mobility and pain prior to other strengthening activities   ?Standing march  *10 reps each LE ?3 lb   Therex VC to keep core engaged  and for proper breathing.   ?HS curl standing with 3# AW  *10 ea side  therex Cues for upright posture   ?STS  3*6 CGA Therex UE support on on base of chair but not on chair arms   ?Seated Side step over 1/2 FR  X 10 reps ?2.5 lb  Therex  At Support bar, cues for wide BOS and large steps   ?LAQ - postural focus  2 x 10  reps x 3 #   Therex    ?1/2 FR rocks  ?1/2 FR stance  X 30 ?2 x 45 sec  UE ?Intermittent UE, CGA  NMR Improved stability with intermittent UE  ?Cues for proper positioning   ?Airex adducted stance  2 x 30 sec  ?  Intermittent UE, CGA  NMR  Good stability noted, fatigue noted toward last repetition    ?Hip extension standing  X 10 ea with 3# AW  UE  Therex   improved motion with increased reps   ?           ?Treatment provided this session ?  ?  ?Pt educated throughout session about proper posture and technique with exercises. Improved exercise technique, movement at target joints, use of target muscles after min to mod verbal, visual, tactile cues. ?  ? ? ? ? ? ? ? ? ? ? ? ? ? ? ? ? ? ? ? ? ? ? ? ? ? ? ? PT Education - 03/24/22 1148   ? ? Education Details Exercise technique   ? Person(s) Educated Patient   ? Methods Explanation   ? Comprehension Verbalized understanding   ? ?  ?  ? ?  ? ? ? PT Short Term Goals - 03/10/22 1152   ? ?  ? PT SHORT TERM GOAL #1  ? Title Pt will be independent with HEP in order to improve strength and balance in order to decrease fall risk and improve function at home.   ? Baseline Pt is completing HEP independently a tthis time.   ? Time 4   ? Period Weeks   ? Status New   ? Target Date 03/02/22   ?  ? PT SHORT TERM GOAL #2  ? Title Pt will improve BERG by at least 3 points in order to demonstrate clinically significant improvement in balance.   ? Baseline 02/02/22: 37/56 03/10/22: 40/56   ? Time 4   ? Period Weeks   ? Status Achieved   ? Target Date 03/02/22   ? ?  ?  ? ?  ? ? ? ? PT Long Term Goals - 03/10/22 1206   ? ?  ? PT LONG TERM GOAL #1  ? Title Pt will improve BERG to at least 43/56 in order to demonstrate clinically significant improvement in balance and decreased risk for falls   ? Baseline 02/02/22: 37/56 4/13: 40   ? Time 8   ? Period Weeks   ? Status New   ? Target Date 03/30/22   ?  ? PT LONG TERM GOAL #2  ? Title Pt will increase self-selected 10MWT by at least 0.13 m/s in order to demonstrate clinically significant improvement in community ambulation.   ?  Baseline 02/02/22: self-selected: 14.7s = 0.68 m/s 4/13 . 73 m/s   ? Time 8   ? Period Weeks   ? Status New   ? Target Date  03/30/22   ?  ? PT LONG TERM GOAL #3  ? Title Pt will be able to perform sit to stand x 5 from regular height chair without UE use in order to improvement independence with transfers and decrease her risk for falls   ? Baseline 02/02/22: unable to perform 1 repetition, 4/13: 22.8 with UE on surface of chair   ? Time 8   ? Period Weeks   ? Status On-going   ? Target Date 03/30/22   ?  ? PT LONG TERM GOAL #4  ? Title Pt will decrease TUG to below 12 seconds in order to demonstrate decreased fall risk and improved functional mobility   ? Baseline 02/02/22: 14.8s 4/13: 18.48 sec   ? Time 8   ? Period Weeks   ? Status New   ? Target Date 03/30/22   ? ?  ?  ? ?  ? ? ? ? ? ? ? ? Plan - 03/24/22 1149   ? ? Clinical Impression Statement Pt pleasant and presents with excellent motivation for completion of PT interventions. Pt continues to progress with static and dynamic balance interventions this session. Patient continues to progress well with her sit to stand activities indicating continued improvement in lower extremity strength. Pt will continue to benefit from skilled PT services in order to improve her balance, strength, function and QOL.   ? Personal Factors and Comorbidities Age;Comorbidity 3+   ? Comorbidities anemia, HTN, aortic stenosis, chronic back pain   ? Examination-Activity Limitations Bathing;Bend;Squat;Stairs;Stand;Transfers   ? Examination-Participation Restrictions Community Activity;Shop;Meal Prep   ? Stability/Clinical Decision Making Evolving/Moderate complexity   ? Rehab Potential Good   ? PT Frequency 2x / week   ? PT Duration 8 weeks   ? PT Treatment/Interventions ADLs/Self Care Home Management;Aquatic Therapy;Canalith Repostioning;Cryotherapy;Electrical Stimulation;Moist Heat;Iontophoresis '4mg'$ /ml Dexamethasone;Traction;Ultrasound;Gait training;Stair training;Functional mobility training;Therapeutic activities;Therapeutic exercise;Balance training;Neuromuscular re-education;Cognitive  remediation;Patient/family education;Manual techniques;Passive range of motion;Dry needling;Vestibular;Spinal Manipulations;Joint Manipulations   ? PT Next Visit Plan Continue with balance and core/LE strengthening   ? PT Home Ex

## 2022-03-28 ENCOUNTER — Ambulatory Visit: Payer: Medicare Other | Attending: Internal Medicine | Admitting: Physical Therapy

## 2022-03-28 DIAGNOSIS — M6281 Muscle weakness (generalized): Secondary | ICD-10-CM | POA: Insufficient documentation

## 2022-03-28 DIAGNOSIS — M5459 Other low back pain: Secondary | ICD-10-CM | POA: Insufficient documentation

## 2022-03-28 DIAGNOSIS — R2689 Other abnormalities of gait and mobility: Secondary | ICD-10-CM

## 2022-03-28 DIAGNOSIS — R262 Difficulty in walking, not elsewhere classified: Secondary | ICD-10-CM

## 2022-03-28 DIAGNOSIS — R2681 Unsteadiness on feet: Secondary | ICD-10-CM | POA: Diagnosis present

## 2022-03-28 NOTE — Therapy (Signed)
[place] [facility] ?[place], [place], [address] ?Phone: [phone]   Fax:  [phone] ? ?Physical Therapy Treatment ? ?Patient Details  ?Name: Kristina Rowe ?MRN: [id number] ?Date of Birth: 1930/04/05 ?Referring Provider (PT): Dr. Tressia Miners ? ? ?Encounter Date: 03/28/2022 ? ? PT End of Session - 03/28/22 1728   ? ? Visit Number 14   ? Number of Visits 17   ? Date for PT Re-Evaluation 03/30/22   ? Authorization Type eval: 02/02/22   ? Progress Note Due on Visit 20   ? PT Start Time 1148   ? PT Stop Time 6301   ? PT Time Calculation (min) 41 min   ? Equipment Utilized During Treatment Gait belt   ? Activity Tolerance Patient tolerated treatment well   ? Behavior During Therapy [facility] for tasks assessed/performed   ? ?  ?  ? ?  ? ? ?Past Medical History:  ?Diagnosis Date  ? Ductal carcinoma in situ (DCIS) of left breast   ? Dysphagia   ? GERD (gastroesophageal reflux disease)   ? Hiatal hernia   ? Hypertension   ? Iron deficiency anemia   ? Osteoporosis   ? Pulmonary embolism ([place])   ? Scoliosis   ? UTI (urinary tract infection)   ? ? ?Past Surgical History:  ?Procedure Laterality Date  ? BREAST LUMPECTOMY Left 2009  ? Ductal Carcinoma insitu   ? CATARACT EXTRACTION, BILATERAL    ? COLONOSCOPY N/A 03/17/2021  ? Procedure: COLONOSCOPY;  Surgeon: Lesly Rubenstein, MD;  [facility]: [facility] ENDOSCOPY;  Service: Endoscopy;  Laterality: N/A;  ? COLOSTOMY REVISION Right 03/18/2021  ? Procedure: COLON RESECTION RIGHT;  Surgeon: Olean Ree, MD;  Location: [facility] ORS;  Service: General;  Laterality: Right;  ? LAPAROSCOPIC PARAESOPHAGEAL HERNIA REPAIR  2009  ? LAPAROTOMY N/A 03/10/2021  ? Procedure: EXPLORATORY LAPAROTOMY;  Surgeon: Olean Ree, MD;  Location: [facility] ORS;  Service: General;  Laterality: N/A;  ? TUBAL LIGATION    ? ? ?There were no vitals filed for this visit. ? ? Subjective Assessment - 03/28/22 1151   ? ? Subjective Pt reports doing well since last visit.  Had some back pain with the pressure changes but doing beter now.   ? Pertinent History Pt underwent "2 major surgies" last April and reports a slow deterioration in her balance and strength since that time. She is fearful of falling and if she stands for an extended period of time her legs feel weak.  Pt goes to the [facility] for exercise twice/week. No falls in the last 6-12 months. She uses a single point cane in her RUE to help with her balance during ambulation. Patient lives in a single story townhome (one step to enter) independently. She has a walk-in shower with a seat and grab bars. She has chronic L low back pain and uses topical patches to manage. She takes Warfarin so she can only use Tylenol to help with her pain. She was recently started on low-dose Norvasc due to hypertension which has been effective at lowering her blood pressure readings.   ? Diagnostic tests None reported   ? Patient Stated Goals Improve balance and decrease risk for falls   ? Currently in Pain? Yes   ? Pain Score 4    ? Pain Location Back   ? Pain Orientation Mid   ? Pain Descriptors / Indicators Aching   ? Pain Onset More than a month ago   ? ?  ?  ? ?  ? ? ? ? ?  Exercise/Activity Sets/ Reps/Time/ Resistance Assistance Charge type Comments Unless otherwise stated, CGA was provided and gait belt donned in order to ensure pt safety ?   ?Ball roll outs 3 way 5 x 5 sec  therex To improve low back mobility and pain prior to other strengthening activities   ?Standing march with hip extension and HS curl combo after each  *10 reps each LE ?3 lb   Therex VC to keep core engaged  and for proper breathing.   ?HS curl standing with 3# AW  *10 ea side  therex Cues for upright posture   ?STS  3*6 CGA Therex UE support on on base of chair but not on chair arms   ?Seated Side step over 1/2 FR  X 10 reps ?2.5 lb  Therex  At Support bar, cues for wide BOS and large steps   ?LAQ - postural focus sit on dyna disc for first set  2 x 10  reps x  3 #   Therex    ?1/2 FR rocks  ?1/2 FR stance  X 30 ?2 x 45 sec  UE ?Intermittent UE, CGA  NMR Improved stability with intermittent UE  ?Cues for proper positioning   ?Airex adducted stance  2 x 30 sec  ? ?2*30 seconds with small horizontal head turns  Intermittent UE, CGA  NMR  Good stability noted, fatigue noted toward last repetition    ?      ?           ?Treatment provided this session ?  ?  ?Pt educated throughout session about proper posture and technique with exercises. Improved exercise technique, movement at target joints, use of target muscles after min to mod verbal, visual, tactile cues. ?  ? ? ? ? ? ? ? ? ? ? ? ? ? ? ? ? ? ? ? ? ? ? ? ? ? ? ? ? PT Education - 03/28/22 1727   ? ? Education Details shoulder anatomy for improved motion with ball roll outs   ? Person(s) Educated Patient   ? Methods Explanation   ? Comprehension Verbalized understanding   ? ?  ?  ? ?  ? ? ? PT Short Term Goals - 03/10/22 1152   ? ?  ? PT SHORT TERM GOAL #1  ? Title Pt will be independent with HEP in order to improve strength and balance in order to decrease fall risk and improve function at home.   ? Baseline Pt is completing HEP independently a tthis time.   ? Time 4   ? Period Weeks   ? Status New   ? Target Date 03/02/22   ?  ? PT SHORT TERM GOAL #2  ? Title Pt will improve BERG by at least 3 points in order to demonstrate clinically significant improvement in balance.   ? Baseline 02/02/22: 37/56 03/10/22: 40/56   ? Time 4   ? Period Weeks   ? Status Achieved   ? Target Date 03/02/22   ? ?  ?  ? ?  ? ? ? ? PT Long Term Goals - 03/10/22 1206   ? ?  ? PT LONG TERM GOAL #1  ? Title Pt will improve BERG to at least 43/56 in order to demonstrate clinically significant improvement in balance and decreased risk for falls   ? Baseline 02/02/22: 37/56 4/13: 40   ? Time 8   ? Period Weeks   ? Status New   ?  Target Date 03/30/22   ?  ? PT LONG TERM GOAL #2  ? Title Pt will increase self-selected 10MWT by at least 0.13 m/s in order to  demonstrate clinically significant improvement in community ambulation.   ? Baseline 02/02/22: self-selected: 14.7s = 0.68 m/s 4/13 . 73 m/s   ? Time 8   ? Period Weeks   ? Status New   ? Target Date 03/30/22   ?  ? PT LONG TERM GOAL #3  ? Title Pt will be able to perform sit to stand x 5 from regular height chair without UE use in order to improvement independence with transfers and decrease her risk for falls   ? Baseline 02/02/22: unable to perform 1 repetition, 4/13: 22.8 with UE on surface of chair   ? Time 8   ? Period Weeks   ? Status On-going   ? Target Date 03/30/22   ?  ? PT LONG TERM GOAL #4  ? Title Pt will decrease TUG to below 12 seconds in order to demonstrate decreased fall risk and improved functional mobility   ? Baseline 02/02/22: 14.8s 4/13: 18.48 sec   ? Time 8   ? Period Weeks   ? Status New   ? Target Date 03/30/22   ? ?  ?  ? ?  ? ? ? ? ? ? ? ? Plan - 03/28/22 1728   ? ? Clinical Impression Statement Patient is pleasant and presents with excellent motivation for completion of physical therapy interventions.  Patient continues to progress with dynamic lower extremity balance and strengthening exercises.  Patient also continues to report progressions with her ability to complete home exercises as well as different activities around her home such as completing sit to stand activities.  Patient also reports lower levels of back pain there are completion of physical therapy activities.  Patient will continue to benefit from skilled physical therapy intervention in order to improve her balance, strength, function, and quality of life.   ? Personal Factors and Comorbidities Age;Comorbidity 3+   ? Comorbidities anemia, HTN, aortic stenosis, chronic back pain   ? Examination-Activity Limitations Bathing;Bend;Squat;Stairs;Stand;Transfers   ? Examination-Participation Restrictions Community Activity;Shop;Meal Prep   ? Stability/Clinical Decision Making Evolving/Moderate complexity   ? Rehab Potential Good   ?  PT Frequency 2x / week   ? PT Duration 8 weeks   ? PT Treatment/Interventions ADLs/Self Care Home Management;Aquatic Therapy;Canalith Repostioning;Cryotherapy;Electrical Stimulation;Moist Heat;Iontoph

## 2022-03-30 ENCOUNTER — Ambulatory Visit: Payer: Medicare Other | Admitting: Physical Therapy

## 2022-03-30 DIAGNOSIS — R262 Difficulty in walking, not elsewhere classified: Secondary | ICD-10-CM

## 2022-03-30 DIAGNOSIS — R2681 Unsteadiness on feet: Secondary | ICD-10-CM

## 2022-03-30 DIAGNOSIS — M6281 Muscle weakness (generalized): Secondary | ICD-10-CM

## 2022-03-30 DIAGNOSIS — R2689 Other abnormalities of gait and mobility: Secondary | ICD-10-CM

## 2022-03-30 NOTE — Therapy (Signed)
[place] [facility] ?[place], [place], [address] ?Phone: [phone]   Fax:  [phone] ? ?Physical Therapy Treatment/ RECERT ? ?Patient Details  ?Name: Kristina Rowe ?MRN: [id number] ?Date of Birth: March 09, 1930 ?Referring Provider (PT): Dr. Tressia Miners ? ? ?Encounter Date: 03/30/2022 ? ? PT End of Session - 03/30/22 1420   ? ? Visit Number 17   ? Number of Visits 25   ? Date for PT Re-Evaluation 05/25/22   ? Progress Note Due on Visit 20   ? PT Start Time 1303   ? PT Stop Time 7078   ? PT Time Calculation (min) 42 min   ? Equipment Utilized During Treatment Gait belt   ? Activity Tolerance Patient tolerated treatment well   ? ?  ?  ? ?  ? ? ?Past Medical History:  ?Diagnosis Date  ? Ductal carcinoma in situ (DCIS) of left breast   ? Dysphagia   ? GERD (gastroesophageal reflux disease)   ? Hiatal hernia   ? Hypertension   ? Iron deficiency anemia   ? Osteoporosis   ? Pulmonary embolism ([place])   ? Scoliosis   ? UTI (urinary tract infection)   ? ? ?Past Surgical History:  ?Procedure Laterality Date  ? BREAST LUMPECTOMY Left 2009  ? Ductal Carcinoma insitu   ? CATARACT EXTRACTION, BILATERAL    ? COLONOSCOPY N/A 03/17/2021  ? Procedure: COLONOSCOPY;  Surgeon: Lesly Rubenstein, MD;  Location: [facility] ENDOSCOPY;  Service: Endoscopy;  Laterality: N/A;  ? COLOSTOMY REVISION Right 03/18/2021  ? Procedure: COLON RESECTION RIGHT;  Surgeon: Olean Ree, MD;  Location: [facility] ORS;  Service: General;  Laterality: Right;  ? LAPAROSCOPIC PARAESOPHAGEAL HERNIA REPAIR  2009  ? LAPAROTOMY N/A 03/10/2021  ? Procedure: EXPLORATORY LAPAROTOMY;  Surgeon: Olean Ree, MD;  Location: [facility] ORS;  Service: General;  Laterality: N/A;  ? TUBAL LIGATION    ? ? ?There were no vitals filed for this visit. ? ? Subjective Assessment - 03/30/22 1304   ? ? Subjective Pt reports doing well since last visit. Had some back apin starting this AM. Pain rating 8/10.  Patient reports her balance and  mobility continues to improve and her failures have noticed her walking is improving as well.   ? Pertinent History Pt underwent "2 major surgies" last April and reports a slow deterioration in her balance and strength since that time. She is fearful of falling and if she stands for an extended period of time her legs feel weak.  Pt goes to the [facility] for exercise twice/week. No falls in the last 6-12 months. She uses a single point cane in her RUE to help with her balance during ambulation. Patient lives in a single story townhome (one step to enter) independently. She has a walk-in shower with a seat and grab bars. She has chronic L low back pain and uses topical patches to manage. She takes Warfarin so she can only use Tylenol to help with her pain. She was recently started on low-dose Norvasc due to hypertension which has been effective at lowering her blood pressure readings.   ? Diagnostic tests None reported   ? Patient Stated Goals Improve balance and decrease risk for falls   ? Pain Score 8    ? Pain Location Back   ? Pain Orientation Mid   ? Pain Descriptors / Indicators Aching   ? Pain Type Chronic pain   ? Pain Onset More than a  month ago   ? ?  ?  ? ?  ? ? ? ? ? [facility] PT Assessment - 03/30/22 0001   ? ?  ? Standardized Balance Assessment  ? Standardized Balance Assessment Berg Balance Test   ?  ? Berg Balance Test  ? Sit to Stand Able to stand without using hands and stabilize independently   ? Standing Unsupported Able to stand safely 2 minutes   ? Sitting with Back Unsupported but Feet Supported on Floor or Stool Able to sit safely and securely 2 minutes   ? Stand to Sit Controls descent by using hands   ? Transfers Able to transfer safely, definite need of hands   ? Standing Unsupported with Eyes Closed Able to stand 10 seconds safely   ? Standing Unsupported with Feet Together Able to place feet together independently and stand 1 minute safely   ? From Standing, Reach Forward with  Outstretched Arm Can reach forward >12 cm safely (5")   ? From Standing Position, Pick up Object from Floor Able to pick up shoe, needs supervision   ? From Standing Position, Turn to Look Behind Over each Shoulder Turn sideways only but maintains balance   ? Turn 360 Degrees Able to turn 360 degrees safely but slowly   ? Standing Unsupported, Alternately Place Feet on Step/Stool Able to stand independently and complete 8 steps >20 seconds   ? Standing Unsupported, One Foot in Front Able to plae foot ahead of the other independently and hold 30 seconds   ? Standing on One Leg Tries to lift leg/unable to hold 3 seconds but remains standing independently   ? Total Score 43   ? ?  ?  ? ?  ? ? ?Physical therapy treatment session today consisted of completing assessment of goals and administration of testing as demonstrated in flow sheet and goals section of this exam . Addition treatments may be found below.  ? ? ? ?  Pleated abducted stance on Airex pad with that are cold and superior inferior head turns in order to improve lower extremity balance and endurance.  Patient had some fatigue toward end of this. ? ? ? ? ? ? ? ? ? ? ? ? ? ? ? ? ? ? ? PT Education - 03/30/22 1420   ? ? Education Details Progress with therapy   ? Person(s) Educated Patient   ? Methods Explanation   ? Comprehension Verbalized understanding   ? ?  ?  ? ?  ? ? ? PT Short Term Goals - 03/10/22 1152   ? ?  ? PT SHORT TERM GOAL #1  ? Title Pt will be independent with HEP in order to improve strength and balance in order to decrease fall risk and improve function at home.   ? Baseline Pt is completing HEP independently a tthis time.   ? Time 4   ? Period Weeks   ? Status New   ? Target Date 03/02/22   ?  ? PT SHORT TERM GOAL #2  ? Title Pt will improve BERG by at least 3 points in order to demonstrate clinically significant improvement in balance.   ? Baseline 02/02/22: 37/56 03/10/22: 40/56   ? Time 4   ? Period Weeks   ? Status Achieved   ? Target  Date 03/02/22   ? ?  ?  ? ?  ? ? ? ? PT Long Term Goals - 03/30/22 1312   ? ?  ?  PT LONG TERM GOAL #1  ? Title Pt will improve BERG to at least 43/56 in order to demonstrate clinically significant improvement in balance and decreased risk for falls   ? Baseline 02/02/22: 37/56 4/13: 43   ? Time 8   ? Period Weeks   ? Status Achieved   ? Target Date 03/30/22   ?  ? PT LONG TERM GOAL #2  ? Title Pt will increase self-selected 10MWT by at least 0.13 m/s in order to demonstrate clinically significant improvement in community ambulation.   ? Baseline 02/02/22: self-selected: 14.7s = 0.68 m/s 4/13 . 73 m/s 5/3: .97 m/s   ? Time 8   ? Period Weeks   ? Status Partially Met   ? Target Date 05/25/22   ?  ? PT LONG TERM GOAL #3  ? Title Pt will be able to perform sit to stand x 5 from regular height chair without UE use in order to improvement independence with transfers and decrease her risk for falls   ? Baseline 02/02/22: unable to perform 1 repetition, 4/13: 22.8 with UE on surface of chair 13.82 sec, 24.44 sec no UE   ? Time 8   ? Period Weeks   ? Status Achieved   ? Target Date 03/30/22   ?  ? PT LONG TERM GOAL #4  ? Title Pt will decrease TUG to below 12 seconds in order to demonstrate decreased fall risk and improved functional mobility   ? Baseline 02/02/22: 14.8s 4/13: 18.48 sec 14.2 sec   ? Time 8   ? Period Weeks   ? Status New   ? Target Date 05/25/22   ? ?  ?  ? ?  ? ? ? ? ? ? ? ? Plan - 03/30/22 1337   ? ? Clinical Impression Statement Patient is pleasant and presents with excellent motivation for completion of therapy activities.  Patient presents for recertification note this date has made significant improvement toward all of her goals.  Patient demonstrates improvement in her Merrilee Jansky balance test indicating reduced risk of falls, patient also has improved timed up and go as well as improved 10 m walk test and 5 times sit to stand test all indicating decreased risk of falls and improved mobility and lower extremity  strength.Pt will decrease frequency to one time per [facility] while continuing to work diligently with her HEP.  Patient is still progressing toward several of her goals and will continue to benefit from skilled physical the

## 2022-04-05 ENCOUNTER — Ambulatory Visit: Payer: Medicare Other

## 2022-04-07 ENCOUNTER — Ambulatory Visit: Payer: Medicare Other | Admitting: Physical Therapy

## 2022-04-07 DIAGNOSIS — R2689 Other abnormalities of gait and mobility: Secondary | ICD-10-CM

## 2022-04-07 DIAGNOSIS — M5459 Other low back pain: Secondary | ICD-10-CM

## 2022-04-07 DIAGNOSIS — R2681 Unsteadiness on feet: Secondary | ICD-10-CM

## 2022-04-07 DIAGNOSIS — R262 Difficulty in walking, not elsewhere classified: Secondary | ICD-10-CM | POA: Diagnosis not present

## 2022-04-07 NOTE — Therapy (Signed)
[place] [facility] ?[place], [place], [address] ?Phone: [phone]   Fax:  [phone] ? ?Physical Therapy Treatment ? ?Patient Details  ?Name: Kristina Rowe ?MRN: [id number] ?Date of Birth: Feb 29, 1930 ?Referring Provider (PT): Dr. Tressia Miners ? ? ?Encounter Date: 04/07/2022 ? ? PT End of Session - 04/07/22 1518   ? ? Visit Number 18   ? Number of Visits 25   ? Date for PT Re-Evaluation 05/25/22   ? Progress Note Due on Visit 20   ? PT Start Time 1510   ? PT Stop Time 6962   ? PT Time Calculation (min) 47 min   ? Equipment Utilized During Treatment Gait belt   ? Activity Tolerance Patient tolerated treatment well   ? ?  ?  ? ?  ? ? ?Past Medical History:  ?Diagnosis Date  ? Ductal carcinoma in situ (DCIS) of left breast   ? Dysphagia   ? GERD (gastroesophageal reflux disease)   ? Hiatal hernia   ? Hypertension   ? Iron deficiency anemia   ? Osteoporosis   ? Pulmonary embolism ([place])   ? Scoliosis   ? UTI (urinary tract infection)   ? ? ?Past Surgical History:  ?Procedure Laterality Date  ? BREAST LUMPECTOMY Left 2009  ? Ductal Carcinoma insitu   ? CATARACT EXTRACTION, BILATERAL    ? COLONOSCOPY N/A 03/17/2021  ? Procedure: COLONOSCOPY;  Surgeon: Lesly Rubenstein, MD;  Location: [facility] ENDOSCOPY;  Service: Endoscopy;  Laterality: N/A;  ? COLOSTOMY REVISION Right 03/18/2021  ? Procedure: COLON RESECTION RIGHT;  Surgeon: Olean Ree, MD;  Location: [facility] ORS;  Service: General;  Laterality: Right;  ? LAPAROSCOPIC PARAESOPHAGEAL HERNIA REPAIR  2009  ? LAPAROTOMY N/A 03/10/2021  ? Procedure: EXPLORATORY LAPAROTOMY;  Surgeon: Olean Ree, MD;  Location: [facility] ORS;  Service: General;  Laterality: N/A;  ? TUBAL LIGATION    ? ? ?There were no vitals filed for this visit. ? ? Subjective Assessment - 04/07/22 1513   ? ? Subjective Pt reports doing well since last visit. Had some back pain this AM.   ? Pertinent History Pt underwent "2 major surgies" last April and  reports a slow deterioration in her balance and strength since that time. She is fearful of falling and if she stands for an extended period of time her legs feel weak.  Pt goes to the [facility] for exercise twice/week. No falls in the last 6-12 months. She uses a single point cane in her RUE to help with her balance during ambulation. Patient lives in a single story townhome (one step to enter) independently. She has a walk-in shower with a seat and grab bars. She has chronic L low back pain and uses topical patches to manage. She takes Warfarin so she can only use Tylenol to help with her pain. She was recently started on low-dose Norvasc due to hypertension which has been effective at lowering her blood pressure readings.   ? Diagnostic tests None reported   ? Patient Stated Goals Improve balance and decrease risk for falls   ? Pain Onset More than a month ago   ? ?  ?  ? ?  ? ? ? ? ?Exercise/Activity Sets/ Reps/Time/ Resistance Assistance Charge type Comments Unless otherwise stated, CGA was provided and gait belt donned in order to ensure pt safety ?   ?Ball roll outs 3 way 5 x 5 sec  therex To improve low back mobility and  pain prior to other strengthening activities   ?Standing march with hip extension and HS curl combo after each  *10 reps each LE ?3 lb   Therex VC to keep core engaged  and for proper breathing.   ?HS curl standing with 3# AW  *10 ea side  therex Cues for upright posture   ?STS  3*6 CGA Therex UE support on on base of chair but not on chair arms   ?Seated Side step over 1/2 FR  X 10 reps ?2.5 lb  Therex  At Support bar, cues for wide BOS and large steps   ?LAQ - postural focus sit on dyna disc for first set  2 x 10  reps x 3 #   Therex    ?Rocker board rocks  ?Rocker Board  X 30 ?2 x 45 sec  UE ?Intermittent UE, CGA  NMR Improved stability with intermittent UE  ?Cues for proper positioning   ?Airex adducted stance  2 x 30 sec  ? ?2*30 seconds with small horizontal head turns   Intermittent UE, CGA  NMR  Good stability noted, fatigue noted toward last repetition    ?      ?           ?Treatment provided this session ?  ? Instructed in LTR with limited motion for improvement in LBP and mobility  ?Pt educated throughout session about proper posture and technique with exercises. Improved exercise technique, movement at target joints, use of target muscles after min to mod verbal, visual, tactile cues. ?  ? ? ? ? ? ? ? ? ? ? ? ? ? ? ? ? ? ? ? ? ? ? ? ? ? ? ? ? ? ? ? ? ? PT Education - 04/07/22 1516   ? ? Education Details Exercise form and technique   ? Person(s) Educated Patient   ? Methods Explanation   ? Comprehension Verbalized understanding   ? ?  ?  ? ?  ? ? ? PT Short Term Goals - 03/10/22 1152   ? ?  ? PT SHORT TERM GOAL #1  ? Title Pt will be independent with HEP in order to improve strength and balance in order to decrease fall risk and improve function at home.   ? Baseline Pt is completing HEP independently a tthis time.   ? Time 4   ? Period Weeks   ? Status New   ? Target Date 03/02/22   ?  ? PT SHORT TERM GOAL #2  ? Title Pt will improve BERG by at least 3 points in order to demonstrate clinically significant improvement in balance.   ? Baseline 02/02/22: 37/56 03/10/22: 40/56   ? Time 4   ? Period Weeks   ? Status Achieved   ? Target Date 03/02/22   ? ?  ?  ? ?  ? ? ? ? PT Long Term Goals - 03/30/22 1312   ? ?  ? PT LONG TERM GOAL #1  ? Title Pt will improve BERG to at least 43/56 in order to demonstrate clinically significant improvement in balance and decreased risk for falls   ? Baseline 02/02/22: 37/56 4/13: 43   ? Time 8   ? Period Weeks   ? Status Achieved   ? Target Date 03/30/22   ?  ? PT LONG TERM GOAL #2  ? Title Pt will increase self-selected 10MWT by at least 0.13 m/s in order to demonstrate clinically significant improvement  in community ambulation.   ? Baseline 02/02/22: self-selected: 14.7s = 0.68 m/s 4/13 . 73 m/s 5/3: .97 m/s   ? Time 8   ? Period Weeks   ? Status  Partially Met   ? Target Date 05/25/22   ?  ? PT LONG TERM GOAL #3  ? Title Pt will be able to perform sit to stand x 5 from regular height chair without UE use in order to improvement independence with transfers and decrease her risk for falls   ? Baseline 02/02/22: unable to perform 1 repetition, 4/13: 22.8 with UE on surface of chair 13.82 sec, 24.44 sec no UE   ? Time 8   ? Period Weeks   ? Status Achieved   ? Target Date 03/30/22   ?  ? PT LONG TERM GOAL #4  ? Title Pt will decrease TUG to below 12 seconds in order to demonstrate decreased fall risk and improved functional mobility   ? Baseline 02/02/22: 14.8s 4/13: 18.48 sec 14.2 sec   ? Time 8   ? Period Weeks   ? Status New   ? Target Date 05/25/22   ? ?  ?  ? ?  ? ? ? ? ? ? ? ? Plan - 04/07/22 1531   ? ? Clinical Impression Statement Pt is pleasant and presents with excellent motivation for copmletion of PT activities. Pt had some trouble completing her HEP due to her busy schedule but she will be better in the next weeks. Pt introduced to more dynamic balance activities which she shows good progress with following practice and repetitions. Pt still very hesitant to complete activities without UE assist dud to FOF. Pt also introducted to LTR for forther improvement in low back mobility and pain. Pt will continue to benefit from skilled PT in order to improve her balance, strength, and QOL.   ? Personal Factors and Comorbidities Age;Comorbidity 3+   ? Comorbidities anemia, HTN, aortic stenosis, chronic back pain   ? Examination-Activity Limitations Bathing;Bend;Squat;Stairs;Stand;Transfers   ? Examination-Participation Restrictions Community Activity;Shop;Meal Prep   ? Stability/Clinical Decision Making Evolving/Moderate complexity   ? Rehab Potential Good   ? PT Frequency 1x / week   ? PT Duration 8 weeks   ? PT Treatment/Interventions ADLs/Self Care Home Management;Aquatic Therapy;Canalith Repostioning;Cryotherapy;Electrical Stimulation;Moist  Heat;Iontophoresis 75m/ml Dexamethasone;Traction;Ultrasound;Gait training;Stair training;Functional mobility training;Therapeutic activities;Therapeutic exercise;Balance training;Neuromuscular re-education;Cognitive remediation;Pa

## 2022-04-12 ENCOUNTER — Ambulatory Visit: Payer: Medicare Other | Admitting: Physical Therapy

## 2022-04-14 ENCOUNTER — Encounter: Payer: Self-pay | Admitting: Physician Assistant

## 2022-04-14 ENCOUNTER — Ambulatory Visit: Payer: Medicare Other | Admitting: Physical Therapy

## 2022-04-14 ENCOUNTER — Ambulatory Visit (INDEPENDENT_AMBULATORY_CARE_PROVIDER_SITE_OTHER): Payer: Medicare Other | Admitting: Physician Assistant

## 2022-04-14 VITALS — BP 159/81 | HR 83 | Ht <= 58 in | Wt 93.0 lb

## 2022-04-14 DIAGNOSIS — R1030 Lower abdominal pain, unspecified: Secondary | ICD-10-CM

## 2022-04-14 DIAGNOSIS — R350 Frequency of micturition: Secondary | ICD-10-CM | POA: Diagnosis not present

## 2022-04-14 DIAGNOSIS — Z87440 Personal history of urinary (tract) infections: Secondary | ICD-10-CM

## 2022-04-14 DIAGNOSIS — N390 Urinary tract infection, site not specified: Secondary | ICD-10-CM

## 2022-04-14 LAB — URINALYSIS, COMPLETE
Bilirubin, UA: NEGATIVE
Glucose, UA: NEGATIVE
Ketones, UA: NEGATIVE
Nitrite, UA: NEGATIVE
Protein,UA: NEGATIVE
Specific Gravity, UA: 1.015 (ref 1.005–1.030)
Urobilinogen, Ur: 0.2 mg/dL (ref 0.2–1.0)
pH, UA: 7 (ref 5.0–7.5)

## 2022-04-14 LAB — MICROSCOPIC EXAMINATION: WBC, UA: >30 /hpf — AB (ref 0–5)

## 2022-04-14 MED ORDER — NITROFURANTOIN MONOHYD MACRO 100 MG PO CAPS: 100.0000 mg | ORAL_CAPSULE | Freq: Two times a day (BID) | ORAL | 0 refills | Status: DC

## 2022-04-14 NOTE — Progress Notes (Signed)
04/14/2022 5:01 PM   Kristina Rowe [birth year] [id number]  CC: Chief Complaint  Patient presents with   Urinary Frequency    HPI: Kristina Rowe is a 88 y.o. female with PMH OAB wet previously on tolterodine who discontinued Myrbetriq due to cost and recurrent UTI on topical vaginal estrogen cream who presents today for evaluation of possible UTI.   Today she reports increased urinary frequency this week associated with lower abdominal pain.  She denies fever, chills, nausea, vomiting, flank pain, and gross hematuria.  In-office catheterized UA today positive for trace intact blood and 1+ leukocyte esterase; urine microscopy with >30 WBCs/HPF and many bacteria.  PMH: Past Medical History:  Diagnosis Date   Ductal carcinoma in situ (DCIS) of left breast    Dysphagia    GERD (gastroesophageal reflux disease)    Hiatal hernia    Hypertension    Iron deficiency anemia    Osteoporosis    Pulmonary embolism ([facility])    Scoliosis    UTI (urinary tract infection)     Surgical History: Past Surgical History:  Procedure Laterality Date   BREAST LUMPECTOMY Left 2009   Ductal Carcinoma insitu    CATARACT EXTRACTION, BILATERAL     COLONOSCOPY N/A 03/17/2021   Procedure: COLONOSCOPY;  Surgeon: Lesly Rubenstein, MD;  Location: [facility] ENDOSCOPY;  Service: Endoscopy;  Laterality: N/A;   COLOSTOMY REVISION Right 03/18/2021   Procedure: COLON RESECTION RIGHT;  Surgeon: Olean Ree, MD;  Location: [facility] ORS;  Service: General;  Laterality: Right;   LAPAROSCOPIC PARAESOPHAGEAL HERNIA REPAIR  2009   LAPAROTOMY N/A 03/10/2021   Procedure: EXPLORATORY LAPAROTOMY;  Surgeon: Olean Ree, MD;  Location: [facility] ORS;  Service: General;  Laterality: N/A;   TUBAL LIGATION      Home Medications:  Allergies as of 04/14/2022       Reactions   Metronidazole Other (See Comments)   Other reaction(s): Other Mouth sores Mouth sores Mouth sores   Omeprazole Other (See Comments)   Other  reaction(s): Arthralgias (intolerance) Other reaction(s): Other   Bactrim [sulfamethoxazole-trimethoprim] Nausea Only   Codeine Nausea And Vomiting, Nausea Only   Other reaction(s): Nausea Only But tolerates tramadol        Medication List        Accurate as of Apr 14, 2022  5:01 PM. If you have any questions, ask your nurse or doctor.          acetaminophen 500 MG tablet Commonly known as: TYLENOL Take 500 mg by mouth at bedtime as needed for mild pain, fever or headache.   amLODipine 2.5 MG tablet Commonly known as: NORVASC Take 2.5 mg by mouth daily.   ascorbic acid 250 MG tablet Commonly known as: VITAMIN C Take by mouth.   calcium carbonate 1250 (500 Ca) MG chewable tablet Commonly known as: OS-CAL Chew 1 tablet by mouth daily.   Cholecalciferol 50 MCG (2000 UT) Caps Take by mouth.   cyanocobalamin 1000 MCG tablet Take 1,000 mcg by mouth daily.   diclofenac Sodium 1 % Gel Commonly known as: VOLTAREN Apply topically 4 (four) times daily.   econazole nitrate 1 % cream Apply 1 application topically daily.   estradiol 0.1 MG/GM vaginal cream Commonly known as: ESTRACE Estrogen Cream Instruction Discard applicator Apply pea sized amount to tip of finger to urethra before bed. Wash hands well after application. Use Monday, Wednesday and Friday   famotidine 40 MG tablet Commonly known as: PEPCID Take 40 mg by mouth 2 (two) times  daily.   folic acid 1 MG tablet Commonly known as: FOLVITE Take 1 mg by mouth daily.   loperamide 2 MG capsule Commonly known as: IMODIUM Take 2 mg by mouth as needed for diarrhea or loose stools.   mirtazapine 30 MG tablet Commonly known as: REMERON Take 15 mg by mouth at bedtime.   mirtazapine 15 MG tablet Commonly known as: REMERON Take 15 mg by mouth at bedtime.   nitrofurantoin (macrocrystal-monohydrate) 100 MG capsule Commonly known as: MACROBID Take 1 capsule (100 mg total) by mouth 2 (two) times daily for 5  days. Started by: Debroah Loop, PA-C   ondansetron 4 MG disintegrating tablet Commonly known as: Zofran ODT Take 1 tablet (4 mg total) by mouth every 8 (eight) hours as needed for nausea or vomiting.   pantoprazole 40 MG tablet Commonly known as: PROTONIX Take 40 mg by mouth daily.   promethazine 25 MG tablet Commonly known as: PHENERGAN Take 25 mg by mouth every 6 (six) hours as needed for nausea or vomiting.   warfarin 2.5 MG tablet Commonly known as: Coumadin On Sundays and Thursdays resume the 2.5 mg dose.  All other days take 5 mg. What changed:  how much to take how to take this when to take this additional instructions   warfarin 5 MG tablet Commonly known as: COUMADIN Take by mouth. What changed: Another medication with the same name was changed. Make sure you understand how and when to take each.        Allergies:  Allergies  Allergen Reactions   Metronidazole Other (See Comments)    Other reaction(s): Other Mouth sores Mouth sores Mouth sores    Omeprazole Other (See Comments)    Other reaction(s): Arthralgias (intolerance) Other reaction(s): Other    Bactrim [Sulfamethoxazole-Trimethoprim] Nausea Only   Codeine Nausea And Vomiting and Nausea Only    Other reaction(s): Nausea Only But tolerates tramadol     Family History: Family History  Problem Relation Age of Onset   Breast cancer Neg Hx     Social History:   reports that she has never smoked. She has never used smokeless tobacco. She reports that she does not drink alcohol and does not use drugs.  Physical Exam: BP (!) 159/81   Pulse 83   Ht '4\' 10"'$  (1.473 m)   Wt 93 lb (42.2 kg)   BMI 19.44 kg/m   Constitutional:  Alert and oriented, no acute distress, nontoxic appearing HEENT: [place], AT Cardiovascular: No clubbing, cyanosis, or edema Respiratory: Normal respiratory effort, no increased work of breathing Skin: No rashes, bruises or suspicious lesions Neurologic: Grossly  intact, no focal deficits, moving all 4 extremities Psychiatric: Normal mood and affect  Laboratory Data: Results for orders placed or performed in visit on 04/14/22  Microscopic Examination   Urine  Result Value Ref Range   WBC, UA >30 (A) 0 - 5 /hpf   RBC 0-2 0 - 2 /hpf   Epithelial Cells (non renal) 0-10 0 - 10 /hpf   Bacteria, UA Many (A) None seen/Few  Urinalysis, Complete  Result Value Ref Range   Specific Gravity, UA 1.015 1.005 - 1.030   pH, UA 7.0 5.0 - 7.5   Color, UA Yellow Yellow   Appearance Ur Cloudy (A) Clear   Leukocytes,UA 1+ (A) Negative   Protein,UA Negative Negative/Trace   Glucose, UA Negative Negative   Ketones, UA Negative Negative   RBC, UA Trace (A) Negative   Bilirubin, UA Negative Negative   Urobilinogen,  Ur 0.2 0.2 - 1.0 mg/dL   Nitrite, UA Negative Negative   Microscopic Examination See below:    Assessment & Plan:   1. Recurrent UTI UA grossly infected today.  Will start empiric Macrobid and send for culture for further evaluation. - Urinalysis, Complete - CULTURE, URINE COMPREHENSIVE - nitrofurantoin, macrocrystal-monohydrate, (MACROBID) 100 MG capsule; Take 1 capsule (100 mg total) by mouth 2 (two) times daily for 5 days.  Dispense: 10 capsule; Refill: 0  Return if symptoms worsen or fail to improve.  Debroah Loop, PA-C  [facility] [address] [place], [place] [address] [phone]

## 2022-04-14 NOTE — Progress Notes (Signed)
In and Out Catheterization  Patient is present today for a I & O catheterization due to OAB. Patient was cleaned and prepped in a sterile fashion with betadine . A 14FR cath was inserted, she had a bladder spasm during the catheter insertion 31m of urine return was noted, urine was yellow and cloudy in color. A clean urine sample was collected for UA. Bladder was drained  And catheter was removed with out difficulty.    Performed by: RVerlene Mayer [place]

## 2022-04-18 LAB — CULTURE, URINE COMPREHENSIVE

## 2022-04-19 ENCOUNTER — Other Ambulatory Visit: Payer: Self-pay | Admitting: *Deleted

## 2022-04-19 ENCOUNTER — Ambulatory Visit: Payer: Medicare Other | Admitting: Physical Therapy

## 2022-04-19 DIAGNOSIS — R262 Difficulty in walking, not elsewhere classified: Secondary | ICD-10-CM

## 2022-04-19 DIAGNOSIS — M5459 Other low back pain: Secondary | ICD-10-CM

## 2022-04-19 DIAGNOSIS — R2689 Other abnormalities of gait and mobility: Secondary | ICD-10-CM

## 2022-04-19 DIAGNOSIS — R2681 Unsteadiness on feet: Secondary | ICD-10-CM

## 2022-04-19 MED ORDER — CEFUROXIME AXETIL 250 MG PO TABS: 250.0000 mg | ORAL_TABLET | Freq: Two times a day (BID) | ORAL | 0 refills | Status: AC

## 2022-04-19 NOTE — Therapy (Signed)
[place] MAIN [facility] SERVICES [address] [place], [place], [address] Phone: [phone]   Fax:  [phone]  Physical Therapy Treatment  Patient Details  Name: Kristina Rowe MRN: [id number] Date of Birth: 02/10/1930 Referring Provider (PT): Dr. Tressia Miners   Encounter Date: 04/19/2022   PT End of Session - 04/19/22 1107     Visit Number 19    Number of Visits 25    Date for PT Re-Evaluation 05/25/22    Progress Note Due on Visit 20    Equipment Utilized During Treatment Gait belt    Activity Tolerance Patient tolerated treatment well             Past Medical History:  Diagnosis Date   Ductal carcinoma in situ (DCIS) of left breast    Dysphagia    GERD (gastroesophageal reflux disease)    Hiatal hernia    Hypertension    Iron deficiency anemia    Osteoporosis    Pulmonary embolism ([place])    Scoliosis    UTI (urinary tract infection)     Past Surgical History:  Procedure Laterality Date   BREAST LUMPECTOMY Left 2009   Ductal Carcinoma insitu    CATARACT EXTRACTION, BILATERAL     COLONOSCOPY N/A 03/17/2021   Procedure: COLONOSCOPY;  Surgeon: Lesly Rubenstein, MD;  Location: [facility] ENDOSCOPY;  Service: Endoscopy;  Laterality: N/A;   COLOSTOMY REVISION Right 03/18/2021   Procedure: COLON RESECTION RIGHT;  Surgeon: Olean Ree, MD;  Location: [facility] ORS;  Service: General;  Laterality: Right;   LAPAROSCOPIC PARAESOPHAGEAL HERNIA REPAIR  2009   LAPAROTOMY N/A 03/10/2021   Procedure: EXPLORATORY LAPAROTOMY;  Surgeon: Olean Ree, MD;  Location: [facility] ORS;  Service: General;  Laterality: N/A;   TUBAL LIGATION      There were no vitals filed for this visit.   Subjective Assessment - 04/19/22 1105     Subjective Pt reports having a UTI but it has since resolved and she finished the medication yesterday. Pt reports continued pain in her back that is "about the same" but better compared to when she first started coming.     Pertinent History Pt underwent "2 major surgies" last April and reports a slow deterioration in her balance and strength since that time. She is fearful of falling and if she stands for an extended period of time her legs feel weak.  Pt goes to the [facility] for exercise twice/week. No falls in the last 6-12 months. She uses a single point cane in her RUE to help with her balance during ambulation. Patient lives in a single story townhome (one step to enter) independently. She has a walk-in shower with a seat and grab bars. She has chronic L low back pain and uses topical patches to manage. She takes Warfarin so she can only use Tylenol to help with her pain. She was recently started on low-dose Norvasc due to hypertension which has been effective at lowering her blood pressure readings.    Diagnostic tests None reported    Patient Stated Goals Improve balance and decrease risk for falls    Pain Score 7     Pain Location Back    Pain Orientation Mid    Pain Descriptors / Indicators Aching    Pain Type Chronic pain    Pain Onset More than a month ago    Pain Frequency Constant             Exercise/Activity Sets/ Reps/Time/ Resistance Assistance  Charge type Comments Unless otherwise stated, CGA was provided and gait belt donned in order to ensure pt safety    Ball roll outs 3 way 5 x 5 sec  therex To improve low back mobility and pain prior to other strengthening activities   Standing march   2*10 reps each LE 3 lb   Therex Visual cues for proper perfomrance   HS curl standing with 3# AW  2*10 ea side  therex Cues for upright posture   STS seated on airex pad  2*6 CGA Therex UE support on on base of chair but not on chair arms   Seated Side step over laid down hurdle   X 10 reps 2.5 lb  Therex  At Support bar, cues for wide BOS and large steps   LAQ  2 x 10  reps x 3 #   Therex    Rocker board rocks  Rocker Board  X 30 3 x 45 sec  UE Intermittent UE, CGA  NMR Improved stability with  intermittent UE  Cues for proper positioning   Airex adducted stance  2 x 30 sec   2*30 seconds with small horizontal head turns  Intermittent UE, CGA  NMR  Good stability noted, fatigue noted toward last repetition    LAQ  3 x 10 x 3 #   therex Cues for reps    standing EC  X 45 sec    NMR Significant sway but no LOB   Treatment provided this session    Instructed in LTR with limited motion for improvement in LBP and mobility  Pt educated throughout session about proper posture and technique with exercises. Improved exercise technique, movement at target joints, use of target muscles after min to mod verbal, visual, tactile cues.                              PT Education - 04/19/22 1107     Education Details exercise form and technique    Person(s) Educated Patient    Methods Explanation    Comprehension Verbalized understanding              PT Short Term Goals - 03/10/22 1152       PT SHORT TERM GOAL #1   Title Pt will be independent with HEP in order to improve strength and balance in order to decrease fall risk and improve function at home.    Baseline Pt is completing HEP independently a tthis time.    Time 4    Period Weeks    Status New    Target Date 03/02/22      PT SHORT TERM GOAL #2   Title Pt will improve BERG by at least 3 points in order to demonstrate clinically significant improvement in balance.    Baseline 02/02/22: 37/56 03/10/22: 40/56    Time 4    Period Weeks    Status Achieved    Target Date 03/02/22               PT Long Term Goals - 03/30/22 1312       PT LONG TERM GOAL #1   Title Pt will improve BERG to at least 43/56 in order to demonstrate clinically significant improvement in balance and decreased risk for falls    Baseline 02/02/22: 37/56 4/13: 43    Time 8    Period Weeks    Status Achieved      Target Date 03/30/22      PT LONG TERM GOAL #2   Title Pt will increase self-selected 10MWT by at least 0.13  m/s in order to demonstrate clinically significant improvement in community ambulation.    Baseline 02/02/22: self-selected: 14.7s = 0.68 m/s 4/13 . 73 m/s 5/3: .97 m/s    Time 8    Period Weeks    Status Partially Met    Target Date 05/25/22      PT LONG TERM GOAL #3   Title Pt will be able to perform sit to stand x 5 from regular height chair without UE use in order to improvement independence with transfers and decrease her risk for falls    Baseline 02/02/22: unable to perform 1 repetition, 4/13: 22.8 with UE on surface of chair 13.82 sec, 24.44 sec no UE    Time 8    Period Weeks    Status Achieved    Target Date 03/30/22      PT LONG TERM GOAL #4   Title Pt will decrease TUG to below 12 seconds in order to demonstrate decreased fall risk and improved functional mobility    Baseline 02/02/22: 14.8s 4/13: 18.48 sec 14.2 sec    Time 8    Period Weeks    Status New    Target Date 05/25/22                   Plan - 04/19/22 1108     Clinical Impression Statement Pt is pleasant and presents with excellent motivation for copmletion of PT activities. Pt had some trouble completing her HEP due to her busy schedule but she will be better in the next weeks. Pt introduced to more dynamic balance activities which she shows good progress with following practice and repetitions. Pt still very hesitant to complete activities without UE assist dud to FOF. Pt also introducted to LTR for forther improvement in low back mobility and pain. Pt will continue to benefit from skilled PT in order to improve her balance, strength, and QOL.    Personal Factors and Comorbidities Age;Comorbidity 3+    Comorbidities anemia, HTN, aortic stenosis, chronic back pain    Examination-Activity Limitations Bathing;Bend;Squat;Stairs;Stand;Transfers    Examination-Participation Restrictions Community Activity;Shop;Meal Prep    Stability/Clinical Decision Making Evolving/Moderate complexity    Rehab Potential Good     PT Frequency 1x / week    PT Duration 8 weeks    PT Treatment/Interventions ADLs/Self Care Home Management;Aquatic Therapy;Canalith Repostioning;Cryotherapy;Electrical Stimulation;Moist Heat;Iontophoresis 4mg/ml Dexamethasone;Traction;Ultrasound;Gait training;Stair training;Functional mobility training;Therapeutic activities;Therapeutic exercise;Balance training;Neuromuscular re-education;Cognitive remediation;Patient/family education;Manual techniques;Passive range of motion;Dry needling;Vestibular;Spinal Manipulations;Joint Manipulations    PT Next Visit Plan Continue with balance and core/LE strengthening, evaluate activities for safety with [employer] exercise class per pt report.    PT Home Exercise Plan Completing on days not in therapy    Consulted and Agree with Plan of Care Patient             Patient will benefit from skilled therapeutic intervention in order to improve the following deficits and impairments:  Abnormal gait, Decreased balance, Decreased mobility, Pain  Visit Diagnosis: No diagnosis found.     Problem List Patient Active Problem List   Diagnosis Date Noted   Basal cell carcinoma of leg 04/01/2021   Coronary atherosclerosis 04/01/2021   History of pulmonary embolism 04/01/2021   Pulmonary nodule 04/01/2021   Malignant neoplasm of colon (HCC) 03/29/2021   Rectal bleeding 03/15/2021   Hypertension    GERD (gastroesophageal   reflux disease)    Colonic mass    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HCC) 03/10/2021   Anemia, unspecified 03/06/2021   Bronchiectasis without complication (HCC) 03/27/2020   Iron deficiency anemia 03/27/2020   Moderate aortic stenosis 03/27/2020   Stage 3a chronic kidney disease (HCC) 07/04/2019   Hypnic headache 05/28/2019   Telogen effluvium 04/17/2019   Xerosis cutis 04/17/2019   Essential hypertension 01/14/2019   Cervical radiculopathy 05/04/2018   Osteoarthritis of left glenohumeral joint 05/04/2018   Vitamin B12 deficiency 01/01/2018    Vascular abnormality 09/11/2017   Dyspepsia 03/28/2017   Pulmonary embolism (HCC) 08/16/2016   Dilated pore of [place] 03/23/2015   Retinal drusen of both eyes 08/28/2014   Status post right cataract extraction 07/30/2014   Verruca 03/19/2014   Chronic back pain 11/06/2013   Postmenopausal osteoporosis 06/11/2013   Anticoagulated on Coumadin 04/06/2013   Long term (current) use of anticoagulants 10/18/2012   Preglaucoma 05/10/2012   Presbyopia 05/10/2012   Senile nuclear sclerosis 05/10/2012   Lobular carcinoma in situ of breast 04/13/2012   Closed fracture of ankle 02/27/2012   History of nonmelanoma skin cancer 09/05/2011   Other benign neoplasm of skin of trunk 09/05/2011   Osteoporosis 08/25/2011   Transient alteration of awareness 09/03/2009   Colon adenoma 07/30/2002    Christopher B Byrd, PT 04/19/2022, 11:08 AM  [place] [facility] MA[facility] REHAB SERVICES [address] [place], [place], [address] Phone: [phone]   Fax:  [phone]  Name: Kristina Rowe MRN: [id number] Date of Birth: 02/16/1930    

## 2022-04-21 ENCOUNTER — Ambulatory Visit: Payer: Medicare Other | Admitting: Physical Therapy

## 2022-04-27 ENCOUNTER — Encounter: Payer: Self-pay | Admitting: Physical Therapy

## 2022-04-27 ENCOUNTER — Ambulatory Visit: Payer: Medicare Other | Admitting: Physical Therapy

## 2022-04-27 DIAGNOSIS — R262 Difficulty in walking, not elsewhere classified: Secondary | ICD-10-CM

## 2022-04-27 DIAGNOSIS — R2681 Unsteadiness on feet: Secondary | ICD-10-CM

## 2022-04-27 DIAGNOSIS — M6281 Muscle weakness (generalized): Secondary | ICD-10-CM

## 2022-04-27 DIAGNOSIS — R2689 Other abnormalities of gait and mobility: Secondary | ICD-10-CM

## 2022-04-27 NOTE — Therapy (Signed)
[place] MAIN [facility] SERVICES [address] [place], [place], [address] Phone: [phone]   Fax:  [phone]  Physical Therapy Treatment/ Physical Therapy Progress Note   Dates of reporting period  03/10/22   to   04/27/22   Patient Details  Name: Kristina Rowe MRN: [id number] Date of Birth: [birth year] Referring Provider (PT): Dr. Tressia Miners   Encounter Date: 04/27/2022   PT End of Session - 04/27/22 1344     Visit Number 20    Number of Visits 25    Date for PT Re-Evaluation 05/25/22    Authorization Type eval: 02/02/22    Progress Note Due on Visit 20    PT Start Time 1303    PT Stop Time 1345    PT Time Calculation (min) 42 min    Equipment Utilized During Treatment Gait belt    Activity Tolerance Patient tolerated treatment well    Behavior During Therapy WFL for tasks assessed/performed             Past Medical History:  Diagnosis Date   Ductal carcinoma in situ (DCIS) of left breast    Dysphagia    GERD (gastroesophageal reflux disease)    Hiatal hernia    Hypertension    Iron deficiency anemia    Osteoporosis    Pulmonary embolism ([place])    Scoliosis    UTI (urinary tract infection)     Past Surgical History:  Procedure Laterality Date   BREAST LUMPECTOMY Left 2009   Ductal Carcinoma insitu    CATARACT EXTRACTION, BILATERAL     COLONOSCOPY N/A 03/17/2021   Procedure: COLONOSCOPY;  Surgeon: Lesly Rubenstein, MD;  Location: [facility] ENDOSCOPY;  Service: Endoscopy;  Laterality: N/A;   COLOSTOMY REVISION Right 03/18/2021   Procedure: COLON RESECTION RIGHT;  Surgeon: Olean Ree, MD;  Location: [facility] ORS;  Service: General;  Laterality: Right;   LAPAROSCOPIC PARAESOPHAGEAL HERNIA REPAIR  2009   LAPAROTOMY N/A 03/10/2021   Procedure: EXPLORATORY LAPAROTOMY;  Surgeon: Olean Ree, MD;  Location: [facility] ORS;  Service: General;  Laterality: N/A;   TUBAL LIGATION      There were no vitals filed for this visit.    Subjective Assessment - 04/27/22 1305     Subjective Pt reports she has been feeling better following finishing her medications. Pt reports she heard she should start washing her hands before using the restroom. t repoerts the recent illness as well as her busty schedule has made completing exercises more difficulty and she has not been completing them regularly over past several weeks. Pt is trying to get back into routine of exercises.    Pertinent History Pt underwent "2 major surgies" last April and reports a slow deterioration in her balance and strength since that time. She is fearful of falling and if she stands for an extended period of time her legs feel weak.  Pt goes to the [facility] for exercise twice/week. No falls in the last 6-12 months. She uses a single point cane in her RUE to help with her balance during ambulation. Patient lives in a single story townhome (one step to enter) independently. She has a walk-in shower with a seat and grab bars. She has chronic L low back pain and uses topical patches to manage. She takes Warfarin so she can only use Tylenol to help with her pain. She was recently started on low-dose Norvasc due to hypertension which has been effective at lowering her blood pressure readings.  Diagnostic tests None reported    Patient Stated Goals Improve balance and decrease risk for falls    Pain Onset More than a month ago           Physical therapy treatment session today consisted of completing assessment of goals and administration of testing as demonstrated in flow sheet or goals section of this exam. Addition treatments may be found below.    Exercise/Activity Sets/ Reps/Time/ Resistance Assistance Charge type Comments Unless otherwise stated, CGA was provided and gait belt donned in order to ensure pt safety    TUG: 13.67 with SPC 5X STS: 25.1 ( UE on knees)  10MWT: .95 m/s with SPC       Standing step tap to 8 inch step  2*10 reps each LE 3 lb    Therex VC to keep core engaged  and for proper breathing.   HS curl standing with 3# AW  *10 ea side  therex Cues for upright posture               LAQ   2 x 10  reps x 3#   Therex  Increased difficulty on the R LE   Rocker board rocks  Rocker Board  2 x 45 sec  UE Intermittent UE, CGA  NMR Improved stability with intermittent UE  Cues for proper positioning   Airex adducted stance   2*30 seconds with small horizontal head turns  Intermittent UE, CGA  NMR  Good stability noted, fatigue noted toward last repetition                     Treatment provided this session Pt educated throughout session about proper posture and technique with exercises. Improved exercise technique, movement at target joints, use of target muscles after min to mod verbal, visual, tactile cues. Rationale for Evaluation and Treatment Rehabilitation                                 PT Education - 04/27/22 1306     Education Details exercise form and technique    Person(s) Educated Patient    Methods Explanation    Comprehension Verbalized understanding              PT Short Term Goals - 03/10/22 1152       PT SHORT TERM GOAL #1   Title Pt will be independent with HEP in order to improve strength and balance in order to decrease fall risk and improve function at home.    Baseline Pt is completing HEP independently a tthis time.    Time 4    Period Weeks    Status New    Target Date 03/02/22      PT SHORT TERM GOAL #2   Title Pt will improve BERG by at least 3 points in order to demonstrate clinically significant improvement in balance.    Baseline 02/02/22: 37/56 03/10/22: 40/56    Time 4    Period Weeks    Status Achieved    Target Date 03/02/22               PT Long Term Goals - 04/27/22 1312       PT LONG TERM GOAL #1   Title Pt will improve BERG to at least 43/56 in order to demonstrate clinically significant improvement in balance and decreased risk for  falls    Baseline 02/02/22: 37/56  4/13: 43    Time 8    Period Weeks    Status Achieved    Target Date 03/30/22      PT LONG TERM GOAL #2   Title Pt will increase self-selected 10MWT by at least 0.13 m/s in order to demonstrate clinically significant improvement in community ambulation.    Baseline 02/02/22: self-selected: 14.7s = 0.68 m/s 4/13 . 73 m/s 5/3: .97 m/s 5/31:.95 m/s    Time 8    Period Weeks    Status Achieved    Target Date 05/25/22      PT LONG TERM GOAL #3   Title Pt will be able to perform sit to stand x 5 from regular height chair without UE use in order to improvement independence with transfers and decrease her risk for falls    Baseline 02/02/22: unable to perform 1 repetition, 4/13: 22.8 with UE on surface of chair 13.82 sec, 24.44 sec no UE 5/31: 25.1 s UE on knees    Time 8    Period Weeks    Status Achieved    Target Date 03/30/22      PT LONG TERM GOAL #4   Title Pt will decrease TUG to below 12 seconds in order to demonstrate decreased fall risk and improved functional mobility    Baseline 02/02/22: 14.8s 4/13: 18.48 sec 14.2 sec 5/31:13.67 sec    Time 8    Period Weeks    Status New    Target Date 05/25/22                   Plan - 04/27/22 1344     Clinical Impression Statement Pt presents to PT for progress note this date.  Patient has made slight progress toward her goals was not made as significant as progress was earlier in treatment.  This is likely due to patient's recent illness as well as business with her schedule where she has been unable to complete her exercises as prescribed at home.  Patient did improve with her timed up and go test indicating slight decreased risk of falls patient also had some regression in her 5 times sit to stand test.  Patient still has made significant progress since her initial evaluation and is also ambulating at increased speed close to the community ambulator indicating improved function.  Patient will continue  to benefit from skilled physical therapy in order to improve her lower extremity strength, balance, reduce her risk of falls and improve her quality of life. Patient's condition has the potential to improve in response to therapy. Maximum improvement is yet to be obtained. The anticipated improvement is attainable and reasonable in a generally predictable time.      Personal Factors and Comorbidities Age;Comorbidity 3+    Comorbidities anemia, HTN, aortic stenosis, chronic back pain    Examination-Activity Limitations Bathing;Bend;Squat;Stairs;Stand;Transfers    Examination-Participation Restrictions Community Activity;Shop;Meal Prep    Stability/Clinical Decision Making Evolving/Moderate complexity    Rehab Potential Good    PT Frequency 1x / week    PT Duration 8 weeks    PT Treatment/Interventions ADLs/Self Care Home Management;Aquatic Therapy;Canalith Repostioning;Cryotherapy;Electrical Stimulation;Moist Heat;Iontophoresis '4mg'$ /ml Dexamethasone;Traction;Ultrasound;Gait training;Stair training;Functional mobility training;Therapeutic activities;Therapeutic exercise;Balance training;Neuromuscular re-education;Cognitive remediation;Patient/family education;Manual techniques;Passive range of motion;Dry needling;Vestibular;Spinal Manipulations;Joint Manipulations    PT Next Visit Plan Continue with balance and core/LE strengthening, evaluate activities for safety with [employer] exercise class per pt report.    PT Home Exercise Plan Completing on days not in therapy    Consulted and  Agree with Plan of Care Patient             Patient will benefit from skilled therapeutic intervention in order to improve the following deficits and impairments:  Abnormal gait, Decreased balance, Decreased mobility, Pain  Visit Diagnosis: Difficulty in walking, not elsewhere classified  Other abnormalities of gait and mobility  Unsteadiness on feet  Muscle weakness (generalized)     Problem List Patient  Active Problem List   Diagnosis Date Noted   Basal cell carcinoma of leg 04/01/2021   Coronary atherosclerosis 04/01/2021   History of pulmonary embolism 04/01/2021   Pulmonary nodule 04/01/2021   Malignant neoplasm of colon ([place]) 03/29/2021   Rectal bleeding 03/15/2021   Hypertension    GERD (gastroesophageal reflux disease)    Colonic mass    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[place]) 03/10/2021   Anemia, unspecified 03/06/2021   Bronchiectasis without complication ([place]) 33/29/5188   Iron deficiency anemia 03/27/2020   Moderate aortic stenosis 03/27/2020   Stage 3a chronic kidney disease ([place]) 07/04/2019   Hypnic headache 05/28/2019   Telogen effluvium 04/17/2019   Xerosis cutis 04/17/2019   Essential hypertension 01/14/2019   Cervical radiculopathy 05/04/2018   Osteoarthritis of left glenohumeral joint 05/04/2018   Vitamin B12 deficiency 01/01/2018   Vascular abnormality 09/11/2017   Dyspepsia 03/28/2017   Pulmonary embolism ([place]) 08/16/2016   Dilated pore of [place] 03/23/2015   Retinal drusen of both eyes 08/28/2014   Status post right cataract extraction 07/30/2014   Verruca 03/19/2014   Chronic back pain 11/06/2013   Postmenopausal osteoporosis 06/11/2013   Anticoagulated on Coumadin 04/06/2013   Long term (current) use of anticoagulants 10/18/2012   Preglaucoma 05/10/2012   Presbyopia 05/10/2012   Senile nuclear sclerosis 05/10/2012   Lobular carcinoma in situ of breast 04/13/2012   Closed fracture of ankle 02/27/2012   History of nonmelanoma skin cancer 09/05/2011   Other benign neoplasm of skin of trunk 09/05/2011   Osteoporosis 08/25/2011   Transient alteration of awareness 09/03/2009   Colon adenoma 07/30/2002    Particia Lather, PT 04/27/2022, 2:06 PM  [place] [address] [place], [place], [address] Phone: [phone]   Fax:  [phone]  Name: ULAH OLMO MRN: [id number] Date of Birth:  1930/11/12

## 2022-05-03 ENCOUNTER — Other Ambulatory Visit: Payer: Medicare Other

## 2022-05-03 ENCOUNTER — Ambulatory Visit (INDEPENDENT_AMBULATORY_CARE_PROVIDER_SITE_OTHER): Payer: Medicare Other | Admitting: Physician Assistant

## 2022-05-03 ENCOUNTER — Ambulatory Visit: Payer: Medicare Other | Admitting: Physical Therapy

## 2022-05-03 ENCOUNTER — Encounter: Payer: Self-pay | Admitting: Physician Assistant

## 2022-05-03 ENCOUNTER — Ambulatory Visit: Payer: Medicare Other | Admitting: Oncology

## 2022-05-03 VITALS — BP 135/72 | HR 87 | Ht 60.0 in | Wt 90.0 lb

## 2022-05-03 DIAGNOSIS — N390 Urinary tract infection, site not specified: Secondary | ICD-10-CM | POA: Diagnosis not present

## 2022-05-03 LAB — URINALYSIS, COMPLETE
Bilirubin, UA: NEGATIVE
Glucose, UA: NEGATIVE
Ketones, UA: NEGATIVE
Nitrite, UA: NEGATIVE
Specific Gravity, UA: 1.015 (ref 1.005–1.030)
Urobilinogen, Ur: 0.2 mg/dL (ref 0.2–1.0)
pH, UA: 7 (ref 5.0–7.5)

## 2022-05-03 LAB — MICROSCOPIC EXAMINATION: WBC, UA: >30 /hpf — AB (ref 0–5)

## 2022-05-03 MED ORDER — CEFUROXIME AXETIL 250 MG PO TABS: 250.0000 mg | ORAL_TABLET | Freq: Two times a day (BID) | ORAL | 0 refills | Status: AC

## 2022-05-03 NOTE — Progress Notes (Signed)
05/03/2022 5:15 PM   Kristina Rowe 12/05/1929 [id number]  CC: Chief Complaint  Patient presents with   Recurrent UTI   HPI: Kristina Rowe is a 88 y.o. female with PMH OAB wet previously on tolterodine who discontinued Myrbetriq due to cost and recurrent UTI on topical vaginal estrogen cream who presents today for evaluation of possible persistent versus recurrent UTI.  I have seen her twice in the past 2 months for the same.  Most recently on 04/14/2022, her urine culture grew ampicillin resistant Klebsiella pneumoniae and I started her on Macrobid 100 mg twice daily x5 days, but transitioned her to cefuroxime 250 mg twice daily x5 days based on culture results.  Today she reports her symptoms never fully resolved on antibiotics and became particularly noticeable 3 days ago.  She describes dysuria with increased urgency over baseline.  She underwent CT chest abdomen and pelvis without contrast on 11/08/2021 with no urolithiasis.  She underwent cystoscopy in April 2022 with Dr. Diamantina Providence with no significant findings.  In-office UA today positive for 3+ blood, 1+ protein, and 3+ leukocyte esterase; urine microscopy with >30 WBCs/HPF, 3-10 RBCs/HPF, and many bacteria.   PMH: Past Medical History:  Diagnosis Date   Ductal carcinoma in situ (DCIS) of left breast    Dysphagia    GERD (gastroesophageal reflux disease)    Hiatal hernia    Hypertension    Iron deficiency anemia    Osteoporosis    Pulmonary embolism (HCC)    Scoliosis    UTI (urinary tract infection)     Surgical History: Past Surgical History:  Procedure Laterality Date   BREAST LUMPECTOMY Left 2009   Ductal Carcinoma insitu    CATARACT EXTRACTION, BILATERAL     COLONOSCOPY N/A 03/17/2021   Procedure: COLONOSCOPY;  Surgeon: Lesly Rubenstein, MD;  Location: [facility] ENDOSCOPY;  Service: Endoscopy;  Laterality: N/A;   COLOSTOMY REVISION Right 03/18/2021   Procedure: COLON RESECTION RIGHT;  Surgeon: Olean Ree,  MD;  Location: [facility] ORS;  Service: General;  Laterality: Right;   LAPAROSCOPIC PARAESOPHAGEAL HERNIA REPAIR  2009   LAPAROTOMY N/A 03/10/2021   Procedure: EXPLORATORY LAPAROTOMY;  Surgeon: Olean Ree, MD;  Location: [facility] ORS;  Service: General;  Laterality: N/A;   TUBAL LIGATION      Home Medications:  Allergies as of 05/03/2022       Reactions   Metronidazole Other (See Comments)   Other reaction(s): Other Mouth sores Mouth sores Mouth sores   Omeprazole Other (See Comments)   Other reaction(s): Arthralgias (intolerance) Other reaction(s): Other   Bactrim [sulfamethoxazole-trimethoprim] Nausea Only   Codeine Nausea And Vomiting, Nausea Only   Other reaction(s): Nausea Only But tolerates tramadol        Medication List        Accurate as of May 03, 2022  5:15 PM. If you have any questions, ask your nurse or doctor.          acetaminophen 500 MG tablet Commonly known as: TYLENOL Take 500 mg by mouth at bedtime as needed for mild pain, fever or headache.   amLODipine 2.5 MG tablet Commonly known as: NORVASC Take 2.5 mg by mouth daily.   ascorbic acid 250 MG tablet Commonly known as: VITAMIN C Take by mouth.   calcium carbonate 1250 (500 Ca) MG chewable tablet Commonly known as: OS-CAL Chew 1 tablet by mouth daily.   cefUROXime 250 MG tablet Commonly known as: CEFTIN Take 1 tablet (250 mg total) by mouth 2 (two)  times daily with a meal for 7 days. Started by: Debroah Loop, PA-C   Cholecalciferol 50 MCG (2000 UT) Caps Take by mouth.   cyanocobalamin 1000 MCG tablet Take 1,000 mcg by mouth daily.   diclofenac Sodium 1 % Gel Commonly known as: VOLTAREN Apply topically 4 (four) times daily.   econazole nitrate 1 % cream Apply 1 application topically daily.   estradiol 0.1 MG/GM vaginal cream Commonly known as: ESTRACE Estrogen Cream Instruction Discard applicator Apply pea sized amount to tip of finger to urethra before bed. Wash hands well  after application. Use Monday, Wednesday and Friday   famotidine 40 MG tablet Commonly known as: PEPCID Take 40 mg by mouth 2 (two) times daily.   folic acid 1 MG tablet Commonly known as: FOLVITE Take 1 mg by mouth daily.   loperamide 2 MG capsule Commonly known as: IMODIUM Take 2 mg by mouth as needed for diarrhea or loose stools.   mirtazapine 30 MG tablet Commonly known as: REMERON Take 15 mg by mouth at bedtime.   mirtazapine 15 MG tablet Commonly known as: REMERON Take 15 mg by mouth at bedtime.   ondansetron 4 MG disintegrating tablet Commonly known as: Zofran ODT Take 1 tablet (4 mg total) by mouth every 8 (eight) hours as needed for nausea or vomiting.   pantoprazole 40 MG tablet Commonly known as: PROTONIX Take 40 mg by mouth daily.   promethazine 25 MG tablet Commonly known as: PHENERGAN Take 25 mg by mouth every 6 (six) hours as needed for nausea or vomiting.   warfarin 2.5 MG tablet Commonly known as: Coumadin On Sundays and Thursdays resume the 2.5 mg dose.  All other days take 5 mg. What changed:  how much to take how to take this when to take this additional instructions   warfarin 5 MG tablet Commonly known as: COUMADIN Take by mouth. What changed: Another medication with the same name was changed. Make sure you understand how and when to take each.        Allergies:  Allergies  Allergen Reactions   Metronidazole Other (See Comments)    Other reaction(s): Other Mouth sores Mouth sores Mouth sores    Omeprazole Other (See Comments)    Other reaction(s): Arthralgias (intolerance) Other reaction(s): Other    Bactrim [Sulfamethoxazole-Trimethoprim] Nausea Only   Codeine Nausea And Vomiting and Nausea Only    Other reaction(s): Nausea Only But tolerates tramadol     Family History: Family History  Problem Relation Age of Onset   Breast cancer Neg Hx     Social History:   reports that she has never smoked. She has never used  smokeless tobacco. She reports that she does not drink alcohol and does not use drugs.  Physical Exam: BP 135/72   Pulse 87   Ht 5' (1.524 m)   Wt 90 lb (40.8 kg)   BMI 17.58 kg/m   Constitutional:  Alert and oriented, no acute distress, nontoxic appearing HEENT: [place], AT Cardiovascular: No clubbing, cyanosis, or edema Respiratory: Normal respiratory effort, no increased work of breathing Skin: No rashes, bruises or suspicious lesions Neurologic: Grossly intact, no focal deficits, moving all 4 extremities Psychiatric: Normal mood and affect  Laboratory Data: Results for orders placed or performed in visit on 05/03/22  Microscopic Examination   Urine  Result Value Ref Range   WBC, UA >30 (A) 0 - 5 /hpf   RBC 3-10 (A) 0 - 2 /hpf   Epithelial Cells (non renal) 0-10  0 - 10 /hpf   Bacteria, UA Many (A) None seen/Few  Urinalysis, Complete  Result Value Ref Range   Specific Gravity, UA 1.015 1.005 - 1.030   pH, UA 7.0 5.0 - 7.5   Color, UA Yellow Yellow   Appearance Ur Cloudy (A) Clear   Leukocytes,UA 3+ (A) Negative   Protein,UA 1+ (A) Negative/Trace   Glucose, UA Negative Negative   Ketones, UA Negative Negative   RBC, UA 3+ (A) Negative   Bilirubin, UA Negative Negative   Urobilinogen, Ur 0.2 0.2 - 1.0 mg/dL   Nitrite, UA Negative Negative   Microscopic Examination See below:    Assessment & Plan:   1. Recurrent UTI UA again is grossly infected consistent with recurrent versus persistent UTI.  We will start empiric cefuroxime x7 days and send for culture for further evaluation.  With recent CT scan and cystoscopy, I do not think she requires further work-up for recurrent UTIs at this time.  I offered her 3 to 6 months of suppressive antibiotic therapy, and she would like to pursue this.  We will call her after I get her urine culture results to select a culture appropriate prophylactic antibiotic. - Urinalysis, Complete - CULTURE, URINE COMPREHENSIVE - cefUROXime (CEFTIN)  250 MG tablet; Take 1 tablet (250 mg total) by mouth 2 (two) times daily with a meal for 7 days.  Dispense: 14 tablet; Refill: 0  Return for Will call with results.  Debroah Loop, PA-C  [facility] [address], [place], [place] [address] [phone]

## 2022-05-04 ENCOUNTER — Other Ambulatory Visit: Payer: Medicare Other

## 2022-05-05 ENCOUNTER — Ambulatory Visit: Payer: Medicare Other | Attending: Internal Medicine | Admitting: Physical Therapy

## 2022-05-05 DIAGNOSIS — R262 Difficulty in walking, not elsewhere classified: Secondary | ICD-10-CM

## 2022-05-05 DIAGNOSIS — R2689 Other abnormalities of gait and mobility: Secondary | ICD-10-CM

## 2022-05-05 DIAGNOSIS — R2681 Unsteadiness on feet: Secondary | ICD-10-CM

## 2022-05-05 DIAGNOSIS — M6281 Muscle weakness (generalized): Secondary | ICD-10-CM

## 2022-05-05 NOTE — Therapy (Signed)
[place] MAIN [facility] SERVICES [address] [place], [place], [address] Phone: [phone]   Fax:  [phone]  Physical Therapy Treatment  Patient Details  Name: Kristina Rowe MRN: [id number] Date of Birth: 1929-12-17 Referring Provider (PT): Dr. Tressia Miners   Encounter Date: 05/05/2022   PT End of Session - 05/05/22 1436     Visit Number 21    Number of Visits 25    Date for PT Re-Evaluation 05/25/22    Authorization Type eval: 02/02/22    Progress Note Due on Visit 30    PT Start Time 1432    PT Stop Time 1515    PT Time Calculation (min) 43 min    Equipment Utilized During Treatment Gait belt    Activity Tolerance Patient tolerated treatment well    Behavior During Therapy [facility] for tasks assessed/performed             Past Medical History:  Diagnosis Date   Ductal carcinoma in situ (DCIS) of left breast    Dysphagia    GERD (gastroesophageal reflux disease)    Hiatal hernia    Hypertension    Iron deficiency anemia    Osteoporosis    Pulmonary embolism ([place])    Scoliosis    UTI (urinary tract infection)     Past Surgical History:  Procedure Laterality Date   BREAST LUMPECTOMY Left 2009   Ductal Carcinoma insitu    CATARACT EXTRACTION, BILATERAL     COLONOSCOPY N/A 03/17/2021   Procedure: COLONOSCOPY;  Surgeon: Lesly Rubenstein, MD;  Location: [facility] ENDOSCOPY;  Service: Endoscopy;  Laterality: N/A;   COLOSTOMY REVISION Right 03/18/2021   Procedure: COLON RESECTION RIGHT;  Surgeon: Olean Ree, MD;  Location: [facility] ORS;  Service: General;  Laterality: Right;   LAPAROSCOPIC PARAESOPHAGEAL HERNIA REPAIR  2009   LAPAROTOMY N/A 03/10/2021   Procedure: EXPLORATORY LAPAROTOMY;  Surgeon: Olean Ree, MD;  Location: [facility] ORS;  Service: General;  Laterality: N/A;   TUBAL LIGATION      There were no vitals filed for this visit.   Subjective Assessment - 05/05/22 1434     Subjective Pt reports she is on the third round of  antibiotics for her bladder infection. She is frustrated with this and will be on preventitive antibiotics in the future.    Pertinent History Pt underwent "2 major surgies" last April and reports a slow deterioration in her balance and strength since that time. She is fearful of falling and if she stands for an extended period of time her legs feel weak.  Pt [facility] to the [facility] for exercise twice/week. No falls in the last 6-12 months. She uses a single point cane in her RUE to help with her balance during ambulation. Patient lives in a single story townhome (one step to enter) independently. She has a walk-in shower with a seat and grab bars. She has chronic L low back pain and uses topical patches to manage. She takes Warfarin so she can only use Tylenol to help with her pain. She was recently started on low-dose Norvasc due to hypertension which has been effective at lowering her blood pressure readings.    Diagnostic tests None reported    Patient Stated Goals Improve balance and decrease risk for falls    Pain Onset More than a month ago             Exercise/Activity Sets/ Reps/Time/ Resistance Assistance Charge type Comments Unless otherwise stated, CGA was provided  and gait belt donned in order to ensure pt safety    Ambulation with AW X150 ft w/ 3# SPC / cga  Therex  Good pace and good foot clearance   Standing march   2*10 reps each LE 3 lb   Therex Visual cues for proper perfomrance   HS curl standing with 3# AW  2*10 ea side  therex Cues for upright posture   STS seated on airex pad  2*7 CGA Therex No UE support required         LAQ  2 x 10  reps x 3 #   Therex  Hands under thigh for support of hip flexors improved knee extension ROM.   Rocker board stance 3 x 45 sec  Intermittent UE, CGA  NMR Improved stability with intermittent UE  Cues for proper positioning   Airex adducted stance   2*30 seconds with small horizontal head turns  Intermittent UE, CGA  NMR  Good  stability noted, fatigue noted toward last repetition    LAQ  3 x 10 x 3 #   therex Cues for reps         Treatment provided this session   Pt educated throughout session about proper posture and technique with exercises. Improved exercise technique, movement at target joints, use of target muscles after min to mod verbal, visual, tactile cues.   Rationale for Evaluation and Treatment Rehabilitation                            PT Education - 05/05/22 1435     Education Details exercise form and technique    Person(s) Educated Patient    Methods Explanation    Comprehension Verbalized understanding              PT Short Term Goals - 03/10/22 1152       PT SHORT TERM GOAL #1   Title Pt will be independent with HEP in order to improve strength and balance in order to decrease fall risk and improve function at home.    Baseline Pt is completing HEP independently a tthis time.    Time 4    Period Weeks    Status New    Target Date 03/02/22      PT SHORT TERM GOAL #2   Title Pt will improve BERG by at least 3 points in order to demonstrate clinically significant improvement in balance.    Baseline 02/02/22: 37/56 03/10/22: 40/56    Time 4    Period Weeks    Status Achieved    Target Date 03/02/22               PT Long Term Goals - 04/27/22 1312       PT LONG TERM GOAL #1   Title Pt will improve BERG to at least 43/56 in order to demonstrate clinically significant improvement in balance and decreased risk for falls    Baseline 02/02/22: 37/56 4/13: 43    Time 8    Period Weeks    Status Achieved    Target Date 03/30/22      PT LONG TERM GOAL #2   Title Pt will increase self-selected 10MWT by at least 0.13 m/s in order to demonstrate clinically significant improvement in community ambulation.    Baseline 02/02/22: self-selected: 14.7s = 0.68 m/s 4/13 . 73 m/s 5/3: .97 m/s 5/31:.95 m/s    Time 8    Period  Weeks    Status Achieved    Target Date  05/25/22      PT LONG TERM GOAL #3   Title Pt will be able to perform sit to stand x 5 from regular height chair without UE use in order to improvement independence with transfers and decrease her risk for falls    Baseline 02/02/22: unable to perform 1 repetition, 4/13: 22.8 with UE on surface of chair 13.82 sec, 24.44 sec no UE 5/31: 25.1 s UE on knees    Time 8    Period Weeks    Status Achieved    Target Date 03/30/22      PT LONG TERM GOAL #4   Title Pt will decrease TUG to below 12 seconds in order to demonstrate decreased fall risk and improved functional mobility    Baseline 02/02/22: 14.8s 4/13: 18.48 sec 14.2 sec 5/31:13.67 sec    Time 8    Period Weeks    Status New    Target Date 05/25/22                   Plan - 05/05/22 1436     Clinical Impression Statement Pt presents to PT with excellent motivation for completion of PT interventions. Pt progresses with static balance interventions this session as well as with LE strengthening. Pt also progresses with ambulaiton training for speed and endurance with good response. Patient will continue to benefit from skilled physical therapy in order to improve her lower extremity strength, balance, reduce her risk of falls and improve her quality of life.    Personal Factors and Comorbidities Age;Comorbidity 3+    Comorbidities anemia, HTN, aortic stenosis, chronic back pain    Examination-Activity Limitations Bathing;Bend;Squat;Stairs;Stand;Transfers    Examination-Participation Restrictions Community Activity;Shop;Meal Prep    Stability/Clinical Decision Making Evolving/Moderate complexity    Rehab Potential Good    PT Frequency 1x / week    PT Duration 8 weeks    PT Treatment/Interventions ADLs/Self Care Home Management;Aquatic Therapy;Canalith Repostioning;Cryotherapy;Electrical Stimulation;Moist Heat;Iontophoresis '4mg'$ /ml Dexamethasone;Traction;Ultrasound;Gait training;Stair training;Functional mobility training;Therapeutic  activities;Therapeutic exercise;Balance training;Neuromuscular re-education;Cognitive remediation;Patient/family education;Manual techniques;Passive range of motion;Dry needling;Vestibular;Spinal Manipulations;Joint Manipulations    PT Next Visit Plan Continue with balance and core/LE strengthening, evaluate activities for safety with [employer] exercise class per pt report.    PT Home Exercise Plan Completing on days not in therapy    Consulted and Agree with Plan of Care Patient             Patient will benefit from skilled therapeutic intervention in order to improve the following deficits and impairments:  Abnormal gait, Decreased balance, Decreased mobility, Pain  Visit Diagnosis: Difficulty in walking, not elsewhere classified  Other abnormalities of gait and mobility  Unsteadiness on feet  Muscle weakness (generalized)     Problem List Patient Active Problem List   Diagnosis Date Noted   Basal cell carcinoma of leg 04/01/2021   Coronary atherosclerosis 04/01/2021   History of pulmonary embolism 04/01/2021   Pulmonary nodule 04/01/2021   Malignant neoplasm of colon ([place]) 03/29/2021   Rectal bleeding 03/15/2021   Hypertension    GERD (gastroesophageal reflux disease)    Colonic mass    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[place]) 03/10/2021   Anemia, unspecified 03/06/2021   Bronchiectasis without complication ([place]) 21/19/4174   Iron deficiency anemia 03/27/2020   Moderate aortic stenosis 03/27/2020   Stage 3a chronic kidney disease ([place]) 07/04/2019   Hypnic headache 05/28/2019   Telogen effluvium 04/17/2019   Xerosis cutis 04/17/2019  Essential hypertension 01/14/2019   Cervical radiculopathy 05/04/2018   Osteoarthritis of left glenohumeral joint 05/04/2018   Vitamin B12 deficiency 01/01/2018   Vascular abnormality 09/11/2017   Dyspepsia 03/28/2017   Pulmonary embolism ([place]) 08/16/2016   Dilated pore of [place] 03/23/2015   Retinal drusen of both eyes 08/28/2014   Status post  right cataract extraction 07/30/2014   Verruca 03/19/2014   Chronic back pain 11/06/2013   Postmenopausal osteoporosis 06/11/2013   Anticoagulated on Coumadin 04/06/2013   Long term (current) use of anticoagulants 10/18/2012   Preglaucoma 05/10/2012   Presbyopia 05/10/2012   Senile nuclear sclerosis 05/10/2012   Lobular carcinoma in situ of breast 04/13/2012   Closed fracture of ankle 02/27/2012   History of nonmelanoma skin cancer 09/05/2011   Other benign neoplasm of skin of trunk 09/05/2011   Osteoporosis 08/25/2011   Transient alteration of awareness 09/03/2009   Colon adenoma 07/30/2002    Particia Lather, PT 05/05/2022, 3:17 PM  [place] MAIN [facility] SERVICES [address] [place], [place], [address] Phone: [phone]   Fax:  [phone]  Name: Kristina Rowe MRN: [id number] Date of Birth: 1930/09/25

## 2022-05-07 LAB — CULTURE, URINE COMPREHENSIVE

## 2022-05-10 ENCOUNTER — Ambulatory Visit: Payer: Medicare Other | Admitting: Physical Therapy

## 2022-05-11 ENCOUNTER — Ambulatory Visit: Payer: Medicare Other | Admitting: Physical Therapy

## 2022-05-12 ENCOUNTER — Ambulatory Visit: Payer: Medicare Other | Admitting: Physical Therapy

## 2022-05-13 ENCOUNTER — Ambulatory Visit (INDEPENDENT_AMBULATORY_CARE_PROVIDER_SITE_OTHER): Payer: Medicare Other | Admitting: Physician Assistant

## 2022-05-13 VITALS — BP 139/73 | HR 80 | Ht 60.0 in | Wt 90.0 lb

## 2022-05-13 DIAGNOSIS — Z87440 Personal history of urinary (tract) infections: Secondary | ICD-10-CM | POA: Diagnosis not present

## 2022-05-13 DIAGNOSIS — R300 Dysuria: Secondary | ICD-10-CM | POA: Diagnosis not present

## 2022-05-13 DIAGNOSIS — R8281 Pyuria: Secondary | ICD-10-CM | POA: Diagnosis not present

## 2022-05-13 DIAGNOSIS — R350 Frequency of micturition: Secondary | ICD-10-CM | POA: Diagnosis not present

## 2022-05-13 DIAGNOSIS — R3915 Urgency of urination: Secondary | ICD-10-CM

## 2022-05-13 DIAGNOSIS — N390 Urinary tract infection, site not specified: Secondary | ICD-10-CM

## 2022-05-13 LAB — URINALYSIS, COMPLETE
Bilirubin, UA: NEGATIVE
Glucose, UA: NEGATIVE
Ketones, UA: NEGATIVE
Nitrite, UA: NEGATIVE
Protein,UA: NEGATIVE
Specific Gravity, UA: <1.005 — ABNORMAL LOW (ref 1.005–1.030)
Urobilinogen, Ur: 0.2 mg/dL (ref 0.2–1.0)
pH, UA: 6 (ref 5.0–7.5)

## 2022-05-13 LAB — MICROSCOPIC EXAMINATION: WBC, UA: >30 /hpf — AB (ref 0–5)

## 2022-05-13 MED ORDER — TRIMETHOPRIM 100 MG PO TABS: 100.0000 mg | ORAL_TABLET | Freq: Every day | ORAL | 5 refills | Status: DC

## 2022-05-13 NOTE — Progress Notes (Unsigned)
05/13/2022 12:30 PM   Kristina Rowe 02-Dec-1929 [id number]  CC: Chief Complaint  Patient presents with   Urinary Tract Infection   HPI: Kristina Rowe is a 88 y.o. female with PMH OAB wet previously on tolterodine who discontinued Myrbetriq due to cost and recurrent UTI on topical vaginal estrogen cream with no significant findings on cystoscopy or CT in 2022 who presents today for evaluation of possible persistent versus recurrent UTI.  I saw her in clinic most recently 10 days ago.  Her UA was grossly positive and I started 7 days of empiric cefuroxime.  Urine culture finalized with Macrobid and tetracycline resistant Proteus mirabilis.  Today she reports that her urinary symptoms of dysuria, frequency, and urgency completely resolved after her most recent course of antibiotics.  They returned last night and were most notable for complete loss of urinary control as well as a little bit of dysuria.   In-office UA today positive for trace intact blood and 1+ leukocyte esterase; urine microscopy with >30 WBCs/HPF and few bacteria.  PMH: Past Medical History:  Diagnosis Date   Ductal carcinoma in situ (DCIS) of left breast    Dysphagia    GERD (gastroesophageal reflux disease)    Hiatal hernia    Hypertension    Iron deficiency anemia    Osteoporosis    Pulmonary embolism (HCC)    Scoliosis    UTI (urinary tract infection)     Surgical History: Past Surgical History:  Procedure Laterality Date   BREAST LUMPECTOMY Left 2009   Ductal Carcinoma insitu    CATARACT EXTRACTION, BILATERAL     COLONOSCOPY N/A 03/17/2021   Procedure: COLONOSCOPY;  Surgeon: Lesly Rubenstein, MD;  Location: [facility];  Service: Endoscopy;  Laterality: N/A;   COLOSTOMY REVISION Right 03/18/2021   Procedure: COLON RESECTION RIGHT;  Surgeon: Olean Ree, MD;  Location: [facility] OR[facility];  Service: General;  Laterality: Right;   LAPAROSCOPIC PARAESOPHAGEAL HERNIA REPAIR  2009   LAPAROTOMY N/A  03/10/2021   Procedure: EXPLORATORY LAPAROTOMY;  Surgeon: Olean Ree, MD;  Location: [facility] OR[facility];  Service: General;  Laterality: N/A;   TUBAL LIGATION      Home Medications:  Allergies as of 05/13/2022       Reactions   Metronidazole Other (See Comments)   Other reaction(s): Other Mouth sores Mouth sores Mouth sores   Omeprazole Other (See Comments)   Other reaction(s): Arthralgias (intolerance) Other reaction(s): Other   Bactrim [sulfamethoxazole-trimethoprim] Nausea Only   Codeine Nausea And Vomiting, Nausea Only   Other reaction(s): Nausea Only But tolerates tramadol        Medication List        Accurate as of May 13, 2022 12:30 PM. If you have any questions, ask your nurse or doctor.          acetaminophen 500 MG tablet Commonly known as: TYLENOL Take 500 mg by mouth at bedtime as needed for mild pain, fever or headache.   amLODipine 2.5 MG tablet Commonly known as: NORVASC Take 2.5 mg by mouth daily.   ascorbic acid 250 MG tablet Commonly known as: VITAMIN C Take by mouth.   calcium carbonate 1250 (500 Ca) MG chewable tablet Commonly known as: OS-CAL Chew 1 tablet by mouth daily.   Cholecalciferol 50 MCG (2000 UT) Caps Take by mouth.   cyanocobalamin 1000 MCG tablet Take 1,000 mcg by mouth daily.   diclofenac Sodium 1 % Gel Commonly known as: VOLTAREN Apply topically 4 (four) times daily.  econazole nitrate 1 % cream Apply 1 application topically daily.   estradiol 0.1 MG/GM vaginal cream Commonly known as: ESTRACE Estrogen Cream Instruction Discard applicator Apply pea sized amount to tip of finger to urethra before bed. Wash hands well after application. Use Monday, Wednesday and Friday   famotidine 40 MG tablet Commonly known as: PEPCID Take 40 mg by mouth 2 (two) times daily.   folic acid 1 MG tablet Commonly known as: FOLVITE Take 1 mg by mouth daily.   loperamide 2 MG capsule Commonly known as: IMODIUM Take 2 mg by mouth as  needed for diarrhea or loose stools.   mirtazapine 30 MG tablet Commonly known as: REMERON Take 15 mg by mouth at bedtime.   mirtazapine 15 MG tablet Commonly known as: REMERON Take 15 mg by mouth at bedtime.   ondansetron 4 MG disintegrating tablet Commonly known as: Zofran ODT Take 1 tablet (4 mg total) by mouth every 8 (eight) hours as needed for nausea or vomiting.   pantoprazole 40 MG tablet Commonly known as: PROTONIX Take 40 mg by mouth daily.   promethazine 25 MG tablet Commonly known as: PHENERGAN Take 25 mg by mouth every 6 (six) hours as needed for nausea or vomiting.   trimethoprim 100 MG tablet Commonly known as: TRIMPEX Take 1 tablet (100 mg total) by mouth daily.   warfarin 2.5 MG tablet Commonly known as: Coumadin On Sundays and Thursdays resume the 2.5 mg dose.  All other days take 5 mg. What changed:  how much to take how to take this when to take this additional instructions   warfarin 5 MG tablet Commonly known as: COUMADIN Take by mouth. What changed: Another medication with the same name was changed. Make sure you understand how and when to take each.        Allergies:  Allergies  Allergen Reactions   Metronidazole Other (See Comments)    Other reaction(s): Other Mouth sores Mouth sores Mouth sores    Omeprazole Other (See Comments)    Other reaction(s): Arthralgias (intolerance) Other reaction(s): Other    Bactrim [Sulfamethoxazole-Trimethoprim] Nausea Only   Codeine Nausea And Vomiting and Nausea Only    Other reaction(s): Nausea Only But tolerates tramadol     Family History: Family History  Problem Relation Age of Onset   Breast cancer Neg Hx     Social History:   reports that she has never smoked. She has never used smokeless tobacco. She reports that she does not drink alcohol and does not use drugs.  Physical Exam: BP 139/73   Pulse 80   Ht 5' (1.524 m)   Wt 90 lb (40.8 kg)   BMI 17.58 kg/m   Constitutional:   Alert and oriented, no acute distress, nontoxic appearing HEENT: [place], AT Cardiovascular: No clubbing, cyanosis, or edema Respiratory: Normal respiratory effort, no increased work of breathing Skin: No rashes, bruises or suspicious lesions Neurologic: Grossly intact, no focal deficits, moving all 4 extremities Psychiatric: Normal mood and affect  Laboratory Data: Results for orders placed or performed in visit on 05/03/22  CULTURE, URINE COMPREHENSIVE   Specimen: Urine   UR  Result Value Ref Range   Urine Culture, Comprehensive Final report (A)    Organism ID, Bacteria Proteus mirabilis (A)    ANTIMICROBIAL SUSCEPTIBILITY Comment   Microscopic Examination   Urine  Result Value Ref Range   WBC, UA >30 (A) 0 - 5 /hpf   RBC 3-10 (A) 0 - 2 /hpf   Epithelial  Cells (non renal) 0-10 0 - 10 /hpf   Bacteria, UA Many (A) None seen/Few  Urinalysis, Complete  Result Value Ref Range   Specific Gravity, UA 1.015 1.005 - 1.030   pH, UA 7.0 5.0 - 7.5   Color, UA Yellow Yellow   Appearance Ur Cloudy (A) Clear   Leukocytes,UA 3+ (A) Negative   Protein,UA 1+ (A) Negative/Trace   Glucose, UA Negative Negative   Ketones, UA Negative Negative   RBC, UA 3+ (A) Negative   Bilirubin, UA Negative Negative   Urobilinogen, Ur 0.2 0.2 - 1.0 mg/dL   Nitrite, UA Negative Negative   Microscopic Examination See below:    Assessment & Plan:   1. Recurrent UTI Compared to prior, her bacteriuria has significantly improved though she still does have pyuria.  We discussed that this could represent recurrent versus persistent UTI or postinfectious bladder inflammation.  In shared decision-making with the patient, we elected to initiate her previously agreed upon 78-monthcourse of suppressive trimethoprim, though I will send her urine for culture today and if this is positive we will interrupt suppressive therapy for treatment course of culture appropriate antibiotics.  She reports nausea with Bactrim use, but  states she has some nausea at baseline especially in the mornings.  She is willing to try trimethoprim, however if she poorly tolerates this we can switch to Keflex. - Urinalysis, Complete - CULTURE, URINE COMPREHENSIVE - trimethoprim (TRIMPEX) 100 MG tablet; Take 1 tablet (100 mg total) by mouth daily.  Dispense: 30 tablet; Refill: 5  Return for Will call with results.  SDebroah Loop PA-C  [facility] [address] [place] [phone]

## 2022-05-16 ENCOUNTER — Other Ambulatory Visit: Payer: Self-pay | Admitting: *Deleted

## 2022-05-16 ENCOUNTER — Ambulatory Visit
Admission: RE | Admit: 2022-05-16 | Discharge: 2022-05-16 | Disposition: A | Discharge status: Home / Self Care | Payer: Medicare Other | Source: Ambulatory Visit | Attending: Oncology | Admitting: Oncology

## 2022-05-16 DIAGNOSIS — D509 Iron deficiency anemia, unspecified: Secondary | ICD-10-CM | POA: Diagnosis not present

## 2022-05-16 DIAGNOSIS — Z85038 Personal history of other malignant neoplasm of large intestine: Secondary | ICD-10-CM | POA: Diagnosis present

## 2022-05-16 DIAGNOSIS — Z08 Encounter for follow-up examination after completed treatment for malignant neoplasm: Secondary | ICD-10-CM | POA: Diagnosis present

## 2022-05-16 LAB — CULTURE, URINE COMPREHENSIVE

## 2022-05-16 MED ORDER — FOSFOMYCIN TROMETHAMINE 3 G PO PACK: 3.0000 g | PACK | Freq: Once | ORAL | 0 refills | Status: AC

## 2022-05-17 ENCOUNTER — Other Ambulatory Visit: Payer: Self-pay

## 2022-05-17 ENCOUNTER — Ambulatory Visit: Payer: Medicare Other | Admitting: Physical Therapy

## 2022-05-17 ENCOUNTER — Other Ambulatory Visit: Payer: Self-pay | Admitting: *Deleted

## 2022-05-17 ENCOUNTER — Inpatient Hospital Stay: Payer: Medicare Other | Attending: Oncology

## 2022-05-17 DIAGNOSIS — Z79899 Other long term (current) drug therapy: Secondary | ICD-10-CM | POA: Diagnosis not present

## 2022-05-17 DIAGNOSIS — Z08 Encounter for follow-up examination after completed treatment for malignant neoplasm: Secondary | ICD-10-CM | POA: Diagnosis not present

## 2022-05-17 DIAGNOSIS — Z85038 Personal history of other malignant neoplasm of large intestine: Secondary | ICD-10-CM | POA: Insufficient documentation

## 2022-05-17 DIAGNOSIS — D509 Iron deficiency anemia, unspecified: Secondary | ICD-10-CM | POA: Diagnosis present

## 2022-05-17 LAB — CBC WITH DIFFERENTIAL/PLATELET
Abs Immature Granulocytes: 0.01 10*3/uL (ref 0.00–0.07)
Basophils Absolute: 0 10*3/uL (ref 0.0–0.1)
Basophils Relative: 1 %
Eosinophils Absolute: 0.1 10*3/uL (ref 0.0–0.5)
Eosinophils Relative: 1 %
HCT: 40.1 % (ref 36.0–46.0)
Hemoglobin: 12.6 g/dL (ref 12.0–15.0)
Immature Granulocytes: 0 %
Lymphocytes Relative: 18 %
Lymphs Abs: 1.1 10*3/uL (ref 0.7–4.0)
MCH: 31.7 pg (ref 26.0–34.0)
MCHC: 31.4 g/dL (ref 30.0–36.0)
MCV: 100.8 fL — ABNORMAL HIGH (ref 80.0–100.0)
Monocytes Absolute: 0.4 10*3/uL (ref 0.1–1.0)
Monocytes Relative: 7 %
Neutro Abs: 4.4 10*3/uL (ref 1.7–7.7)
Neutrophils Relative %: 73 %
Platelets: 236 10*3/uL (ref 150–400)
RBC: 3.98 MIL/uL (ref 3.87–5.11)
RDW: 13.7 % (ref 11.5–15.5)
WBC: 6 10*3/uL (ref 4.0–10.5)
nRBC: 0 % (ref 0.0–0.2)

## 2022-05-17 LAB — COMPREHENSIVE METABOLIC PANEL
ALT: 16 U/L (ref 0–44)
AST: 28 U/L (ref 15–41)
Albumin: 4.3 g/dL (ref 3.5–5.0)
Alkaline Phosphatase: 69 U/L (ref 38–126)
Anion gap: 7 (ref 5–15)
BUN: 16 mg/dL (ref 8–23)
CO2: 30 mmol/L (ref 22–32)
Calcium: 9.5 mg/dL (ref 8.9–10.3)
Chloride: 98 mmol/L (ref 98–111)
Creatinine, Ser: 0.94 mg/dL (ref 0.44–1.00)
GFR, Estimated: 58 mL/min — ABNORMAL LOW (ref >60)
Glucose, Bld: 123 mg/dL — ABNORMAL HIGH (ref 70–99)
Potassium: 4 mmol/L (ref 3.5–5.1)
Sodium: 135 mmol/L (ref 135–145)
Total Bilirubin: 0.5 mg/dL (ref 0.3–1.2)
Total Protein: 8 g/dL (ref 6.5–8.1)

## 2022-05-17 LAB — FERRITIN: Ferritin: 21 ng/mL (ref 11–307)

## 2022-05-17 LAB — IRON AND TIBC
Iron: 120 ug/dL (ref 28–170)
Saturation Ratios: 32 % — ABNORMAL HIGH (ref 10.4–31.8)
TIBC: 379 ug/dL (ref 250–450)
UIBC: 259 ug/dL

## 2022-05-18 ENCOUNTER — Inpatient Hospital Stay (HOSPITAL_BASED_OUTPATIENT_CLINIC_OR_DEPARTMENT_OTHER): Payer: Medicare Other | Admitting: Oncology

## 2022-05-18 ENCOUNTER — Other Ambulatory Visit: Payer: Medicare Other

## 2022-05-18 VITALS — BP 112/72 | HR 74 | Resp 18 | Ht 60.0 in | Wt 95.0 lb

## 2022-05-18 DIAGNOSIS — Z85038 Personal history of other malignant neoplasm of large intestine: Secondary | ICD-10-CM | POA: Diagnosis not present

## 2022-05-18 DIAGNOSIS — Z08 Encounter for follow-up examination after completed treatment for malignant neoplasm: Secondary | ICD-10-CM | POA: Diagnosis not present

## 2022-05-18 DIAGNOSIS — D509 Iron deficiency anemia, unspecified: Secondary | ICD-10-CM

## 2022-05-18 LAB — CEA: CEA: 1.9 ng/mL (ref 0.0–4.7)

## 2022-05-18 NOTE — Progress Notes (Signed)
Hematology/Oncology Consult note [facility]  Telephone:([phone] [phone]  Patient Care Team: Gladstone Lighter, MD as PCP - General (Internal Medicine) Kate Sable, MD as PCP - Cardiology (Cardiology) Sindy Guadeloupe, MD as Consulting Physician (Hematology and Oncology)   Name of the patient: Kristina Rowe  [id number]  September 25, 1930   Date of visit: 05/18/22  Diagnosis- stage III colon cancer  Chief complaint/ Reason for visit-routine follow-up of colon cancer and iron deficiency anemia  Heme/Onc history: Patient is a 88 year old female who initially presented to the ER with symptoms of significant abdominal pain.  CT abdomen and pelvis on 03/10/2021 showed volvulus along with a large hiatal hernia.  Patient initially had an exploratory laparotomy with lysis of adhesion by Dr. Hampton Abbot.  She then presented back to the ER with similar complaints and a repeat CT abdomen at that time showed bulky ascending colon mass compatible with carcinoma and possible local regional adenopathy.  No recurrent volvulus.  Patient underwent open right colectomy on 03/18/2021.  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  All regional lymph nodes 32 of them were negative for tumor but there was a tumor deposit present at 1 site.  PT3PN1C.   Risks versus benefits of adjuvant chemotherapy was discussed and patient was not deemed to be a candidate for adjuvant chemotherapy and she did not wish to go through chemotherapy herself.  Anemia work-up was consistent with iron deficiency and patient received 2 doses of Feraheme  Interval history-she is doing well for her age and denies any specific complaints at this time.  Appetite and weight have remained stable.  Denies any abdominal pain.  Denies any blood loss in her stool or urine.  Denies any dark melanotic stools.  Has mild baseline fatigue.  ECOG  PS- 2 Pain scale- 0   Review of systems- Review of Systems  Constitutional:  Positive for malaise/fatigue. Negative for chills, fever and weight loss.  HENT:  Negative for congestion, ear discharge and nosebleeds.   Eyes:  Negative for blurred vision.  Respiratory:  Negative for cough, hemoptysis, sputum production, shortness of breath and wheezing.   Cardiovascular:  Negative for chest pain, palpitations, orthopnea and claudication.  Gastrointestinal:  Negative for abdominal pain, blood in stool, constipation, diarrhea, heartburn, melena, nausea and vomiting.  Genitourinary:  Negative for dysuria, flank pain, frequency, hematuria and urgency.  Musculoskeletal:  Negative for back pain, joint pain and myalgias.  Skin:  Negative for rash.  Neurological:  Negative for dizziness, tingling, focal weakness, seizures, weakness and headaches.  Endo/Heme/Allergies:  Does not bruise/bleed easily.  Psychiatric/Behavioral:  Negative for depression and suicidal ideas. The patient does not have insomnia.       Allergies  Allergen Reactions   Metronidazole Other (See Comments)    Other reaction(s): Other Mouth sores Mouth sores Mouth sores    Omeprazole Other (See Comments)    Other reaction(s): Arthralgias (intolerance) Other reaction(s): Other    Bactrim [Sulfamethoxazole-Trimethoprim] Nausea Only   Codeine Nausea And Vomiting and Nausea Only    Other reaction(s): Nausea Only But tolerates tramadol      Past Medical History:  Diagnosis Date   Ductal carcinoma in situ (DCIS) of left breast    Dysphagia    GERD (gastroesophageal reflux disease)    Hiatal hernia    Hypertension    Iron deficiency anemia    Osteoporosis    Pulmonary embolism ([place])    Scoliosis  UTI (urinary tract infection)      Past Surgical History:  Procedure Laterality Date   BREAST LUMPECTOMY Left 2009   Ductal Carcinoma insitu    CATARACT EXTRACTION, BILATERAL     COLONOSCOPY N/A 03/17/2021    Procedure: COLONOSCOPY;  Surgeon: Lesly Rubenstein, MD;  Location: [facility];  Service: Endoscopy;  Laterality: N/A;   COLOSTOMY REVISION Right 03/18/2021   Procedure: COLON RESECTION RIGHT;  Surgeon: Olean Ree, MD;  Location: [facility] OR[facility];  Service: General;  Laterality: Right;   LAPAROSCOPIC PARAESOPHAGEAL HERNIA REPAIR  2009   LAPAROTOMY N/A 03/10/2021   Procedure: EXPLORATORY LAPAROTOMY;  Surgeon: Olean Ree, MD;  Location: [facility] OR[facility];  Service: General;  Laterality: N/A;   TUBAL LIGATION      Social History   Socioeconomic History   Marital status: Married    Spouse name: Not on file   Number of children: Not on file   Years of education: Not on file   Highest education level: Not on file  Occupational History   Not on file  Tobacco Use   Smoking status: Never   Smokeless tobacco: Never  Vaping Use   Vaping Use: Never used  Substance and Sexual Activity   Alcohol use: Never   Drug use: Never   Sexual activity: Not Currently  Other Topics Concern   Not on file  Social History Narrative   Not on file   Social Determinants of Health   Financial Resource Strain: Not on file  Food Insecurity: Not on file  Transportation Needs: Not on file  Physical Activity: Not on file  Stress: Not on file  Social Connections: Not on file  Intimate Partner Violence: Not on file    Family History  Problem Relation Age of Onset   Breast cancer Neg Hx      Current Outpatient Medications:    acetaminophen (TYLENOL) 500 MG tablet, Take 500 mg by mouth at bedtime as needed for mild pain, fever or headache., Disp: , Rfl:    amLODipine (NORVASC) 2.5 MG tablet, Take 2.5 mg by mouth daily., Disp: , Rfl:    Cholecalciferol 50 MCG (2000 UT) CAPS, Take by mouth., Disp: , Rfl:    diclofenac Sodium (VOLTAREN) 1 % GEL, Apply topically 4 (four) times daily., Disp: , Rfl:    econazole nitrate 1 % cream, Apply 1 application topically daily., Disp: , Rfl:    estradiol (ESTRACE) 0.1 MG/GM  vaginal cream, Estrogen Cream Instruction Discard applicator Apply pea sized amount to tip of finger to urethra before bed. Wash hands well after application. Use Monday, Wednesday and Friday, Disp: 42.5 g, Rfl: 3   Ferrous Sulfate (IRON PO), Take by mouth. Iron bisglycinate, Disp: , Rfl:    folic acid (FOLVITE) 1 MG tablet, Take 1 mg by mouth daily., Disp: , Rfl:    loperamide (IMODIUM) 2 MG capsule, Take 2 mg by mouth as needed for diarrhea or loose stools., Disp: , Rfl:    mirtazapine (REMERON) 15 MG tablet, Take 15 mg by mouth at bedtime., Disp: , Rfl:    promethazine (PHENERGAN) 25 MG tablet, Take 25 mg by mouth every 6 (six) hours as needed for nausea or vomiting., Disp: , Rfl:    trimethoprim (TRIMPEX) 100 MG tablet, Take 1 tablet (100 mg total) by mouth daily., Disp: 30 tablet, Rfl: 5   warfarin (COUMADIN) 2.5 MG tablet, On Sundays and Thursdays resume the 2.5 mg dose.  All other days take 5 mg. (Patient taking differently: Take 2.5-5  mg by mouth daily at 12 noon. 2.5 mg sun Thursday, all other days is '5mg'$ ), Disp: 30 tablet, Rfl: 11   warfarin (COUMADIN) 5 MG tablet, Take by mouth., Disp: , Rfl:    ascorbic acid (VITAMIN C) 250 MG tablet, Take by mouth. (Patient not taking: Reported on 05/18/2022), Disp: , Rfl:    calcium carbonate (OS-CAL) 1250 (500 Ca) MG chewable tablet, Chew 1 tablet by mouth daily. (Patient not taking: Reported on 05/18/2022), Disp: , Rfl:    cyanocobalamin 1000 MCG tablet, Take 1,000 mcg by mouth daily. (Patient not taking: Reported on 05/18/2022), Disp: , Rfl:    famotidine (PEPCID) 40 MG tablet, Take 40 mg by mouth 2 (two) times daily. (Patient not taking: Reported on 05/18/2022), Disp: , Rfl:    fosfomycin (MONUROL) 3 g PACK, SMARTSIG:3 Gram(s) By Mouth Once (Patient not taking: Reported on 05/18/2022), Disp: , Rfl:    mirtazapine (REMERON) 30 MG tablet, Take 15 mg by mouth at bedtime. (Patient not taking: Reported on 05/18/2022), Disp: , Rfl:    ondansetron (ZOFRAN ODT) 4  MG disintegrating tablet, Take 1 tablet (4 mg total) by mouth every 8 (eight) hours as needed for nausea or vomiting. (Patient not taking: Reported on 05/18/2022), Disp: 20 tablet, Rfl: 0   pantoprazole (PROTONIX) 40 MG tablet, Take 40 mg by mouth daily. (Patient not taking: Reported on 05/18/2022), Disp: , Rfl:   Physical exam:  Vitals:   05/18/22 1157  BP: 112/72  Pulse: 74  Resp: 18  SpO2: 100%  Weight: 95 lb (43.1 kg)  Height: 5' (1.524 m)   Physical Exam Constitutional:      Comments: Thin elderly frail woman in no acute distress  Cardiovascular:     Rate and Rhythm: Normal rate and regular rhythm.     Heart sounds: Normal heart sounds.  Pulmonary:     Effort: Pulmonary effort is normal.  Abdominal:     General: Bowel sounds are normal.     Palpations: Abdomen is soft.  Skin:    General: Skin is warm and dry.  Neurological:     Mental Status: She is alert and oriented to person, place, and time.         Latest Ref Rng & Units 05/17/2022    2:30 PM  CMP  Glucose 70 - 99 mg/dL 123   BUN 8 - 23 mg/dL 16   Creatinine 0.44 - 1.00 mg/dL 0.94   Sodium 135 - 145 mmol/L 135   Potassium 3.5 - 5.1 mmol/L 4.0   Chloride 98 - 111 mmol/L 98   CO2 22 - 32 mmol/L 30   Calcium 8.9 - 10.3 mg/dL 9.5   Total Protein 6.5 - 8.1 g/dL 8.0   Total Bilirubin 0.3 - 1.2 mg/dL 0.5   Alkaline Phos 38 - 126 U/L 69   AST 15 - 41 U/L 28   ALT 0 - 44 U/L 16       Latest Ref Rng & Units 05/17/2022    2:30 PM  CBC  WBC 4.0 - 10.5 K/uL 6.0   Hemoglobin 12.0 - 15.0 g/dL 12.6   Hematocrit 36.0 - 46.0 % 40.1   Platelets 150 - 400 K/uL 236     No images are attached to the encounter.  CT CHEST ABDOMEN PELVIS WO CONTRAST  Result Date: 05/17/2022 CLINICAL DATA:  Colon cancer restaging, status post partial right hemicolectomy * Tracking Code: BO * EXAM: CT CHEST, ABDOMEN AND PELVIS WITHOUT CONTRAST TECHNIQUE: Multidetector CT imaging  of the chest, abdomen and pelvis was performed following the  standard protocol without IV contrast. Oral enteric contrast was administered. RADIATION DOSE REDUCTION: This exam was performed according to the departmental dose-optimization program which includes automated exposure control, adjustment of the mA and/or kV according to patient size and/or use of iterative reconstruction technique. COMPARISON:  11/18/2021 FINDINGS: CT CHEST FINDINGS Cardiovascular: Aortic atherosclerosis. Aortic valve calcifications. Normal heart size. Left coronary artery calcifications no pericardial effusion. Mediastinum/Nodes: No enlarged mediastinal, hilar, or axillary lymph nodes. Moderate hiatal hernia with intrathoracic position of the gastric fundus. Thyroid gland, trachea, and esophagus demonstrate no significant findings. Lungs/Pleura: Lungs are clear. Scarring and atelectasis of the bilateral lung bases. No pleural effusion or pneumothorax. Musculoskeletal: No chest wall mass or suspicious osseous lesions identified. Severe levoscoliosis of the thoracolumbar spine CT ABDOMEN PELVIS FINDINGS Hepatobiliary: No solid liver abnormality is seen. No gallstones, gallbladder wall thickening, or biliary dilatation. Pancreas: Unremarkable. No pancreatic ductal dilatation or surrounding inflammatory changes. Spleen: Normal in size without significant abnormality. Adrenals/Urinary Tract: Adrenal glands are unremarkable. Kidneys are normal, without renal calculi, solid lesion, or hydronephrosis. Bladder is unremarkable. Stomach/Bowel: Stomach is within normal limits. Status post right hemicolectomy and ileocolic anastomosis. No evidence of bowel wall thickening, distention, or inflammatory changes. Vascular/Lymphatic: Aortic atherosclerosis. No enlarged abdominal or pelvic lymph nodes. Reproductive: No mass or other abnormality. Other: No abdominal wall hernia or abnormality. No ascites. Musculoskeletal: No acute osseous findings. IMPRESSION: 1. Status post right hemicolectomy and ileocolic  anastomosis. 2. No noncontrast evidence of current or metastatic disease in the chest, abdomen, or pelvis. 3. Hiatal hernia. 4. Coronary artery calcifications. 5. Aortic valve calcifications. Correlate for echocardiographic evidence of aortic valve dysfunction. Aortic Atherosclerosis (ICD10-I70.0). Electronically Signed   By: Delanna Ahmadi M.D.   On: 05/17/2022 15:08     Assessment and plan- Patient is a 88 y.o. female who is here for follow-up of following issues:  History of stage III colon cancer s/p surgery in April 2022.  She did not receive any adjuvant chemotherapy due to her age.  She is now 1 year out and CT scans showed no evidence of distant metastatic disease or locally recurrent disease.  I will repeat his scan again in 6 months times with labs and see her at that time.  Iron deficiency anemia: Patient is not presently anemic with a hemoglobin of 12.6.  However ferritin levels have been low at 21 and 25 respectively over the last 3 months.  She would like to proceed with IV iron at this time and we will schedule her for 2 doses of Feraheme with repeat CBC ferritin and iron studies in 3 months   Visit Diagnosis 1. Iron deficiency anemia, unspecified iron deficiency anemia type   2. Encounter for follow-up surveillance of colon cancer      Dr. Randa Evens, MD, MPH [facility] [phone] 05/18/2022 3:27 PM

## 2022-05-18 NOTE — Progress Notes (Signed)
Patient concerns today: SOB, does she still need to take Vit B12?, nausea and post nasal drip, recurrent UTI, INR 2.8 this morning, back pain, needs a refill on Zofran and she does see her PCP today at 1:00 so she will ask about some of these things as well.

## 2022-05-19 ENCOUNTER — Ambulatory Visit: Payer: Medicare Other | Admitting: Physical Therapy

## 2022-05-19 DIAGNOSIS — R2689 Other abnormalities of gait and mobility: Secondary | ICD-10-CM

## 2022-05-19 DIAGNOSIS — R262 Difficulty in walking, not elsewhere classified: Secondary | ICD-10-CM

## 2022-05-19 DIAGNOSIS — R2681 Unsteadiness on feet: Secondary | ICD-10-CM

## 2022-05-19 DIAGNOSIS — M6281 Muscle weakness (generalized): Secondary | ICD-10-CM

## 2022-05-19 NOTE — Therapy (Signed)
OUTPATIENT PHYSICAL THERAPY TREATMENT NOTE   Patient Name: Kristina Rowe MRN: [id number] DOB:Mar 27, 1930, 88 y.o., female Today's Date: 05/19/2022  PCP: Gladstone Lighter, MD REFERRING PROVIDER: Gladstone Lighter, MD   PT End of Session - 05/19/22 1149     Visit Number 22    Number of Visits 25    Date for PT Re-Evaluation 05/25/22    Authorization Type eval: 02/02/22    Progress Note Due on Visit 30    PT Start Time 1149    PT Stop Time 1230    PT Time Calculation (min) 41 min    Equipment Utilized During Treatment Gait belt    Activity Tolerance Patient tolerated treatment well    Behavior During Therapy WFL for tasks assessed/performed             Past Medical History:  Diagnosis Date   Ductal carcinoma in situ (DCIS) of left breast    Dysphagia    GERD (gastroesophageal reflux disease)    Hiatal hernia    Hypertension    Iron deficiency anemia    Osteoporosis    Pulmonary embolism ([place])    Scoliosis    UTI (urinary tract infection)    Past Surgical History:  Procedure Laterality Date   BREAST LUMPECTOMY Left 2009   Ductal Carcinoma insitu    CATARACT EXTRACTION, BILATERAL     COLONOSCOPY N/A 03/17/2021   Procedure: COLONOSCOPY;  Surgeon: Lesly Rubenstein, MD;  Location: [facility] END[facility];  Service: Endoscopy;  Laterality: N/A;   COLOSTOMY REVISION Right 03/18/2021   Procedure: COLON RESECTION RIGHT;  Surgeon: Olean Ree, MD;  Location: [facility] ORS;  Service: General;  Laterality: Right;   LAPAROSCOPIC PARAESOPHAGEAL HERNIA REPAIR  2009   LAPAROTOMY N/A 03/10/2021   Procedure: EXPLORATORY LAPAROTOMY;  Surgeon: Olean Ree, MD;  Location: [facility] ORS;  Service: General;  Laterality: N/A;   TUBAL LIGATION     Patient Active Problem List   Diagnosis Date Noted   Basal cell carcinoma of leg 04/01/2021   Coronary atherosclerosis 04/01/2021   History of pulmonary embolism 04/01/2021   Pulmonary nodule 04/01/2021   Malignant neoplasm of colon ([place]) 03/29/2021    Rectal bleeding 03/15/2021   Hypertension    GERD (gastroesophageal reflux disease)    Colonic mass    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[place]) 03/10/2021   Anemia, unspecified 03/06/2021   Bronchiectasis without complication ([place]) 85/27/7824   Iron deficiency anemia 03/27/2020   Moderate aortic stenosis 03/27/2020   Stage 3a chronic kidney disease ([place]) 07/04/2019   Hypnic headache 05/28/2019   Telogen effluvium 04/17/2019   Xerosis cutis 04/17/2019   Essential hypertension 01/14/2019   Cervical radiculopathy 05/04/2018   Osteoarthritis of left glenohumeral joint 05/04/2018   Vitamin B12 deficiency 01/01/2018   Vascular abnormality 09/11/2017   Dyspepsia 03/28/2017   Pulmonary embolism ([place]) 08/16/2016   Dilated pore of [place] 03/23/2015   Retinal drusen of both eyes 08/28/2014   Status post right cataract extraction 07/30/2014   Verruca 03/19/2014   Chronic back pain 11/06/2013   Postmenopausal osteoporosis 06/11/2013   Anticoagulated on Coumadin 04/06/2013   Long term (current) use of anticoagulants 10/18/2012   Preglaucoma 05/10/2012   Presbyopia 05/10/2012   Senile nuclear sclerosis 05/10/2012   Lobular carcinoma in situ of breast 04/13/2012   Closed fracture of ankle 02/27/2012   History of nonmelanoma skin cancer 09/05/2011   Other benign neoplasm of skin of trunk 09/05/2011   Osteoporosis 08/25/2011   Transient alteration of awareness 09/03/2009   Colon adenoma  07/30/2002    REFERRING DIAG:  R42 (ICD-10-CM) - Dizziness and giddiness  R26.89 (ICD-10-CM) - Other abnormalities of gait and mobility    THERAPY DIAG:  Difficulty in walking, not elsewhere classified  Other abnormalities of gait and mobility  Unsteadiness on feet  Muscle weakness (generalized)  Rationale for Evaluation and Treatment Rehabilitation  PERTINENT HISTORY: Pt underwent "2 major surgies" last April and reports a slow deterioration in her balance and strength since that time. She is fearful of  falling and if she stands for an extended period of time her legs feel weak.  Pt goes to the [facility] for exercise twice/week. No falls in the last 6-12 months. She uses a single point cane in her RUE to help with her balance during ambulation. Patient lives in a single story townhome (one step to enter) independently. She has a walk-in shower with a seat and grab bars. She has chronic L low back pain and uses topical patches to manage. She takes Warfarin so she can only use Tylenol to help with her pain. She was recently started on low-dose Norvasc due to hypertension which has been effective at lowering her blood pressure readings.   PRECAUTIONS: Fall  SUBJECTIVE: Pt reports she has been busy with appoinyments and managing her   PAIN:  Are you having pain? Yes: NPRS scale: 4/10 Pain location: Back     TODAY'S TREATMENT:  05/19/22  Exercise/Activity Sets/ Reps/Time/ Resistance Assistance Charge type Comments Unless otherwise stated, CGA was provided and gait belt donned in order to ensure pt safety    Ambulation with AW 2*150 ft w/ 3# and SPC SPC / cga  Therex  Pt reports activity as tiredness generally.   Standing march   2*10 reps each LE 3 lb   Therex   HS curl standing with 3# AW  2*10 ea side with 3 # AW   therex Cues for upright posture   STS  STS seated on airex pad  *10 with UE support  *9 with airex pad  CGA Therex No UE support required         LAQ  2 x 10  reps x 3 #   Therex  Hands under thigh for support of hip flexors improved knee extension ROM.   Rocker board stance 3 x 45 sec  Intermittent UE, CGA  NMR Improved stability with intermittent UE  Cues for proper positioning   Airex adducted stance   2*30 seconds with small horizontal head turns  Intermittent UE, CGA  NMR  Good stability noted, fatigue noted toward last repetition                Treatment provided this session   Pt educated throughout session about proper posture and technique with exercises.  Improved exercise technique, movement at target joints, use of target muscles after min to mod verbal, visual, tactile cues.   Rationale for Evaluation and Treatment Rehabilitation        PATIENT EDUCATION: Education details: Exercise technique Person educated: Patient Education method: Customer service manager Education comprehension: verbalized understanding and returned demonstration   HOME EXERCISE PROGRAM: No changes this date    PT Short Term Goals - 03/10/22 1152       PT SHORT TERM GOAL #1   Title Pt will be independent with HEP in order to improve strength and balance in order to decrease fall risk and improve function at home.    Baseline Pt is completing HEP independently a tthis  time.    Time 4    Period Weeks    Status New    Target Date 03/02/22      PT SHORT TERM GOAL #2   Title Pt will improve BERG by at least 3 points in order to demonstrate clinically significant improvement in balance.    Baseline 02/02/22: 37/56 03/10/22: 40/56    Time 4    Period Weeks    Status Achieved    Target Date 03/02/22              PT Long Term Goals - 04/27/22 1312       PT LONG TERM GOAL #1   Title Pt will improve BERG to at least 43/56 in order to demonstrate clinically significant improvement in balance and decreased risk for falls    Baseline 02/02/22: 37/56 4/13: 43    Time 8    Period Weeks    Status Achieved    Target Date 03/30/22      PT LONG TERM GOAL #2   Title Pt will increase self-selected 10MWT by at least 0.13 m/s in order to demonstrate clinically significant improvement in community ambulation.    Baseline 02/02/22: self-selected: 14.7s = 0.68 m/s 4/13 . 73 m/s 5/3: .97 m/s 5/31:.95 m/s    Time 8    Period Weeks    Status Achieved    Target Date 05/25/22      PT LONG TERM GOAL #3   Title Pt will be able to perform sit to stand x 5 from regular height chair without UE use in order to improvement independence with transfers and decrease her  risk for falls    Baseline 02/02/22: unable to perform 1 repetition, 4/13: 22.8 with UE on surface of chair 13.82 sec, 24.44 sec no UE 5/31: 25.1 s UE on knees    Time 8    Period Weeks    Status Achieved    Target Date 03/30/22      PT LONG TERM GOAL #4   Title Pt will decrease TUG to below 12 seconds in order to demonstrate decreased fall risk and improved functional mobility    Baseline 02/02/22: 14.8s 4/13: 18.48 sec 14.2 sec 5/31:13.67 sec    Time 8    Period Weeks    Status New    Target Date 05/25/22              Plan - 05/19/22 1150     Clinical Impression Statement Continued with current plan of care as laid out in evaluation and recent prior sessions. Pt remains motivated to advance progress toward goals in order to maximize independence and safety at home. Pt requires high level assistance and cuing for completion of exercises in order to provide adequate level of stimulation and perturbation. Pt progresses with ambulation endurance this session with good response overall. Pt continues to demonstrate progress toward goals AEB progression of some interventions this date either in volume or intensity.     Personal Factors and Comorbidities Age;Comorbidity 3+    Comorbidities anemia, HTN, aortic stenosis, chronic back pain    Examination-Activity Limitations Bathing;Bend;Squat;Stairs;Stand;Transfers    Examination-Participation Restrictions Community Activity;Shop;Meal Prep    Stability/Clinical Decision Making Evolving/Moderate complexity    Rehab Potential Good    PT Frequency 1x / week    PT Duration 8 weeks    PT Treatment/Interventions ADLs/Self Care Home Management;Aquatic Therapy;Canalith Repostioning;Cryotherapy;Electrical Stimulation;Moist Heat;Iontophoresis '4mg'$ /ml Dexamethasone;Traction;Ultrasound;Gait training;Stair training;Functional mobility training;Therapeutic activities;Therapeutic exercise;Balance training;Neuromuscular re-education;Cognitive  remediation;Patient/family education;Manual techniques;Passive  range of motion;Dry needling;Vestibular;Spinal Manipulations;Joint Manipulations    PT Next Visit Plan Continue with balance and core/LE strengthening, evaluate activities for safety with [employer] exercise class per pt report.    PT Home Exercise Plan Completing on days not in therapy    Consulted and Agree with Plan of Care Patient               Particia Lather, PT 05/19/2022, 11:50 AM

## 2022-05-24 ENCOUNTER — Ambulatory Visit: Payer: Medicare Other | Admitting: Physical Therapy

## 2022-05-26 ENCOUNTER — Ambulatory Visit: Payer: Medicare Other | Admitting: Physical Therapy

## 2022-05-26 ENCOUNTER — Encounter: Payer: Self-pay | Admitting: Physical Therapy

## 2022-05-26 ENCOUNTER — Inpatient Hospital Stay: Payer: Medicare Other

## 2022-05-26 VITALS — BP 154/83 | HR 68 | Temp 96.9°F | Resp 18

## 2022-05-26 DIAGNOSIS — R262 Difficulty in walking, not elsewhere classified: Secondary | ICD-10-CM

## 2022-05-26 DIAGNOSIS — M6281 Muscle weakness (generalized): Secondary | ICD-10-CM

## 2022-05-26 DIAGNOSIS — D508 Other iron deficiency anemias: Secondary | ICD-10-CM

## 2022-05-26 DIAGNOSIS — D509 Iron deficiency anemia, unspecified: Secondary | ICD-10-CM | POA: Diagnosis not present

## 2022-05-26 DIAGNOSIS — R2681 Unsteadiness on feet: Secondary | ICD-10-CM

## 2022-05-26 DIAGNOSIS — R2689 Other abnormalities of gait and mobility: Secondary | ICD-10-CM

## 2022-05-26 MED ORDER — SODIUM CHLORIDE 0.9 % IV SOLN
510.0000 mg | INTRAVENOUS | Status: DC
  Administered 2022-05-26: 510 mg via INTRAVENOUS
  Filled 2022-05-26: qty 510

## 2022-05-26 MED ORDER — SODIUM CHLORIDE 0.9 % IV SOLN
Freq: Once | INTRAVENOUS | Status: AC
  Filled 2022-05-26: qty 250

## 2022-05-26 NOTE — Therapy (Signed)
OUTPATIENT PHYSICAL THERAPY TREATMENT NOTE/ Discharge Summary /RECERT   Patient Name: Kristina Rowe MRN: [id number] DOB:[birth year], 88 y.o., female Today's Date: 05/26/2022  PCP: Gladstone Lighter, MD REFERRING PROVIDER: Gladstone Lighter, MD   PT End of Session - 05/26/22 1151     Visit Number 23    Number of Visits 25    Date for PT Re-Evaluation 05/25/22    Authorization Type eval: 02/02/22    Progress Note Due on Visit 30    PT Start Time 1148    PT Stop Time 1230    PT Time Calculation (min) 42 min    Equipment Utilized During Treatment Gait belt    Activity Tolerance Patient tolerated treatment well    Behavior During Therapy WFL for tasks assessed/performed             Past Medical History:  Diagnosis Date   Ductal carcinoma in situ (DCIS) of left breast    Dysphagia    GERD (gastroesophageal reflux disease)    Hiatal hernia    Hypertension    Iron deficiency anemia    Osteoporosis    Pulmonary embolism ([place])    Scoliosis    UTI (urinary tract infection)    Past Surgical History:  Procedure Laterality Date   BREAST LUMPECTOMY Left 2009   Ductal Carcinoma insitu    CATARACT EXTRACTION, BILATERAL     COLONOSCOPY N/A 03/17/2021   Procedure: COLONOSCOPY;  Surgeon: Lesly Rubenstein, MD;  Location: [facility];  Service: Endoscopy;  Laterality: N/A;   COLOSTOMY REVISION Right 03/18/2021   Procedure: COLON RESECTION RIGHT;  Surgeon: Olean Ree, MD;  Location: [facility] OR[facility];  Service: General;  Laterality: Right;   LAPAROSCOPIC PARAESOPHAGEAL HERNIA REPAIR  2009   LAPAROTOMY N/A 03/10/2021   Procedure: EXPLORATORY LAPAROTOMY;  Surgeon: Olean Ree, MD;  Location: [facility] OR[facility];  Service: General;  Laterality: N/A;   TUBAL LIGATION     Patient Active Problem List   Diagnosis Date Noted   Basal cell carcinoma of leg 04/01/2021   Coronary atherosclerosis 04/01/2021   History of pulmonary embolism 04/01/2021   Pulmonary nodule 04/01/2021   Malignant neoplasm  of colon ([place]) 03/29/2021   Rectal bleeding 03/15/2021   Hypertension    GERD (gastroesophageal reflux disease)    Colonic mass    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[place]) 03/10/2021   Anemia, unspecified 03/06/2021   Bronchiectasis without complication ([place]) 77/93/9030   Iron deficiency anemia 03/27/2020   Moderate aortic stenosis 03/27/2020   Stage 3a chronic kidney disease ([place]) 07/04/2019   Hypnic headache 05/28/2019   Telogen effluvium 04/17/2019   Xerosis cutis 04/17/2019   Essential hypertension 01/14/2019   Cervical radiculopathy 05/04/2018   Osteoarthritis of left glenohumeral joint 05/04/2018   Vitamin B12 deficiency 01/01/2018   Vascular abnormality 09/11/2017   Dyspepsia 03/28/2017   Pulmonary embolism ([place]) 08/16/2016   Dilated pore of [place] 03/23/2015   Retinal drusen of both eyes 08/28/2014   Status post right cataract extraction 07/30/2014   Verruca 03/19/2014   Chronic back pain 11/06/2013   Postmenopausal osteoporosis 06/11/2013   Anticoagulated on Coumadin 04/06/2013   Long term (current) use of anticoagulants 10/18/2012   Preglaucoma 05/10/2012   Presbyopia 05/10/2012   Senile nuclear sclerosis 05/10/2012   Lobular carcinoma in situ of breast 04/13/2012   Closed fracture of ankle 02/27/2012   History of nonmelanoma skin cancer 09/05/2011   Other benign neoplasm of skin of trunk 09/05/2011   Osteoporosis 08/25/2011   Transient alteration of awareness 09/03/2009  Colon adenoma 07/30/2002    REFERRING DIAG:  R42 (ICD-10-CM) - Dizziness and giddiness  R26.89 (ICD-10-CM) - Other abnormalities of gait and mobility    THERAPY DIAG:  Difficulty in walking, not elsewhere classified  Other abnormalities of gait and mobility  Unsteadiness on feet  Muscle weakness (generalized)  Rationale for Evaluation and Treatment Rehabilitation  PERTINENT HISTORY: Pt underwent "2 major surgies" last April and reports a slow deterioration in her balance and strength since that  time. She is fearful of falling and if she stands for an extended period of time her legs feel weak.  Pt goes to the [facility] for exercise twice/week. No falls in the last 6-12 months. She uses a single point cane in her RUE to help with her balance during ambulation. Patient lives in a single story townhome (one step to enter) independently. She has a walk-in shower with a seat and grab bars. She has chronic L low back pain and uses topical patches to manage. She takes Warfarin so she can only use Tylenol to help with her pain. She was recently started on low-dose Norvasc due to hypertension which has been effective at lowering her blood pressure readings.   PRECAUTIONS: Fall  SUBJECTIVE: Pt reports she has been busy with appoinyments and managing her   PAIN:  Are you having pain? Yes: NPRS scale: 4/10 Pain location: Back     TODAY'S TREATMENT:  05/26/22 Physical therapy treatment session today consisted of completing assessment of goals and administration of testing as demonstrated in goals section of this exam. Addition treatments may be found below.    Exercise/Activity Sets/ Reps/Time/ Resistance Assistance Charge type Comments Unless otherwise stated, CGA was provided and gait belt donned in order to ensure pt safety    Ambulation SPC with cone weaving activity  X 2 rounds with 7 cones  SPC / cga  Therex  Reports she would not feel comfortable completing without cane but no LOB noted   Standing march   2*10 reps each LE 3 lb   Therex   HS curl standing with 3# AW  2*10 ea side with 3 # AW   therex Cues for upright posture    STS seated on airex pad   *10 with airex pad  CGA Therex No UE support required         LAQ  2 x 10  reps x 3 #   Therex  Hands under thigh for support of hip flexors improved knee extension ROM.         Airex adducted stance  2*30 seconds with small horizontal head turns  Intermittent UE, CGA  NMR  Good stability noted, fatigue noted toward last  repetition                Treatment provided this session   Pt educated throughout session about proper posture and technique with exercises. Improved exercise technique, movement at target joints, use of target muscles after min to mod verbal, visual, tactile cues.   Rationale for Evaluation and Treatment Rehabilitation        PATIENT EDUCATION: Education details: Exercise technique Person educated: Patient Education method: Customer service manager Education comprehension: verbalized understanding and returned demonstration   HOME EXERCISE PROGRAM: No changes this date    PT Short Term Goals -       PT SHORT TERM GOAL #1   Title Pt will be independent with HEP in order to improve strength and balance in order to decrease  fall risk and improve function at home.    Baseline Pt is completing HEP independently a tthis time.    Time 4    Period Weeks    Status New    Target Date 03/02/22      PT SHORT TERM GOAL #2   Title Pt will improve BERG by at least 3 points in order to demonstrate clinically significant improvement in balance.    Baseline 02/02/22: 37/56 03/10/22: 40/56    Time 4    Period Weeks    Status Achieved    Target Date 03/02/22              PT Long Term Goals -       PT LONG TERM GOAL #1   Title Pt will improve BERG to at least 43/56 in order to demonstrate clinically significant improvement in balance and decreased risk for falls    Baseline 02/02/22: 37/56 4/13: 43    Time 8    Period Weeks    Status Achieved    Target Date 03/30/22      PT LONG TERM GOAL #2   Title Pt will increase self-selected 10MWT by at least 0.13 m/s in order to demonstrate clinically significant improvement in community ambulation.    Baseline 02/02/22: self-selected: 14.7s = 0.68 m/s 4/13 . 73 m/s 5/3: .97 m/s 5/31:.95 m/s    Time 8    Period Weeks    Status Achieved    Target Date 05/25/22      PT LONG TERM GOAL #3   Title Pt will be able to perform sit to  stand x 5 from regular height chair without UE use in order to improvement independence with transfers and decrease her risk for falls    Baseline 02/02/22: unable to perform 1 repetition, 4/13: 22.8 with UE on surface of chair 13.82 sec, 24.44 sec no UE 5/31: 25.1 s UE on knees    Time 8    Period Weeks    Status Achieved    Target Date 03/30/22      PT LONG TERM GOAL #4   Title Pt will decrease TUG to below 12 seconds in order to demonstrate decreased fall risk and improved functional mobility    Baseline 02/02/22: 14.8s 4/13: 18.48 sec 14.2 sec 5/31:13.67 sec    Time 8    Period Weeks    Status Not Met     Target Date 05/25/22              Plan - 05/19/22 1150     Clinical Impression Statement Patient visit physical therapy today for discharge summary.  Patient has met or nearly met all of her physical therapy goals and is significantly reduced her risk for falls through physical therapy interventions both in the clinic as well as with her home exercise program she is completing regularly.  Patient feels she is able to better navigate her environment with increased confidence and with decreased risk of falls.  Patient is comfortable with her current level of function is happy with the progress she is made.  Patient will be recertified through the state as her current certification ended 2 days prior but will be discharged from skilled physical therapy at this time and instructed to continue pleat her home exercise program as prescribed.  All questions answered end of session.   Personal Factors and Comorbidities Age;Comorbidity 3+    Comorbidities anemia, HTN, aortic stenosis, chronic back pain    Examination-Activity Limitations  Bathing;Bend;Squat;Stairs;Stand;Transfers    Examination-Participation Restrictions Community Activity;Shop;Meal Prep    Stability/Clinical Decision Making Evolving/Moderate complexity    Rehab Potential Good    PT Frequency 1x / week    PT Duration 8 weeks     PT Treatment/Interventions ADLs/Self Care Home Management;Aquatic Therapy;Canalith Repostioning;Cryotherapy;Electrical Stimulation;Moist Heat;Iontophoresis 97m/ml Dexamethasone;Traction;Ultrasound;Gait training;Stair training;Functional mobility training;Therapeutic activities;Therapeutic exercise;Balance training;Neuromuscular re-education;Cognitive remediation;Patient/family education;Manual techniques;Passive range of motion;Dry needling;Vestibular;Spinal Manipulations;Joint Manipulations    PT Next Visit Plan Continue with balance and core/LE strengthening, evaluate activities for safety with [employer] exercise class per pt report.    PT Home Exercise Plan    Consulted and Agree with Plan of Care Patient               CParticia Lather PT 05/26/2022, 1:07 PM

## 2022-05-26 NOTE — Patient Instructions (Signed)
[facility]  Discharge Instructions: Thank you for choosing [facility] to provide your oncology and hematology care.  If you have a lab appointment with the Cancer Center, please go directly to the Cancer Center and check in at the registration area.  Wear comfortable clothing and clothing appropriate for easy access to any Portacath or PICC line.   We strive to give you quality time with your provider. You may need to reschedule your appointment if you arrive late (15 or more minutes).  Arriving late affects you and other patients whose appointments are after yours.  Also, if you miss three or more appointments without notifying the office, you may be dismissed from the clinic at the provider's discretion.      For prescription refill requests, have your pharmacy contact our office and allow 72 hours for refills to be completed.    Today you received the following chemotherapy and/or immunotherapy agents FERAHEME      To help prevent nausea and vomiting after your treatment, we encourage you to take your nausea medication as directed.  BELOW ARE SYMPTOMS THAT SHOULD BE REPORTED IMMEDIATELY: *FEVER GREATER THAN 100.4 F (38 C) OR HIGHER *CHILLS OR SWEATING *NAUSEA AND VOMITING THAT IS NOT CONTROLLED WITH YOUR NAUSEA MEDICATION *UNUSUAL SHORTNESS OF BREATH *UNUSUAL BRUISING OR BLEEDING *URINARY PROBLEMS (pain or burning when urinating, or frequent urination) *BOWEL PROBLEMS (unusual diarrhea, constipation, pain near the anus) TENDERNESS IN MOUTH AND THROAT WITH OR WITHOUT PRESENCE OF ULCERS (sore throat, sores in mouth, or a toothache) UNUSUAL RASH, SWELLING OR PAIN  UNUSUAL VAGINAL DISCHARGE OR ITCHING   Items with * indicate a potential emergency and should be followed up as soon as possible or go to the Emergency Department if any problems should occur.  Please show the CHEMOTHERAPY ALERT CARD or IMMUNOTHERAPY ALERT CARD at check-in to  the Emergency Department and triage nurse.  Should you have questions after your visit or need to cancel or reschedule your appointment, please contact [facility]  [phone] and follow the prompts.  Office hours are 8:00 a.m. to 4:30 p.m. Monday - Friday. Please note that voicemails left after 4:00 p.m. may not be returned until the following business day.  We are closed weekends and major holidays. You have access to a nurse at all times for urgent questions. Please call the main number to the clinic [phone] and follow the prompts.  For any non-urgent questions, you may also contact your provider using MyChart. We now offer e-Visits for anyone 18 and older to request care online for non-urgent symptoms. For details visit [email or web address].   Also download the MyChart app! Go to the app store, search "MyChart", open the app, select [facility], and log in with your MyChart username and password.  Masks are optional in the cancer centers. If you would like for your care team to wear a mask while they are taking care of you, please let them know. For doctor visits, patients may have with them one support person who is at least 86 years old. At this time, visitors are not allowed in the infusion area.  Ferumoxytol Injection What is this medication? FERUMOXYTOL (FER ue MOX i tol) treats low levels of iron in your body (iron deficiency anemia). Iron is a mineral that plays an important role in making red blood cells, which carry oxygen from your lungs to the rest of your body. This medicine may be used for other   purposes; ask your health care provider or pharmacist if you have questions. COMMON BRAND NAME(S): Feraheme What should I tell my care team before I take this medication? They need to know if you have any of these conditions: Anemia not caused by low iron levels High levels of iron in the blood Magnetic resonance imaging (MRI) test scheduled An  unusual or allergic reaction to iron, other medications, foods, dyes, or preservatives Pregnant or trying to get pregnant Breast-feeding How should I use this medication? This medication is for injection into a vein. It is given in a hospital or clinic setting. Talk to your care team the use of this medication in children. Special care may be needed. Overdosage: If you think you have taken too much of this medicine contact a poison control center or emergency room at once. NOTE: This medicine is only for you. Do not share this medicine with others. What if I miss a dose? It is important not to miss your dose. Call your care team if you are unable to keep an appointment. What may interact with this medication? Other iron products This list may not describe all possible interactions. Give your health care provider a list of all the medicines, herbs, non-prescription drugs, or dietary supplements you use. Also tell them if you smoke, drink alcohol, or use illegal drugs. Some items may interact with your medicine. What should I watch for while using this medication? Visit your care team regularly. Tell your care team if your symptoms do not start to get better or if they get worse. You may need blood work done while you are taking this medication. You may need to follow a special diet. Talk to your care team. Foods that contain iron include: whole grains/cereals, dried fruits, beans, or peas, leafy green vegetables, and organ meats (liver, kidney). What side effects may I notice from receiving this medication? Side effects that you should report to your care team as soon as possible: Allergic reactions--skin rash, itching, hives, swelling of the face, lips, tongue, or throat Low blood pressure--dizziness, feeling faint or lightheaded, blurry vision Shortness of breath Side effects that usually do not require medical attention (report to your care team if they continue or are  bothersome): Flushing Headache Joint pain Muscle pain Nausea Pain, redness, or irritation at injection site This list may not describe all possible side effects. Call your doctor for medical advice about side effects. You may report side effects to FDA at 1-800-FDA-1088. Where should I keep my medication? This medication is given in a hospital or clinic and will not be stored at home. NOTE: This sheet is a summary. It may not cover all possible information. If you have questions about this medicine, talk to your doctor, pharmacist, or health care provider.  2023 Elsevier/Gold Standard (2021-04-09 00:00:00)    

## 2022-05-27 ENCOUNTER — Encounter: Payer: Self-pay | Admitting: Physician Assistant

## 2022-05-27 ENCOUNTER — Ambulatory Visit (INDEPENDENT_AMBULATORY_CARE_PROVIDER_SITE_OTHER): Payer: Medicare Other | Admitting: Physician Assistant

## 2022-05-27 VITALS — BP 159/76 | HR 81 | Ht 60.0 in | Wt 95.0 lb

## 2022-05-27 DIAGNOSIS — Z87440 Personal history of urinary (tract) infections: Secondary | ICD-10-CM

## 2022-05-27 DIAGNOSIS — R3915 Urgency of urination: Secondary | ICD-10-CM

## 2022-05-27 DIAGNOSIS — R300 Dysuria: Secondary | ICD-10-CM

## 2022-05-27 DIAGNOSIS — R350 Frequency of micturition: Secondary | ICD-10-CM

## 2022-05-27 DIAGNOSIS — N390 Urinary tract infection, site not specified: Secondary | ICD-10-CM

## 2022-05-27 LAB — MICROSCOPIC EXAMINATION: Bacteria, UA: NONE SEEN

## 2022-05-27 LAB — URINALYSIS, COMPLETE
Bilirubin, UA: NEGATIVE
Glucose, UA: NEGATIVE
Ketones, UA: NEGATIVE
Leukocytes,UA: NEGATIVE
Nitrite, UA: NEGATIVE
Protein,UA: NEGATIVE
Specific Gravity, UA: 1.01 (ref 1.005–1.030)
Urobilinogen, Ur: 0.2 mg/dL (ref 0.2–1.0)
pH, UA: 5.5 (ref 5.0–7.5)

## 2022-05-27 NOTE — Progress Notes (Unsigned)
05/27/2022 12:24 PM   Kristina Rowe 08-29-1930 [id number]  CC: Chief Complaint  Patient presents with   Follow-up   Urinary Tract Infection   HPI: Kristina Rowe is a 88 y.o. female with PMH OAB wet previously on tolterodine who discontinued Myrbetriq due to cost and recurrent UTI on topical vaginal estrogen cream and suppressive trimethoprim with no significant findings on cystoscopy or CT in 2022 who presents today for evaluation of possible UTI.   Today she reports a 2-day history of dysuria especially with initiation, urgency, and frequency.  She has also had some left-sided back pain which is consistent with her known history of scoliosis and a.m. nausea and dry heaves, which is not unusual for her.  She underwent CT chest abdomen and pelvis without contrast 11 days ago with no evidence of urolithiasis.  In-office UA today positive for trace intact blood; urine microscopy pan negative.  PMH: Past Medical History:  Diagnosis Date   Ductal carcinoma in situ (DCIS) of left breast    Dysphagia    GERD (gastroesophageal reflux disease)    Hiatal hernia    Hypertension    Iron deficiency anemia    Osteoporosis    Pulmonary embolism (HCC)    Scoliosis    UTI (urinary tract infection)     Surgical History: Past Surgical History:  Procedure Laterality Date   BREAST LUMPECTOMY Left 2009   Ductal Carcinoma insitu    CATARACT EXTRACTION, BILATERAL     COLONOSCOPY N/A 03/17/2021   Procedure: COLONOSCOPY;  Surgeon: Lesly Rubenstein, MD;  Location: [facility] ENDOSCOPY;  Service: Endoscopy;  Laterality: N/A;   COLOSTOMY REVISION Right 03/18/2021   Procedure: COLON RESECTION RIGHT;  Surgeon: Olean Ree, MD;  Location: [facility] ORS;  Service: General;  Laterality: Right;   LAPAROSCOPIC PARAESOPHAGEAL HERNIA REPAIR  2009   LAPAROTOMY N/A 03/10/2021   Procedure: EXPLORATORY LAPAROTOMY;  Surgeon: Olean Ree, MD;  Location: [facility] ORS;  Service: General;  Laterality: N/A;   TUBAL  LIGATION      Home Medications:  Allergies as of 05/27/2022       Reactions   Metronidazole Other (See Comments)   Other reaction(s): Other Mouth sores Mouth sores Mouth sores   Omeprazole Other (See Comments)   Other reaction(s): Arthralgias (intolerance) Other reaction(s): Other   Bactrim [sulfamethoxazole-trimethoprim] Nausea Only   Codeine Nausea And Vomiting, Nausea Only   Other reaction(s): Nausea Only But tolerates tramadol        Medication List        Accurate as of May 27, 2022 12:24 PM. If you have any questions, ask your nurse or doctor.          STOP taking these medications    ascorbic acid 250 MG tablet Commonly known as: VITAMIN C Stopped by: Debroah Loop, PA-C   calcium carbonate 1250 (500 Ca) MG chewable tablet Commonly known as: OS-CAL Stopped by: Debroah Loop, PA-C   cyanocobalamin 1000 MCG tablet Stopped by: Debroah Loop, PA-C   famotidine 40 MG tablet Commonly known as: PEPCID Stopped by: Debroah Loop, PA-C   fosfomycin 3 g Pack Commonly known as: MONUROL Stopped by: Debroah Loop, PA-C   ondansetron 4 MG disintegrating tablet Commonly known as: Zofran ODT Stopped by: Debroah Loop, PA-C   pantoprazole 40 MG tablet Commonly known as: PROTONIX Stopped by: Debroah Loop, PA-C       TAKE these medications    acetaminophen 500 MG tablet Commonly known as: TYLENOL Take 500 mg by mouth  at bedtime as needed for mild pain, fever or headache.   amLODipine 2.5 MG tablet Commonly known as: NORVASC Take 2.5 mg by mouth daily.   Cholecalciferol 50 MCG (2000 UT) Caps Take by mouth.   diclofenac Sodium 1 % Gel Commonly known as: VOLTAREN Apply topically 4 (four) times daily.   econazole nitrate 1 % cream Apply 1 application topically daily.   estradiol 0.1 MG/GM vaginal cream Commonly known as: ESTRACE Estrogen Cream Instruction Discard applicator Apply pea sized  amount to tip of finger to urethra before bed. Wash hands well after application. Use Monday, Wednesday and Friday   folic acid 1 MG tablet Commonly known as: FOLVITE Take 1 mg by mouth daily.   IRON PO Take by mouth. Iron bisglycinate   loperamide 2 MG capsule Commonly known as: IMODIUM Take 2 mg by mouth as needed for diarrhea or loose stools.   mirtazapine 15 MG tablet Commonly known as: REMERON Take 15 mg by mouth at bedtime. What changed: Another medication with the same name was removed. Continue taking this medication, and follow the directions you see here. Changed by: Debroah Loop, PA-C   ondansetron 4 MG tablet Commonly known as: ZOFRAN Take 4 mg by mouth every 8 (eight) hours as needed.   promethazine 25 MG tablet Commonly known as: PHENERGAN Take 25 mg by mouth every 6 (six) hours as needed for nausea or vomiting.   trimethoprim 100 MG tablet Commonly known as: TRIMPEX Take 1 tablet (100 mg total) by mouth daily.   warfarin 2.5 MG tablet Commonly known as: Coumadin On Sundays and Thursdays resume the 2.5 mg dose.  All other days take 5 mg.   warfarin 5 MG tablet Commonly known as: COUMADIN Take by mouth.        Allergies:  Allergies  Allergen Reactions   Metronidazole Other (See Comments)    Other reaction(s): Other Mouth sores Mouth sores Mouth sores    Omeprazole Other (See Comments)    Other reaction(s): Arthralgias (intolerance) Other reaction(s): Other    Bactrim [Sulfamethoxazole-Trimethoprim] Nausea Only   Codeine Nausea And Vomiting and Nausea Only    Other reaction(s): Nausea Only But tolerates tramadol     Family History: Family History  Problem Relation Age of Onset   Breast cancer Neg Hx     Social History:   reports that she has never smoked. She has never been exposed to tobacco smoke. She has never used smokeless tobacco. She reports that she does not drink alcohol and does not use drugs.  Physical Exam: BP  (!) 159/76   Pulse 81   Ht 5' (1.524 m)   Wt 95 lb (43.1 kg)   BMI 18.55 kg/m   Constitutional:  Alert and oriented, no acute distress, nontoxic appearing HEENT: [place], AT Cardiovascular: No clubbing, cyanosis, or edema Respiratory: Normal respiratory effort, no increased work of breathing Skin: No rashes, bruises or suspicious lesions Neurologic: Grossly intact, no focal deficits, moving all 4 extremities Psychiatric: Normal mood and affect  Laboratory Data: Results for orders placed or performed in visit on 05/27/22  CULTURE, URINE COMPREHENSIVE   Specimen: Urine   UR  Result Value Ref Range   Urine Culture, Comprehensive Final report    Organism ID, Bacteria Comment    Organism ID, Bacteria Not applicable   Microscopic Examination   Urine  Result Value Ref Range   WBC, UA 0-5 0 - 5 /hpf   RBC, Urine 0-2 0 - 2 /hpf   Epithelial  Cells (non renal) 0-10 0 - 10 /hpf   Bacteria, UA None seen None seen/Few  Urinalysis, Complete  Result Value Ref Range   Specific Gravity, UA 1.010 1.005 - 1.030   pH, UA 5.5 5.0 - 7.5   Color, UA Yellow Yellow   Appearance Ur Clear Clear   Leukocytes,UA Negative Negative   Protein,UA Negative Negative/Trace   Glucose, UA Negative Negative   Ketones, UA Negative Negative   RBC, UA Trace (A) Negative   Bilirubin, UA Negative Negative   Urobilinogen, Ur 0.2 0.2 - 1.0 mg/dL   Nitrite, UA Negative Negative   Microscopic Examination See below:    Assessment & Plan:   1. Recurrent UTI UA is bland today, low suspicion for breakthrough UTI on suppressive trimethoprim.  Will send for culture to confirm.  Symptoms may represent acute worsening and OAB, may consider reinitiation of OAB therapies per patient preference. - Urinalysis, Complete - CULTURE, URINE COMPREHENSIVE  Return for Will call with results.  Debroah Loop, PA-C  [facility] [address] [place], [place] [address] [phone]

## 2022-05-30 ENCOUNTER — Telehealth: Payer: Self-pay | Admitting: *Deleted

## 2022-05-30 DIAGNOSIS — N390 Urinary tract infection, site not specified: Secondary | ICD-10-CM

## 2022-05-30 LAB — CULTURE, URINE COMPREHENSIVE

## 2022-05-30 MED ORDER — CEPHALEXIN 250 MG PO CAPS: 250.0000 mg | ORAL_CAPSULE | Freq: Every day | ORAL | 5 refills | Status: DC

## 2022-05-30 NOTE — Telephone Encounter (Signed)
Let's switch to Keflex '250mg'$  daily x6 months. Rx sent to pharmacy; please notify patient. She may stop trimethoprim.

## 2022-05-30 NOTE — Telephone Encounter (Signed)
Spoke with patient and advised results   

## 2022-05-30 NOTE — Telephone Encounter (Signed)
Spoke with patient and advised that urine culture was negative. Pt states the trimpex is making her anxious, SOB, Dizzy and no appetite.

## 2022-06-02 ENCOUNTER — Ambulatory Visit: Payer: Medicare Other | Admitting: Physical Therapy

## 2022-06-07 ENCOUNTER — Ambulatory Visit: Payer: Medicare Other | Admitting: Physical Therapy

## 2022-06-08 ENCOUNTER — Ambulatory Visit: Payer: Medicare Other

## 2022-06-09 ENCOUNTER — Ambulatory Visit: Payer: Medicare Other | Admitting: Physical Therapy

## 2022-06-10 ENCOUNTER — Inpatient Hospital Stay: Payer: Medicare Other | Attending: Oncology

## 2022-06-10 VITALS — BP 125/71 | HR 79 | Temp 97.0°F

## 2022-06-10 DIAGNOSIS — D509 Iron deficiency anemia, unspecified: Secondary | ICD-10-CM | POA: Diagnosis not present

## 2022-06-10 DIAGNOSIS — D508 Other iron deficiency anemias: Secondary | ICD-10-CM

## 2022-06-10 MED ORDER — SODIUM CHLORIDE 0.9 % IV SOLN
Freq: Once | INTRAVENOUS | Status: AC
  Filled 2022-06-10: qty 250

## 2022-06-10 MED ORDER — SODIUM CHLORIDE 0.9 % IV SOLN
510.0000 mg | INTRAVENOUS | Status: DC
  Administered 2022-06-10: 510 mg via INTRAVENOUS
  Filled 2022-06-10: qty 17

## 2022-06-14 ENCOUNTER — Ambulatory Visit: Payer: Medicare Other | Admitting: Physical Therapy

## 2022-06-16 ENCOUNTER — Ambulatory Visit: Payer: Medicare Other | Admitting: Physical Therapy

## 2022-06-20 ENCOUNTER — Ambulatory Visit: Payer: Medicare Other | Admitting: Physician Assistant

## 2022-06-21 ENCOUNTER — Ambulatory Visit: Payer: Medicare Other | Admitting: Physical Therapy

## 2022-06-23 ENCOUNTER — Ambulatory Visit: Payer: Medicare Other | Admitting: Physical Therapy

## 2022-06-28 ENCOUNTER — Ambulatory Visit: Payer: Medicare Other | Admitting: Physical Therapy

## 2022-06-30 ENCOUNTER — Ambulatory Visit: Payer: Medicare Other | Admitting: Physical Therapy

## 2022-06-30 ENCOUNTER — Telehealth: Payer: Self-pay

## 2022-06-30 MED ORDER — CEPHALEXIN 250 MG PO TABS: ORAL_TABLET | ORAL | 5 refills | Status: DC

## 2022-06-30 NOTE — Telephone Encounter (Signed)
Rx for '250mg'$  tablets sent. She'll have to split them in half--the capsules I previously prescribed should not be opened. Should be around $22 per month with a GoodRx coupon at [employer].

## 2022-06-30 NOTE — Telephone Encounter (Signed)
Pt would like to try the lowest dose of Keflex.

## 2022-06-30 NOTE — Telephone Encounter (Signed)
Spoke with patient and advised results   

## 2022-06-30 NOTE — Telephone Encounter (Signed)
Incoming call from pt on triage line who states that she is unable to tolerate low dose Keflex. She has tried taking the medication with food, milk, and taking at bedtime with no relief in symptoms. She states she is nauseated, bloated, and dizzy. Please advise.

## 2022-06-30 NOTE — Telephone Encounter (Signed)
Options include reducing her dose of Keflex to '125mg'$  daily to see if she tolerates it better, trying an alternative cephalosporin which may cause the same side effects, or discontinuing antibiotic prophylaxis. Unfortunately her urine cultures have shown resistance to Macrobid, so that is not an option, and I do not recommend fosfomycin prophylaxis as I prefer to reserve this for treatment use only. Please let me know how she would like to proceed.

## 2022-07-05 ENCOUNTER — Ambulatory Visit: Payer: Medicare Other | Admitting: Physical Therapy

## 2022-07-07 ENCOUNTER — Ambulatory Visit: Payer: Medicare Other | Admitting: Physical Therapy

## 2022-07-12 ENCOUNTER — Encounter: Payer: Self-pay | Admitting: Emergency Medicine

## 2022-07-12 ENCOUNTER — Emergency Department
Admission: EM | Admit: 2022-07-12 | Discharge: 2022-07-12 | Discharge status: Against Medical Advice | Payer: Medicare Other | Attending: Emergency Medicine | Admitting: Emergency Medicine

## 2022-07-12 ENCOUNTER — Other Ambulatory Visit: Payer: Self-pay

## 2022-07-12 ENCOUNTER — Ambulatory Visit: Payer: Medicare Other | Admitting: Physical Therapy

## 2022-07-12 DIAGNOSIS — Z5321 Procedure and treatment not carried out due to patient leaving prior to being seen by health care provider: Secondary | ICD-10-CM | POA: Diagnosis not present

## 2022-07-12 DIAGNOSIS — I10 Essential (primary) hypertension: Secondary | ICD-10-CM | POA: Diagnosis present

## 2022-07-12 LAB — BASIC METABOLIC PANEL
Anion gap: 9 (ref 5–15)
BUN: 11 mg/dL (ref 8–23)
CO2: 31 mmol/L (ref 22–32)
Calcium: 10.1 mg/dL (ref 8.9–10.3)
Chloride: 94 mmol/L — ABNORMAL LOW (ref 98–111)
Creatinine, Ser: 0.67 mg/dL (ref 0.44–1.00)
GFR, Estimated: >60 mL/min (ref >60)
Glucose, Bld: 106 mg/dL — ABNORMAL HIGH (ref 70–99)
Potassium: 4.8 mmol/L (ref 3.5–5.1)
Sodium: 134 mmol/L — ABNORMAL LOW (ref 135–145)

## 2022-07-12 LAB — CBC
HCT: 41.9 % (ref 36.0–46.0)
Hemoglobin: 13.6 g/dL (ref 12.0–15.0)
MCH: 32.5 pg (ref 26.0–34.0)
MCHC: 32.5 g/dL (ref 30.0–36.0)
MCV: 100 fL (ref 80.0–100.0)
Platelets: 194 10*3/uL (ref 150–400)
RBC: 4.19 MIL/uL (ref 3.87–5.11)
RDW: 13.1 % (ref 11.5–15.5)
WBC: 5 10*3/uL (ref 4.0–10.5)
nRBC: 0 % (ref 0.0–0.2)

## 2022-07-12 MED ORDER — ACETAMINOPHEN 500 MG PO TABS
1000.0000 mg | ORAL_TABLET | Freq: Once | ORAL | Status: AC
  Administered 2022-07-12: 1000 mg via ORAL
  Filled 2022-07-12: qty 2

## 2022-07-12 NOTE — ED Triage Notes (Signed)
Pt reports hypertension while at home today. Pt reports headache. Pt states she started Celexa recently and since starting that medication she has had these symptoms.

## 2022-07-13 ENCOUNTER — Telehealth: Payer: Self-pay | Admitting: *Deleted

## 2022-07-13 NOTE — Telephone Encounter (Signed)
Patient called in this afternoon and states she cant take the Cephalexin. She states its making here sick

## 2022-07-14 NOTE — Addendum Note (Signed)
Addended by: Despina Hidden on: 07/14/2022 01:49 PM   Modules accepted: Orders

## 2022-07-14 NOTE — Telephone Encounter (Signed)
OK to stop Keflex. Options include trying an alternative cephalosporin which may cause the same side effects or discontinuing antibiotic prophylaxis. Unfortunately her urine cultures have shown resistance to Macrobid, so that is not an option, and I do not recommend fosfomycin prophylaxis as I prefer to reserve this for treatment use only. Please let me know how she would like to proceed.

## 2022-07-14 NOTE — Telephone Encounter (Signed)
Spoke with patient and advised results  She will call if she needs [place]

## 2022-07-18 ENCOUNTER — Ambulatory Visit: Payer: Medicare Other | Admitting: Physical Therapy

## 2022-07-25 ENCOUNTER — Ambulatory Visit: Payer: Medicare Other | Admitting: Physical Therapy

## 2022-08-02 ENCOUNTER — Ambulatory Visit: Payer: Medicare Other | Attending: Cardiology

## 2022-08-02 DIAGNOSIS — I350 Nonrheumatic aortic (valve) stenosis: Secondary | ICD-10-CM | POA: Diagnosis present

## 2022-08-02 LAB — ECHOCARDIOGRAM COMPLETE
AR max vel: 1.07 cm2
AV Area VTI: 1.26 cm2
AV Area mean vel: 1.21 cm2
AV Mean grad: 17 mmHg
AV Peak grad: 29.4 mmHg
Ao pk vel: 2.71 m/s
Area-P 1/2: 2.6 cm2
Calc EF: 57.3 %
S' Lateral: 1.9 cm
Single Plane A2C EF: 56.8 %
Single Plane A4C EF: 55 %

## 2022-08-03 ENCOUNTER — Ambulatory Visit: Payer: Medicare Other | Admitting: Physical Therapy

## 2022-08-03 ENCOUNTER — Other Ambulatory Visit: Payer: Self-pay | Admitting: *Deleted

## 2022-08-03 ENCOUNTER — Telehealth: Payer: Self-pay | Admitting: *Deleted

## 2022-08-03 DIAGNOSIS — C182 Malignant neoplasm of ascending colon: Secondary | ICD-10-CM

## 2022-08-03 NOTE — Telephone Encounter (Signed)
Can you please look into this and see her?

## 2022-08-03 NOTE — Telephone Encounter (Signed)
Patient called complaining of progressively worsening nausea, she is taking Zofran . SH eis asking to come in and have labs checked earlier that scheduled (08/18/22). Please advise

## 2022-08-03 NOTE — Telephone Encounter (Signed)
Contacted patient. Pt given apt for 10 am for labs - followed by apt with Merrily Pew, NP- [facility]

## 2022-08-04 ENCOUNTER — Inpatient Hospital Stay (HOSPITAL_BASED_OUTPATIENT_CLINIC_OR_DEPARTMENT_OTHER): Payer: Medicare Other | Admitting: Hospice and Palliative Medicine

## 2022-08-04 ENCOUNTER — Inpatient Hospital Stay: Payer: Medicare Other | Attending: Oncology

## 2022-08-04 ENCOUNTER — Other Ambulatory Visit: Payer: Self-pay

## 2022-08-04 ENCOUNTER — Encounter: Payer: Self-pay | Admitting: Hospice and Palliative Medicine

## 2022-08-04 ENCOUNTER — Inpatient Hospital Stay: Payer: Medicare Other

## 2022-08-04 VITALS — BP 150/85 | HR 84 | Temp 98.0°F | Resp 16 | Ht 60.0 in | Wt 91.1 lb

## 2022-08-04 DIAGNOSIS — C182 Malignant neoplasm of ascending colon: Secondary | ICD-10-CM

## 2022-08-04 DIAGNOSIS — R110 Nausea: Secondary | ICD-10-CM

## 2022-08-04 DIAGNOSIS — D509 Iron deficiency anemia, unspecified: Secondary | ICD-10-CM | POA: Insufficient documentation

## 2022-08-04 DIAGNOSIS — E860 Dehydration: Secondary | ICD-10-CM

## 2022-08-04 DIAGNOSIS — E871 Hypo-osmolality and hyponatremia: Secondary | ICD-10-CM

## 2022-08-04 LAB — CBC WITH DIFFERENTIAL/PLATELET
Abs Immature Granulocytes: 0.02 10*3/uL (ref 0.00–0.07)
Basophils Absolute: 0 10*3/uL (ref 0.0–0.1)
Basophils Relative: 1 %
Eosinophils Absolute: 0 10*3/uL (ref 0.0–0.5)
Eosinophils Relative: 1 %
HCT: 40.9 % (ref 36.0–46.0)
Hemoglobin: 13.2 g/dL (ref 12.0–15.0)
Immature Granulocytes: 1 %
Lymphocytes Relative: 15 %
Lymphs Abs: 0.6 10*3/uL — ABNORMAL LOW (ref 0.7–4.0)
MCH: 32.4 pg (ref 26.0–34.0)
MCHC: 32.3 g/dL (ref 30.0–36.0)
MCV: 100.2 fL — ABNORMAL HIGH (ref 80.0–100.0)
Monocytes Absolute: 0.3 10*3/uL (ref 0.1–1.0)
Monocytes Relative: 8 %
Neutro Abs: 3.1 10*3/uL (ref 1.7–7.7)
Neutrophils Relative %: 74 %
Platelets: 203 10*3/uL (ref 150–400)
RBC: 4.08 MIL/uL (ref 3.87–5.11)
RDW: 13.1 % (ref 11.5–15.5)
WBC: 4.1 10*3/uL (ref 4.0–10.5)
nRBC: 0 % (ref 0.0–0.2)

## 2022-08-04 LAB — COMPREHENSIVE METABOLIC PANEL
ALT: 20 U/L (ref 0–44)
AST: 26 U/L (ref 15–41)
Albumin: 4.7 g/dL (ref 3.5–5.0)
Alkaline Phosphatase: 65 U/L (ref 38–126)
Anion gap: 8 (ref 5–15)
BUN: 13 mg/dL (ref 8–23)
CO2: 33 mmol/L — ABNORMAL HIGH (ref 22–32)
Calcium: 9.9 mg/dL (ref 8.9–10.3)
Chloride: 91 mmol/L — ABNORMAL LOW (ref 98–111)
Creatinine, Ser: 0.76 mg/dL (ref 0.44–1.00)
GFR, Estimated: >60 mL/min (ref >60)
Glucose, Bld: 117 mg/dL — ABNORMAL HIGH (ref 70–99)
Potassium: 4 mmol/L (ref 3.5–5.1)
Sodium: 132 mmol/L — ABNORMAL LOW (ref 135–145)
Total Bilirubin: 0.6 mg/dL (ref 0.3–1.2)
Total Protein: 7.5 g/dL (ref 6.5–8.1)

## 2022-08-04 LAB — IRON AND TIBC
Iron: 138 ug/dL (ref 28–170)
Saturation Ratios: 43 % — ABNORMAL HIGH (ref 10.4–31.8)
TIBC: 318 ug/dL (ref 250–450)
UIBC: 180 ug/dL

## 2022-08-04 LAB — FERRITIN: Ferritin: 312 ng/mL — ABNORMAL HIGH (ref 11–307)

## 2022-08-04 MED ORDER — ONDANSETRON 4 MG PO TBDP: 4.0000 mg | ORAL_TABLET | Freq: Three times a day (TID) | ORAL | 1 refills | Status: AC | PRN

## 2022-08-04 MED ORDER — SODIUM CHLORIDE 0.9 % IV SOLN
INTRAVENOUS | Status: DC
  Filled 2022-08-04 (×2): qty 250

## 2022-08-04 MED ORDER — ONDANSETRON HCL 4 MG/2ML IJ SOLN
4.0000 mg | Freq: Once | INTRAMUSCULAR | Status: AC
  Administered 2022-08-04: 4 mg via INTRAVENOUS
  Filled 2022-08-04: qty 2

## 2022-08-04 NOTE — Progress Notes (Signed)
Symptom Management [place] at [facility] [phone] [phone]  Patient Care Team: Gladstone Lighter, MD as PCP - General (Internal Medicine) Kate Sable, MD as PCP - Cardiology (Cardiology) Sindy Guadeloupe, MD as Consulting Physician (Hematology and Oncology)   NAME OF PATIENT: Kristina Rowe  [id number]  Aug 15, 1930   DATE OF VISIT: 08/04/22  REASON FOR CONSULT: Kristina Rowe is a 88 y.o. female with multiple medical problems including history of stage III colon cancer status post hemicolectomy by Dr. Hampton Abbot on 03/18/2021.  Patient was not felt to be a candidate for adjuvant chemotherapy due to age nor did she want chemotherapy.  She has been treated for iron deficiency anemia with Feraheme.  She was admitted at [facility] in July 2023 with vomiting following an EGD at which time patient received pyloric dilation and Botox injection.  Patient has extensive GI history with history of tortuous esophagus, hiatal hernia, gastroparesis, recurrent esophageal strictures and is followed by GI at [facility] (Dr. Truddie Crumble).    INTERVAL HISTORY: CT of the chest, abdomen, and pelvis on 05/16/2022 showed no evidence of recurrent or metastatic disease.  Patient last saw Dr. Janese Banks on 05/18/2022 at which time patient was doing reasonably well.  Today, patient presents to clinic for evaluation of nausea.  She reports that she has had fairly persistent nausea since June.  Nausea is particularly severe first in the morning and around lunch but improves throughout the day.  She is taking ondansetron and has found that to be effective.  She denies fever or chills.  No diarrhea, black or tarry stools, or rectal bleeding.  She reports the nausea does affect her appetite and she has had some weight loss.  No abdominal pain or distention.  No urinary symptoms. Patient offers no further specific complaints today.   PAST MEDICAL HISTORY: Past Medical History:   Diagnosis Date   Ductal carcinoma in situ (DCIS) of left breast    Dysphagia    GERD (gastroesophageal reflux disease)    Hiatal hernia    Hypertension    Iron deficiency anemia    Osteoporosis    Pulmonary embolism (HCC)    Scoliosis    UTI (urinary tract infection)     PAST SURGICAL HISTORY:  Past Surgical History:  Procedure Laterality Date   BREAST LUMPECTOMY Left 2009   Ductal Carcinoma insitu    CATARACT EXTRACTION, BILATERAL     COLONOSCOPY N/A 03/17/2021   Procedure: COLONOSCOPY;  Surgeon: Lesly Rubenstein, MD;  Location: [facility] ENDOSCOPY;  Service: Endoscopy;  Laterality: N/A;   COLOSTOMY REVISION Right 03/18/2021   Procedure: COLON RESECTION RIGHT;  Surgeon: Olean Ree, MD;  Location: [facility] ORS;  Service: General;  Laterality: Right;   LAPAROSCOPIC PARAESOPHAGEAL HERNIA REPAIR  2009   LAPAROTOMY N/A 03/10/2021   Procedure: EXPLORATORY LAPAROTOMY;  Surgeon: Olean Ree, MD;  Location: [facility] ORS;  Service: General;  Laterality: N/A;   TUBAL LIGATION      HEMATOLOGY/ONCOLOGY HISTORY:  Oncology History  Malignant neoplasm of colon ([place])  03/29/2021 Initial Diagnosis   Malignant neoplasm of colon ([place])   04/02/2021 Cancer Staging   Staging form: Colon and Rectum, AJCC 8th Edition - Pathologic stage from 04/02/2021: Stage IIIB (pT3, pN1c, cM0) - Signed by Sindy Guadeloupe, MD on 04/02/2021 Total positive nodes: 0 Histologic grading system: 4 grade system Histologic grade (G): G3 Residual tumor (R): R0 - None     ALLERGIES:  is allergic to metronidazole, omeprazole, bactrim [sulfamethoxazole-trimethoprim], and  codeine.  MEDICATIONS:  Current Outpatient Medications  Medication Sig Dispense Refill   acetaminophen (TYLENOL) 500 MG tablet Take 500 mg by mouth at bedtime as needed for mild pain, fever or headache.     Cholecalciferol 50 MCG (2000 UT) CAPS Take by mouth.     estradiol (ESTRACE) 0.1 MG/GM vaginal cream Estrogen Cream Instruction Discard applicator Apply pea  sized amount to tip of finger to urethra before bed. Wash hands well after application. Use Monday, Wednesday and Friday 42.5 g 3   famotidine (PEPCID) 40 MG tablet Take 40 mg by mouth 2 (two) times daily.     folic acid (FOLVITE) 1 MG tablet Take 1 mg by mouth daily.     ondansetron (ZOFRAN-ODT) 4 MG disintegrating tablet Take 4 mg by mouth every 8 (eight) hours as needed.     warfarin (COUMADIN) 2.5 MG tablet On "Sundays and Thursdays resume the 2.5 mg dose.  All other days take 5 mg. (Patient taking differently: On Sundays Tuesdays and  Thursdays resume the 2.5 mg dose.  All other days take 5 mg.) 30 tablet 11   warfarin (COUMADIN) 5 MG tablet Take by mouth. Patient taking differently: On Sundays Tuesdays and Thursdays resume the 2.5 mg dose. All other days take 5 mg.     amLODipine (NORVASC) 2.5 MG tablet Take 2.5 mg by mouth daily. (Patient not taking: Reported on 08/04/2022)     loperamide (IMODIUM) 2 MG capsule Take 2 mg by mouth as needed for diarrhea or loose stools. (Patient not taking: Reported on 08/04/2022)     No current facility-administered medications for this visit.    VITAL SIGNS: BP (!) 150/85   Pulse 84   Temp 98 F (36.7 C) (Tympanic)   Resp 16   Ht 5' (1.524 m)   Wt 91 lb 1.6 oz (41.3 kg)   BMI 17.79 kg/m  Filed Weights   08/04/22 1020  Weight: 91 lb 1.6 oz (41.3 kg)    Estimated body mass index is 17.79 kg/m as calculated from the following:   Height as of this encounter: 5' (1.524 m).   Weight as of this encounter: 91 lb 1.6 oz (41.3 kg).  LABS: CBC:    Component Value Date/Time   WBC 4.1 08/04/2022 1000   HGB 13.2 08/04/2022 1000   HCT 40.9 08/04/2022 1000   PLT 203 08/04/2022 1000   MCV 100.2 (H) 08/04/2022 1000   NEUTROABS 3.1 08/04/2022 1000   LYMPHSABS 0.6 (L) 08/04/2022 1000   MONOABS 0.3 08/04/2022 1000   EOSABS 0.0 08/04/2022 1000   BASOSABS 0.0 08/04/2022 1000   Comprehensive Metabolic Panel:    Component Value Date/Time   NA 132 (L)  08/04/2022 1000   K 4.0 08/04/2022 1000   CL 91 (L) 08/04/2022 1000   CO2 33 (H) 08/04/2022 1000   BUN 13 08/04/2022 1000   CREATININE 0.76 08/04/2022 1000   GLUCOSE 117 (H) 08/04/2022 1000   CALCIUM 9.9 08/04/2022 1000   AST 26 08/04/2022 1000   ALT 20 08/04/2022 1000   ALKPHOS 65 08/04/2022 1000   BILITOT 0.6 08/04/2022 1000   PROT 7.5 08/04/2022 1000   ALBUMIN 4.7 08/04/2022 1000    RADIOGRAPHIC STUDIES: ECHOCARDIOGRAM COMPLETE  Result Date: 08/02/2022    ECHOCARDIOGRAM REPORT   Patient Name:   Agness C Ohlendorf Date of Exam: 08/02/2022 Medical Rec #:  [id number]      Height:       60"$ .0 in Accession #:    [id number]  Weight:       95.0 lb Date of Birth:  1930/06/05     BSA:          1.360 m Patient Age:    78 years       BP:           148/76 mmHg Patient Gender: F              HR:           82 bpm. Exam Location:  [place] Procedure: 2D Echo, Cardiac Doppler and Color Doppler Indications:    I35.0 Nonrheumatic aortic (valve) stenosis  History:        Patient has prior history of Echocardiogram examinations, most                 recent 08/18/2021. Aortic Valve Disease, Signs/Symptoms:Murmur                 and Shortness of Breath; Risk Factors:Hypertension and                 Non-Smoker.  Sonographer:    Pilar Jarvis RDMS, RVT, RDCS Referring Phys: [id number] [facility]  Sonographer Comments: Extremely limited acoustic windows IMPRESSIONS  1. Left ventricular ejection fraction, by estimation, is 60 to 65%. The left ventricle has normal function. The left ventricle has no regional wall motion abnormalities. There is mild left ventricular hypertrophy. Left ventricular diastolic parameters are consistent with Grade I diastolic dysfunction (impaired relaxation).  2. Right ventricular systolic function is normal. The right ventricular size is normal. There is mildly elevated pulmonary artery systolic pressure. The estimated right ventricular systolic pressure is 85.2 mmHg.  3. The mitral valve is  normal in structure. Moderate mitral valve regurgitation. No evidence of mitral stenosis.  4. The aortic valve is normal in structure. There is moderate calcification of the aortic valve. Aortic valve regurgitation is not visualized. Moderate aortic valve stenosis. Aortic valve area, by VTI measures 1.26 cm. Aortic valve mean gradient measures 17.0 mmHg. Aortic valve Vmax measures 2.71 m/s.  5. The inferior vena cava is normal in size with greater than 50% respiratory variability, suggesting right atrial pressure of 3 mmHg. FINDINGS  Left Ventricle: Left ventricular ejection fraction, by estimation, is 60 to 65%. The left ventricle has normal function. The left ventricle has no regional wall motion abnormalities. The left ventricular internal cavity size was normal in size. There is  mild left ventricular hypertrophy. Left ventricular diastolic parameters are consistent with Grade I diastolic dysfunction (impaired relaxation). Right Ventricle: The right ventricular size is normal. No increase in right ventricular wall thickness. Right ventricular systolic function is normal. There is mildly elevated pulmonary artery systolic pressure. The tricuspid regurgitant velocity is 3.04  m/s, and with an assumed right atrial pressure of 5 mmHg, the estimated right ventricular systolic pressure is 77.8 mmHg. Left Atrium: Left atrial size was normal in size. Right Atrium: Right atrial size was normal in size. Pericardium: There is no evidence of pericardial effusion. Mitral Valve: The mitral valve is normal in structure. Moderate mitral valve regurgitation. No evidence of mitral valve stenosis. Tricuspid Valve: The tricuspid valve is normal in structure. Tricuspid valve regurgitation is mild . No evidence of tricuspid stenosis. Aortic Valve: The aortic valve is normal in structure. There is moderate calcification of the aortic valve. Aortic valve regurgitation is not visualized. Moderate aortic stenosis is present. Aortic  valve mean gradient measures 17.0 mmHg. Aortic valve peak gradient measures 29.4 mmHg. Aortic valve  area, by VTI measures 1.26 cm. Pulmonic Valve: The pulmonic valve was normal in structure. Pulmonic valve regurgitation is not visualized. No evidence of pulmonic stenosis. Aorta: The aortic root is normal in size and structure. Venous: The inferior vena cava is normal in size with greater than 50% respiratory variability, suggesting right atrial pressure of 3 mmHg. IAS/Shunts: No atrial level shunt detected by color flow Doppler.  LEFT VENTRICLE PLAX 2D LVIDd:         3.00 cm     Diastology LVIDs:         1.90 cm     LV e' medial:    5.44 cm/s LV PW:         1.20 cm     LV E/e' medial:  14.2 LV IVS:        1.20 cm     LV e' lateral:   6.64 cm/s LVOT diam:     2.00 cm     LV E/e' lateral: 11.6 LV SV:         67 LV SV Index:   49 LVOT Area:     3.14 cm  LV Volumes (MOD) LV vol d, MOD A2C: 72.0 ml LV vol d, MOD A4C: 62.2 ml LV vol s, MOD A2C: 31.1 ml LV vol s, MOD A4C: 28.0 ml LV SV MOD A2C:     40.9 ml LV SV MOD A4C:     62.2 ml LV SV MOD BP:      40.1 ml RIGHT VENTRICLE RV Basal diam:  3.20 cm RV S prime:     10.20 cm/s TAPSE (M-mode): 1.7 cm LEFT ATRIUM             Index        RIGHT ATRIUM           Index LA diam:        3.90 cm 2.87 cm/m   RA Area:     14.80 cm LA Vol (A2C):   48.2 ml 35.43 ml/m  RA Volume:   30.10 ml  22.13 ml/m LA Vol (A4C):   46.2 ml 33.96 ml/m LA Biplane Vol: 47.4 ml 34.84 ml/m  AORTIC VALVE                     PULMONIC VALVE AV Area (Vmax):    1.07 cm      PV Vmax:       0.88 m/s AV Area (Vmean):   1.21 cm      PV Peak grad:  3.1 mmHg AV Area (VTI):     1.26 cm AV Vmax:           271.33 cm/s AV Vmean:          192.667 cm/s AV VTI:            0.528 m AV Peak Grad:      29.4 mmHg AV Mean Grad:      17.0 mmHg LVOT Vmax:         92.70 cm/s LVOT Vmean:        73.900 cm/s LVOT VTI:          0.212 m LVOT/AV VTI ratio: 0.40  AORTA Ao Root diam: 3.10 cm Ao Arch diam: 2.4 cm MITRAL VALVE                 TRICUSPID VALVE MV Area (PHT): 2.60 cm     TR Peak grad:   37.0 mmHg MV Decel  Time: 292 msec     TR Vmax:        304.00 cm/s MV E velocity: 77.10 cm/s MV A velocity: 110.00 cm/s  SHUNTS MV E/A ratio:  0.70         Systemic VTI:  0.21 m                             Systemic Diam: 2.00 cm Ida Rogue MD Electronically signed by Ida Rogue MD Signature Date/Time: 08/02/2022/6:52:51 PM    Final     PERFORMANCE STATUS (ECOG) : 1 - Symptomatic but completely ambulatory  Review of Systems Unless otherwise noted, a complete review of systems is negative.  Physical Exam General: NAD Cardiovascular: regular rate and rhythm Pulmonary: clear ant fields Abdomen: soft, nontender, + bowel sounds GU: no suprapubic tenderness Extremities: no edema, no joint deformities Skin: no rashes Neurological: Weakness but otherwise nonfocal  IMPRESSION/PLAN: Nausea -this appears to be chronic.  Exam is benign and patient is nontoxic-appearing.  Labs show mild hyponatremia, likely with component of dehydration as patient reports she is not drinking much.  Tumor markers pending.  She has a complex GI history that includes gastroparesis and recurrent strictures.  She is followed by GI at [facility] but has not seen Dr. Truddie Crumble since June.  I would like patient to follow-up with GI and she agreed to call to make an appointment.  We will liberalize ondansetron to 3 times daily dosing and refill per her request.  Discussed alternative medications such as metoclopramide or nightly olanzapine but I would prefer to defer to GI.  We will proceed with IV fluids and IV antiemetics today.  May continue as needed supportive care and plan scheduled follow-up in December for repeat imaging.   Patient expressed understanding and was in agreement with this plan. She also understands that She can call clinic at any time with any questions, concerns, or complaints.   Thank you for allowing me to participate in the care of this  very pleasant patient.   Time Total: 20 minutes  Visit consisted of counseling and education dealing with the complex and emotionally intense issues of symptom management in the setting of serious illness.Greater than 50%  of this time was spent counseling and coordinating care related to the above assessment and plan.  Signed by: Altha Harm, PhD, NP-C

## 2022-08-05 LAB — CEA: CEA: 2 ng/mL (ref 0.0–4.7)

## 2022-08-08 ENCOUNTER — Ambulatory Visit: Payer: Medicare Other | Admitting: Physical Therapy

## 2022-08-09 ENCOUNTER — Telehealth: Payer: Self-pay | Admitting: *Deleted

## 2022-08-09 NOTE — Telephone Encounter (Signed)
Spoke with patient. Confirmed with patient that her apt on 08/18/22 can be cnl for lab only since she just had labs drawn last week. She is feeling much better. Nausea has improved. She has not followed up with GI as 'this is not on her agenda at this time. I have many other things to do."

## 2022-08-09 NOTE — Progress Notes (Deleted)
08/10/2022 11:00 PM   Kristina Rowe 12-Sep-1930 [id number]  Referring provider: Gladstone Lighter, MD [place],  [place] [address]  Urological history: 1. Urge incontinence -contributing factors of age, vaginal atrophy, HTN -Myrbetriq cost prohibitive -failed tolterodine  2. rUTI's -contributing factors of age, vaginal atrophy -vaginal estrogen cream   No chief complaint on file.   HPI: Kristina Rowe is a 88 y.o. female who presents today for possible UTI.  UA ***   PMH: Past Medical History:  Diagnosis Date   Ductal carcinoma in situ (DCIS) of left breast    Dysphagia    Esophageal stricture    Gastroparesis    GERD (gastroesophageal reflux disease)    Hiatal hernia    Hypertension    Iron deficiency anemia    Osteoporosis    Pulmonary embolism (HCC)    Scoliosis    UTI (urinary tract infection)     Surgical History: Past Surgical History:  Procedure Laterality Date   BREAST LUMPECTOMY Left 2009   Ductal Carcinoma insitu    CATARACT EXTRACTION, BILATERAL     COLONOSCOPY N/A 03/17/2021   Procedure: COLONOSCOPY;  Surgeon: Lesly Rubenstein, MD;  Location: [facility] ENDOSCOPY;  Service: Endoscopy;  Laterality: N/A;   COLOSTOMY REVISION Right 03/18/2021   Procedure: COLON RESECTION RIGHT;  Surgeon: Olean Ree, MD;  Location: [facility] ORS;  Service: General;  Laterality: Right;   LAPAROSCOPIC PARAESOPHAGEAL HERNIA REPAIR  2009   LAPAROTOMY N/A 03/10/2021   Procedure: EXPLORATORY LAPAROTOMY;  Surgeon: Olean Ree, MD;  Location: [facility] ORS;  Service: General;  Laterality: N/A;   TUBAL LIGATION      Home Medications:  Allergies as of 08/10/2022       Reactions   Metronidazole Other (See Comments)   Other reaction(s): Other Mouth sores Mouth sores Mouth sores Other reaction(s): Other (See Comments) Mouth sores Other reaction(s): Other Mouth sores   Omeprazole Other (See Comments)   Other reaction(s): Arthralgias (intolerance) Other  reaction(s): Other Other reaction(s): Arthralgias (intolerance), Other (See Comments) Other reaction(s): Arthralgias (intolerance) Other reaction(s): Other   Bactrim [sulfamethoxazole-trimethoprim] Nausea Only   Codeine Nausea And Vomiting, Nausea Only   Other reaction(s): Nausea Only But tolerates tramadol But tolerates tramadol Other reaction(s): Nausea Only But tolerates tramadol   Phenergan [promethazine]    "Drowsy"        Medication List        Accurate as of August 09, 2022 11:00 PM. If you have any questions, ask your nurse or doctor.          acetaminophen 500 MG tablet Commonly known as: TYLENOL Take 500 mg by mouth at bedtime as needed for mild pain, fever or headache.   amLODipine 2.5 MG tablet Commonly known as: NORVASC Take 2.5 mg by mouth daily.   Cholecalciferol 50 MCG (2000 UT) Caps Take by mouth.   estradiol 0.1 MG/GM vaginal cream Commonly known as: ESTRACE Estrogen Cream Instruction Discard applicator Apply pea sized amount to tip of finger to urethra before bed. Wash hands well after application. Use Monday, Wednesday and Friday   famotidine 40 MG tablet Commonly known as: PEPCID Take 40 mg by mouth 2 (two) times daily.   folic acid 1 MG tablet Commonly known as: FOLVITE Take 1 mg by mouth daily.   loperamide 2 MG capsule Commonly known as: IMODIUM Take 2 mg by mouth as needed for diarrhea or loose stools.   ondansetron 4 MG disintegrating tablet Commonly known as: ZOFRAN-ODT Take 1  tablet (4 mg total) by mouth every 8 (eight) hours as needed.   polyethylene glycol 17 g packet Commonly known as: MIRALAX / GLYCOLAX Take 17 g by mouth daily.   warfarin 2.5 MG tablet Commonly known as: Coumadin On Sundays and Thursdays resume the 2.5 mg dose.  All other days take 5 mg. What changed: additional instructions   warfarin 5 MG tablet Commonly known as: COUMADIN Take by mouth. Patient taking differently: On Sundays Tuesdays and  Thursdays resume the 2.5 mg dose. All other days take 5 mg. What changed: Another medication with the same name was changed. Make sure you understand how and when to take each.        Allergies:  Allergies  Allergen Reactions   Metronidazole Other (See Comments)    Other reaction(s): Other Mouth sores Mouth sores Mouth sores  Other reaction(s): Other (See Comments) Mouth sores Other reaction(s): Other  Mouth sores    Omeprazole Other (See Comments)    Other reaction(s): Arthralgias (intolerance) Other reaction(s): Other  Other reaction(s): Arthralgias (intolerance), Other (See Comments) Other reaction(s): Arthralgias (intolerance)  Other reaction(s): Other    Bactrim [Sulfamethoxazole-Trimethoprim] Nausea Only   Codeine Nausea And Vomiting and Nausea Only    Other reaction(s): Nausea Only But tolerates tramadol  But tolerates tramadol Other reaction(s): Nausea Only  But tolerates tramadol    Phenergan [Promethazine]     "Drowsy"    Family History: Family History  Problem Relation Age of Onset   Colon cancer Mother    Emphysema Father    Cholelithiasis Sister    Factor V Leiden deficiency Sister    Thyroid disease Sister    Cholelithiasis Sister    Pulmonary embolism Sister    Deep vein thrombosis Brother    Liver cancer Brother    Stroke Brother        died age 21 2021/06/06, [employer]-Day veteran   Cancer Brother        oral cancer   Breast cancer Maternal Uncle     Social History:  reports that she has never smoked. She has never been exposed to tobacco smoke. She has never used smokeless tobacco. She reports that she does not drink alcohol and does not use drugs.  ROS: Pertinent ROS in HPI  Physical Exam: There were no vitals taken for this visit.  Constitutional:  Well nourished. Alert and oriented, No acute distress. HEENT: [place] AT, moist mucus membranes.  Trachea midline, no masses. Cardiovascular: No clubbing, cyanosis, or edema. Respiratory:  Normal respiratory effort, no increased work of breathing. GI: Abdomen is soft, non tender, non distended, no abdominal masses. Liver and spleen not palpable.  No hernias appreciated.  Stool sample for occult testing is not indicated.   GU: No CVA tenderness.  No bladder fullness or masses.  *** external genitalia, *** pubic hair distribution, no lesions.  Normal urethral meatus, no lesions, no prolapse, no discharge.   No urethral masses, tenderness and/or tenderness. No bladder fullness, tenderness or masses. *** vagina mucosa, *** estrogen effect, no discharge, no lesions, *** pelvic support, *** cystocele and *** rectocele noted.  No cervical motion tenderness.  Uterus is freely mobile and non-fixed.  No adnexal/parametria masses or tenderness noted.  Anus and perineum are without rashes or lesions.   ***  Skin: No rashes, bruises or suspicious lesions. Lymph: No cervical or inguinal adenopathy. Neurologic: Grossly intact, no focal deficits, moving all 4 extremities. Psychiatric: Normal mood and affect.    Laboratory Data: Lab Results  Component Value Date   WBC 4.1 08/04/2022   HGB 13.2 08/04/2022   HCT 40.9 08/04/2022   MCV 100.2 (H) 08/04/2022   PLT 203 08/04/2022    Lab Results  Component Value Date   CREATININE 0.76 08/04/2022    Lab Results  Component Value Date   AST 26 08/04/2022   Lab Results  Component Value Date   ALT 20 08/04/2022    Urinalysis ***  I have reviewed the labs.   Pertinent Imaging: N/A  Assessment & Plan:  ***  1. Suspected UTI ***  No follow-ups on file.  These notes generated with voice recognition software. I apologize for typographical errors.  [place], [place] [address]  [place],  [address] [phone]

## 2022-08-10 ENCOUNTER — Ambulatory Visit: Payer: Medicare Other | Admitting: Urology

## 2022-08-10 ENCOUNTER — Encounter: Payer: Self-pay | Admitting: Urology

## 2022-08-10 DIAGNOSIS — N390 Urinary tract infection, site not specified: Secondary | ICD-10-CM

## 2022-08-11 ENCOUNTER — Ambulatory Visit: Payer: Medicare Other | Admitting: Physician Assistant

## 2022-08-15 ENCOUNTER — Ambulatory Visit: Payer: Medicare Other | Admitting: Physical Therapy

## 2022-08-18 ENCOUNTER — Other Ambulatory Visit: Payer: Medicare Other

## 2022-08-19 ENCOUNTER — Ambulatory Visit (INDEPENDENT_AMBULATORY_CARE_PROVIDER_SITE_OTHER): Payer: Medicare Other | Admitting: Physician Assistant

## 2022-08-19 ENCOUNTER — Encounter: Payer: Self-pay | Admitting: Physician Assistant

## 2022-08-19 VITALS — BP 136/82 | HR 86 | Ht 60.0 in | Wt 88.0 lb

## 2022-08-19 DIAGNOSIS — N390 Urinary tract infection, site not specified: Secondary | ICD-10-CM

## 2022-08-19 DIAGNOSIS — R3129 Other microscopic hematuria: Secondary | ICD-10-CM | POA: Diagnosis not present

## 2022-08-19 DIAGNOSIS — R8281 Pyuria: Secondary | ICD-10-CM | POA: Diagnosis not present

## 2022-08-19 DIAGNOSIS — Z87440 Personal history of urinary (tract) infections: Secondary | ICD-10-CM

## 2022-08-19 LAB — URINALYSIS, COMPLETE
Bilirubin, UA: NEGATIVE
Glucose, UA: NEGATIVE
Ketones, UA: NEGATIVE
Nitrite, UA: NEGATIVE
Specific Gravity, UA: 1.015 (ref 1.005–1.030)
Urobilinogen, Ur: 0.2 mg/dL (ref 0.2–1.0)
pH, UA: 6 (ref 5.0–7.5)

## 2022-08-19 LAB — MICROSCOPIC EXAMINATION

## 2022-08-19 NOTE — Patient Instructions (Signed)
Restart Keflex '125mg'$  daily  We will call you with results, call us if the keflex makes you sick  Stay Well

## 2022-08-19 NOTE — Progress Notes (Unsigned)
08/19/2022 4:02 PM   Kristina Rowe 1930-07-04 [id number]  CC: Chief Complaint  Patient presents with   Recurrent UTI   HPI: Kristina Rowe is a 88 y.o. female with PMH OAB wet previously on tolterodine who discontinued Myrbetriq due to cost and recurrent UTI on topical vaginal estrogen cream who presents today for recurrent UTI follow-up.   She was treated for acute cystitis by her PCP 9 days ago with Macrobid x7 days.  Urine culture finalized with ampicillin resistant Klebsiella pneumoniae.  She was previously on suppressive trimethoprim, which she discontinued due to nausea and other side effects.  I switched her to suppressive Keflex, '250mg'$  then 125 mg daily, and she reported nausea and other side effects on this as well despite the dose.  He is not currently on suppressive therapy.  Today she reports she had a lengthy conversation with Dr. Tressia Miners about her nausea and has realized that this is likely chronic.  She has been started on Zofran and this is helping her significantly.  She is willing to retry suppressive antibiotics, as in retrospect she is not sure that these were the source of her nausea.  She denies dysuria or abdominal pain today.  She tolerated the Macrobid that she took earlier this month.  Notably, I did not put her on suppressive Macrobid because several cultures showed resistance to this.  In-office UA today positive for trace intact blood, trace protein, and trace leukocytes; urine microscopy with 11-30 WBCs/HPF and 3-10 RBCs/HPF.  PMH: Past Medical History:  Diagnosis Date   Ductal carcinoma in situ (DCIS) of left breast    Dysphagia    Esophageal stricture    Gastroparesis    GERD (gastroesophageal reflux disease)    Hiatal hernia    Hypertension    Iron deficiency anemia    Osteoporosis    Pulmonary embolism ([place])    Scoliosis    UTI (urinary tract infection)     Surgical History: Past Surgical History:  Procedure Laterality Date   BREAST  LUMPECTOMY Left 2009   Ductal Carcinoma insitu    CATARACT EXTRACTION, BILATERAL     COLONOSCOPY N/A 03/17/2021   Procedure: COLONOSCOPY;  Surgeon: Lesly Rubenstein, MD;  Location: [facility];  Service: Endoscopy;  Laterality: N/A;   COLOSTOMY REVISION Right 03/18/2021   Procedure: COLON RESECTION RIGHT;  Surgeon: Olean Ree, MD;  Location: [facility];  Service: General;  Laterality: Right;   LAPAROSCOPIC PARAESOPHAGEAL HERNIA REPAIR  2009   LAPAROTOMY N/A 03/10/2021   Procedure: EXPLORATORY LAPAROTOMY;  Surgeon: Olean Ree, MD;  Location: [facility] ORS;  Service: General;  Laterality: N/A;   TUBAL LIGATION      Home Medications:  Allergies as of 08/19/2022       Reactions   Metronidazole Other (See Comments)   Other reaction(s): Other Mouth sores Mouth sores Mouth sores Other reaction(s): Other (See Comments) Mouth sores Other reaction(s): Other Mouth sores   Omeprazole Other (See Comments)   Other reaction(s): Arthralgias (intolerance) Other reaction(s): Other Other reaction(s): Arthralgias (intolerance), Other (See Comments) Other reaction(s): Arthralgias (intolerance) Other reaction(s): Other   Bactrim [sulfamethoxazole-trimethoprim] Nausea Only   Codeine Nausea And Vomiting, Nausea Only   Other reaction(s): Nausea Only But tolerates tramadol But tolerates tramadol Other reaction(s): Nausea Only But tolerates tramadol   Phenergan [promethazine]    "Drowsy"        Medication List        Accurate as of August 19, 2022  4:02 PM. If you have  any questions, ask your nurse or doctor.          STOP taking these medications    amLODipine 2.5 MG tablet Commonly known as: NORVASC Stopped by: Debroah Loop, PA-C   loperamide 2 MG capsule Commonly known as: IMODIUM Stopped by: Debroah Loop, PA-C       TAKE these medications    acetaminophen 500 MG tablet Commonly known as: TYLENOL Take 500 mg by mouth at bedtime as needed for mild  pain, fever or headache.   Cholecalciferol 50 MCG (2000 UT) Caps Take by mouth.   estradiol 0.1 MG/GM vaginal cream Commonly known as: ESTRACE Estrogen Cream Instruction Discard applicator Apply pea sized amount to tip of finger to urethra before bed. Wash hands well after application. Use Monday, Wednesday and Friday   famotidine 40 MG tablet Commonly known as: PEPCID Take 40 mg by mouth 2 (two) times daily.   folic acid 1 MG tablet Commonly known as: FOLVITE Take 1 mg by mouth daily.   ondansetron 4 MG disintegrating tablet Commonly known as: ZOFRAN-ODT Take 1 tablet (4 mg total) by mouth every 8 (eight) hours as needed.   polyethylene glycol 17 g packet Commonly known as: MIRALAX / GLYCOLAX Take 17 g by mouth daily.   warfarin 2.5 MG tablet Commonly known as: Coumadin On Sundays and Thursdays resume the 2.5 mg dose.  All other days take 5 mg. What changed: additional instructions   warfarin 5 MG tablet Commonly known as: COUMADIN Take by mouth. Patient taking differently: On Sundays Tuesdays and Thursdays resume the 2.5 mg dose. All other days take 5 mg. What changed: Another medication with the same name was changed. Make sure you understand how and when to take each.        Allergies:  Allergies  Allergen Reactions   Metronidazole Other (See Comments)    Other reaction(s): Other Mouth sores Mouth sores Mouth sores  Other reaction(s): Other (See Comments) Mouth sores Other reaction(s): Other  Mouth sores    Omeprazole Other (See Comments)    Other reaction(s): Arthralgias (intolerance) Other reaction(s): Other  Other reaction(s): Arthralgias (intolerance), Other (See Comments) Other reaction(s): Arthralgias (intolerance)  Other reaction(s): Other    Bactrim [Sulfamethoxazole-Trimethoprim] Nausea Only   Codeine Nausea And Vomiting and Nausea Only    Other reaction(s): Nausea Only But tolerates tramadol  But tolerates tramadol Other reaction(s):  Nausea Only  But tolerates tramadol    Phenergan [Promethazine]     "Drowsy"    Family History: Family History  Problem Relation Age of Onset   Colon cancer Mother    Emphysema Father    Cholelithiasis Sister    Factor V Leiden deficiency Sister    Thyroid disease Sister    Cholelithiasis Sister    Pulmonary embolism Sister    Deep vein thrombosis Brother    Liver cancer Brother    Stroke Brother        died age 57 05-24-2021, [employer]Day veteran   Cancer Brother        oral cancer   Breast cancer Maternal Uncle     Social History:   reports that she has never smoked. She has never been exposed to tobacco smoke. She has never used smokeless tobacco. She reports that she does not drink alcohol and does not use drugs.  Physical Exam: BP 136/82   Pulse 86   Ht 5' (1.524 m)   Wt 88 lb (39.9 kg)   BMI 17.19 kg/m   Constitutional:  Alert and oriented, no acute distress, nontoxic appearing HEENT: [place], AT Cardiovascular: No clubbing, cyanosis, or edema Respiratory: Normal respiratory effort, no increased work of breathing Skin: No rashes, bruises or suspicious lesions Neurologic: Grossly intact, no focal deficits, moving all 4 extremities Psychiatric: Normal mood and affect  Laboratory Data: Results for orders placed or performed in visit on 08/19/22  CULTURE, URINE COMPREHENSIVE   Specimen: Urine   UR  Result Value Ref Range   Urine Culture, Comprehensive Preliminary report    Organism ID, Bacteria Comment   Microscopic Examination   Urine  Result Value Ref Range   WBC, UA 11-30 (A) 0 - 5 /hpf   RBC, Urine 3-10 (A) 0 - 2 /hpf   Epithelial Cells (non renal) 0-10 0 - 10 /hpf   Bacteria, UA Few None seen/Few  Urinalysis, Complete  Result Value Ref Range   Specific Gravity, UA 1.015 1.005 - 1.030   pH, UA 6.0 5.0 - 7.5   Color, UA Yellow Yellow   Appearance Ur Clear Clear   Leukocytes,UA Trace (A) Negative   Protein,UA Trace (A) Negative/Trace   Glucose, UA Negative  Negative   Ketones, UA Negative Negative   RBC, UA Trace (A) Negative   Bilirubin, UA Negative Negative   Urobilinogen, Ur 0.2 0.2 - 1.0 mg/dL   Nitrite, UA Negative Negative   Microscopic Examination See below:    Assessment & Plan:   1. Recurrent UTI UA today notable for pyuria and microscopic hematuria, however she is asymptomatic.  We will send for culture and reach out with her results, treating as indicated.  Okay to resume Keflex 125 mg daily for UTI prevention.  If she poorly tolerates this, she will contact us.  Otherwise, we will plan to see her back in 3 months for symptom recheck and repeat UA.  If she has persistent microscopic hematuria at that time, may consider hematuria work-up, though I suspect this is due to Xarelto use and in the setting of chronic cystitis. - Urinalysis, Complete - CULTURE, URINE COMPREHENSIVE  Return in about 3 months (around 11/18/2022) for symptom recheck and UA.  Debroah Loop, PA-C  [facility] [address] [place],  [address] [phone]

## 2022-08-22 ENCOUNTER — Telehealth: Payer: Self-pay | Admitting: Physician Assistant

## 2022-08-22 ENCOUNTER — Ambulatory Visit: Payer: Medicare Other | Admitting: Physical Therapy

## 2022-08-22 LAB — CULTURE, URINE COMPREHENSIVE

## 2022-08-22 NOTE — Telephone Encounter (Signed)
Patient called and lmom requesting results from visit on 9/22. She said she is still having a lot of urgency, and would like to know her results.

## 2022-08-22 NOTE — Telephone Encounter (Signed)
Spoke with patient and advised that urine culture is still in preliminary status and we will call once complete

## 2022-08-29 ENCOUNTER — Ambulatory Visit: Payer: Medicare Other | Admitting: Physical Therapy

## 2022-09-05 ENCOUNTER — Ambulatory Visit: Payer: Medicare Other | Admitting: Physical Therapy

## 2022-09-12 ENCOUNTER — Ambulatory Visit: Payer: Medicare Other | Admitting: Physical Therapy

## 2022-09-19 ENCOUNTER — Encounter: Payer: Self-pay | Admitting: Surgery

## 2022-09-19 ENCOUNTER — Ambulatory Visit: Payer: Medicare Other | Admitting: Physical Therapy

## 2022-09-19 ENCOUNTER — Ambulatory Visit (INDEPENDENT_AMBULATORY_CARE_PROVIDER_SITE_OTHER): Payer: Medicare Other | Admitting: Surgery

## 2022-09-19 VITALS — BP 135/79 | HR 91 | Temp 97.9°F | Wt 90.6 lb

## 2022-09-19 DIAGNOSIS — C182 Malignant neoplasm of ascending colon: Secondary | ICD-10-CM | POA: Diagnosis not present

## 2022-09-19 NOTE — Progress Notes (Signed)
09/19/2022  History of Present Illness: Kristina Rowe is a 88 y.o. female s/p open right colectomy for T3N1c colon cancer on 03/18/21.  She presents for follow up.  She had her last CT scan on 05/16/22, which I have personally viewed, showing no evidence of metastatic disease.  Her last CEA from 08/04/22 was 2.0.  She reports that she's doing well.  Denies any abdominal pain, nausea.  Reports some occasional constipation which she controls with prune juice or miralax.  Denies any blood in her stool.  She has a decent appetite, and stable weight.  Past Medical History: Past Medical History:  Diagnosis Date   Ductal carcinoma in situ (DCIS) of left breast    Dysphagia    Esophageal stricture    Gastroparesis    GERD (gastroesophageal reflux disease)    Hiatal hernia    Hypertension    Iron deficiency anemia    Osteoporosis    Pulmonary embolism ([facility])    Scoliosis    UTI (urinary tract infection)      Past Surgical History: Past Surgical History:  Procedure Laterality Date   BREAST LUMPECTOMY Left 2009   Ductal Carcinoma insitu    CATARACT EXTRACTION, BILATERAL     COLONOSCOPY N/A 03/17/2021   Procedure: COLONOSCOPY;  Surgeon: Lesly Rubenstein, MD;  Location: [facility];  Service: Endoscopy;  Laterality: N/A;   COLOSTOMY REVISION Right 03/18/2021   Procedure: COLON RESECTION RIGHT;  Surgeon: Olean Ree, MD;  Location: [facility] ORS;  Service: General;  Laterality: Right;   LAPAROSCOPIC PARAESOPHAGEAL HERNIA REPAIR  2009   LAPAROTOMY N/A 03/10/2021   Procedure: EXPLORATORY LAPAROTOMY;  Surgeon: Olean Ree, MD;  Location: [facility] OR[facility];  Service: General;  Laterality: N/A;   TUBAL LIGATION      Home Medications: Prior to Admission medications   Medication Sig Start Date End Date Taking? Authorizing Provider  acetaminophen (TYLENOL) 500 MG tablet Take 500 mg by mouth at bedtime as needed for mild pain, fever or headache.   Yes [provider]  Cholecalciferol 50 MCG (2000  UT) CAPS Take by mouth.   Yes [provider]  estradiol (ESTRACE) 0.1 MG/GM vaginal cream Estrogen Cream Instruction Discard applicator Apply pea sized amount to tip of finger to urethra before bed. Wash hands well after application. Use Monday, Wednesday and Friday 03/04/21  Yes Billey Co, MD  famotidine (PEPCID) 40 MG tablet Take 40 mg by mouth 2 (two) times daily. 06/15/22  Yes [provider]  folic acid (FOLVITE) 1 MG tablet Take 1 mg by mouth daily. 01/17/19  Yes [provider]  ondansetron (ZOFRAN-ODT) 4 MG disintegrating tablet Take 1 tablet (4 mg total) by mouth every 8 (eight) hours as needed. 08/04/22  Yes Borders, Kirt Boys, NP  polyethylene glycol (MIRALAX / GLYCOLAX) 17 g packet Take 17 g by mouth daily.   Yes [provider]  warfarin (COUMADIN) 2.5 MG tablet On Sundays and Thursdays resume the 2.5 mg dose.  All other days take 5 mg. Patient taking differently: On Sundays Tuesdays and  Thursdays resume the 2.5 mg dose.  All other days take 5 mg. 03/14/21  Yes Ronny Bacon, MD  warfarin (COUMADIN) 5 MG tablet Take by mouth. Patient taking differently: On Sundays Tuesdays and Thursdays resume the 2.5 mg dose. All other days take 5 mg. 03/26/21  Yes [provider]    Allergies: Allergies  Allergen Reactions   Metronidazole Other (See Comments)    Other reaction(s): Other Mouth sores  Mouth sores Mouth sores  Other reaction(s): Other (See Comments) Mouth sores Other reaction(s): Other  Mouth sores    Omeprazole Other (See Comments)    Other reaction(s): Arthralgias (intolerance) Other reaction(s): Other  Other reaction(s): Arthralgias (intolerance), Other (See Comments) Other reaction(s): Arthralgias (intolerance)  Other reaction(s): Other    Bactrim [Sulfamethoxazole-Trimethoprim] Nausea Only   Codeine Nausea And Vomiting and Nausea Only    Other reaction(s): Nausea Only But tolerates tramadol  But tolerates  tramadol Other reaction(s): Nausea Only  But tolerates tramadol    Phenergan [Promethazine]     "Drowsy"    Review of Systems: Review of Systems  Constitutional:  Negative for chills and fever.  Respiratory:  Negative for shortness of breath.   Cardiovascular:  Negative for chest pain.  Gastrointestinal:  Positive for constipation. Negative for abdominal pain, nausea and vomiting.  Skin:  Negative for rash.    Physical Exam BP 135/79   Pulse 91   Temp 97.9 F (36.6 C) (Oral)   Wt 90 lb 9.6 oz (41.1 kg)   SpO2 99%   BMI 17.69 kg/m  CONSTITUTIONAL: No acute distress HEENT:  Normocephalic, atraumatic, extraocular motion intact. RESPIRATORY:  Lungs are clear, and breath sounds are equal bilaterally. Normal respiratory effort without pathologic use of accessory muscles. CARDIOVASCULAR: Regular rhythm and rate. GI: The abdomen is soft, non-distended, non-tender to palpation, with some stable chronic discomfort in the LUQ.  Midline incision well healed, without any skin excoriation, and without any hernia. MUSCULOSKELETAL:  Significant kyphosis of the back, no peripheral edema. PSYCH:  Alert and oriented to person, place and time. Affect is normal.  Labs/Imaging: CT chest, abdomen, pelvis on 05/16/22: IMPRESSION: 1. Status post right hemicolectomy and ileocolic anastomosis. 2. No noncontrast evidence of current or metastatic disease in the chest, abdomen, or pelvis. 3. Hiatal hernia. 4. Coronary artery calcifications. 5. Aortic valve calcifications. Correlate for echocardiographic evidence of aortic valve dysfunction. 6. Aortic Atherosclerosis (ICD10-I70.0).  CEA on 08/04/22 --> 2.0  Assessment and Plan: This is a 88 y.o. female s/p right colectomy for colon cancer.  --Patient is doing well and again discussed her remarkable recovery given her two back to back surgeries in 2022.  She uses a cane for ambulation and sometimes a walker, but she mentions that is out of caution  just in case.  Her appetite and weight are stable and denies any significant abdominal issues.  Her CT scan and CEA are good, without any evidence of metastatic disease or masses, and CEA normal.   --Patient will follow up with me in 6 months.  She's currently scheduled for repeat CT scan on 11/14/22.  I spent 20 minutes dedicated to the care of this patient on the date of this encounter to include pre-visit review of records, face-to-face time with the patient discussing diagnosis and management, and any post-visit coordination of care.   [name], [facility]

## 2022-09-19 NOTE — Patient Instructions (Addendum)
If you have any concerns or questions, please feel free to call our office. Follow up in 6 months.    High-Protein and High-Calorie Diet  Eating high-protein and high-calorie foods can help you to gain weight, heal after an injury, and recover after an illness or surgery. The specific amount of daily protein and calories you need depends on: Your body weight. The reason this diet is recommended for you. Generally, a high-protein, high-calorie diet involves: Eating 250-500 extra calories each day. Making sure that you get enough of your daily calories from protein. Ask your health care provider how many of your calories should come from protein. Talk with a health care provider or a dietitian about how much protein and how many calories you need each day. Follow the diet as directed by your health care provider. What are tips for following this plan?  Reading food labels Check the nutrition facts label for calories, grams of fat and protein. Items with more than 4 grams of protein are high-protein foods. Preparing meals Add whole milk, half-and-half, or heavy cream to cereal, pudding, soup, or hot cocoa. Add whole milk to instant breakfast drinks. Add peanut butter to oatmeal or smoothies. Add powdered milk to baked goods, smoothies, or milkshakes. Add powdered milk, cream, or butter to mashed potatoes. Add cheese to cooked vegetables. Make whole-milk yogurt parfaits. Top them with granola, fruit, or nuts. Add cottage cheese to fruit. Add avocado, cheese, or both to sandwiches or salads. Add avocado to smoothies. Add meat, poultry, or seafood to rice, pasta, casseroles, salads, and soups. Use mayonnaise when making egg salad, chicken salad, or tuna salad. Use peanut butter as a dip for fruits and vegetables or as a topping for pretzels, celery, or crackers. Add beans to casseroles, dips, and spreads. Add pureed beans to sauces and soups. Replace calorie-free drinks with  calorie-containing drinks, such as milk and fruit juice. Replace water with milk or heavy cream when making foods such as oatmeal, pudding, or cocoa. Add oil or butter to cooked vegetables and grains. Add cream cheese to sandwiches or as a topping on crackers and bread. Make cream-based pastas and soups. General information Ask your health care provider if you should take a nutritional supplement. Try to eat six small meals each day instead of three large meals. A general goal is to eat every 2 to 3 hours. Eat a balanced diet. In each meal, include one food that is high in protein and one food with fat in it. Keep nutritious snacks available, such as nuts, trail mixes, dried fruit, and yogurt. If you have kidney disease or diabetes, talk with your health care provider about how much protein is safe for you. Too much protein may put extra stress on your kidneys. Drink your calories. Choose high-calorie drinks and have them after your meals. Consider setting a timer to remind you to eat. You will want to eat even if you do not feel very hungry. What high-protein foods should I eat?  Vegetables Soybeans. Peas. Grains Quinoa. Bulgur wheat. Buckwheat. Meats and other proteins Beef, pork, and poultry. Fish and seafood. Eggs. Tofu. Textured vegetable protein (TVP). Peanut butter. Nuts and seeds. Dried beans. Protein powders. Hummus. Dairy Whole milk. Whole-milk yogurt. Powdered milk. Cheese. Yahoo. Eggnog. Beverages High-protein supplement drinks. Soy milk. Other foods Protein bars. The items listed above may not be a complete list of foods and beverages you can eat and drink. Contact a dietitian for more information. What high-calorie foods should I  eat? Fruits Dried fruit. Fruit leather. Canned fruit in syrup. Fruit juice. Avocado. Vegetables Vegetables cooked in oil or butter. Fried potatoes. Grains Pasta. Quick breads. Muffins. Pancakes. Ready-to-eat cereal. Meats and other  proteins Peanut butter. Nuts and seeds. Dairy Heavy cream. Whipped cream. Cream cheese. Sour cream. Ice cream. Custard. Pudding. Whole milk dairy products. Beverages Meal-replacement beverages. Nutrition shakes. Fruit juice. Seasonings and condiments Salad dressing. Mayonnaise. Alfredo sauce. Fruit preserves or jelly. Honey. Syrup. Sweets and desserts Cake. Cookies. Pie. Pastries. Candy bars. Chocolate. Fats and oils Butter or margarine. Oil. Gravy. Other foods Meal-replacement bars. The items listed above may not be a complete list of foods and beverages you can eat and drink. Contact a dietitian for more information. Summary A high-protein, high-calorie diet can help you gain weight or heal faster after an injury, illness, or surgery. To increase your protein and calories, add ingredients such as whole milk, peanut butter, cheese, beans, meat, or seafood to meal items. To get enough extra calories each day, include high-calorie foods and beverages at each meal. Adding a high-calorie drink or shake can be an easy way to help you get enough calories each day. Talk with your healthcare provider or dietitian about the best options for you. This information is not intended to replace advice given to you by your health care provider. Make sure you discuss any questions you have with your health care provider. Document Revised: 10/18/2020 Document Reviewed: 10/18/2020 Elsevier Patient Education  [place].

## 2022-09-26 ENCOUNTER — Ambulatory Visit: Payer: Medicare Other | Admitting: Physical Therapy

## 2022-10-03 ENCOUNTER — Ambulatory Visit: Payer: Medicare Other | Admitting: Physical Therapy

## 2022-10-10 ENCOUNTER — Ambulatory Visit: Payer: Medicare Other | Admitting: Physical Therapy

## 2022-10-17 ENCOUNTER — Ambulatory Visit: Payer: Medicare Other | Admitting: Physical Therapy

## 2022-11-14 ENCOUNTER — Ambulatory Visit
Admission: RE | Admit: 2022-11-14 | Discharge: 2022-11-14 | Disposition: A | Discharge status: Home / Self Care | Payer: Medicare Other | Source: Ambulatory Visit | Attending: Oncology | Admitting: Oncology

## 2022-11-14 DIAGNOSIS — Z85038 Personal history of other malignant neoplasm of large intestine: Secondary | ICD-10-CM | POA: Insufficient documentation

## 2022-11-14 DIAGNOSIS — D509 Iron deficiency anemia, unspecified: Secondary | ICD-10-CM | POA: Insufficient documentation

## 2022-11-14 DIAGNOSIS — Z08 Encounter for follow-up examination after completed treatment for malignant neoplasm: Secondary | ICD-10-CM | POA: Diagnosis present

## 2022-11-14 LAB — POCT I-STAT CREATININE: Creatinine, Ser: 0.7 mg/dL (ref 0.44–1.00)

## 2022-11-14 MED ORDER — IOHEXOL 300 MG/ML  SOLN
80.0000 mL | Freq: Once | INTRAMUSCULAR | Status: AC | PRN
  Administered 2022-11-14: 80 mL via INTRAVENOUS

## 2022-11-23 ENCOUNTER — Ambulatory Visit (INDEPENDENT_AMBULATORY_CARE_PROVIDER_SITE_OTHER): Payer: Medicare Other | Admitting: Physician Assistant

## 2022-11-23 ENCOUNTER — Encounter: Payer: Self-pay | Admitting: Physician Assistant

## 2022-11-23 VITALS — BP 174/90 | HR 77 | Ht 60.0 in | Wt 90.0 lb

## 2022-11-23 DIAGNOSIS — R3129 Other microscopic hematuria: Secondary | ICD-10-CM | POA: Diagnosis not present

## 2022-11-23 DIAGNOSIS — N390 Urinary tract infection, site not specified: Secondary | ICD-10-CM | POA: Diagnosis not present

## 2022-11-23 LAB — URINALYSIS, COMPLETE
Bilirubin, UA: NEGATIVE
Glucose, UA: NEGATIVE
Ketones, UA: NEGATIVE
Leukocytes,UA: NEGATIVE
Nitrite, UA: NEGATIVE
Specific Gravity, UA: 1.015 (ref 1.005–1.030)
Urobilinogen, Ur: 0.2 mg/dL (ref 0.2–1.0)
pH, UA: 6 (ref 5.0–7.5)

## 2022-11-23 LAB — MICROSCOPIC EXAMINATION
Epithelial Cells (non renal): >10 /hpf — AB (ref 0–10)
RBC, Urine: >30 /hpf — AB (ref 0–2)

## 2022-11-23 MED ORDER — CEPHALEXIN 250 MG PO TABS: ORAL_TABLET | ORAL | 8 refills | Status: DC

## 2022-11-23 NOTE — Progress Notes (Signed)
11/23/2022 10:55 AM   Kristina Rowe 10-04-1930 [id number]  CC: Chief Complaint  Patient presents with   Recurrent UTI   HPI: Kristina Rowe is a 88 y.o. 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   Today she reports she is doing very well on Keflex 125 mg daily.  Her nausea has improved.  She continues to use estrogen cream without difficulty.  Overall, she feels that her quality of life is greatly improved on suppressive Keflex and she has not had any breakthrough infections.  She wishes to continue it.  Underwent CT chest abdomen and pelvis with contrast on 11/14/2022 with no significant urologic findings.  In-office UA today positive for 2+ blood and 1+ protein; urine microscopy with >30 RBCs/HPF, >10 epithelial cells/hpf, and moderate bacteria.  PMH: Past Medical History:  Diagnosis Date   Ductal carcinoma in situ (DCIS) of left breast    Dysphagia    Esophageal stricture    Gastroparesis    GERD (gastroesophageal reflux disease)    Hiatal hernia    Hypertension    Iron deficiency anemia    Osteoporosis    Pulmonary embolism ([place])    Scoliosis    UTI (urinary tract infection)     Surgical History: Past Surgical History:  Procedure Laterality Date   BREAST LUMPECTOMY Left 2009   Ductal Carcinoma insitu    CATARACT EXTRACTION, BILATERAL     COLONOSCOPY N/A 03/17/2021   Procedure: COLONOSCOPY;  Surgeon: Lesly Rubenstein, MD;  Location: [facility] END[facility];  Service: Endoscopy;  Laterality: N/A;   COLOSTOMY REVISION Right 03/18/2021   Procedure: COLON RESECTION RIGHT;  Surgeon: Olean Ree, MD;  Location: [facility] ORS;  Service: General;  Laterality: Right;   LAPAROSCOPIC PARAESOPHAGEAL HERNIA REPAIR  2009   LAPAROTOMY N/A 03/10/2021   Procedure: EXPLORATORY LAPAROTOMY;  Surgeon: Olean Ree, MD;  Location: [facility] ORS;  Service: General;  Laterality: N/A;   TUBAL LIGATION      Home Medications:  Allergies as of 11/23/2022       Reactions   Metronidazole Other (See Comments)   Other reaction(s): Other Mouth sores Mouth sores Mouth sores Other reaction(s): Other (See Comments) Mouth sores Other reaction(s): Other Mouth sores   Omeprazole Other (See Comments)   Other reaction(s): Arthralgias (intolerance) Other reaction(s): Other Other reaction(s): Arthralgias (intolerance), Other (See Comments) Other reaction(s): Arthralgias (intolerance) Other reaction(s): Other   Bactrim [sulfamethoxazole-trimethoprim] Nausea Only   Codeine Nausea And Vomiting, Nausea Only   Other reaction(s): Nausea Only But tolerates tramadol But tolerates tramadol Other reaction(s): Nausea Only But tolerates tramadol   Phenergan [promethazine]    "Drowsy"        Medication List        Accurate as of November 23, 2022 10:55 AM. If you have any questions, ask your nurse or doctor.          acetaminophen 500 MG tablet Commonly known as: TYLENOL Take 500 mg by mouth at bedtime as needed for mild pain, fever or headache.   Cholecalciferol 50 MCG (2000 UT) Caps Take by mouth.   estradiol 0.1 MG/GM vaginal cream Commonly known as: ESTRACE Estrogen Cream Instruction Discard applicator Apply pea sized amount to tip of finger to urethra before bed. Wash hands well after application. Use Monday, Wednesday and Friday   famotidine 40 MG tablet Commonly  known as: PEPCID Take 40 mg by mouth 2 (two) times daily.   folic acid 1 MG tablet Commonly known as: FOLVITE Take 1 mg by mouth daily.   mirtazapine 15 MG tablet Commonly known as: REMERON Take 1 tablet by mouth at bedtime.   ondansetron 4 MG disintegrating tablet Commonly known as: ZOFRAN-ODT Take 1 tablet (4 mg total) by mouth every 8 (eight) hours as needed.   polyethylene glycol 17 g packet Commonly known as: MIRALAX  / GLYCOLAX Take 17 g by mouth daily.   warfarin 2.5 MG tablet Commonly known as: Coumadin On Sundays and Thursdays resume the 2.5 mg dose.  All other days take 5 mg. What changed: additional instructions   warfarin 5 MG tablet Commonly known as: COUMADIN Take by mouth. Patient taking differently: On Sundays Tuesdays and Thursdays resume the 2.5 mg dose. All other days take 5 mg. What changed: Another medication with the same name was changed. Make sure you understand how and when to take each.   Xarelto 20 MG Tabs tablet Generic drug: rivaroxaban Take 20 mg by mouth daily.        Allergies:  Allergies  Allergen Reactions   Metronidazole Other (See Comments)    Other reaction(s): Other Mouth sores Mouth sores Mouth sores  Other reaction(s): Other (See Comments) Mouth sores Other reaction(s): Other  Mouth sores    Omeprazole Other (See Comments)    Other reaction(s): Arthralgias (intolerance) Other reaction(s): Other  Other reaction(s): Arthralgias (intolerance), Other (See Comments) Other reaction(s): Arthralgias (intolerance)  Other reaction(s): Other    Bactrim [Sulfamethoxazole-Trimethoprim] Nausea Only   Codeine Nausea And Vomiting and Nausea Only    Other reaction(s): Nausea Only But tolerates tramadol  But tolerates tramadol Other reaction(s): Nausea Only  But tolerates tramadol    Phenergan [Promethazine]     "Drowsy"    Family History: Family History  Problem Relation Age of Onset   Colon cancer Mother    Emphysema Father    Cholelithiasis Sister    Factor V Leiden deficiency Sister    Thyroid disease Sister    Cholelithiasis Sister    Pulmonary embolism Sister    Deep vein thrombosis Brother    Liver cancer Brother    Stroke Brother        died age 27 13-May-2021, D-Day veteran   Cancer Brother        oral cancer   Breast cancer Maternal Uncle     Social History:   reports that she has never smoked. She has never been exposed to  tobacco smoke. She has never used smokeless tobacco. She reports that she does not drink alcohol and does not use drugs.  Physical Exam: There were no vitals taken for this visit.  Constitutional:  Alert and oriented, no acute distress, nontoxic appearing HEENT: [place], AT Cardiovascular: No clubbing, cyanosis, or edema Respiratory: Normal respiratory effort, no increased work of breathing Skin: No rashes, bruises or suspicious lesions Neurologic: Grossly intact, no focal deficits, moving all 4 extremities Psychiatric: Normal mood and affect  Laboratory Data: Results for orders placed or performed in visit on 11/23/22  Microscopic Examination   Urine  Result Value Ref Range   WBC, UA 0-5 0 - 5 /hpf   RBC, Urine >30 (A) 0 - 2 /hpf   Epithelial Cells (non renal) >10 (A) 0 - 10 /hpf   Bacteria, UA Moderate (A) None seen/Few  Urinalysis, Complete  Result Value Ref Range   Specific Gravity, UA 1.015  1.005 - 1.030   pH, UA 6.0 5.0 - 7.5   Color, UA Yellow Yellow   Appearance Ur Clear Clear   Leukocytes,UA Negative Negative   Protein,UA 1+ (A) Negative/Trace   Glucose, UA Negative Negative   Ketones, UA Negative Negative   RBC, UA 2+ (A) Negative   Bilirubin, UA Negative Negative   Urobilinogen, Ur 0.2 0.2 - 1.0 mg/dL   Nitrite, UA Negative Negative   Microscopic Examination See below:    Assessment & Plan:   1. Recurrent UTI Well-managed on daily suppressive Keflex 125 mg.  Will continue this.  We discussed that I like to discontinue suppressive antibiotics after 6 to 12 months of continuous therapy to see if this has broken the cycle of infection.  She wishes to continue it as long as possible, so we will plan to refill for an additional 9 months for total of 12 months of therapy before discontinuing it.  I will see her back next year, sooner if needed.  I encouraged her to continue vaginal estrogen cream in the interim. - Urinalysis, Complete - Cephalexin 250 MG tablet; Take 0.5  tablets ('125mg'$ ) daily for UTI prevention.  Dispense: 15 tablet; Refill: 8  2. Microscopic hematuria Persistent microscopic hematuria.  Fortunately, no significant urologic findings on recent contrast CT.  We discussed that this is not a definitive study as it did not include a delayed phase, however I am hesitant to expose her to additional radiation so soon after her last CT scan and she agrees.  I did offer her a repeat cystoscopy for further evaluation of her microscopic hematuria, but she declined this due to her age, which is reasonable.  Will revisit in the future if she develops gross hematuria or pain.  Return in about 1 year (around 11/24/2023) for Annual f/u with UA.  Debroah Loop, PA-C  [facility] [address], [place], [place] [address] [phone]

## 2022-11-25 ENCOUNTER — Inpatient Hospital Stay: Payer: Medicare Other | Admitting: Oncology

## 2022-11-25 ENCOUNTER — Inpatient Hospital Stay: Payer: Medicare Other

## 2022-12-15 ENCOUNTER — Encounter: Payer: Self-pay | Admitting: Oncology

## 2022-12-15 ENCOUNTER — Inpatient Hospital Stay: Payer: Medicare Other

## 2022-12-15 ENCOUNTER — Inpatient Hospital Stay: Payer: Medicare Other | Attending: Oncology | Admitting: Oncology

## 2022-12-15 VITALS — BP 139/85 | HR 76 | Temp 97.0°F | Resp 16 | Wt 89.0 lb

## 2022-12-15 DIAGNOSIS — D509 Iron deficiency anemia, unspecified: Secondary | ICD-10-CM

## 2022-12-15 DIAGNOSIS — C182 Malignant neoplasm of ascending colon: Secondary | ICD-10-CM | POA: Insufficient documentation

## 2022-12-15 DIAGNOSIS — Z08 Encounter for follow-up examination after completed treatment for malignant neoplasm: Secondary | ICD-10-CM

## 2022-12-15 LAB — CBC WITH DIFFERENTIAL/PLATELET
Abs Immature Granulocytes: 0.02 10*3/uL (ref 0.00–0.07)
Basophils Absolute: 0 10*3/uL (ref 0.0–0.1)
Basophils Relative: 1 %
Eosinophils Absolute: 0 10*3/uL (ref 0.0–0.5)
Eosinophils Relative: 0 %
HCT: 40.3 % (ref 36.0–46.0)
Hemoglobin: 12.9 g/dL (ref 12.0–15.0)
Immature Granulocytes: 0 %
Lymphocytes Relative: 13 %
Lymphs Abs: 0.7 10*3/uL (ref 0.7–4.0)
MCH: 32.2 pg (ref 26.0–34.0)
MCHC: 32 g/dL (ref 30.0–36.0)
MCV: 100.5 fL — ABNORMAL HIGH (ref 80.0–100.0)
Monocytes Absolute: 0.2 10*3/uL (ref 0.1–1.0)
Monocytes Relative: 4 %
Neutro Abs: 4.2 10*3/uL (ref 1.7–7.7)
Neutrophils Relative %: 82 %
Platelets: 203 10*3/uL (ref 150–400)
RBC: 4.01 MIL/uL (ref 3.87–5.11)
RDW: 12.1 % (ref 11.5–15.5)
WBC: 5.2 10*3/uL (ref 4.0–10.5)
nRBC: 0 % (ref 0.0–0.2)

## 2022-12-15 LAB — IRON AND TIBC
Iron: 99 ug/dL (ref 28–170)
Saturation Ratios: 32 % — ABNORMAL HIGH (ref 10.4–31.8)
TIBC: 311 ug/dL (ref 250–450)
UIBC: 212 ug/dL

## 2022-12-15 LAB — FERRITIN: Ferritin: 190 ng/mL (ref 11–307)

## 2022-12-15 NOTE — Progress Notes (Signed)
Patient here for oncology follow-up appointment, expresses concerns of constipation

## 2022-12-15 NOTE — Progress Notes (Signed)
Hematology/Oncology Consult note [facility]  [phone] [phone]  Patient Care Team: Gladstone Lighter, MD as PCP - General (Internal Medicine) Kate Sable, MD as PCP - Cardiology (Cardiology) Sindy Guadeloupe, MD as Consulting Physician (Hematology and Oncology)   Name of the patient: Kristina Rowe  [id number]  Oct 16, 1930   Date of visit: 12/15/22  Diagnosis- stage III colon cancer   Chief complaint/ Reason for visit-discuss CT scan results and further management  Heme/Onc history: Patient is a 89 year old female who initially presented to the ER with symptoms of significant abdominal pain.  CT abdomen and pelvis on 03/10/2021 showed volvulus along with a large hiatal hernia.  Patient initially had an exploratory laparotomy with lysis of adhesion by Dr. Hampton Abbot.  She then presented back to the ER with similar complaints and a repeat CT abdomen at that time showed bulky ascending colon mass compatible with carcinoma and possible local regional adenopathy.  No recurrent volvulus.  Patient underwent open right colectomy on 03/18/2021.  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  All regional lymph nodes 32 of them were negative for tumor but there was a tumor deposit present at 1 site.  PT3PN1C.   Risks versus benefits of adjuvant chemotherapy was discussed and patient was not deemed to be a candidate for adjuvant chemotherapy and she did not wish to go through chemotherapy herself.  Anemia work-up was consistent with iron deficiency and patient received 2 doses of Feraheme  Interval history-patient is doing well for her age.  Denies any specific complaints at this time.  Appetite and weight have remained stable.  Bowel movements are regular.  She remains independent of her ADLs.  ECOG PS- 2 Pain scale- 0   Review of systems- Review of Systems  Constitutional:   Negative for chills, fever, malaise/fatigue and weight loss.  HENT:  Negative for congestion, ear discharge and nosebleeds.   Eyes:  Negative for blurred vision.  Respiratory:  Negative for cough, hemoptysis, sputum production, shortness of breath and wheezing.   Cardiovascular:  Negative for chest pain, palpitations, orthopnea and claudication.  Gastrointestinal:  Negative for abdominal pain, blood in stool, constipation, diarrhea, heartburn, melena, nausea and vomiting.  Genitourinary:  Negative for dysuria, flank pain, frequency, hematuria and urgency.  Musculoskeletal:  Negative for back pain, joint pain and myalgias.  Skin:  Negative for rash.  Neurological:  Negative for dizziness, tingling, focal weakness, seizures, weakness and headaches.  Endo/Heme/Allergies:  Does not bruise/bleed easily.  Psychiatric/Behavioral:  Negative for depression and suicidal ideas. The patient does not have insomnia.       Allergies  Allergen Reactions   Metronidazole Other (See Comments)    Other reaction(s): Other Mouth sores Mouth sores Mouth sores  Other reaction(s): Other (See Comments) Mouth sores Other reaction(s): Other  Mouth sores    Omeprazole Other (See Comments)    Other reaction(s): Arthralgias (intolerance) Other reaction(s): Other  Other reaction(s): Arthralgias (intolerance), Other (See Comments) Other reaction(s): Arthralgias (intolerance)  Other reaction(s): Other    Bactrim [Sulfamethoxazole-Trimethoprim] Nausea Only   Codeine Nausea And Vomiting and Nausea Only    Other reaction(s): Nausea Only But tolerates tramadol  But tolerates tramadol Other reaction(s): Nausea Only  But tolerates tramadol    Phenergan [Promethazine]     "Drowsy"     Past Medical History:  Diagnosis Date   Ductal carcinoma in situ (DCIS) of left breast    Dysphagia  Esophageal stricture    Gastroparesis    GERD (gastroesophageal reflux disease)    Hiatal hernia     Hypertension    Iron deficiency anemia    Osteoporosis    Pulmonary embolism ([place])    Scoliosis    UTI (urinary tract infection)      Past Surgical History:  Procedure Laterality Date   BREAST LUMPECTOMY Left 2009   Ductal Carcinoma insitu    CATARACT EXTRACTION, BILATERAL     COLONOSCOPY N/A 03/17/2021   Procedure: COLONOSCOPY;  Surgeon: Lesly Rubenstein, MD;  Location: [facility] END[facility]COPY;  Service: Endoscopy;  Laterality: N/A;   COLOSTOMY REVISION Right 03/18/2021   Procedure: COLON RESECTION RIGHT;  Surgeon: Olean Ree, MD;  Location: [facility] ORS;  Service: General;  Laterality: Right;   LAPAROSCOPIC PARAESOPHAGEAL HERNIA REPAIR  2009   LAPAROTOMY N/A 03/10/2021   Procedure: EXPLORATORY LAPAROTOMY;  Surgeon: Olean Ree, MD;  Location: [facility] ORS;  Service: General;  Laterality: N/A;   TUBAL LIGATION      Social History   Socioeconomic History   Marital status: Married    Spouse name: Not on file   Number of children: Not on file   Years of education: Not on file   Highest education level: Not on file  Occupational History   Not on file  Tobacco Use   Smoking status: Never    Passive exposure: Never   Smokeless tobacco: Never  Vaping Use   Vaping Use: Never used  Substance and Sexual Activity   Alcohol use: Never   Drug use: Never   Sexual activity: Not Currently  Other Topics Concern   Not on file  Social History Narrative   Not on file   Social Determinants of Health   Financial Resource Strain: Not on file  Food Insecurity: Not on file  Transportation Needs: Not on file  Physical Activity: Not on file  Stress: Not on file  Social Connections: Not on file  Intimate Partner Violence: Not on file    Family History  Problem Relation Age of Onset   Colon cancer Mother    Emphysema Father    Cholelithiasis Sister    Factor V Leiden deficiency Sister    Thyroid disease Sister    Cholelithiasis Sister    Pulmonary embolism Sister    Deep vein thrombosis  Brother    Liver cancer Brother    Stroke Brother        died age 48 05/21/2021, D-Day veteran   Cancer Brother        oral cancer   Breast cancer Maternal Uncle      Current Outpatient Medications:    acetaminophen (TYLENOL) 500 MG tablet, Take 500 mg by mouth at bedtime as needed for mild pain, fever or headache., Disp: , Rfl:    Cephalexin 250 MG tablet, Take 0.5 tablets ('125mg'$ ) daily for UTI prevention., Disp: 15 tablet, Rfl: 8   Cholecalciferol 50 MCG (2000 UT) CAPS, Take by mouth., Disp: , Rfl:    estradiol (ESTRACE) 0.1 MG/GM vaginal cream, Estrogen Cream Instruction Discard applicator Apply pea sized amount to tip of finger to urethra before bed. Wash hands well after application. Use Monday, Wednesday and Friday, Disp: 42.5 g, Rfl: 3   famotidine (PEPCID) 40 MG tablet, Take 40 mg by mouth 2 (two) times daily., Disp: , Rfl:    folic acid (FOLVITE) 1 MG tablet, Take 1 mg by mouth daily., Disp: , Rfl:    mirtazapine (REMERON) 15 MG tablet,  Take 1 tablet by mouth at bedtime., Disp: , Rfl:    ondansetron (ZOFRAN-ODT) 4 MG disintegrating tablet, Take 1 tablet (4 mg total) by mouth every 8 (eight) hours as needed., Disp: 45 tablet, Rfl: 1   polyethylene glycol (MIRALAX / GLYCOLAX) 17 g packet, Take 17 g by mouth daily., Disp: , Rfl:    XARELTO 20 MG TABS tablet, Take 20 mg by mouth daily., Disp: , Rfl:   Physical exam:  Vitals:   12/15/22 1137  BP: 139/85  Pulse: 76  Resp: 16  Temp: (!) 97 F (36.1 C)  TempSrc: Tympanic  SpO2: 99%  Weight: 89 lb (40.4 kg)   Physical Exam Constitutional:      Comments: Thin elderly frail woman in no acute distress.  Kyphotic spine chronic  Cardiovascular:     Rate and Rhythm: Normal rate and regular rhythm.     Heart sounds: Normal heart sounds.  Pulmonary:     Effort: Pulmonary effort is normal.     Breath sounds: Normal breath sounds.  Abdominal:     General: Bowel sounds are normal.     Palpations: Abdomen is soft.  Skin:    General:  Skin is warm and dry.  Neurological:     Mental Status: She is alert and oriented to person, place, and time.         Latest Ref Rng & Units 11/14/2022   10:50 AM  CMP  Creatinine 0.44 - 1.00 mg/dL 0.70       Latest Ref Rng & Units 12/15/2022   10:59 AM  CBC  WBC 4.0 - 10.5 K/uL 5.2   Hemoglobin 12.0 - 15.0 g/dL 12.9   Hematocrit 36.0 - 46.0 % 40.3   Platelets 150 - 400 K/uL 203      Assessment and plan- Patient is a 89 y.o. female who is here for follow-up of following issues:  History of stage III colon cancer in April 2022 s/p surgery: I have reviewed CT chest abdomen and pelvis images independently and discussed findings with the patient which does not show any evidence of recurrent or progressive disease.  Patient wishes to monitor her colon cancer with scans every 6 months despite her age.  She understands that if metastatic disease is ever found on the scan she would not be a candidate for any systemic treatment.  I will see her back in 6 months with CBC with differential CMP CEA and scans in 6 months  History of iron deficiency anemia:Patient is not presently anemic and iron studies are currently pending.  Repeat labs in 6 months   Visit Diagnosis 1. Malignant neoplasm of ascending colon ([place])   2. Iron deficiency anemia, unspecified iron deficiency anemia type      Dr. Randa Evens, MD, MPH [facility] [phone] 12/15/2022 1:22 PM

## 2022-12-16 LAB — CEA: CEA: 1.9 ng/mL (ref 0.0–4.7)

## 2023-01-27 ENCOUNTER — Ambulatory Visit (INDEPENDENT_AMBULATORY_CARE_PROVIDER_SITE_OTHER): Payer: Medicare Other | Admitting: Physician Assistant

## 2023-01-27 VITALS — Ht 59.0 in | Wt 90.0 lb

## 2023-01-27 DIAGNOSIS — R351 Nocturia: Secondary | ICD-10-CM | POA: Diagnosis not present

## 2023-01-27 DIAGNOSIS — R8281 Pyuria: Secondary | ICD-10-CM

## 2023-01-27 DIAGNOSIS — N3941 Urge incontinence: Secondary | ICD-10-CM

## 2023-01-27 DIAGNOSIS — R350 Frequency of micturition: Secondary | ICD-10-CM

## 2023-01-27 DIAGNOSIS — R82998 Other abnormal findings in urine: Secondary | ICD-10-CM | POA: Diagnosis not present

## 2023-01-27 DIAGNOSIS — N390 Urinary tract infection, site not specified: Secondary | ICD-10-CM

## 2023-01-27 DIAGNOSIS — R8271 Bacteriuria: Secondary | ICD-10-CM

## 2023-01-27 LAB — URINALYSIS, COMPLETE
Bilirubin, UA: NEGATIVE
Glucose, UA: NEGATIVE
Ketones, UA: NEGATIVE
Nitrite, UA: NEGATIVE
Protein,UA: NEGATIVE
RBC, UA: NEGATIVE
Specific Gravity, UA: 1.015 (ref 1.005–1.030)
Urobilinogen, Ur: 0.2 mg/dL (ref 0.2–1.0)
pH, UA: 7 (ref 5.0–7.5)

## 2023-01-27 LAB — MICROSCOPIC EXAMINATION

## 2023-01-27 LAB — BLADDER SCAN AMB NON-IMAGING: Scan Result: 16

## 2023-01-27 MED ORDER — SULFAMETHOXAZOLE-TRIMETHOPRIM 800-160 MG PO TABS: 1.0000 | ORAL_TABLET | Freq: Two times a day (BID) | ORAL | 0 refills | Status: AC

## 2023-01-27 NOTE — Progress Notes (Signed)
01/27/2023 3:46 PM   Kristina Rowe 1930-10-16 [id number]  CC: Chief Complaint  Patient presents with   Follow-up   HPI: Kristina Rowe is a 89 y.o. female with PMH OAB wet not on pharmacotherapy, recurrent UTI on topical vaginal estrogen cream and Keflex 125 mg daily, chronic nausea, microscopic hematuria, and chronic PE on Xarelto who presents today for evaluation of possible UTI.   Today she reports acute worsening in urgency, frequency, urge incontinence, and nocturia over the past week.  She denies fever, chills, nausea, or vomiting.  In-office UA today positive for trace leukocytes; urine microscopy with 11-30 WBCs/HPF and moderate bacteria. PVR 51m.  PMH: Past Medical History:  Diagnosis Date   Ductal carcinoma in situ (DCIS) of left breast    Dysphagia    Esophageal stricture    Gastroparesis    GERD (gastroesophageal reflux disease)    Hiatal hernia    Hypertension    Iron deficiency anemia    Osteoporosis    Pulmonary embolism ([place]    Scoliosis    UTI (urinary tract infection)     Surgical History: Past Surgical History:  Procedure Laterality Date   BREAST LUMPECTOMY Left 2009   Ductal Carcinoma insitu    CATARACT EXTRACTION, BILATERAL     COLONOSCOPY N/A 03/17/2021   Procedure: COLONOSCOPY;  Surgeon: LLesly Rubenstein MD;  Location: [facility];  Service: Endoscopy;  Laterality: N/A;   COLOSTOMY REVISION Right 03/18/2021   Procedure: COLON RESECTION RIGHT;  Surgeon: POlean Ree MD;  Location: [facility] OR[facility];  Service: General;  Laterality: Right;   LAPAROSCOPIC PARAESOPHAGEAL HERNIA REPAIR  2009   LAPAROTOMY N/A 03/10/2021   Procedure: EXPLORATORY LAPAROTOMY;  Surgeon: POlean Ree MD;  Location: [facility] ORS;  Service: General;  Laterality: N/A;   TUBAL LIGATION      Home Medications:  Allergies as of 01/27/2023       Reactions   Metronidazole Other (See Comments)   Other reaction(s): Other Mouth sores Mouth sores Mouth sores Other  reaction(s): Other (See Comments) Mouth sores Other reaction(s): Other Mouth sores   Omeprazole Other (See Comments)   Other reaction(s): Arthralgias (intolerance) Other reaction(s): Other Other reaction(s): Arthralgias (intolerance), Other (See Comments) Other reaction(s): Arthralgias (intolerance) Other reaction(s): Other   Bactrim [sulfamethoxazole-trimethoprim] Nausea Only   Codeine Nausea And Vomiting, Nausea Only   Other reaction(s): Nausea Only But tolerates tramadol But tolerates tramadol Other reaction(s): Nausea Only But tolerates tramadol   Phenergan [promethazine]    "Drowsy"        Medication List        Accurate as of January 27, 2023  3:46 PM. If you have any questions, ask your nurse or doctor.          acetaminophen 500 MG tablet Commonly known as: TYLENOL Take 500 mg by mouth at bedtime as needed for mild pain, fever or headache.   Cephalexin 250 MG tablet Take 0.5 tablets ('125mg'$ ) daily for UTI prevention.   Cholecalciferol 50 MCG (2000 UT) Caps Take by mouth.   estradiol 0.1 MG/GM vaginal cream Commonly known as: ESTRACE Estrogen Cream Instruction Discard applicator Apply pea sized amount to tip of finger to urethra before bed. Wash hands well after application. Use Monday, Wednesday and Friday   famotidine 40 MG tablet Commonly known as: PEPCID Take 40 mg by mouth 2 (two) times daily.   folic acid 1 MG tablet Commonly known as: FOLVITE Take 1 mg by mouth daily.   mirtazapine 15 MG tablet Commonly  known as: REMERON Take 1 tablet by mouth at bedtime.   ondansetron 4 MG disintegrating tablet Commonly known as: ZOFRAN-ODT Take 1 tablet (4 mg total) by mouth every 8 (eight) hours as needed.   polyethylene glycol 17 g packet Commonly known as: MIRALAX / GLYCOLAX Take 17 g by mouth daily.   sulfamethoxazole-trimethoprim 800-160 MG tablet Commonly known as: BACTRIM DS Take 1 tablet by mouth 2 (two) times daily for 5 days.   Xarelto 20 MG  Tabs tablet Generic drug: rivaroxaban Take 20 mg by mouth daily.        Allergies:  Allergies  Allergen Reactions   Metronidazole Other (See Comments)    Other reaction(s): Other Mouth sores Mouth sores Mouth sores  Other reaction(s): Other (See Comments) Mouth sores Other reaction(s): Other  Mouth sores    Omeprazole Other (See Comments)    Other reaction(s): Arthralgias (intolerance) Other reaction(s): Other  Other reaction(s): Arthralgias (intolerance), Other (See Comments) Other reaction(s): Arthralgias (intolerance)  Other reaction(s): Other    Bactrim [Sulfamethoxazole-Trimethoprim] Nausea Only   Codeine Nausea And Vomiting and Nausea Only    Other reaction(s): Nausea Only But tolerates tramadol  But tolerates tramadol Other reaction(s): Nausea Only  But tolerates tramadol    Phenergan [Promethazine]     "Drowsy"    Family History: Family History  Problem Relation Age of Onset   Colon cancer Mother    Emphysema Father    Cholelithiasis Sister    Factor V Leiden deficiency Sister    Thyroid disease Sister    Cholelithiasis Sister    Pulmonary embolism Sister    Deep vein thrombosis Brother    Liver cancer Brother    Stroke Brother        died age 63 05-31-2021, [employer]Day veteran   Cancer Brother        oral cancer   Breast cancer Maternal Uncle     Social History:   reports that she has never smoked. She has never been exposed to tobacco smoke. She has never used smokeless tobacco. She reports that she does not drink alcohol and does not use drugs.  Physical Exam: Ht '4\' 11"'$  (1.499 m)   Wt 90 lb (40.8 kg)   BMI 18.18 kg/m   Constitutional:  Alert and oriented, no acute distress, nontoxic appearing HEENT: [place], AT Cardiovascular: No clubbing, cyanosis, or edema Respiratory: Normal respiratory effort, no increased work of breathing Skin: No rashes, bruises or suspicious lesions Neurologic: Grossly intact, no focal deficits, moving all 4  extremities Psychiatric: Normal mood and affect  Laboratory Data: Results for orders placed or performed in visit on 01/27/23  Microscopic Examination   Urine  Result Value Ref Range   WBC, UA 11-30 (A) 0 - 5 /hpf   RBC, Urine 0-2 0 - 2 /hpf   Epithelial Cells (non renal) 0-10 0 - 10 /hpf   Renal Epithel, UA 0-10 (A) None seen /hpf   Bacteria, UA Moderate (A) None seen/Few  Urinalysis, Complete  Result Value Ref Range   Specific Gravity, UA 1.015 1.005 - 1.030   pH, UA 7.0 5.0 - 7.5   Color, UA Yellow Yellow   Appearance Ur Clear Clear   Leukocytes,UA Trace (A) Negative   Protein,UA Negative Negative/Trace   Glucose, UA Negative Negative   Ketones, UA Negative Negative   RBC, UA Negative Negative   Bilirubin, UA Negative Negative   Urobilinogen, Ur 0.2 0.2 - 1.0 mg/dL   Nitrite, UA Negative Negative   Microscopic  Examination See below:   BLADDER SCAN AMB NON-IMAGING  Result Value Ref Range   Scan Result 16 ml    Assessment & Plan:   1. Recurrent UTI UA today with pyuria and bacteriuria despite suppressive Keflex with acute worsening and overactivity symptoms.  Will start empiric Bactrim and send for culture for further evaluation.  We discussed stopping Keflex while on treatment course of antibiotics and resuming it upon completion.  She expressed understanding. - Urinalysis, Complete - BLADDER SCAN AMB NON-IMAGING - CULTURE, URINE COMPREHENSIVE - sulfamethoxazole-trimethoprim (BACTRIM DS) 800-160 MG tablet; Take 1 tablet by mouth 2 (two) times daily for 5 days.  Dispense: 10 tablet; Refill: 0   Return if symptoms worsen or fail to improve.  Debroah Loop, PA-C  [facility] [address], [place], [place] [address] [phone]

## 2023-01-31 LAB — CULTURE, URINE COMPREHENSIVE

## 2023-02-02 ENCOUNTER — Telehealth: Payer: Self-pay

## 2023-02-02 NOTE — Telephone Encounter (Signed)
Called received via triage line [phone]  Pt states she was seen by SV on Friday. She would like her u/c results.   Pls advise.

## 2023-02-02 NOTE — Telephone Encounter (Signed)
Her urine culture grew mixed urogenital flora, essentially a negative result. I suspect this is due to her suppressive Keflex; I do think she was infected when I saw her, so she should complete antibiotics as prescribed.

## 2023-02-03 NOTE — Telephone Encounter (Signed)
Notified patient as instructed, patient pleased °

## 2023-03-13 ENCOUNTER — Encounter: Payer: Self-pay | Admitting: Surgery

## 2023-03-13 ENCOUNTER — Ambulatory Visit (INDEPENDENT_AMBULATORY_CARE_PROVIDER_SITE_OTHER): Payer: Medicare Other | Admitting: Surgery

## 2023-03-13 VITALS — BP 134/86 | HR 78 | Temp 98.0°F | Ht 59.0 in | Wt 87.6 lb

## 2023-03-13 DIAGNOSIS — K562 Volvulus: Secondary | ICD-10-CM

## 2023-03-13 DIAGNOSIS — Z09 Encounter for follow-up examination after completed treatment for conditions other than malignant neoplasm: Secondary | ICD-10-CM

## 2023-03-13 DIAGNOSIS — C182 Malignant neoplasm of ascending colon: Secondary | ICD-10-CM

## 2023-03-13 NOTE — Progress Notes (Signed)
03/13/2023  History of Present Illness: Kristina Rowe is a 89 y.o. female s/p open right colectomy for T3N1c colon cancer on 03/18/21.  She presents for follow up.  She had her last CT scan on 11/14/22, which I have personally viewed, showing no evidence of metastatic disease.  Her last CEA from 12/15/22 was 1.9.  She reports that she's doing well.  Denies any abdominal pain, nausea.  Reports some occasional constipation which she controls with prune juice or miralax.  Denies any blood in her stool.  She has a decent appetite, and stable weight.  Past Medical History: Past Medical History:  Diagnosis Date   Ductal carcinoma in situ (DCIS) of left breast    Dysphagia    Esophageal stricture    Gastroparesis    GERD (gastroesophageal reflux disease)    Hiatal hernia    Hypertension    Iron deficiency anemia    Osteoporosis    Pulmonary embolism    Scoliosis    UTI (urinary tract infection)      Past Surgical History: Past Surgical History:  Procedure Laterality Date   BREAST LUMPECTOMY Left 2009   Ductal Carcinoma insitu    CATARACT EXTRACTION, BILATERAL     COLONOSCOPY N/A 03/17/2021   Procedure: COLONOSCOPY;  Surgeon: Regis Bill, MD;  Location: [facility];  Service: Endoscopy;  Laterality: N/A;   COLOSTOMY REVISION Right 03/18/2021   Procedure: COLON RESECTION RIGHT;  Surgeon: Henrene Dodge, MD;  Location: [facility] ORS;  Service: General;  Laterality: Right;   LAPAROSCOPIC PARAESOPHAGEAL HERNIA REPAIR  2009   LAPAROTOMY N/A 03/10/2021   Procedure: EXPLORATORY LAPAROTOMY;  Surgeon: Henrene Dodge, MD;  Location: [facility] ORS;  Service: General;  Laterality: N/A;   TUBAL LIGATION      Home Medications: Prior to Admission medications   Medication Sig Start Date End Date Taking? Authorizing Provider  acetaminophen (TYLENOL) 500 MG tablet Take 500 mg by mouth at bedtime as needed for mild pain, fever or headache.   Yes [provider]  Cholecalciferol 50 MCG (2000 UT)  CAPS Take by mouth.   Yes [provider]  estradiol (ESTRACE) 0.1 MG/GM vaginal cream Estrogen Cream Instruction Discard applicator Apply pea sized amount to tip of finger to urethra before bed. Wash hands well after application. Use Monday, Wednesday and Friday 03/04/21  Yes Sondra Come, MD  famotidine (PEPCID) 40 MG tablet Take 40 mg by mouth 2 (two) times daily. 06/15/22  Yes [provider]  folic acid (FOLVITE) 1 MG tablet Take 1 mg by mouth daily. 01/17/19  Yes [provider]  ondansetron (ZOFRAN-ODT) 4 MG disintegrating tablet Take 1 tablet (4 mg total) by mouth every 8 (eight) hours as needed. 08/04/22  Yes Borders, Daryl Eastern, NP  polyethylene glycol (MIRALAX / GLYCOLAX) 17 g packet Take 17 g by mouth daily.   Yes [provider]  warfarin (COUMADIN) 2.5 MG tablet On Sundays and Thursdays resume the 2.5 mg dose.  All other days take 5 mg. Patient taking differently: On Sundays Tuesdays and  Thursdays resume the 2.5 mg dose.  All other days take 5 mg. 03/14/21  Yes Campbell Lerner, MD  warfarin (COUMADIN) 5 MG tablet Take by mouth. Patient taking differently: On Sundays Tuesdays and Thursdays resume the 2.5 mg dose. All other days take 5 mg. 03/26/21  Yes [provider]    Allergies: Allergies  Allergen Reactions   Metronidazole Other (See Comments)    Other reaction(s): Other Mouth sores Mouth  sores Mouth sores  Other reaction(s): Other (See Comments) Mouth sores Other reaction(s): Other  Mouth sores    Omeprazole Other (See Comments)    Other reaction(s): Arthralgias (intolerance) Other reaction(s): Other  Other reaction(s): Arthralgias (intolerance), Other (See Comments) Other reaction(s): Arthralgias (intolerance)  Other reaction(s): Other    Bactrim [Sulfamethoxazole-Trimethoprim] Nausea Only   Codeine Nausea And Vomiting and Nausea Only    Other reaction(s): Nausea Only But tolerates tramadol  But tolerates  tramadol Other reaction(s): Nausea Only  But tolerates tramadol    Phenergan [Promethazine]     "Drowsy"    Review of Systems: Review of Systems  Constitutional:  Negative for chills and fever.  Respiratory:  Negative for shortness of breath.   Cardiovascular:  Negative for chest pain.  Gastrointestinal:  Positive for constipation. Negative for abdominal pain, nausea and vomiting.  Skin:  Negative for rash.    Physical Exam BP 134/86   Pulse 78   Temp 98 F (36.7 C)   Ht 4\' 11"  (1.499 m)   Wt 87 lb 9.6 oz (39.7 kg)   SpO2 96%   BMI 17.69 kg/m  CONSTITUTIONAL: No acute distress HEENT:  Normocephalic, atraumatic, extraocular motion intact. RESPIRATORY:  Lungs are clear, and breath sounds are equal bilaterally. Normal respiratory effort without pathologic use of accessory muscles. CARDIOVASCULAR: Regular rhythm and rate with a systolic murmur GI: The abdomen is soft, non-distended, non-tender to palpation.  Midline incision well healed, without any skin excoriation, and without any hernia. MUSCULOSKELETAL:  Significant kyphosis of the back, no peripheral edema. PSYCH:  Alert and oriented to person, place and time. Affect is normal.  Labs/Imaging: CT chest, abdomen, pelvis on 11/14/22: IMPRESSION: 1. No signs of metastatic disease in the chest, abdomen or pelvis post RIGHT hemicolectomy. 2. Markedly patulous esophagus in the setting of hiatal hernia with unchanged appearance. Correlate with any symptoms of gastroesophageal reflux. 3. Large duodenal diverticula as on previous imaging. 4. Marked rotary levoconvex scoliotic curvature at the thoracolumbar junction. 5. Aortic atherosclerosis and coronary artery disease.  CEA on 12/15/22: 1.9  Assessment and Plan: This is a 89 y.o. female s/p right colectomy for colon cancer.  --Patient is doing well without any suspicious symptoms or findings on her imaging.  She uses a cane for ambulation.  Her appetite and weight are  stable and denies any significant abdominal issues.  --Patient will follow up with me in 1 year.  I spent 20 minutes dedicated to the care of this patient on the date of this encounter to include pre-visit review of records, face-to-face time with the patient discussing diagnosis and management, and any post-visit coordination of care.   Howie Ill, MD [facility]

## 2023-03-13 NOTE — Patient Instructions (Signed)
Follow-up with our office in one year. We will send you a letter about this appointment.   Please call and ask to speak with a nurse if you develop questions or concerns.

## 2023-06-08 ENCOUNTER — Telehealth: Payer: Self-pay | Admitting: Physician Assistant

## 2023-06-08 NOTE — Telephone Encounter (Signed)
Patient called and stated that she has seen her primary doctor (Dr. Nemiah Commander with [facility]), and discussed new symptom of urine leakage she is having. She said that Dr.Kalisetti recommended that she ask our office about trying samples of Gemtesa. Could she try samples, or does she need to be seen for an appointment first?

## 2023-06-09 NOTE — Telephone Encounter (Signed)
LVM for pt to return call

## 2023-06-09 NOTE — Telephone Encounter (Signed)
2nd attempt to reach pt via phone call, unable to leave a message

## 2023-06-09 NOTE — Telephone Encounter (Signed)
I have seen her before for her OAB.  Okay to give her 6 weeks of Gemtesa samples and schedule her for office visit follow-up with me for symptom recheck and PVR in 6 weeks.

## 2023-06-12 ENCOUNTER — Ambulatory Visit
Admission: RE | Admit: 2023-06-12 | Discharge: 2023-06-12 | Disposition: A | Discharge status: Home / Self Care | Payer: Medicare Other | Source: Ambulatory Visit | Attending: Oncology | Admitting: Oncology

## 2023-06-12 DIAGNOSIS — C182 Malignant neoplasm of ascending colon: Secondary | ICD-10-CM | POA: Insufficient documentation

## 2023-06-12 MED ORDER — BARIUM SULFATE 2 % PO SUSP
450.0000 mL | ORAL | Status: AC
  Administered 2023-06-12 (×2): 450 mL via ORAL

## 2023-06-12 MED ORDER — IOHEXOL 300 MG/ML  SOLN
75.0000 mL | Freq: Once | INTRAMUSCULAR | Status: AC | PRN
  Administered 2023-06-12: 75 mL via INTRAVENOUS

## 2023-06-13 NOTE — Telephone Encounter (Signed)
Pt informed, voiced understanding

## 2023-06-15 ENCOUNTER — Other Ambulatory Visit: Payer: Self-pay

## 2023-06-15 DIAGNOSIS — N3281 Overactive bladder: Secondary | ICD-10-CM

## 2023-06-15 MED ORDER — GEMTESA 75 MG PO TABS
75.0000 mg | ORAL_TABLET | Freq: Every day | ORAL | 0 refills | Status: DC
Start: 2023-06-15 — End: 2023-11-27

## 2023-06-16 ENCOUNTER — Inpatient Hospital Stay: Payer: Medicare Other

## 2023-06-16 ENCOUNTER — Inpatient Hospital Stay: Payer: Medicare Other | Admitting: Oncology

## 2023-06-19 ENCOUNTER — Inpatient Hospital Stay: Payer: Medicare Other | Admitting: Oncology

## 2023-06-19 ENCOUNTER — Inpatient Hospital Stay: Payer: Medicare Other | Attending: Oncology

## 2023-06-19 ENCOUNTER — Encounter: Payer: Self-pay | Admitting: Oncology

## 2023-06-19 VITALS — BP 137/78 | HR 74 | Temp 97.3°F | Resp 16 | Wt 88.0 lb

## 2023-06-19 DIAGNOSIS — Z803 Family history of malignant neoplasm of breast: Secondary | ICD-10-CM | POA: Insufficient documentation

## 2023-06-19 DIAGNOSIS — C182 Malignant neoplasm of ascending colon: Secondary | ICD-10-CM | POA: Insufficient documentation

## 2023-06-19 DIAGNOSIS — D508 Other iron deficiency anemias: Secondary | ICD-10-CM

## 2023-06-19 DIAGNOSIS — D7589 Other specified diseases of blood and blood-forming organs: Secondary | ICD-10-CM | POA: Diagnosis not present

## 2023-06-19 DIAGNOSIS — D509 Iron deficiency anemia, unspecified: Secondary | ICD-10-CM

## 2023-06-19 DIAGNOSIS — Z85038 Personal history of other malignant neoplasm of large intestine: Secondary | ICD-10-CM | POA: Diagnosis not present

## 2023-06-19 DIAGNOSIS — Z800 Family history of malignant neoplasm of digestive organs: Secondary | ICD-10-CM | POA: Insufficient documentation

## 2023-06-19 DIAGNOSIS — Z08 Encounter for follow-up examination after completed treatment for malignant neoplasm: Secondary | ICD-10-CM | POA: Diagnosis not present

## 2023-06-19 LAB — CBC WITH DIFFERENTIAL/PLATELET
Abs Immature Granulocytes: 0.02 10*3/uL (ref 0.00–0.07)
Basophils Absolute: 0 10*3/uL (ref 0.0–0.1)
Basophils Relative: 1 %
Eosinophils Absolute: 0 10*3/uL (ref 0.0–0.5)
Eosinophils Relative: 0 %
HCT: 39.6 % (ref 36.0–46.0)
Hemoglobin: 12.7 g/dL (ref 12.0–15.0)
Immature Granulocytes: 0 %
Lymphocytes Relative: 20 %
Lymphs Abs: 1 10*3/uL (ref 0.7–4.0)
MCH: 32.2 pg (ref 26.0–34.0)
MCHC: 32.1 g/dL (ref 30.0–36.0)
MCV: 100.3 fL — ABNORMAL HIGH (ref 80.0–100.0)
Monocytes Absolute: 0.3 10*3/uL (ref 0.1–1.0)
Monocytes Relative: 6 %
Neutro Abs: 3.4 10*3/uL (ref 1.7–7.7)
Neutrophils Relative %: 73 %
Platelets: 204 10*3/uL (ref 150–400)
RBC: 3.95 MIL/uL (ref 3.87–5.11)
RDW: 13.2 % (ref 11.5–15.5)
WBC: 4.8 10*3/uL (ref 4.0–10.5)
nRBC: 0 % (ref 0.0–0.2)

## 2023-06-19 LAB — FERRITIN: Ferritin: 92 ng/mL (ref 11–307)

## 2023-06-19 LAB — IRON AND TIBC
Iron: 77 ug/dL (ref 28–170)
Saturation Ratios: 25 % (ref 10.4–31.8)
TIBC: 308 ug/dL (ref 250–450)
UIBC: 231 ug/dL

## 2023-06-19 NOTE — Progress Notes (Signed)
Pt in for follow up, reports doing well, no concerns.

## 2023-06-19 NOTE — Addendum Note (Signed)
Addended by: Corene Cornea on: 06/19/2023 04:08 PM   Modules accepted: Orders

## 2023-06-19 NOTE — Progress Notes (Signed)
Hematology/Oncology Consult note [facility]  [phone] [phone]  Patient Care Team: Enid Baas, MD as PCP - General (Internal Medicine) Debbe Odea, MD as PCP - Cardiology (Cardiology) Creig Hines, MD as Consulting Physician (Hematology and Oncology)   Name of the patient: Kristina Rowe  [id number]  [birth year]   Date of visit: 06/19/23  Diagnosis-history of stage III colon cancer  Chief complaint/ Reason for visit-routine follow-up of colon cancer  Heme/Onc history: Patient is a 89 year old female who initially presented to the ER with symptoms of significant abdominal pain.  CT abdomen and pelvis on 03/10/2021 showed volvulus along with a large hiatal hernia.  Patient initially had an exploratory laparotomy with lysis of adhesion by Dr. Aleen Campi.  She then presented back to the ER with similar complaints and a repeat CT abdomen at that time showed bulky ascending colon mass compatible with carcinoma and possible local regional adenopathy.  No recurrent volvulus.  Patient underwent open right colectomy on 03/18/2021.  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  All regional lymph nodes 32 of them were negative for tumor but there was a tumor deposit present at 1 site.  PT3PN1C.   Risks versus benefits of adjuvant chemotherapy was discussed and patient was not deemed to be a candidate for adjuvant chemotherapy and she did not wish to go through chemotherapy herself.  Anemia work-up was consistent with iron deficiency and patient received 2 doses of Feraheme    Interval history-she is doing well for her age and remains independent of her ADLs and IADLs.  She is still able to drive around familiar streets.  Denies any abdominal pain  ECOG PS- 2 Pain scale- 0   Review of systems- Review of Systems  Constitutional:  Negative for chills, fever,  malaise/fatigue and weight loss.  HENT:  Negative for congestion, ear discharge and nosebleeds.   Eyes:  Negative for blurred vision.  Respiratory:  Negative for cough, hemoptysis, sputum production, shortness of breath and wheezing.   Cardiovascular:  Negative for chest pain, palpitations, orthopnea and claudication.  Gastrointestinal:  Negative for abdominal pain, blood in stool, constipation, diarrhea, heartburn, melena, nausea and vomiting.  Genitourinary:  Negative for dysuria, flank pain, frequency, hematuria and urgency.  Musculoskeletal:  Negative for back pain, joint pain and myalgias.  Skin:  Negative for rash.  Neurological:  Negative for dizziness, tingling, focal weakness, seizures, weakness and headaches.  Endo/Heme/Allergies:  Does not bruise/bleed easily.  Psychiatric/Behavioral:  Negative for depression and suicidal ideas. The patient does not have insomnia.       Allergies  Allergen Reactions   Metronidazole Other (See Comments)    Other reaction(s): Other Mouth sores Mouth sores Mouth sores  Other reaction(s): Other (See Comments) Mouth sores Other reaction(s): Other  Mouth sores    Omeprazole Other (See Comments)    Other reaction(s): Arthralgias (intolerance) Other reaction(s): Other  Other reaction(s): Arthralgias (intolerance), Other (See Comments) Other reaction(s): Arthralgias (intolerance)  Other reaction(s): Other    Bactrim [Sulfamethoxazole-Trimethoprim] Nausea Only   Codeine Nausea And Vomiting and Nausea Only    Other reaction(s): Nausea Only But tolerates tramadol  But tolerates tramadol Other reaction(s): Nausea Only  But tolerates tramadol      Past Medical History:  Diagnosis Date   Ductal carcinoma in situ (DCIS) of left breast    Dysphagia    Esophageal stricture    Gastroparesis    GERD (gastroesophageal reflux  disease)    Hiatal hernia    Hypertension    Iron deficiency anemia    Osteoporosis    Pulmonary embolism  (HCC)    Scoliosis    UTI (urinary tract infection)      Past Surgical History:  Procedure Laterality Date   BREAST LUMPECTOMY Left 2009   Ductal Carcinoma insitu    CATARACT EXTRACTION, BILATERAL     COLONOSCOPY N/A 03/17/2021   Procedure: COLONOSCOPY;  Surgeon: Regis Bill, MD;  Location: [facility] ENDOSCOPY;  Service: Endoscopy;  Laterality: N/A;   COLOSTOMY REVISION Right 03/18/2021   Procedure: COLON RESECTION RIGHT;  Surgeon: Henrene Dodge, MD;  Location: [facility] ORS;  Service: General;  Laterality: Right;   LAPAROSCOPIC PARAESOPHAGEAL HERNIA REPAIR  2009   LAPAROTOMY N/A 03/10/2021   Procedure: EXPLORATORY LAPAROTOMY;  Surgeon: Henrene Dodge, MD;  Location: [facility] ORS;  Service: General;  Laterality: N/A;   TUBAL LIGATION      Social History   Socioeconomic History   Marital status: Married    Spouse name: Not on file   Number of children: Not on file   Years of education: Not on file   Highest education level: Not on file  Occupational History   Not on file  Tobacco Use   Smoking status: Never    Passive exposure: Never   Smokeless tobacco: Never  Vaping Use   Vaping status: Never Used  Substance and Sexual Activity   Alcohol use: Never   Drug use: Never   Sexual activity: Not Currently  Other Topics Concern   Not on file  Social History Narrative   Not on file   Social Determinants of Health   Financial Resource Strain: Not on file  Food Insecurity: Not on file  Transportation Needs: Not on file  Physical Activity: Not on file  Stress: Not on file  Social Connections: Not on file  Intimate Partner Violence: Not on file    Family History  Problem Relation Age of Onset   Colon cancer Mother    Emphysema Father    Cholelithiasis Sister    Factor V Leiden deficiency Sister    Thyroid disease Sister    Cholelithiasis Sister    Pulmonary embolism Sister    Deep vein thrombosis Brother    Liver cancer Brother    Stroke Brother        died age 87-06-20 2022, D-Day veteran   Cancer Brother        oral cancer   Breast cancer Maternal Uncle      Current Outpatient Medications:    acetaminophen (TYLENOL) 500 MG tablet, Take 500 mg by mouth at bedtime as needed for mild pain, fever or headache., Disp: , Rfl:    Cephalexin 250 MG tablet, Take 0.5 tablets (125mg ) daily for UTI prevention., Disp: 15 tablet, Rfl: 8   Cholecalciferol 50 MCG (2000 UT) CAPS, Take by mouth., Disp: , Rfl:    estradiol (ESTRACE) 0.1 MG/GM vaginal cream, Estrogen Cream Instruction Discard applicator Apply pea sized amount to tip of finger to urethra before bed. Wash hands well after application. Use Monday, Wednesday and Friday, Disp: 42.5 g, Rfl: 3   famotidine (PEPCID) 40 MG tablet, Take 40 mg by mouth 2 (two) times daily., Disp: , Rfl:    folic acid (FOLVITE) 1 MG tablet, Take 1 mg by mouth daily., Disp: , Rfl:    ondansetron (ZOFRAN-ODT) 4 MG disintegrating tablet, Take 1 tablet (4 mg total) by mouth every 8 (eight)  hours as needed., Disp: 45 tablet, Rfl: 1   polyethylene glycol (MIRALAX / GLYCOLAX) 17 g packet, Take 17 g by mouth daily., Disp: , Rfl:    rivaroxaban (XARELTO) 10 MG TABS tablet, Take 10 mg by mouth daily., Disp: , Rfl:    Vibegron (GEMTESA) 75 MG TABS, Take 1 tablet (75 mg total) by mouth daily. (Patient not taking: Reported on 06/19/2023), Disp: 28 tablet, Rfl: 0  Physical exam:  Vitals:   06/19/23 1450  BP: 137/78  Pulse: 74  Resp: 16  Temp: (!) 97.3 F (36.3 C)  TempSrc: Tympanic  Weight: 88 lb (39.9 kg)   Physical Exam Cardiovascular:     Rate and Rhythm: Normal rate and regular rhythm.     Heart sounds: Normal heart sounds.  Pulmonary:     Effort: Pulmonary effort is normal.  Skin:    General: Skin is warm and dry.  Neurological:     Mental Status: She is alert and oriented to person, place, and time.         Latest Ref Rng & Units 11/14/2022   10:50 AM  CMP  Creatinine 0.44 - 1.00 mg/dL 3.08       Latest Ref Rng & Units  06/19/2023    1:59 PM  CBC  WBC 4.0 - 10.5 K/uL 4.8   Hemoglobin 12.0 - 15.0 g/dL 65.7   Hematocrit 84.6 - 46.0 % 39.6   Platelets 150 - 400 K/uL 204     No images are attached to the encounter.  CT CHEST ABDOMEN PELVIS W CONTRAST  Result Date: 06/12/2023 CLINICAL DATA:  Stage III colon cancer. Diagnosed in 2022. Partial colon resection. Asymptomatic. Left breast cancer in 2018. * Tracking Code: BO * EXAM: CT CHEST, ABDOMEN, AND PELVIS WITH CONTRAST TECHNIQUE: Multidetector CT imaging of the chest, abdomen and pelvis was performed following the standard protocol during bolus administration of intravenous contrast. RADIATION DOSE REDUCTION: This exam was performed according to the departmental dose-optimization program which includes automated exposure control, adjustment of the mA and/or kV according to patient size and/or use of iterative reconstruction technique. CONTRAST:  75mL OMNIPAQUE IOHEXOL 300 MG/ML  SOLN COMPARISON:  11/14/2022 FINDINGS: CT CHEST FINDINGS Cardiovascular: Aortic atherosclerosis. Tortuous thoracic aorta. Normal heart size, without pericardial effusion. Multivessel coronary artery atherosclerosis. No central pulmonary embolism, on this non-dedicated study. Mediastinum/Nodes: No mediastinal or hilar adenopathy. Again identified is a moderately dilated esophagus which is contrast filled. Lungs/Pleura: No pleural fluid.  Right hemidiaphragm elevation. Minimal right middle lobe nodularity on 99/3 is favored to represent mucoid impaction and is similar to slightly increased since the prior. Minimal subpleural nodularity in the anterior left lower lobe on 79/3 is unchanged, favoring pleural thickening or a subpleural lymph node. Musculoskeletal: No acute osseous abnormality. CT ABDOMEN PELVIS FINDINGS Hepatobiliary: Normal liver. Normal gallbladder, without biliary ductal dilatation. Pancreas: Normal, without mass or ductal dilatation. Spleen: Normal in size, without focal abnormality.  Adrenals/Urinary Tract: Normal adrenal glands. Too small to characterize upper pole right renal lesion is most likely a cyst . In the absence of clinically indicated signs/symptoms require(s) no independent follow-up. No hydronephrosis. Normal left kidney. Normal urinary bladder. Stomach/Bowel: Normal stomach, without wall thickening. Scattered colonic diverticula. Right hemicolectomy. Normal small bowel. Vascular/Lymphatic: Aortic atherosclerosis. Paucity of abdominopelvic fat degrades evaluation for adenopathy. No gross adenopathy identified. Reproductive: Normal uterus and adnexa. Other: No significant free fluid. Small left groin (favor femoral) hernia contains fluid on 101/2 and is new. Musculoskeletal: Marked S shaped thoracolumbar spine curvature.  Accentuation of expected thoracic kyphosis. IMPRESSION: 1. Status post right hemicolectomy, without recurrent or metastatic disease. Decreased sensitivity exam, primarily involving the abdomen pelvis, due to paucity of fat. 2. New tiny left groin hernia containing fluid. Consider physical exam correlation. 3. Incidental findings, including: Coronary artery atherosclerosis. Aortic Atherosclerosis (ICD10-I70.0). Dilated esophagus with contrast throughout, suggesting dysmotility and/or gastroesophageal reflux. Electronically Signed   By: Jeronimo Greaves M.D.   On: 06/12/2023 14:04     Assessment and plan- Patient is a 89 y.o. female with history of stage III colon cancer in April 2022 s/p surgery here for routine follow-up  Clinically patient is doing well with no concerning signs and symptoms of recurrence based on today's exam.  I have reviewed CT chest abdomen and pelvis images independently and discussed findings with the patient which did not show any evidence of recurrent progressive disease.  Patient wishes to continue surveillance scans every 6 months.  I will see her back in 6 months with labs and scans prior.  Patient has evidence of macrocytosis without  anemia.  I will check B12 and folate with her next set of labs   Visit Diagnosis 1. Encounter for follow-up surveillance of colon cancer      Dr. Owens Shark, MD, MPH [facility] [phone] 06/19/2023 3:46 PM

## 2023-06-20 LAB — CEA: CEA: 2 ng/mL (ref 0.0–4.7)

## 2023-06-30 ENCOUNTER — Ambulatory Visit: Payer: Medicare Other | Admitting: Cardiology

## 2023-07-24 ENCOUNTER — Ambulatory Visit: Payer: Medicare Other | Admitting: Physician Assistant

## 2023-07-26 ENCOUNTER — Ambulatory Visit: Payer: Medicare Other | Admitting: Physical Therapy

## 2023-08-03 ENCOUNTER — Ambulatory Visit: Payer: Medicare Other | Admitting: Physical Therapy

## 2023-08-09 ENCOUNTER — Ambulatory Visit: Payer: Medicare Other | Admitting: Physical Therapy

## 2023-08-15 ENCOUNTER — Other Ambulatory Visit: Payer: Self-pay

## 2023-08-15 ENCOUNTER — Encounter: Payer: Self-pay | Admitting: Physical Therapy

## 2023-08-15 ENCOUNTER — Ambulatory Visit: Payer: Medicare Other | Attending: Physician Assistant | Admitting: Physical Therapy

## 2023-08-15 DIAGNOSIS — R2681 Unsteadiness on feet: Secondary | ICD-10-CM | POA: Diagnosis present

## 2023-08-15 DIAGNOSIS — R2689 Other abnormalities of gait and mobility: Secondary | ICD-10-CM | POA: Diagnosis present

## 2023-08-15 DIAGNOSIS — R262 Difficulty in walking, not elsewhere classified: Secondary | ICD-10-CM | POA: Diagnosis present

## 2023-08-15 DIAGNOSIS — M6281 Muscle weakness (generalized): Secondary | ICD-10-CM | POA: Insufficient documentation

## 2023-08-15 NOTE — Therapy (Signed)
OUTPATIENT PHYSICAL THERAPY NEURO EVALUATION   Patient Name: Kristina Rowe MRN: [id number] DOB:1930-02-27, 89 y.o., female Today's Date: 08/15/2023   PCP: Enid Baas, MD  REFERRING PROVIDER:   Ignacia Bayley, PA-C    END OF SESSION:  PT End of Session - 08/15/23 1148     Visit Number 1    Number of Visits 24    Date for PT Re-Evaluation 11/07/23    PT Start Time 1145    PT Stop Time 1230    PT Time Calculation (min) 45 min    Equipment Utilized During Treatment Gait belt    Activity Tolerance Patient tolerated treatment well    Behavior During Therapy WFL for tasks assessed/performed             Past Medical History:  Diagnosis Date   Ductal carcinoma in situ (DCIS) of left breast    Dysphagia    Esophageal stricture    Gastroparesis    GERD (gastroesophageal reflux disease)    Hiatal hernia    Hypertension    Iron deficiency anemia    Osteoporosis    Pulmonary embolism (HCC)    Scoliosis    UTI (urinary tract infection)    Past Surgical History:  Procedure Laterality Date   BREAST LUMPECTOMY Left 2009   Ductal Carcinoma insitu    CATARACT EXTRACTION, BILATERAL     COLONOSCOPY N/A 03/17/2021   Procedure: COLONOSCOPY;  Surgeon: Regis Bill, MD;  Location: [facility] ENDOSCOPY;  Service: Endoscopy;  Laterality: N/A;   COLOSTOMY REVISION Right 03/18/2021   Procedure: COLON RESECTION RIGHT;  Surgeon: Henrene Dodge, MD;  Location: [facility] ORS;  Service: General;  Laterality: Right;   LAPAROSCOPIC PARAESOPHAGEAL HERNIA REPAIR  2009   LAPAROTOMY N/A 03/10/2021   Procedure: EXPLORATORY LAPAROTOMY;  Surgeon: Henrene Dodge, MD;  Location: [facility] ORS;  Service: General;  Laterality: N/A;   TUBAL LIGATION     Patient Active Problem List   Diagnosis Date Noted   Basal cell carcinoma of leg 04/01/2021   Coronary atherosclerosis 04/01/2021   History of pulmonary embolism 04/01/2021   Pulmonary nodule 04/01/2021   Malignant neoplasm of colon (HCC) 03/29/2021    Rectal bleeding 03/15/2021   Hypertension    GERD (gastroesophageal reflux disease)    Colonic mass    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HCC) 03/10/2021   Anemia, unspecified 03/06/2021   Bronchiectasis without complication (HCC) 03/27/2020   Iron deficiency anemia 03/27/2020   Moderate aortic stenosis 03/27/2020   Stage 3a chronic kidney disease (HCC) 07/04/2019   Hypnic headache 05/28/2019   Telogen effluvium 04/17/2019   Xerosis cutis 04/17/2019   Essential hypertension 01/14/2019   Cervical radiculopathy 05/04/2018   Osteoarthritis of left glenohumeral joint 05/04/2018   Vitamin B12 deficiency 01/01/2018   Vascular abnormality 09/11/2017   Dyspepsia 03/28/2017   Pulmonary embolism (HCC) 08/16/2016   Dilated pore of [place] 03/23/2015   Retinal drusen of both eyes 08/28/2014   Status post right cataract extraction 07/30/2014   Verruca 03/19/2014   Chronic back pain 11/06/2013   Postmenopausal osteoporosis 06/11/2013   Anticoagulated on Coumadin 04/06/2013   Long term (current) use of anticoagulants 10/18/2012   Preglaucoma 05/10/2012   Presbyopia 05/10/2012   Senile nuclear sclerosis 05/10/2012   Lobular carcinoma in situ of breast 04/13/2012   Closed fracture of ankle 02/27/2012   History of nonmelanoma skin cancer 09/05/2011   Other benign neoplasm of skin of trunk 09/05/2011   Osteoporosis 08/25/2011   Transient alteration of awareness 09/03/2009  Colon adenoma 07/30/2002    ONSET DATE: 3 months   REFERRING DIAG:  Diagnosis  R53.83 (ICD-10-CM) - Other fatigue    THERAPY DIAG:  Difficulty in walking, not elsewhere classified  Other abnormalities of gait and mobility  Unsteadiness on feet  Muscle weakness (generalized)  Rationale for Evaluation and Treatment: Rehabilitation  SUBJECTIVE:                                                                                                                                                                                              SUBJECTIVE STATEMENT: Pt reports that she has been limited from walking due to heat over the summer, not states that her legs are feeling weak and tired as well as SOB with physical activity. Pt is familiar with thei clinic and received PT for BLE weakness/balance and back pain  a little over a year ago per pt report  Pt accompanied by: family member  PERTINENT HISTORY:  From recent MD visit.  89 y.o. here for an acute issue. She has a PMH of chronic PE/Coumadin, anemia, colon cancer, breast cancer, large hiatal hernia, moderate aortic stenosis who has concerns about shortness of breath, nausea, generalized weakness. No chest pain. History of hiatal hernia and recent CT chest. Also recent endoscopy. Takes Zofran chronically. Has felt weaker and almost fell recently. Uses a walker. Also on chronic UTI suppression, Keflex.   PAIN:  Are you having pain? Yes: NPRS scale: 4/10 Pain location: low back  Pain description: sore  Aggravating factors: movement  Relieving factors: rest   PRECAUTIONS: Fall  RED FLAGS: None   WEIGHT BEARING RESTRICTIONS: No  FALLS: Has patient fallen in last 6 months? No  LIVING ENVIRONMENT: Lives with: lives alone Lives in: House/apartment Stairs: Yes: External: 1 steps; none Has following equipment at home: Walker - 4 wheeled  PLOF: Independent with household mobility with device and Independent with community mobility with device  PATIENT GOALS: improved strength in BLE   OBJECTIVE:   DIAGNOSTIC FINDINGS:  06/12/2023 EXAM: CT CHEST, ABDOMEN, AND PELVIS WITH CONTRAST IMPRESSION: 1. Status post right hemicolectomy, without recurrent or metastatic disease. Decreased sensitivity exam, primarily involving the abdomen pelvis, due to paucity of fat. 2. New tiny left groin hernia containing fluid. Consider physical exam correlation. 3. Incidental findings, including: Coronary artery atherosclerosis. Aortic Atherosclerosis (ICD10-I70.0). Dilated  esophagus with contrast throughout, suggesting dysmotility and/or gastroesophageal reflux.  COGNITION: Overall cognitive status: Within functional limits for tasks assessed   SENSATION: Light touch: Impaired  mild paraesthesia in the proximal RLE   COORDINATION: WFL. Ankle to knee limited ROM due to weakness in the  R hip      POSTURE: rounded shoulders, forward head, flexed trunk , and severe scoliosis     LOWER EXTREMITY MMT:    MMT Right Eval Left Eval  Hip flexion 4- 4  Hip extension    Hip abduction 4- 44  Hip adduction 4 4  Hip internal rotation    Hip external rotation    Knee flexion 4- 4  Knee extension 4 4+  Ankle dorsiflexion 4 4+  Ankle plantarflexion    Ankle inversion    Ankle eversion    (Blank rows = not tested)    TRANSFERS: Assistive device utilized: Environmental consultant - 4 wheeled  Sit to stand: SBA Stand to sit: SBA Chair to chair: SBA Floor:  Unable to perform    STAIRS: Level of Assistance:  pt declines Stair Negotiation Technique: Step to Pattern with Bilateral Rails Number of Stairs: 4  Height of Stairs: 6  Comments: will need to assess.   GAIT: Gait pattern: step through pattern, Right foot flat, and lateral hip instability Distance walked: 226ft Assistive device utilized: Walker - 4 wheeled Level of assistance: Modified independence and SBA Comments: severe scoliosis.    FUNCTIONAL TESTS:  5 times sit to stand: 30.86 Timed up and go (TUG): 29.39 2 minute walk test: 257ft  10 meter walk test: 0.64m/s, SOB 4-5.  Berg Balance Scale: TBD  PATIENT SURVEYS:  FOTO 56. goal 64  TODAY'S TREATMENT:                                                                                                                              DATE: 08/15/2023     BP taken following 2 min walk test.  157/93 HR 89    PATIENT EDUCATION: Education details: POC. Goals  Person educated: Patient Education method: Explanation Education comprehension:  verbalized understanding and returned demonstration  HOME EXERCISE PROGRAM: To be given at next session   GOALS: Goals reviewed with patient? Yes   SHORT TERM GOALS: Target date: 09/19/2023    Patient will be independent in home exercise program to improve strength/mobility for better functional independence with ADLs. Baseline:to be given at next session  Goal status: INITIAL   LONG TERM GOALS: Target date: 11/07/2023    Patient will increase FOTO score to equal to or greater than  61   to demonstrate statistically significant improvement in mobility and quality of life.  Baseline: 56 Goal status: INITIAL  2.  Patient (> 58 years old) will complete five times sit to stand test in < 15 seconds indicating an increased LE strength and improved balance. Baseline: 30.86 sec Goal status: INITIAL  3.  Patient will increase Berg Balance score by > 6 points to demonstrate decreased fall risk during functional activities  Baseline: to be completed  Goal status: INITIAL  4.  Patient will increase 10 meter walk test to >1.74m/s as to improve gait speed for better community ambulation and to reduce fall  risk. Baseline: 0.661m/s Goal status: INITIAL  5.  Patient will reduce timed up and go to <11 seconds to reduce fall risk and demonstrate improved transfer/gait ability. Baseline: 29.39 Goal status: INITIAL  6.  Patient will 2 min walk test by at least 47ft as to demonstrate reduced fall risk and improved dynamic gait balance for better safety with community/home ambulation.   Baseline: 245ft Goal status: INITIAL   ASSESSMENT: CLINICAL IMPRESSION: Patient is a 89 y.o. F who was seen today for physical therapy evaluation and treatment for BLE weakness and balance deficits. Pt have been seen by this clinic in the past for LE strengthening, LPB, and balance. Pt states that over the last few months, she has felt off balance and continued weakness through BLE. Pt demonstrates increased  fall risk with increased time on 5x STS of >30 sec, 2 min walk test of 272ft, 10 mWT 0.612m/s and Tug 29.39sec. Due to reduced strength and balance deficits revealed in Eval, pt will benefor from skilled PT to address deficits, improve, QoL, reduce fall risk and return to PLOF.   OBJECTIVE IMPAIRMENTS: Abnormal gait, cardiopulmonary status limiting activity, decreased activity tolerance, decreased balance, decreased endurance, decreased mobility, difficulty walking, decreased ROM, decreased strength, decreased safety awareness, hypomobility, increased fascial restrictions, impaired flexibility, improper body mechanics, and postural dysfunction.   ACTIVITY LIMITATIONS: lifting, standing, squatting, transfers, and locomotion level  PARTICIPATION LIMITATIONS: laundry, shopping, and community activity  PERSONAL FACTORS: 3+ comorbidities: scoliosis, hx of CA, and PE  are also affecting patient's functional outcome.   REHAB POTENTIAL: Good  CLINICAL DECISION MAKING: Stable/uncomplicated  EVALUATION COMPLEXITY: Moderate  PLAN:  PT FREQUENCY: 1-2x/week  PT DURATION: 12 weeks  PLANNED INTERVENTIONS: Therapeutic exercises, Therapeutic activity, Neuromuscular re-education, Balance training, Gait training, Patient/Family education, Self Care, Joint mobilization, Stair training, Vestibular training, DME instructions, Dry Needling, Spinal mobilization, Cryotherapy, Moist heat, and Manual therapy  PLAN FOR NEXT SESSION: complete Berg. Initiate HEP   Golden Pop, PT 08/15/2023, 11:51 AM

## 2023-08-17 ENCOUNTER — Observation Stay
Admission: EM | Admit: 2023-08-17 | Discharge: 2023-08-18 | Disposition: A | Discharge status: Home / Self Care | Payer: Medicare Other | Attending: Internal Medicine | Admitting: Internal Medicine

## 2023-08-17 ENCOUNTER — Emergency Department: Payer: Medicare Other

## 2023-08-17 ENCOUNTER — Other Ambulatory Visit: Payer: Self-pay

## 2023-08-17 DIAGNOSIS — I129 Hypertensive chronic kidney disease with stage 1 through stage 4 chronic kidney disease, or unspecified chronic kidney disease: Secondary | ICD-10-CM | POA: Diagnosis not present

## 2023-08-17 DIAGNOSIS — R2681 Unsteadiness on feet: Secondary | ICD-10-CM | POA: Diagnosis not present

## 2023-08-17 DIAGNOSIS — Z79899 Other long term (current) drug therapy: Secondary | ICD-10-CM | POA: Insufficient documentation

## 2023-08-17 DIAGNOSIS — Z86711 Personal history of pulmonary embolism: Secondary | ICD-10-CM | POA: Diagnosis not present

## 2023-08-17 DIAGNOSIS — I5033 Acute on chronic diastolic (congestive) heart failure: Secondary | ICD-10-CM | POA: Diagnosis not present

## 2023-08-17 DIAGNOSIS — Z7901 Long term (current) use of anticoagulants: Secondary | ICD-10-CM | POA: Insufficient documentation

## 2023-08-17 DIAGNOSIS — K219 Gastro-esophageal reflux disease without esophagitis: Secondary | ICD-10-CM | POA: Diagnosis present

## 2023-08-17 DIAGNOSIS — M6281 Muscle weakness (generalized): Secondary | ICD-10-CM | POA: Diagnosis not present

## 2023-08-17 DIAGNOSIS — B349 Viral infection, unspecified: Secondary | ICD-10-CM | POA: Diagnosis not present

## 2023-08-17 DIAGNOSIS — R0602 Shortness of breath: Secondary | ICD-10-CM | POA: Diagnosis present

## 2023-08-17 DIAGNOSIS — N189 Chronic kidney disease, unspecified: Secondary | ICD-10-CM | POA: Insufficient documentation

## 2023-08-17 DIAGNOSIS — J9601 Acute respiratory failure with hypoxia: Secondary | ICD-10-CM | POA: Diagnosis not present

## 2023-08-17 DIAGNOSIS — Z1152 Encounter for screening for COVID-19: Secondary | ICD-10-CM | POA: Diagnosis not present

## 2023-08-17 DIAGNOSIS — Z85038 Personal history of other malignant neoplasm of large intestine: Secondary | ICD-10-CM | POA: Diagnosis not present

## 2023-08-17 DIAGNOSIS — I5032 Chronic diastolic (congestive) heart failure: Secondary | ICD-10-CM | POA: Diagnosis not present

## 2023-08-17 DIAGNOSIS — Z933 Colostomy status: Secondary | ICD-10-CM | POA: Diagnosis not present

## 2023-08-17 DIAGNOSIS — I130 Hypertensive heart and chronic kidney disease with heart failure and stage 1 through stage 4 chronic kidney disease, or unspecified chronic kidney disease: Secondary | ICD-10-CM | POA: Insufficient documentation

## 2023-08-17 LAB — BASIC METABOLIC PANEL
Anion gap: 9 (ref 5–15)
BUN: 22 mg/dL (ref 8–23)
CO2: 33 mmol/L — ABNORMAL HIGH (ref 22–32)
Calcium: 9.8 mg/dL (ref 8.9–10.3)
Chloride: 91 mmol/L — ABNORMAL LOW (ref 98–111)
Creatinine, Ser: 0.74 mg/dL (ref 0.44–1.00)
GFR, Estimated: >60 mL/min (ref >60)
Glucose, Bld: 117 mg/dL — ABNORMAL HIGH (ref 70–99)
Potassium: 4.7 mmol/L (ref 3.5–5.1)
Sodium: 133 mmol/L — ABNORMAL LOW (ref 135–145)

## 2023-08-17 LAB — HEPATIC FUNCTION PANEL
ALT: 13 U/L (ref 0–44)
AST: 19 U/L (ref 15–41)
Albumin: 4.5 g/dL (ref 3.5–5.0)
Alkaline Phosphatase: 53 U/L (ref 38–126)
Bilirubin, Direct: 0.2 mg/dL (ref 0.0–0.2)
Indirect Bilirubin: 0.9 mg/dL (ref 0.3–0.9)
Total Bilirubin: 1.1 mg/dL (ref 0.3–1.2)
Total Protein: 7.4 g/dL (ref 6.5–8.1)

## 2023-08-17 LAB — CBC
HCT: 41.3 % (ref 36.0–46.0)
Hemoglobin: 13.2 g/dL (ref 12.0–15.0)
MCH: 31.9 pg (ref 26.0–34.0)
MCHC: 32 g/dL (ref 30.0–36.0)
MCV: 99.8 fL (ref 80.0–100.0)
Platelets: 218 10*3/uL (ref 150–400)
RBC: 4.14 MIL/uL (ref 3.87–5.11)
RDW: 12.8 % (ref 11.5–15.5)
WBC: 7.7 10*3/uL (ref 4.0–10.5)
nRBC: 0 % (ref 0.0–0.2)

## 2023-08-17 LAB — BRAIN NATRIURETIC PEPTIDE: B Natriuretic Peptide: 178.7 pg/mL — ABNORMAL HIGH (ref 0.0–100.0)

## 2023-08-17 LAB — LIPASE, BLOOD: Lipase: 26 U/L (ref 11–51)

## 2023-08-17 LAB — SARS CORONAVIRUS 2 BY RT PCR: SARS Coronavirus 2 by RT PCR: NEGATIVE

## 2023-08-17 LAB — TROPONIN I (HIGH SENSITIVITY): Troponin I (High Sensitivity): 9 ng/L (ref <18)

## 2023-08-17 MED ORDER — ONDANSETRON HCL 4 MG/2ML IJ SOLN: 4.0000 mg | Freq: Four times a day (QID) | INTRAMUSCULAR | Status: DC | PRN

## 2023-08-17 MED ORDER — FOLIC ACID 1 MG PO TABS
1.0000 mg | ORAL_TABLET | Freq: Every day | ORAL | Status: DC
  Administered 2023-08-18: 1 mg via ORAL
  Filled 2023-08-17: qty 1

## 2023-08-17 MED ORDER — GUAIFENESIN ER 600 MG PO TB12
600.0000 mg | ORAL_TABLET | Freq: Two times a day (BID) | ORAL | Status: DC
  Administered 2023-08-17 – 2023-08-18 (×2): 600 mg via ORAL
  Filled 2023-08-17 (×2): qty 1

## 2023-08-17 MED ORDER — MAGNESIUM HYDROXIDE 400 MG/5ML PO SUSP: 30.0000 mL | Freq: Every day | ORAL | Status: DC | PRN

## 2023-08-17 MED ORDER — MIRTAZAPINE 15 MG PO TABS
7.5000 mg | ORAL_TABLET | Freq: Every day | ORAL | Status: DC
  Filled 2023-08-17: qty 1

## 2023-08-17 MED ORDER — ONDANSETRON HCL 4 MG PO TABS: 4.0000 mg | ORAL_TABLET | Freq: Four times a day (QID) | ORAL | Status: DC | PRN

## 2023-08-17 MED ORDER — TRAZODONE HCL 50 MG PO TABS: 25.0000 mg | ORAL_TABLET | Freq: Every evening | ORAL | Status: DC | PRN

## 2023-08-17 MED ORDER — GUAIFENESIN ER 600 MG PO TB12: 600.0000 mg | ORAL_TABLET | Freq: Two times a day (BID) | ORAL | Status: DC

## 2023-08-17 MED ORDER — PANTOPRAZOLE SODIUM 40 MG PO TBEC
40.0000 mg | DELAYED_RELEASE_TABLET | Freq: Two times a day (BID) | ORAL | Status: DC
  Administered 2023-08-17 – 2023-08-18 (×2): 40 mg via ORAL
  Filled 2023-08-17 (×2): qty 1

## 2023-08-17 MED ORDER — FAMOTIDINE 20 MG PO TABS: 40.0000 mg | ORAL_TABLET | Freq: Every day | ORAL | Status: DC

## 2023-08-17 MED ORDER — HYDROCOD POLI-CHLORPHE POLI ER 10-8 MG/5ML PO SUER: 5.0000 mL | Freq: Two times a day (BID) | ORAL | Status: DC | PRN

## 2023-08-17 MED ORDER — POLYETHYLENE GLYCOL 3350 17 G PO PACK: 17.0000 g | PACK | Freq: Every day | ORAL | Status: DC

## 2023-08-17 MED ORDER — ACETAMINOPHEN 325 MG PO TABS
650.0000 mg | ORAL_TABLET | Freq: Four times a day (QID) | ORAL | Status: DC | PRN
  Administered 2023-08-18: 650 mg via ORAL
  Filled 2023-08-17: qty 2

## 2023-08-17 MED ORDER — IOHEXOL 350 MG/ML SOLN
50.0000 mL | Freq: Once | INTRAVENOUS | Status: AC | PRN
  Administered 2023-08-17: 50 mL via INTRAVENOUS

## 2023-08-17 MED ORDER — ACETAMINOPHEN 325 MG RE SUPP: 650.0000 mg | Freq: Four times a day (QID) | RECTAL | Status: DC | PRN

## 2023-08-17 MED ORDER — IPRATROPIUM-ALBUTEROL 0.5-2.5 (3) MG/3ML IN SOLN
3.0000 mL | Freq: Once | RESPIRATORY_TRACT | Status: AC
  Administered 2023-08-17: 3 mL via RESPIRATORY_TRACT
  Filled 2023-08-17: qty 3

## 2023-08-17 MED ORDER — RIVAROXABAN 10 MG PO TABS
10.0000 mg | ORAL_TABLET | Freq: Every day | ORAL | Status: DC
  Filled 2023-08-17: qty 1

## 2023-08-17 MED ORDER — VITAMIN D 25 MCG (1000 UNIT) PO TABS
2000.0000 [IU] | ORAL_TABLET | Freq: Every day | ORAL | Status: DC
  Filled 2023-08-17: qty 2

## 2023-08-17 NOTE — ED Provider Notes (Signed)
[facility] Provider Note    Event Date/Time   First MD Initiated Contact with Patient 08/17/23 1605     (approximate)   History   Chief Complaint Shortness of Breath and Emesis   HPI  Kristina Rowe is a 89 y.o. female with past medical history of hypertension, PE on Xarelto, bronchiectasis, CKD, colon cancer, and anemia who presents to the ED complaining of shortness of breath.  Patient reports that she has been feeling nauseous with increased postnasal drip for about the past week.  This is progressed to some difficulty breathing, especially with exertion.  She denies any fevers and has not had any cough or chest pain.  She has also not noticed any pain or swelling in her legs.  She reports she has been compliant with the Xarelto, saw her PCP earlier this week with unremarkable chest x-ray.  When symptoms persisted, she was referred to the ED for further evaluation by her PCP.     Physical Exam   Triage Vital Signs: ED Triage Vitals  Encounter Vitals Group     BP 08/17/23 1430 123/75     Systolic BP Percentile --      Diastolic BP Percentile --      Pulse Rate 08/17/23 1430 (!) 101     Resp 08/17/23 1430 18     Temp 08/17/23 1430 98.5 F (36.9 C)     Temp src --      SpO2 08/17/23 1429 (!) 86 %     Weight 08/17/23 1429 87 lb (39.5 kg)     Height 08/17/23 1429 5' (1.524 m)     Head Circumference --      Peak Flow --      Pain Score 08/17/23 1429 0     Pain Loc --      Pain Education --      Exclude from Growth Chart --     Most recent vital signs: Vitals:   08/17/23 1430 08/17/23 1900  BP: 123/75   Pulse: (!) 101   Resp: 18   Temp: 98.5 F (36.9 C) 98.2 F (36.8 C)  SpO2: 94%     Constitutional: Alert and oriented. Eyes: Conjunctivae are normal. Head: Atraumatic. Nose: No congestion/rhinnorhea. Mouth/Throat: Mucous membranes are moist.  Cardiovascular: Normal rate, regular rhythm. Grossly normal heart sounds.  2+ radial pulses  bilaterally. Respiratory: Normal respiratory effort.  No retractions. Lungs CTAB. Gastrointestinal: Soft and nontender. No distention. Musculoskeletal: No lower extremity tenderness nor edema.  Neurologic:  Normal speech and language. No gross focal neurologic deficits are appreciated.    ED Results / Procedures / Treatments   Labs (all labs ordered are listed, but only abnormal results are displayed) Labs Reviewed  BASIC METABOLIC PANEL - Abnormal; Notable for the following components:      Result Value   Sodium 133 (*)    Chloride 91 (*)    CO2 33 (*)    Glucose, Bld 117 (*)    All other components within normal limits  BRAIN NATRIURETIC PEPTIDE - Abnormal; Notable for the following components:   B Natriuretic Peptide 178.7 (*)    All other components within normal limits  SARS CORONAVIRUS 2 BY RT PCR  LIPASE, BLOOD  CBC  HEPATIC FUNCTION PANEL  BASIC METABOLIC PANEL  CBC  TROPONIN I (HIGH SENSITIVITY)     EKG  ED ECG REPORT I, Chesley Noon, the attending physician, personally viewed and interpreted this ECG.   Date: 08/17/2023  EKG Time: 14:35  Rate: 98  Rhythm: normal sinus rhythm  Axis: LAD  Intervals:none  ST&T Change: LVH  RADIOLOGY Chest x-ray reviewed and interpreted by me with no infiltrate, edema, or effusion.  PROCEDURES:  Critical Care performed: Yes, see critical care procedure note(s)  .Critical Care  Performed by: Chesley Noon, MD Authorized by: Chesley Noon, MD   Critical care provider statement:    Critical care time (minutes):  30   Critical care time was exclusive of:  Separately billable procedures and treating other patients and teaching time   Critical care was necessary to treat or prevent imminent or life-threatening deterioration of the following conditions:  Respiratory failure   Critical care was time spent personally by me on the following activities:  Development of treatment plan with patient or surrogate, discussions  with consultants, evaluation of patient's response to treatment, examination of patient, ordering and review of laboratory studies, ordering and review of radiographic studies, ordering and performing treatments and interventions, pulse oximetry, re-evaluation of patient's condition and review of old charts   I assumed direction of critical care for this patient from another provider in my specialty: no     Care discussed with: admitting provider      MEDICATIONS ORDERED IN ED: Medications  famotidine (PEPCID) tablet 40 mg (has no administration in time range)  polyethylene glycol (MIRALAX / GLYCOLAX) packet 17 g (has no administration in time range)  folic acid (FOLVITE) tablet 1 mg (has no administration in time range)  rivaroxaban (XARELTO) tablet 10 mg (has no administration in time range)  acetaminophen (TYLENOL) tablet 650 mg (has no administration in time range)    Or  acetaminophen (TYLENOL) suppository 650 mg (has no administration in time range)  traZODone (DESYREL) tablet 25 mg (has no administration in time range)  ondansetron (ZOFRAN) tablet 4 mg (has no administration in time range)    Or  ondansetron (ZOFRAN) injection 4 mg (has no administration in time range)  magnesium hydroxide (MILK OF MAGNESIA) suspension 30 mL (has no administration in time range)  iohexol (OMNIPAQUE) 350 MG/ML injection 50 mL (50 mLs Intravenous Contrast Given 08/17/23 1655)  ipratropium-albuterol (DUONEB) 0.5-2.5 (3) MG/3ML nebulizer solution 3 mL (3 mLs Nebulization Given 08/17/23 2025)     IMPRESSION / MDM / ASSESSMENT AND PLAN / ED COURSE  I reviewed the triage vital signs and the nursing notes.                              89 y.o. female with past medical history of hypertension, PE on Xarelto, anemia, colon cancer, CKD, and bronchiectasis who presents to the ED complaining of postnasal drip, nausea, and increasing difficulty breathing for the past week.  Patient's presentation is most  consistent with acute presentation with potential threat to life or bodily function.  Differential diagnosis includes, but is not limited to, ACS, PE, pneumonia, bronchitis, COVID-19, anemia, electrolyte abnormality, AKI.  Patient nontoxic-appearing and in no acute distress, vital signs initially remarkable for hypoxia with room air saturation of 87%.  Patient was placed on 2 L nasal cannula with improvement, now that she is at rest we will attempt to wean off of oxygen.  EKG shows no evidence of arrhythmia or ischemia and troponin within normal limits, doubt ACS.  She does have a history of PE and we will check CTA of her chest to rule this out.  Chest x-ray is unremarkable and remainder of her  labs are reassuring with no significant anemia, leukocytosis, electrolyte abnormality, or AKI.  Patient once again dropped oxygen saturations to mid 80s on room air, placed back on 2 L nasal cannula.  CTA chest is negative for PE or other acute process, does show moderate sized hiatal hernia.  COVID testing is negative, suspect alternative viral illness as the cause of her respiratory failure.  Case discussed with hospitalist for admission.      FINAL CLINICAL IMPRESSION(S) / ED DIAGNOSES   Final diagnoses:  Acute respiratory failure with hypoxia (HCC)  Viral syndrome     Rx / DC Orders   ED Discharge Orders     None        Note:  This document was prepared using Dragon voice recognition software and may include unintentional dictation errors.   Chesley Noon, MD 08/17/23 2102

## 2023-08-17 NOTE — ED Provider Triage Note (Signed)
Emergency Medicine Provider Triage Evaluation Note  Kristina Rowe , a 89 y.o. female  was evaluated in triage.  Pt complains of vomiting, abdominal pain this morning and also shortness of breath and a cough for about 2 weeks..  Review of Systems  Positive: Vomited this morning.  Having some upper abdominal pain.  Also has been having a cough and mild shortness of breath for about 2 weeks.  She reports this is her third doctors visit in 2 weeks Negative: Chest pain.  Headache.  Fever.  Relates she has chronic back pain due to scoliosis but no other discomfort including specifically no chest pain  Physical Exam  BP 123/75   Pulse (!) 101   Temp 98.5 F (36.9 C)   Resp 18   Ht 5' (1.524 m)   Wt 39.5 kg   SpO2 94%   BMI 16.99 kg/m  Gen:   Awake, no distress   Resp:  Normal effort.  Mild crackles in the bases.  Mild oxygen requirement currently resting comfortably with relief of dyspnea while on 2 L nasal cannula MSK:   Moves extremities without difficulty  Other:    Medical Decision Making  Medically screening exam initiated at 2:46 PM.  Appropriate orders placed.  Kristina Rowe was informed that the remainder of the evaluation will be completed by another provider, this initial triage assessment does not replace that evaluation, and the importance of remaining in the ED until their evaluation is complete.  ECG tracing is interpreted by me at 1440 heart rate 100 QRS 90 QTc 420 Normal sinus rhythm, probable left ventricular hypertrophy with repolarization abnormality but compared to previous EKG significant change in her repolarization and J-point.  She does not have any acute cardiac symptoms and has had symptoms of dyspnea also having abdominal pain vomiting and history of hiatal hernia.  I doubt STEMI given the chronicity and lack of chest pain, but have ordered troponin testing as well as added on hepatic function testing.  Further workup and care anticipated under oncoming ED  physician.   Kristina Creamer, MD 08/17/23 [phone]

## 2023-08-17 NOTE — ED Triage Notes (Signed)
Pt to ED from [facility] for shob for several days and emesis. Reports thinks it is from hernia. 86% on RA in triage, placed on 2 L [place]

## 2023-08-17 NOTE — Assessment & Plan Note (Signed)
-   This could be related to new onset acute CHF possibly diastolic, especially given her orthopnea and dyspnea on exertion as well as elevated BNP. - We will place her on gentle diuresis with IV Lasix. - Will obtain a 2D echo. - O2 protocol will be followed. - She was ruled out PE and pneumonia.

## 2023-08-17 NOTE — H&P (Signed)
[place]   PATIENT NAME: Rynleigh Staal    MR#:  [id number]  DATE OF BIRTH:  1930/01/02  DATE OF ADMISSION:  08/17/2023  PRIMARY CARE PHYSICIAN: Enid Baas, MD   Patient is coming from: Home  REQUESTING/REFERRING PHYSICIAN: Chesley Noon, MD  CHIEF COMPLAINT:   Chief Complaint  Patient presents with   Shortness of Breath   Emesis    HISTORY OF PRESENT ILLNESS:  NAYELEE VELIC is a 89 y.o. Caucasian female with medical history significant for GERD, essential hypertension and PE, who presented to the emergency room with acute onset of dyspnea with associated nausea over the last couple weeks as well as worsening cough specially every morning with postnasal drip.  She had vomiting once with reflux and denies any diarrhea or abdominal pain.  No bilious vomitus or hematemesis.  No chest pain or palpitations.  No wheezing.  No dysuria, oliguria, urinary frequency or urgency or flank pain.  She has been on p.o. Keflex for prophylaxis in the recent past and stopped it.  She admits to two-pillow orthopnea and dyspnea on exertion.  No paroxysmal nocturnal dyspnea.  ED Course: When she into the ER, pulse oximetry was 86% on room air and 94% on 2 L of O2 by nasal cannula, heart rate was 101 with otherwise normal vital signs.  Labs revealed normal CBC and CMP revealed the mild hypokalemia and hyponatremia with otherwise unremarkable CMP.  BNP was elevated at 178.7 and high sensitive troponin 9.  COVID-19 PCR came back negative. EKG as reviewed by me : EKG showed normal sinus rhythm with a rate of 98 with left axis deviation cannot LVH and Q waves inferiorly. Imaging: Two-view chest x-ray showed hyperinflation with no consolidation or pneumothorax or effusion.  Showed severe kyphosis with a curvature of the spine and osteopenia. Chest CTA revealed the following: 1. No evidence for pulmonary embolism. 2. Moderate-sized hiatal hernia. The esophagus is mildly distended with  air-fluid level. Findings may be related to gastroesophageal reflux or esophageal dysmotility. 3. Aortic atherosclerosis.  The patient was placed on O2 per protocol and will be admitted to an observation medical telemetry bed for further evaluation and management. PAST MEDICAL HISTORY:   Past Medical History:  Diagnosis Date   Ductal carcinoma in situ (DCIS) of left breast    Dysphagia    Esophageal stricture    Gastroparesis    GERD (gastroesophageal reflux disease)    Hiatal hernia    Hypertension    Iron deficiency anemia    Osteoporosis    Pulmonary embolism (HCC)    Scoliosis    UTI (urinary tract infection)     PAST SURGICAL HISTORY:   Past Surgical History:  Procedure Laterality Date   BREAST LUMPECTOMY Left 2009   Ductal Carcinoma insitu    CATARACT EXTRACTION, BILATERAL     COLONOSCOPY N/A 03/17/2021   Procedure: COLONOSCOPY;  Surgeon: Regis Bill, MD;  Location: [facility] ENDOSCOPY;  Service: Endoscopy;  Laterality: N/A;   COLOSTOMY REVISION Right 03/18/2021   Procedure: COLON RESECTION RIGHT;  Surgeon: Henrene Dodge, MD;  Location: [facility] ORS;  Service: General;  Laterality: Right;   LAPAROSCOPIC PARAESOPHAGEAL HERNIA REPAIR  2009   LAPAROTOMY N/A 03/10/2021   Procedure: EXPLORATORY LAPAROTOMY;  Surgeon: Henrene Dodge, MD;  Location: [facility] ORS;  Service: General;  Laterality: N/A;   TUBAL LIGATION      SOCIAL HISTORY:   Social History   Tobacco Use   Smoking status: Never  Passive exposure: Never   Smokeless tobacco: Never  Substance Use Topics   Alcohol use: Never    FAMILY HISTORY:   Family History  Problem Relation Age of Onset   Colon cancer Mother    Emphysema Father    Cholelithiasis Sister    Factor V Leiden deficiency Sister    Thyroid disease Sister    Cholelithiasis Sister    Pulmonary embolism Sister    Deep vein thrombosis Brother    Liver cancer Brother    Stroke Brother        died age 30 April 2021, D-Day veteran   Cancer  Brother        oral cancer   Breast cancer Maternal Uncle     DRUG ALLERGIES:   Allergies  Allergen Reactions   Metronidazole Other (See Comments)    Other reaction(s): Other Mouth sores Mouth sores Mouth sores  Other reaction(s): Other (See Comments) Mouth sores Other reaction(s): Other  Mouth sores    Omeprazole Other (See Comments)    Other reaction(s): Arthralgias (intolerance) Other reaction(s): Other  Other reaction(s): Arthralgias (intolerance), Other (See Comments) Other reaction(s): Arthralgias (intolerance)  Other reaction(s): Other    Bactrim [Sulfamethoxazole-Trimethoprim] Nausea Only   Codeine Nausea And Vomiting and Nausea Only    Other reaction(s): Nausea Only But tolerates tramadol  But tolerates tramadol Other reaction(s): Nausea Only  But tolerates tramadol     REVIEW OF SYSTEMS:   ROS As per history of present illness. All pertinent systems were reviewed above. Constitutional, HEENT, cardiovascular, respiratory, GI, GU, musculoskeletal, neuro, psychiatric, endocrine, integumentary and hematologic systems were reviewed and are otherwise negative/unremarkable except for positive findings mentioned above in the HPI.   MEDICATIONS AT HOME:   Prior to Admission medications   Medication Sig Start Date End Date Taking? Authorizing Provider  acetaminophen (TYLENOL) 500 MG tablet Take 500 mg by mouth at bedtime as needed for mild pain, fever or headache.    [provider]  Cephalexin 250 MG tablet Take 0.5 tablets (125mg ) daily for UTI prevention. 11/23/22   Carman Ching, PA-C  Cholecalciferol 50 MCG (2000 UT) CAPS Take by mouth.    [provider]  estradiol (ESTRACE) 0.1 MG/GM vaginal cream Estrogen Cream Instruction Discard applicator Apply pea sized amount to tip of finger to urethra before bed. Wash hands well after application. Use Monday, Wednesday and Friday 03/04/21   Sondra Come, MD  famotidine (PEPCID) 40 MG  tablet Take 40 mg by mouth 2 (two) times daily. 06/15/22   [provider]  folic acid (FOLVITE) 1 MG tablet Take 1 mg by mouth daily. 01/17/19   [provider]  ondansetron (ZOFRAN-ODT) 4 MG disintegrating tablet Take 1 tablet (4 mg total) by mouth every 8 (eight) hours as needed. 08/04/22   Borders, Daryl Eastern, NP  polyethylene glycol (MIRALAX / GLYCOLAX) 17 g packet Take 17 g by mouth daily.    [provider]  rivaroxaban (XARELTO) 10 MG TABS tablet Take 10 mg by mouth daily.    [provider]  Vibegron (GEMTESA) 75 MG TABS Take 1 tablet (75 mg total) by mouth daily. Patient not taking: Reported on 06/19/2023 06/15/23   Carman Ching, PA-C      VITAL SIGNS:  Blood pressure 123/75, pulse 73, temperature 98.2 F (36.8 C), temperature source Oral, resp. rate 18, height 5' (1.524 m), weight 39.5 kg, SpO2 100%.  PHYSICAL EXAMINATION:  Physical Exam  GENERAL:  89 y.o.-year-old patient lying in the bed  with no acute distress.  EYES: Pupils equal, round, reactive to light and accommodation. No scleral icterus. Extraocular muscles intact.  HEENT: Head atraumatic, normocephalic. Oropharynx and nasopharynx clear.  NECK:  Supple, no jugular venous distention. No thyroid enlargement, no tenderness.  LUNGS: Slightly diminished breath sounds in both bases bilaterally, no wheezing, rales,rhonchi or crepitation. No use of accessory muscles of respiration.  CARDIOVASCULAR: Regular rate and rhythm, S1, S2 normal. No murmurs, rubs, or gallops.  ABDOMEN: Soft, nondistended, nontender. Bowel sounds present. No organomegaly or mass.  EXTREMITIES: No pedal edema, cyanosis, or clubbing.  NEUROLOGIC: Cranial nerves II through XII are intact. Muscle strength 5/5 in all extremities. Sensation intact. Gait not checked.  PSYCHIATRIC: The patient is alert and oriented x 3.  Normal affect and good eye contact. SKIN: No obvious rash, lesion, or ulcer.   LABORATORY PANEL:    CBC Recent Labs  Lab 08/17/23 1432  WBC 7.7  HGB 13.2  HCT 41.3  PLT 218   ------------------------------------------------------------------------------------------------------------------  Chemistries  Recent Labs  Lab 08/17/23 1432  NA 133*  K 4.7  CL 91*  CO2 33*  GLUCOSE 117*  BUN 22  CREATININE 0.74  CALCIUM 9.8  AST 19  ALT 13  ALKPHOS 53  BILITOT 1.1   ------------------------------------------------------------------------------------------------------------------  Cardiac Enzymes No results for input(s): "TROPONINI" in the last 168 hours. ------------------------------------------------------------------------------------------------------------------  RADIOLOGY:  CT Angio Chest PE W/Cm &/Or Wo Cm  Result Date: 08/17/2023 CLINICAL DATA:  High probability for PE. History of colon cancer and breast cancer. EXAM: CT ANGIOGRAPHY CHEST WITH CONTRAST TECHNIQUE: Multidetector CT imaging of the chest was performed using the standard protocol during bolus administration of intravenous contrast. Multiplanar CT image reconstructions and MIPs were obtained to evaluate the vascular anatomy. RADIATION DOSE REDUCTION: This exam was performed according to the departmental dose-optimization program which includes automated exposure control, adjustment of the mA and/or kV according to patient size and/or use of iterative reconstruction technique. CONTRAST:  50mL OMNIPAQUE IOHEXOL 350 MG/ML SOLN COMPARISON:  CT chest abdomen and pelvis 06/14/2023 FINDINGS: Cardiovascular: Satisfactory opacification of the pulmonary arteries to the segmental level. No evidence of pulmonary embolism. Normal heart size. No pericardial effusion. There are atherosclerotic calcifications of the aorta. Mediastinum/Nodes: There is a moderate-sized hiatal hernia. The esophagus is mildly distended with air-fluid level. No enlarged lymph nodes are seen. The visualized thyroid gland is within normal limits.  Lungs/Pleura: There is atelectasis in both lung bases. The lungs are otherwise clear. There is no pleural effusion or pneumothorax. Upper Abdomen: No acute abnormality. Musculoskeletal: There is scoliosis of the thoracolumbar spine. Review of the MIP images confirms the above findings. IMPRESSION: 1. No evidence for pulmonary embolism. 2. Moderate-sized hiatal hernia. The esophagus is mildly distended with air-fluid level. Findings may be related to gastroesophageal reflux or esophageal dysmotility. 3. Aortic atherosclerosis. Aortic Atherosclerosis (ICD10-I70.0). Electronically Signed   By: Darliss Cheney M.D.   On: 08/17/2023 18:12   DG Chest 2 View  Result Date: 08/17/2023 CLINICAL DATA:  Shortness of breath.  History of colon cancer. EXAM: CHEST - 2 VIEW COMPARISON:  Chest x-ray 10/28/2021.  CT scan 06/12/2023 FINDINGS: Severe kyphosis with osteopenia of the spine, compression deformities. Degenerative changes. Hyperinflation with expansion in the AP dimension with chronic lung changes and calcifications along the tracheobronchial tree. No consolidation, pneumothorax or effusion today. Stable cardiopericardial silhouette when adjusting for technique. IMPRESSION: Severe kyphosis with a curvature of the spine and osteopenia. Hyperinflation.  No consolidation, pneumothorax.  No effusion. Electronically Signed  By: Karen Kays M.D.   On: 08/17/2023 16:28      IMPRESSION AND PLAN:  Assessment and Plan: * Acute respiratory failure with hypoxia (HCC) - This could be related to new onset acute CHF possibly diastolic, especially given her orthopnea and dyspnea on exertion as well as elevated BNP. - We will place her on gentle diuresis with IV Lasix. - Will obtain a 2D echo. - O2 protocol will be followed. - She was ruled out PE and pneumonia.   GERD (gastroesophageal reflux disease) - She will be placed on p.o. Protonix in addition to H2 blocker therapy. - This could be contributing to her respiratory  symptoms.  History of pulmonary embolus (PE) - We will continue Xarelto.    DVT prophylaxis: Xarelto Advanced Care Planning:  Code Status: The patient is DNR and DNI. Family Communication:  The plan of care was discussed in details with the patient (and family). I answered all questions. The patient agreed to proceed with the above mentioned plan. Further management will depend upon hospital course. Disposition Plan: Back to previous home environment Consults called: none.  All the records are reviewed and case discussed with ED provider.  Status is: Observation  I certify that at the time of admission, it is my clinical judgment that the patient will require hospital care extending less than 2 midnights.                            Dispo: The patient is from: Home              Anticipated d/c is to: Home              Patient currently is not medically stable to d/c.              Difficult to place patient: No  Hannah Beat M.D on 08/17/2023 at 11:36 PM  [facility]   From 7 PM-7 AM, contact night-coverage [email or web address]  CC: Primary care physician; Enid Baas, MD

## 2023-08-17 NOTE — Assessment & Plan Note (Signed)
-  We will continue Xarelto. ?

## 2023-08-17 NOTE — Assessment & Plan Note (Addendum)
-   She will be placed on p.o. Protonix in addition to H2 blocker therapy. - This could be contributing to her respiratory symptoms.

## 2023-08-18 ENCOUNTER — Observation Stay (HOSPITAL_BASED_OUTPATIENT_CLINIC_OR_DEPARTMENT_OTHER)
Admit: 2023-08-18 | Discharge: 2023-08-18 | Disposition: A | Discharge status: Home / Self Care | Payer: Medicare Other | Attending: Family Medicine

## 2023-08-18 DIAGNOSIS — J9601 Acute respiratory failure with hypoxia: Secondary | ICD-10-CM | POA: Diagnosis not present

## 2023-08-18 DIAGNOSIS — I5033 Acute on chronic diastolic (congestive) heart failure: Secondary | ICD-10-CM

## 2023-08-18 DIAGNOSIS — Z86711 Personal history of pulmonary embolism: Secondary | ICD-10-CM | POA: Diagnosis not present

## 2023-08-18 DIAGNOSIS — R0609 Other forms of dyspnea: Secondary | ICD-10-CM | POA: Diagnosis not present

## 2023-08-18 DIAGNOSIS — K219 Gastro-esophageal reflux disease without esophagitis: Secondary | ICD-10-CM | POA: Diagnosis not present

## 2023-08-18 LAB — BASIC METABOLIC PANEL
Anion gap: 7 (ref 5–15)
BUN: 18 mg/dL (ref 8–23)
CO2: 34 mmol/L — ABNORMAL HIGH (ref 22–32)
Calcium: 8.9 mg/dL (ref 8.9–10.3)
Chloride: 92 mmol/L — ABNORMAL LOW (ref 98–111)
Creatinine, Ser: 0.66 mg/dL (ref 0.44–1.00)
GFR, Estimated: >60 mL/min (ref >60)
Glucose, Bld: 87 mg/dL (ref 70–99)
Potassium: 4.2 mmol/L (ref 3.5–5.1)
Sodium: 133 mmol/L — ABNORMAL LOW (ref 135–145)

## 2023-08-18 LAB — ECHOCARDIOGRAM COMPLETE
AR max vel: 0.91 cm2
AV Area VTI: 1.08 cm2
AV Area mean vel: 0.94 cm2
AV Mean grad: 16 mmHg
AV Peak grad: 24.8 mmHg
Ao pk vel: 2.49 m/s
Area-P 1/2: 3.26 cm2
Height: 60 in
MV VTI: 2.12 cm2
S' Lateral: 2.1 cm
Weight: 1392 oz

## 2023-08-18 LAB — CBC
HCT: 33.4 % — ABNORMAL LOW (ref 36.0–46.0)
Hemoglobin: 10.9 g/dL — ABNORMAL LOW (ref 12.0–15.0)
MCH: 32.4 pg (ref 26.0–34.0)
MCHC: 32.6 g/dL (ref 30.0–36.0)
MCV: 99.4 fL (ref 80.0–100.0)
Platelets: 187 10*3/uL (ref 150–400)
RBC: 3.36 MIL/uL — ABNORMAL LOW (ref 3.87–5.11)
RDW: 12.8 % (ref 11.5–15.5)
WBC: 4.8 10*3/uL (ref 4.0–10.5)
nRBC: 0 % (ref 0.0–0.2)

## 2023-08-18 MED ORDER — FUROSEMIDE 20 MG PO TABS: 20.0000 mg | ORAL_TABLET | Freq: Every day | ORAL | 2 refills | Status: DC | PRN

## 2023-08-18 NOTE — Progress Notes (Signed)
*  PRELIMINARY RESULTS* Echocardiogram 2D Echocardiogram has been performed.  Cristela Blue 08/18/2023, 8:22 AM

## 2023-08-18 NOTE — Discharge Summary (Incomplete)
Vibegron Take 1 tablet (75 mg total) by mouth daily.   mirtazapine 7.5 MG tablet Commonly known as: REMERON Take 7.5 mg by mouth at bedtime.   ondansetron 4 MG disintegrating tablet Commonly known as: ZOFRAN-ODT Take 1 tablet (4 mg total) by mouth every 8 (eight) hours as needed.   polyethylene glycol 17 g packet Commonly known as: MIRALAX / GLYCOLAX Take 17 g by mouth daily.   rivaroxaban 10  MG Tabs tablet Commonly known as: XARELTO Take 10 mg by mouth daily.        Discharge Exam: Filed Weights   08/17/23 1429  Weight: 39.5 kg   ***  Condition at discharge: {DC Condition:26389}  The results of significant diagnostics from this hospitalization (including imaging, microbiology, ancillary and laboratory) are listed below for reference.   Imaging Studies: ECHOCARDIOGRAM COMPLETE  Result Date: 08/18/2023    ECHOCARDIOGRAM REPORT   Patient Name:   Kristina Rowe Date of Exam: 08/18/2023 Medical Rec #:  [id number]      Height:       60.0 in Accession #:    [id number]     Weight:       87.0 lb Date of Birth:  03-16-1930     BSA:          1.310 m Patient Age:    89 years       BP:           117/76 mmHg Patient Gender: F              HR:           77 bpm. Exam Location:  [facility] Procedure: 2D Echo, Cardiac Doppler and Color Doppler Indications:     Dyspnea R06.00  History:         Patient has prior history of Echocardiogram examinations, most                  recent 08/02/2022. Risk Factors:Hypertension. Pulmonary embolus.  Sonographer:     Cristela Blue Referring Phys:  [id number] JAN A MANSY Diagnosing Phys: Debbe Odea MD  Sonographer Comments: Image acquisition challenging due to patient body habitus. IMPRESSIONS  1. Left ventricular ejection fraction, by estimation, is 60 to 65%. The left ventricle has normal function. The left ventricle has no regional wall motion abnormalities. There is mild left ventricular hypertrophy. Left ventricular diastolic parameters are consistent with Grade I diastolic dysfunction (impaired relaxation).  2. Right ventricular systolic function is normal. The right ventricular size is moderately enlarged.  3. The mitral valve is normal in structure. Mild mitral valve regurgitation.  4. The aortic valve is calcified. Aortic valve regurgitation is not visualized. Mild to moderate aortic valve stenosis. Aortic valve area, by VTI measures 1.08 cm. Aortic valve mean  gradient measures 16.0 mmHg. Aortic valve Vmax measures 2.49 m/s. FINDINGS  Left Ventricle: Left ventricular ejection fraction, by estimation, is 60 to 65%. The left ventricle has normal function. The left ventricle has no regional wall motion abnormalities. The left ventricular internal cavity size was normal in size. There is  mild left ventricular hypertrophy. Left ventricular diastolic parameters are consistent with Grade I diastolic dysfunction (impaired relaxation). Right Ventricle: The right ventricular size is moderately enlarged. No increase in right ventricular wall thickness. Right ventricular systolic function is normal. Left Atrium: Left atrial size was normal in size. Right Atrium: Right atrial size was normal in size. Pericardium: There is no evidence of pericardial effusion. Mitral Valve: The mitral valve is normal  Physician Discharge Summary   Patient: Kristina Rowe MRN: [id number] DOB: 21-Mar-1930  Admit date:     08/17/2023  Discharge date: 08/18/23  Discharge Physician: Marcelino Duster   PCP: Enid Baas, MD   Recommendations at discharge:  {Tip this will not be part of the note when signed- Example include specific recommendations for outpatient follow-up, pending tests to follow-up on. (Optional):26781}  PCP follow up in 1 week.  Discharge Diagnoses: Principal Problem:   Acute respiratory failure with hypoxia (HCC) Active Problems:   GERD (gastroesophageal reflux disease)   History of pulmonary embolus (PE)  Resolved Problems:   * No resolved hospital problems. *  Hospital Course: No notes on file  Assessment and Plan: * Acute respiratory failure with hypoxia (HCC) - This could be related to new onset acute CHF possibly diastolic, especially given her orthopnea and dyspnea on exertion as well as elevated BNP. - We will place her on gentle diuresis with IV Lasix. - Will obtain a 2D echo. - O2 protocol will be followed. - She was ruled out PE and pneumonia.   GERD (gastroesophageal reflux disease) - She will be placed on p.o. Protonix in addition to H2 blocker therapy. - This could be contributing to her respiratory symptoms.  History of pulmonary embolus (PE) - We will continue Xarelto.      {Tip this will not be part of the note when signed Body mass index is 16.99 kg/m. , ,  (Optional):26781}  {(NOTE) Pain control PDMP Statment (Optional):26782} Consultants: *** Procedures performed: ***  Disposition: {Plan; Disposition:26390} Diet recommendation:  Discharge Diet Orders (From admission, onward)     Start     Ordered   08/18/23 0000  Diet - low sodium heart healthy        08/18/23 1123           {Diet_Plan:26776} DISCHARGE MEDICATION: Allergies as of 08/18/2023       Reactions   Metronidazole Other (See Comments)   Other reaction(s):  Other Mouth sores Mouth sores Mouth sores Other reaction(s): Other (See Comments) Mouth sores Other reaction(s): Other Mouth sores   Omeprazole Other (See Comments)   Other reaction(s): Arthralgias (intolerance) Other reaction(s): Other Other reaction(s): Arthralgias (intolerance), Other (See Comments) Other reaction(s): Arthralgias (intolerance) Other reaction(s): Other   Bactrim [sulfamethoxazole-trimethoprim] Nausea Only   Codeine Nausea And Vomiting, Nausea Only   Other reaction(s): Nausea Only But tolerates tramadol But tolerates tramadol Other reaction(s): Nausea Only But tolerates tramadol        Medication List     STOP taking these medications    Cephalexin 250 MG tablet       TAKE these medications    acetaminophen 500 MG tablet Commonly known as: TYLENOL Take 500 mg by mouth at bedtime as needed for mild pain, fever or headache.   Cholecalciferol 50 MCG (2000 UT) Caps Take by mouth.   estradiol 0.1 MG/GM vaginal cream Commonly known as: ESTRACE Estrogen Cream Instruction Discard applicator Apply pea sized amount to tip of finger to urethra before bed. Wash hands well after application. Use Monday, Wednesday and Friday   famotidine 40 MG tablet Commonly known as: PEPCID Take 40 mg by mouth 2 (two) times daily.   folic acid 1 MG tablet Commonly known as: FOLVITE Take 1 mg by mouth daily.   furosemide 20 MG tablet Commonly known as: Lasix Take 1 tablet (20 mg total) by mouth daily as needed for fluid or edema.   Gemtesa 75 MG Tabs Generic drug:  Vibegron Take 1 tablet (75 mg total) by mouth daily.   mirtazapine 7.5 MG tablet Commonly known as: REMERON Take 7.5 mg by mouth at bedtime.   ondansetron 4 MG disintegrating tablet Commonly known as: ZOFRAN-ODT Take 1 tablet (4 mg total) by mouth every 8 (eight) hours as needed.   polyethylene glycol 17 g packet Commonly known as: MIRALAX / GLYCOLAX Take 17 g by mouth daily.   rivaroxaban 10  MG Tabs tablet Commonly known as: XARELTO Take 10 mg by mouth daily.        Discharge Exam: Filed Weights   08/17/23 1429  Weight: 39.5 kg   ***  Condition at discharge: {DC Condition:26389}  The results of significant diagnostics from this hospitalization (including imaging, microbiology, ancillary and laboratory) are listed below for reference.   Imaging Studies: ECHOCARDIOGRAM COMPLETE  Result Date: 08/18/2023    ECHOCARDIOGRAM REPORT   Patient Name:   Kristina Rowe Date of Exam: 08/18/2023 Medical Rec #:  [id number]      Height:       60.0 in Accession #:    [id number]     Weight:       87.0 lb Date of Birth:  03-16-1930     BSA:          1.310 m Patient Age:    89 years       BP:           117/76 mmHg Patient Gender: F              HR:           77 bpm. Exam Location:  [facility] Procedure: 2D Echo, Cardiac Doppler and Color Doppler Indications:     Dyspnea R06.00  History:         Patient has prior history of Echocardiogram examinations, most                  recent 08/02/2022. Risk Factors:Hypertension. Pulmonary embolus.  Sonographer:     Cristela Blue Referring Phys:  [id number] JAN A MANSY Diagnosing Phys: Debbe Odea MD  Sonographer Comments: Image acquisition challenging due to patient body habitus. IMPRESSIONS  1. Left ventricular ejection fraction, by estimation, is 60 to 65%. The left ventricle has normal function. The left ventricle has no regional wall motion abnormalities. There is mild left ventricular hypertrophy. Left ventricular diastolic parameters are consistent with Grade I diastolic dysfunction (impaired relaxation).  2. Right ventricular systolic function is normal. The right ventricular size is moderately enlarged.  3. The mitral valve is normal in structure. Mild mitral valve regurgitation.  4. The aortic valve is calcified. Aortic valve regurgitation is not visualized. Mild to moderate aortic valve stenosis. Aortic valve area, by VTI measures 1.08 cm. Aortic valve mean  gradient measures 16.0 mmHg. Aortic valve Vmax measures 2.49 m/s. FINDINGS  Left Ventricle: Left ventricular ejection fraction, by estimation, is 60 to 65%. The left ventricle has normal function. The left ventricle has no regional wall motion abnormalities. The left ventricular internal cavity size was normal in size. There is  mild left ventricular hypertrophy. Left ventricular diastolic parameters are consistent with Grade I diastolic dysfunction (impaired relaxation). Right Ventricle: The right ventricular size is moderately enlarged. No increase in right ventricular wall thickness. Right ventricular systolic function is normal. Left Atrium: Left atrial size was normal in size. Right Atrium: Right atrial size was normal in size. Pericardium: There is no evidence of pericardial effusion. Mitral Valve: The mitral valve is normal  Vibegron Take 1 tablet (75 mg total) by mouth daily.   mirtazapine 7.5 MG tablet Commonly known as: REMERON Take 7.5 mg by mouth at bedtime.   ondansetron 4 MG disintegrating tablet Commonly known as: ZOFRAN-ODT Take 1 tablet (4 mg total) by mouth every 8 (eight) hours as needed.   polyethylene glycol 17 g packet Commonly known as: MIRALAX / GLYCOLAX Take 17 g by mouth daily.   rivaroxaban 10  MG Tabs tablet Commonly known as: XARELTO Take 10 mg by mouth daily.        Discharge Exam: Filed Weights   08/17/23 1429  Weight: 39.5 kg   ***  Condition at discharge: {DC Condition:26389}  The results of significant diagnostics from this hospitalization (including imaging, microbiology, ancillary and laboratory) are listed below for reference.   Imaging Studies: ECHOCARDIOGRAM COMPLETE  Result Date: 08/18/2023    ECHOCARDIOGRAM REPORT   Patient Name:   Kristina Rowe Date of Exam: 08/18/2023 Medical Rec #:  [id number]      Height:       60.0 in Accession #:    [id number]     Weight:       87.0 lb Date of Birth:  03-16-1930     BSA:          1.310 m Patient Age:    89 years       BP:           117/76 mmHg Patient Gender: F              HR:           77 bpm. Exam Location:  [facility] Procedure: 2D Echo, Cardiac Doppler and Color Doppler Indications:     Dyspnea R06.00  History:         Patient has prior history of Echocardiogram examinations, most                  recent 08/02/2022. Risk Factors:Hypertension. Pulmonary embolus.  Sonographer:     Cristela Blue Referring Phys:  [id number] JAN A MANSY Diagnosing Phys: Debbe Odea MD  Sonographer Comments: Image acquisition challenging due to patient body habitus. IMPRESSIONS  1. Left ventricular ejection fraction, by estimation, is 60 to 65%. The left ventricle has normal function. The left ventricle has no regional wall motion abnormalities. There is mild left ventricular hypertrophy. Left ventricular diastolic parameters are consistent with Grade I diastolic dysfunction (impaired relaxation).  2. Right ventricular systolic function is normal. The right ventricular size is moderately enlarged.  3. The mitral valve is normal in structure. Mild mitral valve regurgitation.  4. The aortic valve is calcified. Aortic valve regurgitation is not visualized. Mild to moderate aortic valve stenosis. Aortic valve area, by VTI measures 1.08 cm. Aortic valve mean  gradient measures 16.0 mmHg. Aortic valve Vmax measures 2.49 m/s. FINDINGS  Left Ventricle: Left ventricular ejection fraction, by estimation, is 60 to 65%. The left ventricle has normal function. The left ventricle has no regional wall motion abnormalities. The left ventricular internal cavity size was normal in size. There is  mild left ventricular hypertrophy. Left ventricular diastolic parameters are consistent with Grade I diastolic dysfunction (impaired relaxation). Right Ventricle: The right ventricular size is moderately enlarged. No increase in right ventricular wall thickness. Right ventricular systolic function is normal. Left Atrium: Left atrial size was normal in size. Right Atrium: Right atrial size was normal in size. Pericardium: There is no evidence of pericardial effusion. Mitral Valve: The mitral valve is normal  Physician Discharge Summary   Patient: Kristina Rowe MRN: [id number] DOB: 21-Mar-1930  Admit date:     08/17/2023  Discharge date: 08/18/23  Discharge Physician: Marcelino Duster   PCP: Enid Baas, MD   Recommendations at discharge:  {Tip this will not be part of the note when signed- Example include specific recommendations for outpatient follow-up, pending tests to follow-up on. (Optional):26781}  PCP follow up in 1 week.  Discharge Diagnoses: Principal Problem:   Acute respiratory failure with hypoxia (HCC) Active Problems:   GERD (gastroesophageal reflux disease)   History of pulmonary embolus (PE)  Resolved Problems:   * No resolved hospital problems. *  Hospital Course: No notes on file  Assessment and Plan: * Acute respiratory failure with hypoxia (HCC) - This could be related to new onset acute CHF possibly diastolic, especially given her orthopnea and dyspnea on exertion as well as elevated BNP. - We will place her on gentle diuresis with IV Lasix. - Will obtain a 2D echo. - O2 protocol will be followed. - She was ruled out PE and pneumonia.   GERD (gastroesophageal reflux disease) - She will be placed on p.o. Protonix in addition to H2 blocker therapy. - This could be contributing to her respiratory symptoms.  History of pulmonary embolus (PE) - We will continue Xarelto.      {Tip this will not be part of the note when signed Body mass index is 16.99 kg/m. , ,  (Optional):26781}  {(NOTE) Pain control PDMP Statment (Optional):26782} Consultants: *** Procedures performed: ***  Disposition: {Plan; Disposition:26390} Diet recommendation:  Discharge Diet Orders (From admission, onward)     Start     Ordered   08/18/23 0000  Diet - low sodium heart healthy        08/18/23 1123           {Diet_Plan:26776} DISCHARGE MEDICATION: Allergies as of 08/18/2023       Reactions   Metronidazole Other (See Comments)   Other reaction(s):  Other Mouth sores Mouth sores Mouth sores Other reaction(s): Other (See Comments) Mouth sores Other reaction(s): Other Mouth sores   Omeprazole Other (See Comments)   Other reaction(s): Arthralgias (intolerance) Other reaction(s): Other Other reaction(s): Arthralgias (intolerance), Other (See Comments) Other reaction(s): Arthralgias (intolerance) Other reaction(s): Other   Bactrim [sulfamethoxazole-trimethoprim] Nausea Only   Codeine Nausea And Vomiting, Nausea Only   Other reaction(s): Nausea Only But tolerates tramadol But tolerates tramadol Other reaction(s): Nausea Only But tolerates tramadol        Medication List     STOP taking these medications    Cephalexin 250 MG tablet       TAKE these medications    acetaminophen 500 MG tablet Commonly known as: TYLENOL Take 500 mg by mouth at bedtime as needed for mild pain, fever or headache.   Cholecalciferol 50 MCG (2000 UT) Caps Take by mouth.   estradiol 0.1 MG/GM vaginal cream Commonly known as: ESTRACE Estrogen Cream Instruction Discard applicator Apply pea sized amount to tip of finger to urethra before bed. Wash hands well after application. Use Monday, Wednesday and Friday   famotidine 40 MG tablet Commonly known as: PEPCID Take 40 mg by mouth 2 (two) times daily.   folic acid 1 MG tablet Commonly known as: FOLVITE Take 1 mg by mouth daily.   furosemide 20 MG tablet Commonly known as: Lasix Take 1 tablet (20 mg total) by mouth daily as needed for fluid or edema.   Gemtesa 75 MG Tabs Generic drug:

## 2023-08-18 NOTE — Evaluation (Signed)
Physical Therapy Evaluation Patient Details Name: LADEJA FORTNA MRN: [id number] DOB: 1930/06/10 Today's Date: 08/18/2023  History of Present Illness  89 y/o female presented to ED on 08/17/23 from [facility] for SOB and emesis. Admitted for acute respiratory failure with hypoxia. PE and PNA ruled out. PMH: GERD, HTN, hx of PE  Clinical Impression  Patient admitted with the above. PTA, patient lives alone and uses RW in the home and rolling cart in the community. Currently active with OPPT for strengthening due to patient feeling as though she was "losing ground". Patient presents with weakness and decreased activity tolerance. Ambulated in the hallway with personal rolling cart with supervision for safety but no LOB. Patient will benefit from skilled PT services during acute stay to address listed deficits. Patient will benefit from ongoing therapy at discharge to maximize functional independence and safety.         If plan is discharge home, recommend the following: Help with stairs or ramp for entrance;Assist for transportation   Can travel by private vehicle        Equipment Recommendations None recommended by PT  Recommendations for Other Services       Functional Status Assessment Patient has had a recent decline in their functional status and demonstrates the ability to make significant improvements in function in a reasonable and predictable amount of time.     Precautions / Restrictions Precautions Precautions: Fall Restrictions Weight Bearing Restrictions: No      Mobility  Bed Mobility Overal bed mobility: Modified Independent                  Transfers Overall transfer level: Modified independent                      Ambulation/Gait Ambulation/Gait assistance: Supervision Gait Distance (Feet): 200 Feet Assistive device:  (personal rolling cart) Gait Pattern/deviations: Step-through pattern, Decreased stride length, Trunk flexed Gait  velocity: decreased     General Gait Details: No LOB noted during ambulation  Stairs            Wheelchair Mobility     Tilt Bed    Modified Rankin (Stroke Patients Only)       Balance Overall balance assessment: Mild deficits observed, not formally tested                                           Pertinent Vitals/Pain Pain Assessment Pain Assessment: No/denies pain    Home Living Family/patient expects to be discharged to:: Private residence Living Arrangements: Alone Available Help at Discharge: Family;Available PRN/intermittently Type of Home: House Home Access: Stairs to enter   Entrance Stairs-Number of Steps: 1   Home Layout: One level Home Equipment: Agricultural consultant (2 wheels);Rollator (4 wheels);Wheelchair - manual      Prior Function Prior Level of Function : Independent/Modified Independent             Mobility Comments: uses RW in the house and has a rolling cart in the car for community ambulation. Currently active with OPPT       Extremity/Trunk Assessment   Upper Extremity Assessment Upper Extremity Assessment: Overall WFL for tasks assessed    Lower Extremity Assessment Lower Extremity Assessment: Generalized weakness    Cervical / Trunk Assessment Cervical / Trunk Assessment: Kyphotic;Other exceptions Cervical / Trunk Exceptions: severe scoliosis  Communication   Communication  Communication: No apparent difficulties  Cognition Arousal: Alert Behavior During Therapy: WFL for tasks assessed/performed Overall Cognitive Status: Within Functional Limits for tasks assessed                                          General Comments      Exercises     Assessment/Plan    PT Assessment Patient needs continued PT services  PT Problem List Decreased strength;Decreased activity tolerance;Decreased balance;Decreased mobility       PT Treatment Interventions DME instruction;Gait  training;Functional mobility training;Therapeutic activities;Therapeutic exercise;Balance training    PT Goals (Current goals can be found in the Care Plan section)  Acute Rehab PT Goals Patient Stated Goal: to go home now PT Goal Formulation: With patient Time For Goal Achievement: 09/01/23 Potential to Achieve Goals: Good    Frequency Min 1X/week     Co-evaluation               AM-PAC PT "6 Clicks" Mobility  Outcome Measure Help needed turning from your back to your side while in a flat bed without using bedrails?: None Help needed moving from lying on your back to sitting on the side of a flat bed without using bedrails?: None Help needed moving to and from a bed to a chair (including a wheelchair)?: None Help needed standing up from a chair using your arms (e.g., wheelchair or bedside chair)?: None Help needed to walk in hospital room?: A Little Help needed climbing 3-5 steps with a railing? : A Little 6 Click Score: 22    End of Session   Activity Tolerance: Patient tolerated treatment well Patient left: in bed;with call bell/phone within reach;with family/visitor present Nurse Communication: Mobility status PT Visit Diagnosis: Unsteadiness on feet (R26.81);Muscle weakness (generalized) (M62.81)    Time: 1610-9604 PT Time Calculation (min) (ACUTE ONLY): 18 min   Charges:   PT Evaluation $PT Eval Low Complexity: 1 Low   PT General Charges $$ ACUTE PT VISIT: 1 Visit         Maylon Peppers, PT, DPT Physical Therapist - [facility]  [facility]   Morse Brueggemann A Inis Borneman 08/18/2023, 11:29 AM

## 2023-08-18 NOTE — Progress Notes (Signed)
*  PRELIMINARY RESULTS* Echocardiogram 2D Echocardiogram has been performed.  Kristina Rowe 08/18/2023, 8:22 AM

## 2023-08-18 NOTE — ED Notes (Signed)
Pt provided meal tray. Family at bedside. Pt denies any further needs at this time.

## 2023-08-18 NOTE — ED Notes (Signed)
Family at bedside. 

## 2023-08-19 DIAGNOSIS — I5033 Acute on chronic diastolic (congestive) heart failure: Secondary | ICD-10-CM | POA: Insufficient documentation

## 2023-08-22 ENCOUNTER — Ambulatory Visit: Payer: Medicare Other

## 2023-09-04 ENCOUNTER — Ambulatory Visit: Payer: Medicare Other | Attending: Physician Assistant | Admitting: Physical Therapy

## 2023-09-04 DIAGNOSIS — R262 Difficulty in walking, not elsewhere classified: Secondary | ICD-10-CM

## 2023-09-04 DIAGNOSIS — R2681 Unsteadiness on feet: Secondary | ICD-10-CM

## 2023-09-04 DIAGNOSIS — R278 Other lack of coordination: Secondary | ICD-10-CM

## 2023-09-04 DIAGNOSIS — R2689 Other abnormalities of gait and mobility: Secondary | ICD-10-CM

## 2023-09-04 DIAGNOSIS — M5459 Other low back pain: Secondary | ICD-10-CM

## 2023-09-04 DIAGNOSIS — M6281 Muscle weakness (generalized): Secondary | ICD-10-CM | POA: Diagnosis present

## 2023-09-04 NOTE — Therapy (Signed)
OUTPATIENT PHYSICAL THERAPY NEURO EVALUATION   Patient Name: Kristina Rowe MRN: [id number] DOB:[birth year], 89 y.o., female Today's Date: 09/04/2023   PCP: Enid Baas, MD  REFERRING PROVIDER:   Ignacia Bayley, PA-C    END OF SESSION:  PT End of Session - 09/04/23 1403     Visit Number 2    Number of Visits 24    Date for PT Re-Evaluation 11/07/23    PT Start Time 1403    PT Stop Time 1445    PT Time Calculation (min) 42 min    Equipment Utilized During Treatment Gait belt    Activity Tolerance Patient tolerated treatment well    Behavior During Therapy WFL for tasks assessed/performed             Past Medical History:  Diagnosis Date   Ductal carcinoma in situ (DCIS) of left breast    Dysphagia    Esophageal stricture    Gastroparesis    GERD (gastroesophageal reflux disease)    Hiatal hernia    Hypertension    Iron deficiency anemia    Osteoporosis    Pulmonary embolism (HCC)    Scoliosis    UTI (urinary tract infection)    Past Surgical History:  Procedure Laterality Date   BREAST LUMPECTOMY Left 2009   Ductal Carcinoma insitu    CATARACT EXTRACTION, BILATERAL     COLONOSCOPY N/A 03/17/2021   Procedure: COLONOSCOPY;  Surgeon: Regis Bill, MD;  Location: [facility];  Service: Endoscopy;  Laterality: N/A;   COLOSTOMY REVISION Right 03/18/2021   Procedure: COLON RESECTION RIGHT;  Surgeon: Henrene Dodge, MD;  Location: [facility];  Service: General;  Laterality: Right;   LAPAROSCOPIC PARAESOPHAGEAL HERNIA REPAIR  2009   LAPAROTOMY N/A 03/10/2021   Procedure: EXPLORATORY LAPAROTOMY;  Surgeon: Henrene Dodge, MD;  Location: [facility];  Service: General;  Laterality: N/A;   TUBAL LIGATION     Patient Active Problem List   Diagnosis Date Noted   Acute on chronic diastolic CHF (congestive heart failure) (HCC) 08/19/2023   Acute respiratory failure with hypoxia (HCC) 08/17/2023   History of pulmonary embolus (PE) 08/17/2023   Basal cell  carcinoma of leg 04/01/2021   Coronary atherosclerosis 04/01/2021   History of pulmonary embolism 04/01/2021   Pulmonary nodule 04/01/2021   Malignant neoplasm of colon (HCC) 03/29/2021   Rectal bleeding 03/15/2021   Hypertension    GERD (gastroesophageal reflux disease)    Colonic mass    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HCC) 03/10/2021   Anemia, unspecified 03/06/2021   Bronchiectasis without complication (HCC) 03/27/2020   Iron deficiency anemia 03/27/2020   Moderate aortic stenosis 03/27/2020   Stage 3a chronic kidney disease (HCC) 07/04/2019   Hypnic headache 05/28/2019   Telogen effluvium 04/17/2019   Xerosis cutis 04/17/2019   Essential hypertension 01/14/2019   Cervical radiculopathy 05/04/2018   Osteoarthritis of left glenohumeral joint 05/04/2018   Vitamin B12 deficiency 01/01/2018   Vascular abnormality 09/11/2017   Dyspepsia 03/28/2017   Pulmonary embolism (HCC) 08/16/2016   Dilated pore of [place] 03/23/2015   Retinal drusen of both eyes 08/28/2014   Status post right cataract extraction 07/30/2014   Verruca 03/19/2014   Chronic back pain 11/06/2013   Postmenopausal osteoporosis 06/11/2013   Anticoagulated on Coumadin 04/06/2013   Long term (current) use of anticoagulants 10/18/2012   Preglaucoma 05/10/2012   Presbyopia 05/10/2012   Senile nuclear sclerosis 05/10/2012   Lobular carcinoma in situ of breast 04/13/2012   Closed fracture of ankle 02/27/2012  History of nonmelanoma skin cancer 09/05/2011   Other benign neoplasm of skin of trunk 09/05/2011   Osteoporosis 08/25/2011   Transient alteration of awareness 09/03/2009   Colon adenoma 07/30/2002    ONSET DATE: 3 months   REFERRING DIAG:  Diagnosis  R53.83 (ICD-10-CM) - Other fatigue    THERAPY DIAG:  Difficulty in walking, not elsewhere classified  Other abnormalities of gait and mobility  Unsteadiness on feet  Muscle weakness (generalized)  Other low back pain  Other lack of  coordination  Rationale for Evaluation and Treatment: Rehabilitation  SUBJECTIVE:                                                                                                                                                                                             SUBJECTIVE STATEMENT: Pt reports that she is doing Okay today. Reports pain 6/10 in lower thoracic spine. Pt was seen in ED for SOB on 9/19.  No SOB, on this day.   Pt reports that she has been limited from walking due to heat over the summer, not states that her legs are feeling weak and tired as well as SOB with physical activity. Pt [facility] and received PT for BLE weakness/balance and back pain  a little over a year ago per pt report  Pt accompanied by: family member  PERTINENT HISTORY:  From recent MD visit.  89 y.o. here for an acute issue. She has a PMH of chronic PE/Coumadin, anemia, colon cancer, breast cancer, large hiatal hernia, moderate aortic stenosis who has concerns about shortness of breath, nausea, generalized weakness. No chest pain. History of hiatal hernia and recent CT chest. Also recent endoscopy. Takes Zofran chronically. Has felt weaker and almost fell recently. Uses a walker. Also on chronic UTI suppression, Keflex.   PAIN:  Are you having pain? Yes: NPRS scale: 4/10 Pain location: low back  Pain description: sore  Aggravating factors: movement  Relieving factors: rest   PRECAUTIONS: Fall  RED FLAGS: None   WEIGHT BEARING RESTRICTIONS: No  FALLS: Has patient fallen in last 6 months? No  LIVING ENVIRONMENT: Lives with: lives alone Lives in: House/apartment Stairs: Yes: External: 1 steps; none Has following equipment at home: Walker - 4 wheeled  PLOF: Independent with household mobility with device and Independent with community mobility with device  PATIENT GOALS: improved strength in BLE   OBJECTIVE:   DIAGNOSTIC FINDINGS:  06/12/2023 EXAM: CT CHEST, ABDOMEN,  AND PELVIS WITH CONTRAST IMPRESSION: 1. Status post right hemicolectomy, without recurrent or metastatic disease. Decreased sensitivity exam, primarily involving the abdomen pelvis, due to paucity of  fat. 2. New tiny left groin hernia containing fluid. Consider physical exam correlation. 3. Incidental findings, including: Coronary artery atherosclerosis. Aortic Atherosclerosis (ICD10-I70.0). Dilated esophagus with contrast throughout, suggesting dysmotility and/or gastroesophageal reflux.  COGNITION: Overall cognitive status: Within functional limits for tasks assessed   SENSATION: Light touch: Impaired  mild paraesthesia in the proximal RLE   COORDINATION: WFL. Ankle to knee limited ROM due to weakness in the R hip      POSTURE: rounded shoulders, forward head, flexed trunk , and severe scoliosis     LOWER EXTREMITY MMT:    MMT Right Eval Left Eval  Hip flexion 4- 4  Hip extension    Hip abduction 4- 44  Hip adduction 4 4  Hip internal rotation    Hip external rotation    Knee flexion 4- 4  Knee extension 4 4+  Ankle dorsiflexion 4 4+  Ankle plantarflexion    Ankle inversion    Ankle eversion    (Blank rows = not tested)    TRANSFERS: Assistive device utilized: Environmental consultant - 4 wheeled  Sit to stand: SBA Stand to sit: SBA Chair to chair: SBA Floor:  Unable to perform    STAIRS: Level of Assistance:  pt declines Stair Negotiation Technique: Step to Pattern with Bilateral Rails Number of Stairs: 4  Height of Stairs: 6  Comments: will need to assess.   GAIT: Gait pattern: step through pattern, Right foot flat, and lateral hip instability Distance walked: 255ft Assistive device utilized: Walker - 4 wheeled Level of assistance: Modified independence and SBA Comments: severe scoliosis.    FUNCTIONAL TESTS:  5 times sit to stand: 30.86 Timed up and go (TUG): 29.39 2 minute walk test: 242ft  10 meter walk test: 0.616m/s, SOB 4-5.  Berg Balance Scale:  TBD  PATIENT SURVEYS:  FOTO 56. goal 64  TODAY'S TREATMENT:                                                                                                                              DATE: 09/04/2023    VS assessment at start of PT treatment  Sitting 147/82 HR 79 SpO2 97%  Standing 0 min 127/76 HR 82 SpO2 97% no orthostatic s/s  Standing 1 min 139/86 HR 83   Patient demonstrates increased fall risk as noted by score of  26 /56 on Berg Balance Scale.  (<36= high risk for falls, close to 100%; 37-45 significant >80%; 46-51 moderate >50%; 52-55 lower >25%)   [facility] PT Assessment - 09/04/23 0001       Berg Balance Test   Sit to Stand Able to stand  independently using hands    Standing Unsupported Able to stand 2 minutes with supervision    Sitting with Back Unsupported but Feet Supported on Floor or Stool Able to sit safely and securely 2 minutes    Stand to Sit Uses backs of legs against chair to control descent    Transfers  Able to transfer with verbal cueing and /or supervision    Standing Unsupported with Eyes Closed Able to stand 10 seconds with supervision    Standing Unsupported with Feet Together Needs help to attain position and unable to hold for 15 seconds    From Standing, Reach Forward with Outstretched Arm Can reach forward >5 cm safely (2")    From Standing Position, Pick up Object from Floor Able to pick up shoe, needs supervision    From Standing Position, Turn to Look Behind Over each Shoulder Needs supervision when turning    Turn 360 Degrees Needs close supervision or verbal cueing    Standing Unsupported, Alternately Place Feet on Step/Stool Able to complete >2 steps/needs minimal assist    Standing Unsupported, One Foot in Front Needs help to step but can hold 15 seconds    Standing on One Leg Unable to try or needs assist to prevent fall    Total Score 26             HEP education see below with RTB for resistance for hip abduction and flexion. AROM LAQ  and isometric hip adduction   PATIENT EDUCATION: Education details: POC. Goals . Pt educated throughout session about proper posture and technique with exercises. Improved exercise technique, movement at target joints, use of target muscles after min to mod verbal, visual, tactile cues.  Person educated: Patient Education method: Explanation Education comprehension: verbalized understanding and returned demonstration  HOME EXERCISE PROGRAM: Access Code: [email or web address] URL: [email or web address] Date: 09/04/2023 Prepared by: Grier Rocher  Exercises - Seated Hip Abduction with Resistance  - 1 x daily - 4 x weekly - 3 sets - 15 reps - 3 hold - Seated March with Resistance  - 1 x daily - 4 x weekly - 3 sets - 10 reps - 1 hold - Seated Long Arc Quad  - 1 x daily - 4 x weekly - 3 sets - 12 reps - 3 hold - Seated Hip Adduction Isometrics with Ball  - 1 x daily - 4 x weekly - 3 sets - 12 reps - 3 hold  GOALS: Goals reviewed with patient? Yes   SHORT TERM GOALS: Target date: 09/19/2023    Patient will be independent in home exercise program to improve strength/mobility for better functional independence with ADLs. Baseline:to be given at next session  Goal status: INITIAL   LONG TERM GOALS: Target date: 11/07/2023    Patient will increase FOTO score to equal to or greater than  61   to demonstrate statistically significant improvement in mobility and quality of life.  Baseline: 56 Goal status: INITIAL  2.  Patient (> 51 years old) will complete five times sit to stand test in < 15 seconds indicating an increased LE strength and improved balance. Baseline: 30.86 sec Goal status: INITIAL  3.  Patient will increase Berg Balance score by > 6 points to demonstrate decreased fall risk during functional activities  Baseline: 26 Goal status: INITIAL  4.  Patient will increase 10 meter walk test to >1.76m/s as to improve gait speed for better community ambulation and to reduce  fall risk. Baseline: 0.622m/s Goal status: INITIAL  5.  Patient will reduce timed up and go to <11 seconds to reduce fall risk and demonstrate improved transfer/gait ability. Baseline: 29.39 Goal status: INITIAL  6.  Patient will 2 min walk test by at least 68ft as to demonstrate reduced fall risk and improved dynamic gait balance for better safety with community/home ambulation.  Baseline: 220ft Goal status: INITIAL   ASSESSMENT: CLINICAL IMPRESSION: Patient is a 89 y.o. F who was seen today for physical therapy  treatment for BLE weakness and balance deficits. BERG balance scale completed indicating severe fall risk 26/56. HEP initiated for seated therex with RTB and hand out provided. Min cues for full ROM and proper hold at end range to improve neuromotor recruitment Pt will benefor from skilled PT to address strength, balance, ROM, and back pain to improve QoL, reduce fall risk and return to PLOF.   OBJECTIVE IMPAIRMENTS: Abnormal gait, cardiopulmonary status limiting activity, decreased activity tolerance, decreased balance, decreased endurance, decreased mobility, difficulty walking, decreased ROM, decreased strength, decreased safety awareness, hypomobility, increased fascial restrictions, impaired flexibility, improper body mechanics, and postural dysfunction.   ACTIVITY LIMITATIONS: lifting, standing, squatting, transfers, and locomotion level  PARTICIPATION LIMITATIONS: laundry, shopping, and community activity  PERSONAL FACTORS: 3+ comorbidities: scoliosis, hx of CA, and PE  are also affecting patient's functional outcome.   REHAB POTENTIAL: Good  CLINICAL DECISION MAKING: Stable/uncomplicated  EVALUATION COMPLEXITY: Moderate  PLAN:  PT FREQUENCY: 1-2x/week  PT DURATION: 12 weeks  PLANNED INTERVENTIONS: Therapeutic exercises, Therapeutic activity, Neuromuscular re-education, Balance training, Gait training, Patient/Family education, Self Care, Joint mobilization,  Stair training, Vestibular training, DME instructions, Dry Needling, Spinal mobilization, Cryotherapy, Moist heat, and Manual therapy  PLAN FOR NEXT SESSION:   Standing HEP and balance training.   Golden Pop, PT 09/04/2023, 2:04 PM

## 2023-09-06 ENCOUNTER — Encounter: Payer: Self-pay | Admitting: Physical Therapy

## 2023-09-06 ENCOUNTER — Ambulatory Visit: Payer: Medicare Other | Admitting: Physical Therapy

## 2023-09-06 DIAGNOSIS — M6281 Muscle weakness (generalized): Secondary | ICD-10-CM

## 2023-09-06 DIAGNOSIS — R262 Difficulty in walking, not elsewhere classified: Secondary | ICD-10-CM

## 2023-09-06 DIAGNOSIS — R2689 Other abnormalities of gait and mobility: Secondary | ICD-10-CM

## 2023-09-06 DIAGNOSIS — R2681 Unsteadiness on feet: Secondary | ICD-10-CM

## 2023-09-06 NOTE — Therapy (Signed)
OUTPATIENT PHYSICAL THERAPY NEURO EVALUATION   Patient Name: Kristina Rowe MRN: [id number] DOB:December 02, 1929, 89 y.o., female Today's Date: 09/06/2023   PCP: Enid Baas, MD  REFERRING PROVIDER:   Ignacia Bayley, PA-C    END OF SESSION:  PT End of Session - 09/06/23 1145     Visit Number 3    Number of Visits 24    Date for PT Re-Evaluation 11/07/23    PT Start Time 1146    PT Stop Time 1227    PT Time Calculation (min) 41 min    Equipment Utilized During Treatment Gait belt    Activity Tolerance Patient tolerated treatment well    Behavior During Therapy WFL for tasks assessed/performed             Past Medical History:  Diagnosis Date   Ductal carcinoma in situ (DCIS) of left breast    Dysphagia    Esophageal stricture    Gastroparesis    GERD (gastroesophageal reflux disease)    Hiatal hernia    Hypertension    Iron deficiency anemia    Osteoporosis    Pulmonary embolism (HCC)    Scoliosis    UTI (urinary tract infection)    Past Surgical History:  Procedure Laterality Date   BREAST LUMPECTOMY Left 2009   Ductal Carcinoma insitu    CATARACT EXTRACTION, BILATERAL     COLONOSCOPY N/A 03/17/2021   Procedure: COLONOSCOPY;  Surgeon: Regis Bill, MD;  Location: [facility];  Service: Endoscopy;  Laterality: N/A;   COLOSTOMY REVISION Right 03/18/2021   Procedure: COLON RESECTION RIGHT;  Surgeon: Henrene Dodge, MD;  Location: [facility];  Service: General;  Laterality: Right;   LAPAROSCOPIC PARAESOPHAGEAL HERNIA REPAIR  2009   LAPAROTOMY N/A 03/10/2021   Procedure: EXPLORATORY LAPAROTOMY;  Surgeon: Henrene Dodge, MD;  Location: [facility];  Service: General;  Laterality: N/A;   TUBAL LIGATION     Patient Active Problem List   Diagnosis Date Noted   Acute on chronic diastolic CHF (congestive heart failure) (HCC) 08/19/2023   Acute respiratory failure with hypoxia (HCC) 08/17/2023   History of pulmonary embolus (PE) 08/17/2023   Basal cell  carcinoma of leg 04/01/2021   Coronary atherosclerosis 04/01/2021   History of pulmonary embolism 04/01/2021   Pulmonary nodule 04/01/2021   Malignant neoplasm of colon (HCC) 03/29/2021   Rectal bleeding 03/15/2021   Hypertension    GERD (gastroesophageal reflux disease)    Colonic mass    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HCC) 03/10/2021   Anemia, unspecified 03/06/2021   Bronchiectasis without complication (HCC) 03/27/2020   Iron deficiency anemia 03/27/2020   Moderate aortic stenosis 03/27/2020   Stage 3a chronic kidney disease (HCC) 07/04/2019   Hypnic headache 05/28/2019   Telogen effluvium 04/17/2019   Xerosis cutis 04/17/2019   Essential hypertension 01/14/2019   Cervical radiculopathy 05/04/2018   Osteoarthritis of left glenohumeral joint 05/04/2018   Vitamin B12 deficiency 01/01/2018   Vascular abnormality 09/11/2017   Dyspepsia 03/28/2017   Pulmonary embolism (HCC) 08/16/2016   Dilated pore of Winer 03/23/2015   Retinal drusen of both eyes 08/28/2014   Status post right cataract extraction 07/30/2014   Verruca 03/19/2014   Chronic back pain 11/06/2013   Postmenopausal osteoporosis 06/11/2013   Anticoagulated on Coumadin 04/06/2013   Long term (current) use of anticoagulants 10/18/2012   Preglaucoma 05/10/2012   Presbyopia 05/10/2012   Senile nuclear sclerosis 05/10/2012   Lobular carcinoma in situ of breast 04/13/2012   Closed fracture of ankle 02/27/2012  History of nonmelanoma skin cancer 09/05/2011   Other benign neoplasm of skin of trunk 09/05/2011   Osteoporosis 08/25/2011   Transient alteration of awareness 09/03/2009   Colon adenoma 07/30/2002    ONSET DATE: 3 months   REFERRING DIAG:  Diagnosis  R53.83 (ICD-10-CM) - Other fatigue    THERAPY DIAG:  Difficulty in walking, not elsewhere classified  Other abnormalities of gait and mobility  Unsteadiness on feet  Muscle weakness (generalized)  Rationale for Evaluation and Treatment:  Rehabilitation  SUBJECTIVE:                                                                                                                                                                                             SUBJECTIVE STATEMENT: Pt reports no significant changes since last session. Has been working on [employer].   Pt accompanied by: family member  PERTINENT HISTORY:  From recent MD visit.  89 y.o. here for an acute issue. She has a PMH of chronic PE/Coumadin, anemia, colon cancer, breast cancer, large hiatal hernia, moderate aortic stenosis who has concerns about shortness of breath, nausea, generalized weakness. No chest pain. History of hiatal hernia and recent CT chest. Also recent endoscopy. Takes Zofran chronically. Has felt weaker and almost fell recently. Uses a walker. Also on chronic UTI suppression, Keflex.   PAIN:  Are you having pain? Yes: NPRS scale: 4/10 Pain location: low back  Pain description: sore  Aggravating factors: movement  Relieving factors: rest   PRECAUTIONS: Fall  RED FLAGS: None   WEIGHT BEARING RESTRICTIONS: No  FALLS: Has patient fallen in last 6 months? No  LIVING ENVIRONMENT: Lives with: lives alone Lives in: House/apartment Stairs: Yes: External: 1 steps; none Has following equipment at home: Walker - 4 wheeled  PLOF: Independent with household mobility with device and Independent with community mobility with device  PATIENT GOALS: improved strength in BLE   OBJECTIVE:   DIAGNOSTIC FINDINGS:  06/12/2023 EXAM: CT CHEST, ABDOMEN, AND PELVIS WITH CONTRAST IMPRESSION: 1. Status post right hemicolectomy, without recurrent or metastatic disease. Decreased sensitivity exam, primarily involving the abdomen pelvis, due to paucity of fat. 2. New tiny left groin hernia containing fluid. Consider physical exam correlation. 3. Incidental findings, including: Coronary artery atherosclerosis. Aortic Atherosclerosis (ICD10-I70.0). Dilated  esophagus with contrast throughout, suggesting dysmotility and/or gastroesophageal reflux.  COGNITION: Overall cognitive status: Within functional limits for tasks assessed   SENSATION: Light touch: Impaired  mild paraesthesia in the proximal RLE   COORDINATION: WFL. Ankle to knee limited ROM due to weakness in the R hip      POSTURE: rounded shoulders, forward head, flexed trunk ,  and severe scoliosis     LOWER EXTREMITY MMT:    MMT Right Eval Left Eval  Hip flexion 4- 4  Hip extension    Hip abduction 4- 44  Hip adduction 4 4  Hip internal rotation    Hip external rotation    Knee flexion 4- 4  Knee extension 4 4+  Ankle dorsiflexion 4 4+  Ankle plantarflexion    Ankle inversion    Ankle eversion    (Blank rows = not tested)    TRANSFERS: Assistive device utilized: Environmental consultant - 4 wheeled  Sit to stand: SBA Stand to sit: SBA Chair to chair: SBA Floor:  Unable to perform    STAIRS: Level of Assistance:  pt declines Stair Negotiation Technique: Step to Pattern with Bilateral Rails Number of Stairs: 4  Height of Stairs: 6  Comments: will need to assess.   GAIT: Gait pattern: step through pattern, Right foot flat, and lateral hip instability Distance walked: 24ft Assistive device utilized: Walker - 4 wheeled Level of assistance: Modified independence and SBA Comments: severe scoliosis.    FUNCTIONAL TESTS:  5 times sit to stand: 30.86 Timed up and go (TUG): 29.39 2 minute walk test: 274ft  10 meter walk test: 0.664m/s, SOB 4-5.  Berg Balance Scale: TBD  PATIENT SURVEYS:  FOTO 56. goal 64  TODAY'S TREATMENT:                                                                                                                              DATE: 09/06/2023   Standing marches with 2.5# AW 2 x 10  Standing HS curls 2.5# AW 2 x 10  REST Sidestepping in // bars 3 laps with 2.5# AW  Seated LAQ 2 x 10 reps with 2.5# AW  REST STS with UE support on airex x 5  reps  Seated PF with resistance x 20 reps with GTB NMR Airex stance WBOS 3 x 30 sec  SLS progression with 1 LE on floor and one on step x 30 sec ea side x 2 reps   PATIENT EDUCATION: Education details: POC. Goals . Pt educated throughout session about proper posture and technique with exercises. Improved exercise technique, movement at target joints, use of target muscles after min to mod verbal, visual, tactile cues.  Person educated: Patient Education method: Explanation Education comprehension: verbalized understanding and returned demonstration  HOME EXERCISE PROGRAM: Access Code: [email or web address] URL: [email or web address] Date: 09/04/2023 Prepared by: Grier Rocher  Exercises - Seated Hip Abduction with Resistance  - 1 x daily - 4 x weekly - 3 sets - 15 reps - 3 hold - Seated March with Resistance  - 1 x daily - 4 x weekly - 3 sets - 10 reps - 1 hold - Seated Long Arc Quad  - 1 x daily - 4 x weekly - 3 sets - 12 reps - 3 hold - Seated Hip Adduction Isometrics with Ball  - 1 x daily -  4 x weekly - 3 sets - 12 reps - 3 hold  GOALS: Goals reviewed with patient? Yes   SHORT TERM GOALS: Target date: 09/19/2023    Patient will be independent in home exercise program to improve strength/mobility for better functional independence with ADLs. Baseline:to be given at next session  Goal status: INITIAL   LONG TERM GOALS: Target date: 11/07/2023    Patient will increase FOTO score to equal to or greater than  61   to demonstrate statistically significant improvement in mobility and quality of life.  Baseline: 56 Goal status: INITIAL  2.  Patient (> 31 years old) will complete five times sit to stand test in < 15 seconds indicating an increased LE strength and improved balance. Baseline: 30.86 sec Goal status: INITIAL  3.  Patient will increase Berg Balance score by > 6 points to demonstrate decreased fall risk during functional activities  Baseline: 26 Goal  status: INITIAL  4.  Patient will increase 10 meter walk test to >1.4m/s as to improve gait speed for better community ambulation and to reduce fall risk. Baseline: 0.640m/s Goal status: INITIAL  5.  Patient will reduce timed up and go to <11 seconds to reduce fall risk and demonstrate improved transfer/gait ability. Baseline: 29.39 Goal status: INITIAL  6.  Patient will 2 min walk test by at least 24ft as to demonstrate reduced fall risk and improved dynamic gait balance for better safety with community/home ambulation.   Baseline: 270ft Goal status: INITIAL   ASSESSMENT: CLINICAL IMPRESSION:  Patient is a 89 y.o. F who was seen today for physical therapy  treatment for BLE weakness and balance deficits. Pt continues with strengthening interventions to improve LE strength and endurance and began with static balance activities to improve her balance and stability. Pt will continue to benefit from skilled physical therapy intervention to address impairments, improve QOL, and attain therapy goals.     OBJECTIVE IMPAIRMENTS: Abnormal gait, cardiopulmonary status limiting activity, decreased activity tolerance, decreased balance, decreased endurance, decreased mobility, difficulty walking, decreased ROM, decreased strength, decreased safety awareness, hypomobility, increased fascial restrictions, impaired flexibility, improper body mechanics, and postural dysfunction.   ACTIVITY LIMITATIONS: lifting, standing, squatting, transfers, and locomotion level  PARTICIPATION LIMITATIONS: laundry, shopping, and community activity  PERSONAL FACTORS: 3+ comorbidities: scoliosis, hx of CA, and PE  are also affecting patient's functional outcome.   REHAB POTENTIAL: Good  CLINICAL DECISION MAKING: Stable/uncomplicated  EVALUATION COMPLEXITY: Moderate  PLAN:  PT FREQUENCY: 1-2x/week  PT DURATION: 12 weeks  PLANNED INTERVENTIONS: Therapeutic exercises, Therapeutic activity, Neuromuscular  re-education, Balance training, Gait training, Patient/Family education, Self Care, Joint mobilization, Stair training, Vestibular training, DME instructions, Dry Needling, Spinal mobilization, Cryotherapy, Moist heat, and Manual therapy  PLAN FOR NEXT SESSION:   Standing HEP and balance training.   Norman Herrlich, PT 09/06/2023, 11:48 AM

## 2023-09-11 ENCOUNTER — Ambulatory Visit: Payer: Medicare Other

## 2023-09-11 DIAGNOSIS — R262 Difficulty in walking, not elsewhere classified: Secondary | ICD-10-CM | POA: Diagnosis not present

## 2023-09-11 DIAGNOSIS — R2681 Unsteadiness on feet: Secondary | ICD-10-CM

## 2023-09-11 DIAGNOSIS — M6281 Muscle weakness (generalized): Secondary | ICD-10-CM

## 2023-09-11 NOTE — Therapy (Signed)
OUTPATIENT PHYSICAL THERAPY NEURO TREATMENT NOTE   Patient Name: Kristina Rowe MRN: [id number] DOB:06-Feb-1930, 89 y.o., female Today's Date: 09/11/2023   PCP: Enid Baas, MD  REFERRING PROVIDER:   Ignacia Bayley, PA-C    END OF SESSION:  PT End of Session - 09/11/23 1444     Visit Number 4    Number of Visits 24    Date for PT Re-Evaluation 11/07/23    PT Start Time 1446    PT Stop Time 1528    PT Time Calculation (min) 42 min    Equipment Utilized During Treatment Gait belt    Activity Tolerance Patient tolerated treatment well;No increased pain    Behavior During Therapy WFL for tasks assessed/performed              Past Medical History:  Diagnosis Date   Ductal carcinoma in situ (DCIS) of left breast    Dysphagia    Esophageal stricture    Gastroparesis    GERD (gastroesophageal reflux disease)    Hiatal hernia    Hypertension    Iron deficiency anemia    Osteoporosis    Pulmonary embolism (HCC)    Scoliosis    UTI (urinary tract infection)    Past Surgical History:  Procedure Laterality Date   BREAST LUMPECTOMY Left 2009   Ductal Carcinoma insitu    CATARACT EXTRACTION, BILATERAL     COLONOSCOPY N/A 03/17/2021   Procedure: COLONOSCOPY;  Surgeon: Regis Bill, MD;  Location: [facility];  Service: Endoscopy;  Laterality: N/A;   COLOSTOMY REVISION Right 03/18/2021   Procedure: COLON RESECTION RIGHT;  Surgeon: Henrene Dodge, MD;  Location: [facility];  Service: General;  Laterality: Right;   LAPAROSCOPIC PARAESOPHAGEAL HERNIA REPAIR  2009   LAPAROTOMY N/A 03/10/2021   Procedure: EXPLORATORY LAPAROTOMY;  Surgeon: Henrene Dodge, MD;  Location: [facility];  Service: General;  Laterality: N/A;   TUBAL LIGATION     Patient Active Problem List   Diagnosis Date Noted   Acute on chronic diastolic CHF (congestive heart failure) (HCC) 08/19/2023   Acute respiratory failure with hypoxia (HCC) 08/17/2023   History of pulmonary embolus (PE)  08/17/2023   Basal cell carcinoma of leg 04/01/2021   Coronary atherosclerosis 04/01/2021   History of pulmonary embolism 04/01/2021   Pulmonary nodule 04/01/2021   Malignant neoplasm of colon (HCC) 03/29/2021   Rectal bleeding 03/15/2021   Hypertension    GERD (gastroesophageal reflux disease)    Colonic mass    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HCC) 03/10/2021   Anemia, unspecified 03/06/2021   Bronchiectasis without complication (HCC) 03/27/2020   Iron deficiency anemia 03/27/2020   Moderate aortic stenosis 03/27/2020   Stage 3a chronic kidney disease (HCC) 07/04/2019   Hypnic headache 05/28/2019   Telogen effluvium 04/17/2019   Xerosis cutis 04/17/2019   Essential hypertension 01/14/2019   Cervical radiculopathy 05/04/2018   Osteoarthritis of left glenohumeral joint 05/04/2018   Vitamin B12 deficiency 01/01/2018   Vascular abnormality 09/11/2017   Dyspepsia 03/28/2017   Pulmonary embolism (HCC) 08/16/2016   Dilated pore of [place]iner 03/23/2015   Retinal drusen of both eyes 08/28/2014   Status post right cataract extraction 07/30/2014   Verruca 03/19/2014   Chronic back pain 11/06/2013   Postmenopausal osteoporosis 06/11/2013   Anticoagulated on Coumadin 04/06/2013   Long term (current) use of anticoagulants 10/18/2012   Preglaucoma 05/10/2012   Presbyopia 05/10/2012   Senile nuclear sclerosis 05/10/2012   Lobular carcinoma in situ of breast 04/13/2012   Closed fracture of  ankle 02/27/2012   History of nonmelanoma skin cancer 09/05/2011   Other benign neoplasm of skin of trunk 09/05/2011   Osteoporosis 08/25/2011   Transient alteration of awareness 09/03/2009   Colon adenoma 07/30/2002    ONSET DATE: 3 months   REFERRING DIAG:  Diagnosis  R53.83 (ICD-10-CM) - Other fatigue    THERAPY DIAG:  Muscle weakness (generalized)  Difficulty in walking, not elsewhere classified  Unsteadiness on feet  Rationale for Evaluation and Treatment: Rehabilitation  SUBJECTIVE:                                                                                                                                                                                              SUBJECTIVE STATEMENT: Pt rates back pain currently 6/10. Pt reports no stumbles/falls. Pt reports HEP is going OK.  Pt accompanied by: family member  PERTINENT HISTORY:  From recent MD visit.  89 y.o. here for an acute issue. She has a PMH of chronic PE/Coumadin, anemia, colon cancer, breast cancer, large hiatal hernia, moderate aortic stenosis who has concerns about shortness of breath, nausea, generalized weakness. No chest pain. History of hiatal hernia and recent CT chest. Also recent endoscopy. Takes Zofran chronically. Has felt weaker and almost fell recently. Uses a walker. Also on chronic UTI suppression, Keflex.   PAIN:  Are you having pain? Yes: NPRS scale: 4/10 Pain location: low back  Pain description: sore  Aggravating factors: movement  Relieving factors: rest   PRECAUTIONS: Fall  RED FLAGS: None   WEIGHT BEARING RESTRICTIONS: No  FALLS: Has patient fallen in last 6 months? No  LIVING ENVIRONMENT: Lives with: lives alone Lives in: House/apartment Stairs: Yes: External: 1 steps; none Has following equipment at home: Walker - 4 wheeled  PLOF: Independent with household mobility with device and Independent with community mobility with device  PATIENT GOALS: improved strength in BLE   OBJECTIVE:   DIAGNOSTIC FINDINGS:  06/12/2023 EXAM: CT CHEST, ABDOMEN, AND PELVIS WITH CONTRAST IMPRESSION: 1. Status post right hemicolectomy, without recurrent or metastatic disease. Decreased sensitivity exam, primarily involving the abdomen pelvis, due to paucity of fat. 2. New tiny left groin hernia containing fluid. Consider physical exam correlation. 3. Incidental findings, including: Coronary artery atherosclerosis. Aortic Atherosclerosis (ICD10-I70.0). Dilated esophagus with contrast  throughout, suggesting dysmotility and/or gastroesophageal reflux.  COGNITION: Overall cognitive status: Within functional limits for tasks assessed   SENSATION: Light touch: Impaired  mild paraesthesia in the proximal RLE   COORDINATION: WFL. Ankle to knee limited ROM due to weakness in the R hip      POSTURE: rounded shoulders, forward head, flexed trunk ,  and severe scoliosis     LOWER EXTREMITY MMT:    MMT Right Eval Left Eval  Hip flexion 4- 4  Hip extension    Hip abduction 4- 44  Hip adduction 4 4  Hip internal rotation    Hip external rotation    Knee flexion 4- 4  Knee extension 4 4+  Ankle dorsiflexion 4 4+  Ankle plantarflexion    Ankle inversion    Ankle eversion    (Blank rows = not tested)    TRANSFERS: Assistive device utilized: Environmental consultant - 4 wheeled  Sit to stand: SBA Stand to sit: SBA Chair to chair: SBA Floor:  Unable to perform    STAIRS: Level of Assistance:  pt declines Stair Negotiation Technique: Step to Pattern with Bilateral Rails Number of Stairs: 4  Height of Stairs: 6  Comments: will need to assess.   GAIT: Gait pattern: step through pattern, Right foot flat, and lateral hip instability Distance walked: 252ft Assistive device utilized: Walker - 4 wheeled Level of assistance: Modified independence and SBA Comments: severe scoliosis.    FUNCTIONAL TESTS:  5 times sit to stand: 30.86 Timed up and go (TUG): 29.39 2 minute walk test: 216ft  10 meter walk test: 0.646m/s, SOB 4-5.  Berg Balance Scale: TBD  PATIENT SURVEYS:  FOTO 56. goal 64  TODAY'S TREATMENT:                                                                                                                              DATE: 09/11/2023   Standing marches with 2.5# AW 2 x 10  REST Standing HS curls 2.5# AW 1 x 12, 1x10  Sidestepping in // bars 2x60 sec with 2.5# AW  Seated LAQ 1 x 12, 1x10 reps with 2.5# AW - fatiguing LE, more so on the RLE REST STS with  UE support on airex x 6 reps with BUE push off arm rests STS 5x  Seated heel raises with 2.5# AW 2x15 - feels more difficult on RLE   Ambulation for strength/endurance with 2.5# AW x 296 ft   STS 6x   PATIENT EDUCATION: Education details: Pt educated throughout session about proper posture and technique with exercises. Improved exercise technique, movement at target joints, use of target muscles after min to mod verbal, visual, tactile cues.  Person educated: Patient Education method: Explanation Education comprehension: verbalized understanding and returned demonstration  HOME EXERCISE PROGRAM: Access Code: [email or web address] URL: [email or web address] Date: 09/04/2023 Prepared by: Grier Rocher  Exercises - Seated Hip Abduction with Resistance  - 1 x daily - 4 x weekly - 3 sets - 15 reps - 3 hold - Seated March with Resistance  - 1 x daily - 4 x weekly - 3 sets - 10 reps - 1 hold - Seated Long Arc Quad  - 1 x daily - 4 x weekly - 3 sets - 12 reps - 3 hold - Seated Hip Adduction Isometrics with [place]  -  1 x daily - 4 x weekly - 3 sets - 12 reps - 3 hold  GOALS: Goals reviewed with patient? Yes   SHORT TERM GOALS: Target date: 09/19/2023    Patient will be independent in home exercise program to improve strength/mobility for better functional independence with ADLs. Baseline:to be given at next session  Goal status: INITIAL   LONG TERM GOALS: Target date: 11/07/2023    Patient will increase FOTO score to equal to or greater than  61   to demonstrate statistically significant improvement in mobility and quality of life.  Baseline: 56 Goal status: INITIAL  2.  Patient (> 87 years old) will complete five times sit to stand test in < 15 seconds indicating an increased LE strength and improved balance. Baseline: 30.86 sec Goal status: INITIAL  3.  Patient will increase Berg Balance score by > 6 points to demonstrate decreased fall risk during functional activities   Baseline: 26 Goal status: INITIAL  4.  Patient will increase 10 meter walk test to >1.72m/s as to improve gait speed for better community ambulation and to reduce fall risk. Baseline: 0.616m/s Goal status: INITIAL  5.  Patient will reduce timed up and go to <11 seconds to reduce fall risk and demonstrate improved transfer/gait ability. Baseline: 29.39 Goal status: INITIAL  6.  Patient will 2 min walk test by at least 72ft as to demonstrate reduced fall risk and improved dynamic gait balance for better safety with community/home ambulation.   Baseline: 269ft Goal status: INITIAL   ASSESSMENT: CLINICAL IMPRESSION: Continued plan as laid out in recent sessions. Pt demonstrates progress by performing interventions with increased reps. Also initiated gait for endurance with ankle weights. Pt will continue to benefit from skilled physical therapy intervention to address impairments, improve QOL, and attain therapy goals.     OBJECTIVE IMPAIRMENTS: Abnormal gait, cardiopulmonary status limiting activity, decreased activity tolerance, decreased balance, decreased endurance, decreased mobility, difficulty walking, decreased ROM, decreased strength, decreased safety awareness, hypomobility, increased fascial restrictions, impaired flexibility, improper body mechanics, and postural dysfunction.   ACTIVITY LIMITATIONS: lifting, standing, squatting, transfers, and locomotion level  PARTICIPATION LIMITATIONS: laundry, shopping, and community activity  PERSONAL FACTORS: 3+ comorbidities: scoliosis, hx of CA, and PE  are also affecting patient's functional outcome.   REHAB POTENTIAL: Good  CLINICAL DECISION MAKING: Stable/uncomplicated  EVALUATION COMPLEXITY: Moderate  PLAN:  PT FREQUENCY: 1-2x/week  PT DURATION: 12 weeks  PLANNED INTERVENTIONS: Therapeutic exercises, Therapeutic activity, Neuromuscular re-education, Balance training, Gait training, Patient/Family education, Self Care,  Joint mobilization, Stair training, Vestibular training, DME instructions, Dry Needling, Spinal mobilization, Cryotherapy, Moist heat, and Manual therapy  PLAN FOR NEXT SESSION:   Standing HEP and balance training.   Baird Kay, PT 09/11/2023, 3:30 PM

## 2023-09-13 ENCOUNTER — Ambulatory Visit: Payer: Medicare Other | Admitting: Physical Therapy

## 2023-09-19 ENCOUNTER — Ambulatory Visit: Payer: Medicare Other | Admitting: Physical Therapy

## 2023-09-19 DIAGNOSIS — M5459 Other low back pain: Secondary | ICD-10-CM

## 2023-09-19 DIAGNOSIS — M6281 Muscle weakness (generalized): Secondary | ICD-10-CM

## 2023-09-19 DIAGNOSIS — R262 Difficulty in walking, not elsewhere classified: Secondary | ICD-10-CM | POA: Diagnosis not present

## 2023-09-19 DIAGNOSIS — R2681 Unsteadiness on feet: Secondary | ICD-10-CM

## 2023-09-19 DIAGNOSIS — R278 Other lack of coordination: Secondary | ICD-10-CM

## 2023-09-19 DIAGNOSIS — R2689 Other abnormalities of gait and mobility: Secondary | ICD-10-CM

## 2023-09-19 NOTE — Therapy (Signed)
OUTPATIENT PHYSICAL THERAPY NEURO TREATMENT NOTE   Patient Name: Kristina Rowe MRN: [id number] DOB:10/30/1930, 89 y.o., female Today's Date: 09/19/2023   PCP: Enid Baas, MD  REFERRING PROVIDER:   Ignacia Bayley, PA-C    END OF SESSION:  PT End of Session - 09/19/23 1448     Visit Number 5    Number of Visits 24    Date for PT Re-Evaluation 11/07/23    PT Start Time 1449    PT Stop Time 1530    PT Time Calculation (min) 41 min    Equipment Utilized During Treatment Gait belt    Activity Tolerance Patient tolerated treatment well;No increased pain    Behavior During Therapy WFL for tasks assessed/performed              Past Medical History:  Diagnosis Date   Ductal carcinoma in situ (DCIS) of left breast    Dysphagia    Esophageal stricture    Gastroparesis    GERD (gastroesophageal reflux disease)    Hiatal hernia    Hypertension    Iron deficiency anemia    Osteoporosis    Pulmonary embolism (HCC)    Scoliosis    UTI (urinary tract infection)    Past Surgical History:  Procedure Laterality Date   BREAST LUMPECTOMY Left 2009   Ductal Carcinoma insitu    CATARACT EXTRACTION, BILATERAL     COLONOSCOPY N/A 03/17/2021   Procedure: COLONOSCOPY;  Surgeon: Regis Bill, MD;  Location: [facility];  Service: Endoscopy;  Laterality: N/A;   COLOSTOMY REVISION Right 03/18/2021   Procedure: COLON RESECTION RIGHT;  Surgeon: Henrene Dodge, MD;  Location: [facility];  Service: General;  Laterality: Right;   LAPAROSCOPIC PARAESOPHAGEAL HERNIA REPAIR  2009   LAPAROTOMY N/A 03/10/2021   Procedure: EXPLORATORY LAPAROTOMY;  Surgeon: Henrene Dodge, MD;  Location: [facility];  Service: General;  Laterality: N/A;   TUBAL LIGATION     Patient Active Problem List   Diagnosis Date Noted   Acute on chronic diastolic CHF (congestive heart failure) (HCC) 08/19/2023   Acute respiratory failure with hypoxia (HCC) 08/17/2023   History of pulmonary embolus (PE)  08/17/2023   Basal cell carcinoma of leg 04/01/2021   Coronary atherosclerosis 04/01/2021   History of pulmonary embolism 04/01/2021   Pulmonary nodule 04/01/2021   Malignant neoplasm of colon (HCC) 03/29/2021   Rectal bleeding 03/15/2021   Hypertension    GERD (gastroesophageal reflux disease)    Colonic mass    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HCC) 03/10/2021   Anemia, unspecified 03/06/2021   Bronchiectasis without complication (HCC) 03/27/2020   Iron deficiency anemia 03/27/2020   Moderate aortic stenosis 03/27/2020   Stage 3a chronic kidney disease (HCC) 07/04/2019   Hypnic headache 05/28/2019   Telogen effluvium 04/17/2019   Xerosis cutis 04/17/2019   Essential hypertension 01/14/2019   Cervical radiculopathy 05/04/2018   Osteoarthritis of left glenohumeral joint 05/04/2018   Vitamin B12 deficiency 01/01/2018   Vascular abnormality 09/11/2017   Dyspepsia 03/28/2017   Pulmonary embolism (HCC) 08/16/2016   Dilated pore of [place] 03/23/2015   Retinal drusen of both eyes 08/28/2014   Status post right cataract extraction 07/30/2014   Verruca 03/19/2014   Chronic back pain 11/06/2013   Postmenopausal osteoporosis 06/11/2013   Anticoagulated on Coumadin 04/06/2013   Long term (current) use of anticoagulants 10/18/2012   Preglaucoma 05/10/2012   Presbyopia 05/10/2012   Senile nuclear sclerosis 05/10/2012   Lobular carcinoma in situ of breast 04/13/2012   Closed fracture of  ankle 02/27/2012   History of nonmelanoma skin cancer 09/05/2011   Other benign neoplasm of skin of trunk 09/05/2011   Osteoporosis 08/25/2011   Transient alteration of awareness 09/03/2009   Colon adenoma 07/30/2002    ONSET DATE: 3 months   REFERRING DIAG:  Diagnosis  R53.83 (ICD-10-CM) - Other fatigue    THERAPY DIAG:  No diagnosis found.  Rationale for Evaluation and Treatment: Rehabilitation  SUBJECTIVE:                                                                                                                                                                                              SUBJECTIVE STATEMENT: Pt rates back pain currently 6/10. Pt reports no stumbles/falls.   Pt accompanied by: family member  PERTINENT HISTORY:  From recent MD visit.  89 y.o. here for an acute issue. She has a PMH of chronic PE/Coumadin, anemia, colon cancer, breast cancer, large hiatal hernia, moderate aortic stenosis who has concerns about shortness of breath, nausea, generalized weakness. No chest pain. History of hiatal hernia and recent CT chest. Also recent endoscopy. Takes Zofran chronically. Has felt weaker and almost fell recently. Uses a walker. Also on chronic UTI suppression, Keflex.   PAIN:  Are you having pain? Yes: NPRS scale: 6/10 Pain location: low back  Pain description: sore  Aggravating factors: movement  Relieving factors: rest   PRECAUTIONS: Fall  RED FLAGS: None   WEIGHT BEARING RESTRICTIONS: No  FALLS: Has patient fallen in last 6 months? No  LIVING ENVIRONMENT: Lives with: lives alone Lives in: House/apartment Stairs: Yes: External: 1 steps; none Has following equipment at home: Walker - 4 wheeled  PLOF: Independent with household mobility with device and Independent with community mobility with device  PATIENT GOALS: improved strength in BLE   OBJECTIVE:   DIAGNOSTIC FINDINGS:  06/12/2023 EXAM: CT CHEST, ABDOMEN, AND PELVIS WITH CONTRAST IMPRESSION: 1. Status post right hemicolectomy, without recurrent or metastatic disease. Decreased sensitivity exam, primarily involving the abdomen pelvis, due to paucity of fat. 2. New tiny left groin hernia containing fluid. Consider physical exam correlation. 3. Incidental findings, including: Coronary artery atherosclerosis. Aortic Atherosclerosis (ICD10-I70.0). Dilated esophagus with contrast throughout, suggesting dysmotility and/or gastroesophageal reflux.  COGNITION: Overall cognitive status: Within  functional limits for tasks assessed   SENSATION: Light touch: Impaired  mild paraesthesia in the proximal RLE   COORDINATION: WFL. Ankle to knee limited ROM due to weakness in the R hip      POSTURE: rounded shoulders, forward head, flexed trunk , and severe scoliosis     LOWER EXTREMITY MMT:    MMT Right Eval  Left Eval  Hip flexion 4- 4  Hip extension    Hip abduction 4- 44  Hip adduction 4 4  Hip internal rotation    Hip external rotation    Knee flexion 4- 4  Knee extension 4 4+  Ankle dorsiflexion 4 4+  Ankle plantarflexion    Ankle inversion    Ankle eversion    (Blank rows = not tested)    TRANSFERS: Assistive device utilized: Environmental consultant - 4 wheeled  Sit to stand: SBA Stand to sit: SBA Chair to chair: SBA Floor:  Unable to perform    STAIRS: Level of Assistance:  pt declines Stair Negotiation Technique: Step to Pattern with Bilateral Rails Number of Stairs: 4  Height of Stairs: 6  Comments: will need to assess.   GAIT: Gait pattern: step through pattern, Right foot flat, and lateral hip instability Distance walked: 249ft Assistive device utilized: Walker - 4 wheeled Level of assistance: Modified independence and SBA Comments: severe scoliosis.    FUNCTIONAL TESTS:  5 times sit to stand: 30.86 Timed up and go (TUG): 29.39 2 minute walk test: 252ft  10 meter walk test: 0.647m/s, SOB 4-5.  Berg Balance Scale: TBD  PATIENT SURVEYS:  FOTO 56. goal 64  TODAY'S TREATMENT:                                                                                                                              DATE: 09/19/2023     Nustep level 1 x 5 min BLE only.  Pt rates as medium to hard.   In parallel bars:  Side stepping R and L 3 laps UE supported .  Forward/reverse gait x 3 laps. UE supported on first bout only.   CGA for safety with retroversion for safety, noted to have increased trunk flexion with fatigue on last bout with mild anterior LOB.    Sitting  Ankle PF x 20  Sitting ankle DF x 20  Standing march x 10 bil  Sitting isometric hip adduction x 15 bil with 3 sec hold  Sitting mid row matrix 7.5 # 2 x 10  Seated hip abduction manual resistance x 12   Cues form PT for proper speed of movement and ROm as listed below. Mild SI pain with hip extension on the LLE and adduction   PATIENT EDUCATION: Education details: Pt educated throughout session about proper posture and technique with exercises. Improved exercise technique, movement at target joints, use of target muscles after min to mod verbal, visual, tactile cues.  Person educated: Patient Education method: Explanation Education comprehension: verbalized understanding and returned demonstration  HOME EXERCISE PROGRAM: Access Code: [email or web address] URL: [email or web address] Date: 09/04/2023 Prepared by: Grier Rocher  Exercises - Seated Hip Abduction with Resistance  - 1 x daily - 4 x weekly - 3 sets - 15 reps - 3 hold - Seated March with Resistance  - 1 x daily - 4 x weekly - 3 sets - 10  reps - 1 hold - Seated Long Arc Quad  - 1 x daily - 4 x weekly - 3 sets - 12 reps - 3 hold - Seated Hip Adduction Isometrics with Ball  - 1 x daily - 4 x weekly - 3 sets - 12 reps - 3 hold  GOALS: Goals reviewed with patient? Yes   SHORT TERM GOALS: Target date: 09/19/2023    Patient will be independent in home exercise program to improve strength/mobility for better functional independence with ADLs. Baseline:to be given at next session  Goal status: INITIAL   LONG TERM GOALS: Target date: 11/07/2023    Patient will increase FOTO score to equal to or greater than  61   to demonstrate statistically significant improvement in mobility and quality of life.  Baseline: 56 Goal status: INITIAL  2.  Patient (> 55 years old) will complete five times sit to stand test in < 15 seconds indicating an increased LE strength and improved balance. Baseline: 30.86 sec Goal  status: INITIAL  3.  Patient will increase Berg Balance score by > 6 points to demonstrate decreased fall risk during functional activities  Baseline: 26 Goal status: INITIAL  4.  Patient will increase 10 meter walk test to >1.61m/s as to improve gait speed for better community ambulation and to reduce fall risk. Baseline: 0.625m/s Goal status: INITIAL  5.  Patient will reduce timed up and go to <11 seconds to reduce fall risk and demonstrate improved transfer/gait ability. Baseline: 29.39 Goal status: INITIAL  6.  Patient will 2 min walk test by at least 59ft as to demonstrate reduced fall risk and improved dynamic gait balance for better safety with community/home ambulation.   Baseline: 273ft Goal status: INITIAL   ASSESSMENT: CLINICAL IMPRESSION: Continued plan as laid out in recent sessions. Pt demonstrates progress by performing interventions with increased resistance. Therex to encourage trunk extension and improve parascapular sequencing. Mild SI pain in the L side with hip extension, but resolved with seated rest break. Pt will continue to benefit from skilled physical therapy intervention to address impairments, improve QOL, and attain therapy goals.     OBJECTIVE IMPAIRMENTS: Abnormal gait, cardiopulmonary status limiting activity, decreased activity tolerance, decreased balance, decreased endurance, decreased mobility, difficulty walking, decreased ROM, decreased strength, decreased safety awareness, hypomobility, increased fascial restrictions, impaired flexibility, improper body mechanics, and postural dysfunction.   ACTIVITY LIMITATIONS: lifting, standing, squatting, transfers, and locomotion level  PARTICIPATION LIMITATIONS: laundry, shopping, and community activity  PERSONAL FACTORS: 3+ comorbidities: scoliosis, hx of CA, and PE  are also affecting patient's functional outcome.   REHAB POTENTIAL: Good  CLINICAL DECISION MAKING: Stable/uncomplicated  EVALUATION  COMPLEXITY: Moderate  PLAN:  PT FREQUENCY: 1-2x/week  PT DURATION: 12 weeks  PLANNED INTERVENTIONS: Therapeutic exercises, Therapeutic activity, Neuromuscular re-education, Balance training, Gait training, Patient/Family education, Self Care, Joint mobilization, Stair training, Vestibular training, DME instructions, Dry Needling, Spinal mobilization, Cryotherapy, Moist heat, and Manual therapy  PLAN FOR NEXT SESSION:   Standing HEP and balance training.   Golden Pop, PT 09/19/2023, 2:48 PM

## 2023-09-25 ENCOUNTER — Ambulatory Visit: Payer: Medicare Other | Admitting: Physical Therapy

## 2023-09-25 DIAGNOSIS — R2689 Other abnormalities of gait and mobility: Secondary | ICD-10-CM

## 2023-09-25 DIAGNOSIS — R278 Other lack of coordination: Secondary | ICD-10-CM

## 2023-09-25 DIAGNOSIS — M5459 Other low back pain: Secondary | ICD-10-CM

## 2023-09-25 DIAGNOSIS — M6281 Muscle weakness (generalized): Secondary | ICD-10-CM

## 2023-09-25 DIAGNOSIS — R262 Difficulty in walking, not elsewhere classified: Secondary | ICD-10-CM | POA: Diagnosis not present

## 2023-09-25 DIAGNOSIS — R2681 Unsteadiness on feet: Secondary | ICD-10-CM

## 2023-09-25 NOTE — Therapy (Signed)
OUTPATIENT PHYSICAL THERAPY NEURO TREATMENT NOTE   Patient Name: Kristina Rowe MRN: [id number] DOB:May 09, 1930, 89 y.o., female Today's Date: 09/25/2023   PCP: Enid Baas, MD  REFERRING PROVIDER:   Ignacia Bayley, PA-C    END OF SESSION:  PT End of Session - 09/25/23 1535     Visit Number 6    Number of Visits 24    Date for PT Re-Evaluation 11/07/23    PT Start Time 1536    PT Stop Time 1615    PT Time Calculation (min) 39 min    Equipment Utilized During Treatment Gait belt    Activity Tolerance Patient tolerated treatment well;No increased pain    Behavior During Therapy WFL for tasks assessed/performed              Past Medical History:  Diagnosis Date   Ductal carcinoma in situ (DCIS) of left breast    Dysphagia    Esophageal stricture    Gastroparesis    GERD (gastroesophageal reflux disease)    Hiatal hernia    Hypertension    Iron deficiency anemia    Osteoporosis    Pulmonary embolism (HCC)    Scoliosis    UTI (urinary tract infection)    Past Surgical History:  Procedure Laterality Date   BREAST LUMPECTOMY Left 2009   Ductal Carcinoma insitu    CATARACT EXTRACTION, BILATERAL     COLONOSCOPY N/A 03/17/2021   Procedure: COLONOSCOPY;  Surgeon: Regis Bill, MD;  Location: [facility];  Service: Endoscopy;  Laterality: N/A;   COLOSTOMY REVISION Right 03/18/2021   Procedure: COLON RESECTION RIGHT;  Surgeon: Henrene Dodge, MD;  Location: [facility];  Service: General;  Laterality: Right;   LAPAROSCOPIC PARAESOPHAGEAL HERNIA REPAIR  2009   LAPAROTOMY N/A 03/10/2021   Procedure: EXPLORATORY LAPAROTOMY;  Surgeon: Henrene Dodge, MD;  Location: [facility];  Service: General;  Laterality: N/A;   TUBAL LIGATION     Patient Active Problem List   Diagnosis Date Noted   Acute on chronic diastolic CHF (congestive heart failure) (HCC) 08/19/2023   Acute respiratory failure with hypoxia (HCC) 08/17/2023   History of pulmonary embolus (PE)  08/17/2023   Basal cell carcinoma of leg 04/01/2021   Coronary atherosclerosis 04/01/2021   History of pulmonary embolism 04/01/2021   Pulmonary nodule 04/01/2021   Malignant neoplasm of colon (HCC) 03/29/2021   Rectal bleeding 03/15/2021   Hypertension    GERD (gastroesophageal reflux disease)    Colonic mass    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HCC) 03/10/2021   Anemia, unspecified 03/06/2021   Bronchiectasis without complication (HCC) 03/27/2020   Iron deficiency anemia 03/27/2020   Moderate aortic stenosis 03/27/2020   Stage 3a chronic kidney disease (HCC) 07/04/2019   Hypnic headache 05/28/2019   Telogen effluvium 04/17/2019   Xerosis cutis 04/17/2019   Essential hypertension 01/14/2019   Cervical radiculopathy 05/04/2018   Osteoarthritis of left glenohumeral joint 05/04/2018   Vitamin B12 deficiency 01/01/2018   Vascular abnormality 09/11/2017   Dyspepsia 03/28/2017   Pulmonary embolism (HCC) 08/16/2016   Dilated pore of [place] 03/23/2015   Retinal drusen of both eyes 08/28/2014   Status post right cataract extraction 07/30/2014   Verruca 03/19/2014   Chronic back pain 11/06/2013   Postmenopausal osteoporosis 06/11/2013   Anticoagulated on Coumadin 04/06/2013   Long term (current) use of anticoagulants 10/18/2012   Preglaucoma 05/10/2012   Presbyopia 05/10/2012   Senile nuclear sclerosis 05/10/2012   Lobular carcinoma in situ of breast 04/13/2012   Closed fracture of  ankle 02/27/2012   History of nonmelanoma skin cancer 09/05/2011   Other benign neoplasm of skin of trunk 09/05/2011   Osteoporosis 08/25/2011   Transient alteration of awareness 09/03/2009   Colon adenoma 07/30/2002    ONSET DATE: 3 months   REFERRING DIAG:  Diagnosis  R53.83 (ICD-10-CM) - Other fatigue    THERAPY DIAG:  Muscle weakness (generalized)  Difficulty in walking, not elsewhere classified  Unsteadiness on feet  Other abnormalities of gait and mobility  Other low back pain  Other lack  of coordination  Rationale for Evaluation and Treatment: Rehabilitation  SUBJECTIVE:                                                                                                                                                                                             SUBJECTIVE STATEMENT: Pt reports that she had increased back pain over the weekend following PT treatment with increased weights. Pain 6-7/10 at worst.  States that she took pain med prior to PT treatment and pain 3/10 at start of PT treatment.   Pt accompanied by: family member  PERTINENT HISTORY:  From recent MD visit.  89 y.o. here for an acute issue. She has a PMH of chronic PE/Coumadin, anemia, colon cancer, breast cancer, large hiatal hernia, moderate aortic stenosis who has concerns about shortness of breath, nausea, generalized weakness. No chest pain. History of hiatal hernia and recent CT chest. Also recent endoscopy. Takes Zofran chronically. Has felt weaker and almost fell recently. Uses a walker. Also on chronic UTI suppression, Keflex.   PAIN:  Are you having pain? Yes: NPRS scale: 3/10 Pain location: low back  Pain description: sore  Aggravating factors: movement  Relieving factors: rest   PRECAUTIONS: Fall  RED FLAGS: None   WEIGHT BEARING RESTRICTIONS: No  FALLS: Has patient fallen in last 6 months? No  LIVING ENVIRONMENT: Lives with: lives alone Lives in: House/apartment Stairs: Yes: External: 1 steps; none Has following equipment at home: Walker - 4 wheeled  PLOF: Independent with household mobility with device and Independent with community mobility with device  PATIENT GOALS: improved strength in BLE   OBJECTIVE:   DIAGNOSTIC FINDINGS:  06/12/2023 EXAM: CT CHEST, ABDOMEN, AND PELVIS WITH CONTRAST IMPRESSION: 1. Status post right hemicolectomy, without recurrent or metastatic disease. Decreased sensitivity exam, primarily involving the abdomen pelvis, due to paucity of fat. 2.  New tiny left groin hernia containing fluid. Consider physical exam correlation. 3. Incidental findings, including: Coronary artery atherosclerosis. Aortic Atherosclerosis (ICD10-I70.0). Dilated esophagus with contrast throughout, suggesting dysmotility and/or gastroesophageal reflux.  COGNITION: Overall cognitive status: Within functional limits for tasks assessed  SENSATION: Light touch: Impaired  mild paraesthesia in the proximal RLE   COORDINATION: WFL. Ankle to knee limited ROM due to weakness in the R hip      POSTURE: rounded shoulders, forward head, flexed trunk , and severe scoliosis     LOWER EXTREMITY MMT:    MMT Right Eval Left Eval  Hip flexion 4- 4  Hip extension    Hip abduction 4- 44  Hip adduction 4 4  Hip internal rotation    Hip external rotation    Knee flexion 4- 4  Knee extension 4 4+  Ankle dorsiflexion 4 4+  Ankle plantarflexion    Ankle inversion    Ankle eversion    (Blank rows = not tested)    TRANSFERS: Assistive device utilized: Environmental consultant - 4 wheeled  Sit to stand: SBA Stand to sit: SBA Chair to chair: SBA Floor:  Unable to perform    STAIRS: Level of Assistance:  pt declines Stair Negotiation Technique: Step to Pattern with Bilateral Rails Number of Stairs: 4  Height of Stairs: 6  Comments: will need to assess.   GAIT: Gait pattern: step through pattern, Right foot flat, and lateral hip instability Distance walked: 226ft Assistive device utilized: Walker - 4 wheeled Level of assistance: Modified independence and SBA Comments: severe scoliosis.    FUNCTIONAL TESTS:  5 times sit to stand: 30.86 Timed up and go (TUG): 29.39 2 minute walk test: 267ft  10 meter walk test: 0.661m/s, SOB 4-5.  Berg Balance Scale: TBD  PATIENT SURVEYS:  FOTO 56. goal 64  TODAY'S TREATMENT:                                                                                                                              DATE: 09/25/2023      Nustep level 1 x 2.5 min x2 with 1.5 min BLE only.  Pt rates as medium to hard.   Standing HEP provided by PT. See below for HEP expansion.  - Standing March with Counter Support  - 10 reps - Standing Hip Abduction with Counter Support - 10 reps - Standing Hip Extension with Counter Support  - 10 reps - Mini Squat with Counter Support  - 10 reps - Standing Knee Flexion with Counter Support - 10 reps - Standing Tandem Balance with Counter Support  - 10 hold  Cues form PT for proper speed of movement and ROm as listed below. No hip pain reported on this day.   PATIENT EDUCATION: Education details: Pt educated throughout session about proper posture and technique with exercises. Improved exercise technique, movement at target joints, use of target muscles after min to mod verbal, visual, tactile cues.  Person educated: Patient Education method: Explanation Education comprehension: verbalized understanding and returned demonstration  HOME EXERCISE PROGRAM: Access Code: [email or web address] URL: [email or web address] Date: 09/25/2023 Prepared by: Grier Rocher  Exercises - Seated Hip Abduction with Resistance  - 1 x daily - 4 x weekly -  3 sets - 15 reps - 3 hold - Seated March with Resistance  - 1 x daily - 4 x weekly - 3 sets - 10 reps - 1 hold - Seated Long Arc Quad  - 1 x daily - 4 x weekly - 3 sets - 12 reps - 3 hold - Seated Hip Adduction Isometrics with Ball  - 1 x daily - 4 x weekly - 3 sets - 12 reps - 3 hold - Standing March with Counter Support  - 1 x daily - 3 x weekly - 2 sets - 10 reps - Standing Hip Abduction with Counter Support  - 1 x daily - 3 x weekly - 2 sets - 10 reps - Standing Hip Extension with Counter Support  - 1 x daily - 3 x weekly - 2 sets - 10 reps - Mini Squat with Counter Support  - 1 x daily - 3 x weekly - 2 sets - 10 reps - Standing Knee Flexion with Counter Support  - 1 x daily - 3 x weekly - 2 sets - 10 reps - Standing Tandem Balance with  Counter Support  - 1 x daily - 3 x weekly - 2 sets - 4 reps - 10 hold  GOALS: Goals reviewed with patient? Yes   SHORT TERM GOALS: Target date: 09/19/2023    Patient will be independent in home exercise program to improve strength/mobility for better functional independence with ADLs. Baseline:to be given at next session  Goal status: INITIAL   LONG TERM GOALS: Target date: 11/07/2023    Patient will increase FOTO score to equal to or greater than  61   to demonstrate statistically significant improvement in mobility and quality of life.  Baseline: 56 Goal status: INITIAL  2.  Patient (> 4 years old) will complete five times sit to stand test in < 15 seconds indicating an increased LE strength and improved balance. Baseline: 30.86 sec Goal status: INITIAL  3.  Patient will increase Berg Balance score by > 6 points to demonstrate decreased fall risk during functional activities  Baseline: 26 Goal status: INITIAL  4.  Patient will increase 10 meter walk test to >1.59m/s as to improve gait speed for better community ambulation and to reduce fall risk. Baseline: 0.680m/s Goal status: INITIAL  5.  Patient will reduce timed up and go to <11 seconds to reduce fall risk and demonstrate improved transfer/gait ability. Baseline: 29.39 Goal status: INITIAL  6.  Patient will 2 min walk test by at least 45ft as to demonstrate reduced fall risk and improved dynamic gait balance for better safety with community/home ambulation.   Baseline: 271ft Goal status: INITIAL   ASSESSMENT: CLINICAL IMPRESSION: Continued plan as laid out in recent sessions. PT expanded HEP to include standing therex. Min cues for ROM and proper UE support. Pt report no pain in hips on this day. Able to demonstrate improved trunk extension with UE supported on bar. Pt will continue to benefit from skilled physical therapy intervention to address impairments, improve QOL, and attain therapy goals.     OBJECTIVE  IMPAIRMENTS: Abnormal gait, cardiopulmonary status limiting activity, decreased activity tolerance, decreased balance, decreased endurance, decreased mobility, difficulty walking, decreased ROM, decreased strength, decreased safety awareness, hypomobility, increased fascial restrictions, impaired flexibility, improper body mechanics, and postural dysfunction.   ACTIVITY LIMITATIONS: lifting, standing, squatting, transfers, and locomotion level  PARTICIPATION LIMITATIONS: laundry, shopping, and community activity  PERSONAL FACTORS: 3+ comorbidities: scoliosis, hx of CA, and PE  are also  affecting patient's functional outcome.   REHAB POTENTIAL: Good  CLINICAL DECISION MAKING: Stable/uncomplicated  EVALUATION COMPLEXITY: Moderate  PLAN:  PT FREQUENCY: 1-2x/week  PT DURATION: 12 weeks  PLANNED INTERVENTIONS: Therapeutic exercises, Therapeutic activity, Neuromuscular re-education, Balance training, Gait training, Patient/Family education, Self Care, Joint mobilization, Stair training, Vestibular training, DME instructions, Dry Needling, Spinal mobilization, Cryotherapy, Moist heat, and Manual therapy  PLAN FOR NEXT SESSION:   Dynamic balance training BLE strengthening.   Golden Pop, PT 09/25/2023, 3:35 PM

## 2023-09-27 ENCOUNTER — Ambulatory Visit: Payer: Medicare Other | Admitting: Physical Therapy

## 2023-10-05 ENCOUNTER — Ambulatory Visit: Payer: Medicare Other | Attending: Physician Assistant

## 2023-10-05 DIAGNOSIS — M5459 Other low back pain: Secondary | ICD-10-CM | POA: Insufficient documentation

## 2023-10-05 DIAGNOSIS — R262 Difficulty in walking, not elsewhere classified: Secondary | ICD-10-CM | POA: Insufficient documentation

## 2023-10-05 DIAGNOSIS — R2681 Unsteadiness on feet: Secondary | ICD-10-CM | POA: Diagnosis present

## 2023-10-05 DIAGNOSIS — M6281 Muscle weakness (generalized): Secondary | ICD-10-CM | POA: Diagnosis present

## 2023-10-05 DIAGNOSIS — R278 Other lack of coordination: Secondary | ICD-10-CM | POA: Insufficient documentation

## 2023-10-05 DIAGNOSIS — R2689 Other abnormalities of gait and mobility: Secondary | ICD-10-CM | POA: Diagnosis present

## 2023-10-05 NOTE — Therapy (Signed)
OUTPATIENT PHYSICAL THERAPY NEURO TREATMENT NOTE   Patient Name: Kristina Rowe MRN: [id number] DOB:1930-03-02, 89 y.o., female Today's Date: 10/05/2023   PCP: Enid Baas, MD  REFERRING PROVIDER:   Ignacia Bayley, PA-C    END OF SESSION:  PT End of Session - 10/05/23 1151     Visit Number 7    Number of Visits 24    Date for PT Re-Evaluation 11/07/23    PT Start Time 1147    PT Stop Time 1228    PT Time Calculation (min) 41 min    Equipment Utilized During Treatment Gait belt    Activity Tolerance Patient tolerated treatment well;No increased pain    Behavior During Therapy WFL for tasks assessed/performed               Past Medical History:  Diagnosis Date   Ductal carcinoma in situ (DCIS) of left breast    Dysphagia    Esophageal stricture    Gastroparesis    GERD (gastroesophageal reflux disease)    Hiatal hernia    Hypertension    Iron deficiency anemia    Osteoporosis    Pulmonary embolism (HCC)    Scoliosis    UTI (urinary tract infection)    Past Surgical History:  Procedure Laterality Date   BREAST LUMPECTOMY Left 2009   Ductal Carcinoma insitu    CATARACT EXTRACTION, BILATERAL     COLONOSCOPY N/A 03/17/2021   Procedure: COLONOSCOPY;  Surgeon: Regis Bill, MD;  Location: [facility];  Service: Endoscopy;  Laterality: N/A;   COLOSTOMY REVISION Right 03/18/2021   Procedure: COLON RESECTION RIGHT;  Surgeon: Henrene Dodge, MD;  Location: [facility];  Service: General;  Laterality: Right;   LAPAROSCOPIC PARAESOPHAGEAL HERNIA REPAIR  2009   LAPAROTOMY N/A 03/10/2021   Procedure: EXPLORATORY LAPAROTOMY;  Surgeon: Henrene Dodge, MD;  Location: [facility];  Service: General;  Laterality: N/A;   TUBAL LIGATION     Patient Active Problem List   Diagnosis Date Noted   Acute on chronic diastolic CHF (congestive heart failure) (HCC) 08/19/2023   Acute respiratory failure with hypoxia (HCC) 08/17/2023   History of pulmonary embolus (PE)  08/17/2023   Basal cell carcinoma of leg 04/01/2021   Coronary atherosclerosis 04/01/2021   History of pulmonary embolism 04/01/2021   Pulmonary nodule 04/01/2021   Malignant neoplasm of colon (HCC) 03/29/2021   Rectal bleeding 03/15/2021   Hypertension    GERD (gastroesophageal reflux disease)    Colonic mass    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HCC) 03/10/2021   Anemia, unspecified 03/06/2021   Bronchiectasis without complication (HCC) 03/27/2020   Iron deficiency anemia 03/27/2020   Moderate aortic stenosis 03/27/2020   Stage 3a chronic kidney disease (HCC) 07/04/2019   Hypnic headache 05/28/2019   Telogen effluvium 04/17/2019   Xerosis cutis 04/17/2019   Essential hypertension 01/14/2019   Cervical radiculopathy 05/04/2018   Osteoarthritis of left glenohumeral joint 05/04/2018   Vitamin B12 deficiency 01/01/2018   Vascular abnormality 09/11/2017   Dyspepsia 03/28/2017   Pulmonary embolism (HCC) 08/16/2016   Dilated pore of [place] 03/23/2015   Retinal drusen of both eyes 08/28/2014   Status post right cataract extraction 07/30/2014   Verruca 03/19/2014   Chronic back pain 11/06/2013   Postmenopausal osteoporosis 06/11/2013   Anticoagulated on Coumadin 04/06/2013   Long term (current) use of anticoagulants 10/18/2012   Preglaucoma 05/10/2012   Presbyopia 05/10/2012   Senile nuclear sclerosis 05/10/2012   Lobular carcinoma in situ of breast 04/13/2012   Closed fracture  of ankle 02/27/2012   History of nonmelanoma skin cancer 09/05/2011   Other benign neoplasm of skin of trunk 09/05/2011   Osteoporosis 08/25/2011   Transient alteration of awareness 09/03/2009   Colon adenoma 07/30/2002    ONSET DATE: 3 months   REFERRING DIAG:  Diagnosis  R53.83 (ICD-10-CM) - Other fatigue    THERAPY DIAG:  Muscle weakness (generalized)  Difficulty in walking, not elsewhere classified  Unsteadiness on feet  Other abnormalities of gait and mobility  Other low back pain  Other lack  of coordination  Rationale for Evaluation and Treatment: Rehabilitation  SUBJECTIVE:                                                                                                                                                                                             SUBJECTIVE STATEMENT: Patient reports no increased pain after last session.    Pt accompanied by: family member  PERTINENT HISTORY:  From recent MD visit.  89 y.o. here for an acute issue. She has a PMH of chronic PE/Coumadin, anemia, colon cancer, breast cancer, large hiatal hernia, moderate aortic stenosis who has concerns about shortness of breath, nausea, generalized weakness. No chest pain. History of hiatal hernia and recent CT chest. Also recent endoscopy. Takes Zofran chronically. Has felt weaker and almost fell recently. Uses a walker. Also on chronic UTI suppression, Keflex.   PAIN:  Are you having pain? Yes: NPRS scale: 3/10 Pain location: low back  Pain description: sore  Aggravating factors: movement  Relieving factors: rest   PRECAUTIONS: Fall  RED FLAGS: None   WEIGHT BEARING RESTRICTIONS: No  FALLS: Has patient fallen in last 6 months? No  LIVING ENVIRONMENT: Lives with: lives alone Lives in: House/apartment Stairs: Yes: External: 1 steps; none Has following equipment at home: Walker - 4 wheeled  PLOF: Independent with household mobility with device and Independent with community mobility with device  PATIENT GOALS: improved strength in BLE   OBJECTIVE:   DIAGNOSTIC FINDINGS:  06/12/2023 EXAM: CT CHEST, ABDOMEN, AND PELVIS WITH CONTRAST IMPRESSION: 1. Status post right hemicolectomy, without recurrent or metastatic disease. Decreased sensitivity exam, primarily involving the abdomen pelvis, due to paucity of fat. 2. New tiny left groin hernia containing fluid. Consider physical exam correlation. 3. Incidental findings, including: Coronary artery atherosclerosis. Aortic  Atherosclerosis (ICD10-I70.0). Dilated esophagus with contrast throughout, suggesting dysmotility and/or gastroesophageal reflux.  COGNITION: Overall cognitive status: Within functional limits for tasks assessed   SENSATION: Light touch: Impaired  mild paraesthesia in the proximal RLE   COORDINATION: WFL. Ankle to knee limited ROM due to weakness in the R hip  POSTURE: rounded shoulders, forward head, flexed trunk , and severe scoliosis     LOWER EXTREMITY MMT:    MMT Right Eval Left Eval  Hip flexion 4- 4  Hip extension    Hip abduction 4- 44  Hip adduction 4 4  Hip internal rotation    Hip external rotation    Knee flexion 4- 4  Knee extension 4 4+  Ankle dorsiflexion 4 4+  Ankle plantarflexion    Ankle inversion    Ankle eversion    (Blank rows = not tested)    TRANSFERS: Assistive device utilized: Environmental consultant - 4 wheeled  Sit to stand: SBA Stand to sit: SBA Chair to chair: SBA Floor:  Unable to perform    STAIRS: Level of Assistance:  pt declines Stair Negotiation Technique: Step to Pattern with Bilateral Rails Number of Stairs: 4  Height of Stairs: 6  Comments: will need to assess.   GAIT: Gait pattern: step through pattern, Right foot flat, and lateral hip instability Distance walked: 288ft Assistive device utilized: Walker - 4 wheeled Level of assistance: Modified independence and SBA Comments: severe scoliosis.    FUNCTIONAL TESTS:  5 times sit to stand: 30.86 Timed up and go (TUG): 29.39 2 minute walk test: 265ft  10 meter walk test: 0.687m/s, SOB 4-5.  Berg Balance Scale: TBD  PATIENT SURVEYS:  FOTO 56. goal 64  TODAY'S TREATMENT:                                                                                                                              DATE: 10/05/2023   Therex Sit to stand with minimal UE support x 12 reps Seated LAQ without ankle weights- AROM- 2 sets of 10 reps Seated lateral step up/over yoga block - 12 reps  each LE Step up alt LE x 15 reps each  NMR:   Static stand with feet staggered (1 LE up on 6" block and back leg on floor) - hold 15 sec x 3 each direction (VC to keep core engaged to avoid LBP) Static stand with feet staggered (1 LE up on 6" block and back leg on floor) with head turns x 5 each  Static stand with feet narrowed Eyes open) x 60 sec  Step tap with minimal UE support x 15 reps each LE   Cues form PT for proper speed of movement and ROm as listed below. No hip pain reported on this day. Patient provided brief rest breaks as needed.   PATIENT EDUCATION: Education details: Pt educated throughout session about proper posture and technique with exercises. Improved exercise technique, movement at target joints, use of target muscles after min to mod verbal, visual, tactile cues.  Person educated: Patient Education method: Explanation Education comprehension: verbalized understanding and returned demonstration  HOME EXERCISE PROGRAM: Access Code: [email or web address] URL: [email or web address] Date: 09/25/2023 Prepared by: Grier Rocher  Exercises - Seated Hip Abduction with Resistance  - 1 x daily - 4  x weekly - 3 sets - 15 reps - 3 hold - Seated March with Resistance  - 1 x daily - 4 x weekly - 3 sets - 10 reps - 1 hold - Seated Long Arc Quad  - 1 x daily - 4 x weekly - 3 sets - 12 reps - 3 hold - Seated Hip Adduction Isometrics with Ball  - 1 x daily - 4 x weekly - 3 sets - 12 reps - 3 hold - Standing March with Counter Support  - 1 x daily - 3 x weekly - 2 sets - 10 reps - Standing Hip Abduction with Counter Support  - 1 x daily - 3 x weekly - 2 sets - 10 reps - Standing Hip Extension with Counter Support  - 1 x daily - 3 x weekly - 2 sets - 10 reps - Mini Squat with Counter Support  - 1 x daily - 3 x weekly - 2 sets - 10 reps - Standing Knee Flexion with Counter Support  - 1 x daily - 3 x weekly - 2 sets - 10 reps - Standing Tandem Balance with Counter Support  - 1 x  daily - 3 x weekly - 2 sets - 4 reps - 10 hold  GOALS: Goals reviewed with patient? Yes   SHORT TERM GOALS: Target date: 09/19/2023    Patient will be independent in home exercise program to improve strength/mobility for better functional independence with ADLs. Baseline:to be given at next session  Goal status: INITIAL   LONG TERM GOALS: Target date: 11/07/2023    Patient will increase FOTO score to equal to or greater than  61   to demonstrate statistically significant improvement in mobility and quality of life.  Baseline: 56 Goal status: INITIAL  2.  Patient (> 43 years old) will complete five times sit to stand test in < 15 seconds indicating an increased LE strength and improved balance. Baseline: 30.86 sec Goal status: INITIAL  3.  Patient will increase Berg Balance score by > 6 points to demonstrate decreased fall risk during functional activities  Baseline: 26 Goal status: INITIAL  4.  Patient will increase 10 meter walk test to >1.63m/s as to improve gait speed for better community ambulation and to reduce fall risk. Baseline: 0.681m/s Goal status: INITIAL  5.  Patient will reduce timed up and go to <11 seconds to reduce fall risk and demonstrate improved transfer/gait ability. Baseline: 29.39 Goal status: INITIAL  6.  Patient will 2 min walk test by at least 26ft as to demonstrate reduced fall risk and improved dynamic gait balance for better safety with community/home ambulation.   Baseline: 230ft Goal status: INITIAL   ASSESSMENT: CLINICAL IMPRESSION: Patient presented with good overall motivation for today's session. Treatment continued with LE strengthening as pain free as possible. She required rest breaks more due to fatigue than any increased pain. Continued cues for core engagement and for proper exercise technique to reduce risk of injury. Pt will continue to benefit from skilled physical therapy intervention to address impairments, improve QOL, and  attain therapy goals.     OBJECTIVE IMPAIRMENTS: Abnormal gait, cardiopulmonary status limiting activity, decreased activity tolerance, decreased balance, decreased endurance, decreased mobility, difficulty walking, decreased ROM, decreased strength, decreased safety awareness, hypomobility, increased fascial restrictions, impaired flexibility, improper body mechanics, and postural dysfunction.   ACTIVITY LIMITATIONS: lifting, standing, squatting, transfers, and locomotion level  PARTICIPATION LIMITATIONS: laundry, shopping, and community activity  PERSONAL FACTORS: 3+ comorbidities: scoliosis, hx of  CA, and PE  are also affecting patient's functional outcome.   REHAB POTENTIAL: Good  CLINICAL DECISION MAKING: Stable/uncomplicated  EVALUATION COMPLEXITY: Moderate  PLAN:  PT FREQUENCY: 1-2x/week  PT DURATION: 12 weeks  PLANNED INTERVENTIONS: Therapeutic exercises, Therapeutic activity, Neuromuscular re-education, Balance training, Gait training, Patient/Family education, Self Care, Joint mobilization, Stair training, Vestibular training, DME instructions, Dry Needling, Spinal mobilization, Cryotherapy, Moist heat, and Manual therapy  PLAN FOR NEXT SESSION:   Dynamic balance training BLE strengthening.   Lenda Kelp, PT 10/05/2023, 2:14 PM

## 2023-10-11 ENCOUNTER — Ambulatory Visit: Payer: Medicare Other | Admitting: Physical Therapy

## 2023-10-11 ENCOUNTER — Encounter: Payer: Self-pay | Admitting: Physical Therapy

## 2023-10-11 DIAGNOSIS — R2681 Unsteadiness on feet: Secondary | ICD-10-CM

## 2023-10-11 DIAGNOSIS — M5459 Other low back pain: Secondary | ICD-10-CM

## 2023-10-11 DIAGNOSIS — M6281 Muscle weakness (generalized): Secondary | ICD-10-CM | POA: Diagnosis not present

## 2023-10-11 DIAGNOSIS — R2689 Other abnormalities of gait and mobility: Secondary | ICD-10-CM

## 2023-10-11 DIAGNOSIS — R262 Difficulty in walking, not elsewhere classified: Secondary | ICD-10-CM

## 2023-10-11 NOTE — Therapy (Signed)
OUTPATIENT PHYSICAL THERAPY NEURO TREATMENT NOTE   Patient Name: Kristina Rowe MRN: [id number] DOB:06-18-1930, 89 y.o., female Today's Date: 10/11/2023   PCP: Enid Baas, MD  REFERRING PROVIDER:   Ignacia Bayley, PA-C    END OF SESSION:      Past Medical History:  Diagnosis Date   Ductal carcinoma in situ (DCIS) of left breast    Dysphagia    Esophageal stricture    Gastroparesis    GERD (gastroesophageal reflux disease)    Hiatal hernia    Hypertension    Iron deficiency anemia    Osteoporosis    Pulmonary embolism (HCC)    Scoliosis    UTI (urinary tract infection)    Past Surgical History:  Procedure Laterality Date   BREAST LUMPECTOMY Left 2009   Ductal Carcinoma insitu    CATARACT EXTRACTION, BILATERAL     COLONOSCOPY N/A 03/17/2021   Procedure: COLONOSCOPY;  Surgeon: Regis Bill, MD;  Location: [facility];  Service: Endoscopy;  Laterality: N/A;   COLOSTOMY REVISION Right 03/18/2021   Procedure: COLON RESECTION RIGHT;  Surgeon: Henrene Dodge, MD;  Location: [facility];  Service: General;  Laterality: Right;   LAPAROSCOPIC PARAESOPHAGEAL HERNIA REPAIR  2009   LAPAROTOMY N/A 03/10/2021   Procedure: EXPLORATORY LAPAROTOMY;  Surgeon: Henrene Dodge, MD;  Location: [facility];  Service: General;  Laterality: N/A;   TUBAL LIGATION     Patient Active Problem List   Diagnosis Date Noted   Acute on chronic diastolic CHF (congestive heart failure) (HCC) 08/19/2023   Acute respiratory failure with hypoxia (HCC) 08/17/2023   History of pulmonary embolus (PE) 08/17/2023   Basal cell carcinoma of leg 04/01/2021   Coronary atherosclerosis 04/01/2021   History of pulmonary embolism 04/01/2021   Pulmonary nodule 04/01/2021   Malignant neoplasm of colon (HCC) 03/29/2021   Rectal bleeding 03/15/2021   Hypertension    GERD (gastroesophageal reflux disease)    Colonic mass    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HCC) 03/10/2021   Anemia, unspecified 03/06/2021    Bronchiectasis without complication (HCC) 03/27/2020   Iron deficiency anemia 03/27/2020   Moderate aortic stenosis 03/27/2020   Stage 3a chronic kidney disease (HCC) 07/04/2019   Hypnic headache 05/28/2019   Telogen effluvium 04/17/2019   Xerosis cutis 04/17/2019   Essential hypertension 01/14/2019   Cervical radiculopathy 05/04/2018   Osteoarthritis of left glenohumeral joint 05/04/2018   Vitamin B12 deficiency 01/01/2018   Vascular abnormality 09/11/2017   Dyspepsia 03/28/2017   Pulmonary embolism (HCC) 08/16/2016   Dilated pore of [place] 03/23/2015   Retinal drusen of both eyes 08/28/2014   Status post right cataract extraction 07/30/2014   Verruca 03/19/2014   Chronic back pain 11/06/2013   Postmenopausal osteoporosis 06/11/2013   Anticoagulated on Coumadin 04/06/2013   Long term (current) use of anticoagulants 10/18/2012   Preglaucoma 05/10/2012   Presbyopia 05/10/2012   Senile nuclear sclerosis 05/10/2012   Lobular carcinoma in situ of breast 04/13/2012   Closed fracture of ankle 02/27/2012   History of nonmelanoma skin cancer 09/05/2011   Other benign neoplasm of skin of trunk 09/05/2011   Osteoporosis 08/25/2011   Transient alteration of awareness 09/03/2009   Colon adenoma 07/30/2002    ONSET DATE: 3 months   REFERRING DIAG:  Diagnosis  R53.83 (ICD-10-CM) - Other fatigue    THERAPY DIAG:  Muscle weakness (generalized)  Difficulty in walking, not elsewhere classified  Unsteadiness on feet  Other abnormalities of gait and mobility  Other low back pain  Rationale for Evaluation  and Treatment: Rehabilitation  SUBJECTIVE:                                                                                                                                                                                             SUBJECTIVE STATEMENT: Patient reports no increased pain after last session.    Pt accompanied by: family member  PERTINENT HISTORY:  From recent MD  visit.  89 y.o. here for an acute issue. She has a PMH of chronic PE/Coumadin, anemia, colon cancer, breast cancer, large hiatal hernia, moderate aortic stenosis who has concerns about shortness of breath, nausea, generalized weakness. No chest pain. History of hiatal hernia and recent CT chest. Also recent endoscopy. Takes Zofran chronically. Has felt weaker and almost fell recently. Uses a walker. Also on chronic UTI suppression, Keflex.   PAIN:  Are you having pain? Yes: NPRS scale: 3/10 Pain location: low back  Pain description: sore  Aggravating factors: movement  Relieving factors: rest   PRECAUTIONS: Fall  RED FLAGS: None   WEIGHT BEARING RESTRICTIONS: No  FALLS: Has patient fallen in last 6 months? No  LIVING ENVIRONMENT: Lives with: lives alone Lives in: House/apartment Stairs: Yes: External: 1 steps; none Has following equipment at home: Walker - 4 wheeled  PLOF: Independent with household mobility with device and Independent with community mobility with device  PATIENT GOALS: improved strength in BLE   OBJECTIVE:   DIAGNOSTIC FINDINGS:  06/12/2023 EXAM: CT CHEST, ABDOMEN, AND PELVIS WITH CONTRAST IMPRESSION: 1. Status post right hemicolectomy, without recurrent or metastatic disease. Decreased sensitivity exam, primarily involving the abdomen pelvis, due to paucity of fat. 2. New tiny left groin hernia containing fluid. Consider physical exam correlation. 3. Incidental findings, including: Coronary artery atherosclerosis. Aortic Atherosclerosis (ICD10-I70.0). Dilated esophagus with contrast throughout, suggesting dysmotility and/or gastroesophageal reflux.  COGNITION: Overall cognitive status: Within functional limits for tasks assessed   SENSATION: Light touch: Impaired  mild paraesthesia in the proximal RLE   COORDINATION: WFL. Ankle to knee limited ROM due to weakness in the R hip      POSTURE: rounded shoulders, forward head, flexed trunk , and  severe scoliosis     LOWER EXTREMITY MMT:    MMT Right Eval Left Eval  Hip flexion 4- 4  Hip extension    Hip abduction 4- 44  Hip adduction 4 4  Hip internal rotation    Hip external rotation    Knee flexion 4- 4  Knee extension 4 4+  Ankle dorsiflexion 4 4+  Ankle plantarflexion    Ankle inversion    Ankle eversion    (Blank rows = not  tested)    TRANSFERS: Assistive device utilized: Environmental consultant - 4 wheeled  Sit to stand: SBA Stand to sit: SBA Chair to chair: SBA Floor:  Unable to perform    STAIRS: Level of Assistance:  pt declines Stair Negotiation Technique: Step to Pattern with Bilateral Rails Number of Stairs: 4  Height of Stairs: 6  Comments: will need to assess.   GAIT: Gait pattern: step through pattern, Right foot flat, and lateral hip instability Distance walked: 228ft Assistive device utilized: Walker - 4 wheeled Level of assistance: Modified independence and SBA Comments: severe scoliosis.    FUNCTIONAL TESTS:  5 times sit to stand: 30.86 Timed up and go (TUG): 29.39 2 minute walk test: 253ft  10 meter walk test: 0.648m/s, SOB 4-5.  Berg Balance Scale: TBD  PATIENT SURVEYS:  FOTO 56. goal 64  TODAY'S TREATMENT:                                                                                                                              DATE: 10/11/2023   Therex Sit to stand with minimal UE support 2*10 reps Seated LAQ without ankle weights- AROM- 2 sets of 10 reps  Seated lateral step up 1/2 foam with 2.5# AW - x 5 ea LE, x 8 ea LE reps each LE Step up alt LE x 15 reps each  NMR:   Static stand with feet staggered (1 LE up on 6" block and back leg on floor) - hold 25 sec x 2 each direction (VC to keep core engaged to avoid LBP) Static stand with feet staggered (1 LE up on 6" block and back leg on floor) with head turns x 2 each x 30 sec ea side Step tap with minimal UE support 3*5 reps  reps each LE  Cues form PT for proper speed of  movement and ROm as listed below. No hip pain reported on this day. Patient provided brief rest breaks as needed.   PATIENT EDUCATION: Education details: Pt educated throughout session about proper posture and technique with exercises. Improved exercise technique, movement at target joints, use of target muscles after min to mod verbal, visual, tactile cues.  Person educated: Patient Education method: Explanation Education comprehension: verbalized understanding and returned demonstration  HOME EXERCISE PROGRAM: Access Code: [email or web address] URL: [email or web address] Date: 09/25/2023 Prepared by: Grier Rocher  Exercises - Seated Hip Abduction with Resistance  - 1 x daily - 4 x weekly - 3 sets - 15 reps - 3 hold - Seated March with Resistance  - 1 x daily - 4 x weekly - 3 sets - 10 reps - 1 hold - Seated Long Arc Quad  - 1 x daily - 4 x weekly - 3 sets - 12 reps - 3 hold - Seated Hip Adduction Isometrics with Ball  - 1 x daily - 4 x weekly - 3 sets - 12 reps - 3 hold - Standing March with Counter Support  -  1 x daily - 3 x weekly - 2 sets - 10 reps - Standing Hip Abduction with Counter Support  - 1 x daily - 3 x weekly - 2 sets - 10 reps - Standing Hip Extension with Counter Support  - 1 x daily - 3 x weekly - 2 sets - 10 reps - Mini Squat with Counter Support  - 1 x daily - 3 x weekly - 2 sets - 10 reps - Standing Knee Flexion with Counter Support  - 1 x daily - 3 x weekly - 2 sets - 10 reps - Standing Tandem Balance with Counter Support  - 1 x daily - 3 x weekly - 2 sets - 4 reps - 10 hold  GOALS: Goals reviewed with patient? Yes   SHORT TERM GOALS: Target date: 09/19/2023    Patient will be independent in home exercise program to improve strength/mobility for better functional independence with ADLs. Baseline:to be given at next session  Goal status: INITIAL   LONG TERM GOALS: Target date: 11/07/2023    Patient will increase FOTO score to equal to or greater than   61   to demonstrate statistically significant improvement in mobility and quality of life.  Baseline: 56 Goal status: INITIAL  2.  Patient (> 5 years old) will complete five times sit to stand test in < 15 seconds indicating an increased LE strength and improved balance. Baseline: 30.86 sec Goal status: INITIAL  3.  Patient will increase Berg Balance score by > 6 points to demonstrate decreased fall risk during functional activities  Baseline: 26 Goal status: INITIAL  4.  Patient will increase 10 meter walk test to >1.75m/s as to improve gait speed for better community ambulation and to reduce fall risk. Baseline: 0.684m/s Goal status: INITIAL  5.  Patient will reduce timed up and go to <11 seconds to reduce fall risk and demonstrate improved transfer/gait ability. Baseline: 29.39 Goal status: INITIAL  6.  Patient will 2 min walk test by at least 12ft as to demonstrate reduced fall risk and improved dynamic gait balance for better safety with community/home ambulation.   Baseline: 259ft Goal status: INITIAL   ASSESSMENT: CLINICAL IMPRESSION:  Patient presented with good overall motivation for today's session. Treatment continued with LE strengthening and balance training to improve patients safety and ease with mobility. She required rest breaks more due to fatigue throughout session. Pt a little tired this date secondary to other morning activities, pt did progress with weight and resistance with some exercises but reps were decreased to account for this.  Pt will continue to benefit from skilled physical therapy intervention to address impairments, improve QOL, and attain therapy goals.     OBJECTIVE IMPAIRMENTS: Abnormal gait, cardiopulmonary status limiting activity, decreased activity tolerance, decreased balance, decreased endurance, decreased mobility, difficulty walking, decreased ROM, decreased strength, decreased safety awareness, hypomobility, increased fascial  restrictions, impaired flexibility, improper body mechanics, and postural dysfunction.   ACTIVITY LIMITATIONS: lifting, standing, squatting, transfers, and locomotion level  PARTICIPATION LIMITATIONS: laundry, shopping, and community activity  PERSONAL FACTORS: 3+ comorbidities: scoliosis, hx of CA, and PE  are also affecting patient's functional outcome.   REHAB POTENTIAL: Good  CLINICAL DECISION MAKING: Stable/uncomplicated  EVALUATION COMPLEXITY: Moderate  PLAN:  PT FREQUENCY: 1-2x/week  PT DURATION: 12 weeks  PLANNED INTERVENTIONS: Therapeutic exercises, Therapeutic activity, Neuromuscular re-education, Balance training, Gait training, Patient/Family education, Self Care, Joint mobilization, Stair training, Vestibular training, DME instructions, Dry Needling, Spinal mobilization, Cryotherapy, Moist heat, and Manual therapy  PLAN FOR NEXT SESSION:   Dynamic balance training BLE strengthening.   Norman Herrlich, PT 10/11/2023, 1:20 PM

## 2023-10-17 ENCOUNTER — Ambulatory Visit: Payer: Medicare Other | Admitting: Physical Therapy

## 2023-10-17 DIAGNOSIS — R2681 Unsteadiness on feet: Secondary | ICD-10-CM

## 2023-10-17 DIAGNOSIS — M6281 Muscle weakness (generalized): Secondary | ICD-10-CM

## 2023-10-17 DIAGNOSIS — R2689 Other abnormalities of gait and mobility: Secondary | ICD-10-CM

## 2023-10-17 DIAGNOSIS — R262 Difficulty in walking, not elsewhere classified: Secondary | ICD-10-CM

## 2023-10-17 DIAGNOSIS — M5459 Other low back pain: Secondary | ICD-10-CM

## 2023-10-17 DIAGNOSIS — R278 Other lack of coordination: Secondary | ICD-10-CM

## 2023-10-17 NOTE — Therapy (Signed)
OUTPATIENT PHYSICAL THERAPY NEURO TREATMENT NOTE   Patient Name: Kristina Rowe MRN: [id number] DOB:01-Feb-1930, 89 y.o., female Today's Date: 10/17/2023   PCP: Enid Baas, MD  REFERRING PROVIDER:   Ignacia Bayley, PA-C    END OF SESSION:  PT End of Session - 10/17/23 1450     Visit Number 9    Number of Visits 24    Date for PT Re-Evaluation 11/07/23    PT Start Time 1451    PT Stop Time 1530    PT Time Calculation (min) 39 min    Equipment Utilized During Treatment Gait belt    Activity Tolerance Patient tolerated treatment well;No increased pain    Behavior During Therapy WFL for tasks assessed/performed                Past Medical History:  Diagnosis Date   Ductal carcinoma in situ (DCIS) of left breast    Dysphagia    Esophageal stricture    Gastroparesis    GERD (gastroesophageal reflux disease)    Hiatal hernia    Hypertension    Iron deficiency anemia    Osteoporosis    Pulmonary embolism (HCC)    Scoliosis    UTI (urinary tract infection)    Past Surgical History:  Procedure Laterality Date   BREAST LUMPECTOMY Left 2009   Ductal Carcinoma insitu    CATARACT EXTRACTION, BILATERAL     COLONOSCOPY N/A 03/17/2021   Procedure: COLONOSCOPY;  Surgeon: Regis Bill, MD;  Location: [facility];  Service: Endoscopy;  Laterality: N/A;   COLOSTOMY REVISION Right 03/18/2021   Procedure: COLON RESECTION RIGHT;  Surgeon: Henrene Dodge, MD;  Location: [facility];  Service: General;  Laterality: Right;   LAPAROSCOPIC PARAESOPHAGEAL HERNIA REPAIR  2009   LAPAROTOMY N/A 03/10/2021   Procedure: EXPLORATORY LAPAROTOMY;  Surgeon: Henrene Dodge, MD;  Location: [facility];  Service: General;  Laterality: N/A;   TUBAL LIGATION     Patient Active Problem List   Diagnosis Date Noted   Acute on chronic diastolic CHF (congestive heart failure) (HCC) 08/19/2023   Acute respiratory failure with hypoxia (HCC) 08/17/2023   History of pulmonary embolus (PE)  08/17/2023   Basal cell carcinoma of leg 04/01/2021   Coronary atherosclerosis 04/01/2021   History of pulmonary embolism 04/01/2021   Pulmonary nodule 04/01/2021   Malignant neoplasm of colon (HCC) 03/29/2021   Rectal bleeding 03/15/2021   Hypertension    GERD (gastroesophageal reflux disease)    Colonic mass    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HCC) 03/10/2021   Anemia, unspecified 03/06/2021   Bronchiectasis without complication (HCC) 03/27/2020   Iron deficiency anemia 03/27/2020   Moderate aortic stenosis 03/27/2020   Stage 3a chronic kidney disease (HCC) 07/04/2019   Hypnic headache 05/28/2019   Telogen effluvium 04/17/2019   Xerosis cutis 04/17/2019   Essential hypertension 01/14/2019   Cervical radiculopathy 05/04/2018   Osteoarthritis of left glenohumeral joint 05/04/2018   Vitamin B12 deficiency 01/01/2018   Vascular abnormality 09/11/2017   Dyspepsia 03/28/2017   Pulmonary embolism (HCC) 08/16/2016   Dilated pore of [place] 03/23/2015   Retinal drusen of both eyes 08/28/2014   Status post right cataract extraction 07/30/2014   Verruca 03/19/2014   Chronic back pain 11/06/2013   Postmenopausal osteoporosis 06/11/2013   Anticoagulated on Coumadin 04/06/2013   Long term (current) use of anticoagulants 10/18/2012   Preglaucoma 05/10/2012   Presbyopia 05/10/2012   Senile nuclear sclerosis 05/10/2012   Lobular carcinoma in situ of breast 04/13/2012   Closed  fracture of ankle 02/27/2012   History of nonmelanoma skin cancer 09/05/2011   Other benign neoplasm of skin of trunk 09/05/2011   Osteoporosis 08/25/2011   Transient alteration of awareness 09/03/2009   Colon adenoma 07/30/2002    ONSET DATE: 3 months   REFERRING DIAG:  Diagnosis  R53.83 (ICD-10-CM) - Other fatigue    THERAPY DIAG:  Muscle weakness (generalized)  Difficulty in walking, not elsewhere classified  Unsteadiness on feet  Other abnormalities of gait and mobility  Other low back pain  Other lack  of coordination  Rationale for Evaluation and Treatment: Rehabilitation  SUBJECTIVE:                                                                                                                                                                                             SUBJECTIVE STATEMENT: Patient reports that she is doing well. Was able to watch football and basketball over the weekend. Chronic LBP, but no change from baseline, 5-6/10  Pt accompanied by: family member  PERTINENT HISTORY:  From recent MD visit.  89 y.o. here for an acute issue. She has a PMH of chronic PE/Coumadin, anemia, colon cancer, breast cancer, large hiatal hernia, moderate aortic stenosis who has concerns about shortness of breath, nausea, generalized weakness. No chest pain. History of hiatal hernia and recent CT chest. Also recent endoscopy. Takes Zofran chronically. Has felt weaker and almost fell recently. Uses a walker. Also on chronic UTI suppression, Keflex.   PAIN:  Are you having pain? Yes: NPRS scale: 3/10 Pain location: low back  Pain description: sore  Aggravating factors: movement  Relieving factors: rest   PRECAUTIONS: Fall  RED FLAGS: None   WEIGHT BEARING RESTRICTIONS: No  FALLS: Has patient fallen in last 6 months? No  LIVING ENVIRONMENT: Lives with: lives alone Lives in: House/apartment Stairs: Yes: External: 1 steps; none Has following equipment at home: Walker - 4 wheeled  PLOF: Independent with household mobility with device and Independent with community mobility with device  PATIENT GOALS: improved strength in BLE   OBJECTIVE:   DIAGNOSTIC FINDINGS:  06/12/2023 EXAM: CT CHEST, ABDOMEN, AND PELVIS WITH CONTRAST IMPRESSION: 1. Status post right hemicolectomy, without recurrent or metastatic disease. Decreased sensitivity exam, primarily involving the abdomen pelvis, due to paucity of fat. 2. New tiny left groin hernia containing fluid. Consider physical exam  correlation. 3. Incidental findings, including: Coronary artery atherosclerosis. Aortic Atherosclerosis (ICD10-I70.0). Dilated esophagus with contrast throughout, suggesting dysmotility and/or gastroesophageal reflux.  COGNITION: Overall cognitive status: Within functional limits for tasks assessed   SENSATION: Light touch: Impaired  mild paraesthesia in the proximal RLE  COORDINATION: WFL. Ankle to knee limited ROM due to weakness in the R hip      POSTURE: rounded shoulders, forward head, flexed trunk , and severe scoliosis     LOWER EXTREMITY MMT:    MMT Right Eval Left Eval  Hip flexion 4- 4  Hip extension    Hip abduction 4- 44  Hip adduction 4 4  Hip internal rotation    Hip external rotation    Knee flexion 4- 4  Knee extension 4 4+  Ankle dorsiflexion 4 4+  Ankle plantarflexion    Ankle inversion    Ankle eversion    (Blank rows = not tested)    TRANSFERS: Assistive device utilized: Environmental consultant - 4 wheeled  Sit to stand: SBA Stand to sit: SBA Chair to chair: SBA Floor:  Unable to perform    STAIRS: Level of Assistance:  pt declines Stair Negotiation Technique: Step to Pattern with Bilateral Rails Number of Stairs: 4  Height of Stairs: 6  Comments: will need to assess.   GAIT: Gait pattern: step through pattern, Right foot flat, and lateral hip instability Distance walked: 245ft Assistive device utilized: Walker - 4 wheeled Level of assistance: Modified independence and SBA Comments: severe scoliosis.    FUNCTIONAL TESTS:  5 times sit to stand: 30.86 Timed up and go (TUG): 29.39 2 minute walk test: 252ft  10 meter walk test: 0.626m/s, SOB 4-5.  Berg Balance Scale: TBD  PATIENT SURVEYS:  FOTO 56. goal 64  TODAY'S TREATMENT:                                                                                                                              DATE: 10/17/2023   Therex Sit to stand with minimal UE support 2x10 reps Seated LAQ 2.5  AW x 12  Hip flexion x 12 2.5# AW Hip abduction x 12 RTB Ankle PF/DF x 20 2.5# AW HS curl 2 x 12 RTB   Gait with 2.5# AW x 48ft with Ue supported on Rollator.    Stair ascent/descent 8 x 2 with BUE support and cues for safety in turns at top and bottom of stairs.   Cues form PT for proper speed of movement and ROM as listed below. No hip pain reported on this day. Patient provided brief rest breaks as needed.   PATIENT EDUCATION: Education details: Pt educated throughout session about proper posture and technique with exercises. Improved exercise technique, movement at target joints, use of target muscles after min to mod verbal, visual, tactile cues.  Person educated: Patient Education method: Explanation Education comprehension: verbalized understanding and returned demonstration  HOME EXERCISE PROGRAM: Access Code: [email or web address] URL: [email or web address] Date: 09/25/2023 Prepared by: Grier Rocher  Exercises - Seated Hip Abduction with Resistance  - 1 x daily - 4 x weekly - 3 sets - 15 reps - 3 hold - Seated March with Resistance  - 1 x daily - 4 x weekly - 3  sets - 10 reps - 1 hold - Seated Long Arc Quad  - 1 x daily - 4 x weekly - 3 sets - 12 reps - 3 hold - Seated Hip Adduction Isometrics with Ball  - 1 x daily - 4 x weekly - 3 sets - 12 reps - 3 hold - Standing March with Counter Support  - 1 x daily - 3 x weekly - 2 sets - 10 reps - Standing Hip Abduction with Counter Support  - 1 x daily - 3 x weekly - 2 sets - 10 reps - Standing Hip Extension with Counter Support  - 1 x daily - 3 x weekly - 2 sets - 10 reps - Mini Squat with Counter Support  - 1 x daily - 3 x weekly - 2 sets - 10 reps - Standing Knee Flexion with Counter Support  - 1 x daily - 3 x weekly - 2 sets - 10 reps - Standing Tandem Balance with Counter Support  - 1 x daily - 3 x weekly - 2 sets - 4 reps - 10 hold  GOALS: Goals reviewed with patient? Yes   SHORT TERM GOALS: Target date:  09/19/2023    Patient will be independent in home exercise program to improve strength/mobility for better functional independence with ADLs. Baseline:to be given at next session  Goal status: INITIAL   LONG TERM GOALS: Target date: 11/07/2023    Patient will increase FOTO score to equal to or greater than  61   to demonstrate statistically significant improvement in mobility and quality of life.  Baseline: 56 Goal status: INITIAL  2.  Patient (> 103 years old) will complete five times sit to stand test in < 15 seconds indicating an increased LE strength and improved balance. Baseline: 30.86 sec Goal status: INITIAL  3.  Patient will increase Berg Balance score by > 6 points to demonstrate decreased fall risk during functional activities  Baseline: 26 Goal status: INITIAL  4.  Patient will increase 10 meter walk test to >1.81m/s as to improve gait speed for better community ambulation and to reduce fall risk. Baseline: 0.657m/s Goal status: INITIAL  5.  Patient will reduce timed up and go to <11 seconds to reduce fall risk and demonstrate improved transfer/gait ability. Baseline: 29.39 Goal status: INITIAL  6.  Patient will 2 min walk test by at least 25ft as to demonstrate reduced fall risk and improved dynamic gait balance for better safety with community/home ambulation.   Baseline: 243ft Goal status: INITIAL   ASSESSMENT: CLINICAL IMPRESSION:  Patient presented with good overall motivation for today's session. Treatment continued with LE strengthening and balance training to improve patients safety and ease with mobility.Pt tolerated increased resistance with AW and Red theraband as well as ability to navigate up to 8 stairs for 2 bouts on this day.  Pt will continue to benefit from skilled physical therapy intervention to address impairments, improve QOL, and attain therapy goals.     OBJECTIVE IMPAIRMENTS: Abnormal gait, cardiopulmonary status limiting activity,  decreased activity tolerance, decreased balance, decreased endurance, decreased mobility, difficulty walking, decreased ROM, decreased strength, decreased safety awareness, hypomobility, increased fascial restrictions, impaired flexibility, improper body mechanics, and postural dysfunction.   ACTIVITY LIMITATIONS: lifting, standing, squatting, transfers, and locomotion level  PARTICIPATION LIMITATIONS: laundry, shopping, and community activity  PERSONAL FACTORS: 3+ comorbidities: scoliosis, hx of CA, and PE  are also affecting patient's functional outcome.   REHAB POTENTIAL: Good  CLINICAL DECISION MAKING: Stable/uncomplicated  EVALUATION  COMPLEXITY: Moderate  PLAN:  PT FREQUENCY: 1-2x/week  PT DURATION: 12 weeks  PLANNED INTERVENTIONS: Therapeutic exercises, Therapeutic activity, Neuromuscular re-education, Balance training, Gait training, Patient/Family education, Self Care, Joint mobilization, Stair training, Vestibular training, DME instructions, Dry Needling, Spinal mobilization, Cryotherapy, Moist heat, and Manual therapy  PLAN FOR NEXT SESSION:   Dynamic balance training    Golden Pop, PT 10/17/2023, 2:51 PM

## 2023-10-19 ENCOUNTER — Ambulatory Visit: Payer: Medicare Other | Admitting: Physical Therapy

## 2023-10-23 ENCOUNTER — Ambulatory Visit: Payer: Medicare Other

## 2023-10-23 DIAGNOSIS — R278 Other lack of coordination: Secondary | ICD-10-CM

## 2023-10-23 DIAGNOSIS — M6281 Muscle weakness (generalized): Secondary | ICD-10-CM | POA: Diagnosis not present

## 2023-10-23 DIAGNOSIS — R2689 Other abnormalities of gait and mobility: Secondary | ICD-10-CM

## 2023-10-23 DIAGNOSIS — R2681 Unsteadiness on feet: Secondary | ICD-10-CM

## 2023-10-23 DIAGNOSIS — R262 Difficulty in walking, not elsewhere classified: Secondary | ICD-10-CM

## 2023-10-23 DIAGNOSIS — M5459 Other low back pain: Secondary | ICD-10-CM

## 2023-10-23 NOTE — Therapy (Signed)
OUTPATIENT PHYSICAL THERAPY NEURO TREATMENT NOTE/ Physical Therapy Progress Note   Dates of reporting period  08/15/2023   to   10/23/2023   Patient Name: Kristina Rowe MRN: [id number] DOB:July 09, 1930, 89 y.o., female Today's Date: 10/24/2023   PCP: Enid Baas, MD  REFERRING PROVIDER:   Ignacia Bayley, PA-C    END OF SESSION:  PT End of Session - 10/23/23 1404     Visit Number 10    Number of Visits 24    Date for PT Re-Evaluation 11/07/23    Progress Note Due on Visit 20    PT Start Time 1405    PT Stop Time 1448    PT Time Calculation (min) 43 min    Equipment Utilized During Treatment Gait belt    Activity Tolerance Patient tolerated treatment well;No increased pain    Behavior During Therapy WFL for tasks assessed/performed                Past Medical History:  Diagnosis Date   Ductal carcinoma in situ (DCIS) of left breast    Dysphagia    Esophageal stricture    Gastroparesis    GERD (gastroesophageal reflux disease)    Hiatal hernia    Hypertension    Iron deficiency anemia    Osteoporosis    Pulmonary embolism (HCC)    Scoliosis    UTI (urinary tract infection)    Past Surgical History:  Procedure Laterality Date   BREAST LUMPECTOMY Left 2009   Ductal Carcinoma insitu    CATARACT EXTRACTION, BILATERAL     COLONOSCOPY N/A 03/17/2021   Procedure: COLONOSCOPY;  Surgeon: Regis Bill, MD;  Location: [facility];  Service: Endoscopy;  Laterality: N/A;   COLOSTOMY REVISION Right 03/18/2021   Procedure: COLON RESECTION RIGHT;  Surgeon: Henrene Dodge, MD;  Location: [facility];  Service: General;  Laterality: Right;   LAPAROSCOPIC PARAESOPHAGEAL HERNIA REPAIR  2009   LAPAROTOMY N/A 03/10/2021   Procedure: EXPLORATORY LAPAROTOMY;  Surgeon: Henrene Dodge, MD;  Location: [facility];  Service: General;  Laterality: N/A;   TUBAL LIGATION     Patient Active Problem List   Diagnosis Date Noted   Acute on chronic diastolic CHF (congestive heart  failure) (HCC) 08/19/2023   Acute respiratory failure with hypoxia (HCC) 08/17/2023   History of pulmonary embolus (PE) 08/17/2023   Basal cell carcinoma of leg 04/01/2021   Coronary atherosclerosis 04/01/2021   History of pulmonary embolism 04/01/2021   Pulmonary nodule 04/01/2021   Malignant neoplasm of colon (HCC) 03/29/2021   Rectal bleeding 03/15/2021   Hypertension    GERD (gastroesophageal reflux disease)    Colonic mass    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HCC) 03/10/2021   Anemia, unspecified 03/06/2021   Bronchiectasis without complication (HCC) 03/27/2020   Iron deficiency anemia 03/27/2020   Moderate aortic stenosis 03/27/2020   Stage 3a chronic kidney disease (HCC) 07/04/2019   Hypnic headache 05/28/2019   Telogen effluvium 04/17/2019   Xerosis cutis 04/17/2019   Essential hypertension 01/14/2019   Cervical radiculopathy 05/04/2018   Osteoarthritis of left glenohumeral joint 05/04/2018   Vitamin B12 deficiency 01/01/2018   Vascular abnormality 09/11/2017   Dyspepsia 03/28/2017   Pulmonary embolism (HCC) 08/16/2016   Dilated pore of [place] 03/23/2015   Retinal drusen of both eyes 08/28/2014   Status post right cataract extraction 07/30/2014   Verruca 03/19/2014   Chronic back pain 11/06/2013   Postmenopausal osteoporosis 06/11/2013   Anticoagulated on Coumadin 04/06/2013   Long term (current) use of anticoagulants  10/18/2012   Preglaucoma 05/10/2012   Presbyopia 05/10/2012   Senile nuclear sclerosis 05/10/2012   Lobular carcinoma in situ of breast 04/13/2012   Closed fracture of ankle 02/27/2012   History of nonmelanoma skin cancer 09/05/2011   Other benign neoplasm of skin of trunk 09/05/2011   Osteoporosis 08/25/2011   Transient alteration of awareness 09/03/2009   Colon adenoma 07/30/2002    ONSET DATE: 3 months   REFERRING DIAG:  Diagnosis  R53.83 (ICD-10-CM) - Other fatigue    THERAPY DIAG:  Muscle weakness (generalized)  Difficulty in walking, not  elsewhere classified  Unsteadiness on feet  Other abnormalities of gait and mobility  Other low back pain  Other lack of coordination  Rationale for Evaluation and Treatment: Rehabilitation  SUBJECTIVE:                                                                                                                                                                                             SUBJECTIVE STATEMENT: Patient reports having "tremendous" low back pain after last session attributing problems to using ankle weights last visit -up to a 10/10 at times. States feeling okay.   Pt accompanied by: family member  PERTINENT HISTORY:  From recent MD visit.  89 y.o. here for an acute issue. She has a PMH of chronic PE/Coumadin, anemia, colon cancer, breast cancer, large hiatal hernia, moderate aortic stenosis who has concerns about shortness of breath, nausea, generalized weakness. No chest pain. History of hiatal hernia and recent CT chest. Also recent endoscopy. Takes Zofran chronically. Has felt weaker and almost fell recently. Uses a walker. Also on chronic UTI suppression, Keflex.   PAIN:  Are you having pain? Yes: NPRS scale: 3/10 Pain location: low back  Pain description: sore  Aggravating factors: movement  Relieving factors: rest   PRECAUTIONS: Fall  RED FLAGS: None   WEIGHT BEARING RESTRICTIONS: No  FALLS: Has patient fallen in last 6 months? No  LIVING ENVIRONMENT: Lives with: lives alone Lives in: House/apartment Stairs: Yes: External: 1 steps; none Has following equipment at home: Walker - 4 wheeled  PLOF: Independent with household mobility with device and Independent with community mobility with device  PATIENT GOALS: improved strength in BLE   OBJECTIVE:   DIAGNOSTIC FINDINGS:  06/12/2023 EXAM: CT CHEST, ABDOMEN, AND PELVIS WITH CONTRAST IMPRESSION: 1. Status post right hemicolectomy, without recurrent or metastatic disease. Decreased sensitivity  exam, primarily involving the abdomen pelvis, due to paucity of fat. 2. New tiny left groin hernia containing fluid. Consider physical exam correlation. 3. Incidental findings, including: Coronary artery atherosclerosis. Aortic Atherosclerosis (ICD10-I70.0). Dilated esophagus with contrast throughout,  suggesting dysmotility and/or gastroesophageal reflux.  COGNITION: Overall cognitive status: Within functional limits for tasks assessed   SENSATION: Light touch: Impaired  mild paraesthesia in the proximal RLE   COORDINATION: WFL. Ankle to knee limited ROM due to weakness in the R hip      POSTURE: rounded shoulders, forward head, flexed trunk , and severe scoliosis     LOWER EXTREMITY MMT:    MMT Right Eval Left Eval  Hip flexion 4- 4  Hip extension    Hip abduction 4- 44  Hip adduction 4 4  Hip internal rotation    Hip external rotation    Knee flexion 4- 4  Knee extension 4 4+  Ankle dorsiflexion 4 4+  Ankle plantarflexion    Ankle inversion    Ankle eversion    (Blank rows = not tested)    TRANSFERS: Assistive device utilized: Environmental consultant - 4 wheeled  Sit to stand: SBA Stand to sit: SBA Chair to chair: SBA Floor:  Unable to perform    STAIRS: Level of Assistance:  pt declines Stair Negotiation Technique: Step to Pattern with Bilateral Rails Number of Stairs: 4  Height of Stairs: 6  Comments: will need to assess.   GAIT: Gait pattern: step through pattern, Right foot flat, and lateral hip instability Distance walked: 228ft Assistive device utilized: Walker - 4 wheeled Level of assistance: Modified independence and SBA Comments: severe scoliosis.    FUNCTIONAL TESTS:  5 times sit to stand: 30.86 Timed up and go (TUG): 29.39 2 minute walk test: 231ft  10 meter walk test: 0.656m/s, SOB 4-5.  Berg Balance Scale: TBD  PATIENT SURVEYS:  FOTO 56. goal 64  TODAY'S TREATMENT:                                                                                                                               DATE: 10/23/2023    Physical therapy treatment session today consisted of completing assessment of goals and administration of testing as demonstrated and documented in flow sheet, treatment, and goals section of this note. Addition treatments may be found below.   FUNCTIONAL OUTCOME TESTING:   5 times sit to stand: 22.08 using 1 UE support.  2 minute walk test: 360 ft using 4WW 10 meter walk test: 0.88 m/s Berg Balance Scale: 30/56  PATIENT EDUCATION: Education details: Pt educated throughout session about proper posture and technique with exercises. Improved exercise technique, movement at target joints, use of target muscles after min to mod verbal, visual, tactile cues.  Person educated: Patient Education method: Explanation Education comprehension: verbalized understanding and returned demonstration  HOME EXERCISE PROGRAM: Access Code: [email or web address] URL: [email or web address] Date: 09/25/2023 Prepared by: Grier Rocher  Exercises - Seated Hip Abduction with Resistance  - 1 x daily - 4 x weekly - 3 sets - 15 reps - 3 hold - Seated March with Resistance  - 1 x daily - 4 x weekly - 3 sets - 10  reps - 1 hold - Seated Long Arc Quad  - 1 x daily - 4 x weekly - 3 sets - 12 reps - 3 hold - Seated Hip Adduction Isometrics with Ball  - 1 x daily - 4 x weekly - 3 sets - 12 reps - 3 hold - Standing March with Counter Support  - 1 x daily - 3 x weekly - 2 sets - 10 reps - Standing Hip Abduction with Counter Support  - 1 x daily - 3 x weekly - 2 sets - 10 reps - Standing Hip Extension with Counter Support  - 1 x daily - 3 x weekly - 2 sets - 10 reps - Mini Squat with Counter Support  - 1 x daily - 3 x weekly - 2 sets - 10 reps - Standing Knee Flexion with Counter Support  - 1 x daily - 3 x weekly - 2 sets - 10 reps - Standing Tandem Balance with Counter Support  - 1 x daily - 3 x weekly - 2 sets - 4 reps - 10 hold  GOALS: Goals  reviewed with patient? Yes   SHORT TERM GOALS: Target date: 09/19/2023    Patient will be independent in home exercise program to improve strength/mobility for better functional independence with ADLs. Baseline:to be given at next session; 10/23/2023- Patient reports independence with HEP provided and no significant issues.   Goal status: MET   LONG TERM GOALS: Target date: 11/07/2023    Patient will increase FOTO score to equal to or greater than  61   to demonstrate statistically significant improvement in mobility and quality of life.  Baseline: 56; 10/23/2023= 60 Goal status: PROGRESSING  2.  Patient (> 55 years old) will complete five times sit to stand test in < 15 seconds indicating an increased LE strength and improved balance. Baseline: 30.86 sec; 10/23/2023= 22.08 using 1 UE support.  Goal status: PROGRESSING  3.  Patient will increase Berg Balance score by > 6 points to demonstrate decreased fall risk during functional activities  Baseline: 26; 10/23/2023= 30 Goal status: PROGRESSING  4.  Patient will increase 10 meter walk test to >1.23m/s as to improve gait speed for better community ambulation and to reduce fall risk. Baseline: 0.658m/s; 10/23/2023= 0.88 m/s (normal speed with 4WW) Goal status: PROGRESSING  5.  Patient will reduce timed up and go to <11 seconds to reduce fall risk and demonstrate improved transfer/gait ability. Baseline: 29.39 Goal status: INITIAL  6.  Patient will 2 min walk test by at least 18ft as to demonstrate reduced fall risk and improved dynamic gait balance for better safety with community/home ambulation.   Baseline: 238ft; 10/23/2023= 360 feet  with 4WW Goal status: MET  ASSESSMENT: CLINICAL IMPRESSION:  Patient presented with good motivation for today's Progress visit treatment session. Goal were reassessed and patient presents with good overall progress. She has met her STG of independence with HEP and LTG with of 2 min walk test.  She presents with other progression of goals including improved LE strength as seen by decreased time with 5 time Sit to stand and improved gait speed as well. Her FOTO score also approved indicating improved self perceived ability. Patient's condition has the potential to improve in response to therapy. Maximum improvement is yet to be obtained. The anticipated improvement is attainable and reasonable in a generally predictable time. Pt will continue to benefit from skilled physical therapy intervention to address impairments, improve QOL, and attain therapy goals.  OBJECTIVE IMPAIRMENTS: Abnormal gait, cardiopulmonary status limiting activity, decreased activity tolerance, decreased balance, decreased endurance, decreased mobility, difficulty walking, decreased ROM, decreased strength, decreased safety awareness, hypomobility, increased fascial restrictions, impaired flexibility, improper body mechanics, and postural dysfunction.   ACTIVITY LIMITATIONS: lifting, standing, squatting, transfers, and locomotion level  PARTICIPATION LIMITATIONS: laundry, shopping, and community activity  PERSONAL FACTORS: 3+ comorbidities: scoliosis, hx of CA, and PE  are also affecting patient's functional outcome.   REHAB POTENTIAL: Good  CLINICAL DECISION MAKING: Stable/uncomplicated  EVALUATION COMPLEXITY: Moderate  PLAN:  PT FREQUENCY: 1-2x/week  PT DURATION: 12 weeks  PLANNED INTERVENTIONS: Therapeutic exercises, Therapeutic activity, Neuromuscular re-education, Balance training, Gait training, Patient/Family education, Self Care, Joint mobilization, Stair training, Vestibular training, DME instructions, Dry Needling, Spinal mobilization, Cryotherapy, Moist heat, and Manual therapy  PLAN FOR NEXT SESSION:   Dynamic balance training    Lenda Kelp, PT 10/24/2023, 9:08 AM

## 2023-10-25 ENCOUNTER — Ambulatory Visit: Payer: Medicare Other

## 2023-10-30 ENCOUNTER — Ambulatory Visit: Payer: Medicare Other | Attending: Physician Assistant | Admitting: Physical Therapy

## 2023-10-30 DIAGNOSIS — M6281 Muscle weakness (generalized): Secondary | ICD-10-CM | POA: Insufficient documentation

## 2023-10-30 DIAGNOSIS — R2681 Unsteadiness on feet: Secondary | ICD-10-CM | POA: Insufficient documentation

## 2023-10-30 DIAGNOSIS — M5459 Other low back pain: Secondary | ICD-10-CM | POA: Insufficient documentation

## 2023-10-30 DIAGNOSIS — R2689 Other abnormalities of gait and mobility: Secondary | ICD-10-CM | POA: Insufficient documentation

## 2023-10-30 DIAGNOSIS — R262 Difficulty in walking, not elsewhere classified: Secondary | ICD-10-CM | POA: Diagnosis present

## 2023-10-30 DIAGNOSIS — R278 Other lack of coordination: Secondary | ICD-10-CM | POA: Diagnosis present

## 2023-10-30 NOTE — Therapy (Signed)
OUTPATIENT PHYSICAL THERAPY NEURO TREATMENT NOTE/     Patient Name: Kristina Rowe MRN: [id number] DOB:1930-08-20, 89 y.o., female Today's Date: 10/30/2023   PCP: Enid Baas, MD  REFERRING PROVIDER:   Ignacia Bayley, PA-C    END OF SESSION:  PT End of Session - 10/30/23 1308     Visit Number 11    Number of Visits 24    Date for PT Re-Evaluation 11/07/23    Progress Note Due on Visit 20    PT Start Time 1315    PT Stop Time 1355    PT Time Calculation (min) 40 min    Equipment Utilized During Treatment Gait belt    Activity Tolerance Patient tolerated treatment well;No increased pain    Behavior During Therapy WFL for tasks assessed/performed                Past Medical History:  Diagnosis Date   Ductal carcinoma in situ (DCIS) of left breast    Dysphagia    Esophageal stricture    Gastroparesis    GERD (gastroesophageal reflux disease)    Hiatal hernia    Hypertension    Iron deficiency anemia    Osteoporosis    Pulmonary embolism (HCC)    Scoliosis    UTI (urinary tract infection)    Past Surgical History:  Procedure Laterality Date   BREAST LUMPECTOMY Left 2009   Ductal Carcinoma insitu    CATARACT EXTRACTION, BILATERAL     COLONOSCOPY N/A 03/17/2021   Procedure: COLONOSCOPY;  Surgeon: Regis Bill, MD;  Location: [facility];  Service: Endoscopy;  Laterality: N/A;   COLOSTOMY REVISION Right 03/18/2021   Procedure: COLON RESECTION RIGHT;  Surgeon: Henrene Dodge, MD;  Location: [facility];  Service: General;  Laterality: Right;   LAPAROSCOPIC PARAESOPHAGEAL HERNIA REPAIR  2009   LAPAROTOMY N/A 03/10/2021   Procedure: EXPLORATORY LAPAROTOMY;  Surgeon: Henrene Dodge, MD;  Location: [facility];  Service: General;  Laterality: N/A;   TUBAL LIGATION     Patient Active Problem List   Diagnosis Date Noted   Acute on chronic diastolic CHF (congestive heart failure) (HCC) 08/19/2023   Acute respiratory failure with hypoxia (HCC) 08/17/2023    History of pulmonary embolus (PE) 08/17/2023   Basal cell carcinoma of leg 04/01/2021   Coronary atherosclerosis 04/01/2021   History of pulmonary embolism 04/01/2021   Pulmonary nodule 04/01/2021   Malignant neoplasm of colon (HCC) 03/29/2021   Rectal bleeding 03/15/2021   Hypertension    GERD (gastroesophageal reflux disease)    Colonic mass    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HCC) 03/10/2021   Anemia, unspecified 03/06/2021   Bronchiectasis without complication (HCC) 03/27/2020   Iron deficiency anemia 03/27/2020   Moderate aortic stenosis 03/27/2020   Stage 3a chronic kidney disease (HCC) 07/04/2019   Hypnic headache 05/28/2019   Telogen effluvium 04/17/2019   Xerosis cutis 04/17/2019   Essential hypertension 01/14/2019   Cervical radiculopathy 05/04/2018   Osteoarthritis of left glenohumeral joint 05/04/2018   Vitamin B12 deficiency 01/01/2018   Vascular abnormality 09/11/2017   Dyspepsia 03/28/2017   Pulmonary embolism (HCC) 08/16/2016   Dilated pore of [place] 03/23/2015   Retinal drusen of both eyes 08/28/2014   Status post right cataract extraction 07/30/2014   Verruca 03/19/2014   Chronic back pain 11/06/2013   Postmenopausal osteoporosis 06/11/2013   Anticoagulated on Coumadin 04/06/2013   Long term (current) use of anticoagulants 10/18/2012   Preglaucoma 05/10/2012   Presbyopia 05/10/2012   Senile nuclear sclerosis 05/10/2012  Lobular carcinoma in situ of breast 04/13/2012   Closed fracture of ankle 02/27/2012   History of nonmelanoma skin cancer 09/05/2011   Other benign neoplasm of skin of trunk 09/05/2011   Osteoporosis 08/25/2011   Transient alteration of awareness 09/03/2009   Colon adenoma 07/30/2002    ONSET DATE: 3 months   REFERRING DIAG:  Diagnosis  R53.83 (ICD-10-CM) - Other fatigue    THERAPY DIAG:  Muscle weakness (generalized)  Difficulty in walking, not elsewhere classified  Unsteadiness on feet  Other low back pain  Other abnormalities of  gait and mobility  Other lack of coordination  Rationale for Evaluation and Treatment: Rehabilitation  SUBJECTIVE:                                                                                                                                                                                             SUBJECTIVE STATEMENT: Patient reports that she is doing well. States that she went for a few walks including to and from the mail box.   Pt accompanied by: family member  PERTINENT HISTORY:  From recent MD visit.  89 y.o. here for an acute issue. She has a PMH of chronic PE/Coumadin, anemia, colon cancer, breast cancer, large hiatal hernia, moderate aortic stenosis who has concerns about shortness of breath, nausea, generalized weakness. No chest pain. History of hiatal hernia and recent CT chest. Also recent endoscopy. Takes Zofran chronically. Has felt weaker and almost fell recently. Uses a walker. Also on chronic UTI suppression, Keflex.   PAIN:  Are you having pain? Yes: NPRS scale: 3/10 Pain location: low back  Pain description: sore  Aggravating factors: movement  Relieving factors: rest   PRECAUTIONS: Fall  RED FLAGS: None   WEIGHT BEARING RESTRICTIONS: No  FALLS: Has patient fallen in last 6 months? No  LIVING ENVIRONMENT: Lives with: lives alone Lives in: House/apartment Stairs: Yes: External: 1 steps; none Has following equipment at home: Walker - 4 wheeled  PLOF: Independent with household mobility with device and Independent with community mobility with device  PATIENT GOALS: improved strength in BLE   OBJECTIVE:   DIAGNOSTIC FINDINGS:  06/12/2023 EXAM: CT CHEST, ABDOMEN, AND PELVIS WITH CONTRAST IMPRESSION: 1. Status post right hemicolectomy, without recurrent or metastatic disease. Decreased sensitivity exam, primarily involving the abdomen pelvis, due to paucity of fat. 2. New tiny left groin hernia containing fluid. Consider physical exam  correlation. 3. Incidental findings, including: Coronary artery atherosclerosis. Aortic Atherosclerosis (ICD10-I70.0). Dilated esophagus with contrast throughout, suggesting dysmotility and/or gastroesophageal reflux.  COGNITION: Overall cognitive status: Within functional limits for tasks assessed   SENSATION: Light touch: Impaired  mild paraesthesia in the proximal RLE   COORDINATION: WFL. Ankle to knee limited ROM due to weakness in the R hip      POSTURE: rounded shoulders, forward head, flexed trunk , and severe scoliosis     LOWER EXTREMITY MMT:    MMT Right Eval Left Eval  Hip flexion 4- 4  Hip extension    Hip abduction 4- 44  Hip adduction 4 4  Hip internal rotation    Hip external rotation    Knee flexion 4- 4  Knee extension 4 4+  Ankle dorsiflexion 4 4+  Ankle plantarflexion    Ankle inversion    Ankle eversion    (Blank rows = not tested)    TRANSFERS: Assistive device utilized: Environmental consultant - 4 wheeled  Sit to stand: SBA Stand to sit: SBA Chair to chair: SBA Floor:  Unable to perform    STAIRS: Level of Assistance:  pt declines Stair Negotiation Technique: Step to Pattern with Bilateral Rails Number of Stairs: 4  Height of Stairs: 6  Comments: will need to assess.   GAIT: Gait pattern: step through pattern, Right foot flat, and lateral hip instability Distance walked: 249ft Assistive device utilized: Walker - 4 wheeled Level of assistance: Modified independence and SBA Comments: severe scoliosis.    FUNCTIONAL TESTS:  5 times sit to stand: 30.86 Timed up and go (TUG): 29.39 2 minute walk test: 254ft  10 meter walk test: 0.665m/s, SOB 4-5.  Berg Balance Scale: TBD  PATIENT SURVEYS:  FOTO 56. goal 64  TODAY'S TREATMENT:                                                                                                                              DATE: 10/23/2023   PT instructed pt in TUG: 16.4 sec ( 18.22, 15.11, 15.94 sec average of 3  trials; >13.5 sec indicates increased fall risk)  Dynamic balance training.  Standing on airex beam.  AP control 3 x 20 sec with no UE support  Tandem stance 3 x 20 sec with finger tip support  Foot tap on 6 inch step without resistance.   While standing, trunk extension x 10 holding rail   Sitting trunk extension over narrow airex beam x 10. Extremely limited ROM.   Sit<>stand, 2x 5 heavy UE support from arm rests.   Pt required prolonged therapeutic rest breaks. Due to fatigue mild SOB.     PATIENT EDUCATION: Education details: Pt educated throughout session about proper posture and technique with exercises. Improved exercise technique, movement at target joints, use of target muscles after min to mod verbal, visual, tactile cues.  Person educated: Patient Education method: Explanation Education comprehension: verbalized understanding and returned demonstration  HOME EXERCISE PROGRAM: Access Code: [email or web address] URL: [email or web address] Date: 09/25/2023 Prepared by: Grier Rocher  Exercises - Seated Hip Abduction with Resistance  - 1 x daily - 4 x weekly - 3 sets - 15 reps - 3 hold -  Seated March with Resistance  - 1 x daily - 4 x weekly - 3 sets - 10 reps - 1 hold - Seated Long Arc Quad  - 1 x daily - 4 x weekly - 3 sets - 12 reps - 3 hold - Seated Hip Adduction Isometrics with Ball  - 1 x daily - 4 x weekly - 3 sets - 12 reps - 3 hold - Standing March with Counter Support  - 1 x daily - 3 x weekly - 2 sets - 10 reps - Standing Hip Abduction with Counter Support  - 1 x daily - 3 x weekly - 2 sets - 10 reps - Standing Hip Extension with Counter Support  - 1 x daily - 3 x weekly - 2 sets - 10 reps - Mini Squat with Counter Support  - 1 x daily - 3 x weekly - 2 sets - 10 reps - Standing Knee Flexion with Counter Support  - 1 x daily - 3 x weekly - 2 sets - 10 reps - Standing Tandem Balance with Counter Support  - 1 x daily - 3 x weekly - 2 sets - 4 reps - 10  hold  GOALS: Goals reviewed with patient? Yes   SHORT TERM GOALS: Target date: 09/19/2023    Patient will be independent in home exercise program to improve strength/mobility for better functional independence with ADLs. Baseline:to be given at next session; 10/23/2023- Patient reports independence with HEP provided and no significant issues.   Goal status: MET   LONG TERM GOALS: Target date: 11/07/2023    Patient will increase FOTO score to equal to or greater than  61   to demonstrate statistically significant improvement in mobility and quality of life.  Baseline: 56; 10/23/2023= 60 Goal status: PROGRESSING  2.  Patient (> 35 years old) will complete five times sit to stand test in < 15 seconds indicating an increased LE strength and improved balance. Baseline: 30.86 sec; 10/23/2023= 22.08 using 1 UE support.  Goal status: PROGRESSING  3.  Patient will increase Berg Balance score by > 6 points to demonstrate decreased fall risk during functional activities  Baseline: 26; 10/23/2023= 30 Goal status: PROGRESSING  4.  Patient will increase 10 meter walk test to >1.65m/s as to improve gait speed for better community ambulation and to reduce fall risk. Baseline: 0.675m/s; 10/23/2023= 0.88 m/s (normal speed with 4WW) Goal status: PROGRESSING  5.  Patient will reduce timed up and go to <11 seconds to reduce fall risk and demonstrate improved transfer/gait ability. Baseline: 29.39 12/2: 16.4 sec with Rollator  Goal status: INITIAL  6.  Patient will 2 min walk test by at least 46ft as to demonstrate reduced fall risk and improved dynamic gait balance for better safety with community/home ambulation.   Baseline: 254ft; 10/23/2023= 360 feet  with 4WW Goal status: MET  ASSESSMENT: CLINICAL IMPRESSION:  Patient presented with good motivation for today's Progress visit treatment session. Tug assessed by PT, improved by ~50% from initial with UE supported on rollator. Dynamic  balance training on airex pad and various planes. Rest required from mild SOB due to postural deficits.  Pt will continue to benefit from skilled physical therapy intervention to address impairments, improve QOL, and attain therapy goals.     OBJECTIVE IMPAIRMENTS: Abnormal gait, cardiopulmonary status limiting activity, decreased activity tolerance, decreased balance, decreased endurance, decreased mobility, difficulty walking, decreased ROM, decreased strength, decreased safety awareness, hypomobility, increased fascial restrictions, impaired flexibility, improper body mechanics, and postural  dysfunction.   ACTIVITY LIMITATIONS: lifting, standing, squatting, transfers, and locomotion level  PARTICIPATION LIMITATIONS: laundry, shopping, and community activity  PERSONAL FACTORS: 3+ comorbidities: scoliosis, hx of CA, and PE  are also affecting patient's functional outcome.   REHAB POTENTIAL: Good  CLINICAL DECISION MAKING: Stable/uncomplicated  EVALUATION COMPLEXITY: Moderate  PLAN:  PT FREQUENCY: 1-2x/week  PT DURATION: 12 weeks  PLANNED INTERVENTIONS: Therapeutic exercises, Therapeutic activity, Neuromuscular re-education, Balance training, Gait training, Patient/Family education, Self Care, Joint mobilization, Stair training, Vestibular training, DME instructions, Dry Needling, Spinal mobilization, Cryotherapy, Moist heat, and Manual therapy  PLAN FOR NEXT SESSION:   Dynamic balance training    Golden Pop, PT 10/30/2023, 1:11 PM

## 2023-11-01 ENCOUNTER — Ambulatory Visit: Payer: Medicare Other | Admitting: Physical Therapy

## 2023-11-03 ENCOUNTER — Ambulatory Visit: Payer: Medicare Other | Admitting: Physical Therapy

## 2023-11-03 DIAGNOSIS — M6281 Muscle weakness (generalized): Secondary | ICD-10-CM | POA: Diagnosis not present

## 2023-11-03 DIAGNOSIS — R2681 Unsteadiness on feet: Secondary | ICD-10-CM

## 2023-11-03 DIAGNOSIS — R262 Difficulty in walking, not elsewhere classified: Secondary | ICD-10-CM

## 2023-11-03 NOTE — Therapy (Signed)
OUTPATIENT PHYSICAL THERAPY NEURO TREATMENT NOTE/     Patient Name: Kristina Rowe MRN: [id number] DOB:March 12, 1930, 89 y.o., female Today's Date: 11/03/2023   PCP: Enid Baas, MD  REFERRING PROVIDER:   Ignacia Bayley, PA-C    END OF SESSION:  PT End of Session - 11/03/23 1029     Visit Number 12    Number of Visits 24    Date for PT Re-Evaluation 11/07/23    Progress Note Due on Visit 20    PT Start Time 1022    PT Stop Time 1100    PT Time Calculation (min) 38 min    Equipment Utilized During Treatment Gait belt    Activity Tolerance Patient tolerated treatment well;No increased pain    Behavior During Therapy WFL for tasks assessed/performed                 Past Medical History:  Diagnosis Date   Ductal carcinoma in situ (DCIS) of left breast    Dysphagia    Esophageal stricture    Gastroparesis    GERD (gastroesophageal reflux disease)    Hiatal hernia    Hypertension    Iron deficiency anemia    Osteoporosis    Pulmonary embolism (HCC)    Scoliosis    UTI (urinary tract infection)    Past Surgical History:  Procedure Laterality Date   BREAST LUMPECTOMY Left 2009   Ductal Carcinoma insitu    CATARACT EXTRACTION, BILATERAL     COLONOSCOPY N/A 03/17/2021   Procedure: COLONOSCOPY;  Surgeon: Regis Bill, MD;  Location: [facility];  Service: Endoscopy;  Laterality: N/A;   COLOSTOMY REVISION Right 03/18/2021   Procedure: COLON RESECTION RIGHT;  Surgeon: Henrene Dodge, MD;  Location: [facility];  Service: General;  Laterality: Right;   LAPAROSCOPIC PARAESOPHAGEAL HERNIA REPAIR  2009   LAPAROTOMY N/A 03/10/2021   Procedure: EXPLORATORY LAPAROTOMY;  Surgeon: Henrene Dodge, MD;  Location: [facility];  Service: General;  Laterality: N/A;   TUBAL LIGATION     Patient Active Problem List   Diagnosis Date Noted   Acute on chronic diastolic CHF (congestive heart failure) (HCC) 08/19/2023   Acute respiratory failure with hypoxia (HCC) 08/17/2023    History of pulmonary embolus (PE) 08/17/2023   Basal cell carcinoma of leg 04/01/2021   Coronary atherosclerosis 04/01/2021   History of pulmonary embolism 04/01/2021   Pulmonary nodule 04/01/2021   Malignant neoplasm of colon (HCC) 03/29/2021   Rectal bleeding 03/15/2021   Hypertension    GERD (gastroesophageal reflux disease)    Colonic mass    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HCC) 03/10/2021   Anemia, unspecified 03/06/2021   Bronchiectasis without complication (HCC) 03/27/2020   Iron deficiency anemia 03/27/2020   Moderate aortic stenosis 03/27/2020   Stage 3a chronic kidney disease (HCC) 07/04/2019   Hypnic headache 05/28/2019   Telogen effluvium 04/17/2019   Xerosis cutis 04/17/2019   Essential hypertension 01/14/2019   Cervical radiculopathy 05/04/2018   Osteoarthritis of left glenohumeral joint 05/04/2018   Vitamin B12 deficiency 01/01/2018   Vascular abnormality 09/11/2017   Dyspepsia 03/28/2017   Pulmonary embolism (HCC) 08/16/2016   Dilated pore of [place] 03/23/2015   Retinal drusen of both eyes 08/28/2014   Status post right cataract extraction 07/30/2014   Verruca 03/19/2014   Chronic back pain 11/06/2013   Postmenopausal osteoporosis 06/11/2013   Anticoagulated on Coumadin 04/06/2013   Long term (current) use of anticoagulants 10/18/2012   Preglaucoma 05/10/2012   Presbyopia 05/10/2012   Senile nuclear sclerosis 05/10/2012  Lobular carcinoma in situ of breast 04/13/2012   Closed fracture of ankle 02/27/2012   History of nonmelanoma skin cancer 09/05/2011   Other benign neoplasm of skin of trunk 09/05/2011   Osteoporosis 08/25/2011   Transient alteration of awareness 09/03/2009   Colon adenoma 07/30/2002    ONSET DATE: 3 months   REFERRING DIAG:  Diagnosis  R53.83 (ICD-10-CM) - Other fatigue    THERAPY DIAG:  Muscle weakness (generalized)  Difficulty in walking, not elsewhere classified  Unsteadiness on feet  Rationale for Evaluation and Treatment:  Rehabilitation  SUBJECTIVE:                                                                                                                                                                                             SUBJECTIVE STATEMENT: Patient reports that she is doing well. Reports she is happy she was able to get to the early appointment time.   Pt accompanied by: family member  PERTINENT HISTORY:  From recent MD visit.  89 y.o. here for an acute issue. She has a PMH of chronic PE/Coumadin, anemia, colon cancer, breast cancer, large hiatal hernia, moderate aortic stenosis who has concerns about shortness of breath, nausea, generalized weakness. No chest pain. History of hiatal hernia and recent CT chest. Also recent endoscopy. Takes Zofran chronically. Has felt weaker and almost fell recently. Uses a walker. Also on chronic UTI suppression, Keflex.   PAIN:  Are you having pain? Yes: NPRS scale: 3/10 Pain location: low back  Pain description: sore  Aggravating factors: movement  Relieving factors: rest   PRECAUTIONS: Fall  RED FLAGS: None   WEIGHT BEARING RESTRICTIONS: No  FALLS: Has patient fallen in last 6 months? No  LIVING ENVIRONMENT: Lives with: lives alone Lives in: House/apartment Stairs: Yes: External: 1 steps; none Has following equipment at home: Walker - 4 wheeled  PLOF: Independent with household mobility with device and Independent with community mobility with device  PATIENT GOALS: improved strength in BLE   OBJECTIVE:   DIAGNOSTIC FINDINGS:  06/12/2023 EXAM: CT CHEST, ABDOMEN, AND PELVIS WITH CONTRAST IMPRESSION: 1. Status post right hemicolectomy, without recurrent or metastatic disease. Decreased sensitivity exam, primarily involving the abdomen pelvis, due to paucity of fat. 2. New tiny left groin hernia containing fluid. Consider physical exam correlation. 3. Incidental findings, including: Coronary artery atherosclerosis. Aortic  Atherosclerosis (ICD10-I70.0). Dilated esophagus with contrast throughout, suggesting dysmotility and/or gastroesophageal reflux.  COGNITION: Overall cognitive status: Within functional limits for tasks assessed   SENSATION: Light touch: Impaired  mild paraesthesia in the proximal RLE   COORDINATION: WFL. Ankle to knee limited ROM due to  weakness in the R hip      POSTURE: rounded shoulders, forward head, flexed trunk , and severe scoliosis     LOWER EXTREMITY MMT:    MMT Right Eval Left Eval  Hip flexion 4- 4  Hip extension    Hip abduction 4- 44  Hip adduction 4 4  Hip internal rotation    Hip external rotation    Knee flexion 4- 4  Knee extension 4 4+  Ankle dorsiflexion 4 4+  Ankle plantarflexion    Ankle inversion    Ankle eversion    (Blank rows = not tested)    TRANSFERS: Assistive device utilized: Environmental consultant - 4 wheeled  Sit to stand: SBA Stand to sit: SBA Chair to chair: SBA Floor:  Unable to perform    STAIRS: Level of Assistance:  pt declines Stair Negotiation Technique: Step to Pattern with Bilateral Rails Number of Stairs: 4  Height of Stairs: 6  Comments: will need to assess.   GAIT: Gait pattern: step through pattern, Right foot flat, and lateral hip instability Distance walked: 230ft Assistive device utilized: Walker - 4 wheeled Level of assistance: Modified independence and SBA Comments: severe scoliosis.    FUNCTIONAL TESTS:  5 times sit to stand: 30.86 Timed up and go (TUG): 29.39 2 minute walk test: 217ft  10 meter walk test: 0.648m/s, SOB 4-5.  Berg Balance Scale: TBD  PATIENT SURVEYS:  FOTO 56. goal 64  TODAY'S TREATMENT:                                                                                                                              DATE: 10/23/2023   Nustep level 1, 2 x 1.5 min intervals, LE only, fatigue noted and rest break at 1.5 min   Seated hip adduction 2 x 10 x 3 sec holds  Standing split stance on  airex pad 2 x 30 sec ea LE  Hip abduction in standing 2 x 10 ea LE   Seated gluteal press down 2 x 10 ea LE with GTB   Seated HS curl GTB 2 x 10 ea LE  Dynamic balance training.   Foot tap on 6 inch step without resistance. Fingertip support throughout  stand, 2x 5 heavy UE support from arm rests.   Pt required prolonged therapeutic rest breaks. Due to fatigue mild SOB.     PATIENT EDUCATION: Education details: Pt educated throughout session about proper posture and technique with exercises. Improved exercise technique, movement at target joints, use of target muscles after min to mod verbal, visual, tactile cues.  Person educated: Patient Education method: Explanation Education comprehension: verbalized understanding and returned demonstration  HOME EXERCISE PROGRAM: Access Code: [email or web address] URL: [email or web address] Date: 09/25/2023 Prepared by: Grier Rocher  Exercises - Seated Hip Abduction with Resistance  - 1 x daily - 4 x weekly - 3 sets - 15 reps - 3 hold - Seated March with Resistance  - 1 x daily -  4 x weekly - 3 sets - 10 reps - 1 hold - Seated Long Arc Quad  - 1 x daily - 4 x weekly - 3 sets - 12 reps - 3 hold - Seated Hip Adduction Isometrics with Ball  - 1 x daily - 4 x weekly - 3 sets - 12 reps - 3 hold - Standing March with Counter Support  - 1 x daily - 3 x weekly - 2 sets - 10 reps - Standing Hip Abduction with Counter Support  - 1 x daily - 3 x weekly - 2 sets - 10 reps - Standing Hip Extension with Counter Support  - 1 x daily - 3 x weekly - 2 sets - 10 reps - Mini Squat with Counter Support  - 1 x daily - 3 x weekly - 2 sets - 10 reps - Standing Knee Flexion with Counter Support  - 1 x daily - 3 x weekly - 2 sets - 10 reps - Standing Tandem Balance with Counter Support  - 1 x daily - 3 x weekly - 2 sets - 4 reps - 10 hold  GOALS: Goals reviewed with patient? Yes   SHORT TERM GOALS: Target date: 09/19/2023    Patient will be  independent in home exercise program to improve strength/mobility for better functional independence with ADLs. Baseline:to be given at next session; 10/23/2023- Patient reports independence with HEP provided and no significant issues.   Goal status: MET   LONG TERM GOALS: Target date: 11/07/2023    Patient will increase FOTO score to equal to or greater than  61   to demonstrate statistically significant improvement in mobility and quality of life.  Baseline: 56; 10/23/2023= 60 Goal status: PROGRESSING  2.  Patient (> 41 years old) will complete five times sit to stand test in < 15 seconds indicating an increased LE strength and improved balance. Baseline: 30.86 sec; 10/23/2023= 22.08 using 1 UE support.  Goal status: PROGRESSING  3.  Patient will increase Berg Balance score by > 6 points to demonstrate decreased fall risk during functional activities  Baseline: 26; 10/23/2023= 30 Goal status: PROGRESSING  4.  Patient will increase 10 meter walk test to >1.64m/s as to improve gait speed for better community ambulation and to reduce fall risk. Baseline: 0.686m/s; 10/23/2023= 0.88 m/s (normal speed with 4WW) Goal status: PROGRESSING  5.  Patient will reduce timed up and go to <11 seconds to reduce fall risk and demonstrate improved transfer/gait ability. Baseline: 29.39 12/2: 16.4 sec with Rollator  Goal status: INITIAL  6.  Patient will 2 min walk test by at least 77ft as to demonstrate reduced fall risk and improved dynamic gait balance for better safety with community/home ambulation.   Baseline: 218ft; 10/23/2023= 360 feet  with 4WW Goal status: MET  ASSESSMENT: CLINICAL IMPRESSION:  Patient presented with good motivation for today's Progress visit treatment session. Pt altered from standing therex to seated therex to provide greater overall workload with less need for therapeuitic rest breaks although frequent breaks still necessary. Rest required from mild SOB due to  postural deficits.  Pt will continue to benefit from skilled physical therapy intervention to address impairments, improve QOL, and attain therapy goals.     OBJECTIVE IMPAIRMENTS: Abnormal gait, cardiopulmonary status limiting activity, decreased activity tolerance, decreased balance, decreased endurance, decreased mobility, difficulty walking, decreased ROM, decreased strength, decreased safety awareness, hypomobility, increased fascial restrictions, impaired flexibility, improper body mechanics, and postural dysfunction.   ACTIVITY LIMITATIONS: lifting, standing, squatting,  transfers, and locomotion level  PARTICIPATION LIMITATIONS: laundry, shopping, and community activity  PERSONAL FACTORS: 3+ comorbidities: scoliosis, hx of CA, and PE  are also affecting patient's functional outcome.   REHAB POTENTIAL: Good  CLINICAL DECISION MAKING: Stable/uncomplicated  EVALUATION COMPLEXITY: Moderate  PLAN:  PT FREQUENCY: 1-2x/week  PT DURATION: 12 weeks  PLANNED INTERVENTIONS: Therapeutic exercises, Therapeutic activity, Neuromuscular re-education, Balance training, Gait training, Patient/Family education, Self Care, Joint mobilization, Stair training, Vestibular training, DME instructions, Dry Needling, Spinal mobilization, Cryotherapy, Moist heat, and Manual therapy  PLAN FOR NEXT SESSION:   Dynamic balance training    Norman Herrlich, PT 11/03/2023, 11:06 AM

## 2023-11-06 ENCOUNTER — Ambulatory Visit: Payer: Medicare Other

## 2023-11-06 DIAGNOSIS — M6281 Muscle weakness (generalized): Secondary | ICD-10-CM | POA: Diagnosis not present

## 2023-11-06 DIAGNOSIS — R2681 Unsteadiness on feet: Secondary | ICD-10-CM

## 2023-11-06 DIAGNOSIS — R262 Difficulty in walking, not elsewhere classified: Secondary | ICD-10-CM

## 2023-11-06 NOTE — Therapy (Signed)
OUTPATIENT PHYSICAL THERAPY NEURO TREATMENT NOTE    Patient Name: Kristina Rowe MRN: [id number] DOB:05/03/1930, 89 y.o., female Today's Date: 11/06/2023   PCP: Enid Baas, MD  REFERRING PROVIDER:   Ignacia Bayley, PA-C    END OF SESSION:  PT End of Session - 11/06/23 1455     Visit Number 13    Number of Visits 24    Date for PT Re-Evaluation 11/07/23    Progress Note Due on Visit 20    PT Start Time 1457    PT Stop Time 1530    PT Time Calculation (min) 33 min    Equipment Utilized During Treatment Gait belt    Activity Tolerance Patient tolerated treatment well;No increased pain    Behavior During Therapy WFL for tasks assessed/performed                  Past Medical History:  Diagnosis Date   Ductal carcinoma in situ (DCIS) of left breast    Dysphagia    Esophageal stricture    Gastroparesis    GERD (gastroesophageal reflux disease)    Hiatal hernia    Hypertension    Iron deficiency anemia    Osteoporosis    Pulmonary embolism (HCC)    Scoliosis    UTI (urinary tract infection)    Past Surgical History:  Procedure Laterality Date   BREAST LUMPECTOMY Left 2009   Ductal Carcinoma insitu    CATARACT EXTRACTION, BILATERAL     COLONOSCOPY N/A 03/17/2021   Procedure: COLONOSCOPY;  Surgeon: Regis Bill, MD;  Location: [facility];  Service: Endoscopy;  Laterality: N/A;   COLOSTOMY REVISION Right 03/18/2021   Procedure: COLON RESECTION RIGHT;  Surgeon: Henrene Dodge, MD;  Location: [facility];  Service: General;  Laterality: Right;   LAPAROSCOPIC PARAESOPHAGEAL HERNIA REPAIR  2009   LAPAROTOMY N/A 03/10/2021   Procedure: EXPLORATORY LAPAROTOMY;  Surgeon: Henrene Dodge, MD;  Location: [facility];  Service: General;  Laterality: N/A;   TUBAL LIGATION     Patient Active Problem List   Diagnosis Date Noted   Acute on chronic diastolic CHF (congestive heart failure) (HCC) 08/19/2023   Acute respiratory failure with hypoxia (HCC) 08/17/2023    History of pulmonary embolus (PE) 08/17/2023   Basal cell carcinoma of leg 04/01/2021   Coronary atherosclerosis 04/01/2021   History of pulmonary embolism 04/01/2021   Pulmonary nodule 04/01/2021   Malignant neoplasm of colon (HCC) 03/29/2021   Rectal bleeding 03/15/2021   Hypertension    GERD (gastroesophageal reflux disease)    Colonic mass    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HCC) 03/10/2021   Anemia, unspecified 03/06/2021   Bronchiectasis without complication (HCC) 03/27/2020   Iron deficiency anemia 03/27/2020   Moderate aortic stenosis 03/27/2020   Stage 3a chronic kidney disease (HCC) 07/04/2019   Hypnic headache 05/28/2019   Telogen effluvium 04/17/2019   Xerosis cutis 04/17/2019   Essential hypertension 01/14/2019   Cervical radiculopathy 05/04/2018   Osteoarthritis of left glenohumeral joint 05/04/2018   Vitamin B12 deficiency 01/01/2018   Vascular abnormality 09/11/2017   Dyspepsia 03/28/2017   Pulmonary embolism (HCC) 08/16/2016   Dilated pore of [place] 03/23/2015   Retinal drusen of both eyes 08/28/2014   Status post right cataract extraction 07/30/2014   Verruca 03/19/2014   Chronic back pain 11/06/2013   Postmenopausal osteoporosis 06/11/2013   Anticoagulated on Coumadin 04/06/2013   Long term (current) use of anticoagulants 10/18/2012   Preglaucoma 05/10/2012   Presbyopia 05/10/2012   Senile nuclear sclerosis 05/10/2012  Lobular carcinoma in situ of breast 04/13/2012   Closed fracture of ankle 02/27/2012   History of nonmelanoma skin cancer 09/05/2011   Other benign neoplasm of skin of trunk 09/05/2011   Osteoporosis 08/25/2011   Transient alteration of awareness 09/03/2009   Colon adenoma 07/30/2002    ONSET DATE: 3 months   REFERRING DIAG:  Diagnosis  R53.83 (ICD-10-CM) - Other fatigue    THERAPY DIAG:  Muscle weakness (generalized)  Unsteadiness on feet  Difficulty in walking, not elsewhere classified  Rationale for Evaluation and Treatment:  Rehabilitation  SUBJECTIVE:                                                                                                                                                                                             SUBJECTIVE STATEMENT: Pt reports no updates, running a bit late this morning. She says "I couldn't get going today. I think because of the weather." Pt reports no stumbles/falls.  Pt accompanied by: family member  PERTINENT HISTORY:  From recent MD visit.  89 y.o. here for an acute issue. She has a PMH of chronic PE/Coumadin, anemia, colon cancer, breast cancer, large hiatal hernia, moderate aortic stenosis who has concerns about shortness of breath, nausea, generalized weakness. No chest pain. History of hiatal hernia and recent CT chest. Also recent endoscopy. Takes Zofran chronically. Has felt weaker and almost fell recently. Uses a walker. Also on chronic UTI suppression, Keflex.   PAIN:  Are you having pain? Yes: NPRS scale: 3/10 Pain location: low back  Pain description: sore  Aggravating factors: movement  Relieving factors: rest   PRECAUTIONS: Fall  RED FLAGS: None   WEIGHT BEARING RESTRICTIONS: No  FALLS: Has patient fallen in last 6 months? No  LIVING ENVIRONMENT: Lives with: lives alone Lives in: House/apartment Stairs: Yes: External: 1 steps; none Has following equipment at home: Walker - 4 wheeled  PLOF: Independent with household mobility with device and Independent with community mobility with device  PATIENT GOALS: improved strength in BLE   OBJECTIVE:   DIAGNOSTIC FINDINGS:  06/12/2023 EXAM: CT CHEST, ABDOMEN, AND PELVIS WITH CONTRAST IMPRESSION: 1. Status post right hemicolectomy, without recurrent or metastatic disease. Decreased sensitivity exam, primarily involving the abdomen pelvis, due to paucity of fat. 2. New tiny left groin hernia containing fluid. Consider physical exam correlation. 3. Incidental findings, including: Coronary  artery atherosclerosis. Aortic Atherosclerosis (ICD10-I70.0). Dilated esophagus with contrast throughout, suggesting dysmotility and/or gastroesophageal reflux.  COGNITION: Overall cognitive status: Within functional limits for tasks assessed   SENSATION: Light touch: Impaired  mild paraesthesia in the proximal RLE   COORDINATION: WFL. Ankle to  knee limited ROM due to weakness in the R hip      POSTURE: rounded shoulders, forward head, flexed trunk , and severe scoliosis     LOWER EXTREMITY MMT:    MMT Right Eval Left Eval  Hip flexion 4- 4  Hip extension    Hip abduction 4- 44  Hip adduction 4 4  Hip internal rotation    Hip external rotation    Knee flexion 4- 4  Knee extension 4 4+  Ankle dorsiflexion 4 4+  Ankle plantarflexion    Ankle inversion    Ankle eversion    (Blank rows = not tested)    TRANSFERS: Assistive device utilized: Environmental consultant - 4 wheeled  Sit to stand: SBA Stand to sit: SBA Chair to chair: SBA Floor:  Unable to perform    STAIRS: Level of Assistance:  pt declines Stair Negotiation Technique: Step to Pattern with Bilateral Rails Number of Stairs: 4  Height of Stairs: 6  Comments: will need to assess.   GAIT: Gait pattern: step through pattern, Right foot flat, and lateral hip instability Distance walked: 281ft Assistive device utilized: Walker - 4 wheeled Level of assistance: Modified independence and SBA Comments: severe scoliosis.    FUNCTIONAL TESTS:  5 times sit to stand: 30.86 Timed up and go (TUG): 29.39 2 minute walk test: 24ft  10 meter walk test: 0.612m/s, SOB 4-5.  Berg Balance Scale: TBD  PATIENT SURVEYS:  FOTO 56. goal 64  TODAY'S TREATMENT:                                                                                                                              DATE: 10/23/2023   TE: Nustep level 1 x 4 min. LE only. SPM 40s-70s. Pt continues to report fatigue  Seated hip adduction with pball 2 x 10 x 3  sec holds  STS with UE assist 2x6  Seated HS curl GTB 2 x 12 ea LE Hip abduction in standing 2 x 15ea LE Standing hip ext 15x each LE   STS 10x      PATIENT EDUCATION: Education details: Pt educated throughout session about proper posture and technique with exercises. Improved exercise technique, movement at target joints, use of target muscles after min to mod verbal, visual, tactile cues.  Person educated: Patient Education method: Explanation Education comprehension: verbalized understanding and returned demonstration  HOME EXERCISE PROGRAM: Access Code: [email or web address] URL: [email or web address] Date: 09/25/2023 Prepared by: Grier Rocher  Exercises - Seated Hip Abduction with Resistance  - 1 x daily - 4 x weekly - 3 sets - 15 reps - 3 hold - Seated March with Resistance  - 1 x daily - 4 x weekly - 3 sets - 10 reps - 1 hold - Seated Long Arc Quad  - 1 x daily - 4 x weekly - 3 sets - 12 reps - 3 hold - Seated Hip Adduction Isometrics with Ball  - 1 x daily -  4 x weekly - 3 sets - 12 reps - 3 hold - Standing March with Counter Support  - 1 x daily - 3 x weekly - 2 sets - 10 reps - Standing Hip Abduction with Counter Support  - 1 x daily - 3 x weekly - 2 sets - 10 reps - Standing Hip Extension with Counter Support  - 1 x daily - 3 x weekly - 2 sets - 10 reps - Mini Squat with Counter Support  - 1 x daily - 3 x weekly - 2 sets - 10 reps - Standing Knee Flexion with Counter Support  - 1 x daily - 3 x weekly - 2 sets - 10 reps - Standing Tandem Balance with Counter Support  - 1 x daily - 3 x weekly - 2 sets - 4 reps - 10 hold  GOALS: Goals reviewed with patient? Yes   SHORT TERM GOALS: Target date: 09/19/2023    Patient will be independent in home exercise program to improve strength/mobility for better functional independence with ADLs. Baseline:to be given at next session; 10/23/2023- Patient reports independence with HEP provided and no significant issues.    Goal status: MET   LONG TERM GOALS: Target date: 11/07/2023    Patient will increase FOTO score to equal to or greater than  61   to demonstrate statistically significant improvement in mobility and quality of life.  Baseline: 56; 10/23/2023= 60 Goal status: PROGRESSING  2.  Patient (> 43 years old) will complete five times sit to stand test in < 15 seconds indicating an increased LE strength and improved balance. Baseline: 30.86 sec; 10/23/2023= 22.08 using 1 UE support.  Goal status: PROGRESSING  3.  Patient will increase Berg Balance score by > 6 points to demonstrate decreased fall risk during functional activities  Baseline: 26; 10/23/2023= 30 Goal status: PROGRESSING  4.  Patient will increase 10 meter walk test to >1.63m/s as to improve gait speed for better community ambulation and to reduce fall risk. Baseline: 0.677m/s; 10/23/2023= 0.88 m/s (normal speed with 4WW) Goal status: PROGRESSING  5.  Patient will reduce timed up and go to <11 seconds to reduce fall risk and demonstrate improved transfer/gait ability. Baseline: 29.39 12/2: 16.4 sec with Rollator  Goal status: INITIAL  6.  Patient will 2 min walk test by at least 23ft as to demonstrate reduced fall risk and improved dynamic gait balance for better safety with community/home ambulation.   Baseline: 244ft; 10/23/2023= 360 feet  with 4WW Goal status: MET  ASSESSMENT: CLINICAL IMPRESSION: Session limited due to pt late arrival, however, pt highly motivated to participate in PT. PT continued plan as laid out in prior treatments. Pt still quick to fatigue with endurance-based exercise but was able to complete nustep for longer. Pt also able to perform multiple reps of STS, but finds this intervention very challenging. Pt will continue to benefit from skilled physical therapy intervention to address impairments, improve QOL, and attain therapy goals.     OBJECTIVE IMPAIRMENTS: Abnormal gait, cardiopulmonary status  limiting activity, decreased activity tolerance, decreased balance, decreased endurance, decreased mobility, difficulty walking, decreased ROM, decreased strength, decreased safety awareness, hypomobility, increased fascial restrictions, impaired flexibility, improper body mechanics, and postural dysfunction.   ACTIVITY LIMITATIONS: lifting, standing, squatting, transfers, and locomotion level  PARTICIPATION LIMITATIONS: laundry, shopping, and community activity  PERSONAL FACTORS: 3+ comorbidities: scoliosis, hx of CA, and PE  are also affecting patient's functional outcome.   REHAB POTENTIAL: Good  CLINICAL DECISION MAKING: Stable/uncomplicated  EVALUATION COMPLEXITY: Moderate  PLAN:  PT FREQUENCY: 1-2x/week  PT DURATION: 12 weeks  PLANNED INTERVENTIONS: Therapeutic exercises, Therapeutic activity, Neuromuscular re-education, Balance training, Gait training, Patient/Family education, Self Care, Joint mobilization, Stair training, Vestibular training, DME instructions, Dry Needling, Spinal mobilization, Cryotherapy, Moist heat, and Manual therapy  PLAN FOR NEXT SESSION:   Dynamic balance training    Baird Kay, PT 11/06/2023, 5:12 PM

## 2023-11-08 ENCOUNTER — Ambulatory Visit: Payer: Medicare Other | Admitting: Physical Therapy

## 2023-11-08 DIAGNOSIS — R262 Difficulty in walking, not elsewhere classified: Secondary | ICD-10-CM

## 2023-11-08 DIAGNOSIS — M5459 Other low back pain: Secondary | ICD-10-CM

## 2023-11-08 DIAGNOSIS — R2681 Unsteadiness on feet: Secondary | ICD-10-CM

## 2023-11-08 DIAGNOSIS — R2689 Other abnormalities of gait and mobility: Secondary | ICD-10-CM

## 2023-11-08 DIAGNOSIS — R278 Other lack of coordination: Secondary | ICD-10-CM

## 2023-11-08 DIAGNOSIS — M6281 Muscle weakness (generalized): Secondary | ICD-10-CM

## 2023-11-08 NOTE — Therapy (Signed)
OUTPATIENT PHYSICAL THERAPY NEURO TREATMENT NOTE/  Re-Certification     Patient Name: Kristina Rowe MRN: [id number] DOB:11-26-1930, 89 y.o., female Today's Date: 11/08/2023   PCP: Enid Baas, MD  REFERRING PROVIDER:   Ignacia Bayley, PA-C    END OF SESSION:  PT End of Session - 11/08/23 1411     Visit Number 14    Number of Visits 38    Date for PT Re-Evaluation 01/31/24    Progress Note Due on Visit 20    PT Start Time 1404    PT Stop Time 1445    PT Time Calculation (min) 41 min    Equipment Utilized During Treatment Gait belt    Activity Tolerance Patient tolerated treatment well;No increased pain    Behavior During Therapy WFL for tasks assessed/performed                  Past Medical History:  Diagnosis Date   Ductal carcinoma in situ (DCIS) of left breast    Dysphagia    Esophageal stricture    Gastroparesis    GERD (gastroesophageal reflux disease)    Hiatal hernia    Hypertension    Iron deficiency anemia    Osteoporosis    Pulmonary embolism (HCC)    Scoliosis    UTI (urinary tract infection)    Past Surgical History:  Procedure Laterality Date   BREAST LUMPECTOMY Left 2009   Ductal Carcinoma insitu    CATARACT EXTRACTION, BILATERAL     COLONOSCOPY N/A 03/17/2021   Procedure: COLONOSCOPY;  Surgeon: Regis Bill, MD;  Location: [facility];  Service: Endoscopy;  Laterality: N/A;   COLOSTOMY REVISION Right 03/18/2021   Procedure: COLON RESECTION RIGHT;  Surgeon: Henrene Dodge, MD;  Location: [facility];  Service: General;  Laterality: Right;   LAPAROSCOPIC PARAESOPHAGEAL HERNIA REPAIR  2009   LAPAROTOMY N/A 03/10/2021   Procedure: EXPLORATORY LAPAROTOMY;  Surgeon: Henrene Dodge, MD;  Location: [facility];  Service: General;  Laterality: N/A;   TUBAL LIGATION     Patient Active Problem List   Diagnosis Date Noted   Acute on chronic diastolic CHF (congestive heart failure) (HCC) 08/19/2023   Acute respiratory failure with  hypoxia (HCC) 08/17/2023   History of pulmonary embolus (PE) 08/17/2023   Basal cell carcinoma of leg 04/01/2021   Coronary atherosclerosis 04/01/2021   History of pulmonary embolism 04/01/2021   Pulmonary nodule 04/01/2021   Malignant neoplasm of colon (HCC) 03/29/2021   Rectal bleeding 03/15/2021   Hypertension    GERD (gastroesophageal reflux disease)    Colonic mass    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HCC) 03/10/2021   Anemia, unspecified 03/06/2021   Bronchiectasis without complication (HCC) 03/27/2020   Iron deficiency anemia 03/27/2020   Moderate aortic stenosis 03/27/2020   Stage 3a chronic kidney disease (HCC) 07/04/2019   Hypnic headache 05/28/2019   Telogen effluvium 04/17/2019   Xerosis cutis 04/17/2019   Essential hypertension 01/14/2019   Cervical radiculopathy 05/04/2018   Osteoarthritis of left glenohumeral joint 05/04/2018   Vitamin B12 deficiency 01/01/2018   Vascular abnormality 09/11/2017   Dyspepsia 03/28/2017   Pulmonary embolism (HCC) 08/16/2016   Dilated pore of [place] 03/23/2015   Retinal drusen of both eyes 08/28/2014   Status post right cataract extraction 07/30/2014   Verruca 03/19/2014   Chronic back pain 11/06/2013   Postmenopausal osteoporosis 06/11/2013   Anticoagulated on Coumadin 04/06/2013   Long term (current) use of anticoagulants 10/18/2012   Preglaucoma 05/10/2012   Presbyopia 05/10/2012   Senile  nuclear sclerosis 05/10/2012   Lobular carcinoma in situ of breast 04/13/2012   Closed fracture of ankle 02/27/2012   History of nonmelanoma skin cancer 09/05/2011   Other benign neoplasm of skin of trunk 09/05/2011   Osteoporosis 08/25/2011   Transient alteration of awareness 09/03/2009   Colon adenoma 07/30/2002    ONSET DATE: 3 months   REFERRING DIAG:  Diagnosis  R53.83 (ICD-10-CM) - Other fatigue    THERAPY DIAG:  Muscle weakness (generalized)  Unsteadiness on feet  Difficulty in walking, not elsewhere classified  Other low back  pain  Other abnormalities of gait and mobility  Other lack of coordination  Rationale for Evaluation and Treatment: Rehabilitation  SUBJECTIVE:                                                                                                                                                                                             SUBJECTIVE STATEMENT: Pt reports that she felt bad for being late to last PT Treatment. Feels like she is getting better and is now confident to stand without UE support without AD . Wants to continue to PT at this time to allow improved function and reduced reliance on the BUE and rollator.    Pt accompanied by: family member  PERTINENT HISTORY:  From recent MD visit.  89 y.o. here for an acute issue. She has a PMH of chronic PE/Coumadin, anemia, colon cancer, breast cancer, large hiatal hernia, moderate aortic stenosis who has concerns about shortness of breath, nausea, generalized weakness. No chest pain. History of hiatal hernia and recent CT chest. Also recent endoscopy. Takes Zofran chronically. Has felt weaker and almost fell recently. Uses a walker. Also on chronic UTI suppression, Keflex.   PAIN:  Are you having pain? Yes: NPRS scale: 3/10 Pain location: low back  Pain description: sore  Aggravating factors: movement  Relieving factors: rest   PRECAUTIONS: Fall  RED FLAGS: None   WEIGHT BEARING RESTRICTIONS: No  FALLS: Has patient fallen in last 6 months? No  LIVING ENVIRONMENT: Lives with: lives alone Lives in: House/apartment Stairs: Yes: External: 1 steps; none Has following equipment at home: Walker - 4 wheeled  PLOF: Independent with household mobility with device and Independent with community mobility with device  PATIENT GOALS: improved strength in BLE   OBJECTIVE:   DIAGNOSTIC FINDINGS:  06/12/2023 EXAM: CT CHEST, ABDOMEN, AND PELVIS WITH CONTRAST IMPRESSION: 1. Status post right hemicolectomy, without recurrent or  metastatic disease. Decreased sensitivity exam, primarily involving the abdomen pelvis, due to paucity of fat. 2. New tiny left groin hernia containing fluid. Consider physical exam correlation. 3. Incidental  findings, including: Coronary artery atherosclerosis. Aortic Atherosclerosis (ICD10-I70.0). Dilated esophagus with contrast throughout, suggesting dysmotility and/or gastroesophageal reflux.  COGNITION: Overall cognitive status: Within functional limits for tasks assessed   SENSATION: Light touch: Impaired  mild paraesthesia in the proximal RLE   COORDINATION: WFL. Ankle to knee limited ROM due to weakness in the R hip      POSTURE: rounded shoulders, forward head, flexed trunk , and severe scoliosis     LOWER EXTREMITY MMT:    MMT Right Eval Left Eval  Hip flexion 4- 4  Hip extension    Hip abduction 4- 44  Hip adduction 4 4  Hip internal rotation    Hip external rotation    Knee flexion 4- 4  Knee extension 4 4+  Ankle dorsiflexion 4 4+  Ankle plantarflexion    Ankle inversion    Ankle eversion    (Blank rows = not tested)    TRANSFERS: Assistive device utilized: Environmental consultant - 4 wheeled  Sit to stand: SBA Stand to sit: SBA Chair to chair: SBA Floor:  Unable to perform    STAIRS: Level of Assistance:  pt declines Stair Negotiation Technique: Step to Pattern with Bilateral Rails Number of Stairs: 4  Height of Stairs: 6  Comments: will need to assess.   GAIT: Gait pattern: step through pattern, Right foot flat, and lateral hip instability Distance walked: 286ft Assistive device utilized: Walker - 4 wheeled Level of assistance: Modified independence and SBA Comments: severe scoliosis.    FUNCTIONAL TESTS:  5 times sit to stand: 30.86 Timed up and go (TUG): 29.39 2 minute walk test: 284ft  10 meter walk test: 0.612m/s, SOB 4-5.  Berg Balance Scale: TBD  PATIENT SURVEYS:  FOTO 56. goal 64  TODAY'S TREATMENT:                                                                                                                               DATE: 10/23/2023   Gait into  and through rehab gym with rollator 152ft, 29ft.   Pt performed 5 time sit<>stand (5xSTS): 14.12 sec with 1 UE on arm rest(>15 sec indicates increased fall risk)   10 Meter Walk Test: Patient instructed to walk 10 meters (32.8 ft) as quickly and as safely as possible at their normal speed x2 and at a fast speed x2. Time measured from 2 meter mark to 8 meter mark to accommodate ramp-up and ramp-down.  Normal speed 1: 12.16 sec Normal speed 2: 11.81 sec Average Normal speed: .834 m/s Fast speed 1: 9.43 sec Fast speed 2: 9.66 sec Average Fast speed: 1.05 m/s Cut off scores: <0.4 m/s = household Ambulator, 0.4-0.8 m/s = limited community Ambulator, >0.8 m/s = community Ambulator, >1.2 m/s = crossing a street, <1.0 = increased fall risk MCID 0.05 m/s (small), 0.13 m/s (moderate), 0.06 m/s (significant)  (ANPTA Core Set of Outcome Measures for Adults with Neurologic Conditions, 2018)  2 min walk test  performed. Increased to 369 ft.   PT instructed pt in TUG: 15.19 sec (average of 3 trials; >13.5 sec indicates increased fall risk)     PATIENT EDUCATION: Education details: Pt educated throughout session about proper posture and technique with exercises. Improved exercise technique, movement at target joints, use of target muscles after min to mod verbal, visual, tactile cues.  Person educated: Patient Education method: Explanation Education comprehension: verbalized understanding and returned demonstration  HOME EXERCISE PROGRAM: Access Code: [email or web address] URL: [email or web address] Date: 09/25/2023 Prepared by: Grier Rocher  Exercises - Seated Hip Abduction with Resistance  - 1 x daily - 4 x weekly - 3 sets - 15 reps - 3 hold - Seated March with Resistance  - 1 x daily - 4 x weekly - 3 sets - 10 reps - 1 hold - Seated Long Arc Quad  - 1 x daily - 4 x  weekly - 3 sets - 12 reps - 3 hold - Seated Hip Adduction Isometrics with Ball  - 1 x daily - 4 x weekly - 3 sets - 12 reps - 3 hold - Standing March with Counter Support  - 1 x daily - 3 x weekly - 2 sets - 10 reps - Standing Hip Abduction with Counter Support  - 1 x daily - 3 x weekly - 2 sets - 10 reps - Standing Hip Extension with Counter Support  - 1 x daily - 3 x weekly - 2 sets - 10 reps - Mini Squat with Counter Support  - 1 x daily - 3 x weekly - 2 sets - 10 reps - Standing Knee Flexion with Counter Support  - 1 x daily - 3 x weekly - 2 sets - 10 reps - Standing Tandem Balance with Counter Support  - 1 x daily - 3 x weekly - 2 sets - 4 reps - 10 hold  GOALS: Goals reviewed with patient? Yes   SHORT TERM GOALS: Target date: 12/28/23    Patient will be independent in home exercise program to improve strength/mobility for better functional independence with ADLs. Baseline:to be given at next session; 10/23/2023- Patient reports independence with HEP provided and no significant issues.   Goal status: MET 2. Pt will be able to ambulate for at least 5 min without stopping with LRAD to allow improved access to community and return to PLOF  Baseline: 3 minutes.   Goal Status: initial    LONG TERM GOALS: Target date: 01/31/2024    Patient will increase FOTO score to equal to or greater than  61   to demonstrate statistically significant improvement in mobility and quality of life.  Baseline: 56; 10/23/2023= 60 Goal status: PROGRESSING  2.  Patient (> 69 years old) will complete five times sit to stand test in < 15 seconds  without UE support indicating an increased LE strength and improved balance. Baseline: 30.86 sec; 10/23/2023= 22.08 using 1 UE support.  12/11: 14.67 sec BUE from arm rest. And 14.12 with 1 UE pushing from arm rest. Goal status: MET/ Adjusted   3.  Patient will increase Berg Balance score to >45 points to demonstrate decreased fall risk during functional  activities  Baseline: 26; 10/23/2023= 30 12/11: 34 Goal status: MET/ Adjusted   4.  Patient will increase 10 meter walk test to >1.4m/s as to improve gait speed for better community ambulation and to reduce fall risk. Baseline: 0.635m/s; 10/23/2023= 0.88 m/s (normal speed with 4WW) 12/11: Average Fast speed: 1.05 m/s Average Normal speed:  0.834 m/s Goal status: PROGRESSING  5.  Patient will reduce timed up and go to <11 seconds to reduce fall risk and demonstrate improved transfer/gait ability. Baseline: 29.39 12/2: 16.4 sec with Rollator  12/11: 15.19 sec with rollator   Goal status: IN PROGRESS  6.  Patient will 2 min walk test by at least 63ft as to demonstrate reduced fall risk and improved dynamic gait balance for better safety with community/home ambulation.   Baseline: 239ft; 10/23/2023= 360 feet  with 4WW 359ft with 4WW Goal status: MET  7. Pt will complete 6 min walk test without stopping and improve overall score by at least 149ft to indicate increased access to community.    Base[place]: to be complete  Goal: initial    ASSESSMENT: CLINICAL IMPRESSION: PT treatment focused on goal re-assessment for re-certification. Pt demonstrates improved balance, tolerance to gait and safety with home and community mobility, but still rates in high fall risk form score on TUG, 5xSTS, Berg and 2 min walk test. Patient's condition has the potential to improve in response to therapy. Maximum improvement is yet to be obtained. The anticipated improvement is attainable and reasonable in a generally predictable time. Pt will continue to benefit from skilled physical therapy intervention to address impairments, improve QOL, and attain therapy goals.     OBJECTIVE IMPAIRMENTS: Abnormal gait, cardiopulmonary status limiting activity, decreased activity tolerance, decreased balance, decreased endurance, decreased mobility, difficulty walking, decreased ROM, decreased strength, decreased safety  awareness, hypomobility, increased fascial restrictions, impaired flexibility, improper body mechanics, and postural dysfunction.   ACTIVITY LIMITATIONS: lifting, standing, squatting, transfers, and locomotion level  PARTICIPATION LIMITATIONS: laundry, shopping, and community activity  PERSONAL FACTORS: 3+ comorbidities: scoliosis, hx of CA, and PE  are also affecting patient's functional outcome.   REHAB POTENTIAL: Good  CLINICAL DECISION MAKING: Stable/uncomplicated  EVALUATION COMPLEXITY: Moderate  PLAN:  PT FREQUENCY: 1-2x/week  PT DURATION: 12 weeks  PLANNED INTERVENTIONS: Therapeutic exercises, Therapeutic activity, Neuromuscular re-education, Balance training, Gait training, Patient/Family education, Self Care, Joint mobilization, Stair training, Vestibular training, DME instructions, Dry Needling, Spinal mobilization, Cryotherapy, Moist heat, and Manual therapy  PLAN FOR NEXT SESSION:   Dynamic balance training. BLE and postural strengthening.    Golden Pop, PT 11/08/2023, 2:13 PM

## 2023-11-13 ENCOUNTER — Ambulatory Visit: Payer: Medicare Other

## 2023-11-13 DIAGNOSIS — M6281 Muscle weakness (generalized): Secondary | ICD-10-CM | POA: Diagnosis not present

## 2023-11-13 DIAGNOSIS — R2681 Unsteadiness on feet: Secondary | ICD-10-CM

## 2023-11-13 DIAGNOSIS — R262 Difficulty in walking, not elsewhere classified: Secondary | ICD-10-CM

## 2023-11-13 NOTE — Therapy (Signed)
OUTPATIENT PHYSICAL THERAPY NEURO TREATMENT NOTE/  Re-Certification     Patient Name: Kristina Rowe MRN: [id number] DOB:January 04, 1930, 89 y.o., female Today's Date: 11/13/2023   PCP: Enid Baas, MD  REFERRING PROVIDER:   Ignacia Bayley, PA-C    END OF SESSION:  PT End of Session - 11/13/23 1358     Visit Number 15    Number of Visits 38    Date for PT Re-Evaluation 01/31/24    Progress Note Due on Visit 20    PT Start Time 1401    PT Stop Time 1445    PT Time Calculation (min) 44 min    Equipment Utilized During Treatment Gait belt    Activity Tolerance Patient tolerated treatment well;No increased pain    Behavior During Therapy WFL for tasks assessed/performed                  Past Medical History:  Diagnosis Date   Ductal carcinoma in situ (DCIS) of left breast    Dysphagia    Esophageal stricture    Gastroparesis    GERD (gastroesophageal reflux disease)    Hiatal hernia    Hypertension    Iron deficiency anemia    Osteoporosis    Pulmonary embolism (HCC)    Scoliosis    UTI (urinary tract infection)    Past Surgical History:  Procedure Laterality Date   BREAST LUMPECTOMY Left 2009   Ductal Carcinoma insitu    CATARACT EXTRACTION, BILATERAL     COLONOSCOPY N/A 03/17/2021   Procedure: COLONOSCOPY;  Surgeon: Regis Bill, MD;  Location: [facility];  Service: Endoscopy;  Laterality: N/A;   COLOSTOMY REVISION Right 03/18/2021   Procedure: COLON RESECTION RIGHT;  Surgeon: Henrene Dodge, MD;  Location: [facility];  Service: General;  Laterality: Right;   LAPAROSCOPIC PARAESOPHAGEAL HERNIA REPAIR  2009   LAPAROTOMY N/A 03/10/2021   Procedure: EXPLORATORY LAPAROTOMY;  Surgeon: Henrene Dodge, MD;  Location: [facility];  Service: General;  Laterality: N/A;   TUBAL LIGATION     Patient Active Problem List   Diagnosis Date Noted   Acute on chronic diastolic CHF (congestive heart failure) (HCC) 08/19/2023   Acute respiratory failure with  hypoxia (HCC) 08/17/2023   History of pulmonary embolus (PE) 08/17/2023   Basal cell carcinoma of leg 04/01/2021   Coronary atherosclerosis 04/01/2021   History of pulmonary embolism 04/01/2021   Pulmonary nodule 04/01/2021   Malignant neoplasm of colon (HCC) 03/29/2021   Rectal bleeding 03/15/2021   Hypertension    GERD (gastroesophageal reflux disease)    Colonic mass    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HCC) 03/10/2021   Anemia, unspecified 03/06/2021   Bronchiectasis without complication (HCC) 03/27/2020   Iron deficiency anemia 03/27/2020   Moderate aortic stenosis 03/27/2020   Stage 3a chronic kidney disease (HCC) 07/04/2019   Hypnic headache 05/28/2019   Telogen effluvium 04/17/2019   Xerosis cutis 04/17/2019   Essential hypertension 01/14/2019   Cervical radiculopathy 05/04/2018   Osteoarthritis of left glenohumeral joint 05/04/2018   Vitamin B12 deficiency 01/01/2018   Vascular abnormality 09/11/2017   Dyspepsia 03/28/2017   Pulmonary embolism (HCC) 08/16/2016   Dilated pore of [place] 03/23/2015   Retinal drusen of both eyes 08/28/2014   Status post right cataract extraction 07/30/2014   Verruca 03/19/2014   Chronic back pain 11/06/2013   Postmenopausal osteoporosis 06/11/2013   Anticoagulated on Coumadin 04/06/2013   Long term (current) use of anticoagulants 10/18/2012   Preglaucoma 05/10/2012   Presbyopia 05/10/2012   Senile  nuclear sclerosis 05/10/2012   Lobular carcinoma in situ of breast 04/13/2012   Closed fracture of ankle 02/27/2012   History of nonmelanoma skin cancer 09/05/2011   Other benign neoplasm of skin of trunk 09/05/2011   Osteoporosis 08/25/2011   Transient alteration of awareness 09/03/2009   Colon adenoma 07/30/2002    ONSET DATE: 3 months   REFERRING DIAG:  Diagnosis  R53.83 (ICD-10-CM) - Other fatigue    THERAPY DIAG:  Muscle weakness (generalized)  Difficulty in walking, not elsewhere classified  Unsteadiness on feet  Rationale for  Evaluation and Treatment: Rehabilitation  SUBJECTIVE:                                                                                                                                                                                             SUBJECTIVE STATEMENT: Pt reports no new pain, but does report her chronic low back pain, rates mild. She reports no stumbles/falls. She reports HEP has been going well.   Pt accompanied by: family member  PERTINENT HISTORY:  From recent MD visit.  89 y.o. here for an acute issue. She has a PMH of chronic PE/Coumadin, anemia, colon cancer, breast cancer, large hiatal hernia, moderate aortic stenosis who has concerns about shortness of breath, nausea, generalized weakness. No chest pain. History of hiatal hernia and recent CT chest. Also recent endoscopy. Takes Zofran chronically. Has felt weaker and almost fell recently. Uses a walker. Also on chronic UTI suppression, Keflex.   PAIN:  Are you having pain? Yes: NPRS scale: 3/10 Pain location: low back  Pain description: sore  Aggravating factors: movement  Relieving factors: rest   PRECAUTIONS: Fall  RED FLAGS: None   WEIGHT BEARING RESTRICTIONS: No  FALLS: Has patient fallen in last 6 months? No  LIVING ENVIRONMENT: Lives with: lives alone Lives in: House/apartment Stairs: Yes: External: 1 steps; none Has following equipment at home: Walker - 4 wheeled  PLOF: Independent with household mobility with device and Independent with community mobility with device  PATIENT GOALS: improved strength in BLE   OBJECTIVE:   DIAGNOSTIC FINDINGS:  06/12/2023 EXAM: CT CHEST, ABDOMEN, AND PELVIS WITH CONTRAST IMPRESSION: 1. Status post right hemicolectomy, without recurrent or metastatic disease. Decreased sensitivity exam, primarily involving the abdomen pelvis, due to paucity of fat. 2. New tiny left groin hernia containing fluid. Consider physical exam correlation. 3. Incidental findings,  including: Coronary artery atherosclerosis. Aortic Atherosclerosis (ICD10-I70.0). Dilated esophagus with contrast throughout, suggesting dysmotility and/or gastroesophageal reflux.  COGNITION: Overall cognitive status: Within functional limits for tasks assessed   SENSATION: Light touch: Impaired  mild paraesthesia in the proximal RLE  COORDINATION: WFL. Ankle to knee limited ROM due to weakness in the R hip      POSTURE: rounded shoulders, forward head, flexed trunk , and severe scoliosis     LOWER EXTREMITY MMT:    MMT Right Eval Left Eval  Hip flexion 4- 4  Hip extension    Hip abduction 4- 44  Hip adduction 4 4  Hip internal rotation    Hip external rotation    Knee flexion 4- 4  Knee extension 4 4+  Ankle dorsiflexion 4 4+  Ankle plantarflexion    Ankle inversion    Ankle eversion    (Blank rows = not tested)    TRANSFERS: Assistive device utilized: Environmental consultant - 4 wheeled  Sit to stand: SBA Stand to sit: SBA Chair to chair: SBA Floor:  Unable to perform    STAIRS: Level of Assistance:  pt declines Stair Negotiation Technique: Step to Pattern with Bilateral Rails Number of Stairs: 4  Height of Stairs: 6  Comments: will need to assess.   GAIT: Gait pattern: step through pattern, Right foot flat, and lateral hip instability Distance walked: 266ft Assistive device utilized: Walker - 4 wheeled Level of assistance: Modified independence and SBA Comments: severe scoliosis.    FUNCTIONAL TESTS:  5 times sit to stand: 30.86 Timed up and go (TUG): 29.39 2 minute walk test: 235ft  10 meter walk test: 0.662m/s, SOB 4-5.  Berg Balance Scale: TBD  PATIENT SURVEYS:  FOTO 56. goal 64  TODAY'S TREATMENT:                                                                                                                              DATE:  11/13/23    Therex Sit to stand with minimal UE support 2x10, 1x5 - pt rates medium  With 2# AW each LE: Seated LAQ   x 1x14-15, 1x10 each LE  Rates challenging on RLE  Hip flexion x 2x15 each LE - improved ease with second set  Standing hip abduction 2x15 each LE Standing ankle PF/DF x 20 each LE at support surface HS curl 2 x 10 each LE - seated rest break taken as pt reports feeling a little dizzy  - quickly goes away with rest. HR 87-88 bpm  Seated stability ball rollout >upright with thoracic ext 10x with 3 sec hold/rep  Gait with 2# AW, 2x150 ft with Ue supported on Rollator. Slightly fatiguing, rest breaks between sets but no dizziness   STS 1x5    PATIENT EDUCATION: Education details: Pt educated throughout session about proper posture and technique with exercises. Improved exercise technique, movement at target joints, use of target muscles after min to mod verbal, visual, tactile cues.  Person educated: Patient Education method: Explanation Education comprehension: verbalized understanding and returned demonstration  HOME EXERCISE PROGRAM: Access Code: [email or web address] URL: [email or web address] Date: 09/25/2023 Prepared by: Grier Rocher  Exercises - Seated Hip Abduction with Resistance  - 1 x daily -  4 x weekly - 3 sets - 15 reps - 3 hold - Seated March with Resistance  - 1 x daily - 4 x weekly - 3 sets - 10 reps - 1 hold - Seated Long Arc Quad  - 1 x daily - 4 x weekly - 3 sets - 12 reps - 3 hold - Seated Hip Adduction Isometrics with Ball  - 1 x daily - 4 x weekly - 3 sets - 12 reps - 3 hold - Standing March with Counter Support  - 1 x daily - 3 x weekly - 2 sets - 10 reps - Standing Hip Abduction with Counter Support  - 1 x daily - 3 x weekly - 2 sets - 10 reps - Standing Hip Extension with Counter Support  - 1 x daily - 3 x weekly - 2 sets - 10 reps - Mini Squat with Counter Support  - 1 x daily - 3 x weekly - 2 sets - 10 reps - Standing Knee Flexion with Counter Support  - 1 x daily - 3 x weekly - 2 sets - 10 reps - Standing Tandem Balance with Counter Support  - 1 x daily  - 3 x weekly - 2 sets - 4 reps - 10 hold  GOALS: Goals reviewed with patient? Yes   SHORT TERM GOALS: Target date: 12/28/23    Patient will be independent in home exercise program to improve strength/mobility for better functional independence with ADLs. Baseline:to be given at next session; 10/23/2023- Patient reports independence with HEP provided and no significant issues.   Goal status: MET 2. Pt will be able to ambulate for at least 5 min without stopping with LRAD to allow improved access to community and return to PLOF  Baseline: 3 minutes.   Goal Status: initial    LONG TERM GOALS: Target date: 01/31/2024    Patient will increase FOTO score to equal to or greater than  61   to demonstrate statistically significant improvement in mobility and quality of life.  Baseline: 56; 10/23/2023= 60 Goal status: PROGRESSING  2.  Patient (> 83 years old) will complete five times sit to stand test in < 15 seconds  without UE support indicating an increased LE strength and improved balance. Baseline: 30.86 sec; 10/23/2023= 22.08 using 1 UE support.  12/11: 14.67 sec BUE from arm rest. And 14.12 with 1 UE pushing from arm rest. Goal status: MET/ Adjusted   3.  Patient will increase Berg Balance score to >45 points to demonstrate decreased fall risk during functional activities  Baseline: 26; 10/23/2023= 30 12/11: 34 Goal status: MET/ Adjusted   4.  Patient will increase 10 meter walk test to >1.67m/s as to improve gait speed for better community ambulation and to reduce fall risk. Baseline: 0.69m/s; 10/23/2023= 0.88 m/s (normal speed with 4WW) 12/11: Average Fast speed: 1.05 m/s Average Normal speed: 0.834 m/s Goal status: PROGRESSING  5.  Patient will reduce timed up and go to <11 seconds to reduce fall risk and demonstrate improved transfer/gait ability. Baseline: 29.39 12/2: 16.4 sec with Rollator  12/11: 15.19 sec with rollator   Goal status: IN PROGRESS  6.  Patient will 2  min walk test by at least 78ft as to demonstrate reduced fall risk and improved dynamic gait balance for better safety with community/home ambulation.   Baseline: 247ft; 10/23/2023= 360 feet  with 4WW 382ft with 4WW Goal status: MET  7. Pt will complete 6 min walk test without stopping and improve  overall score by at least 155ft to indicate increased access to community.    [place]: to be complete  Goal: initial    ASSESSMENT: CLINICAL IMPRESSION: Continued with endurance/LE strengthening interventions today.  Pt tolerated these well, only experienced slight dizziness following standing hamstring curls, however, this improved quickly with seated rest, HR WNL for exercising. No other dizziness/symptoms present with remaining interventions. Pt will continue to benefit from skilled physical therapy intervention to address impairments, improve QOL, and attain therapy goals.     OBJECTIVE IMPAIRMENTS: Abnormal gait, cardiopulmonary status limiting activity, decreased activity tolerance, decreased balance, decreased endurance, decreased mobility, difficulty walking, decreased ROM, decreased strength, decreased safety awareness, hypomobility, increased fascial restrictions, impaired flexibility, improper body mechanics, and postural dysfunction.   ACTIVITY LIMITATIONS: lifting, standing, squatting, transfers, and locomotion level  PARTICIPATION LIMITATIONS: laundry, shopping, and community activity  PERSONAL FACTORS: 3+ comorbidities: scoliosis, hx of CA, and PE  are also affecting patient's functional outcome.   REHAB POTENTIAL: Good  CLINICAL DECISION MAKING: Stable/uncomplicated  EVALUATION COMPLEXITY: Moderate  PLAN:  PT FREQUENCY: 1-2x/week  PT DURATION: 12 weeks  PLANNED INTERVENTIONS: Therapeutic exercises, Therapeutic activity, Neuromuscular re-education, Balance training, Gait training, Patient/Family education, Self Care, Joint mobilization, Stair training, Vestibular  training, DME instructions, Dry Needling, Spinal mobilization, Cryotherapy, Moist heat, and Manual therapy  PLAN FOR NEXT SESSION:   Dynamic balance training. BLE and postural strengthening.    Baird Kay, PT 11/13/2023, 2:51 PM

## 2023-11-15 ENCOUNTER — Ambulatory Visit: Payer: Medicare Other | Admitting: Physical Therapy

## 2023-11-15 DIAGNOSIS — M5459 Other low back pain: Secondary | ICD-10-CM

## 2023-11-15 DIAGNOSIS — R2681 Unsteadiness on feet: Secondary | ICD-10-CM

## 2023-11-15 DIAGNOSIS — R2689 Other abnormalities of gait and mobility: Secondary | ICD-10-CM

## 2023-11-15 DIAGNOSIS — R262 Difficulty in walking, not elsewhere classified: Secondary | ICD-10-CM

## 2023-11-15 DIAGNOSIS — R278 Other lack of coordination: Secondary | ICD-10-CM

## 2023-11-15 DIAGNOSIS — M6281 Muscle weakness (generalized): Secondary | ICD-10-CM | POA: Diagnosis not present

## 2023-11-15 NOTE — Therapy (Signed)
OUTPATIENT PHYSICAL THERAPY NEURO TREATMENT NOTE   Patient Name: Kristina Rowe MRN: [id number] DOB:04/15/1930, 89 y.o., female Today's Date: 11/15/2023   PCP: Enid Baas, MD  REFERRING PROVIDER:   Ignacia Bayley, PA-C    END OF SESSION:  PT End of Session - 11/15/23 1402     Visit Number 16    Number of Visits 38    Date for PT Re-Evaluation 01/31/24    Progress Note Due on Visit 20    PT Start Time 1404    PT Stop Time 1445    PT Time Calculation (min) 41 min    Equipment Utilized During Treatment Gait belt    Activity Tolerance Patient tolerated treatment well;No increased pain    Behavior During Therapy WFL for tasks assessed/performed                  Past Medical History:  Diagnosis Date   Ductal carcinoma in situ (DCIS) of left breast    Dysphagia    Esophageal stricture    Gastroparesis    GERD (gastroesophageal reflux disease)    Hiatal hernia    Hypertension    Iron deficiency anemia    Osteoporosis    Pulmonary embolism (HCC)    Scoliosis    UTI (urinary tract infection)    Past Surgical History:  Procedure Laterality Date   BREAST LUMPECTOMY Left 2009   Ductal Carcinoma insitu    CATARACT EXTRACTION, BILATERAL     COLONOSCOPY N/A 03/17/2021   Procedure: COLONOSCOPY;  Surgeon: Regis Bill, MD;  Location: [facility];  Service: Endoscopy;  Laterality: N/A;   COLOSTOMY REVISION Right 03/18/2021   Procedure: COLON RESECTION RIGHT;  Surgeon: Henrene Dodge, MD;  Location: [facility];  Service: General;  Laterality: Right;   LAPAROSCOPIC PARAESOPHAGEAL HERNIA REPAIR  2009   LAPAROTOMY N/A 03/10/2021   Procedure: EXPLORATORY LAPAROTOMY;  Surgeon: Henrene Dodge, MD;  Location: [facility];  Service: General;  Laterality: N/A;   TUBAL LIGATION     Patient Active Problem List   Diagnosis Date Noted   Acute on chronic diastolic CHF (congestive heart failure) (HCC) 08/19/2023   Acute respiratory failure with hypoxia (HCC) 08/17/2023    History of pulmonary embolus (PE) 08/17/2023   Basal cell carcinoma of leg 04/01/2021   Coronary atherosclerosis 04/01/2021   History of pulmonary embolism 04/01/2021   Pulmonary nodule 04/01/2021   Malignant neoplasm of colon (HCC) 03/29/2021   Rectal bleeding 03/15/2021   Hypertension    GERD (gastroesophageal reflux disease)    Colonic mass    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HCC) 03/10/2021   Anemia, unspecified 03/06/2021   Bronchiectasis without complication (HCC) 03/27/2020   Iron deficiency anemia 03/27/2020   Moderate aortic stenosis 03/27/2020   Stage 3a chronic kidney disease (HCC) 07/04/2019   Hypnic headache 05/28/2019   Telogen effluvium 04/17/2019   Xerosis cutis 04/17/2019   Essential hypertension 01/14/2019   Cervical radiculopathy 05/04/2018   Osteoarthritis of left glenohumeral joint 05/04/2018   Vitamin B12 deficiency 01/01/2018   Vascular abnormality 09/11/2017   Dyspepsia 03/28/2017   Pulmonary embolism (HCC) 08/16/2016   Dilated pore of [place] 03/23/2015   Retinal drusen of both eyes 08/28/2014   Status post right cataract extraction 07/30/2014   Verruca 03/19/2014   Chronic back pain 11/06/2013   Postmenopausal osteoporosis 06/11/2013   Anticoagulated on Coumadin 04/06/2013   Long term (current) use of anticoagulants 10/18/2012   Preglaucoma 05/10/2012   Presbyopia 05/10/2012   Senile nuclear sclerosis 05/10/2012  Lobular carcinoma in situ of breast 04/13/2012   Closed fracture of ankle 02/27/2012   History of nonmelanoma skin cancer 09/05/2011   Other benign neoplasm of skin of trunk 09/05/2011   Osteoporosis 08/25/2011   Transient alteration of awareness 09/03/2009   Colon adenoma 07/30/2002    ONSET DATE: 3 months   REFERRING DIAG:  Diagnosis  R53.83 (ICD-10-CM) - Other fatigue    THERAPY DIAG:  Muscle weakness (generalized)  Difficulty in walking, not elsewhere classified  Unsteadiness on feet  Other low back pain  Other abnormalities of  gait and mobility  Other lack of coordination  Rationale for Evaluation and Treatment: Rehabilitation  SUBJECTIVE:                                                                                                                                                                                             SUBJECTIVE STATEMENT:  Pt reports continued back pain rates 5/10 on this day. Had a busy day celebrating her birthday.   Pt accompanied by: family member  PERTINENT HISTORY:  From recent MD visit.  89 y.o. here for an acute issue. She has a PMH of chronic PE/Coumadin, anemia, colon cancer, breast cancer, large hiatal hernia, moderate aortic stenosis who has concerns about shortness of breath, nausea, generalized weakness. No chest pain. History of hiatal hernia and recent CT chest. Also recent endoscopy. Takes Zofran chronically. Has felt weaker and almost fell recently. Uses a walker. Also on chronic UTI suppression, Keflex.   PAIN:  Are you having pain? Yes: NPRS scale: 5/10 Pain location: low back  Pain description: sore  Aggravating factors: movement  Relieving factors: rest   PRECAUTIONS: Fall  RED FLAGS: None   WEIGHT BEARING RESTRICTIONS: No  FALLS: Has patient fallen in last 6 months? No  LIVING ENVIRONMENT: Lives with: lives alone Lives in: House/apartment Stairs: Yes: External: 1 steps; none Has following equipment at home: Walker - 4 wheeled  PLOF: Independent with household mobility with device and Independent with community mobility with device  PATIENT GOALS: improved strength in BLE   OBJECTIVE:   DIAGNOSTIC FINDINGS:  06/12/2023 EXAM: CT CHEST, ABDOMEN, AND PELVIS WITH CONTRAST IMPRESSION: 1. Status post right hemicolectomy, without recurrent or metastatic disease. Decreased sensitivity exam, primarily involving the abdomen pelvis, due to paucity of fat. 2. New tiny left groin hernia containing fluid. Consider physical exam correlation. 3. Incidental  findings, including: Coronary artery atherosclerosis. Aortic Atherosclerosis (ICD10-I70.0). Dilated esophagus with contrast throughout, suggesting dysmotility and/or gastroesophageal reflux.  COGNITION: Overall cognitive status: Within functional limits for tasks assessed   SENSATION: Light touch: Impaired  mild paraesthesia in  the proximal RLE   COORDINATION: WFL. Ankle to knee limited ROM due to weakness in the R hip      POSTURE: rounded shoulders, forward head, flexed trunk , and severe scoliosis     LOWER EXTREMITY MMT:    MMT Right Eval Left Eval  Hip flexion 4- 4  Hip extension    Hip abduction 4- 44  Hip adduction 4 4  Hip internal rotation    Hip external rotation    Knee flexion 4- 4  Knee extension 4 4+  Ankle dorsiflexion 4 4+  Ankle plantarflexion    Ankle inversion    Ankle eversion    (Blank rows = not tested)    TRANSFERS: Assistive device utilized: Environmental consultant - 4 wheeled  Sit to stand: SBA Stand to sit: SBA Chair to chair: SBA Floor:  Unable to perform    STAIRS: Level of Assistance:  pt declines Stair Negotiation Technique: Step to Pattern with Bilateral Rails Number of Stairs: 4  Height of Stairs: 6  Comments: will need to assess.   GAIT: Gait pattern: step through pattern, Right foot flat, and lateral hip instability Distance walked: 259ft Assistive device utilized: Walker - 4 wheeled Level of assistance: Modified independence and SBA Comments: severe scoliosis.    FUNCTIONAL TESTS:  5 times sit to stand: 30.86 Timed up and go (TUG): 29.39 2 minute walk test: 232ft  10 meter walk test: 0.666m/s, SOB 4-5.  Berg Balance Scale: TBD  PATIENT SURVEYS:  FOTO 56. goal 64  TODAY'S TREATMENT:                                                                                                                              DATE:  11/15/23    Therex  -Standing on airex pad 3 x 30 sec without UE support.  -Ball tap with BUE on parallel  bars alternating R and L x 10 -bil  -Gait without UE support in parallel  55ft x 4 -Forward/reverse without UE support 62ft x 2 each.  -Side stepping R and L with UE support x 8 bil and without UE support x 8 bil   -figure 8 with RW 40ft x 3 - figure 8 without UE support x 2 with min assist from PT for safety.  Sit<>stand with UE pushing from arm rest x 2.   Cues for improved posture and step length in all directions as tolerated. Poor toe off when ambulating without UE support on rails or RW.    PATIENT EDUCATION: Education details: Pt educated throughout session about proper posture and technique with exercises. Improved exercise technique, movement at target joints, use of target muscles after min to mod verbal, visual, tactile cues.  Person educated: Patient Education method: Explanation Education comprehension: verbalized understanding and returned demonstration  HOME EXERCISE PROGRAM: Access Code: [email or web address] URL: [email or web address] Date: 09/25/2023 Prepared by: Grier Rocher  Exercises - Seated Hip Abduction with Resistance  - 1 x daily -  4 x weekly - 3 sets - 15 reps - 3 hold - Seated March with Resistance  - 1 x daily - 4 x weekly - 3 sets - 10 reps - 1 hold - Seated Long Arc Quad  - 1 x daily - 4 x weekly - 3 sets - 12 reps - 3 hold - Seated Hip Adduction Isometrics with Ball  - 1 x daily - 4 x weekly - 3 sets - 12 reps - 3 hold - Standing March with Counter Support  - 1 x daily - 3 x weekly - 2 sets - 10 reps - Standing Hip Abduction with Counter Support  - 1 x daily - 3 x weekly - 2 sets - 10 reps - Standing Hip Extension with Counter Support  - 1 x daily - 3 x weekly - 2 sets - 10 reps - Mini Squat with Counter Support  - 1 x daily - 3 x weekly - 2 sets - 10 reps - Standing Knee Flexion with Counter Support  - 1 x daily - 3 x weekly - 2 sets - 10 reps - Standing Tandem Balance with Counter Support  - 1 x daily - 3 x weekly - 2 sets - 4 reps - 10  hold  GOALS: Goals reviewed with patient? Yes   SHORT TERM GOALS: Target date: 12/28/23    Patient will be independent in home exercise program to improve strength/mobility for better functional independence with ADLs. Baseline:to be given at next session; 10/23/2023- Patient reports independence with HEP provided and no significant issues.   Goal status: MET 2. Pt will be able to ambulate for at least 5 min without stopping with LRAD to allow improved access to community and return to PLOF  Baseline: 3 minutes.   Goal Status: initial    LONG TERM GOALS: Target date: 01/31/2024    Patient will increase FOTO score to equal to or greater than  61   to demonstrate statistically significant improvement in mobility and quality of life.  Baseline: 56; 10/23/2023= 60 Goal status: PROGRESSING  2.  Patient (> 49 years old) will complete five times sit to stand test in < 15 seconds  without UE support indicating an increased LE strength and improved balance. Baseline: 30.86 sec; 10/23/2023= 22.08 using 1 UE support.  12/11: 14.67 sec BUE from arm rest. And 14.12 with 1 UE pushing from arm rest. Goal status: MET/ Adjusted   3.  Patient will increase Berg Balance score to >45 points to demonstrate decreased fall risk during functional activities  Baseline: 26; 10/23/2023= 30 12/11: 34 Goal status: MET/ Adjusted   4.  Patient will increase 10 meter walk test to >1.51m/s as to improve gait speed for better community ambulation and to reduce fall risk. Baseline: 0.674m/s; 10/23/2023= 0.88 m/s (normal speed with 4WW) 12/11: Average Fast speed: 1.05 m/s Average Normal speed: 0.834 m/s Goal status: PROGRESSING  5.  Patient will reduce timed up and go to <11 seconds to reduce fall risk and demonstrate improved transfer/gait ability. Baseline: 29.39 12/2: 16.4 sec with Rollator  12/11: 15.19 sec with rollator   Goal status: IN PROGRESS  6.  Patient will 2 min walk test by at least 34ft as to  demonstrate reduced fall risk and improved dynamic gait balance for better safety with community/home ambulation.   Baseline: 226ft; 10/23/2023= 360 feet  with 4WW 382ft with 4WW Goal status: MET  7. Pt will complete 6 min walk test without stopping and improve  overall score by at least 137ft to indicate increased access to community.    BaseLine: to be complete  Goal: initial    ASSESSMENT: CLINICAL IMPRESSION: PT treatment focused on tolerance to standing, functional movement training and dynamic balance. Pt demonstrated improved step length and posture with increased distance ambulating without UE support. Continued to demonstrate decreased step length in turn due to increased fear of falling.  Pt will continue to benefit from skilled physical therapy intervention to address impairments, improve QOL, and attain therapy goals.     OBJECTIVE IMPAIRMENTS: Abnormal gait, cardiopulmonary status limiting activity, decreased activity tolerance, decreased balance, decreased endurance, decreased mobility, difficulty walking, decreased ROM, decreased strength, decreased safety awareness, hypomobility, increased fascial restrictions, impaired flexibility, improper body mechanics, and postural dysfunction.   ACTIVITY LIMITATIONS: lifting, standing, squatting, transfers, and locomotion level  PARTICIPATION LIMITATIONS: laundry, shopping, and community activity  PERSONAL FACTORS: 3+ comorbidities: scoliosis, hx of CA, and PE  are also affecting patient's functional outcome.   REHAB POTENTIAL: Good  CLINICAL DECISION MAKING: Stable/uncomplicated  EVALUATION COMPLEXITY: Moderate  PLAN:  PT FREQUENCY: 1-2x/week  PT DURATION: 12 weeks  PLANNED INTERVENTIONS: Therapeutic exercises, Therapeutic activity, Neuromuscular re-education, Balance training, Gait training, Patient/Family education, Self Care, Joint mobilization, Stair training, Vestibular training, DME instructions, Dry Needling, Spinal  mobilization, Cryotherapy, Moist heat, and Manual therapy  PLAN FOR NEXT SESSION:   Dynamic balance training. BLE and postural strengthening.    Golden Pop, PT 11/15/2023, 2:07 PM

## 2023-11-27 ENCOUNTER — Ambulatory Visit: Payer: Medicare Other | Admitting: Physician Assistant

## 2023-11-27 VITALS — BP 184/88 | HR 68 | Ht 60.0 in | Wt 87.0 lb

## 2023-11-27 DIAGNOSIS — N390 Urinary tract infection, site not specified: Secondary | ICD-10-CM

## 2023-11-27 DIAGNOSIS — N3281 Overactive bladder: Secondary | ICD-10-CM | POA: Diagnosis not present

## 2023-11-27 DIAGNOSIS — Z87440 Personal history of urinary (tract) infections: Secondary | ICD-10-CM | POA: Diagnosis not present

## 2023-11-27 MED ORDER — ESTRADIOL 0.1 MG/GM VA CREA: TOPICAL_CREAM | VAGINAL | 5 refills | Status: AC

## 2023-11-28 LAB — URINALYSIS, COMPLETE
Bilirubin, UA: NEGATIVE
Glucose, UA: NEGATIVE
Ketones, UA: NEGATIVE
Leukocytes,UA: NEGATIVE
Nitrite, UA: NEGATIVE
Protein,UA: NEGATIVE
RBC, UA: NEGATIVE
Specific Gravity, UA: 1.01 (ref 1.005–1.030)
Urobilinogen, Ur: 0.2 mg/dL (ref 0.2–1.0)
pH, UA: 6.5 (ref 5.0–7.5)

## 2023-11-28 LAB — MICROSCOPIC EXAMINATION

## 2023-12-04 ENCOUNTER — Ambulatory Visit: Payer: Medicare Other | Attending: Physician Assistant | Admitting: Physical Therapy

## 2023-12-04 DIAGNOSIS — M6281 Muscle weakness (generalized): Secondary | ICD-10-CM | POA: Insufficient documentation

## 2023-12-04 DIAGNOSIS — R262 Difficulty in walking, not elsewhere classified: Secondary | ICD-10-CM | POA: Diagnosis present

## 2023-12-04 DIAGNOSIS — M5459 Other low back pain: Secondary | ICD-10-CM | POA: Insufficient documentation

## 2023-12-04 DIAGNOSIS — R2681 Unsteadiness on feet: Secondary | ICD-10-CM | POA: Insufficient documentation

## 2023-12-04 DIAGNOSIS — R278 Other lack of coordination: Secondary | ICD-10-CM | POA: Insufficient documentation

## 2023-12-04 DIAGNOSIS — R2689 Other abnormalities of gait and mobility: Secondary | ICD-10-CM | POA: Diagnosis present

## 2023-12-04 NOTE — Therapy (Signed)
 OUTPATIENT PHYSICAL THERAPY NEURO TREATMENT NOTE   Patient Name: Kristina Rowe MRN: [id number] DOB:29-Oct-1930, 89 y.o., female Today's Date: 12/04/2023   PCP: Sherial Bail, MD  REFERRING PROVIDER:   Suzzane Charleston, PA-C    END OF SESSION:  PT End of Session - 12/04/23 1602     Visit Number 17    Number of Visits 38    Date for PT Re-Evaluation 01/31/24    Progress Note Due on Visit 20    PT Start Time 1601    PT Stop Time 1643    PT Time Calculation (min) 42 min    Equipment Utilized During Treatment Gait belt    Activity Tolerance Patient tolerated treatment well;No increased pain    Behavior During Therapy WFL for tasks assessed/performed                   Past Medical History:  Diagnosis Date   Ductal carcinoma in situ (DCIS) of left breast    Dysphagia    Esophageal stricture    Gastroparesis    GERD (gastroesophageal reflux disease)    Hiatal hernia    Hypertension    Iron deficiency anemia    Osteoporosis    Pulmonary embolism (HCC)    Scoliosis    UTI (urinary tract infection)    Past Surgical History:  Procedure Laterality Date   BREAST LUMPECTOMY Left 2009   Ductal Carcinoma insitu    CATARACT EXTRACTION, BILATERAL     COLONOSCOPY N/A 03/17/2021   Procedure: COLONOSCOPY;  Surgeon: Maryruth Ole DASEN, MD;  Location: [facility];  Service: Endoscopy;  Laterality: N/A;   COLOSTOMY REVISION Right 03/18/2021   Procedure: COLON RESECTION RIGHT;  Surgeon: Desiderio Schanz, MD;  Location: [facility];  Service: General;  Laterality: Right;   LAPAROSCOPIC PARAESOPHAGEAL HERNIA REPAIR  2009   LAPAROTOMY N/A 03/10/2021   Procedure: EXPLORATORY LAPAROTOMY;  Surgeon: Desiderio Schanz, MD;  Location: [facility];  Service: General;  Laterality: N/A;   TUBAL LIGATION     Patient Active Problem List   Diagnosis Date Noted   Acute on chronic diastolic CHF (congestive heart failure) (HCC) 08/19/2023   Acute respiratory failure with hypoxia (HCC) 08/17/2023    History of pulmonary embolus (PE) 08/17/2023   Basal cell carcinoma of leg 04/01/2021   Coronary atherosclerosis 04/01/2021   History of pulmonary embolism 04/01/2021   Pulmonary nodule 04/01/2021   Malignant neoplasm of colon (HCC) 03/29/2021   Rectal bleeding 03/15/2021   Hypertension    GERD (gastroesophageal reflux disease)    Colonic mass    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HCC) 03/10/2021   Anemia, unspecified 03/06/2021   Bronchiectasis without complication (HCC) 03/27/2020   Iron deficiency anemia 03/27/2020   Moderate aortic stenosis 03/27/2020   Stage 3a chronic kidney disease (HCC) 07/04/2019   Hypnic headache 05/28/2019   Telogen effluvium 04/17/2019   Xerosis cutis 04/17/2019   Essential hypertension 01/14/2019   Cervical radiculopathy 05/04/2018   Osteoarthritis of left glenohumeral joint 05/04/2018   Vitamin B12 deficiency 01/01/2018   Vascular abnormality 09/11/2017   Dyspepsia 03/28/2017   Pulmonary embolism (HCC) 08/16/2016   Dilated pore of [place] 03/23/2015   Retinal drusen of both eyes 08/28/2014   Status post right cataract extraction 07/30/2014   Verruca 03/19/2014   Chronic back pain 11/06/2013   Postmenopausal osteoporosis 06/11/2013   Anticoagulated on Coumadin  04/06/2013   Long term (current) use of anticoagulants 10/18/2012   Preglaucoma 05/10/2012   Presbyopia 05/10/2012   Senile nuclear sclerosis 05/10/2012  Lobular carcinoma in situ of breast 04/13/2012   Closed fracture of ankle 02/27/2012   History of nonmelanoma skin cancer 09/05/2011   Other benign neoplasm of skin of trunk 09/05/2011   Osteoporosis 08/25/2011   Transient alteration of awareness 09/03/2009   Colon adenoma 07/30/2002    ONSET DATE: 3 months   REFERRING DIAG:  Diagnosis  R53.83 (ICD-10-CM) - Other fatigue    THERAPY DIAG:  No diagnosis found.  Rationale for Evaluation and Treatment: Rehabilitation  SUBJECTIVE:                                                                                                                                                                                              SUBJECTIVE STATEMENT:  Pt reports she had a bad visit with her cardiologist ( she did not like him) and reports she did not like the recommended medication. She will wait and see if she can get more advice from Dr. Sherial when she sees him again.   Pt accompanied by: family member  PERTINENT HISTORY:  From recent MD visit.  89 y.o. here for an acute issue. She has a PMH of chronic PE/Coumadin , anemia, colon cancer, breast cancer, large hiatal hernia, moderate aortic stenosis who has concerns about shortness of breath, nausea, generalized weakness. No chest pain. History of hiatal hernia and recent CT chest. Also recent endoscopy. Takes Zofran  chronically. Has felt weaker and almost fell recently. Uses a walker. Also on chronic UTI suppression, Keflex .   PAIN:  Are you having pain? Yes: NPRS scale: 5/10 Pain location: low back  Pain description: sore  Aggravating factors: movement  Relieving factors: rest   PRECAUTIONS: Fall  RED FLAGS: None   WEIGHT BEARING RESTRICTIONS: No  FALLS: Has patient fallen in last 6 months? No  LIVING ENVIRONMENT: Lives with: lives alone Lives in: House/apartment Stairs: Yes: External: 1 steps; none Has following equipment at home: Walker - 4 wheeled  PLOF: Independent with household mobility with device and Independent with community mobility with device  PATIENT GOALS: improved strength in BLE   OBJECTIVE:   DIAGNOSTIC FINDINGS:  06/12/2023 EXAM: CT CHEST, ABDOMEN, AND PELVIS WITH CONTRAST IMPRESSION: 1. Status post right hemicolectomy, without recurrent or metastatic disease. Decreased sensitivity exam, primarily involving the abdomen pelvis, due to paucity of fat. 2. New tiny left groin hernia containing fluid. Consider physical exam correlation. 3. Incidental findings, including: Coronary artery  atherosclerosis. Aortic Atherosclerosis (ICD10-I70.0). Dilated esophagus with contrast throughout, suggesting dysmotility and/or gastroesophageal reflux.  COGNITION: Overall cognitive status: Within functional limits for tasks assessed   SENSATION: Light touch: Impaired  mild paraesthesia in the  proximal RLE   COORDINATION: WFL. Ankle to knee limited ROM due to weakness in the R hip      POSTURE: rounded shoulders, forward head, flexed trunk , and severe scoliosis     LOWER EXTREMITY MMT:    MMT Right Eval Left Eval  Hip flexion 4- 4  Hip extension    Hip abduction 4- 44  Hip adduction 4 4  Hip internal rotation    Hip external rotation    Knee flexion 4- 4  Knee extension 4 4+  Ankle dorsiflexion 4 4+  Ankle plantarflexion    Ankle inversion    Ankle eversion    (Blank rows = not tested)    TRANSFERS: Assistive device utilized: Environmental Consultant - 4 wheeled  Sit to stand: SBA Stand to sit: SBA Chair to chair: SBA Floor:  Unable to perform    STAIRS: Level of Assistance:  pt declines Stair Negotiation Technique: Step to Pattern with Bilateral Rails Number of Stairs: 4  Height of Stairs: 6  Comments: will need to assess.   GAIT: Gait pattern: step through pattern, Right foot flat, and lateral hip instability Distance walked: 224ft Assistive device utilized: Walker - 4 wheeled Level of assistance: Modified independence and SBA Comments: severe scoliosis.    FUNCTIONAL TESTS:  5 times sit to stand: 30.86 Timed up and go (TUG): 29.39 2 minute walk test: 262ft  10 meter walk test: 0.636m/s, SOB 4-5.  Berg Balance Scale: TBD  PATIENT SURVEYS:  FOTO 56. goal 64  TODAY'S TREATMENT:                                                                                                                              DATE:  12/04/23    TherAct   -Gait in parallel  74ft x 4 -Forward gait without UE support on final 3 laps ( first lap with U UE support).  -Side  stepping R and L with UE support x 8 bil  With 2.5# AW step taps 2 x 10 ea LE   Ambulation with 5TT and 2.5# AW weaving between exercise machines x 3 ( rest then second set)   Ambulation with 4WW and 2.5# AW Forward then retro gait x 30 ft with RW ( slow retro gait taking small steps Pt session was cut a little short today due to pt arriving late to scheduled appointment time   Cues for improved posture and step length in all directions as tolerated. Poor toe off when ambulating without UE support on rails or RW.    PATIENT EDUCATION: Education details: Pt educated throughout session about proper posture and technique with exercises. Improved exercise technique, movement at target joints, use of target muscles after min to mod verbal, visual, tactile cues.  Person educated: Patient Education method: Explanation Education comprehension: verbalized understanding and returned demonstration  HOME EXERCISE PROGRAM: Access Code: [email or web address] URL: [email or web address] Date: 09/25/2023 Prepared by: Massie Dollar  Exercises - Seated Hip  Abduction with Resistance  - 1 x daily - 4 x weekly - 3 sets - 15 reps - 3 hold - Seated March with Resistance  - 1 x daily - 4 x weekly - 3 sets - 10 reps - 1 hold - Seated Long Arc Quad  - 1 x daily - 4 x weekly - 3 sets - 12 reps - 3 hold - Seated Hip Adduction Isometrics with Ball  - 1 x daily - 4 x weekly - 3 sets - 12 reps - 3 hold - Standing March with Counter Support  - 1 x daily - 3 x weekly - 2 sets - 10 reps - Standing Hip Abduction with Counter Support  - 1 x daily - 3 x weekly - 2 sets - 10 reps - Standing Hip Extension with Counter Support  - 1 x daily - 3 x weekly - 2 sets - 10 reps - Mini Squat with Counter Support  - 1 x daily - 3 x weekly - 2 sets - 10 reps - Standing Knee Flexion with Counter Support  - 1 x daily - 3 x weekly - 2 sets - 10 reps - Standing Tandem Balance with Counter Support  - 1 x daily - 3 x weekly - 2 sets - 4  reps - 10 hold  GOALS: Goals reviewed with patient? Yes   SHORT TERM GOALS: Target date: 12/28/23    Patient will be independent in home exercise program to improve strength/mobility for better functional independence with ADLs. Baseline:to be given at next session; 10/23/2023- Patient reports independence with HEP provided and no significant issues.   Goal status: MET 2. Pt will be able to ambulate for at least 5 min without stopping with LRAD to allow improved access to community and return to PLOF  Baseline: 3 minutes.   Goal Status: initial    LONG TERM GOALS: Target date: 01/31/2024    Patient will increase FOTO score to equal to or greater than  61   to demonstrate statistically significant improvement in mobility and quality of life.  Baseline: 56; 10/23/2023= 60 Goal status: PROGRESSING  2.  Patient (> 38 years old) will complete five times sit to stand test in < 15 seconds  without UE support indicating an increased LE strength and improved balance. Baseline: 30.86 sec; 10/23/2023= 22.08 using 1 UE support.  12/11: 14.67 sec BUE from arm rest. And 14.12 with 1 UE pushing from arm rest. Goal status: MET/ Adjusted   3.  Patient will increase Berg Balance score to >45 points to demonstrate decreased fall risk during functional activities  Baseline: 26; 10/23/2023= 30 12/11: 34 Goal status: MET/ Adjusted   4.  Patient will increase 10 meter walk test to >1.84m/s as to improve gait speed for better community ambulation and to reduce fall risk. Baseline: 0.671m/s; 10/23/2023= 0.88 m/s (normal speed with 4WW) 12/11: Average Fast speed: 1.05 m/s Average Normal speed: 0.834 m/s Goal status: PROGRESSING  5.  Patient will reduce timed up and go to <11 seconds to reduce fall risk and demonstrate improved transfer/gait ability. Baseline: 29.39 12/2: 16.4 sec with Rollator  12/11: 15.19 sec with rollator   Goal status: IN PROGRESS  6.  Patient will 2 min walk test by at least  77ft as to demonstrate reduced fall risk and improved dynamic gait balance for better safety with community/home ambulation.   Baseline: 2100ft; 10/23/2023= 360 feet  with 4WW 316ft with 4WW Goal status: MET  7. Pt will  complete 6 min walk test without stopping and improve overall score by at least 167ft to indicate increased access to community.    BaseLine: to be complete  Goal: initial    ASSESSMENT: CLINICAL IMPRESSION:   Patient presents with good motivation for completion of physical therapy activities.  Patient arrives a few minutes late to today's treatment session limiting some activities but patient was able to complete many functional activities in standing promoting lower extremity strength and functional mobility improvements.Pt will continue to benefit from skilled physical therapy intervention to address impairments, improve QOL, and attain therapy goals.      OBJECTIVE IMPAIRMENTS: Abnormal gait, cardiopulmonary status limiting activity, decreased activity tolerance, decreased balance, decreased endurance, decreased mobility, difficulty walking, decreased ROM, decreased strength, decreased safety awareness, hypomobility, increased fascial restrictions, impaired flexibility, improper body mechanics, and postural dysfunction.   ACTIVITY LIMITATIONS: lifting, standing, squatting, transfers, and locomotion level  PARTICIPATION LIMITATIONS: laundry, shopping, and community activity  PERSONAL FACTORS: 3+ comorbidities: scoliosis, hx of CA, and PE  are also affecting patient's functional outcome.   REHAB POTENTIAL: Good  CLINICAL DECISION MAKING: Stable/uncomplicated  EVALUATION COMPLEXITY: Moderate  PLAN:  PT FREQUENCY: 1-2x/week  PT DURATION: 12 weeks  PLANNED INTERVENTIONS: Therapeutic exercises, Therapeutic activity, Neuromuscular re-education, Balance training, Gait training, Patient/Family education, Self Care, Joint mobilization, Stair training, Vestibular  training, DME instructions, Dry Needling, Spinal mobilization, Cryotherapy, Moist heat, and Manual therapy  PLAN FOR NEXT SESSION:   Dynamic balance training. BLE and postural strengthening.    Lonni KATHEE Gainer, PT 12/04/2023, 4:03 PM

## 2023-12-06 ENCOUNTER — Ambulatory Visit: Payer: Medicare Other | Admitting: Physical Therapy

## 2023-12-06 DIAGNOSIS — M6281 Muscle weakness (generalized): Secondary | ICD-10-CM

## 2023-12-06 DIAGNOSIS — R2681 Unsteadiness on feet: Secondary | ICD-10-CM

## 2023-12-06 DIAGNOSIS — M5459 Other low back pain: Secondary | ICD-10-CM

## 2023-12-06 DIAGNOSIS — R278 Other lack of coordination: Secondary | ICD-10-CM

## 2023-12-06 DIAGNOSIS — R262 Difficulty in walking, not elsewhere classified: Secondary | ICD-10-CM

## 2023-12-06 DIAGNOSIS — R2689 Other abnormalities of gait and mobility: Secondary | ICD-10-CM

## 2023-12-06 NOTE — Therapy (Signed)
 OUTPATIENT PHYSICAL THERAPY NEURO TREATMENT NOTE   Patient Name: Kristina Rowe MRN: [id number] DOB:06/22/1930, 89 y.o., female Today's Date: 12/06/2023   PCP: Sherial Bail, MD  REFERRING PROVIDER:   Suzzane Charleston, PA-C    END OF SESSION:  PT End of Session - 12/06/23 1400     Visit Number 18    Number of Visits 38    Date for PT Re-Evaluation 01/31/24    Progress Note Due on Visit 20    PT Start Time 1405    PT Stop Time 1445    PT Time Calculation (min) 40 min    Equipment Utilized During Treatment Gait belt    Activity Tolerance Patient tolerated treatment well;No increased pain    Behavior During Therapy WFL for tasks assessed/performed                   Past Medical History:  Diagnosis Date   Ductal carcinoma in situ (DCIS) of left breast    Dysphagia    Esophageal stricture    Gastroparesis    GERD (gastroesophageal reflux disease)    Hiatal hernia    Hypertension    Iron deficiency anemia    Osteoporosis    Pulmonary embolism (HCC)    Scoliosis    UTI (urinary tract infection)    Past Surgical History:  Procedure Laterality Date   BREAST LUMPECTOMY Left 2009   Ductal Carcinoma insitu    CATARACT EXTRACTION, BILATERAL     COLONOSCOPY N/A 03/17/2021   Procedure: COLONOSCOPY;  Surgeon: Maryruth Ole DASEN, MD;  Location: [facility];  Service: Endoscopy;  Laterality: N/A;   COLOSTOMY REVISION Right 03/18/2021   Procedure: COLON RESECTION RIGHT;  Surgeon: Desiderio Schanz, MD;  Location: [facility];  Service: General;  Laterality: Right;   LAPAROSCOPIC PARAESOPHAGEAL HERNIA REPAIR  2009   LAPAROTOMY N/A 03/10/2021   Procedure: EXPLORATORY LAPAROTOMY;  Surgeon: Desiderio Schanz, MD;  Location: [facility];  Service: General;  Laterality: N/A;   TUBAL LIGATION     Patient Active Problem List   Diagnosis Date Noted   Acute on chronic diastolic CHF (congestive heart failure) (HCC) 08/19/2023   Acute respiratory failure with hypoxia (HCC) 08/17/2023    History of pulmonary embolus (PE) 08/17/2023   Basal cell carcinoma of leg 04/01/2021   Coronary atherosclerosis 04/01/2021   History of pulmonary embolism 04/01/2021   Pulmonary nodule 04/01/2021   Malignant neoplasm of colon (HCC) 03/29/2021   Rectal bleeding 03/15/2021   Hypertension    GERD (gastroesophageal reflux disease)    Colonic mass    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HCC) 03/10/2021   Anemia, unspecified 03/06/2021   Bronchiectasis without complication (HCC) 03/27/2020   Iron deficiency anemia 03/27/2020   Moderate aortic stenosis 03/27/2020   Stage 3a chronic kidney disease (HCC) 07/04/2019   Hypnic headache 05/28/2019   Telogen effluvium 04/17/2019   Xerosis cutis 04/17/2019   Essential hypertension 01/14/2019   Cervical radiculopathy 05/04/2018   Osteoarthritis of left glenohumeral joint 05/04/2018   Vitamin B12 deficiency 01/01/2018   Vascular abnormality 09/11/2017   Dyspepsia 03/28/2017   Pulmonary embolism (HCC) 08/16/2016   Dilated pore of [place] 03/23/2015   Retinal drusen of both eyes 08/28/2014   Status post right cataract extraction 07/30/2014   Verruca 03/19/2014   Chronic back pain 11/06/2013   Postmenopausal osteoporosis 06/11/2013   Anticoagulated on Coumadin  04/06/2013   Long term (current) use of anticoagulants 10/18/2012   Preglaucoma 05/10/2012   Presbyopia 05/10/2012   Senile nuclear sclerosis 05/10/2012  Lobular carcinoma in situ of breast 04/13/2012   Closed fracture of ankle 02/27/2012   History of nonmelanoma skin cancer 09/05/2011   Other benign neoplasm of skin of trunk 09/05/2011   Osteoporosis 08/25/2011   Transient alteration of awareness 09/03/2009   Colon adenoma 07/30/2002    ONSET DATE: 3 months   REFERRING DIAG:  Diagnosis  R53.83 (ICD-10-CM) - Other fatigue    THERAPY DIAG:  Muscle weakness (generalized)  Difficulty in walking, not elsewhere classified  Unsteadiness on feet  Other abnormalities of gait and  mobility  Other low back pain  Other lack of coordination  Rationale for Evaluation and Treatment: Rehabilitation  SUBJECTIVE:                                                                                                                                                                                             SUBJECTIVE STATEMENT:  Pt reports she had a bad visit with her cardiologist ( she did not like him) and reports she did not like the recommended medication. She will wait and see if she can get more advice from Dr. Sherial when she sees him again.   Pt accompanied by: family member  PERTINENT HISTORY:  From recent MD visit.  89 y.o. here for an acute issue. She has a PMH of chronic PE/Coumadin , anemia, colon cancer, breast cancer, large hiatal hernia, moderate aortic stenosis who has concerns about shortness of breath, nausea, generalized weakness. No chest pain. History of hiatal hernia and recent CT chest. Also recent endoscopy. Takes Zofran  chronically. Has felt weaker and almost fell recently. Uses a walker. Also on chronic UTI suppression, Keflex .   PAIN:  Are you having pain? Yes: NPRS scale: 5/10 Pain location: low back  Pain description: sore  Aggravating factors: movement  Relieving factors: rest   PRECAUTIONS: Fall  RED FLAGS: None   WEIGHT BEARING RESTRICTIONS: No  FALLS: Has patient fallen in last 6 months? No  LIVING ENVIRONMENT: Lives with: lives alone Lives in: House/apartment Stairs: Yes: External: 1 steps; none Has following equipment at home: Walker - 4 wheeled  PLOF: Independent with household mobility with device and Independent with community mobility with device  PATIENT GOALS: improved strength in BLE   OBJECTIVE:   DIAGNOSTIC FINDINGS:  06/12/2023 EXAM: CT CHEST, ABDOMEN, AND PELVIS WITH CONTRAST IMPRESSION: 1. Status post right hemicolectomy, without recurrent or metastatic disease. Decreased sensitivity exam, primarily involving  the abdomen pelvis, due to paucity of fat. 2. New tiny left groin hernia containing fluid. Consider physical exam correlation. 3. Incidental findings, including: Coronary artery atherosclerosis. Aortic Atherosclerosis (ICD10-I70.0). Dilated esophagus with contrast  throughout, suggesting dysmotility and/or gastroesophageal reflux.  COGNITION: Overall cognitive status: Within functional limits for tasks assessed   SENSATION: Light touch: Impaired  mild paraesthesia in the proximal RLE   COORDINATION: WFL. Ankle to knee limited ROM due to weakness in the R hip      POSTURE: rounded shoulders, forward head, flexed trunk , and severe scoliosis     LOWER EXTREMITY MMT:    MMT Right Eval Left Eval  Hip flexion 4- 4  Hip extension    Hip abduction 4- 44  Hip adduction 4 4  Hip internal rotation    Hip external rotation    Knee flexion 4- 4  Knee extension 4 4+  Ankle dorsiflexion 4 4+  Ankle plantarflexion    Ankle inversion    Ankle eversion    (Blank rows = not tested)    TRANSFERS: Assistive device utilized: Environmental Consultant - 4 wheeled  Sit to stand: SBA Stand to sit: SBA Chair to chair: SBA Floor:  Unable to perform    STAIRS: Level of Assistance:  pt declines Stair Negotiation Technique: Step to Pattern with Bilateral Rails Number of Stairs: 4  Height of Stairs: 6  Comments: will need to assess.   GAIT: Gait pattern: step through pattern, Right foot flat, and lateral hip instability Distance walked: 267ft Assistive device utilized: Walker - 4 wheeled Level of assistance: Modified independence and SBA Comments: severe scoliosis.    FUNCTIONAL TESTS:  5 times sit to stand: 30.86 Timed up and go (TUG): 29.39 2 minute walk test: 241ft  10 meter walk test: 0.682m/s, SOB 4-5.  Berg Balance Scale: TBD  PATIENT SURVEYS:  FOTO 56. goal 64  TODAY'S TREATMENT:                                                                                                                               DATE:  12/06/23    TherAct Step over bolster forward and back with 1 LE x 10 bil; BUE supported on rail  Lateral step with 1 LE over bolster x 10 BUE supported Side stepping R and L x 4 laps no UE support Forward/reverse x 4 laps no UE support Partial squat x 10 no UE support  Hip extension x 8 bil  Weighted gait training with 2# AW 2 x 372ft   Cues for improved posture and step length in all directions as tolerated. Poor toe off when ambulating without UE support on rails or RW. Reports mild low back pain with hip extension    PATIENT EDUCATION: Education details: Pt educated throughout session about proper posture and technique with exercises. Improved exercise technique, movement at target joints, use of target muscles after min to mod verbal, visual, tactile cues.  Person educated: Patient Education method: Explanation Education comprehension: verbalized understanding and returned demonstration  HOME EXERCISE PROGRAM: Access Code: [email or web address] URL: [email or web address] Date: 09/25/2023 Prepared by: Massie Dollar  Exercises - Seated Hip Abduction with Resistance  -  1 x daily - 4 x weekly - 3 sets - 15 reps - 3 hold - Seated March with Resistance  - 1 x daily - 4 x weekly - 3 sets - 10 reps - 1 hold - Seated Long Arc Quad  - 1 x daily - 4 x weekly - 3 sets - 12 reps - 3 hold - Seated Hip Adduction Isometrics with Ball  - 1 x daily - 4 x weekly - 3 sets - 12 reps - 3 hold - Standing March with Counter Support  - 1 x daily - 3 x weekly - 2 sets - 10 reps - Standing Hip Abduction with Counter Support  - 1 x daily - 3 x weekly - 2 sets - 10 reps - Standing Hip Extension with Counter Support  - 1 x daily - 3 x weekly - 2 sets - 10 reps - Mini Squat with Counter Support  - 1 x daily - 3 x weekly - 2 sets - 10 reps - Standing Knee Flexion with Counter Support  - 1 x daily - 3 x weekly - 2 sets - 10 reps - Standing Tandem Balance with Counter Support   - 1 x daily - 3 x weekly - 2 sets - 4 reps - 10 hold  GOALS: Goals reviewed with patient? Yes   SHORT TERM GOALS: Target date: 12/28/23    Patient will be independent in home exercise program to improve strength/mobility for better functional independence with ADLs. Baseline:to be given at next session; 10/23/2023- Patient reports independence with HEP provided and no significant issues.   Goal status: MET 2. Pt will be able to ambulate for at least 5 min without stopping with LRAD to allow improved access to community and return to PLOF  Baseline: 3 minutes.   Goal Status: initial    LONG TERM GOALS: Target date: 01/31/2024    Patient will increase FOTO score to equal to or greater than  61   to demonstrate statistically significant improvement in mobility and quality of life.  Baseline: 56; 10/23/2023= 60 Goal status: PROGRESSING  2.  Patient (> 29 years old) will complete five times sit to stand test in < 15 seconds  without UE support indicating an increased LE strength and improved balance. Baseline: 30.86 sec; 10/23/2023= 22.08 using 1 UE support.  12/11: 14.67 sec BUE from arm rest. And 14.12 with 1 UE pushing from arm rest. Goal status: MET/ Adjusted   3.  Patient will increase Berg Balance score to >45 points to demonstrate decreased fall risk during functional activities  Baseline: 26; 10/23/2023= 30 12/11: 34 Goal status: MET/ Adjusted   4.  Patient will increase 10 meter walk test to >1.72m/s as to improve gait speed for better community ambulation and to reduce fall risk. Baseline: 0.635m/s; 10/23/2023= 0.88 m/s (normal speed with 4WW) 12/11: Average Fast speed: 1.05 m/s Average Normal speed: 0.834 m/s Goal status: PROGRESSING  5.  Patient will reduce timed up and go to <11 seconds to reduce fall risk and demonstrate improved transfer/gait ability. Baseline: 29.39 12/2: 16.4 sec with Rollator  12/11: 15.19 sec with rollator   Goal status: IN PROGRESS  6.   Patient will 2 min walk test by at least 34ft as to demonstrate reduced fall risk and improved dynamic gait balance for better safety with community/home ambulation.   Baseline: 265ft; 10/23/2023= 360 feet  with 4WW 341ft with 4WW Goal status: MET  7. Pt will complete 6 min walk test  without stopping and improve overall score by at least 138ft to indicate increased access to community.    BaseLine: to be complete  Goal: initial    ASSESSMENT: CLINICAL IMPRESSION:   Patient presents with good motivation for completion of physical therapy activities.  PT treatment focused on functional balance training with reduced UE support in parallel bars as well as resistive gait training to encourage improved step length and height. Noted to have improved ability to ambulate in various planes without UE support compared last session. Pt will continue to benefit from skilled physical therapy intervention to address impairments, improve QOL, and attain therapy goals.      OBJECTIVE IMPAIRMENTS: Abnormal gait, cardiopulmonary status limiting activity, decreased activity tolerance, decreased balance, decreased endurance, decreased mobility, difficulty walking, decreased ROM, decreased strength, decreased safety awareness, hypomobility, increased fascial restrictions, impaired flexibility, improper body mechanics, and postural dysfunction.   ACTIVITY LIMITATIONS: lifting, standing, squatting, transfers, and locomotion level  PARTICIPATION LIMITATIONS: laundry, shopping, and community activity  PERSONAL FACTORS: 3+ comorbidities: scoliosis, hx of CA, and PE  are also affecting patient's functional outcome.   REHAB POTENTIAL: Good  CLINICAL DECISION MAKING: Stable/uncomplicated  EVALUATION COMPLEXITY: Moderate  PLAN:  PT FREQUENCY: 1-2x/week  PT DURATION: 12 weeks  PLANNED INTERVENTIONS: Therapeutic exercises, Therapeutic activity, Neuromuscular re-education, Balance training, Gait training,  Patient/Family education, Self Care, Joint mobilization, Stair training, Vestibular training, DME instructions, Dry Needling, Spinal mobilization, Cryotherapy, Moist heat, and Manual therapy  PLAN FOR NEXT SESSION:   Continue Dynamic balance training. BLE and postural strengthening.    Massie FORBES Dollar, PT 12/06/2023, 2:09 PM

## 2023-12-13 ENCOUNTER — Ambulatory Visit: Payer: Medicare Other | Admitting: Physical Therapy

## 2023-12-19 ENCOUNTER — Ambulatory Visit: Payer: Medicare Other | Admitting: Physical Therapy

## 2023-12-19 NOTE — Therapy (Deleted)
OUTPATIENT PHYSICAL THERAPY NEURO TREATMENT NOTE   Patient Name: Kristina Rowe MRN: [id number] DOB:[birth year], 89 y.o., female Today's Date: 12/19/2023   PCP: Enid Baas, MD  REFERRING PROVIDER:   Ignacia Bayley, PA-C    END OF SESSION:          Past Medical History:  Diagnosis Date   Ductal carcinoma in situ (DCIS) of left breast    Dysphagia    Esophageal stricture    Gastroparesis    GERD (gastroesophageal reflux disease)    Hiatal hernia    Hypertension    Iron deficiency anemia    Osteoporosis    Pulmonary embolism (HCC)    Scoliosis    UTI (urinary tract infection)    Past Surgical History:  Procedure Laterality Date   BREAST LUMPECTOMY Left 2009   Ductal Carcinoma insitu    CATARACT EXTRACTION, BILATERAL     COLONOSCOPY N/A 03/17/2021   Procedure: COLONOSCOPY;  Surgeon: Regis Bill, MD;  Location: [facility];  Service: Endoscopy;  Laterality: N/A;   COLOSTOMY REVISION Right 03/18/2021   Procedure: COLON RESECTION RIGHT;  Surgeon: Henrene Dodge, MD;  Location: [facility];  Service: General;  Laterality: Right;   LAPAROSCOPIC PARAESOPHAGEAL HERNIA REPAIR  2009   LAPAROTOMY N/A 03/10/2021   Procedure: EXPLORATORY LAPAROTOMY;  Surgeon: Henrene Dodge, MD;  Location: [facility];  Service: General;  Laterality: N/A;   TUBAL LIGATION     Patient Active Problem List   Diagnosis Date Noted   Acute on chronic diastolic CHF (congestive heart failure) (HCC) 08/19/2023   Acute respiratory failure with hypoxia (HCC) 08/17/2023   History of pulmonary embolus (PE) 08/17/2023   Basal cell carcinoma of leg 04/01/2021   Coronary atherosclerosis 04/01/2021   History of pulmonary embolism 04/01/2021   Pulmonary nodule 04/01/2021   Malignant neoplasm of colon (HCC) 03/29/2021   Rectal bleeding 03/15/2021   Hypertension    GERD (gastroesophageal reflux disease)    Colonic mass    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HCC) 03/10/2021   Anemia, unspecified 03/06/2021    Bronchiectasis without complication (HCC) 03/27/2020   Iron deficiency anemia 03/27/2020   Moderate aortic stenosis 03/27/2020   Stage 3a chronic kidney disease (HCC) 07/04/2019   Hypnic headache 05/28/2019   Telogen effluvium 04/17/2019   Xerosis cutis 04/17/2019   Essential hypertension 01/14/2019   Cervical radiculopathy 05/04/2018   Osteoarthritis of left glenohumeral joint 05/04/2018   Vitamin B12 deficiency 01/01/2018   Vascular abnormality 09/11/2017   Dyspepsia 03/28/2017   Pulmonary embolism (HCC) 08/16/2016   Dilated pore of [place] 03/23/2015   Retinal drusen of both eyes 08/28/2014   Status post right cataract extraction 07/30/2014   Verruca 03/19/2014   Chronic back pain 11/06/2013   Postmenopausal osteoporosis 06/11/2013   Anticoagulated on Coumadin 04/06/2013   Long term (current) use of anticoagulants 10/18/2012   Preglaucoma 05/10/2012   Presbyopia 05/10/2012   Senile nuclear sclerosis 05/10/2012   Lobular carcinoma in situ of breast 04/13/2012   Closed fracture of ankle 02/27/2012   History of nonmelanoma skin cancer 09/05/2011   Other benign neoplasm of skin of trunk 09/05/2011   Osteoporosis 08/25/2011   Transient alteration of awareness 09/03/2009   Colon adenoma 07/30/2002    ONSET DATE: 3 months   REFERRING DIAG:  Diagnosis  R53.83 (ICD-10-CM) - Other fatigue    THERAPY DIAG:  Muscle weakness (generalized)  Difficulty in walking, not elsewhere classified  Unsteadiness on feet  Other abnormalities of gait and mobility  Rationale for Evaluation and  Treatment: Rehabilitation  SUBJECTIVE:                                                                                                                                                                                             SUBJECTIVE STATEMENT:  Pt reports she had a bad visit with her cardiologist ( she did not like him) and reports she did not like the recommended medication. She will wait  and see if she can get more advice from Dr. Nemiah Commander when she sees him again.   Pt accompanied by: family member  PERTINENT HISTORY:  From recent MD visit.  89 y.o. here for an acute issue. She has a PMH of chronic PE/Coumadin, anemia, colon cancer, breast cancer, large hiatal hernia, moderate aortic stenosis who has concerns about shortness of breath, nausea, generalized weakness. No chest pain. History of hiatal hernia and recent CT chest. Also recent endoscopy. Takes Zofran chronically. Has felt weaker and almost fell recently. Uses a walker. Also on chronic UTI suppression, Keflex.   PAIN:  Are you having pain? Yes: NPRS scale: 5/10 Pain location: low back  Pain description: sore  Aggravating factors: movement  Relieving factors: rest   PRECAUTIONS: Fall  RED FLAGS: None   WEIGHT BEARING RESTRICTIONS: No  FALLS: Has patient fallen in last 6 months? No  LIVING ENVIRONMENT: Lives with: lives alone Lives in: House/apartment Stairs: Yes: External: 1 steps; none Has following equipment at home: Walker - 4 wheeled  PLOF: Independent with household mobility with device and Independent with community mobility with device  PATIENT GOALS: improved strength in BLE   OBJECTIVE:   DIAGNOSTIC FINDINGS:  06/12/2023 EXAM: CT CHEST, ABDOMEN, AND PELVIS WITH CONTRAST IMPRESSION: 1. Status post right hemicolectomy, without recurrent or metastatic disease. Decreased sensitivity exam, primarily involving the abdomen pelvis, due to paucity of fat. 2. New tiny left groin hernia containing fluid. Consider physical exam correlation. 3. Incidental findings, including: Coronary artery atherosclerosis. Aortic Atherosclerosis (ICD10-I70.0). Dilated esophagus with contrast throughout, suggesting dysmotility and/or gastroesophageal reflux.  COGNITION: Overall cognitive status: Within functional limits for tasks assessed   SENSATION: Light touch: Impaired  mild paraesthesia in the proximal  RLE   COORDINATION: WFL. Ankle to knee limited ROM due to weakness in the R hip      POSTURE: rounded shoulders, forward head, flexed trunk , and severe scoliosis     LOWER EXTREMITY MMT:    MMT Right Eval Left Eval  Hip flexion 4- 4  Hip extension    Hip abduction 4- 44  Hip adduction 4 4  Hip internal rotation    Hip external rotation  Knee flexion 4- 4  Knee extension 4 4+  Ankle dorsiflexion 4 4+  Ankle plantarflexion    Ankle inversion    Ankle eversion    (Blank rows = not tested)    TRANSFERS: Assistive device utilized: Environmental consultant - 4 wheeled  Sit to stand: SBA Stand to sit: SBA Chair to chair: SBA Floor:  Unable to perform    STAIRS: Level of Assistance:  pt declines Stair Negotiation Technique: Step to Pattern with Bilateral Rails Number of Stairs: 4  Height of Stairs: 6  Comments: will need to assess.   GAIT: Gait pattern: step through pattern, Right foot flat, and lateral hip instability Distance walked: 257ft Assistive device utilized: Walker - 4 wheeled Level of assistance: Modified independence and SBA Comments: severe scoliosis.    FUNCTIONAL TESTS:  5 times sit to stand: 30.86 Timed up and go (TUG): 29.39 2 minute walk test: 239ft  10 meter walk test: 0.619m/s, SOB 4-5.  Berg Balance Scale: TBD  PATIENT SURVEYS:  FOTO 56. goal 64  TODAY'S TREATMENT:                                                                                                                              DATE:  12/19/23    TherAct Step over bolster forward and back with 1 LE x 10 bil; BUE supported on rail  Lateral step with 1 LE over bolster x 10 BUE supported Side stepping R and L x 4 laps no UE support Forward/reverse x 4 laps no UE support Partial squat x 10 no UE support  Hip extension x 8 bil  Weighted gait training with 2# AW 2 x 369ft   Cues for improved posture and step length in all directions as tolerated. Poor toe off when ambulating without UE  support on rails or RW. Reports mild low back pain with hip extension    PATIENT EDUCATION: Education details: Pt educated throughout session about proper posture and technique with exercises. Improved exercise technique, movement at target joints, use of target muscles after min to mod verbal, visual, tactile cues.  Person educated: Patient Education method: Explanation Education comprehension: verbalized understanding and returned demonstration  HOME EXERCISE PROGRAM: Access Code: [email or web address] URL: [email or web address] Date: 09/25/2023 Prepared by: Grier Rocher  Exercises - Seated Hip Abduction with Resistance  - 1 x daily - 4 x weekly - 3 sets - 15 reps - 3 hold - Seated March with Resistance  - 1 x daily - 4 x weekly - 3 sets - 10 reps - 1 hold - Seated Long Arc Quad  - 1 x daily - 4 x weekly - 3 sets - 12 reps - 3 hold - Seated Hip Adduction Isometrics with Ball  - 1 x daily - 4 x weekly - 3 sets - 12 reps - 3 hold - Standing March with Counter Support  - 1 x daily - 3 x weekly - 2 sets - 10 reps -  Standing Hip Abduction with Counter Support  - 1 x daily - 3 x weekly - 2 sets - 10 reps - Standing Hip Extension with Counter Support  - 1 x daily - 3 x weekly - 2 sets - 10 reps - Mini Squat with Counter Support  - 1 x daily - 3 x weekly - 2 sets - 10 reps - Standing Knee Flexion with Counter Support  - 1 x daily - 3 x weekly - 2 sets - 10 reps - Standing Tandem Balance with Counter Support  - 1 x daily - 3 x weekly - 2 sets - 4 reps - 10 hold  GOALS: Goals reviewed with patient? Yes   SHORT TERM GOALS: Target date: 12/28/23    Patient will be independent in home exercise program to improve strength/mobility for better functional independence with ADLs. Baseline:to be given at next session; 10/23/2023- Patient reports independence with HEP provided and no significant issues.   Goal status: MET 2. Pt will be able to ambulate for at least 5 min without stopping with  LRAD to allow improved access to community and return to PLOF  Baseline: 3 minutes.   Goal Status: initial    LONG TERM GOALS: Target date: 01/31/2024    Patient will increase FOTO score to equal to or greater than  61   to demonstrate statistically significant improvement in mobility and quality of life.  Baseline: 56; 10/23/2023= 60 Goal status: PROGRESSING  2.  Patient (> 88 years old) will complete five times sit to stand test in < 15 seconds  without UE support indicating an increased LE strength and improved balance. Baseline: 30.86 sec; 10/23/2023= 22.08 using 1 UE support.  12/11: 14.67 sec BUE from arm rest. And 14.12 with 1 UE pushing from arm rest. Goal status: MET/ Adjusted   3.  Patient will increase Berg Balance score to >45 points to demonstrate decreased fall risk during functional activities  Baseline: 26; 10/23/2023= 30 12/11: 34 Goal status: MET/ Adjusted   4.  Patient will increase 10 meter walk test to >1.3m/s as to improve gait speed for better community ambulation and to reduce fall risk. Baseline: 0.668m/s; 10/23/2023= 0.88 m/s (normal speed with 4WW) 12/11: Average Fast speed: 1.05 m/s Average Normal speed: 0.834 m/s Goal status: PROGRESSING  5.  Patient will reduce timed up and go to <11 seconds to reduce fall risk and demonstrate improved transfer/gait ability. Baseline: 29.39 12/2: 16.4 sec with Rollator  12/11: 15.19 sec with rollator   Goal status: IN PROGRESS  6.  Patient will 2 min walk test by at least 36ft as to demonstrate reduced fall risk and improved dynamic gait balance for better safety with community/home ambulation.   Baseline: 214ft; 10/23/2023= 360 feet  with 4WW 377ft with 4WW Goal status: MET  7. Pt will complete 6 min walk test without stopping and improve overall score by at least 170ft to indicate increased access to community.    BaseLine: to be complete  Goal: initial    ASSESSMENT: CLINICAL IMPRESSION:   Patient  presents with good motivation for completion of physical therapy activities.  PT treatment focused on functional balance training with reduced UE support in parallel bars as well as resistive gait training to encourage improved step length and height. Noted to have improved ability to ambulate in various planes without UE support compared last session. Pt will continue to benefit from skilled physical therapy intervention to address impairments, improve QOL, and attain therapy goals.  OBJECTIVE IMPAIRMENTS: Abnormal gait, cardiopulmonary status limiting activity, decreased activity tolerance, decreased balance, decreased endurance, decreased mobility, difficulty walking, decreased ROM, decreased strength, decreased safety awareness, hypomobility, increased fascial restrictions, impaired flexibility, improper body mechanics, and postural dysfunction.   ACTIVITY LIMITATIONS: lifting, standing, squatting, transfers, and locomotion level  PARTICIPATION LIMITATIONS: laundry, shopping, and community activity  PERSONAL FACTORS: 3+ comorbidities: scoliosis, hx of CA, and PE  are also affecting patient's functional outcome.   REHAB POTENTIAL: Good  CLINICAL DECISION MAKING: Stable/uncomplicated  EVALUATION COMPLEXITY: Moderate  PLAN:  PT FREQUENCY: 1-2x/week  PT DURATION: 12 weeks  PLANNED INTERVENTIONS: Therapeutic exercises, Therapeutic activity, Neuromuscular re-education, Balance training, Gait training, Patient/Family education, Self Care, Joint mobilization, Stair training, Vestibular training, DME instructions, Dry Needling, Spinal mobilization, Cryotherapy, Moist heat, and Manual therapy  PLAN FOR NEXT SESSION:   Continue Dynamic balance training. BLE and postural strengthening.    Norman Herrlich, PT 12/19/2023, 8:34 AM

## 2023-12-21 ENCOUNTER — Ambulatory Visit: Payer: Medicare Other

## 2023-12-25 ENCOUNTER — Ambulatory Visit
Admission: RE | Admit: 2023-12-25 | Discharge: 2023-12-25 | Disposition: A | Discharge status: Home / Self Care | Payer: Medicare Other | Source: Ambulatory Visit | Attending: Oncology | Admitting: Oncology

## 2023-12-25 DIAGNOSIS — D508 Other iron deficiency anemias: Secondary | ICD-10-CM

## 2023-12-25 DIAGNOSIS — Z08 Encounter for follow-up examination after completed treatment for malignant neoplasm: Secondary | ICD-10-CM

## 2023-12-26 ENCOUNTER — Ambulatory Visit: Payer: Medicare Other | Admitting: Physical Therapy

## 2023-12-26 DIAGNOSIS — M6281 Muscle weakness (generalized): Secondary | ICD-10-CM | POA: Diagnosis not present

## 2023-12-26 DIAGNOSIS — R278 Other lack of coordination: Secondary | ICD-10-CM

## 2023-12-26 DIAGNOSIS — R2689 Other abnormalities of gait and mobility: Secondary | ICD-10-CM

## 2023-12-26 DIAGNOSIS — M5459 Other low back pain: Secondary | ICD-10-CM

## 2023-12-26 DIAGNOSIS — R262 Difficulty in walking, not elsewhere classified: Secondary | ICD-10-CM

## 2023-12-26 DIAGNOSIS — R2681 Unsteadiness on feet: Secondary | ICD-10-CM

## 2023-12-26 NOTE — Therapy (Signed)
OUTPATIENT PHYSICAL THERAPY NEURO TREATMENT NOTE   Patient Name: Kristina Rowe MRN: [id number] DOB:03/29/1930, 89 y.o., female Today's Date: 12/26/2023   PCP: Enid Baas, MD  REFERRING PROVIDER:   Ignacia Bayley, PA-C    END OF SESSION:  PT End of Session - 12/26/23 1537     Visit Number 19    Number of Visits 38    Date for PT Re-Evaluation 01/31/24    Progress Note Due on Visit 20    PT Start Time 1535    PT Stop Time 1615    PT Time Calculation (min) 40 min    Equipment Utilized During Treatment Gait belt    Activity Tolerance Patient tolerated treatment well;No increased pain    Behavior During Therapy WFL for tasks assessed/performed                   Past Medical History:  Diagnosis Date   Ductal carcinoma in situ (DCIS) of left breast    Dysphagia    Esophageal stricture    Gastroparesis    GERD (gastroesophageal reflux disease)    Hiatal hernia    Hypertension    Iron deficiency anemia    Osteoporosis    Pulmonary embolism (HCC)    Scoliosis    UTI (urinary tract infection)    Past Surgical History:  Procedure Laterality Date   BREAST LUMPECTOMY Left 2009   Ductal Carcinoma insitu    CATARACT EXTRACTION, BILATERAL     COLONOSCOPY N/A 03/17/2021   Procedure: COLONOSCOPY;  Surgeon: Regis Bill, MD;  Location: [facility];  Service: Endoscopy;  Laterality: N/A;   COLOSTOMY REVISION Right 03/18/2021   Procedure: COLON RESECTION RIGHT;  Surgeon: Henrene Dodge, MD;  Location: [facility];  Service: General;  Laterality: Right;   LAPAROSCOPIC PARAESOPHAGEAL HERNIA REPAIR  2009   LAPAROTOMY N/A 03/10/2021   Procedure: EXPLORATORY LAPAROTOMY;  Surgeon: Henrene Dodge, MD;  Location: [facility];  Service: General;  Laterality: N/A;   TUBAL LIGATION     Patient Active Problem List   Diagnosis Date Noted   Acute on chronic diastolic CHF (congestive heart failure) (HCC) 08/19/2023   Acute respiratory failure with hypoxia (HCC) 08/17/2023    History of pulmonary embolus (PE) 08/17/2023   Basal cell carcinoma of leg 04/01/2021   Coronary atherosclerosis 04/01/2021   History of pulmonary embolism 04/01/2021   Pulmonary nodule 04/01/2021   Malignant neoplasm of colon (HCC) 03/29/2021   Rectal bleeding 03/15/2021   Hypertension    GERD (gastroesophageal reflux disease)    Colonic mass    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HCC) 03/10/2021   Anemia, unspecified 03/06/2021   Bronchiectasis without complication (HCC) 03/27/2020   Iron deficiency anemia 03/27/2020   Moderate aortic stenosis 03/27/2020   Stage 3a chronic kidney disease (HCC) 07/04/2019   Hypnic headache 05/28/2019   Telogen effluvium 04/17/2019   Xerosis cutis 04/17/2019   Essential hypertension 01/14/2019   Cervical radiculopathy 05/04/2018   Osteoarthritis of left glenohumeral joint 05/04/2018   Vitamin B12 deficiency 01/01/2018   Vascular abnormality 09/11/2017   Dyspepsia 03/28/2017   Pulmonary embolism (HCC) 08/16/2016   Dilated pore of [place] 03/23/2015   Retinal drusen of both eyes 08/28/2014   Status post right cataract extraction 07/30/2014   Verruca 03/19/2014   Chronic back pain 11/06/2013   Postmenopausal osteoporosis 06/11/2013   Anticoagulated on Coumadin 04/06/2013   Long term (current) use of anticoagulants 10/18/2012   Preglaucoma 05/10/2012   Presbyopia 05/10/2012   Senile nuclear sclerosis 05/10/2012  Lobular carcinoma in situ of breast 04/13/2012   Closed fracture of ankle 02/27/2012   History of nonmelanoma skin cancer 09/05/2011   Other benign neoplasm of skin of trunk 09/05/2011   Osteoporosis 08/25/2011   Transient alteration of awareness 09/03/2009   Colon adenoma 07/30/2002    ONSET DATE: 3 months   REFERRING DIAG:  Diagnosis  R53.83 (ICD-10-CM) - Other fatigue    THERAPY DIAG:  Muscle weakness (generalized)  Difficulty in walking, not elsewhere classified  Unsteadiness on feet  Other abnormalities of gait and  mobility  Other low back pain  Other lack of coordination  Rationale for Evaluation and Treatment: Rehabilitation  SUBJECTIVE:                                                                                                                                                                                             SUBJECTIVE STATEMENT:  Pt reports to PT following short hiatus from PT due to weather and transportation difficulties. CT was rescheduled for tomorrow for CA follow-up.  Reports feeling more fatigued with cold weather and inability to walk through neighborhood.   Pt accompanied by: self  PERTINENT HISTORY:  From recent MD visit.  89 y.o. here for an acute issue. She has a PMH of chronic PE/Coumadin, anemia, colon cancer, breast cancer, large hiatal hernia, moderate aortic stenosis who has concerns about shortness of breath, nausea, generalized weakness. No chest pain. History of hiatal hernia and recent CT chest. Also recent endoscopy. Takes Zofran chronically. Has felt weaker and almost fell recently. Uses a walker. Also on chronic UTI suppression, Keflex.   PAIN:  Are you having pain? Yes: NPRS scale: 5/10 Pain location: low back  Pain description: sore  Aggravating factors: movement  Relieving factors: rest   PRECAUTIONS: Fall  RED FLAGS: None   WEIGHT BEARING RESTRICTIONS: No  FALLS: Has patient fallen in last 6 months? No  LIVING ENVIRONMENT: Lives with: lives alone Lives in: House/apartment Stairs: Yes: External: 1 steps; none Has following equipment at home: Walker - 4 wheeled  PLOF: Independent with household mobility with device and Independent with community mobility with device  PATIENT GOALS: improved strength in BLE   OBJECTIVE:   DIAGNOSTIC FINDINGS:  06/12/2023 EXAM: CT CHEST, ABDOMEN, AND PELVIS WITH CONTRAST IMPRESSION: 1. Status post right hemicolectomy, without recurrent or metastatic disease. Decreased sensitivity exam, primarily  involving the abdomen pelvis, due to paucity of fat. 2. New tiny left groin hernia containing fluid. Consider physical exam correlation. 3. Incidental findings, including: Coronary artery atherosclerosis. Aortic Atherosclerosis (ICD10-I70.0). Dilated esophagus with contrast throughout, suggesting dysmotility and/or gastroesophageal reflux.  COGNITION:  Overall cognitive status: Within functional limits for tasks assessed   SENSATION: Light touch: Impaired  mild paraesthesia in the proximal RLE   COORDINATION: WFL. Ankle to knee limited ROM due to weakness in the R hip      POSTURE: rounded shoulders, forward head, flexed trunk , and severe scoliosis     LOWER EXTREMITY MMT:    MMT Right Eval Left Eval  Hip flexion 4- 4  Hip extension    Hip abduction 4- 44  Hip adduction 4 4  Hip internal rotation    Hip external rotation    Knee flexion 4- 4  Knee extension 4 4+  Ankle dorsiflexion 4 4+  Ankle plantarflexion    Ankle inversion    Ankle eversion    (Blank rows = not tested)    TRANSFERS: Assistive device utilized: Environmental consultant - 4 wheeled  Sit to stand: SBA Stand to sit: SBA Chair to chair: SBA Floor:  Unable to perform    STAIRS: Level of Assistance:  pt declines Stair Negotiation Technique: Step to Pattern with Bilateral Rails Number of Stairs: 4  Height of Stairs: 6  Comments: will need to assess.   GAIT: Gait pattern: step through pattern, Right foot flat, and lateral hip instability Distance walked: 216ft Assistive device utilized: Walker - 4 wheeled Level of assistance: Modified independence and SBA Comments: severe scoliosis.    FUNCTIONAL TESTS:  5 times sit to stand: 30.86 Timed up and go (TUG): 29.39 2 minute walk test: 225ft  10 meter walk test: 0.637m/s, SOB 4-5.  Berg Balance Scale: TBD  PATIENT SURVEYS:  FOTO 56. goal 64  TODAY'S TREATMENT:                                                                                                                               DATE:  12/26/23    Therex Step over bolster forward and back with 1 LE x8 bil; BUE Supported rollator, cues for posture when forward weight shift.  Sit<>stand x 10 with BUE support.  Standing on airex pad 4 x 30 sec bil  From airex pad: Foot tap on 6 inch step with UE supported  Step up/down 6 inch step with BUE support x 8+ 6 with standing rest break between bouts.  Gait through PT gym with rollator 2 x 46ft and through hall x 330ft. Pt rates SOB 6-7/10.   Cues for improved posture and step length as able. Reports mild low back pain following step ascent.    PATIENT EDUCATION: Education details: Pt educated throughout session about proper posture and technique with exercises. Improved exercise technique, movement at target joints, use of target muscles after min to mod verbal, visual, tactile cues.  Person educated: Patient Education method: Explanation Education comprehension: verbalized understanding and returned demonstration  HOME EXERCISE PROGRAM: Access Code: [email or web address] URL: [email or web address] Date: 09/25/2023 Prepared by: Grier Rocher  Exercises - Seated Hip Abduction with Resistance  - 1 x  daily - 4 x weekly - 3 sets - 15 reps - 3 hold - Seated March with Resistance  - 1 x daily - 4 x weekly - 3 sets - 10 reps - 1 hold - Seated Long Arc Quad  - 1 x daily - 4 x weekly - 3 sets - 12 reps - 3 hold - Seated Hip Adduction Isometrics with Ball  - 1 x daily - 4 x weekly - 3 sets - 12 reps - 3 hold - Standing March with Counter Support  - 1 x daily - 3 x weekly - 2 sets - 10 reps - Standing Hip Abduction with Counter Support  - 1 x daily - 3 x weekly - 2 sets - 10 reps - Standing Hip Extension with Counter Support  - 1 x daily - 3 x weekly - 2 sets - 10 reps - Mini Squat with Counter Support  - 1 x daily - 3 x weekly - 2 sets - 10 reps - Standing Knee Flexion with Counter Support  - 1 x daily - 3 x weekly - 2 sets - 10 reps -  Standing Tandem Balance with Counter Support  - 1 x daily - 3 x weekly - 2 sets - 4 reps - 10 hold  GOALS: Goals reviewed with patient? Yes   SHORT TERM GOALS: Target date: 12/28/23    Patient will be independent in home exercise program to improve strength/mobility for better functional independence with ADLs. Baseline:to be given at next session; 10/23/2023- Patient reports independence with HEP provided and no significant issues.   Goal status: MET 2. Pt will be able to ambulate for at least 5 min without stopping with LRAD to allow improved access to community and return to PLOF  Baseline: 3 minutes.   Goal Status: initial    LONG TERM GOALS: Target date: 01/31/2024    Patient will increase FOTO score to equal to or greater than  61   to demonstrate statistically significant improvement in mobility and quality of life.  Baseline: 56; 10/23/2023= 60 Goal status: PROGRESSING  2.  Patient (> 106 years old) will complete five times sit to stand test in < 15 seconds  without UE support indicating an increased LE strength and improved balance. Baseline: 30.86 sec; 10/23/2023= 22.08 using 1 UE support.  12/11: 14.67 sec BUE from arm rest. And 14.12 with 1 UE pushing from arm rest. Goal status: MET/ Adjusted   3.  Patient will increase Berg Balance score to >45 points to demonstrate decreased fall risk during functional activities  Baseline: 26; 10/23/2023= 30 12/11: 34 Goal status: MET/ Adjusted   4.  Patient will increase 10 meter walk test to >1.56m/s as to improve gait speed for better community ambulation and to reduce fall risk. Baseline: 0.666m/s; 10/23/2023= 0.88 m/s (normal speed with 4WW) 12/11: Average Fast speed: 1.05 m/s Average Normal speed: 0.834 m/s Goal status: PROGRESSING  5.  Patient will reduce timed up and go to <11 seconds to reduce fall risk and demonstrate improved transfer/gait ability. Baseline: 29.39 12/2: 16.4 sec with Rollator  12/11: 15.19 sec with  rollator   Goal status: IN PROGRESS  6.  Patient will 2 min walk test by at least 36ft as to demonstrate reduced fall risk and improved dynamic gait balance for better safety with community/home ambulation.   Baseline: 260ft; 10/23/2023= 360 feet  with 4WW 359ft with 4WW Goal status: MET  7. Pt will complete 6 min walk test without stopping  and improve overall score by at least 182ft to indicate increased access to community.    [place]: to be complete  Goal: initial    ASSESSMENT: CLINICAL IMPRESSION:   Patient presents with good motivation for completion of physical therapy activities. PT treatment focused on functional strengthen training and dynamic balance on level and unlevle surface. Pt reports that she has increased fatigue levels from not being as active over the last 3 weeks.  Pt will continue to benefit from skilled physical therapy intervention to address impairments, improve QOL, and attain therapy goals.      OBJECTIVE IMPAIRMENTS: Abnormal gait, cardiopulmonary status limiting activity, decreased activity tolerance, decreased balance, decreased endurance, decreased mobility, difficulty walking, decreased ROM, decreased strength, decreased safety awareness, hypomobility, increased fascial restrictions, impaired flexibility, improper body mechanics, and postural dysfunction.   ACTIVITY LIMITATIONS: lifting, standing, squatting, transfers, and locomotion level  PARTICIPATION LIMITATIONS: laundry, shopping, and community activity  PERSONAL FACTORS: 3+ comorbidities: scoliosis, hx of CA, and PE  are also affecting patient's functional outcome.   REHAB POTENTIAL: Good  CLINICAL DECISION MAKING: Stable/uncomplicated  EVALUATION COMPLEXITY: Moderate  PLAN:  PT FREQUENCY: 1-2x/week  PT DURATION: 12 weeks  PLANNED INTERVENTIONS: Therapeutic exercises, Therapeutic activity, Neuromuscular re-education, Balance training, Gait training, Patient/Family education, Self  Care, Joint mobilization, Stair training, Vestibular training, DME instructions, Dry Needling, Spinal mobilization, Cryotherapy, Moist heat, and Manual therapy  PLAN FOR NEXT SESSION:   Continue Dynamic balance training. BLE and postural strengthening.    Golden Pop, PT 12/26/2023, 3:47 PM

## 2023-12-27 ENCOUNTER — Ambulatory Visit
Admission: RE | Admit: 2023-12-27 | Discharge: 2023-12-27 | Disposition: A | Discharge status: Home / Self Care | Payer: Medicare Other | Source: Ambulatory Visit | Attending: Oncology | Admitting: Oncology

## 2023-12-27 DIAGNOSIS — D508 Other iron deficiency anemias: Secondary | ICD-10-CM | POA: Insufficient documentation

## 2023-12-27 DIAGNOSIS — Z85038 Personal history of other malignant neoplasm of large intestine: Secondary | ICD-10-CM | POA: Insufficient documentation

## 2023-12-27 DIAGNOSIS — Z08 Encounter for follow-up examination after completed treatment for malignant neoplasm: Secondary | ICD-10-CM | POA: Diagnosis present

## 2023-12-27 MED ORDER — BARIUM SULFATE 2 % PO SUSP
450.0000 mL | ORAL | Status: AC
  Administered 2023-12-27 (×2): 450 mL via ORAL

## 2023-12-27 MED ORDER — IOHEXOL 300 MG/ML  SOLN
75.0000 mL | Freq: Once | INTRAMUSCULAR | Status: AC | PRN
  Administered 2023-12-27: 75 mL via INTRAVENOUS

## 2023-12-27 NOTE — Therapy (Incomplete)
OUTPATIENT PHYSICAL THERAPY NEURO TREATMENT NOTE/ Physical Therapy Progress Note   Dates of reporting period  10/23/23   to   12/27/23     Patient Name: Kristina Rowe MRN: [id number] DOB:1930/08/12, 89 y.o., female Today's Date: 12/27/2023   PCP: Enid Baas, MD  REFERRING PROVIDER:   Ignacia Bayley, PA-C    END OF SESSION:          Past Medical History:  Diagnosis Date   Ductal carcinoma in situ (DCIS) of left breast    Dysphagia    Esophageal stricture    Gastroparesis    GERD (gastroesophageal reflux disease)    Hiatal hernia    Hypertension    Iron deficiency anemia    Osteoporosis    Pulmonary embolism (HCC)    Scoliosis    UTI (urinary tract infection)    Past Surgical History:  Procedure Laterality Date   BREAST LUMPECTOMY Left 2009   Ductal Carcinoma insitu    CATARACT EXTRACTION, BILATERAL     COLONOSCOPY N/A 03/17/2021   Procedure: COLONOSCOPY;  Surgeon: Regis Bill, MD;  Location: [facility];  Service: Endoscopy;  Laterality: N/A;   COLOSTOMY REVISION Right 03/18/2021   Procedure: COLON RESECTION RIGHT;  Surgeon: Henrene Dodge, MD;  Location: [facility];  Service: General;  Laterality: Right;   LAPAROSCOPIC PARAESOPHAGEAL HERNIA REPAIR  2009   LAPAROTOMY N/A 03/10/2021   Procedure: EXPLORATORY LAPAROTOMY;  Surgeon: Henrene Dodge, MD;  Location: [facility];  Service: General;  Laterality: N/A;   TUBAL LIGATION     Patient Active Problem List   Diagnosis Date Noted   Acute on chronic diastolic CHF (congestive heart failure) (HCC) 08/19/2023   Acute respiratory failure with hypoxia (HCC) 08/17/2023   History of pulmonary embolus (PE) 08/17/2023   Basal cell carcinoma of leg 04/01/2021   Coronary atherosclerosis 04/01/2021   History of pulmonary embolism 04/01/2021   Pulmonary nodule 04/01/2021   Malignant neoplasm of colon (HCC) 03/29/2021   Rectal bleeding 03/15/2021   Hypertension    GERD (gastroesophageal reflux disease)     Colonic mass    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HCC) 03/10/2021   Anemia, unspecified 03/06/2021   Bronchiectasis without complication (HCC) 03/27/2020   Iron deficiency anemia 03/27/2020   Moderate aortic stenosis 03/27/2020   Stage 3a chronic kidney disease (HCC) 07/04/2019   Hypnic headache 05/28/2019   Telogen effluvium 04/17/2019   Xerosis cutis 04/17/2019   Essential hypertension 01/14/2019   Cervical radiculopathy 05/04/2018   Osteoarthritis of left glenohumeral joint 05/04/2018   Vitamin B12 deficiency 01/01/2018   Vascular abnormality 09/11/2017   Dyspepsia 03/28/2017   Pulmonary embolism (HCC) 08/16/2016   Dilated pore of [place] 03/23/2015   Retinal drusen of both eyes 08/28/2014   Status post right cataract extraction 07/30/2014   Verruca 03/19/2014   Chronic back pain 11/06/2013   Postmenopausal osteoporosis 06/11/2013   Anticoagulated on Coumadin 04/06/2013   Long term (current) use of anticoagulants 10/18/2012   Preglaucoma 05/10/2012   Presbyopia 05/10/2012   Senile nuclear sclerosis 05/10/2012   Lobular carcinoma in situ of breast 04/13/2012   Closed fracture of ankle 02/27/2012   History of nonmelanoma skin cancer 09/05/2011   Other benign neoplasm of skin of trunk 09/05/2011   Osteoporosis 08/25/2011   Transient alteration of awareness 09/03/2009   Colon adenoma 07/30/2002    ONSET DATE: 3 months   REFERRING DIAG:  Diagnosis  R53.83 (ICD-10-CM) - Other fatigue    THERAPY DIAG:  No diagnosis found.  Rationale for  Evaluation and Treatment: Rehabilitation  SUBJECTIVE:                                                                                                                                                                                             SUBJECTIVE STATEMENT:  *** Pt accompanied by: self  PERTINENT HISTORY:  From recent MD visit.  89 y.o. here for an acute issue. She has a PMH of chronic PE/Coumadin, anemia, colon cancer, breast cancer,  large hiatal hernia, moderate aortic stenosis who has concerns about shortness of breath, nausea, generalized weakness. No chest pain. History of hiatal hernia and recent CT chest. Also recent endoscopy. Takes Zofran chronically. Has felt weaker and almost fell recently. Uses a walker. Also on chronic UTI suppression, Keflex.   PAIN:  Are you having pain? Yes: NPRS scale: 5/10 Pain location: low back  Pain description: sore  Aggravating factors: movement  Relieving factors: rest   PRECAUTIONS: Fall  RED FLAGS: None   WEIGHT BEARING RESTRICTIONS: No  FALLS: Has patient fallen in last 6 months? No  LIVING ENVIRONMENT: Lives with: lives alone Lives in: House/apartment Stairs: Yes: External: 1 steps; none Has following equipment at home: Walker - 4 wheeled  PLOF: Independent with household mobility with device and Independent with community mobility with device  PATIENT GOALS: improved strength in BLE   OBJECTIVE:   DIAGNOSTIC FINDINGS:  06/12/2023 EXAM: CT CHEST, ABDOMEN, AND PELVIS WITH CONTRAST IMPRESSION: 1. Status post right hemicolectomy, without recurrent or metastatic disease. Decreased sensitivity exam, primarily involving the abdomen pelvis, due to paucity of fat. 2. New tiny left groin hernia containing fluid. Consider physical exam correlation. 3. Incidental findings, including: Coronary artery atherosclerosis. Aortic Atherosclerosis (ICD10-I70.0). Dilated esophagus with contrast throughout, suggesting dysmotility and/or gastroesophageal reflux.  COGNITION: Overall cognitive status: Within functional limits for tasks assessed   SENSATION: Light touch: Impaired  mild paraesthesia in the proximal RLE   COORDINATION: WFL. Ankle to knee limited ROM due to weakness in the R hip      POSTURE: rounded shoulders, forward head, flexed trunk , and severe scoliosis     LOWER EXTREMITY MMT:    MMT Right Eval Left Eval  Hip flexion 4- 4  Hip extension     Hip abduction 4- 44  Hip adduction 4 4  Hip internal rotation    Hip external rotation    Knee flexion 4- 4  Knee extension 4 4+  Ankle dorsiflexion 4 4+  Ankle plantarflexion    Ankle inversion    Ankle eversion    (Blank rows = not tested)    TRANSFERS: Assistive device utilized: Environmental consultant -  4 wheeled  Sit to stand: SBA Stand to sit: SBA Chair to chair: SBA Floor:  Unable to perform    STAIRS: Level of Assistance:  pt declines Stair Negotiation Technique: Step to Pattern with Bilateral Rails Number of Stairs: 4  Height of Stairs: 6  Comments: will need to assess.   GAIT: Gait pattern: step through pattern, Right foot flat, and lateral hip instability Distance walked: 255ft Assistive device utilized: Walker - 4 wheeled Level of assistance: Modified independence and SBA Comments: severe scoliosis.    FUNCTIONAL TESTS:  5 times sit to stand: 30.86 Timed up and go (TUG): 29.39 2 minute walk test: 277ft  10 meter walk test: 0.655m/s, SOB 4-5.  Berg Balance Scale: TBD  PATIENT SURVEYS:  FOTO 56. goal 64  TODAY'S TREATMENT:                                                                                                                              DATE:  12/27/23  Physical therapy treatment session today consisted of completing assessment of goals and administration of testing as demonstrated and documented in flow sheet, treatment, and goals section of this note. Addition treatments may be found below.    Therex Step over bolster forward and back with 1 LE x8 bil; BUE Supported rollator, cues for posture when forward weight shift.  Sit<>stand x 10 with BUE support.  Standing on airex pad 4 x 30 sec bil  From airex pad: Foot tap on 6 inch step with UE supported  Step up/down 6 inch step with BUE support x 8+ 6 with standing rest break between bouts.  Gait through PT gym with rollator 2 x 69ft and through hall x 381ft. Pt rates SOB 6-7/10.   Cues for improved  posture and step length as able. Reports mild low back pain following step ascent.    PATIENT EDUCATION: Education details: Pt educated throughout session about proper posture and technique with exercises. Improved exercise technique, movement at target joints, use of target muscles after min to mod verbal, visual, tactile cues.  Person educated: Patient Education method: Explanation Education comprehension: verbalized understanding and returned demonstration  HOME EXERCISE PROGRAM: Access Code: [email or web address] URL: [email or web address] Date: 09/25/2023 Prepared by: Grier Rocher  Exercises - Seated Hip Abduction with Resistance  - 1 x daily - 4 x weekly - 3 sets - 15 reps - 3 hold - Seated March with Resistance  - 1 x daily - 4 x weekly - 3 sets - 10 reps - 1 hold - Seated Long Arc Quad  - 1 x daily - 4 x weekly - 3 sets - 12 reps - 3 hold - Seated Hip Adduction Isometrics with Ball  - 1 x daily - 4 x weekly - 3 sets - 12 reps - 3 hold - Standing March with Counter Support  - 1 x daily - 3 x weekly - 2 sets - 10 reps - Standing Hip Abduction  with Counter Support  - 1 x daily - 3 x weekly - 2 sets - 10 reps - Standing Hip Extension with Counter Support  - 1 x daily - 3 x weekly - 2 sets - 10 reps - Mini Squat with Counter Support  - 1 x daily - 3 x weekly - 2 sets - 10 reps - Standing Knee Flexion with Counter Support  - 1 x daily - 3 x weekly - 2 sets - 10 reps - Standing Tandem Balance with Counter Support  - 1 x daily - 3 x weekly - 2 sets - 4 reps - 10 hold  GOALS: Goals reviewed with patient? Yes   SHORT TERM GOALS: Target date: 12/28/23    Patient will be independent in home exercise program to improve strength/mobility for better functional independence with ADLs. Baseline:to be given at next session; 10/23/2023- Patient reports independence with HEP provided and no significant issues.   Goal status: MET 2. Pt will be able to ambulate for at least 5 min without  stopping with LRAD to allow improved access to community and return to PLOF  Baseline: 3 minutes.   Goal Status: initial    LONG TERM GOALS: Target date: 01/31/2024    Patient will increase FOTO score to equal to or greater than  61   to demonstrate statistically significant improvement in mobility and quality of life.  Baseline: 56; 10/23/2023= 60 Goal status: PROGRESSING  2.  Patient (> 79 years old) will complete five times sit to stand test in < 15 seconds  without UE support indicating an increased LE strength and improved balance. Baseline: 30.86 sec; 10/23/2023= 22.08 using 1 UE support.  12/11: 14.67 sec BUE from arm rest. And 14.12 with 1 UE pushing from arm rest. Goal status: MET/ Adjusted   3.  Patient will increase Berg Balance score to >45 points to demonstrate decreased fall risk during functional activities  Baseline: 26; 10/23/2023= 30 12/11: 34 Goal status: MET/ Adjusted   4.  Patient will increase 10 meter walk test to >1.15m/s as to improve gait speed for better community ambulation and to reduce fall risk. Baseline: 0.643m/s; 10/23/2023= 0.88 m/s (normal speed with 4WW) 12/11: Average Fast speed: 1.05 m/s Average Normal speed: 0.834 m/s Goal status: PROGRESSING  5.  Patient will reduce timed up and go to <11 seconds to reduce fall risk and demonstrate improved transfer/gait ability. Baseline: 29.39 12/2: 16.4 sec with Rollator  12/11: 15.19 sec with rollator   Goal status: IN PROGRESS  6.  Patient will 2 min walk test by at least 72ft as to demonstrate reduced fall risk and improved dynamic gait balance for better safety with community/home ambulation.   Baseline: 271ft; 10/23/2023= 360 feet  with 4WW 343ft with 4WW Goal status: MET  7. Pt will complete 6 min walk test without stopping and improve overall score by at least 176ft to indicate increased access to community.    BaseLine: to be complete  Goal: initial    ASSESSMENT: CLINICAL IMPRESSION:    Patient's condition has the potential to improve in response to therapy. Maximum improvement is yet to be obtained. The anticipated improvement is attainable and reasonable in a generally predictable time.  *** Pt will continue to benefit from skilled physical therapy intervention to address impairments, improve QOL, and attain therapy goals.      OBJECTIVE IMPAIRMENTS: Abnormal gait, cardiopulmonary status limiting activity, decreased activity tolerance, decreased balance, decreased endurance, decreased mobility, difficulty walking, decreased ROM, decreased  strength, decreased safety awareness, hypomobility, increased fascial restrictions, impaired flexibility, improper body mechanics, and postural dysfunction.   ACTIVITY LIMITATIONS: lifting, standing, squatting, transfers, and locomotion level  PARTICIPATION LIMITATIONS: laundry, shopping, and community activity  PERSONAL FACTORS: 3+ comorbidities: scoliosis, hx of CA, and PE  are also affecting patient's functional outcome.   REHAB POTENTIAL: Good  CLINICAL DECISION MAKING: Stable/uncomplicated  EVALUATION COMPLEXITY: Moderate  PLAN:  PT FREQUENCY: 1-2x/week  PT DURATION: 12 weeks  PLANNED INTERVENTIONS: Therapeutic exercises, Therapeutic activity, Neuromuscular re-education, Balance training, Gait training, Patient/Family education, Self Care, Joint mobilization, Stair training, Vestibular training, DME instructions, Dry Needling, Spinal mobilization, Cryotherapy, Moist heat, and Manual therapy  PLAN FOR NEXT SESSION:   Continue Dynamic balance training. BLE and postural strengthening.    Precious Bard, PT 12/27/2023, 5:09 PM

## 2023-12-28 ENCOUNTER — Ambulatory Visit: Payer: Medicare Other

## 2024-01-01 ENCOUNTER — Ambulatory Visit: Payer: Medicare Other

## 2024-01-02 ENCOUNTER — Ambulatory Visit: Payer: Medicare Other | Attending: Physician Assistant | Admitting: Physical Therapy

## 2024-01-02 DIAGNOSIS — R262 Difficulty in walking, not elsewhere classified: Secondary | ICD-10-CM | POA: Insufficient documentation

## 2024-01-02 DIAGNOSIS — M6281 Muscle weakness (generalized): Secondary | ICD-10-CM | POA: Insufficient documentation

## 2024-01-02 DIAGNOSIS — R2681 Unsteadiness on feet: Secondary | ICD-10-CM | POA: Diagnosis present

## 2024-01-02 DIAGNOSIS — R2689 Other abnormalities of gait and mobility: Secondary | ICD-10-CM | POA: Insufficient documentation

## 2024-01-02 NOTE — Therapy (Signed)
 OUTPATIENT PHYSICAL THERAPY NEURO TREATMENT NOTE/Physical Therapy Progress Note   Dates of reporting period  10/30/23   to   01/02/24    Patient Name: Kristina Rowe MRN: [id number] DOB:[birth year], 89 y.o., female Today's Date: 01/02/2024   PCP: Sherial Bail, MD  REFERRING PROVIDER:   Suzzane Charleston, PA-C    END OF SESSION:  PT End of Session - 01/02/24 1315     Visit Number 20    Number of Visits 38    Date for PT Re-Evaluation 01/31/24    Progress Note Due on Visit 20    PT Start Time 1316    PT Stop Time 1356    PT Time Calculation (min) 40 min    Equipment Utilized During Treatment Gait belt    Activity Tolerance Patient tolerated treatment well;No increased pain    Behavior During Therapy WFL for tasks assessed/performed                   Past Medical History:  Diagnosis Date   Ductal carcinoma in situ (DCIS) of left breast    Dysphagia    Esophageal stricture    Gastroparesis    GERD (gastroesophageal reflux disease)    Hiatal hernia    Hypertension    Iron deficiency anemia    Osteoporosis    Pulmonary embolism (HCC)    Scoliosis    UTI (urinary tract infection)    Past Surgical History:  Procedure Laterality Date   BREAST LUMPECTOMY Left 2009   Ductal Carcinoma insitu    CATARACT EXTRACTION, BILATERAL     COLONOSCOPY N/A 03/17/2021   Procedure: COLONOSCOPY;  Surgeon: Maryruth Ole DASEN, MD;  Location: [facility];  Service: Endoscopy;  Laterality: N/A;   COLOSTOMY REVISION Right 03/18/2021   Procedure: COLON RESECTION RIGHT;  Surgeon: Desiderio Schanz, MD;  Location: [facility];  Service: General;  Laterality: Right;   LAPAROSCOPIC PARAESOPHAGEAL HERNIA REPAIR  2009   LAPAROTOMY N/A 03/10/2021   Procedure: EXPLORATORY LAPAROTOMY;  Surgeon: Desiderio Schanz, MD;  Location: [facility];  Service: General;  Laterality: N/A;   TUBAL LIGATION     Patient Active Problem List   Diagnosis Date Noted   Acute on chronic diastolic CHF (congestive heart  failure) (HCC) 08/19/2023   Acute respiratory failure with hypoxia (HCC) 08/17/2023   History of pulmonary embolus (PE) 08/17/2023   Basal cell carcinoma of leg 04/01/2021   Coronary atherosclerosis 04/01/2021   History of pulmonary embolism 04/01/2021   Pulmonary nodule 04/01/2021   Malignant neoplasm of colon (HCC) 03/29/2021   Rectal bleeding 03/15/2021   Hypertension    GERD (gastroesophageal reflux disease)    Colonic mass    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HCC) 03/10/2021   Anemia, unspecified 03/06/2021   Bronchiectasis without complication (HCC) 03/27/2020   Iron deficiency anemia 03/27/2020   Moderate aortic stenosis 03/27/2020   Stage 3a chronic kidney disease (HCC) 07/04/2019   Hypnic headache 05/28/2019   Telogen effluvium 04/17/2019   Xerosis cutis 04/17/2019   Essential hypertension 01/14/2019   Cervical radiculopathy 05/04/2018   Osteoarthritis of left glenohumeral joint 05/04/2018   Vitamin B12 deficiency 01/01/2018   Vascular abnormality 09/11/2017   Dyspepsia 03/28/2017   Pulmonary embolism (HCC) 08/16/2016   Dilated pore of [place] 03/23/2015   Retinal drusen of both eyes 08/28/2014   Status post right cataract extraction 07/30/2014   Verruca 03/19/2014   Chronic back pain 11/06/2013   Postmenopausal osteoporosis 06/11/2013   Anticoagulated on Coumadin  04/06/2013   Long term (current)  use of anticoagulants 10/18/2012   Preglaucoma 05/10/2012   Presbyopia 05/10/2012   Senile nuclear sclerosis 05/10/2012   Lobular carcinoma in situ of breast 04/13/2012   Closed fracture of ankle 02/27/2012   History of nonmelanoma skin cancer 09/05/2011   Other benign neoplasm of skin of trunk 09/05/2011   Osteoporosis 08/25/2011   Transient alteration of awareness 09/03/2009   Colon adenoma 07/30/2002    ONSET DATE: 3 months   REFERRING DIAG:  Diagnosis  R53.83 (ICD-10-CM) - Other fatigue    THERAPY DIAG:  Muscle weakness (generalized)  Difficulty in walking, not  elsewhere classified  Unsteadiness on feet  Other abnormalities of gait and mobility  Rationale for Evaluation and Treatment: Rehabilitation  SUBJECTIVE:                                                                                                                                                                                             SUBJECTIVE STATEMENT:  Pt reports doing well today. Pt denies any recent falls/stumbles since prior session. Pt denies any updates to medications or medical appointment since prior session. Pt reports good compliance with HEP when time permits.   Pt accompanied by: self  PERTINENT HISTORY:  From recent MD visit.  89 y.o. here for an acute issue. She has a PMH of chronic PE/Coumadin , anemia, colon cancer, breast cancer, large hiatal hernia, moderate aortic stenosis who has concerns about shortness of breath, nausea, generalized weakness. No chest pain. History of hiatal hernia and recent CT chest. Also recent endoscopy. Takes Zofran  chronically. Has felt weaker and almost fell recently. Uses a walker. Also on chronic UTI suppression, Keflex .   PAIN:  Are you having pain? Yes: NPRS scale: 5/10 Pain location: low back  Pain description: sore  Aggravating factors: movement  Relieving factors: rest   PRECAUTIONS: Fall  RED FLAGS: None   WEIGHT BEARING RESTRICTIONS: No  FALLS: Has patient fallen in last 6 months? No  LIVING ENVIRONMENT: Lives with: lives alone Lives in: House/apartment Stairs: Yes: External: 1 steps; none Has following equipment at home: Walker - 4 wheeled  PLOF: Independent with household mobility with device and Independent with community mobility with device  PATIENT GOALS: improved strength in BLE   OBJECTIVE:   DIAGNOSTIC FINDINGS:  06/12/2023 EXAM: CT CHEST, ABDOMEN, AND PELVIS WITH CONTRAST IMPRESSION: 1. Status post right hemicolectomy, without recurrent or metastatic disease. Decreased sensitivity exam,  primarily involving the abdomen pelvis, due to paucity of fat. 2. New tiny left groin hernia containing fluid. Consider physical exam correlation. 3. Incidental findings, including: Coronary artery atherosclerosis. Aortic Atherosclerosis (ICD10-I70.0). Dilated esophagus with contrast throughout,  suggesting dysmotility and/or gastroesophageal reflux.  COGNITION: Overall cognitive status: Within functional limits for tasks assessed   SENSATION: Light touch: Impaired  mild paraesthesia in the proximal RLE   COORDINATION: WFL. Ankle to knee limited ROM due to weakness in the R hip      POSTURE: rounded shoulders, forward head, flexed trunk , and severe scoliosis     LOWER EXTREMITY MMT:    MMT Right Eval Left Eval  Hip flexion 4- 4  Hip extension    Hip abduction 4- 44  Hip adduction 4 4  Hip internal rotation    Hip external rotation    Knee flexion 4- 4  Knee extension 4 4+  Ankle dorsiflexion 4 4+  Ankle plantarflexion    Ankle inversion    Ankle eversion    (Blank rows = not tested)    TRANSFERS: Assistive device utilized: Environmental Consultant - 4 wheeled  Sit to stand: SBA Stand to sit: SBA Chair to chair: SBA Floor:  Unable to perform    STAIRS: Level of Assistance:  pt declines Stair Negotiation Technique: Step to Pattern with Bilateral Rails Number of Stairs: 4  Height of Stairs: 6  Comments: will need to assess.   GAIT: Gait pattern: step through pattern, Right foot flat, and lateral hip instability Distance walked: 22ft Assistive device utilized: Walker - 4 wheeled Level of assistance: Modified independence and SBA Comments: severe scoliosis.    FUNCTIONAL TESTS:  5 times sit to stand: 30.86 Timed up and go (TUG): 29.39 2 minute walk test: 214ft  10 meter walk test: 0.655m/s, SOB 4-5.  Berg Balance Scale: TBD  PATIENT SURVEYS:  FOTO 56. goal 64  TODAY'S TREATMENT:                                                                                                                               DATE:  01/02/24   Physical therapy treatment session today consisted of completing assessment of goals and administration of testing as demonstrated and documented in flow sheet, treatment, and goals section of this note. Addition treatments may be found below.    [facility] PT Assessment - 01/02/24 0001       Berg Balance Test   Sit to Stand Able to stand  independently using hands    Standing Unsupported Able to stand 2 minutes with supervision    Sitting with Back Unsupported but Feet Supported on Floor or Stool Able to sit safely and securely 2 minutes    Stand to Sit Controls descent by using hands    Transfers Able to transfer safely, definite need of hands    Standing Unsupported with Eyes Closed Able to stand 10 seconds safely    Standing Unsupported with Feet Together Able to place feet together independently and stand for 1 minute with supervision    From Standing, Reach Forward with Outstretched Arm Can reach forward >5 cm safely (2)    From Standing Position,  Pick up Object from Floor Able to pick up shoe, needs supervision    From Standing Position, Turn to Look Behind Over each Shoulder Turn sideways only but maintains balance    Turn 360 Degrees Able to turn 360 degrees safely but slowly    Standing Unsupported, Alternately Place Feet on Step/Stool Able to stand independently and complete 8 steps >20 seconds    Standing Unsupported, One Foot in Front Needs help to step but can hold 15 seconds    Standing on One Leg Tries to lift leg/unable to hold 3 seconds but remains standing independently    Total Score 37              TA- To improve functional movements patterns for everyday tasks  Sit<>stand x 10 with BUE support.  Ambulation with rollator x 320 ft  Standing march x 10 ea LE   NMR: To facilitate reeducation of movement, balance, posture, coordination, and/or proprioception/kinesthetic sense. No UE assist  Step tap to 6  in step 2 x 5 ea LE with standing rest between steps  Airex stance 3 x 30 sec  Cues for improved posture and step length as able. Reports mild low back pain following step ascent.    PATIENT EDUCATION: Education details: Pt educated throughout session about proper posture and technique with exercises. Improved exercise technique, movement at target joints, use of target muscles after min to mod verbal, visual, tactile cues.  Person educated: Patient Education method: Explanation Education comprehension: verbalized understanding and returned demonstration  HOME EXERCISE PROGRAM: Access Code: [email or web address] URL: [email or web address] Date: 09/25/2023 Prepared by: Massie Dollar  Exercises - Seated Hip Abduction with Resistance  - 1 x daily - 4 x weekly - 3 sets - 15 reps - 3 hold - Seated March with Resistance  - 1 x daily - 4 x weekly - 3 sets - 10 reps - 1 hold - Seated Long Arc Quad  - 1 x daily - 4 x weekly - 3 sets - 12 reps - 3 hold - Seated Hip Adduction Isometrics with Ball  - 1 x daily - 4 x weekly - 3 sets - 12 reps - 3 hold - Standing March with Counter Support  - 1 x daily - 3 x weekly - 2 sets - 10 reps - Standing Hip Abduction with Counter Support  - 1 x daily - 3 x weekly - 2 sets - 10 reps - Standing Hip Extension with Counter Support  - 1 x daily - 3 x weekly - 2 sets - 10 reps - Mini Squat with Counter Support  - 1 x daily - 3 x weekly - 2 sets - 10 reps - Standing Knee Flexion with Counter Support  - 1 x daily - 3 x weekly - 2 sets - 10 reps - Standing Tandem Balance with Counter Support  - 1 x daily - 3 x weekly - 2 sets - 4 reps - 10 hold  GOALS: Goals reviewed with patient? Yes   SHORT TERM GOALS: Target date: 12/28/23    Patient will be independent in home exercise program to improve strength/mobility for better functional independence with ADLs. Baseline:to be given at next session; 10/23/2023- Patient reports independence with HEP provided and no  significant issues.   Goal status: MET 2. Pt will be able to ambulate for at least 5 min without stopping with LRAD to allow improved access to community and return to PLOF  Baseline: 3 minutes.   Goal Status: initial  LONG TERM GOALS: Target date: 01/31/2024    Patient will increase FOTO score to equal to or greater than  61   to demonstrate statistically significant improvement in mobility and quality of life.  Baseline: 56; 10/23/2023= 60 Goal status: PROGRESSING  2.  Patient (> 80 years old) will complete five times sit to stand test in < 15 seconds  without UE support indicating an increased LE strength and improved balance. Baseline: 30.86 sec; 10/23/2023= 22.08 using 1 UE support.  12/11: 14.67 sec BUE from arm rest. And 14.12 with 1 UE pushing from arm rest. Goal status: MET/ Adjusted   3.  Patient will increase Berg Balance score to >45 points to demonstrate decreased fall risk during functional activities  Baseline: 26; 10/23/2023= 30 12/11: 34 Goal status: MET/ Adjusted   4.  Patient will increase 10 meter walk test to >1.54m/s as to improve gait speed for better community ambulation and to reduce fall risk. Baseline: 0.619m/s; 10/23/2023= 0.88 m/s (normal speed with 4WW) 12/11: Average Fast speed: 1.05 m/s Average Normal speed: 0.834 m/s Goal status: PROGRESSING  5.  Patient will reduce timed up and go to <11 seconds to reduce fall risk and demonstrate improved transfer/gait ability. Baseline: 29.39 12/2: 16.4 sec with Rollator  12/11: 15.19 sec with rollator  2/4: 17.38 sec with rollator   Goal status: IN PROGRESS  6.  Patient will 2 min walk test by at least 18ft as to demonstrate reduced fall risk and improved dynamic gait balance for better safety with community/home ambulation.   Baseline: 212ft; 10/23/2023= 360 feet  with 4WW 345ft with 4WW Goal status: MET  7. Pt will complete 6 min walk test without stopping and improve overall score by at least 137ft  to indicate increased access to community.    BaseLine: to be complete  Goal: INITIAL    ASSESSMENT: CLINICAL IMPRESSION:   Pt presents for progress note this date. Pt shows progress with BERG balance test showing improved balance and decreased fall risk. Based on score pt still needs AD with everyday tasks. Need to assess at next session for baseline measure.Patient presents with good motivation for completion of physical therapy activities. PT treatment focused on functional strengthen training and dynamic balance training. Pt reports that she has increased fatigue levels from not being as active over the last 3 weeks.  Pt will continue to benefit from skilled physical therapy intervention to address impairments, improve QOL, and attain therapy goals.      OBJECTIVE IMPAIRMENTS: Abnormal gait, cardiopulmonary status limiting activity, decreased activity tolerance, decreased balance, decreased endurance, decreased mobility, difficulty walking, decreased ROM, decreased strength, decreased safety awareness, hypomobility, increased fascial restrictions, impaired flexibility, improper body mechanics, and postural dysfunction.   ACTIVITY LIMITATIONS: lifting, standing, squatting, transfers, and locomotion level  PARTICIPATION LIMITATIONS: laundry, shopping, and community activity  PERSONAL FACTORS: 3+ comorbidities: scoliosis, hx of CA, and PE  are also affecting patient's functional outcome.   REHAB POTENTIAL: Good  CLINICAL DECISION MAKING: Stable/uncomplicated  EVALUATION COMPLEXITY: Moderate  PLAN:  PT FREQUENCY: 1-2x/week  PT DURATION: 12 weeks  PLANNED INTERVENTIONS: Therapeutic exercises, Therapeutic activity, Neuromuscular re-education, Balance training, Gait training, Patient/Family education, Self Care, Joint mobilization, Stair training, Vestibular training, DME instructions, Dry Needling, Spinal mobilization, Cryotherapy, Moist heat, and Manual therapy  PLAN FOR NEXT  SESSION:   Continue Dynamic balance training. BLE and postural strengthening.    Lonni KATHEE Gainer, PT 01/02/2024, 1:16 PM

## 2024-01-03 ENCOUNTER — Inpatient Hospital Stay: Payer: Medicare Other | Attending: Oncology

## 2024-01-03 ENCOUNTER — Inpatient Hospital Stay (HOSPITAL_BASED_OUTPATIENT_CLINIC_OR_DEPARTMENT_OTHER): Payer: Medicare Other | Admitting: Oncology

## 2024-01-03 ENCOUNTER — Encounter: Payer: Self-pay | Admitting: Oncology

## 2024-01-03 VITALS — BP 133/69 | HR 80 | Temp 97.4°F | Resp 18 | Wt 88.3 lb

## 2024-01-03 DIAGNOSIS — Z85038 Personal history of other malignant neoplasm of large intestine: Secondary | ICD-10-CM | POA: Diagnosis not present

## 2024-01-03 DIAGNOSIS — Z08 Encounter for follow-up examination after completed treatment for malignant neoplasm: Secondary | ICD-10-CM

## 2024-01-03 DIAGNOSIS — Z79899 Other long term (current) drug therapy: Secondary | ICD-10-CM | POA: Insufficient documentation

## 2024-01-03 DIAGNOSIS — C182 Malignant neoplasm of ascending colon: Secondary | ICD-10-CM | POA: Insufficient documentation

## 2024-01-03 DIAGNOSIS — D509 Iron deficiency anemia, unspecified: Secondary | ICD-10-CM | POA: Diagnosis not present

## 2024-01-03 DIAGNOSIS — D508 Other iron deficiency anemias: Secondary | ICD-10-CM | POA: Diagnosis not present

## 2024-01-03 DIAGNOSIS — E538 Deficiency of other specified B group vitamins: Secondary | ICD-10-CM | POA: Insufficient documentation

## 2024-01-03 LAB — CBC WITH DIFFERENTIAL/PLATELET
Abs Immature Granulocytes: 0.02 10*3/uL (ref 0.00–0.07)
Basophils Absolute: 0 10*3/uL (ref 0.0–0.1)
Basophils Relative: 1 %
Eosinophils Absolute: 0 10*3/uL (ref 0.0–0.5)
Eosinophils Relative: 1 %
HCT: 38.3 % (ref 36.0–46.0)
Hemoglobin: 12.2 g/dL (ref 12.0–15.0)
Immature Granulocytes: 0 %
Lymphocytes Relative: 14 %
Lymphs Abs: 0.8 10*3/uL (ref 0.7–4.0)
MCH: 31.6 pg (ref 26.0–34.0)
MCHC: 31.9 g/dL (ref 30.0–36.0)
MCV: 99.2 fL (ref 80.0–100.0)
Monocytes Absolute: 0.4 10*3/uL (ref 0.1–1.0)
Monocytes Relative: 6 %
Neutro Abs: 4.5 10*3/uL (ref 1.7–7.7)
Neutrophils Relative %: 78 %
Platelets: 213 10*3/uL (ref 150–400)
RBC: 3.86 MIL/uL — ABNORMAL LOW (ref 3.87–5.11)
RDW: 13.4 % (ref 11.5–15.5)
WBC: 5.8 10*3/uL (ref 4.0–10.5)
nRBC: 0 % (ref 0.0–0.2)

## 2024-01-03 LAB — VITAMIN B12: Vitamin B-12: 238 pg/mL (ref 180–914)

## 2024-01-03 LAB — COMPREHENSIVE METABOLIC PANEL
ALT: 13 U/L (ref 0–44)
AST: 21 U/L (ref 15–41)
Albumin: 4.7 g/dL (ref 3.5–5.0)
Alkaline Phosphatase: 63 U/L (ref 38–126)
Anion gap: 10 (ref 5–15)
BUN: 14 mg/dL (ref 8–23)
CO2: 31 mmol/L (ref 22–32)
Calcium: 9.7 mg/dL (ref 8.9–10.3)
Chloride: 95 mmol/L — ABNORMAL LOW (ref 98–111)
Creatinine, Ser: 0.87 mg/dL (ref 0.44–1.00)
GFR, Estimated: >60 mL/min (ref >60)
Glucose, Bld: 78 mg/dL (ref 70–99)
Potassium: 3.7 mmol/L (ref 3.5–5.1)
Sodium: 136 mmol/L (ref 135–145)
Total Bilirubin: 0.8 mg/dL (ref 0.0–1.2)
Total Protein: 7.8 g/dL (ref 6.5–8.1)

## 2024-01-03 LAB — FOLATE: Folate: 39 ng/mL (ref >5.9)

## 2024-01-03 LAB — FERRITIN: Ferritin: 43 ng/mL (ref 11–307)

## 2024-01-03 LAB — IRON AND TIBC
Iron: 78 ug/dL (ref 28–170)
Saturation Ratios: 22 % (ref 10.4–31.8)
TIBC: 357 ug/dL (ref 250–450)
UIBC: 279 ug/dL

## 2024-01-03 NOTE — Progress Notes (Signed)
 Hematology/Oncology Consult note [facility]  Telephone:([phone] [phone]  Patient Care Team: Sherial Bail, MD as PCP - General (Internal Medicine) Darliss Rogue, MD as PCP - Cardiology (Cardiology) Melanee Annah BROCKS, MD as Consulting Physician (Hematology and Oncology)   Name of the patient: Kristina Rowe  [id number]  1930/04/18   Date of visit: 01/03/24  Diagnosis-history of stage III colon cancer currently in remission  Chief complaint/ Reason for visit-routine follow-up of colon cancer  Heme/Onc history: Patient is a 89 year old female who initially presented to the ER with symptoms of significant abdominal pain.  CT abdomen and pelvis on 03/10/2021 showed volvulus along with a large hiatal hernia.  Patient initially had an exploratory laparotomy with lysis of adhesion by Dr. Desiderio.  She then presented back to the ER with similar complaints and a repeat CT abdomen at that time showed bulky ascending colon mass compatible with carcinoma and possible local regional adenopathy.  No recurrent volvulus.  Patient underwent open right colectomy on 03/18/2021.  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  All regional lymph nodes 32 of them were negative for tumor but there was a tumor deposit present at 1 site.  [id number].   Risks versus benefits of adjuvant chemotherapy was discussed and patient was not deemed to be a candidate for adjuvant chemotherapy and she did not wish to go through chemotherapy herself.  Anemia work-up was consistent with iron deficiency and patient received 2 doses of Feraheme   Interval history-she is doing well for her age. No recent falls.  She was hospitalized in September 2024 for pneumonia.  Denies any abdominal pain or diarrhea.  She has known hiatal hernia and has been modifying her diet to keep her symptoms under control.  Occasional  self-limited nausea  ECOG PS- 2 Pain scale- 0   Review of systems- Review of Systems  Constitutional:  Negative for chills, fever, malaise/fatigue and weight loss.  HENT:  Negative for congestion, ear discharge and nosebleeds.   Eyes:  Negative for blurred vision.  Respiratory:  Negative for cough, hemoptysis, sputum production, shortness of breath and wheezing.   Cardiovascular:  Negative for chest pain, palpitations, orthopnea and claudication.  Gastrointestinal:  Negative for abdominal pain, blood in stool, constipation, diarrhea, heartburn, melena, nausea and vomiting.  Genitourinary:  Negative for dysuria, flank pain, frequency, hematuria and urgency.  Musculoskeletal:  Negative for back pain, joint pain and myalgias.  Skin:  Negative for rash.  Neurological:  Negative for dizziness, tingling, focal weakness, seizures, weakness and headaches.  Endo/Heme/Allergies:  Does not bruise/bleed easily.  Psychiatric/Behavioral:  Negative for depression and suicidal ideas. The patient does not have insomnia.       Allergies  Allergen Reactions   Metronidazole  Other (See Comments)    Other reaction(s): Other Mouth sores Mouth sores Mouth sores  Other reaction(s): Other (See Comments) Mouth sores Other reaction(s): Other  Mouth sores    Omeprazole Other (See Comments)    Other reaction(s): Arthralgias (intolerance) Other reaction(s): Other  Other reaction(s): Arthralgias (intolerance), Other (See Comments) Other reaction(s): Arthralgias (intolerance)  Other reaction(s): Other    Bactrim  [Sulfamethoxazole -Trimethoprim ] Nausea Only   Codeine Nausea And Vomiting and Nausea Only    Other reaction(s): Nausea Only But tolerates tramadol  But tolerates tramadol Other reaction(s): Nausea Only  But tolerates tramadol    Gemtesa  [Vibegron ] Nausea Only   Propoxyphene Other (See Comments)    But tolerates tramadol and tylenol   Past Medical History:  Diagnosis Date    Ductal carcinoma in situ (DCIS) of left breast    Dysphagia    Esophageal stricture    Gastroparesis    GERD (gastroesophageal reflux disease)    Hiatal hernia    Hypertension    Iron deficiency anemia    Osteoporosis    Pulmonary embolism (HCC)    Scoliosis    UTI (urinary tract infection)      Past Surgical History:  Procedure Laterality Date   BREAST LUMPECTOMY Left 2009   Ductal Carcinoma insitu    CATARACT EXTRACTION, BILATERAL     COLONOSCOPY N/A 03/17/2021   Procedure: COLONOSCOPY;  Surgeon: Maryruth Ole DASEN, MD;  Location: [facility] ENDOSCOPY;  Service: Endoscopy;  Laterality: N/A;   COLOSTOMY REVISION Right 03/18/2021   Procedure: COLON RESECTION RIGHT;  Surgeon: Desiderio Schanz, MD;  Location: [facility] ORS;  Service: General;  Laterality: Right;   LAPAROSCOPIC PARAESOPHAGEAL HERNIA REPAIR  2009   LAPAROTOMY N/A 03/10/2021   Procedure: EXPLORATORY LAPAROTOMY;  Surgeon: Desiderio Schanz, MD;  Location: [facility] ORS;  Service: General;  Laterality: N/A;   TUBAL LIGATION      Social History   Socioeconomic History   Marital status: Married    Spouse name: Not on file   Number of children: Not on file   Years of education: Not on file   Highest education level: Not on file  Occupational History   Not on file  Tobacco Use   Smoking status: Never    Passive exposure: Never   Smokeless tobacco: Never  Vaping Use   Vaping status: Never Used  Substance and Sexual Activity   Alcohol use: Never   Drug use: Never   Sexual activity: Not Currently  Other Topics Concern   Not on file  Social History Narrative   Not on file   Social Drivers of Health   Financial Resource Strain: Low Risk  (10/04/2023)   Received from [facility]   Overall Financial Resource Strain (CARDIA)    Difficulty of Paying Living Expenses: Not hard at all  Food Insecurity: No Food Insecurity (10/04/2023)   Received from [facility]   Hunger Vital Sign    Worried About  Running Out of Food in the Last Year: Never true    Ran Out of Food in the Last Year: Never true  Transportation Needs: No Transportation Needs (10/04/2023)   Received from [facility] - Transportation    In the past 12 months, has lack of transportation kept you from medical appointments or from getting medications?: No    Lack of Transportation (Non-Medical): No  Physical Activity: Not on file  Stress: Not on file  Social Connections: Not on file  Intimate Partner Violence: Not on file    Family History  Problem Relation Age of Onset   Colon cancer Mother    Emphysema Father    Cholelithiasis Sister    Factor V Leiden deficiency Sister    Thyroid  disease Sister    Cholelithiasis Sister    Pulmonary embolism Sister    Deep vein thrombosis Brother    Liver cancer Brother    Stroke Brother        died age 59 June 2022, D-Day veteran   Cancer Brother        oral cancer   Breast cancer Maternal Uncle      Current Outpatient Medications:    albuterol  (VENTOLIN  HFA) 108 (90 Base)  MCG/ACT inhaler, Inhale into the lungs., Disp: , Rfl:    amLODipine  (NORVASC ) 2.5 MG tablet, Take by mouth., Disp: , Rfl:    spironolactone (ALDACTONE) 25 MG tablet, Take by mouth., Disp: , Rfl:    acetaminophen  (TYLENOL ) 500 MG tablet, Take 500 mg by mouth at bedtime as needed for mild pain, fever or headache., Disp: , Rfl:    Cholecalciferol  50 MCG (2000 UT) CAPS, Take by mouth., Disp: , Rfl:    estradiol  (ESTRACE ) 0.1 MG/GM vaginal cream, Estrogen Cream Instruction Discard applicator Apply pea sized amount to tip of finger to urethra before bed. Wash hands well after application. Use Monday, Wednesday and Friday, Disp: 42.5 g, Rfl: 5   famotidine  (PEPCID ) 40 MG tablet, Take 40 mg by mouth 2 (two) times daily., Disp: , Rfl:    folic acid  (FOLVITE ) 1 MG tablet, Take 1 mg by mouth daily., Disp: , Rfl:    furosemide  (LASIX ) 20 MG tablet, Take 1 tablet (20 mg total) by mouth daily  as needed for fluid or edema., Disp: 30 tablet, Rfl: 2   mirtazapine  (REMERON ) 7.5 MG tablet, Take 7.5 mg by mouth at bedtime., Disp: , Rfl:    ondansetron  (ZOFRAN -ODT) 4 MG disintegrating tablet, Take 1 tablet (4 mg total) by mouth every 8 (eight) hours as needed., Disp: 45 tablet, Rfl: 1   polyethylene glycol (MIRALAX  / GLYCOLAX ) 17 g packet, Take 17 g by mouth daily., Disp: , Rfl:    rivaroxaban  (XARELTO ) 10 MG TABS tablet, Take 10 mg by mouth daily., Disp: , Rfl:   Physical exam:  Vitals:   01/03/24 1402  BP: 133/69  Pulse: 80  Resp: 18  Temp: (!) 97.4 F (36.3 C)  TempSrc: Tympanic  SpO2: 98%  Weight: 88 lb 4.8 oz (40.1 kg)   Physical Exam Constitutional:      Comments: Ambulates with a walker.  She is elderly and frail.  Appears in no acute distress  Cardiovascular:     Rate and Rhythm: Normal rate and regular rhythm.     Heart sounds: Normal heart sounds.  Pulmonary:     Effort: Pulmonary effort is normal.     Breath sounds: Normal breath sounds.  Abdominal:     General: Bowel sounds are normal.     Palpations: Abdomen is soft.  Skin:    General: Skin is warm and dry.  Neurological:     Mental Status: She is alert and oriented to person, place, and time.         Latest Ref Rng & Units 01/03/2024    1:38 PM  CMP  Glucose 70 - 99 mg/dL 78   BUN 8 - 23 mg/dL 14   Creatinine 9.55 - 1.00 mg/dL 9.12   Sodium 864 - 854 mmol/L 136   Potassium 3.5 - 5.1 mmol/L 3.7   Chloride 98 - 111 mmol/L 95   CO2 22 - 32 mmol/L 31   Calcium  8.9 - 10.3 mg/dL 9.7   Total Protein 6.5 - 8.1 g/dL 7.8   Total Bilirubin 0.0 - 1.2 mg/dL 0.8   Alkaline Phos 38 - 126 U/L 63   AST 15 - 41 U/L 21   ALT 0 - 44 U/L 13       Latest Ref Rng & Units 01/03/2024    1:38 PM  CBC  WBC 4.0 - 10.5 K/uL 5.8   Hemoglobin 12.0 - 15.0 g/dL 87.7   Hematocrit 63.9 - 46.0 % 38.3   Platelets 150 - 400  K/uL 213     No images are attached to the encounter.  CT CHEST ABDOMEN PELVIS W CONTRAST Result  Date: 12/30/2023 CLINICAL DATA:  Surveillance of colon cancer status post resection * Tracking Code: BO * EXAM: CT CHEST, ABDOMEN, AND PELVIS WITH CONTRAST TECHNIQUE: Multidetector CT imaging of the chest, abdomen and pelvis was performed following the standard protocol during bolus administration of intravenous contrast. RADIATION DOSE REDUCTION: This exam was performed according to the departmental dose-optimization program which includes automated exposure control, adjustment of the mA and/or kV according to patient size and/or use of iterative reconstruction technique. CONTRAST:  75mL OMNIPAQUE  IOHEXOL  300 MG/ML SOLN additional oral enteric contrast COMPARISON:  06/12/2023 FINDINGS: CT CHEST FINDINGS Cardiovascular: Aortic atherosclerosis. Normal heart size. No pericardial effusion. Mediastinum/Nodes: No enlarged mediastinal, hilar, or axillary lymph nodes. Moderate hiatal hernia with intrathoracic position of the gastric fundus. Thyroid  gland, trachea, and esophagus demonstrate no significant findings. Lungs/Pleura: Elevation of the hemidiaphragms with bibasilar scarring and atelectasis. No pleural effusion or pneumothorax. Musculoskeletal: No chest wall abnormality. No acute osseous findings. CT ABDOMEN PELVIS FINDINGS Hepatobiliary: No solid liver abnormality is seen. No gallstones, gallbladder wall thickening, or biliary dilatation. Pancreas: Unremarkable. No pancreatic ductal dilatation or surrounding inflammatory changes. Spleen: Normal in size without significant abnormality. Adrenals/Urinary Tract: Adrenal glands are unremarkable. Kidneys are normal, without renal calculi, solid lesion, or hydronephrosis. Bladder is unremarkable. Stomach/Bowel: Stomach is within normal limits. Status post partial right hemicolectomy. No evidence of bowel wall thickening, distention, or inflammatory changes. Sigmoid diverticulosis. Vascular/Lymphatic: Scattered aortic atherosclerosis. No enlarged abdominal or pelvic lymph  nodes. Reproductive: No mass or other abnormality. Other: No abdominal wall hernia or abnormality. No ascites. Musculoskeletal: No acute osseous findings. Severe levoscoliosis of the thoracolumbar spine. IMPRESSION: 1. Status post partial right hemicolectomy. 2. No evidence of recurrent or metastatic disease in the chest, abdomen, or pelvis. 3. Moderate hiatal hernia with intrathoracic position of the gastric fundus. 4. Sigmoid diverticulosis without evidence of acute diverticulitis. Aortic Atherosclerosis (ICD10-I70.0). Electronically Signed   By: Marolyn JONETTA Jaksch M.D.   On: 12/30/2023 08:11     Assessment and plan- Patient is a 89 y.o. female with history of stage III colon cancer s/p right colectomy in April 2022.  She is here for a routine surveillance visit for colon cancer  CT chest abdomen and pelvis with contrast from 12/27/2023 did not show any evidence of recurrent or progressive disease.  She would like to continue surveillance imaging at this time and I will therefore see her back in 6 months with scans.  She has a history of iron deficiency anemia and iron studies from today are currently pending   Visit Diagnosis 1. Encounter for follow-up surveillance of colon cancer   2. Other iron deficiency anemia      Dr. Annah Skene, MD, MPH [facility] at [facility] [phone] 01/03/2024 2:51 PM

## 2024-01-04 ENCOUNTER — Encounter: Payer: Self-pay | Admitting: Oncology

## 2024-01-04 ENCOUNTER — Ambulatory Visit: Payer: Medicare Other | Admitting: Physical Therapy

## 2024-01-04 LAB — CEA: CEA: 2.7 ng/mL (ref 0.0–4.7)

## 2024-01-05 ENCOUNTER — Telehealth: Payer: Self-pay | Admitting: *Deleted

## 2024-01-05 ENCOUNTER — Ambulatory Visit: Payer: Medicare Other | Admitting: Oncology

## 2024-01-05 ENCOUNTER — Other Ambulatory Visit: Payer: Medicare Other

## 2024-01-05 ENCOUNTER — Telehealth: Payer: Self-pay

## 2024-01-05 NOTE — Telephone Encounter (Signed)
 Patient's daughter states patient is not taking b12 that she is aware and she is sure patient would like to come into clinic to get injections since daughters do not live with her. She states patient has been complaining on low energy levels and headaches and would like to proceed. Megan please call patient at [phone] to schedule. Thanks

## 2024-01-05 NOTE — Telephone Encounter (Signed)
 Patient called back to the office because Dr. Melanee said that she could come in and start getting B12 injections.  She wants to start them on next Wednesday in the afternoon.  I told the patient that the scheduling staff has left for today and I would send a message to that scheduler and she will call you back with the time.  Patient is agreeable to that

## 2024-01-05 NOTE — Telephone Encounter (Signed)
-----   Message from Avonne Boettcher sent at 01/04/2024  8:32 AM EST ----- B12 levels are mildly low. Is she taking oral b12? Does she want monthly b12 injections?can someone give it to her or does she want to come to cancer center for this?

## 2024-01-08 NOTE — Telephone Encounter (Signed)
 Please let her know usually we continue monthly b12 injcetions for as long as she can come or someone can give her at home. If she cannot come long term, we can switch her to po b12 1000 mcg daily after 3-4 months

## 2024-01-09 ENCOUNTER — Ambulatory Visit: Payer: Medicare Other | Admitting: Physical Therapy

## 2024-01-09 DIAGNOSIS — R2681 Unsteadiness on feet: Secondary | ICD-10-CM

## 2024-01-09 DIAGNOSIS — M6281 Muscle weakness (generalized): Secondary | ICD-10-CM

## 2024-01-09 DIAGNOSIS — R262 Difficulty in walking, not elsewhere classified: Secondary | ICD-10-CM

## 2024-01-09 DIAGNOSIS — R2689 Other abnormalities of gait and mobility: Secondary | ICD-10-CM

## 2024-01-09 NOTE — Therapy (Signed)
OUTPATIENT PHYSICAL THERAPY NEURO TREATMENT NOTE    Patient Name: Kristina Rowe MRN: [id number] DOB:06-29-1930, 89 y.o., female Today's Date: 01/09/2024   PCP: Enid Baas, MD  REFERRING PROVIDER:   Ignacia Bayley, PA-C    END OF SESSION:  PT End of Session - 01/09/24 1315     Visit Number 21    Number of Visits 38    Date for PT Re-Evaluation 01/31/24    Progress Note Due on Visit 30    PT Start Time 1318    PT Stop Time 1356    PT Time Calculation (min) 38 min    Equipment Utilized During Treatment Gait belt    Activity Tolerance Patient tolerated treatment well;No increased pain    Behavior During Therapy WFL for tasks assessed/performed                   Past Medical History:  Diagnosis Date   Ductal carcinoma in situ (DCIS) of left breast    Dysphagia    Esophageal stricture    Gastroparesis    GERD (gastroesophageal reflux disease)    Hiatal hernia    Hypertension    Iron deficiency anemia    Osteoporosis    Pulmonary embolism (HCC)    Scoliosis    UTI (urinary tract infection)    Past Surgical History:  Procedure Laterality Date   BREAST LUMPECTOMY Left 2009   Ductal Carcinoma insitu    CATARACT EXTRACTION, BILATERAL     COLONOSCOPY N/A 03/17/2021   Procedure: COLONOSCOPY;  Surgeon: Regis Bill, MD;  Location: [facility];  Service: Endoscopy;  Laterality: N/A;   COLOSTOMY REVISION Right 03/18/2021   Procedure: COLON RESECTION RIGHT;  Surgeon: Henrene Dodge, MD;  Location: [facility];  Service: General;  Laterality: Right;   LAPAROSCOPIC PARAESOPHAGEAL HERNIA REPAIR  2009   LAPAROTOMY N/A 03/10/2021   Procedure: EXPLORATORY LAPAROTOMY;  Surgeon: Henrene Dodge, MD;  Location: [facility];  Service: General;  Laterality: N/A;   TUBAL LIGATION     Patient Active Problem List   Diagnosis Date Noted   Acute on chronic diastolic CHF (congestive heart failure) (HCC) 08/19/2023   Acute respiratory failure with hypoxia (HCC) 08/17/2023    History of pulmonary embolus (PE) 08/17/2023   Basal cell carcinoma of leg 04/01/2021   Coronary atherosclerosis 04/01/2021   History of pulmonary embolism 04/01/2021   Pulmonary nodule 04/01/2021   Malignant neoplasm of colon (HCC) 03/29/2021   Rectal bleeding 03/15/2021   Hypertension    GERD (gastroesophageal reflux disease)    Colonic mass    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HCC) 03/10/2021   Anemia, unspecified 03/06/2021   Bronchiectasis without complication (HCC) 03/27/2020   Iron deficiency anemia 03/27/2020   Moderate aortic stenosis 03/27/2020   Stage 3a chronic kidney disease (HCC) 07/04/2019   Hypnic headache 05/28/2019   Telogen effluvium 04/17/2019   Xerosis cutis 04/17/2019   Essential hypertension 01/14/2019   Cervical radiculopathy 05/04/2018   Osteoarthritis of left glenohumeral joint 05/04/2018   Vitamin B12 deficiency 01/01/2018   Vascular abnormality 09/11/2017   Dyspepsia 03/28/2017   Pulmonary embolism (HCC) 08/16/2016   Dilated pore of [place] 03/23/2015   Retinal drusen of both eyes 08/28/2014   Status post right cataract extraction 07/30/2014   Verruca 03/19/2014   Chronic back pain 11/06/2013   Postmenopausal osteoporosis 06/11/2013   Anticoagulated on Coumadin 04/06/2013   Long term (current) use of anticoagulants 10/18/2012   Preglaucoma 05/10/2012   Presbyopia 05/10/2012   Senile nuclear sclerosis  05/10/2012   Lobular carcinoma in situ of breast 04/13/2012   Closed fracture of ankle 02/27/2012   History of nonmelanoma skin cancer 09/05/2011   Other benign neoplasm of skin of trunk 09/05/2011   Osteoporosis 08/25/2011   Transient alteration of awareness 09/03/2009   Colon adenoma 07/30/2002    ONSET DATE: 3 months   REFERRING DIAG:  Diagnosis  R53.83 (ICD-10-CM) - Other fatigue    THERAPY DIAG:  Muscle weakness (generalized)  Difficulty in walking, not elsewhere classified  Unsteadiness on feet  Other abnormalities of gait and  mobility  Rationale for Evaluation and Treatment: Rehabilitation  SUBJECTIVE:                                                                                                                                                                                             SUBJECTIVE STATEMENT:  Pt reports doing well today. Pt denies any recent falls/stumbles since prior session. Pt denies any updates to medications or medical appointment since prior session. Pt reports good compliance with HEP when time permits.   Pt accompanied by: self  PERTINENT HISTORY:  From recent MD visit.  89 y.o. here for an acute issue. She has a PMH of chronic PE/Coumadin, anemia, colon cancer, breast cancer, large hiatal hernia, moderate aortic stenosis who has concerns about shortness of breath, nausea, generalized weakness. No chest pain. History of hiatal hernia and recent CT chest. Also recent endoscopy. Takes Zofran chronically. Has felt weaker and almost fell recently. Uses a walker. Also on chronic UTI suppression, Keflex.   PAIN:  Are you having pain? Yes: NPRS scale: 5/10 Pain location: low back  Pain description: sore  Aggravating factors: movement  Relieving factors: rest   PRECAUTIONS: Fall  RED FLAGS: None   WEIGHT BEARING RESTRICTIONS: No  FALLS: Has patient fallen in last 6 months? No  LIVING ENVIRONMENT: Lives with: lives alone Lives in: House/apartment Stairs: Yes: External: 1 steps; none Has following equipment at home: Walker - 4 wheeled  PLOF: Independent with household mobility with device and Independent with community mobility with device  PATIENT GOALS: improved strength in BLE   OBJECTIVE:   DIAGNOSTIC FINDINGS:  06/12/2023 EXAM: CT CHEST, ABDOMEN, AND PELVIS WITH CONTRAST IMPRESSION: 1. Status post right hemicolectomy, without recurrent or metastatic disease. Decreased sensitivity exam, primarily involving the abdomen pelvis, due to paucity of fat. 2. New tiny left  groin hernia containing fluid. Consider physical exam correlation. 3. Incidental findings, including: Coronary artery atherosclerosis. Aortic Atherosclerosis (ICD10-I70.0). Dilated esophagus with contrast throughout, suggesting dysmotility and/or gastroesophageal reflux.  COGNITION: Overall cognitive status: Within functional limits for tasks assessed  SENSATION: Light touch: Impaired  mild paraesthesia in the proximal RLE   COORDINATION: WFL. Ankle to knee limited ROM due to weakness in the R hip      POSTURE: rounded shoulders, forward head, flexed trunk , and severe scoliosis     LOWER EXTREMITY MMT:    MMT Right Eval Left Eval  Hip flexion 4- 4  Hip extension    Hip abduction 4- 44  Hip adduction 4 4  Hip internal rotation    Hip external rotation    Knee flexion 4- 4  Knee extension 4 4+  Ankle dorsiflexion 4 4+  Ankle plantarflexion    Ankle inversion    Ankle eversion    (Blank rows = not tested)    TRANSFERS: Assistive device utilized: Environmental consultant - 4 wheeled  Sit to stand: SBA Stand to sit: SBA Chair to chair: SBA Floor:  Unable to perform    STAIRS: Level of Assistance:  pt declines Stair Negotiation Technique: Step to Pattern with Bilateral Rails Number of Stairs: 4  Height of Stairs: 6  Comments: will need to assess.   GAIT: Gait pattern: step through pattern, Right foot flat, and lateral hip instability Distance walked: 266ft Assistive device utilized: Walker - 4 wheeled Level of assistance: Modified independence and SBA Comments: severe scoliosis.    FUNCTIONAL TESTS:  5 times sit to stand: 30.86 Timed up and go (TUG): 29.39 2 minute walk test: 236ft  10 meter walk test: 0.679m/s, SOB 4-5.  Berg Balance Scale: TBD  PATIENT SURVEYS:  FOTO 56. goal 64  TODAY'S TREATMENT:                                                                                                                              DATE:  01/09/24  TA- To improve  functional movements patterns for everyday tasks Sit<>stand x 10 with BUE support.  Ambulation x 320 ft with 4WW Sit<>stand x 10 with BUE support.  Ambulation x 320 ft with 4WW Standing on airex pad 4 x 30 sec   Physical Performance : 880 ft, assessed pt fatigue and gait throughout.     NMR: To facilitate reeducation of movement, balance, posture, coordination, and/or proprioception/kinesthetic sense. No UE assist  Step tap to 6 in step 2 x 5 ea LE with standing rest between steps  Airex stance 4 x 30 sec Eyes closed stance 4 x 30 sec   Cues for improved posture and step length as able. Reports mild low back pain following step ascent.    PATIENT EDUCATION: Education details: Pt educated throughout session about proper posture and technique with exercises. Improved exercise technique, movement at target joints, use of target muscles after min to mod verbal, visual, tactile cues.  Person educated: Patient Education method: Explanation Education comprehension: verbalized understanding and returned demonstration  HOME EXERCISE PROGRAM: Access Code: [email or web address] URL: [email or web address] Date: 09/25/2023 Prepared by: Grier Rocher  Exercises - Seated Hip Abduction with Resistance  -  1 x daily - 4 x weekly - 3 sets - 15 reps - 3 hold - Seated March with Resistance  - 1 x daily - 4 x weekly - 3 sets - 10 reps - 1 hold - Seated Long Arc Quad  - 1 x daily - 4 x weekly - 3 sets - 12 reps - 3 hold - Seated Hip Adduction Isometrics with Ball  - 1 x daily - 4 x weekly - 3 sets - 12 reps - 3 hold - Standing March with Counter Support  - 1 x daily - 3 x weekly - 2 sets - 10 reps - Standing Hip Abduction with Counter Support  - 1 x daily - 3 x weekly - 2 sets - 10 reps - Standing Hip Extension with Counter Support  - 1 x daily - 3 x weekly - 2 sets - 10 reps - Mini Squat with Counter Support  - 1 x daily - 3 x weekly - 2 sets - 10 reps - Standing Knee Flexion with Counter  Support  - 1 x daily - 3 x weekly - 2 sets - 10 reps - Standing Tandem Balance with Counter Support  - 1 x daily - 3 x weekly - 2 sets - 4 reps - 10 hold  GOALS: Goals reviewed with patient? Yes   SHORT TERM GOALS: Target date: 12/28/23    Patient will be independent in home exercise program to improve strength/mobility for better functional independence with ADLs. Baseline:to be given at next session; 10/23/2023- Patient reports independence with HEP provided and no significant issues.   Goal status: MET 2. Pt will be able to ambulate for at least 5 min without stopping with LRAD to allow improved access to community and return to PLOF  Baseline: 3 minutes. 2/11: 6 minute walk test completed.   Goal Status: MET    LONG TERM GOALS: Target date: 01/31/2024    Patient will increase FOTO score to equal to or greater than  61   to demonstrate statistically significant improvement in mobility and quality of life.  Baseline: 56; 10/23/2023= 60 Goal status: PROGRESSING  2.  Patient (> 20 years old) will complete five times sit to stand test in < 15 seconds  without UE support indicating an increased LE strength and improved balance. Baseline: 30.86 sec; 10/23/2023= 22.08 using 1 UE support.  12/11: 14.67 sec BUE from arm rest. And 14.12 with 1 UE pushing from arm rest. Goal status: MET/ Adjusted   3.  Patient will increase Berg Balance score to >45 points to demonstrate decreased fall risk during functional activities  Baseline: 26; 10/23/2023= 30 12/11: 34 Goal status: MET/ Adjusted   4.  Patient will increase 10 meter walk test to >1.23m/s as to improve gait speed for better community ambulation and to reduce fall risk. Baseline: 0.674m/s; 10/23/2023= 0.88 m/s (normal speed with 4WW) 12/11: Average Fast speed: 1.05 m/s Average Normal speed: 0.834 m/s Goal status: PROGRESSING  5.  Patient will reduce timed up and go to <11 seconds to reduce fall risk and demonstrate improved  transfer/gait ability. Baseline: 29.39 12/2: 16.4 sec with Rollator  12/11: 15.19 sec with rollator  2/4: 17.38 sec with rollator   Goal status: IN PROGRESS  6.  Patient will 2 min walk test by at least 72ft as to demonstrate reduced fall risk and improved dynamic gait balance for better safety with community/home ambulation.   Baseline: 259ft; 10/23/2023= 360 feet  with 4WW 378ft with 2DJ  Goal status: MET  7. Pt will complete 6 min walk test without stopping and improve overall score by at least 152ft to indicate increased access to community.    Baseline: to be complete 2/11:880 ft with RW Goal: INITIAL    ASSESSMENT: CLINICAL IMPRESSION:   Patient arrived with good motivation for completion of pt activities.  Pt progresses with STS and endurance ambulation training this date. Pt shows progress toward goals by ambulating complete 6 MWT and initial baseline for this test was established this date. Pt ambulating at home as she is able and weather permitting is walking outside indicating further functional progress. Pt will continue to benefit from skilled physical therapy intervention to address impairments, improve QOL, and attain therapy goals.       OBJECTIVE IMPAIRMENTS: Abnormal gait, cardiopulmonary status limiting activity, decreased activity tolerance, decreased balance, decreased endurance, decreased mobility, difficulty walking, decreased ROM, decreased strength, decreased safety awareness, hypomobility, increased fascial restrictions, impaired flexibility, improper body mechanics, and postural dysfunction.   ACTIVITY LIMITATIONS: lifting, standing, squatting, transfers, and locomotion level  PARTICIPATION LIMITATIONS: laundry, shopping, and community activity  PERSONAL FACTORS: 3+ comorbidities: scoliosis, hx of CA, and PE  are also affecting patient's functional outcome.   REHAB POTENTIAL: Good  CLINICAL DECISION MAKING: Stable/uncomplicated  EVALUATION COMPLEXITY:  Moderate  PLAN:  PT FREQUENCY: 1-2x/week  PT DURATION: 12 weeks  PLANNED INTERVENTIONS: Therapeutic exercises, Therapeutic activity, Neuromuscular re-education, Balance training, Gait training, Patient/Family education, Self Care, Joint mobilization, Stair training, Vestibular training, DME instructions, Dry Needling, Spinal mobilization, Cryotherapy, Moist heat, and Manual therapy  PLAN FOR NEXT SESSION:   Continue Dynamic balance training. BLE and postural strengthening.    Norman Herrlich, PT 01/09/2024, 1:21 PM

## 2024-01-10 ENCOUNTER — Inpatient Hospital Stay: Payer: Medicare Other

## 2024-01-10 ENCOUNTER — Other Ambulatory Visit: Payer: Self-pay | Admitting: Oncology

## 2024-01-10 DIAGNOSIS — D508 Other iron deficiency anemias: Secondary | ICD-10-CM

## 2024-01-10 DIAGNOSIS — C182 Malignant neoplasm of ascending colon: Secondary | ICD-10-CM | POA: Diagnosis not present

## 2024-01-10 MED ORDER — CYANOCOBALAMIN 1000 MCG/ML IJ SOLN
1000.0000 ug | INTRAMUSCULAR | Status: DC
  Administered 2024-01-10: 1000 ug via INTRAMUSCULAR
  Filled 2024-01-10: qty 1

## 2024-01-11 ENCOUNTER — Ambulatory Visit: Payer: Medicare Other

## 2024-01-15 NOTE — Therapy (Signed)
 OUTPATIENT PHYSICAL THERAPY NEURO TREATMENT NOTE    Patient Name: Kristina Rowe MRN: [id number] DOB:1930/02/08, 89 y.o., female Today's Date: 01/16/2024   PCP: Enid Baas, MD  REFERRING PROVIDER:   Ignacia Bayley, PA-C    END OF SESSION:  PT End of Session - 01/16/24 1402     Visit Number 22    Number of Visits 38    Date for PT Re-Evaluation 01/31/24    Progress Note Due on Visit 30    PT Start Time 1400    PT Stop Time 1444    PT Time Calculation (min) 44 min    Equipment Utilized During Treatment Gait belt    Activity Tolerance Patient tolerated treatment well;No increased pain    Behavior During Therapy WFL for tasks assessed/performed                    Past Medical History:  Diagnosis Date   Ductal carcinoma in situ (DCIS) of left breast    Dysphagia    Esophageal stricture    Gastroparesis    GERD (gastroesophageal reflux disease)    Hiatal hernia    Hypertension    Iron deficiency anemia    Osteoporosis    Pulmonary embolism (HCC)    Scoliosis    UTI (urinary tract infection)    Past Surgical History:  Procedure Laterality Date   BREAST LUMPECTOMY Left 2009   Ductal Carcinoma insitu    CATARACT EXTRACTION, BILATERAL     COLONOSCOPY N/A 03/17/2021   Procedure: COLONOSCOPY;  Surgeon: Regis Bill, MD;  Location: [facility];  Service: Endoscopy;  Laterality: N/A;   COLOSTOMY REVISION Right 03/18/2021   Procedure: COLON RESECTION RIGHT;  Surgeon: Henrene Dodge, MD;  Location: [facility];  Service: General;  Laterality: Right;   LAPAROSCOPIC PARAESOPHAGEAL HERNIA REPAIR  2009   LAPAROTOMY N/A 03/10/2021   Procedure: EXPLORATORY LAPAROTOMY;  Surgeon: Henrene Dodge, MD;  Location: [facility];  Service: General;  Laterality: N/A;   TUBAL LIGATION     Patient Active Problem List   Diagnosis Date Noted   Acute on chronic diastolic CHF (congestive heart failure) (HCC) 08/19/2023   Acute respiratory failure with hypoxia (HCC)  08/17/2023   History of pulmonary embolus (PE) 08/17/2023   Basal cell carcinoma of leg 04/01/2021   Coronary atherosclerosis 04/01/2021   History of pulmonary embolism 04/01/2021   Pulmonary nodule 04/01/2021   Malignant neoplasm of colon (HCC) 03/29/2021   Rectal bleeding 03/15/2021   Hypertension    GERD (gastroesophageal reflux disease)    Colonic mass    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HCC) 03/10/2021   Anemia, unspecified 03/06/2021   Bronchiectasis without complication (HCC) 03/27/2020   Iron deficiency anemia 03/27/2020   Moderate aortic stenosis 03/27/2020   Stage 3a chronic kidney disease (HCC) 07/04/2019   Hypnic headache 05/28/2019   Telogen effluvium 04/17/2019   Xerosis cutis 04/17/2019   Essential hypertension 01/14/2019   Cervical radiculopathy 05/04/2018   Osteoarthritis of left glenohumeral joint 05/04/2018   Vitamin B12 deficiency 01/01/2018   Vascular abnormality 09/11/2017   Dyspepsia 03/28/2017   Pulmonary embolism (HCC) 08/16/2016   Dilated pore of [place] 03/23/2015   Retinal drusen of both eyes 08/28/2014   Status post right cataract extraction 07/30/2014   Verruca 03/19/2014   Chronic back pain 11/06/2013   Postmenopausal osteoporosis 06/11/2013   Anticoagulated on Coumadin 04/06/2013   Long term (current) use of anticoagulants 10/18/2012   Preglaucoma 05/10/2012   Presbyopia 05/10/2012   Senile nuclear  sclerosis 05/10/2012   Lobular carcinoma in situ of breast 04/13/2012   Closed fracture of ankle 02/27/2012   History of nonmelanoma skin cancer 09/05/2011   Other benign neoplasm of skin of trunk 09/05/2011   Osteoporosis 08/25/2011   Transient alteration of awareness 09/03/2009   Colon adenoma 07/30/2002    ONSET DATE: 3 months   REFERRING DIAG:  Diagnosis  R53.83 (ICD-10-CM) - Other fatigue    THERAPY DIAG:  Muscle weakness (generalized)  Difficulty in walking, not elsewhere classified  Unsteadiness on feet  Other abnormalities of gait and  mobility  Rationale for Evaluation and Treatment: Rehabilitation  SUBJECTIVE:                                                                                                                                                                                             SUBJECTIVE STATEMENT:  Patient reports her back is really bothering her.   Pt accompanied by: self  PERTINENT HISTORY:  From recent MD visit.  89 y.o. here for an acute issue. She has a PMH of chronic PE/Coumadin, anemia, colon cancer, breast cancer, large hiatal hernia, moderate aortic stenosis who has concerns about shortness of breath, nausea, generalized weakness. No chest pain. History of hiatal hernia and recent CT chest. Also recent endoscopy. Takes Zofran chronically. Has felt weaker and almost fell recently. Uses a walker. Also on chronic UTI suppression, Keflex.   PAIN:  Are you having pain? Yes: NPRS scale: 5/10 Pain location: low back  Pain description: sore  Aggravating factors: movement  Relieving factors: rest   PRECAUTIONS: Fall  RED FLAGS: None   WEIGHT BEARING RESTRICTIONS: No  FALLS: Has patient fallen in last 6 months? No  LIVING ENVIRONMENT: Lives with: lives alone Lives in: House/apartment Stairs: Yes: External: 1 steps; none Has following equipment at home: Walker - 4 wheeled  PLOF: Independent with household mobility with device and Independent with community mobility with device  PATIENT GOALS: improved strength in BLE   OBJECTIVE:   DIAGNOSTIC FINDINGS:  06/12/2023 EXAM: CT CHEST, ABDOMEN, AND PELVIS WITH CONTRAST IMPRESSION: 1. Status post right hemicolectomy, without recurrent or metastatic disease. Decreased sensitivity exam, primarily involving the abdomen pelvis, due to paucity of fat. 2. New tiny left groin hernia containing fluid. Consider physical exam correlation. 3. Incidental findings, including: Coronary artery atherosclerosis. Aortic Atherosclerosis (ICD10-I70.0).  Dilated esophagus with contrast throughout, suggesting dysmotility and/or gastroesophageal reflux.  COGNITION: Overall cognitive status: Within functional limits for tasks assessed   SENSATION: Light touch: Impaired  mild paraesthesia in the proximal RLE   COORDINATION: WFL. Ankle to knee limited ROM due to weakness in  the R hip      POSTURE: rounded shoulders, forward head, flexed trunk , and severe scoliosis     LOWER EXTREMITY MMT:    MMT Right Eval Left Eval  Hip flexion 4- 4  Hip extension    Hip abduction 4- 44  Hip adduction 4 4  Hip internal rotation    Hip external rotation    Knee flexion 4- 4  Knee extension 4 4+  Ankle dorsiflexion 4 4+  Ankle plantarflexion    Ankle inversion    Ankle eversion    (Blank rows = not tested)    TRANSFERS: Assistive device utilized: Environmental consultant - 4 wheeled  Sit to stand: SBA Stand to sit: SBA Chair to chair: SBA Floor:  Unable to perform    STAIRS: Level of Assistance:  pt declines Stair Negotiation Technique: Step to Pattern with Bilateral Rails Number of Stairs: 4  Height of Stairs: 6  Comments: will need to assess.   GAIT: Gait pattern: step through pattern, Right foot flat, and lateral hip instability Distance walked: 281ft Assistive device utilized: Walker - 4 wheeled Level of assistance: Modified independence and SBA Comments: severe scoliosis.    FUNCTIONAL TESTS:  5 times sit to stand: 30.86 Timed up and go (TUG): 29.39 2 minute walk test: 265ft  10 meter walk test: 0.62m/s, SOB 4-5.  Berg Balance Scale: TBD  PATIENT SURVEYS:  FOTO 56. goal 64  TODAY'S TREATMENT:                                                                                                                              DATE:  01/16/24  TA- To improve functional movements patterns for everyday tasks Ambulate with speed ladder; one foot per square x 4 length of // bars  Step over consecutive hurdles x 4 lengths of //  bars    NMR: To facilitate reeducation of movement, balance, posture, coordination, and/or proprioception/kinesthetic sense. Standing with CGA next to support surface:  Airex pad: static stand 30 seconds x 2 trials, noticeable trembling of ankles/LE's with fatigue and challenge to maintain stability Airex pad: horizontal head turns 30 seconds scanning room 10x ; cueing for arc of motion  Airex pad: vertical head turns 30 seconds, cueing for arc of motion, noticeable sway with upward gaze increasing demand on ankle righting reaction musculature Airex pad: one foot on 6" step one foot on airex pad, hold position for 30 seconds, switch legs, 2x each LE; Airex pad: 4" step toe taps 10x each LE   Cues for improved posture and step length as able. Reports mild low back pain following step ascent.    PATIENT EDUCATION: Education details: Pt educated throughout session about proper posture and technique with exercises. Improved exercise technique, movement at target joints, use of target muscles after min to mod verbal, visual, tactile cues.  Person educated: Patient Education method: Explanation Education comprehension: verbalized understanding and returned demonstration  HOME EXERCISE PROGRAM:  Access Code: [email or web address] URL: [email or web address] Date: 09/25/2023 Prepared by: Grier Rocher  Exercises - Seated Hip Abduction with Resistance  - 1 x daily - 4 x weekly - 3 sets - 15 reps - 3 hold - Seated March with Resistance  - 1 x daily - 4 x weekly - 3 sets - 10 reps - 1 hold - Seated Long Arc Quad  - 1 x daily - 4 x weekly - 3 sets - 12 reps - 3 hold - Seated Hip Adduction Isometrics with Ball  - 1 x daily - 4 x weekly - 3 sets - 12 reps - 3 hold - Standing March with Counter Support  - 1 x daily - 3 x weekly - 2 sets - 10 reps - Standing Hip Abduction with Counter Support  - 1 x daily - 3 x weekly - 2 sets - 10 reps - Standing Hip Extension with Counter Support  - 1 x daily - 3  x weekly - 2 sets - 10 reps - Mini Squat with Counter Support  - 1 x daily - 3 x weekly - 2 sets - 10 reps - Standing Knee Flexion with Counter Support  - 1 x daily - 3 x weekly - 2 sets - 10 reps - Standing Tandem Balance with Counter Support  - 1 x daily - 3 x weekly - 2 sets - 4 reps - 10 hold  GOALS: Goals reviewed with patient? Yes   SHORT TERM GOALS: Target date: 12/28/23    Patient will be independent in home exercise program to improve strength/mobility for better functional independence with ADLs. Baseline:to be given at next session; 10/23/2023- Patient reports independence with HEP provided and no significant issues.   Goal status: MET 2. Pt will be able to ambulate for at least 5 min without stopping with LRAD to allow improved access to community and return to PLOF  Baseline: 3 minutes. 2/11: 6 minute walk test completed.   Goal Status: MET    LONG TERM GOALS: Target date: 01/31/2024    Patient will increase FOTO score to equal to or greater than  61   to demonstrate statistically significant improvement in mobility and quality of life.  Baseline: 56; 10/23/2023= 60 Goal status: PROGRESSING  2.  Patient (> 30 years old) will complete five times sit to stand test in < 15 seconds  without UE support indicating an increased LE strength and improved balance. Baseline: 30.86 sec; 10/23/2023= 22.08 using 1 UE support.  12/11: 14.67 sec BUE from arm rest. And 14.12 with 1 UE pushing from arm rest. Goal status: MET/ Adjusted   3.  Patient will increase Berg Balance score to >45 points to demonstrate decreased fall risk during functional activities  Baseline: 26; 10/23/2023= 30 12/11: 34 Goal status: MET/ Adjusted   4.  Patient will increase 10 meter walk test to >1.69m/s as to improve gait speed for better community ambulation and to reduce fall risk. Baseline: 0.637m/s; 10/23/2023= 0.88 m/s (normal speed with 4WW) 12/11: Average Fast speed: 1.05 m/s Average Normal speed:  0.834 m/s Goal status: PROGRESSING  5.  Patient will reduce timed up and go to <11 seconds to reduce fall risk and demonstrate improved transfer/gait ability. Baseline: 29.39 12/2: 16.4 sec with Rollator  12/11: 15.19 sec with rollator  2/4: 17.38 sec with rollator   Goal status: IN PROGRESS  6.  Patient will 2 min walk test by at least 70ft as to demonstrate reduced fall risk and improved dynamic  gait balance for better safety with community/home ambulation.   Baseline: 277ft; 10/23/2023= 360 feet  with 4WW 347ft with 4WW Goal status: MET  7. Pt will complete 6 min walk test without stopping and improve overall score by at least 189ft to indicate increased access to community.    Baseline: to be complete 2/11:880 ft with RW Goal: INITIAL    ASSESSMENT: CLINICAL IMPRESSION:  Step negotiation as well as stability on unstable surfaces tolerated well. Patient highly motivated throughout session. She does require use of heat on back between standing interventions to reduce back pain.  Pt will continue to benefit from skilled physical therapy intervention to address impairments, improve QOL, and attain therapy goals.       OBJECTIVE IMPAIRMENTS: Abnormal gait, cardiopulmonary status limiting activity, decreased activity tolerance, decreased balance, decreased endurance, decreased mobility, difficulty walking, decreased ROM, decreased strength, decreased safety awareness, hypomobility, increased fascial restrictions, impaired flexibility, improper body mechanics, and postural dysfunction.   ACTIVITY LIMITATIONS: lifting, standing, squatting, transfers, and locomotion level  PARTICIPATION LIMITATIONS: laundry, shopping, and community activity  PERSONAL FACTORS: 3+ comorbidities: scoliosis, hx of CA, and PE  are also affecting patient's functional outcome.   REHAB POTENTIAL: Good  CLINICAL DECISION MAKING: Stable/uncomplicated  EVALUATION COMPLEXITY: Moderate  PLAN:  PT  FREQUENCY: 1-2x/week  PT DURATION: 12 weeks  PLANNED INTERVENTIONS: Therapeutic exercises, Therapeutic activity, Neuromuscular re-education, Balance training, Gait training, Patient/Family education, Self Care, Joint mobilization, Stair training, Vestibular training, DME instructions, Dry Needling, Spinal mobilization, Cryotherapy, Moist heat, and Manual therapy  PLAN FOR NEXT SESSION:   Continue Dynamic balance training. BLE and postural strengthening.    Precious Bard, PT 01/16/2024, 2:44 PM

## 2024-01-16 ENCOUNTER — Ambulatory Visit: Payer: Medicare Other

## 2024-01-16 DIAGNOSIS — M6281 Muscle weakness (generalized): Secondary | ICD-10-CM | POA: Diagnosis not present

## 2024-01-16 DIAGNOSIS — R262 Difficulty in walking, not elsewhere classified: Secondary | ICD-10-CM

## 2024-01-16 DIAGNOSIS — R2681 Unsteadiness on feet: Secondary | ICD-10-CM

## 2024-01-16 DIAGNOSIS — R2689 Other abnormalities of gait and mobility: Secondary | ICD-10-CM

## 2024-01-18 ENCOUNTER — Ambulatory Visit: Payer: Medicare Other

## 2024-01-24 ENCOUNTER — Ambulatory Visit: Payer: Medicare Other | Admitting: Physical Therapy

## 2024-01-24 DIAGNOSIS — M6281 Muscle weakness (generalized): Secondary | ICD-10-CM | POA: Diagnosis not present

## 2024-01-24 DIAGNOSIS — R2681 Unsteadiness on feet: Secondary | ICD-10-CM

## 2024-01-24 DIAGNOSIS — R2689 Other abnormalities of gait and mobility: Secondary | ICD-10-CM

## 2024-01-24 DIAGNOSIS — R262 Difficulty in walking, not elsewhere classified: Secondary | ICD-10-CM

## 2024-01-24 NOTE — Therapy (Deleted)
 OUTPATIENT PHYSICAL THERAPY NEURO TREATMENT NOTE    Patient Name: Kristina Rowe MRN: [id number] DOB:Mar 10, 1930, 89 y.o., female Today's Date: 01/24/2024   PCP: Enid Baas, MD  REFERRING PROVIDER:   Ignacia Bayley, PA-C    END OF SESSION:           Past Medical History:  Diagnosis Date   Ductal carcinoma in situ (DCIS) of left breast    Dysphagia    Esophageal stricture    Gastroparesis    GERD (gastroesophageal reflux disease)    Hiatal hernia    Hypertension    Iron deficiency anemia    Osteoporosis    Pulmonary embolism (HCC)    Scoliosis    UTI (urinary tract infection)    Past Surgical History:  Procedure Laterality Date   BREAST LUMPECTOMY Left 2009   Ductal Carcinoma insitu    CATARACT EXTRACTION, BILATERAL     COLONOSCOPY N/A 03/17/2021   Procedure: COLONOSCOPY;  Surgeon: Regis Bill, MD;  Location: [facility];  Service: Endoscopy;  Laterality: N/A;   COLOSTOMY REVISION Right 03/18/2021   Procedure: COLON RESECTION RIGHT;  Surgeon: Henrene Dodge, MD;  Location: [facility];  Service: General;  Laterality: Right;   LAPAROSCOPIC PARAESOPHAGEAL HERNIA REPAIR  2009   LAPAROTOMY N/A 03/10/2021   Procedure: EXPLORATORY LAPAROTOMY;  Surgeon: Henrene Dodge, MD;  Location: [facility];  Service: General;  Laterality: N/A;   TUBAL LIGATION     Patient Active Problem List   Diagnosis Date Noted   Acute on chronic diastolic CHF (congestive heart failure) (HCC) 08/19/2023   Acute respiratory failure with hypoxia (HCC) 08/17/2023   History of pulmonary embolus (PE) 08/17/2023   Basal cell carcinoma of leg 04/01/2021   Coronary atherosclerosis 04/01/2021   History of pulmonary embolism 04/01/2021   Pulmonary nodule 04/01/2021   Malignant neoplasm of colon (HCC) 03/29/2021   Rectal bleeding 03/15/2021   Hypertension    GERD (gastroesophageal reflux disease)    Colonic mass    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HCC) 03/10/2021   Anemia, unspecified  03/06/2021   Bronchiectasis without complication (HCC) 03/27/2020   Iron deficiency anemia 03/27/2020   Moderate aortic stenosis 03/27/2020   Stage 3a chronic kidney disease (HCC) 07/04/2019   Hypnic headache 05/28/2019   Telogen effluvium 04/17/2019   Xerosis cutis 04/17/2019   Essential hypertension 01/14/2019   Cervical radiculopathy 05/04/2018   Osteoarthritis of left glenohumeral joint 05/04/2018   Vitamin B12 deficiency 01/01/2018   Vascular abnormality 09/11/2017   Dyspepsia 03/28/2017   Pulmonary embolism (HCC) 08/16/2016   Dilated pore of [place] 03/23/2015   Retinal drusen of both eyes 08/28/2014   Status post right cataract extraction 07/30/2014   Verruca 03/19/2014   Chronic back pain 11/06/2013   Postmenopausal osteoporosis 06/11/2013   Anticoagulated on Coumadin 04/06/2013   Long term (current) use of anticoagulants 10/18/2012   Preglaucoma 05/10/2012   Presbyopia 05/10/2012   Senile nuclear sclerosis 05/10/2012   Lobular carcinoma in situ of breast 04/13/2012   Closed fracture of ankle 02/27/2012   History of nonmelanoma skin cancer 09/05/2011   Other benign neoplasm of skin of trunk 09/05/2011   Osteoporosis 08/25/2011   Transient alteration of awareness 09/03/2009   Colon adenoma 07/30/2002    ONSET DATE: 3 months   REFERRING DIAG:  Diagnosis  R53.83 (ICD-10-CM) - Other fatigue    THERAPY DIAG:  Muscle weakness (generalized)  Difficulty in walking, not elsewhere classified  Unsteadiness on feet  Other abnormalities of gait and mobility  Rationale for  Evaluation and Treatment: Rehabilitation  SUBJECTIVE:                                                                                                                                                                                             SUBJECTIVE STATEMENT:  Patient reports her back is really bothering her.   Pt accompanied by: self  PERTINENT HISTORY:  From recent MD visit.  89 y.o. here  for an acute issue. She has a PMH of chronic PE/Coumadin, anemia, colon cancer, breast cancer, large hiatal hernia, moderate aortic stenosis who has concerns about shortness of breath, nausea, generalized weakness. No chest pain. History of hiatal hernia and recent CT chest. Also recent endoscopy. Takes Zofran chronically. Has felt weaker and almost fell recently. Uses a walker. Also on chronic UTI suppression, Keflex.   PAIN:  Are you having pain? Yes: NPRS scale: 5/10 Pain location: low back  Pain description: sore  Aggravating factors: movement  Relieving factors: rest   PRECAUTIONS: Fall  RED FLAGS: None   WEIGHT BEARING RESTRICTIONS: No  FALLS: Has patient fallen in last 6 months? No  LIVING ENVIRONMENT: Lives with: lives alone Lives in: House/apartment Stairs: Yes: External: 1 steps; none Has following equipment at home: Walker - 4 wheeled  PLOF: Independent with household mobility with device and Independent with community mobility with device  PATIENT GOALS: improved strength in BLE   OBJECTIVE:   DIAGNOSTIC FINDINGS:  06/12/2023 EXAM: CT CHEST, ABDOMEN, AND PELVIS WITH CONTRAST IMPRESSION: 1. Status post right hemicolectomy, without recurrent or metastatic disease. Decreased sensitivity exam, primarily involving the abdomen pelvis, due to paucity of fat. 2. New tiny left groin hernia containing fluid. Consider physical exam correlation. 3. Incidental findings, including: Coronary artery atherosclerosis. Aortic Atherosclerosis (ICD10-I70.0). Dilated esophagus with contrast throughout, suggesting dysmotility and/or gastroesophageal reflux.  COGNITION: Overall cognitive status: Within functional limits for tasks assessed   SENSATION: Light touch: Impaired  mild paraesthesia in the proximal RLE   COORDINATION: WFL. Ankle to knee limited ROM due to weakness in the R hip      POSTURE: rounded shoulders, forward head, flexed trunk , and severe scoliosis      LOWER EXTREMITY MMT:    MMT Right Eval Left Eval  Hip flexion 4- 4  Hip extension    Hip abduction 4- 44  Hip adduction 4 4  Hip internal rotation    Hip external rotation    Knee flexion 4- 4  Knee extension 4 4+  Ankle dorsiflexion 4 4+  Ankle plantarflexion    Ankle inversion    Ankle eversion    (Blank rows = not  tested)    TRANSFERS: Assistive device utilized: Environmental consultant - 4 wheeled  Sit to stand: SBA Stand to sit: SBA Chair to chair: SBA Floor:  Unable to perform    STAIRS: Level of Assistance:  pt declines Stair Negotiation Technique: Step to Pattern with Bilateral Rails Number of Stairs: 4  Height of Stairs: 6  Comments: will need to assess.   GAIT: Gait pattern: step through pattern, Right foot flat, and lateral hip instability Distance walked: 224ft Assistive device utilized: Walker - 4 wheeled Level of assistance: Modified independence and SBA Comments: severe scoliosis.    FUNCTIONAL TESTS:  5 times sit to stand: 30.86 Timed up and go (TUG): 29.39 2 minute walk test: 252ft  10 meter walk test: 0.699m/s, SOB 4-5.  Berg Balance Scale: TBD  PATIENT SURVEYS:  FOTO 56. goal 64  TODAY'S TREATMENT:                                                                                                                              DATE:  01/24/24  TA- To improve functional movements patterns for everyday tasks Ambulate with speed ladder; one foot per square x 4 length of // bars  Step over consecutive hurdles x 4 lengths of // bars    NMR: To facilitate reeducation of movement, balance, posture, coordination, and/or proprioception/kinesthetic sense. Standing with CGA next to support surface:  Airex pad: static stand 30 seconds x 2 trials, noticeable trembling of ankles/LE's with fatigue and challenge to maintain stability Airex pad: horizontal head turns 30 seconds scanning room 10x ; cueing for arc of motion  Airex pad: vertical head turns 30  seconds, cueing for arc of motion, noticeable sway with upward gaze increasing demand on ankle righting reaction musculature Airex pad: one foot on 6" step one foot on airex pad, hold position for 30 seconds, switch legs, 2x each LE; Airex pad: 4" step toe taps 10x each LE   Cues for improved posture and step length as able. Reports mild low back pain following step ascent.    PATIENT EDUCATION: Education details: Pt educated throughout session about proper posture and technique with exercises. Improved exercise technique, movement at target joints, use of target muscles after min to mod verbal, visual, tactile cues.  Person educated: Patient Education method: Explanation Education comprehension: verbalized understanding and returned demonstration  HOME EXERCISE PROGRAM: Access Code: [email or web address] URL: [email or web address] Date: 09/25/2023 Prepared by: Grier Rocher  Exercises - Seated Hip Abduction with Resistance  - 1 x daily - 4 x weekly - 3 sets - 15 reps - 3 hold - Seated March with Resistance  - 1 x daily - 4 x weekly - 3 sets - 10 reps - 1 hold - Seated Long Arc Quad  - 1 x daily - 4 x weekly - 3 sets - 12 reps - 3 hold - Seated Hip Adduction Isometrics with Ball  - 1 x daily - 4 x weekly - 3  sets - 12 reps - 3 hold - Standing March with Counter Support  - 1 x daily - 3 x weekly - 2 sets - 10 reps - Standing Hip Abduction with Counter Support  - 1 x daily - 3 x weekly - 2 sets - 10 reps - Standing Hip Extension with Counter Support  - 1 x daily - 3 x weekly - 2 sets - 10 reps - Mini Squat with Counter Support  - 1 x daily - 3 x weekly - 2 sets - 10 reps - Standing Knee Flexion with Counter Support  - 1 x daily - 3 x weekly - 2 sets - 10 reps - Standing Tandem Balance with Counter Support  - 1 x daily - 3 x weekly - 2 sets - 4 reps - 10 hold  GOALS: Goals reviewed with patient? Yes   SHORT TERM GOALS: Target date: 12/28/23    Patient will be independent in home  exercise program to improve strength/mobility for better functional independence with ADLs. Baseline:to be given at next session; 10/23/2023- Patient reports independence with HEP provided and no significant issues.   Goal status: MET 2. Pt will be able to ambulate for at least 5 min without stopping with LRAD to allow improved access to community and return to PLOF  Baseline: 3 minutes. 2/11: 6 minute walk test completed.   Goal Status: MET    LONG TERM GOALS: Target date: 01/31/2024    Patient will increase FOTO score to equal to or greater than  61   to demonstrate statistically significant improvement in mobility and quality of life.  Baseline: 56; 10/23/2023= 60 Goal status: PROGRESSING  2.  Patient (> 34 years old) will complete five times sit to stand test in < 15 seconds  without UE support indicating an increased LE strength and improved balance. Baseline: 30.86 sec; 10/23/2023= 22.08 using 1 UE support.  12/11: 14.67 sec BUE from arm rest. And 14.12 with 1 UE pushing from arm rest. Goal status: MET/ Adjusted   3.  Patient will increase Berg Balance score to >45 points to demonstrate decreased fall risk during functional activities  Baseline: 26; 10/23/2023= 30 12/11: 34 Goal status: MET/ Adjusted   4.  Patient will increase 10 meter walk test to >1.34m/s as to improve gait speed for better community ambulation and to reduce fall risk. Baseline: 0.670m/s; 10/23/2023= 0.88 m/s (normal speed with 4WW) 12/11: Average Fast speed: 1.05 m/s Average Normal speed: 0.834 m/s Goal status: PROGRESSING  5.  Patient will reduce timed up and go to <11 seconds to reduce fall risk and demonstrate improved transfer/gait ability. Baseline: 29.39 12/2: 16.4 sec with Rollator  12/11: 15.19 sec with rollator  2/4: 17.38 sec with rollator   Goal status: IN PROGRESS  6.  Patient will 2 min walk test by at least 91ft as to demonstrate reduced fall risk and improved dynamic gait balance for  better safety with community/home ambulation.   Baseline: 244ft; 10/23/2023= 360 feet  with 4WW 350ft with 4WW Goal status: MET  7. Pt will complete 6 min walk test without stopping and improve overall score by at least 174ft to indicate increased access to community.    Baseline: to be complete 2/11:880 ft with RW Goal: INITIAL    ASSESSMENT: CLINICAL IMPRESSION:  Step negotiation as well as stability on unstable surfaces tolerated well. Patient highly motivated throughout session. She does require use of heat on back between standing interventions to reduce back pain.  Pt will continue to benefit from skilled physical therapy intervention to address impairments, improve QOL, and attain therapy goals.       OBJECTIVE IMPAIRMENTS: Abnormal gait, cardiopulmonary status limiting activity, decreased activity tolerance, decreased balance, decreased endurance, decreased mobility, difficulty walking, decreased ROM, decreased strength, decreased safety awareness, hypomobility, increased fascial restrictions, impaired flexibility, improper body mechanics, and postural dysfunction.   ACTIVITY LIMITATIONS: lifting, standing, squatting, transfers, and locomotion level  PARTICIPATION LIMITATIONS: laundry, shopping, and community activity  PERSONAL FACTORS: 3+ comorbidities: scoliosis, hx of CA, and PE  are also affecting patient's functional outcome.   REHAB POTENTIAL: Good  CLINICAL DECISION MAKING: Stable/uncomplicated  EVALUATION COMPLEXITY: Moderate  PLAN:  PT FREQUENCY: 1-2x/week  PT DURATION: 12 weeks  PLANNED INTERVENTIONS: Therapeutic exercises, Therapeutic activity, Neuromuscular re-education, Balance training, Gait training, Patient/Family education, Self Care, Joint mobilization, Stair training, Vestibular training, DME instructions, Dry Needling, Spinal mobilization, Cryotherapy, Moist heat, and Manual therapy  PLAN FOR NEXT SESSION:   Continue Dynamic balance training. BLE  and postural strengthening.    Norman Herrlich, PT 01/24/2024, 1:00 PM

## 2024-01-24 NOTE — Therapy (Signed)
 OUTPATIENT PHYSICAL THERAPY NEURO TREATMENT NOTE    Patient Name: Kristina Rowe MRN: [id number] DOB:10-26-1930, 89 y.o., female Today's Date: 01/24/2024   PCP: Enid Baas, MD  REFERRING PROVIDER:   Ignacia Bayley, PA-C    END OF SESSION:   PT End of Session - 01/24/24 1501     Visit Number 23    Number of Visits 38    Date for PT Re-Evaluation 01/31/24    Progress Note Due on Visit 30    PT Start Time 1404    PT Stop Time 1445    PT Time Calculation (min) 41 min    Equipment Utilized During Treatment Gait belt    Activity Tolerance Patient tolerated treatment well;No increased pain    Behavior During Therapy WFL for tasks assessed/performed             Past Medical History:  Diagnosis Date   Ductal carcinoma in situ (DCIS) of left breast    Dysphagia    Esophageal stricture    Gastroparesis    GERD (gastroesophageal reflux disease)    Hiatal hernia    Hypertension    Iron deficiency anemia    Osteoporosis    Pulmonary embolism (HCC)    Scoliosis    UTI (urinary tract infection)    Past Surgical History:  Procedure Laterality Date   BREAST LUMPECTOMY Left 2009   Ductal Carcinoma insitu    CATARACT EXTRACTION, BILATERAL     COLONOSCOPY N/A 03/17/2021   Procedure: COLONOSCOPY;  Surgeon: Regis Bill, MD;  Location: [facility];  Service: Endoscopy;  Laterality: N/A;   COLOSTOMY REVISION Right 03/18/2021   Procedure: COLON RESECTION RIGHT;  Surgeon: Henrene Dodge, MD;  Location: [facility];  Service: General;  Laterality: Right;   LAPAROSCOPIC PARAESOPHAGEAL HERNIA REPAIR  2009   LAPAROTOMY N/A 03/10/2021   Procedure: EXPLORATORY LAPAROTOMY;  Surgeon: Henrene Dodge, MD;  Location: [facility];  Service: General;  Laterality: N/A;   TUBAL LIGATION     Patient Active Problem List   Diagnosis Date Noted   Acute on chronic diastolic CHF (congestive heart failure) (HCC) 08/19/2023   Acute respiratory failure with hypoxia (HCC) 08/17/2023   History  of pulmonary embolus (PE) 08/17/2023   Basal cell carcinoma of leg 04/01/2021   Coronary atherosclerosis 04/01/2021   History of pulmonary embolism 04/01/2021   Pulmonary nodule 04/01/2021   Malignant neoplasm of colon (HCC) 03/29/2021   Rectal bleeding 03/15/2021   Hypertension    GERD (gastroesophageal reflux disease)    Colonic mass    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HCC) 03/10/2021   Anemia, unspecified 03/06/2021   Bronchiectasis without complication (HCC) 03/27/2020   Iron deficiency anemia 03/27/2020   Moderate aortic stenosis 03/27/2020   Stage 3a chronic kidney disease (HCC) 07/04/2019   Hypnic headache 05/28/2019   Telogen effluvium 04/17/2019   Xerosis cutis 04/17/2019   Essential hypertension 01/14/2019   Cervical radiculopathy 05/04/2018   Osteoarthritis of left glenohumeral joint 05/04/2018   Vitamin B12 deficiency 01/01/2018   Vascular abnormality 09/11/2017   Dyspepsia 03/28/2017   Pulmonary embolism (HCC) 08/16/2016   Dilated pore of [place] 03/23/2015   Retinal drusen of both eyes 08/28/2014   Status post right cataract extraction 07/30/2014   Verruca 03/19/2014   Chronic back pain 11/06/2013   Postmenopausal osteoporosis 06/11/2013   Anticoagulated on Coumadin 04/06/2013   Long term (current) use of anticoagulants 10/18/2012   Preglaucoma 05/10/2012   Presbyopia 05/10/2012   Senile nuclear sclerosis 05/10/2012   Lobular carcinoma  in situ of breast 04/13/2012   Closed fracture of ankle 02/27/2012   History of nonmelanoma skin cancer 09/05/2011   Other benign neoplasm of skin of trunk 09/05/2011   Osteoporosis 08/25/2011   Transient alteration of awareness 09/03/2009   Colon adenoma 07/30/2002    ONSET DATE: 3 months   REFERRING DIAG:  Diagnosis  R53.83 (ICD-10-CM) - Other fatigue    THERAPY DIAG:  Muscle weakness (generalized)  Difficulty in walking, not elsewhere classified  Unsteadiness on feet  Other abnormalities of gait and mobility  Rationale  for Evaluation and Treatment: Rehabilitation  SUBJECTIVE:                                                                                                                                                                                             SUBJECTIVE STATEMENT:   Pt reports she has had a "hectic" morning having to wake up early to go get lab work. Pt reports "my back always hurts." But no additional pain description provided and it does not limit her participation.  Pt accompanied by: self  PERTINENT HISTORY:  From recent MD visit.  89 y.o. here for an acute issue. She has a PMH of chronic PE/Coumadin, anemia, colon cancer, breast cancer, large hiatal hernia, moderate aortic stenosis who has concerns about shortness of breath, nausea, generalized weakness. No chest pain. History of hiatal hernia and recent CT chest. Also recent endoscopy. Takes Zofran chronically. Has felt weaker and almost fell recently. Uses a walker. Also on chronic UTI suppression, Keflex.   PAIN:  Are you having pain? Yes: NPRS scale: 5/10 Pain location: low back  Pain description: sore  Aggravating factors: movement  Relieving factors: rest   PRECAUTIONS: Fall  RED FLAGS: None   WEIGHT BEARING RESTRICTIONS: No  FALLS: Has patient fallen in last 6 months? No  LIVING ENVIRONMENT: Lives with: lives alone Lives in: House/apartment Stairs: Yes: External: 1 steps; none Has following equipment at home: Walker - 4 wheeled  PLOF: Independent with household mobility with device and Independent with community mobility with device  PATIENT GOALS: improved strength in BLE   OBJECTIVE:   DIAGNOSTIC FINDINGS:  06/12/2023 EXAM: CT CHEST, ABDOMEN, AND PELVIS WITH CONTRAST IMPRESSION: 1. Status post right hemicolectomy, without recurrent or metastatic disease. Decreased sensitivity exam, primarily involving the abdomen pelvis, due to paucity of fat. 2. New tiny left groin hernia containing fluid. Consider  physical exam correlation. 3. Incidental findings, including: Coronary artery atherosclerosis. Aortic Atherosclerosis (ICD10-I70.0). Dilated esophagus with contrast throughout, suggesting dysmotility and/or gastroesophageal reflux.  COGNITION: Overall cognitive status: Within functional limits for tasks assessed   SENSATION:  Light touch: Impaired  mild paraesthesia in the proximal RLE   COORDINATION: WFL. Ankle to knee limited ROM due to weakness in the R hip      POSTURE: rounded shoulders, forward head, flexed trunk , and severe scoliosis     LOWER EXTREMITY MMT:    MMT Right Eval Left Eval  Hip flexion 4- 4  Hip extension    Hip abduction 4- 44  Hip adduction 4 4  Hip internal rotation    Hip external rotation    Knee flexion 4- 4  Knee extension 4 4+  Ankle dorsiflexion 4 4+  Ankle plantarflexion    Ankle inversion    Ankle eversion    (Blank rows = not tested)    TRANSFERS: Assistive device utilized: Environmental consultant - 4 wheeled  Sit to stand: SBA Stand to sit: SBA Chair to chair: SBA Floor:  Unable to perform    STAIRS: Level of Assistance:  pt declines Stair Negotiation Technique: Step to Pattern with Bilateral Rails Number of Stairs: 4  Height of Stairs: 6  Comments: will need to assess.   GAIT: Gait pattern: step through pattern, Right foot flat, and lateral hip instability Distance walked: 232ft Assistive device utilized: Walker - 4 wheeled Level of assistance: Modified independence and SBA Comments: severe scoliosis.    FUNCTIONAL TESTS:  5 times sit to stand: 30.86 Timed up and go (TUG): 29.39 2 minute walk test: 215ft  10 meter walk test: 0.688m/s, SOB 4-5.  Berg Balance Scale: TBD  PATIENT SURVEYS:  FOTO 56. goal 64  TODAY'S TREATMENT:                                                                                                                              DATE:  01/24/24   Gait in/out of clinic using rollator mod-I. Pt reports  she doesn't use her rollator in the kitchen because she always has a surface to hold onto.  Unless otherwise stated, CGA was provided and gait belt donned in order to ensure pt safety throughout.  Dynamic gait training ~118ft, no AD, with CGA for steadying - demos reciprocal stepping pattern, wider BOS, lacks terminal knee extension bilaterally (potential tightness in hamstrings?), and is associated with landing on midfoot rather than heel strike.   Dynamic balance and B LE strengthening including:  - forward/backwards stepping on/off green step - started with 1 UE support on balance bar progressed to no UE support x10reps each LE * requires light min A for balance - greatest challenge bringing L foot back off step  Dynamic gait training using agility ladder including the following:  - forward step-to progressed to reciprocal pattern down/back x2 each with light min A for balance - cuing for heel strike on initial contact with longer step lengths and more narrow BOS - side stepping down/back x1 each with light min A for balance - continues to have greatest difficulty with stepping towards R, keeping R weight  shift to pick up L LE and bring it over  Of note: Pt does require frequent seated therapeutic rest breaks throughout session due to impaired activity tolerance.  Gait training ~147ft again, no AD, as described above but with slight improvement in BOS and heel strike on initial contact.    PATIENT EDUCATION: Education details: Pt educated throughout session about proper posture and technique with exercises. Improved exercise technique, movement at target joints, use of target muscles after min to mod verbal, visual, tactile cues.  Person educated: Patient Education method: Explanation Education comprehension: verbalized understanding and returned demonstration  HOME EXERCISE PROGRAM: Access Code: [email or web address] URL: [email or web address] Date: 09/25/2023 Prepared by: Grier Rocher  Exercises - Seated Hip Abduction with Resistance  - 1 x daily - 4 x weekly - 3 sets - 15 reps - 3 hold - Seated March with Resistance  - 1 x daily - 4 x weekly - 3 sets - 10 reps - 1 hold - Seated Long Arc Quad  - 1 x daily - 4 x weekly - 3 sets - 12 reps - 3 hold - Seated Hip Adduction Isometrics with Ball  - 1 x daily - 4 x weekly - 3 sets - 12 reps - 3 hold - Standing March with Counter Support  - 1 x daily - 3 x weekly - 2 sets - 10 reps - Standing Hip Abduction with Counter Support  - 1 x daily - 3 x weekly - 2 sets - 10 reps - Standing Hip Extension with Counter Support  - 1 x daily - 3 x weekly - 2 sets - 10 reps - Mini Squat with Counter Support  - 1 x daily - 3 x weekly - 2 sets - 10 reps - Standing Knee Flexion with Counter Support  - 1 x daily - 3 x weekly - 2 sets - 10 reps - Standing Tandem Balance with Counter Support  - 1 x daily - 3 x weekly - 2 sets - 4 reps - 10 hold  GOALS: Goals reviewed with patient? Yes   SHORT TERM GOALS: Target date: 12/28/23    Patient will be independent in home exercise program to improve strength/mobility for better functional independence with ADLs. Baseline:to be given at next session; 10/23/2023- Patient reports independence with HEP provided and no significant issues.   Goal status: MET 2. Pt will be able to ambulate for at least 5 min without stopping with LRAD to allow improved access to community and return to PLOF  Baseline: 3 minutes. 2/11: 6 minute walk test completed.   Goal Status: MET    LONG TERM GOALS: Target date: 01/31/2024    Patient will increase FOTO score to equal to or greater than  61   to demonstrate statistically significant improvement in mobility and quality of life.  Baseline: 56; 10/23/2023= 60 Goal status: PROGRESSING  2.  Patient (> 108 years old) will complete five times sit to stand test in < 15 seconds  without UE support indicating an increased LE strength and improved balance. Baseline: 30.86  sec; 10/23/2023= 22.08 using 1 UE support.  12/11: 14.67 sec BUE from arm rest. And 14.12 with 1 UE pushing from arm rest. Goal status: MET/ Adjusted   3.  Patient will increase Berg Balance score to >45 points to demonstrate decreased fall risk during functional activities  Baseline: 26; 10/23/2023= 30 12/11: 34 Goal status: MET/ Adjusted   4.  Patient will increase 10 meter walk test to >1.5m/s as  to improve gait speed for better community ambulation and to reduce fall risk. Baseline: 0.626m/s; 10/23/2023= 0.88 m/s (normal speed with 4WW) 12/11: Average Fast speed: 1.05 m/s Average Normal speed: 0.834 m/s Goal status: PROGRESSING  5.  Patient will reduce timed up and go to <11 seconds to reduce fall risk and demonstrate improved transfer/gait ability. Baseline: 29.39 12/2: 16.4 sec with Rollator  12/11: 15.19 sec with rollator  2/4: 17.38 sec with rollator   Goal status: IN PROGRESS  6.  Patient will 2 min walk test by at least 35ft as to demonstrate reduced fall risk and improved dynamic gait balance for better safety with community/home ambulation.   Baseline: 234ft; 10/23/2023= 360 feet  with 4WW 357ft with 4WW Goal status: MET  7. Pt will complete 6 min walk test without stopping and improve overall score by at least 164ft to indicate increased access to community.    Baseline: to be complete 2/11:880 ft with RW Goal: INITIAL    ASSESSMENT: CLINICAL IMPRESSION:  Ms. Twardowski is able to progress to higher level dynamic balance and dynamic gait challenges outside of the // bars without UE support today. Performs forward reciprocal and side stepping patterns through agility ladder with greatest challenge stepping towards R with delayed ability to bring L foot over due to lack of sustained R weight shift. Pt also performed higher level dynamic stepping balance with B LE functional strength training of stepping on/off 4" green step without UE support requiring primarily  CGA/occasional min A for balance. She continues to be highly motivated to participate and is an excellent candidate to continue participating in skilled physical therapy to decrease her risk for falls and maintain her independence. Pt will continue to benefit from skilled physical therapy intervention to address impairments, improve QOL, and attain therapy goals.       OBJECTIVE IMPAIRMENTS: Abnormal gait, cardiopulmonary status limiting activity, decreased activity tolerance, decreased balance, decreased endurance, decreased mobility, difficulty walking, decreased ROM, decreased strength, decreased safety awareness, hypomobility, increased fascial restrictions, impaired flexibility, improper body mechanics, and postural dysfunction.   ACTIVITY LIMITATIONS: lifting, standing, squatting, transfers, and locomotion level  PARTICIPATION LIMITATIONS: laundry, shopping, and community activity  PERSONAL FACTORS: 3+ comorbidities: scoliosis, hx of CA, and PE  are also affecting patient's functional outcome.   REHAB POTENTIAL: Good  CLINICAL DECISION MAKING: Stable/uncomplicated  EVALUATION COMPLEXITY: Moderate  PLAN:  PT FREQUENCY: 1-2x/week  PT DURATION: 12 weeks  PLANNED INTERVENTIONS: Therapeutic exercises, Therapeutic activity, Neuromuscular re-education, Balance training, Gait training, Patient/Family education, Self Care, Joint mobilization, Stair training, Vestibular training, DME instructions, Dry Needling, Spinal mobilization, Cryotherapy, Moist heat, and Manual therapy  PLAN FOR NEXT SESSION: Continue Dynamic balance and gait training. Progressing to higher level variable direction stepping and reaching tasks - forced R weight bearing and lifting L LE. BLE and postural strengthening.    Casimiro Needle, PT, DPT, NCS, CSRS Physical Therapist - [place] [facility]  [facility]  3:02 PM 01/24/24

## 2024-01-29 ENCOUNTER — Ambulatory Visit: Payer: Medicare Other

## 2024-01-30 ENCOUNTER — Ambulatory Visit: Payer: Medicare Other | Attending: Physician Assistant

## 2024-01-30 DIAGNOSIS — M5459 Other low back pain: Secondary | ICD-10-CM | POA: Diagnosis present

## 2024-01-30 DIAGNOSIS — R2689 Other abnormalities of gait and mobility: Secondary | ICD-10-CM | POA: Insufficient documentation

## 2024-01-30 DIAGNOSIS — R278 Other lack of coordination: Secondary | ICD-10-CM | POA: Insufficient documentation

## 2024-01-30 DIAGNOSIS — M6281 Muscle weakness (generalized): Secondary | ICD-10-CM | POA: Diagnosis present

## 2024-01-30 DIAGNOSIS — R262 Difficulty in walking, not elsewhere classified: Secondary | ICD-10-CM | POA: Diagnosis present

## 2024-01-30 DIAGNOSIS — R2681 Unsteadiness on feet: Secondary | ICD-10-CM | POA: Diagnosis present

## 2024-01-30 NOTE — Therapy (Signed)
 OUTPATIENT PHYSICAL THERAPY NEURO TREATMENT NOTE/RECERT    Patient Name: Kristina Rowe MRN: [id number] DOB:February 12, 1930, 89 y.o., female Today's Date: 01/30/2024   PCP: Enid Baas, MD  REFERRING PROVIDER:   Ignacia Bayley, PA-C    END OF SESSION:   PT End of Session - 01/30/24 1224     Visit Number 24    Number of Visits 38    Date for PT Re-Evaluation 04/23/24    Progress Note Due on Visit 30    PT Start Time 1147    PT Stop Time 1229    PT Time Calculation (min) 42 min    Equipment Utilized During Treatment Gait belt    Activity Tolerance Patient tolerated treatment well;No increased pain    Behavior During Therapy WFL for tasks assessed/performed              Past Medical History:  Diagnosis Date   Ductal carcinoma in situ (DCIS) of left breast    Dysphagia    Esophageal stricture    Gastroparesis    GERD (gastroesophageal reflux disease)    Hiatal hernia    Hypertension    Iron deficiency anemia    Osteoporosis    Pulmonary embolism (HCC)    Scoliosis    UTI (urinary tract infection)    Past Surgical History:  Procedure Laterality Date   BREAST LUMPECTOMY Left 2009   Ductal Carcinoma insitu    CATARACT EXTRACTION, BILATERAL     COLONOSCOPY N/A 03/17/2021   Procedure: COLONOSCOPY;  Surgeon: Regis Bill, MD;  Location: [facility];  Service: Endoscopy;  Laterality: N/A;   COLOSTOMY REVISION Right 03/18/2021   Procedure: COLON RESECTION RIGHT;  Surgeon: Henrene Dodge, MD;  Location: [facility];  Service: General;  Laterality: Right;   LAPAROSCOPIC PARAESOPHAGEAL HERNIA REPAIR  2009   LAPAROTOMY N/A 03/10/2021   Procedure: EXPLORATORY LAPAROTOMY;  Surgeon: Henrene Dodge, MD;  Location: [facility];  Service: General;  Laterality: N/A;   TUBAL LIGATION     Patient Active Problem List   Diagnosis Date Noted   Acute on chronic diastolic CHF (congestive heart failure) (HCC) 08/19/2023   Acute respiratory failure with hypoxia (HCC) 08/17/2023    History of pulmonary embolus (PE) 08/17/2023   Basal cell carcinoma of leg 04/01/2021   Coronary atherosclerosis 04/01/2021   History of pulmonary embolism 04/01/2021   Pulmonary nodule 04/01/2021   Malignant neoplasm of colon (HCC) 03/29/2021   Rectal bleeding 03/15/2021   Hypertension    GERD (gastroesophageal reflux disease)    Colonic mass    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HCC) 03/10/2021   Anemia, unspecified 03/06/2021   Bronchiectasis without complication (HCC) 03/27/2020   Iron deficiency anemia 03/27/2020   Moderate aortic stenosis 03/27/2020   Stage 3a chronic kidney disease (HCC) 07/04/2019   Hypnic headache 05/28/2019   Telogen effluvium 04/17/2019   Xerosis cutis 04/17/2019   Essential hypertension 01/14/2019   Cervical radiculopathy 05/04/2018   Osteoarthritis of left glenohumeral joint 05/04/2018   Vitamin B12 deficiency 01/01/2018   Vascular abnormality 09/11/2017   Dyspepsia 03/28/2017   Pulmonary embolism (HCC) 08/16/2016   Dilated pore of [place] 03/23/2015   Retinal drusen of both eyes 08/28/2014   Status post right cataract extraction 07/30/2014   Verruca 03/19/2014   Chronic back pain 11/06/2013   Postmenopausal osteoporosis 06/11/2013   Anticoagulated on Coumadin 04/06/2013   Long term (current) use of anticoagulants 10/18/2012   Preglaucoma 05/10/2012   Presbyopia 05/10/2012   Senile nuclear sclerosis 05/10/2012   Lobular  carcinoma in situ of breast 04/13/2012   Closed fracture of ankle 02/27/2012   History of nonmelanoma skin cancer 09/05/2011   Other benign neoplasm of skin of trunk 09/05/2011   Osteoporosis 08/25/2011   Transient alteration of awareness 09/03/2009   Colon adenoma 07/30/2002    ONSET DATE: 3 months   REFERRING DIAG:  Diagnosis  R53.83 (ICD-10-CM) - Other fatigue    THERAPY DIAG:  Muscle weakness (generalized)  Unsteadiness on feet  Difficulty in walking, not elsewhere classified  Rationale for Evaluation and Treatment:  Rehabilitation  SUBJECTIVE:                                                                                                                                                                                             SUBJECTIVE STATEMENT:   Pt reports no major updates. Pt reports no stumbles/falls. No changes in pain. HEP is going OK.  Pt accompanied by: self  PERTINENT HISTORY:  From recent MD visit.  89 y.o. here for an acute issue. She has a PMH of chronic PE/Coumadin, anemia, colon cancer, breast cancer, large hiatal hernia, moderate aortic stenosis who has concerns about shortness of breath, nausea, generalized weakness. No chest pain. History of hiatal hernia and recent CT chest. Also recent endoscopy. Takes Zofran chronically. Has felt weaker and almost fell recently. Uses a walker. Also on chronic UTI suppression, Keflex.   PAIN:  Are you having pain? Yes: NPRS scale: 5/10 Pain location: low back  Pain description: sore  Aggravating factors: movement  Relieving factors: rest   PRECAUTIONS: Fall  RED FLAGS: None   WEIGHT BEARING RESTRICTIONS: No  FALLS: Has patient fallen in last 6 months? No  LIVING ENVIRONMENT: Lives with: lives alone Lives in: House/apartment Stairs: Yes: External: 1 steps; none Has following equipment at home: Walker - 4 wheeled  PLOF: Independent with household mobility with device and Independent with community mobility with device  PATIENT GOALS: improved strength in BLE   OBJECTIVE:   DIAGNOSTIC FINDINGS:  06/12/2023 EXAM: CT CHEST, ABDOMEN, AND PELVIS WITH CONTRAST IMPRESSION: 1. Status post right hemicolectomy, without recurrent or metastatic disease. Decreased sensitivity exam, primarily involving the abdomen pelvis, due to paucity of fat. 2. New tiny left groin hernia containing fluid. Consider physical exam correlation. 3. Incidental findings, including: Coronary artery atherosclerosis. Aortic Atherosclerosis (ICD10-I70.0).  Dilated esophagus with contrast throughout, suggesting dysmotility and/or gastroesophageal reflux.  COGNITION: Overall cognitive status: Within functional limits for tasks assessed   SENSATION: Light touch: Impaired  mild paraesthesia in the proximal RLE   COORDINATION: WFL. Ankle to knee limited ROM due to weakness in the R hip  POSTURE: rounded shoulders, forward head, flexed trunk , and severe scoliosis     LOWER EXTREMITY MMT:    MMT Right Eval Left Eval  Hip flexion 4- 4  Hip extension    Hip abduction 4- 44  Hip adduction 4 4  Hip internal rotation    Hip external rotation    Knee flexion 4- 4  Knee extension 4 4+  Ankle dorsiflexion 4 4+  Ankle plantarflexion    Ankle inversion    Ankle eversion    (Blank rows = not tested)    TRANSFERS: Assistive device utilized: Environmental consultant - 4 wheeled  Sit to stand: SBA Stand to sit: SBA Chair to chair: SBA Floor:  Unable to perform    STAIRS: Level of Assistance:  pt declines Stair Negotiation Technique: Step to Pattern with Bilateral Rails Number of Stairs: 4  Height of Stairs: 6  Comments: will need to assess.   GAIT: Gait pattern: step through pattern, Right foot flat, and lateral hip instability Distance walked: 252ft Assistive device utilized: Walker - 4 wheeled Level of assistance: Modified independence and SBA Comments: severe scoliosis.    FUNCTIONAL TESTS:  5 times sit to stand: 30.86 Timed up and go (TUG): 29.39 2 minute walk test: 286ft  10 meter walk test: 0.694m/s, SOB 4-5.  Berg Balance Scale: TBD  PATIENT SURVEYS:  FOTO 56. goal 64  TODAY'S TREATMENT:                                                                                                                              DATE:  01/30/24   Physical Performance: 5xSTS - MET, to assess LE strength/power and fall risk BERG - score improved, to assess balance fall risk - MET, to assess gait speed TUG - improved, to assess  fall risk - MET to assess functional capacity Comments: please refer to goal section and assessment below for full details of scores/performance. PT reviewed results with pt throughout and pt's progress/plan    PATIENT EDUCATION: Education details: Pt educated throughout session about proper posture and technique with exercises. Improved exercise technique, movement at target joints, use of target muscles after min to mod verbal, visual, tactile cues.  Person educated: Patient Education method: Explanation Education comprehension: verbalized understanding and returned demonstration  HOME EXERCISE PROGRAM: Access Code: [email or web address] URL: [email or web address] Date: 09/25/2023 Prepared by: Grier Rocher  Exercises - Seated Hip Abduction with Resistance  - 1 x daily - 4 x weekly - 3 sets - 15 reps - 3 hold - Seated March with Resistance  - 1 x daily - 4 x weekly - 3 sets - 10 reps - 1 hold - Seated Long Arc Quad  - 1 x daily - 4 x weekly - 3 sets - 12 reps - 3 hold - Seated Hip Adduction Isometrics with Ball  - 1 x daily - 4 x weekly - 3 sets - 12 reps - 3 hold - Standing  March with Counter Support  - 1 x daily - 3 x weekly - 2 sets - 10 reps - Standing Hip Abduction with Counter Support  - 1 x daily - 3 x weekly - 2 sets - 10 reps - Standing Hip Extension with Counter Support  - 1 x daily - 3 x weekly - 2 sets - 10 reps - Mini Squat with Counter Support  - 1 x daily - 3 x weekly - 2 sets - 10 reps - Standing Knee Flexion with Counter Support  - 1 x daily - 3 x weekly - 2 sets - 10 reps - Standing Tandem Balance with Counter Support  - 1 x daily - 3 x weekly - 2 sets - 4 reps - 10 hold  GOALS: Goals reviewed with patient? Yes   SHORT TERM GOALS: Target date: 12/28/23    Patient will be independent in home exercise program to improve strength/mobility for better functional independence with ADLs. Baseline:to be given at next session; 10/23/2023- Patient reports  independence with HEP provided and no significant issues.   Goal status: MET 2. Pt will be able to ambulate for at least 5 min without stopping with LRAD to allow improved access to community and return to PLOF  Baseline: 3 minutes. 2/11: 6 minute walk test completed.   Goal Status: MET   . LONG TERM GOALS: Target date: 04/23/2024   Patient will increase FOTO score to equal to or greater than  61   to demonstrate statistically significant improvement in mobility and quality of life.  Baseline: 56; 10/23/2023= 60 Goal status: PROGRESSING/DISCONTINUED   2.  Patient (> 59 years old) will complete five times sit to stand test in < 15 seconds  without UE support indicating an increased LE strength and improved balance. Baseline: 30.86 sec; 10/23/2023= 22.08 using 1 UE support.  12/11: 14.67 sec BUE from arm rest. And 14.12 with 1 UE pushing from arm rest. 01/30/24 13 seconds with UE  Goal status: MET  3.  Patient will increase Berg Balance score to >45 points to demonstrate decreased fall risk during functional activities  Baseline: 26; 10/23/2023= 30 12/11: 34 01/30/24: 41/56 Goal status: MET/ Adjusted   4.  Patient will increase 10 meter walk test to >1.32m/s as to improve gait speed for better community ambulation and to reduce fall risk. Baseline: 0.611m/s; 10/23/2023= 0.88 m/s (normal speed with 4WW) 12/11: Average Fast speed: 1.05 m/s Average Normal speed: 0.834 m/s 01/30/24: 1.14 m/s Goal status: MET  5.  Patient will reduce timed up and go to <11 seconds to reduce fall risk and demonstrate improved transfer/gait ability. Baseline: 29.39 12/2: 16.4 sec with Rollator  12/11: 15.19 sec with rollator  2/4: 17.38 sec with rollator  3/4: 15 sec with rollator   Goal status: IN PROGRESS  6.  Patient will 2 min walk test by at least 64ft as to demonstrate reduced fall risk and improved dynamic gait balance for better safety with community/home ambulation.   Baseline: 249ft; 10/23/2023=  360 feet  with 4WW 324ft with 4WW Goal status: MET  7. Pt will complete 6 min walk test without stopping and improve overall score by at least 110ft to indicate increased access to community.    Baseline: to be complete 2/11:880 ft with RW; 01/30/24: 1168 ft with RW  Goal: MET    ASSESSMENT: CLINICAL IMPRESSION:  Goal reassessment completed for recert. Pt making excellent progress AEB meeting 5xSTS, and goals, indicating improved LE strength/power,  gait speed and functional capacity. Pt also making gains on all remaining goals: TUG and BERG, indicating improved balance working toward threshold for decreased fall risk. Patient's condition has the potential to improve in response to therapy. Maximum improvement is yet to be obtained. The anticipated improvement is attainable and reasonable in a generally predictable time. Pt will continue to benefit from skilled physical therapy intervention to address impairments, improve QOL, and attain therapy goals.       OBJECTIVE IMPAIRMENTS: Abnormal gait, cardiopulmonary status limiting activity, decreased activity tolerance, decreased balance, decreased endurance, decreased mobility, difficulty walking, decreased ROM, decreased strength, decreased safety awareness, hypomobility, increased fascial restrictions, impaired flexibility, improper body mechanics, and postural dysfunction.   ACTIVITY LIMITATIONS: lifting, standing, squatting, transfers, and locomotion level  PARTICIPATION LIMITATIONS: laundry, shopping, and community activity  PERSONAL FACTORS: 3+ comorbidities: scoliosis, hx of CA, and PE  are also affecting patient's functional outcome.   REHAB POTENTIAL: Good  CLINICAL DECISION MAKING: Stable/uncomplicated  EVALUATION COMPLEXITY: Moderate  PLAN:  PT FREQUENCY: 1-2x/week  PT DURATION: 12 weeks  PLANNED INTERVENTIONS: Therapeutic exercises, Therapeutic activity, Neuromuscular re-education, Balance training, Gait  training, Patient/Family education, Self Care, Joint mobilization, Stair training, Vestibular training, DME instructions, Dry Needling, Spinal mobilization, Cryotherapy, Moist heat, and Manual therapy  PLAN FOR NEXT SESSION: Continue Dynamic balance and gait training. Progressing to higher level variable direction stepping and reaching tasks - forced R weight bearing and lifting L LE. BLE and postural strengthening. Continue plan   Temple Pacini PT, DPT  Physical Therapist - [facility]  [facility] [place]  1:26 PM 01/30/24

## 2024-01-31 ENCOUNTER — Inpatient Hospital Stay: Payer: Medicare Other | Attending: Oncology

## 2024-01-31 DIAGNOSIS — E538 Deficiency of other specified B group vitamins: Secondary | ICD-10-CM | POA: Diagnosis present

## 2024-01-31 DIAGNOSIS — D508 Other iron deficiency anemias: Secondary | ICD-10-CM

## 2024-01-31 MED ORDER — CYANOCOBALAMIN 1000 MCG/ML IJ SOLN
1000.0000 ug | INTRAMUSCULAR | Status: DC
  Administered 2024-01-31: 1000 ug via INTRAMUSCULAR
  Filled 2024-01-31: qty 1

## 2024-02-05 ENCOUNTER — Ambulatory Visit: Payer: Medicare Other | Admitting: Physical Therapy

## 2024-02-05 DIAGNOSIS — M6281 Muscle weakness (generalized): Secondary | ICD-10-CM | POA: Diagnosis not present

## 2024-02-05 DIAGNOSIS — R278 Other lack of coordination: Secondary | ICD-10-CM

## 2024-02-05 DIAGNOSIS — M5459 Other low back pain: Secondary | ICD-10-CM

## 2024-02-05 DIAGNOSIS — R2689 Other abnormalities of gait and mobility: Secondary | ICD-10-CM

## 2024-02-05 DIAGNOSIS — R262 Difficulty in walking, not elsewhere classified: Secondary | ICD-10-CM

## 2024-02-05 DIAGNOSIS — R2681 Unsteadiness on feet: Secondary | ICD-10-CM

## 2024-02-05 NOTE — Therapy (Unsigned)
 OUTPATIENT PHYSICAL THERAPY NEURO TREATMENT NOTE    Patient Name: Kristina Rowe MRN: [id number] DOB:27-Jun-1930, 89 y.o., female Today's Date: 02/05/2024   PCP: Enid Baas, MD  REFERRING PROVIDER:   Ignacia Bayley, PA-C    END OF SESSION:   PT End of Session - 02/05/24 1542     Visit Number 25    Number of Visits 38    Date for PT Re-Evaluation 04/23/24    Progress Note Due on Visit 30    PT Start Time 1540    PT Stop Time 1615    PT Time Calculation (min) 35 min    Equipment Utilized During Treatment Gait belt    Activity Tolerance Patient tolerated treatment well;No increased pain    Behavior During Therapy WFL for tasks assessed/performed              Past Medical History:  Diagnosis Date   Ductal carcinoma in situ (DCIS) of left breast    Dysphagia    Esophageal stricture    Gastroparesis    GERD (gastroesophageal reflux disease)    Hiatal hernia    Hypertension    Iron deficiency anemia    Osteoporosis    Pulmonary embolism (HCC)    Scoliosis    UTI (urinary tract infection)    Past Surgical History:  Procedure Laterality Date   BREAST LUMPECTOMY Left 2009   Ductal Carcinoma insitu    CATARACT EXTRACTION, BILATERAL     COLONOSCOPY N/A 03/17/2021   Procedure: COLONOSCOPY;  Surgeon: Regis Bill, MD;  Location: [facility];  Service: Endoscopy;  Laterality: N/A;   COLOSTOMY REVISION Right 03/18/2021   Procedure: COLON RESECTION RIGHT;  Surgeon: Henrene Dodge, MD;  Location: [facility];  Service: General;  Laterality: Right;   LAPAROSCOPIC PARAESOPHAGEAL HERNIA REPAIR  2009   LAPAROTOMY N/A 03/10/2021   Procedure: EXPLORATORY LAPAROTOMY;  Surgeon: Henrene Dodge, MD;  Location: [facility];  Service: General;  Laterality: N/A;   TUBAL LIGATION     Patient Active Problem List   Diagnosis Date Noted   Acute on chronic diastolic CHF (congestive heart failure) (HCC) 08/19/2023   Acute respiratory failure with hypoxia (HCC) 08/17/2023    History of pulmonary embolus (PE) 08/17/2023   Basal cell carcinoma of leg 04/01/2021   Coronary atherosclerosis 04/01/2021   History of pulmonary embolism 04/01/2021   Pulmonary nodule 04/01/2021   Malignant neoplasm of colon (HCC) 03/29/2021   Rectal bleeding 03/15/2021   Hypertension    GERD (gastroesophageal reflux disease)    Colonic mass    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HCC) 03/10/2021   Anemia, unspecified 03/06/2021   Bronchiectasis without complication (HCC) 03/27/2020   Iron deficiency anemia 03/27/2020   Moderate aortic stenosis 03/27/2020   Stage 3a chronic kidney disease (HCC) 07/04/2019   Hypnic headache 05/28/2019   Telogen effluvium 04/17/2019   Xerosis cutis 04/17/2019   Essential hypertension 01/14/2019   Cervical radiculopathy 05/04/2018   Osteoarthritis of left glenohumeral joint 05/04/2018   Vitamin B12 deficiency 01/01/2018   Vascular abnormality 09/11/2017   Dyspepsia 03/28/2017   Pulmonary embolism (HCC) 08/16/2016   Dilated pore of [place] 03/23/2015   Retinal drusen of both eyes 08/28/2014   Status post right cataract extraction 07/30/2014   Verruca 03/19/2014   Chronic back pain 11/06/2013   Postmenopausal osteoporosis 06/11/2013   Anticoagulated on Coumadin 04/06/2013   Long term (current) use of anticoagulants 10/18/2012   Preglaucoma 05/10/2012   Presbyopia 05/10/2012   Senile nuclear sclerosis 05/10/2012   Lobular  carcinoma in situ of breast 04/13/2012   Closed fracture of ankle 02/27/2012   History of nonmelanoma skin cancer 09/05/2011   Other benign neoplasm of skin of trunk 09/05/2011   Osteoporosis 08/25/2011   Transient alteration of awareness 09/03/2009   Colon adenoma 07/30/2002    ONSET DATE: 3 months   REFERRING DIAG:  Diagnosis  R53.83 (ICD-10-CM) - Other fatigue    THERAPY DIAG:  Muscle weakness (generalized)  Unsteadiness on feet  Difficulty in walking, not elsewhere classified  Other abnormalities of gait and  mobility  Other low back pain  Other lack of coordination  Rationale for Evaluation and Treatment: Rehabilitation  SUBJECTIVE:                                                                                                                                                                                             SUBJECTIVE STATEMENT:   Pt reports no major updates. Pt reports no stumbles/falls. Feels like the hardest thing for her recently has been showering. Constantly holding rails when not sitting. Reports that daughter added external rails to access shower several years ago, and is now using frequently.   Pt accompanied by: self  PERTINENT HISTORY:  From recent MD visit.  89 y.o. here for an acute issue. She has a PMH of chronic PE/Coumadin, anemia, colon cancer, breast cancer, large hiatal hernia, moderate aortic stenosis who has concerns about shortness of breath, nausea, generalized weakness. No chest pain. History of hiatal hernia and recent CT chest. Also recent endoscopy. Takes Zofran chronically. Has felt weaker and almost fell recently. Uses a walker. Also on chronic UTI suppression, Keflex.   PAIN:  Are you having pain? Yes: NPRS scale: 5/10 Pain location: low back  Pain description: sore  Aggravating factors: movement  Relieving factors: rest   PRECAUTIONS: Fall  RED FLAGS: None   WEIGHT BEARING RESTRICTIONS: No  FALLS: Has patient fallen in last 6 months? No  LIVING ENVIRONMENT: Lives with: lives alone Lives in: House/apartment Stairs: Yes: External: 1 steps; none Has following equipment at home: Walker - 4 wheeled  PLOF: Independent with household mobility with device and Independent with community mobility with device  PATIENT GOALS: improved strength in BLE   OBJECTIVE:   DIAGNOSTIC FINDINGS:  06/12/2023 EXAM: CT CHEST, ABDOMEN, AND PELVIS WITH CONTRAST IMPRESSION: 1. Status post right hemicolectomy, without recurrent or metastatic disease.  Decreased sensitivity exam, primarily involving the abdomen pelvis, due to paucity of fat. 2. New tiny left groin hernia containing fluid. Consider physical exam correlation. 3. Incidental findings, including: Coronary artery atherosclerosis. Aortic Atherosclerosis (ICD10-I70.0). Dilated esophagus with contrast throughout, suggesting  dysmotility and/or gastroesophageal reflux.  COGNITION: Overall cognitive status: Within functional limits for tasks assessed   SENSATION: Light touch: Impaired  mild paraesthesia in the proximal RLE   COORDINATION: WFL. Ankle to knee limited ROM due to weakness in the R hip      POSTURE: rounded shoulders, forward head, flexed trunk , and severe scoliosis     LOWER EXTREMITY MMT:    MMT Right Eval Left Eval  Hip flexion 4- 4  Hip extension    Hip abduction 4- 44  Hip adduction 4 4  Hip internal rotation    Hip external rotation    Knee flexion 4- 4  Knee extension 4 4+  Ankle dorsiflexion 4 4+  Ankle plantarflexion    Ankle inversion    Ankle eversion    (Blank rows = not tested)    TRANSFERS: Assistive device utilized: Environmental consultant - 4 wheeled  Sit to stand: SBA Stand to sit: SBA Chair to chair: SBA Floor:  Unable to perform    STAIRS: Level of Assistance:  pt declines Stair Negotiation Technique: Step to Pattern with Bilateral Rails Number of Stairs: 4  Height of Stairs: 6  Comments: will need to assess.   GAIT: Gait pattern: step through pattern, Right foot flat, and lateral hip instability Distance walked: 265ft Assistive device utilized: Walker - 4 wheeled Level of assistance: Modified independence and SBA Comments: severe scoliosis.    FUNCTIONAL TESTS:  5 times sit to stand: 30.86 Timed up and go (TUG): 29.39 2 minute walk test: 279ft  10 meter walk test: 0.617m/s, SOB 4-5.  Berg Balance Scale: TBD  PATIENT SURVEYS:  FOTO 56. goal 64  TODAY'S TREATMENT:                                                                                                                               DATE:  02/05/24  Standing airex pad:  Normal BOS no UE support 3 x 30 sec  Narrow BOS no UE support 2 x 20 sec  Semitandem 2 x 15sec bil   From level surface  Foot tap on airex pad x10 bil with BUE support  Foot tap on airex pad x 10 bil without UE support  Lateral foot tap on airex pad x 5 with UE support and 2 x 6 without UE support bil   Foot tap on 6 inch step x 10 bil with UE support; then performed x 10 bil without Ue support with min assist for improved weight shift to the R.   Step up 6inch step x with 1-2 UE support. Performed x 8 bil then x 5 bil with therapeutic rest break between bouts.   Throughout session, PT provided CGA to min assist to prevent lateral L LOB with unlevel surface and narrow BOS  PATIENT EDUCATION: Education details: Pt educated throughout session about proper posture and technique with exercises. Improved exercise technique, movement at target joints, use of target muscles  after min to mod verbal, visual, tactile cues.  Person educated: Patient Education method: Explanation Education comprehension: verbalized understanding and returned demonstration  HOME EXERCISE PROGRAM: Access Code: [email or web address] URL: [email or web address] Date: 09/25/2023 Prepared by: Grier Rocher  Exercises - Seated Hip Abduction with Resistance  - 1 x daily - 4 x weekly - 3 sets - 15 reps - 3 hold - Seated March with Resistance  - 1 x daily - 4 x weekly - 3 sets - 10 reps - 1 hold - Seated Long Arc Quad  - 1 x daily - 4 x weekly - 3 sets - 12 reps - 3 hold - Seated Hip Adduction Isometrics with Ball  - 1 x daily - 4 x weekly - 3 sets - 12 reps - 3 hold - Standing March with Counter Support  - 1 x daily - 3 x weekly - 2 sets - 10 reps - Standing Hip Abduction with Counter Support  - 1 x daily - 3 x weekly - 2 sets - 10 reps - Standing Hip Extension with Counter Support  - 1 x daily - 3 x weekly -  2 sets - 10 reps - Mini Squat with Counter Support  - 1 x daily - 3 x weekly - 2 sets - 10 reps - Standing Knee Flexion with Counter Support  - 1 x daily - 3 x weekly - 2 sets - 10 reps - Standing Tandem Balance with Counter Support  - 1 x daily - 3 x weekly - 2 sets - 4 reps - 10 hold  GOALS: Goals reviewed with patient? Yes   SHORT TERM GOALS: Target date: 12/28/23    Patient will be independent in home exercise program to improve strength/mobility for better functional independence with ADLs. Baseline:to be given at next session; 10/23/2023- Patient reports independence with HEP provided and no significant issues.   Goal status: MET 2. Pt will be able to ambulate for at least 5 min without stopping with LRAD to allow improved access to community and return to PLOF  Baseline: 3 minutes. 2/11: 6 minute walk test completed.   Goal Status: MET   . LONG TERM GOALS: Target date: 04/23/2024   Patient will increase FOTO score to equal to or greater than  61   to demonstrate statistically significant improvement in mobility and quality of life.  Baseline: 56; 10/23/2023= 60 Goal status: PROGRESSING/DISCONTINUED   2.  Patient (> 88 years old) will complete five times sit to stand test in < 15 seconds  without UE support indicating an increased LE strength and improved balance. Baseline: 30.86 sec; 10/23/2023= 22.08 using 1 UE support.  12/11: 14.67 sec BUE from arm rest. And 14.12 with 1 UE pushing from arm rest. 01/30/24 13 seconds with UE  Goal status: MET  3.  Patient will increase Berg Balance score to >45 points to demonstrate decreased fall risk during functional activities  Baseline: 26; 10/23/2023= 30 12/11: 34 01/30/24: 41/56 Goal status: MET/ Adjusted   4.  Patient will increase 10 meter walk test to >1.9m/s as to improve gait speed for better community ambulation and to reduce fall risk. Baseline: 0.657m/s; 10/23/2023= 0.88 m/s (normal speed with 4WW) 12/11: Average Fast speed:  1.05 m/s Average Normal speed: 0.834 m/s 01/30/24: 1.14 m/s Goal status: MET  5.  Patient will reduce timed up and go to <11 seconds to reduce fall risk and demonstrate improved transfer/gait ability. Baseline: 29.39 12/2: 16.4 sec with Rollator  12/11: 15.19 sec with  rollator  2/4: 17.38 sec with rollator  3/4: 15 sec with rollator   Goal status: IN PROGRESS  6.  Patient will 2 min walk test by at least 76ft as to demonstrate reduced fall risk and improved dynamic gait balance for better safety with community/home ambulation.   Baseline: 264ft; 10/23/2023= 360 feet  with 4WW 354ft with 4WW Goal status: MET  7. Pt will complete 6 min walk test without stopping and improve overall score by at least 150ft to indicate increased access to community.    Baseline: to be complete 2/11:880 ft with RW; 01/30/24: 1168 ft with RW  Goal: MET    ASSESSMENT: CLINICAL IMPRESSION:  Pt arrived motivated to participate. PT treatment focused pt improved balance and functional movement patterns to improve strength and independence on stairs and unlevel surfaces. Pt noted to have lateral LOB to the L, requiring CGA/min assist to prevent fall in semitandem and elevated stance.  Pt will continue to benefit from skilled physical therapy intervention to address impairments, improve QOL, and attain therapy goals.       OBJECTIVE IMPAIRMENTS: Abnormal gait, cardiopulmonary status limiting activity, decreased activity tolerance, decreased balance, decreased endurance, decreased mobility, difficulty walking, decreased ROM, decreased strength, decreased safety awareness, hypomobility, increased fascial restrictions, impaired flexibility, improper body mechanics, and postural dysfunction.   ACTIVITY LIMITATIONS: lifting, standing, squatting, transfers, and locomotion level  PARTICIPATION LIMITATIONS: laundry, shopping, and community activity  PERSONAL FACTORS: 3+ comorbidities: scoliosis, hx of CA, and PE  are  also affecting patient's functional outcome.   REHAB POTENTIAL: Good  CLINICAL DECISION MAKING: Stable/uncomplicated  EVALUATION COMPLEXITY: Moderate  PLAN:  PT FREQUENCY: 1-2x/week  PT DURATION: 12 weeks  PLANNED INTERVENTIONS: Therapeutic exercises, Therapeutic activity, Neuromuscular re-education, Balance training, Gait training, Patient/Family education, Self Care, Joint mobilization, Stair training, Vestibular training, DME instructions, Dry Needling, Spinal mobilization, Cryotherapy, Moist heat, and Manual therapy  PLAN FOR NEXT SESSION:  Continue Dynamic balance and gait training. Progressing to higher level variable direction stepping and reaching tasks - forced R weight bearing and lifting L LE.  BLE and postural strengthening. Continue plan   Grier Rocher PT, DPT  Physical Therapist - [facility]  8:01 AM 02/06/24

## 2024-02-07 ENCOUNTER — Inpatient Hospital Stay: Payer: Medicare Other

## 2024-02-07 ENCOUNTER — Ambulatory Visit: Payer: Medicare Other | Admitting: Physical Therapy

## 2024-02-07 DIAGNOSIS — R2681 Unsteadiness on feet: Secondary | ICD-10-CM

## 2024-02-07 DIAGNOSIS — R262 Difficulty in walking, not elsewhere classified: Secondary | ICD-10-CM

## 2024-02-07 DIAGNOSIS — M6281 Muscle weakness (generalized): Secondary | ICD-10-CM

## 2024-02-07 DIAGNOSIS — R2689 Other abnormalities of gait and mobility: Secondary | ICD-10-CM

## 2024-02-07 DIAGNOSIS — R278 Other lack of coordination: Secondary | ICD-10-CM

## 2024-02-07 DIAGNOSIS — M5459 Other low back pain: Secondary | ICD-10-CM

## 2024-02-07 NOTE — Therapy (Signed)
 OUTPATIENT PHYSICAL THERAPY NEURO TREATMENT NOTE    Patient Name: Kristina Rowe MRN: [id number] DOB:Sep 12, 1930, 89 y.o., female Today's Date: 02/07/2024   PCP: Enid Baas, MD  REFERRING PROVIDER:   Ignacia Bayley, PA-C    END OF SESSION:   PT End of Session - 02/07/24 1407     Visit Number 26    Number of Visits 38    Date for PT Re-Evaluation 04/23/24    Progress Note Due on Visit 30    PT Start Time 0204    PT Stop Time 0245    PT Time Calculation (min) 41 min    Equipment Utilized During Treatment Gait belt    Activity Tolerance Patient tolerated treatment well;No increased pain    Behavior During Therapy WFL for tasks assessed/performed              Past Medical History:  Diagnosis Date   Ductal carcinoma in situ (DCIS) of left breast    Dysphagia    Esophageal stricture    Gastroparesis    GERD (gastroesophageal reflux disease)    Hiatal hernia    Hypertension    Iron deficiency anemia    Osteoporosis    Pulmonary embolism (HCC)    Scoliosis    UTI (urinary tract infection)    Past Surgical History:  Procedure Laterality Date   BREAST LUMPECTOMY Left 2009   Ductal Carcinoma insitu    CATARACT EXTRACTION, BILATERAL     COLONOSCOPY N/A 03/17/2021   Procedure: COLONOSCOPY;  Surgeon: Regis Bill, MD;  Location: [facility];  Service: Endoscopy;  Laterality: N/A;   COLOSTOMY REVISION Right 03/18/2021   Procedure: COLON RESECTION RIGHT;  Surgeon: Henrene Dodge, MD;  Location: [facility];  Service: General;  Laterality: Right;   LAPAROSCOPIC PARAESOPHAGEAL HERNIA REPAIR  2009   LAPAROTOMY N/A 03/10/2021   Procedure: EXPLORATORY LAPAROTOMY;  Surgeon: Henrene Dodge, MD;  Location: [facility];  Service: General;  Laterality: N/A;   TUBAL LIGATION     Patient Active Problem List   Diagnosis Date Noted   Acute on chronic diastolic CHF (congestive heart failure) (HCC) 08/19/2023   Acute respiratory failure with hypoxia (HCC) 08/17/2023    History of pulmonary embolus (PE) 08/17/2023   Basal cell carcinoma of leg 04/01/2021   Coronary atherosclerosis 04/01/2021   History of pulmonary embolism 04/01/2021   Pulmonary nodule 04/01/2021   Malignant neoplasm of colon (HCC) 03/29/2021   Rectal bleeding 03/15/2021   Hypertension    GERD (gastroesophageal reflux disease)    Colonic mass    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HCC) 03/10/2021   Anemia, unspecified 03/06/2021   Bronchiectasis without complication (HCC) 03/27/2020   Iron deficiency anemia 03/27/2020   Moderate aortic stenosis 03/27/2020   Stage 3a chronic kidney disease (HCC) 07/04/2019   Hypnic headache 05/28/2019   Telogen effluvium 04/17/2019   Xerosis cutis 04/17/2019   Essential hypertension 01/14/2019   Cervical radiculopathy 05/04/2018   Osteoarthritis of left glenohumeral joint 05/04/2018   Vitamin B12 deficiency 01/01/2018   Vascular abnormality 09/11/2017   Dyspepsia 03/28/2017   Pulmonary embolism (HCC) 08/16/2016   Dilated pore of [place] 03/23/2015   Retinal drusen of both eyes 08/28/2014   Status post right cataract extraction 07/30/2014   Verruca 03/19/2014   Chronic back pain 11/06/2013   Postmenopausal osteoporosis 06/11/2013   Anticoagulated on Coumadin 04/06/2013   Long term (current) use of anticoagulants 10/18/2012   Preglaucoma 05/10/2012   Presbyopia 05/10/2012   Senile nuclear sclerosis 05/10/2012   Lobular  carcinoma in situ of breast 04/13/2012   Closed fracture of ankle 02/27/2012   History of nonmelanoma skin cancer 09/05/2011   Other benign neoplasm of skin of trunk 09/05/2011   Osteoporosis 08/25/2011   Transient alteration of awareness 09/03/2009   Colon adenoma 07/30/2002    ONSET DATE: 3 months   REFERRING DIAG:  Diagnosis  R53.83 (ICD-10-CM) - Other fatigue    THERAPY DIAG:  Muscle weakness (generalized)  Unsteadiness on feet  Difficulty in walking, not elsewhere classified  Other abnormalities of gait and  mobility  Other low back pain  Other lack of coordination  Rationale for Evaluation and Treatment: Rehabilitation  SUBJECTIVE:                                                                                                                                                                                             SUBJECTIVE STATEMENT:   Pt reports no major updates. Pt reports no stumbles/falls. Will see daughter this weekend. No other updates   Pt accompanied by: self  PERTINENT HISTORY:  From recent MD visit.  89 y.o. here for an acute issue. She has a PMH of chronic PE/Coumadin, anemia, colon cancer, breast cancer, large hiatal hernia, moderate aortic stenosis who has concerns about shortness of breath, nausea, generalized weakness. No chest pain. History of hiatal hernia and recent CT chest. Also recent endoscopy. Takes Zofran chronically. Has felt weaker and almost fell recently. Uses a walker. Also on chronic UTI suppression, Keflex.   PAIN:  Are you having pain? Yes: NPRS scale: 5/10 Pain location: low back  Pain description: sore  Aggravating factors: movement  Relieving factors: rest   PRECAUTIONS: Fall  RED FLAGS: None   WEIGHT BEARING RESTRICTIONS: No  FALLS: Has patient fallen in last 6 months? No  LIVING ENVIRONMENT: Lives with: lives alone Lives in: House/apartment Stairs: Yes: External: 1 steps; none Has following equipment at home: Walker - 4 wheeled  PLOF: Independent with household mobility with device and Independent with community mobility with device  PATIENT GOALS: improved strength in BLE   OBJECTIVE:   DIAGNOSTIC FINDINGS:  06/12/2023 EXAM: CT CHEST, ABDOMEN, AND PELVIS WITH CONTRAST IMPRESSION: 1. Status post right hemicolectomy, without recurrent or metastatic disease. Decreased sensitivity exam, primarily involving the abdomen pelvis, due to paucity of fat. 2. New tiny left groin hernia containing fluid. Consider physical exam  correlation. 3. Incidental findings, including: Coronary artery atherosclerosis. Aortic Atherosclerosis (ICD10-I70.0). Dilated esophagus with contrast throughout, suggesting dysmotility and/or gastroesophageal reflux.  COGNITION: Overall cognitive status: Within functional limits for tasks assessed   SENSATION: Light touch: Impaired  mild paraesthesia in the  proximal RLE   COORDINATION: WFL. Ankle to knee limited ROM due to weakness in the R hip      POSTURE: rounded shoulders, forward head, flexed trunk , and severe scoliosis     LOWER EXTREMITY MMT:    MMT Right Eval Left Eval  Hip flexion 4- 4  Hip extension    Hip abduction 4- 44  Hip adduction 4 4  Hip internal rotation    Hip external rotation    Knee flexion 4- 4  Knee extension 4 4+  Ankle dorsiflexion 4 4+  Ankle plantarflexion    Ankle inversion    Ankle eversion    (Blank rows = not tested)    TRANSFERS: Assistive device utilized: Environmental consultant - 4 wheeled  Sit to stand: SBA Stand to sit: SBA Chair to chair: SBA Floor:  Unable to perform    STAIRS: Level of Assistance:  pt declines Stair Negotiation Technique: Step to Pattern with Bilateral Rails Number of Stairs: 4  Height of Stairs: 6  Comments: will need to assess.   GAIT: Gait pattern: step through pattern, Right foot flat, and lateral hip instability Distance walked: 223ft Assistive device utilized: Walker - 4 wheeled Level of assistance: Modified independence and SBA Comments: severe scoliosis.    FUNCTIONAL TESTS:  5 times sit to stand: 30.86 Timed up and go (TUG): 29.39 2 minute walk test: 225ft  10 meter walk test: 0.634m/s, SOB 4-5.  Berg Balance Scale: TBD  PATIENT SURVEYS:  FOTO 56. goal 64  TODAY'S TREATMENT:                                                                                                                              DATE:  02/07/24   Seated therex/circuit training :  LAQ 2# AW 2x 12  Hip abduction  RTB  2x 12 Hip flexion 2# AW 2x 12 Isometric hip adduction 2x 15  HS curl RTB 2x 12  LE press down RTB 2x 12  Weighted gait training with 2#AW 2x 150 to improve posture, step length, and improved independence with functional daily activies.   Seated shoulder horizontal abduction with trunk extension x 8  Cues for full ROM and decreased speed of movement and full ROM as tolerated.   Pt reports feeling like she had been through "quite a work out" upon completion   PATIENT EDUCATION: Education details: Pt educated throughout session about proper posture and technique with exercises. Improved exercise technique, movement at target joints, use of target muscles after min to mod verbal, visual, tactile cues.  Person educated: Patient Education method: Explanation Education comprehension: verbalized understanding and returned demonstration  HOME EXERCISE PROGRAM: Access Code: [email or web address] URL: [email or web address] Date: 09/25/2023 Prepared by: Grier Rocher  Exercises - Seated Hip Abduction with Resistance  - 1 x daily - 4 x weekly - 3 sets - 15 reps - 3 hold - Seated March with Resistance  - 1 x daily - 4 x  weekly - 3 sets - 10 reps - 1 hold - Seated Long Arc Quad  - 1 x daily - 4 x weekly - 3 sets - 12 reps - 3 hold - Seated Hip Adduction Isometrics with Ball  - 1 x daily - 4 x weekly - 3 sets - 12 reps - 3 hold - Standing March with Counter Support  - 1 x daily - 3 x weekly - 2 sets - 10 reps - Standing Hip Abduction with Counter Support  - 1 x daily - 3 x weekly - 2 sets - 10 reps - Standing Hip Extension with Counter Support  - 1 x daily - 3 x weekly - 2 sets - 10 reps - Mini Squat with Counter Support  - 1 x daily - 3 x weekly - 2 sets - 10 reps - Standing Knee Flexion with Counter Support  - 1 x daily - 3 x weekly - 2 sets - 10 reps - Standing Tandem Balance with Counter Support  - 1 x daily - 3 x weekly - 2 sets - 4 reps - 10 hold  GOALS: Goals reviewed with patient?  Yes   SHORT TERM GOALS: Target date: 12/28/23    Patient will be independent in home exercise program to improve strength/mobility for better functional independence with ADLs. Baseline:to be given at next session; 10/23/2023- Patient reports independence with HEP provided and no significant issues.   Goal status: MET 2. Pt will be able to ambulate for at least 5 min without stopping with LRAD to allow improved access to community and return to PLOF  Baseline: 3 minutes. 2/11: 6 minute walk test completed.   Goal Status: MET   . LONG TERM GOALS: Target date: 04/23/2024   Patient will increase FOTO score to equal to or greater than  61   to demonstrate statistically significant improvement in mobility and quality of life.  Baseline: 56; 10/23/2023= 60 Goal status: PROGRESSING/DISCONTINUED   2.  Patient (> 54 years old) will complete five times sit to stand test in < 15 seconds  without UE support indicating an increased LE strength and improved balance. Baseline: 30.86 sec; 10/23/2023= 22.08 using 1 UE support.  12/11: 14.67 sec BUE from arm rest. And 14.12 with 1 UE pushing from arm rest. 01/30/24 13 seconds with UE  Goal status: MET  3.  Patient will increase Berg Balance score to >45 points to demonstrate decreased fall risk during functional activities  Baseline: 26; 10/23/2023= 30 12/11: 34 01/30/24: 41/56 Goal status: MET/ Adjusted   4.  Patient will increase 10 meter walk test to >1.83m/s as to improve gait speed for better community ambulation and to reduce fall risk. Baseline: 0.666m/s; 10/23/2023= 0.88 m/s (normal speed with 4WW) 12/11: Average Fast speed: 1.05 m/s Average Normal speed: 0.834 m/s 01/30/24: 1.14 m/s Goal status: MET  5.  Patient will reduce timed up and go to <11 seconds to reduce fall risk and demonstrate improved transfer/gait ability. Baseline: 29.39 12/2: 16.4 sec with Rollator  12/11: 15.19 sec with rollator  2/4: 17.38 sec with rollator  3/4: 15 sec  with rollator   Goal status: IN PROGRESS  6.  Patient will 2 min walk test by at least 28ft as to demonstrate reduced fall risk and improved dynamic gait balance for better safety with community/home ambulation.   Baseline: 232ft; 10/23/2023= 360 feet  with 4WW 377ft with 4WW Goal status: MET  7. Pt will complete 6 min walk test without stopping  and improve overall score by at least 138ft to indicate increased access to community.    Baseline: to be complete 2/11:880 ft with RW; 01/30/24: 1168 ft with RW  Goal: MET    ASSESSMENT: CLINICAL IMPRESSION:   Pt arrived motivated to participate. PT treatment focused on BLE strengthening, and functional movement training. Tolerated increased repetitions on this day with circuit training.  Pt will continue to benefit from skilled physical therapy intervention to address impairments, improve QOL, and attain therapy goals.       OBJECTIVE IMPAIRMENTS: Abnormal gait, cardiopulmonary status limiting activity, decreased activity tolerance, decreased balance, decreased endurance, decreased mobility, difficulty walking, decreased ROM, decreased strength, decreased safety awareness, hypomobility, increased fascial restrictions, impaired flexibility, improper body mechanics, and postural dysfunction.   ACTIVITY LIMITATIONS: lifting, standing, squatting, transfers, and locomotion level  PARTICIPATION LIMITATIONS: laundry, shopping, and community activity  PERSONAL FACTORS: 3+ comorbidities: scoliosis, hx of CA, and PE  are also affecting patient's functional outcome.   REHAB POTENTIAL: Good  CLINICAL DECISION MAKING: Stable/uncomplicated  EVALUATION COMPLEXITY: Moderate  PLAN:  PT FREQUENCY: 1-2x/week  PT DURATION: 12 weeks  PLANNED INTERVENTIONS: Therapeutic exercises, Therapeutic activity, Neuromuscular re-education, Balance training, Gait training, Patient/Family education, Self Care, Joint mobilization, Stair training, Vestibular training,  DME instructions, Dry Needling, Spinal mobilization, Cryotherapy, Moist heat, and Manual therapy  PLAN FOR NEXT SESSION:  Continue Dynamic balance and gait training. Progressing to higher level variable direction stepping and reaching tasks  BLE and postural strengthening. Continue plan   Grier Rocher PT, DPT  Physical Therapist - [facility]  2:08 PM 02/07/24

## 2024-02-08 ENCOUNTER — Ambulatory Visit: Payer: Medicare Other

## 2024-02-13 ENCOUNTER — Ambulatory Visit: Payer: Medicare Other

## 2024-02-13 DIAGNOSIS — R262 Difficulty in walking, not elsewhere classified: Secondary | ICD-10-CM

## 2024-02-13 DIAGNOSIS — M6281 Muscle weakness (generalized): Secondary | ICD-10-CM | POA: Diagnosis not present

## 2024-02-13 DIAGNOSIS — R2681 Unsteadiness on feet: Secondary | ICD-10-CM

## 2024-02-13 NOTE — Therapy (Signed)
 OUTPATIENT PHYSICAL THERAPY NEURO TREATMENT NOTE    Patient Name: Kristina Rowe MRN: [id number] DOB:[birth year], 89 y.o., female Today's Date: 02/13/2024   PCP: Enid Baas, MD  REFERRING PROVIDER:   Ignacia Bayley, PA-C    END OF SESSION:   PT End of Session - 02/13/24 1407     Visit Number 27    Number of Visits 38    Date for PT Re-Evaluation 04/23/24    Progress Note Due on Visit 30    PT Start Time 1406    PT Stop Time 1446    PT Time Calculation (min) 40 min    Equipment Utilized During Treatment Gait belt    Activity Tolerance Patient tolerated treatment well;No increased pain    Behavior During Therapy WFL for tasks assessed/performed               Past Medical History:  Diagnosis Date   Ductal carcinoma in situ (DCIS) of left breast    Dysphagia    Esophageal stricture    Gastroparesis    GERD (gastroesophageal reflux disease)    Hiatal hernia    Hypertension    Iron deficiency anemia    Osteoporosis    Pulmonary embolism (HCC)    Scoliosis    UTI (urinary tract infection)    Past Surgical History:  Procedure Laterality Date   BREAST LUMPECTOMY Left 2009   Ductal Carcinoma insitu    CATARACT EXTRACTION, BILATERAL     COLONOSCOPY N/A 03/17/2021   Procedure: COLONOSCOPY;  Surgeon: Regis Bill, MD;  Location: [facility];  Service: Endoscopy;  Laterality: N/A;   COLOSTOMY REVISION Right 03/18/2021   Procedure: COLON RESECTION RIGHT;  Surgeon: Henrene Dodge, MD;  Location: [facility];  Service: General;  Laterality: Right;   LAPAROSCOPIC PARAESOPHAGEAL HERNIA REPAIR  2009   LAPAROTOMY N/A 03/10/2021   Procedure: EXPLORATORY LAPAROTOMY;  Surgeon: Henrene Dodge, MD;  Location: [facility];  Service: General;  Laterality: N/A;   TUBAL LIGATION     Patient Active Problem List   Diagnosis Date Noted   Acute on chronic diastolic CHF (congestive heart failure) (HCC) 08/19/2023   Acute respiratory failure with hypoxia (HCC) 08/17/2023    History of pulmonary embolus (PE) 08/17/2023   Basal cell carcinoma of leg 04/01/2021   Coronary atherosclerosis 04/01/2021   History of pulmonary embolism 04/01/2021   Pulmonary nodule 04/01/2021   Malignant neoplasm of colon (HCC) 03/29/2021   Rectal bleeding 03/15/2021   Hypertension    GERD (gastroesophageal reflux disease)    Colonic mass    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HCC) 03/10/2021   Anemia, unspecified 03/06/2021   Bronchiectasis without complication (HCC) 03/27/2020   Iron deficiency anemia 03/27/2020   Moderate aortic stenosis 03/27/2020   Stage 3a chronic kidney disease (HCC) 07/04/2019   Hypnic headache 05/28/2019   Telogen effluvium 04/17/2019   Xerosis cutis 04/17/2019   Essential hypertension 01/14/2019   Cervical radiculopathy 05/04/2018   Osteoarthritis of left glenohumeral joint 05/04/2018   Vitamin B12 deficiency 01/01/2018   Vascular abnormality 09/11/2017   Dyspepsia 03/28/2017   Pulmonary embolism (HCC) 08/16/2016   Dilated pore of [place] 03/23/2015   Retinal drusen of both eyes 08/28/2014   Status post right cataract extraction 07/30/2014   Verruca 03/19/2014   Chronic back pain 11/06/2013   Postmenopausal osteoporosis 06/11/2013   Anticoagulated on Coumadin 04/06/2013   Long term (current) use of anticoagulants 10/18/2012   Preglaucoma 05/10/2012   Presbyopia 05/10/2012   Senile nuclear sclerosis 05/10/2012  Lobular carcinoma in situ of breast 04/13/2012   Closed fracture of ankle 02/27/2012   History of nonmelanoma skin cancer 09/05/2011   Other benign neoplasm of skin of trunk 09/05/2011   Osteoporosis 08/25/2011   Transient alteration of awareness 09/03/2009   Colon adenoma 07/30/2002    ONSET DATE: 3 months   REFERRING DIAG:  Diagnosis  R53.83 (ICD-10-CM) - Other fatigue    THERAPY DIAG:  Muscle weakness (generalized)  Difficulty in walking, not elsewhere classified  Unsteadiness on feet  Rationale for Evaluation and Treatment:  Rehabilitation  SUBJECTIVE:                                                                                                                                                                                             SUBJECTIVE STATEMENT:   Pt feeling pretty good, has new grandbaby as of today! Pt reports no stumbles/falls. No other updates.  Pt accompanied by: self  PERTINENT HISTORY:  From recent MD visit.  89 y.o. here for an acute issue. She has a PMH of chronic PE/Coumadin, anemia, colon cancer, breast cancer, large hiatal hernia, moderate aortic stenosis who has concerns about shortness of breath, nausea, generalized weakness. No chest pain. History of hiatal hernia and recent CT chest. Also recent endoscopy. Takes Zofran chronically. Has felt weaker and almost fell recently. Uses a walker. Also on chronic UTI suppression, Keflex.   PAIN:  Are you having pain? Yes: NPRS scale: 5/10 Pain location: low back  Pain description: sore  Aggravating factors: movement  Relieving factors: rest   PRECAUTIONS: Fall  RED FLAGS: None   WEIGHT BEARING RESTRICTIONS: No  FALLS: Has patient fallen in last 6 months? No  LIVING ENVIRONMENT: Lives with: lives alone Lives in: House/apartment Stairs: Yes: External: 1 steps; none Has following equipment at home: Walker - 4 wheeled  PLOF: Independent with household mobility with device and Independent with community mobility with device  PATIENT GOALS: improved strength in BLE   OBJECTIVE:   DIAGNOSTIC FINDINGS:  06/12/2023 EXAM: CT CHEST, ABDOMEN, AND PELVIS WITH CONTRAST IMPRESSION: 1. Status post right hemicolectomy, without recurrent or metastatic disease. Decreased sensitivity exam, primarily involving the abdomen pelvis, due to paucity of fat. 2. New tiny left groin hernia containing fluid. Consider physical exam correlation. 3. Incidental findings, including: Coronary artery atherosclerosis. Aortic Atherosclerosis  (ICD10-I70.0). Dilated esophagus with contrast throughout, suggesting dysmotility and/or gastroesophageal reflux.  COGNITION: Overall cognitive status: Within functional limits for tasks assessed   SENSATION: Light touch: Impaired  mild paraesthesia in the proximal RLE   COORDINATION: WFL. Ankle to knee limited ROM due to weakness in the R  hip      POSTURE: rounded shoulders, forward head, flexed trunk , and severe scoliosis     LOWER EXTREMITY MMT:    MMT Right Eval Left Eval  Hip flexion 4- 4  Hip extension    Hip abduction 4- 44  Hip adduction 4 4  Hip internal rotation    Hip external rotation    Knee flexion 4- 4  Knee extension 4 4+  Ankle dorsiflexion 4 4+  Ankle plantarflexion    Ankle inversion    Ankle eversion    (Blank rows = not tested)    TRANSFERS: Assistive device utilized: Environmental consultant - 4 wheeled  Sit to stand: SBA Stand to sit: SBA Chair to chair: SBA Floor:  Unable to perform    STAIRS: Level of Assistance:  pt declines Stair Negotiation Technique: Step to Pattern with Bilateral Rails Number of Stairs: 4  Height of Stairs: 6  Comments: will need to assess.   GAIT: Gait pattern: step through pattern, Right foot flat, and lateral hip instability Distance walked: 267ft Assistive device utilized: Walker - 4 wheeled Level of assistance: Modified independence and SBA Comments: severe scoliosis.    FUNCTIONAL TESTS:  5 times sit to stand: 30.86 Timed up and go (TUG): 29.39 2 minute walk test: 211ft  10 meter walk test: 0.616m/s, SOB 4-5.  Berg Balance Scale: TBD  PATIENT SURVEYS:  FOTO 56. goal 64  TODAY'S TREATMENT:                                                                                                                              DATE:  02/13/24  Seated therex: LAQ 2# AW 1x 12, 1x14 each LE  Hip flexion 2# AW 1x14, 1x 12,  Weighted gait training with 2 #AW 2 x 150 to improve posture, step length, and improved  independence with functional daily activies & LE endurance, carido performance Hip abduction  GTB 2x 12 LE press down GTB 2x 12  HS curl GTB 2x 15, - Pt reports LLE felt better than RLE Isometric hip adduction 2x20   Seated shoulder horizontal abduction with trunk extension x 8, x  6 - pt reports tightness felt in shoulder bilat, but more pronounced on the R  STS 6x, 5x with UUE push off chair   Weighted gait training with 4WW x 450 ft to improve posture, step length, and improved independence with functional daily activies, LE endurance and cardio performance  STS 10x use of BUE  PATIENT EDUCATION: Education details: Pt educated throughout session about proper posture and technique with exercises. Improved exercise technique, movement at target joints, use of target muscles after min to mod verbal, visual, tactile cues.  Person educated: Patient Education method: Explanation Education comprehension: verbalized understanding and returned demonstration  HOME EXERCISE PROGRAM: Access Code: [email or web address] URL: [email or web address] Date: 09/25/2023 Prepared by: Grier Rocher  Exercises - Seated Hip Abduction with Resistance  - 1 x daily - 4 x  weekly - 3 sets - 15 reps - 3 hold - Seated March with Resistance  - 1 x daily - 4 x weekly - 3 sets - 10 reps - 1 hold - Seated Long Arc Quad  - 1 x daily - 4 x weekly - 3 sets - 12 reps - 3 hold - Seated Hip Adduction Isometrics with Ball  - 1 x daily - 4 x weekly - 3 sets - 12 reps - 3 hold - Standing March with Counter Support  - 1 x daily - 3 x weekly - 2 sets - 10 reps - Standing Hip Abduction with Counter Support  - 1 x daily - 3 x weekly - 2 sets - 10 reps - Standing Hip Extension with Counter Support  - 1 x daily - 3 x weekly - 2 sets - 10 reps - Mini Squat with Counter Support  - 1 x daily - 3 x weekly - 2 sets - 10 reps - Standing Knee Flexion with Counter Support  - 1 x daily - 3 x weekly - 2 sets - 10 reps - Standing Tandem  Balance with Counter Support  - 1 x daily - 3 x weekly - 2 sets - 4 reps - 10 hold  GOALS: Goals reviewed with patient? Yes   SHORT TERM GOALS: Target date: 12/28/23    Patient will be independent in home exercise program to improve strength/mobility for better functional independence with ADLs. Baseline:to be given at next session; 10/23/2023- Patient reports independence with HEP provided and no significant issues.   Goal status: MET 2. Pt will be able to ambulate for at least 5 min without stopping with LRAD to allow improved access to community and return to PLOF  Baseline: 3 minutes. 2/11: 6 minute walk test completed.   Goal Status: MET   . LONG TERM GOALS: Target date: 04/23/2024   Patient will increase FOTO score to equal to or greater than  61   to demonstrate statistically significant improvement in mobility and quality of life.  Baseline: 56; 10/23/2023= 60 Goal status: PROGRESSING/DISCONTINUED   2.  Patient (> 69 years old) will complete five times sit to stand test in < 15 seconds  without UE support indicating an increased LE strength and improved balance. Baseline: 30.86 sec; 10/23/2023= 22.08 using 1 UE support.  12/11: 14.67 sec BUE from arm rest. And 14.12 with 1 UE pushing from arm rest. 01/30/24 13 seconds with UE  Goal status: MET  3.  Patient will increase Berg Balance score to >45 points to demonstrate decreased fall risk during functional activities  Baseline: 26; 10/23/2023= 30 12/11: 34 01/30/24: 41/56 Goal status: MET/ Adjusted   4.  Patient will increase 10 meter walk test to >1.34m/s as to improve gait speed for better community ambulation and to reduce fall risk. Baseline: 0.648m/s; 10/23/2023= 0.88 m/s (normal speed with 4WW) 12/11: Average Fast speed: 1.05 m/s Average Normal speed: 0.834 m/s 01/30/24: 1.14 m/s Goal status: MET  5.  Patient will reduce timed up and go to <11 seconds to reduce fall risk and demonstrate improved transfer/gait  ability. Baseline: 29.39 12/2: 16.4 sec with Rollator  12/11: 15.19 sec with rollator  2/4: 17.38 sec with rollator  3/4: 15 sec with rollator   Goal status: IN PROGRESS  6.  Patient will 2 min walk test by at least 24ft as to demonstrate reduced fall risk and improved dynamic gait balance for better safety with community/home ambulation.   Baseline: 21ft;  10/23/2023= 360 feet  with 4WW 341ft with 4WW Goal status: MET  7. Pt will complete 6 min walk test without stopping and improve overall score by at least 168ft to indicate increased access to community.    Baseline: to be complete 2/11:880 ft with RW; 01/30/24: 1168 ft with RW  Goal: MET    ASSESSMENT: CLINICAL IMPRESSION:  Chartered loss adjuster continued plan as laid out in recent sessions. Pt able to demo progress by increasing number of reps with interventions of level of resistance used with interventions. She tolerated these progressions well without pain or significant fatigue. Pt reported to PT in session she plans to bring her [facility] next visit and would like to trial balance/gait with it in session. Pt will continue to benefit from skilled physical therapy intervention to address impairments, improve QOL, and attain therapy goals.       OBJECTIVE IMPAIRMENTS: Abnormal gait, cardiopulmonary status limiting activity, decreased activity tolerance, decreased balance, decreased endurance, decreased mobility, difficulty walking, decreased ROM, decreased strength, decreased safety awareness, hypomobility, increased fascial restrictions, impaired flexibility, improper body mechanics, and postural dysfunction.   ACTIVITY LIMITATIONS: lifting, standing, squatting, transfers, and locomotion level  PARTICIPATION LIMITATIONS: laundry, shopping, and community activity  PERSONAL FACTORS: 3+ comorbidities: scoliosis, hx of CA, and PE  are also affecting patient's functional outcome.   REHAB POTENTIAL: Good  CLINICAL DECISION MAKING:  Stable/uncomplicated  EVALUATION COMPLEXITY: Moderate  PLAN:  PT FREQUENCY: 1-2x/week  PT DURATION: 12 weeks  PLANNED INTERVENTIONS: Therapeutic exercises, Therapeutic activity, Neuromuscular re-education, Balance training, Gait training, Patient/Family education, Self Care, Joint mobilization, Stair training, Vestibular training, DME instructions, Dry Needling, Spinal mobilization, Cryotherapy, Moist heat, and Manual therapy  PLAN FOR NEXT SESSION:  Continue Dynamic balance and gait training. Progressing to higher level variable direction stepping and reaching tasks  BLE and postural strengthening. Continue plan    Temple Pacini PT, DPT  Physical Therapist - [facility]  2:58 PM 02/13/24

## 2024-02-15 ENCOUNTER — Ambulatory Visit: Payer: Medicare Other

## 2024-02-15 DIAGNOSIS — M6281 Muscle weakness (generalized): Secondary | ICD-10-CM | POA: Diagnosis not present

## 2024-02-15 DIAGNOSIS — R262 Difficulty in walking, not elsewhere classified: Secondary | ICD-10-CM

## 2024-02-15 DIAGNOSIS — R2681 Unsteadiness on feet: Secondary | ICD-10-CM

## 2024-02-15 NOTE — Therapy (Signed)
 OUTPATIENT PHYSICAL THERAPY NEURO TREATMENT NOTE    Patient Name: Kristina Rowe MRN: [id number] DOB:07-06-1930, 89 y.o., female Today's Date: 02/15/2024   PCP: Enid Baas, MD  REFERRING PROVIDER:   Ignacia Bayley, PA-C    END OF SESSION:   PT End of Session - 02/15/24 1104     Visit Number 28    Number of Visits 38    Date for PT Re-Evaluation 04/23/24    Progress Note Due on Visit 30    PT Start Time 1104    PT Stop Time 1145    PT Time Calculation (min) 41 min    Equipment Utilized During Treatment Gait belt    Activity Tolerance Patient tolerated treatment well;No increased pain    Behavior During Therapy WFL for tasks assessed/performed               Past Medical History:  Diagnosis Date   Ductal carcinoma in situ (DCIS) of left breast    Dysphagia    Esophageal stricture    Gastroparesis    GERD (gastroesophageal reflux disease)    Hiatal hernia    Hypertension    Iron deficiency anemia    Osteoporosis    Pulmonary embolism (HCC)    Scoliosis    UTI (urinary tract infection)    Past Surgical History:  Procedure Laterality Date   BREAST LUMPECTOMY Left 2009   Ductal Carcinoma insitu    CATARACT EXTRACTION, BILATERAL     COLONOSCOPY N/A 03/17/2021   Procedure: COLONOSCOPY;  Surgeon: Regis Bill, MD;  Location: [facility];  Service: Endoscopy;  Laterality: N/A;   COLOSTOMY REVISION Right 03/18/2021   Procedure: COLON RESECTION RIGHT;  Surgeon: Henrene Dodge, MD;  Location: [facility] ORS;  Service: General;  Laterality: Right;   LAPAROSCOPIC PARAESOPHAGEAL HERNIA REPAIR  2009   LAPAROTOMY N/A 03/10/2021   Procedure: EXPLORATORY LAPAROTOMY;  Surgeon: Henrene Dodge, MD;  Location: [facility] ORS;  Service: General;  Laterality: N/A;   TUBAL LIGATION     Patient Active Problem List   Diagnosis Date Noted   Acute on chronic diastolic CHF (congestive heart failure) (HCC) 08/19/2023   Acute respiratory failure with hypoxia (HCC) 08/17/2023    History of pulmonary embolus (PE) 08/17/2023   Basal cell carcinoma of leg 04/01/2021   Coronary atherosclerosis 04/01/2021   History of pulmonary embolism 04/01/2021   Pulmonary nodule 04/01/2021   Malignant neoplasm of colon (HCC) 03/29/2021   Rectal bleeding 03/15/2021   Hypertension    GERD (gastroesophageal reflux disease)    Colonic mass    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HCC) 03/10/2021   Anemia, unspecified 03/06/2021   Bronchiectasis without complication (HCC) 03/27/2020   Iron deficiency anemia 03/27/2020   Moderate aortic stenosis 03/27/2020   Stage 3a chronic kidney disease (HCC) 07/04/2019   Hypnic headache 05/28/2019   Telogen effluvium 04/17/2019   Xerosis cutis 04/17/2019   Essential hypertension 01/14/2019   Cervical radiculopathy 05/04/2018   Osteoarthritis of left glenohumeral joint 05/04/2018   Vitamin B12 deficiency 01/01/2018   Vascular abnormality 09/11/2017   Dyspepsia 03/28/2017   Pulmonary embolism (HCC) 08/16/2016   Dilated pore of [place] 03/23/2015   Retinal drusen of both eyes 08/28/2014   Status post right cataract extraction 07/30/2014   Verruca 03/19/2014   Chronic back pain 11/06/2013   Postmenopausal osteoporosis 06/11/2013   Anticoagulated on Coumadin 04/06/2013   Long term (current) use of anticoagulants 10/18/2012   Preglaucoma 05/10/2012   Presbyopia 05/10/2012   Senile nuclear sclerosis 05/10/2012  Lobular carcinoma in situ of breast 04/13/2012   Closed fracture of ankle 02/27/2012   History of nonmelanoma skin cancer 09/05/2011   Other benign neoplasm of skin of trunk 09/05/2011   Osteoporosis 08/25/2011   Transient alteration of awareness 09/03/2009   Colon adenoma 07/30/2002    ONSET DATE: 3 months   REFERRING DIAG:  Diagnosis  R53.83 (ICD-10-CM) - Other fatigue    THERAPY DIAG:  Muscle weakness (generalized)  Difficulty in walking, not elsewhere classified  Unsteadiness on feet  Rationale for Evaluation and Treatment:  Rehabilitation  SUBJECTIVE:                                                                                                                                                                                             SUBJECTIVE STATEMENT:  Pt reports she was tired and sore following last visit. She is still feeling it a little bit sore otday, particularly between her shoulders.  Pt has brought her [facility] today for gait assessment.  Pt accompanied by: self  PERTINENT HISTORY:  From recent MD visit.  89 y.o. here for an acute issue. She has a PMH of chronic PE/Coumadin, anemia, colon cancer, breast cancer, large hiatal hernia, moderate aortic stenosis who has concerns about shortness of breath, nausea, generalized weakness. No chest pain. History of hiatal hernia and recent CT chest. Also recent endoscopy. Takes Zofran chronically. Has felt weaker and almost fell recently. Uses a walker. Also on chronic UTI suppression, Keflex.   PAIN:  Are you having pain? Yes: NPRS scale: 5/10 Pain location: low back  Pain description: sore  Aggravating factors: movement  Relieving factors: rest   PRECAUTIONS: Fall  RED FLAGS: None   WEIGHT BEARING RESTRICTIONS: No  FALLS: Has patient fallen in last 6 months? No  LIVING ENVIRONMENT: Lives with: lives alone Lives in: House/apartment Stairs: Yes: External: 1 steps; none Has following equipment at home: Walker - 4 wheeled  PLOF: Independent with household mobility with device and Independent with community mobility with device  PATIENT GOALS: improved strength in BLE   OBJECTIVE:   DIAGNOSTIC FINDINGS:  06/12/2023 EXAM: CT CHEST, ABDOMEN, AND PELVIS WITH CONTRAST IMPRESSION: 1. Status post right hemicolectomy, without recurrent or metastatic disease. Decreased sensitivity exam, primarily involving the abdomen pelvis, due to paucity of fat. 2. New tiny left groin hernia containing fluid. Consider physical exam correlation. 3. Incidental  findings, including: Coronary artery atherosclerosis. Aortic Atherosclerosis (ICD10-I70.0). Dilated esophagus with contrast throughout, suggesting dysmotility and/or gastroesophageal reflux.  COGNITION: Overall cognitive status: Within functional limits for tasks assessed   SENSATION: Light touch: Impaired  mild paraesthesia in the proximal  RLE   COORDINATION: WFL. Ankle to knee limited ROM due to weakness in the R hip      POSTURE: rounded shoulders, forward head, flexed trunk , and severe scoliosis     LOWER EXTREMITY MMT:    MMT Right Eval Left Eval  Hip flexion 4- 4  Hip extension    Hip abduction 4- 44  Hip adduction 4 4  Hip internal rotation    Hip external rotation    Knee flexion 4- 4  Knee extension 4 4+  Ankle dorsiflexion 4 4+  Ankle plantarflexion    Ankle inversion    Ankle eversion    (Blank rows = not tested)    TRANSFERS: Assistive device utilized: Environmental consultant - 4 wheeled  Sit to stand: SBA Stand to sit: SBA Chair to chair: SBA Floor:  Unable to perform    STAIRS: Level of Assistance:  pt declines Stair Negotiation Technique: Step to Pattern with Bilateral Rails Number of Stairs: 4  Height of Stairs: 6  Comments: will need to assess.   GAIT: Gait pattern: step through pattern, Right foot flat, and lateral hip instability Distance walked: 277ft Assistive device utilized: Walker - 4 wheeled Level of assistance: Modified independence and SBA Comments: severe scoliosis.    FUNCTIONAL TESTS:  5 times sit to stand: 30.86 Timed up and go (TUG): 29.39 2 minute walk test: 239ft  10 meter walk test: 0.626m/s, SOB 4-5.  Berg Balance Scale: TBD  PATIENT SURVEYS:  FOTO 56. goal 64  TODAY'S TREATMENT:                                                                                                                              DATE:  02/15/24   Gait: Assessment of gait with SPC x 148 ft even surface and CGA - no LOB throughout no decrease in  postural stability   NMR: Obstacle course: Ambulation with SPC onto and off of firm mat with slight slope, over half-bolster, around 3 cones and completing additional 148 ft lap 3x through. Fatiguing, no LOB, but hesitance with obstacle clearance and use of close CGA. PT instructs pt in safe technique with SPC for obstacle clearance, if she attempts to use cane for short distance at restaurant have someone with her/next to her, make sure environment not too crowded, bring walker just in case you feel very fatigued or restaurant is busy. Pt otherwise exhibits good balance with SPC today.  TE: Seated therex: no weights LAQ 2x 12 each LE - moderate  Hip flexion 2x 12 Hip abduction  GTB 2x 12 LE press down GTB 2x 12 HS curl GTB 2x 15, - Fatiguing, more on RLE  Isometric hip adduction 2x15 - rates easy   Seated shoulder horizontal abduction with trunk extension 2x6   TA: STS 6x, 4x with BUE push off chair  - pt rates moderate   Endurance training ambulation with 4WW x 450 ft to improve endurance, posture,  step length, and improved independence with functional daily activities  PATIENT EDUCATION: Education details: Pt educated throughout session about proper posture and technique with exercises. Improved exercise technique, movement at target joints, use of target muscles after min to mod verbal, visual, tactile cues.  Person educated: Patient Education method: Explanation Education comprehension: verbalized understanding and returned demonstration  HOME EXERCISE PROGRAM: Access Code: [email or web address] URL: [email or web address] Date: 09/25/2023 Prepared by: Grier Rocher  Exercises - Seated Hip Abduction with Resistance  - 1 x daily - 4 x weekly - 3 sets - 15 reps - 3 hold - Seated March with Resistance  - 1 x daily - 4 x weekly - 3 sets - 10 reps - 1 hold - Seated Long Arc Quad  - 1 x daily - 4 x weekly - 3 sets - 12 reps - 3 hold - Seated Hip Adduction Isometrics with Ball  -  1 x daily - 4 x weekly - 3 sets - 12 reps - 3 hold - Standing March with Counter Support  - 1 x daily - 3 x weekly - 2 sets - 10 reps - Standing Hip Abduction with Counter Support  - 1 x daily - 3 x weekly - 2 sets - 10 reps - Standing Hip Extension with Counter Support  - 1 x daily - 3 x weekly - 2 sets - 10 reps - Mini Squat with Counter Support  - 1 x daily - 3 x weekly - 2 sets - 10 reps - Standing Knee Flexion with Counter Support  - 1 x daily - 3 x weekly - 2 sets - 10 reps - Standing Tandem Balance with Counter Support  - 1 x daily - 3 x weekly - 2 sets - 4 reps - 10 hold  GOALS: Goals reviewed with patient? Yes   SHORT TERM GOALS: Target date: 12/28/23    Patient will be independent in home exercise program to improve strength/mobility for better functional independence with ADLs. Baseline:to be given at next session; 10/23/2023- Patient reports independence with HEP provided and no significant issues.   Goal status: MET 2. Pt will be able to ambulate for at least 5 min without stopping with LRAD to allow improved access to community and return to PLOF  Baseline: 3 minutes. 2/11: 6 minute walk test completed.   Goal Status: MET   . LONG TERM GOALS: Target date: 04/23/2024   Patient will increase FOTO score to equal to or greater than  61   to demonstrate statistically significant improvement in mobility and quality of life.  Baseline: 56; 10/23/2023= 60 Goal status: PROGRESSING/DISCONTINUED   2.  Patient (> 38 years old) will complete five times sit to stand test in < 15 seconds  without UE support indicating an increased LE strength and improved balance. Baseline: 30.86 sec; 10/23/2023= 22.08 using 1 UE support.  12/11: 14.67 sec BUE from arm rest. And 14.12 with 1 UE pushing from arm rest. 01/30/24 13 seconds with UE  Goal status: MET  3.  Patient will increase Berg Balance score to >45 points to demonstrate decreased fall risk during functional activities  Baseline: 26;  10/23/2023= 30 12/11: 34 01/30/24: 41/56 Goal status: MET/ Adjusted   4.  Patient will increase 10 meter walk test to >1.6m/s as to improve gait speed for better community ambulation and to reduce fall risk. Baseline: 0.671m/s; 10/23/2023= 0.88 m/s (normal speed with 4WW) 12/11: Average Fast speed: 1.05 m/s Average Normal speed: 0.834 m/s 01/30/24: 1.14 m/s Goal  status: MET  5.  Patient will reduce timed up and go to <11 seconds to reduce fall risk and demonstrate improved transfer/gait ability. Baseline: 29.39 12/2: 16.4 sec with Rollator  12/11: 15.19 sec with rollator  2/4: 17.38 sec with rollator  3/4: 15 sec with rollator   Goal status: IN PROGRESS  6.  Patient will 2 min walk test by at least 45ft as to demonstrate reduced fall risk and improved dynamic gait balance for better safety with community/home ambulation.   Baseline: 267ft; 10/23/2023= 360 feet  with 4WW 361ft with 4WW Goal status: MET  7. Pt will complete 6 min walk test without stopping and improve overall score by at least 129ft to indicate increased access to community.    Baseline: to be complete 2/11:880 ft with RW; 01/30/24: 1168 ft with RW  Goal: MET    ASSESSMENT: CLINICAL IMPRESSION:  Pt brings in [facility] today for gait assessment for short distances  (such as ambulating into a restaurant). Pt did exhibit generally good balance with SPC throughout, only slight difficulty with obstacle clearance. PT provided instruction in safe use of SPC for short distance outside of therapy, have someone with you (see above for details). Pt will continue to benefit from skilled physical therapy intervention to address impairments, improve QOL, and attain therapy goals.       OBJECTIVE IMPAIRMENTS: Abnormal gait, cardiopulmonary status limiting activity, decreased activity tolerance, decreased balance, decreased endurance, decreased mobility, difficulty walking, decreased ROM, decreased strength, decreased safety awareness,  hypomobility, increased fascial restrictions, impaired flexibility, improper body mechanics, and postural dysfunction.   ACTIVITY LIMITATIONS: lifting, standing, squatting, transfers, and locomotion level  PARTICIPATION LIMITATIONS: laundry, shopping, and community activity  PERSONAL FACTORS: 3+ comorbidities: scoliosis, hx of CA, and PE  are also affecting patient's functional outcome.   REHAB POTENTIAL: Good  CLINICAL DECISION MAKING: Stable/uncomplicated  EVALUATION COMPLEXITY: Moderate  PLAN:  PT FREQUENCY: 1-2x/week  PT DURATION: 12 weeks  PLANNED INTERVENTIONS: Therapeutic exercises, Therapeutic activity, Neuromuscular re-education, Balance training, Gait training, Patient/Family education, Self Care, Joint mobilization, Stair training, Vestibular training, DME instructions, Dry Needling, Spinal mobilization, Cryotherapy, Moist heat, and Manual therapy  PLAN FOR NEXT SESSION:  Continue Dynamic balance and gait training. Progressing to higher level variable direction stepping and reaching tasks  BLE and postural strengthening. Continue plan    Temple Pacini PT, DPT  Physical Therapist - [facility]  2:24 PM 02/15/24

## 2024-02-19 ENCOUNTER — Ambulatory Visit: Payer: Medicare Other

## 2024-02-19 DIAGNOSIS — M6281 Muscle weakness (generalized): Secondary | ICD-10-CM | POA: Diagnosis not present

## 2024-02-19 DIAGNOSIS — R2681 Unsteadiness on feet: Secondary | ICD-10-CM

## 2024-02-19 DIAGNOSIS — R262 Difficulty in walking, not elsewhere classified: Secondary | ICD-10-CM

## 2024-02-19 NOTE — Therapy (Signed)
 OUTPATIENT PHYSICAL THERAPY NEURO TREATMENT NOTE    Patient Name: Kristina Rowe MRN: [id number] DOB:Sep 11, 1930, 89 y.o., female Today's Date: 02/19/2024   PCP: Enid Baas, MD  REFERRING PROVIDER:   Ignacia Bayley, PA-C    END OF SESSION:   PT End of Session - 02/19/24 1202     Visit Number 29    Number of Visits 38    Date for PT Re-Evaluation 04/23/24    Progress Note Due on Visit 30    PT Start Time 1202    PT Stop Time 1230    PT Time Calculation (min) 28 min    Equipment Utilized During Treatment Gait belt    Activity Tolerance Patient tolerated treatment well;No increased pain    Behavior During Therapy WFL for tasks assessed/performed                Past Medical History:  Diagnosis Date   Ductal carcinoma in situ (DCIS) of left breast    Dysphagia    Esophageal stricture    Gastroparesis    GERD (gastroesophageal reflux disease)    Hiatal hernia    Hypertension    Iron deficiency anemia    Osteoporosis    Pulmonary embolism (HCC)    Scoliosis    UTI (urinary tract infection)    Past Surgical History:  Procedure Laterality Date   BREAST LUMPECTOMY Left 2009   Ductal Carcinoma insitu    CATARACT EXTRACTION, BILATERAL     COLONOSCOPY N/A 03/17/2021   Procedure: COLONOSCOPY;  Surgeon: Regis Bill, MD;  Location: [facility];  Service: Endoscopy;  Laterality: N/A;   COLOSTOMY REVISION Right 03/18/2021   Procedure: COLON RESECTION RIGHT;  Surgeon: Henrene Dodge, MD;  Location: [facility];  Service: General;  Laterality: Right;   LAPAROSCOPIC PARAESOPHAGEAL HERNIA REPAIR  2009   LAPAROTOMY N/A 03/10/2021   Procedure: EXPLORATORY LAPAROTOMY;  Surgeon: Henrene Dodge, MD;  Location: [facility];  Service: General;  Laterality: N/A;   TUBAL LIGATION     Patient Active Problem List   Diagnosis Date Noted   Acute on chronic diastolic CHF (congestive heart failure) (HCC) 08/19/2023   Acute respiratory failure with hypoxia (HCC) 08/17/2023    History of pulmonary embolus (PE) 08/17/2023   Basal cell carcinoma of leg 04/01/2021   Coronary atherosclerosis 04/01/2021   History of pulmonary embolism 04/01/2021   Pulmonary nodule 04/01/2021   Malignant neoplasm of colon (HCC) 03/29/2021   Rectal bleeding 03/15/2021   Hypertension    GERD (gastroesophageal reflux disease)    Colonic mass    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HCC) 03/10/2021   Anemia, unspecified 03/06/2021   Bronchiectasis without complication (HCC) 03/27/2020   Iron deficiency anemia 03/27/2020   Moderate aortic stenosis 03/27/2020   Stage 3a chronic kidney disease (HCC) 07/04/2019   Hypnic headache 05/28/2019   Telogen effluvium 04/17/2019   Xerosis cutis 04/17/2019   Essential hypertension 01/14/2019   Cervical radiculopathy 05/04/2018   Osteoarthritis of left glenohumeral joint 05/04/2018   Vitamin B12 deficiency 01/01/2018   Vascular abnormality 09/11/2017   Dyspepsia 03/28/2017   Pulmonary embolism (HCC) 08/16/2016   Dilated pore of [place] 03/23/2015   Retinal drusen of both eyes 08/28/2014   Status post right cataract extraction 07/30/2014   Verruca 03/19/2014   Chronic back pain 11/06/2013   Postmenopausal osteoporosis 06/11/2013   Anticoagulated on Coumadin 04/06/2013   Long term (current) use of anticoagulants 10/18/2012   Preglaucoma 05/10/2012   Presbyopia 05/10/2012   Senile nuclear sclerosis 05/10/2012  Lobular carcinoma in situ of breast 04/13/2012   Closed fracture of ankle 02/27/2012   History of nonmelanoma skin cancer 09/05/2011   Other benign neoplasm of skin of trunk 09/05/2011   Osteoporosis 08/25/2011   Transient alteration of awareness 09/03/2009   Colon adenoma 07/30/2002    ONSET DATE: 3 months   REFERRING DIAG:  Diagnosis  R53.83 (ICD-10-CM) - Other fatigue    THERAPY DIAG:  Difficulty in walking, not elsewhere classified  Muscle weakness (generalized)  Unsteadiness on feet  Rationale for Evaluation and Treatment:  Rehabilitation  SUBJECTIVE:                                                                                                                                                                                             SUBJECTIVE STATEMENT:  Pt did well following last visit. She reports no soreness. She reports no stumbles/falls, got a chance to use her cane while out and about. Still not fully confident.  Pt accompanied by: self  PERTINENT HISTORY:  From recent MD visit.  89 y.o. here for an acute issue. She has a PMH of chronic PE/Coumadin, anemia, colon cancer, breast cancer, large hiatal hernia, moderate aortic stenosis who has concerns about shortness of breath, nausea, generalized weakness. No chest pain. History of hiatal hernia and recent CT chest. Also recent endoscopy. Takes Zofran chronically. Has felt weaker and almost fell recently. Uses a walker. Also on chronic UTI suppression, Keflex.   PAIN:  Are you having pain? Yes: NPRS scale: 5/10 Pain location: low back  Pain description: sore  Aggravating factors: movement  Relieving factors: rest   PRECAUTIONS: Fall  RED FLAGS: None   WEIGHT BEARING RESTRICTIONS: No  FALLS: Has patient fallen in last 6 months? No  LIVING ENVIRONMENT: Lives with: lives alone Lives in: House/apartment Stairs: Yes: External: 1 steps; none Has following equipment at home: Walker - 4 wheeled  PLOF: Independent with household mobility with device and Independent with community mobility with device  PATIENT GOALS: improved strength in BLE   OBJECTIVE:   DIAGNOSTIC FINDINGS:  06/12/2023 EXAM: CT CHEST, ABDOMEN, AND PELVIS WITH CONTRAST IMPRESSION: 1. Status post right hemicolectomy, without recurrent or metastatic disease. Decreased sensitivity exam, primarily involving the abdomen pelvis, due to paucity of fat. 2. New tiny left groin hernia containing fluid. Consider physical exam correlation. 3. Incidental findings, including: Coronary  artery atherosclerosis. Aortic Atherosclerosis (ICD10-I70.0). Dilated esophagus with contrast throughout, suggesting dysmotility and/or gastroesophageal reflux.  COGNITION: Overall cognitive status: Within functional limits for tasks assessed   SENSATION: Light touch: Impaired  mild paraesthesia in the proximal RLE   COORDINATION: WFL.  Ankle to knee limited ROM due to weakness in the R hip      POSTURE: rounded shoulders, forward head, flexed trunk , and severe scoliosis     LOWER EXTREMITY MMT:    MMT Right Eval Left Eval  Hip flexion 4- 4  Hip extension    Hip abduction 4- 44  Hip adduction 4 4  Hip internal rotation    Hip external rotation    Knee flexion 4- 4  Knee extension 4 4+  Ankle dorsiflexion 4 4+  Ankle plantarflexion    Ankle inversion    Ankle eversion    (Blank rows = not tested)    TRANSFERS: Assistive device utilized: Environmental consultant - 4 wheeled  Sit to stand: SBA Stand to sit: SBA Chair to chair: SBA Floor:  Unable to perform    STAIRS: Level of Assistance:  pt declines Stair Negotiation Technique: Step to Pattern with Bilateral Rails Number of Stairs: 4  Height of Stairs: 6  Comments: will need to assess.   GAIT: Gait pattern: step through pattern, Right foot flat, and lateral hip instability Distance walked: 214ft Assistive device utilized: Walker - 4 wheeled Level of assistance: Modified independence and SBA Comments: severe scoliosis.    FUNCTIONAL TESTS:  5 times sit to stand: 30.86 Timed up and go (TUG): 29.39 2 minute walk test: 244ft  10 meter walk test: 0.623m/s, SOB 4-5.  Berg Balance Scale: TBD  PATIENT SURVEYS:  FOTO 56. goal 64  TODAY'S TREATMENT:                                                                                                                              DATE:  02/19/24   TE: 2# aw each LE  LAQ 2x 10 each LE -  Hip flexion 2x 10 each LE  Seated step-overs 2x15 each side   STS 1x10, 1x8 with use  of UE   NMR: Gait for dynamic balance with SPC x 496 ft close CGA. No LOB but pt fatigued and request  seated rest break, otherwise pt reports feeling pleased with her balance. Did attempt with 2# weights, but this was too challenging. -- pt repeats another 296 ft with [facility]     PATIENT EDUCATION: Education details: Pt educated throughout session about proper posture and technique with exercises. Improved exercise technique, movement at target joints, use of target muscles after min to mod verbal, visual, tactile cues.  Person educated: Patient Education method: Explanation Education comprehension: verbalized understanding and returned demonstration  HOME EXERCISE PROGRAM: Access Code: [email or web address] URL: [email or web address] Date: 09/25/2023 Prepared by: Grier Rocher  Exercises - Seated Hip Abduction with Resistance  - 1 x daily - 4 x weekly - 3 sets - 15 reps - 3 hold - Seated March with Resistance  - 1 x daily - 4 x weekly - 3 sets - 10 reps - 1 hold - Seated Long Arc Quad  - 1 x daily - 4  x weekly - 3 sets - 12 reps - 3 hold - Seated Hip Adduction Isometrics with Ball  - 1 x daily - 4 x weekly - 3 sets - 12 reps - 3 hold - Standing March with Counter Support  - 1 x daily - 3 x weekly - 2 sets - 10 reps - Standing Hip Abduction with Counter Support  - 1 x daily - 3 x weekly - 2 sets - 10 reps - Standing Hip Extension with Counter Support  - 1 x daily - 3 x weekly - 2 sets - 10 reps - Mini Squat with Counter Support  - 1 x daily - 3 x weekly - 2 sets - 10 reps - Standing Knee Flexion with Counter Support  - 1 x daily - 3 x weekly - 2 sets - 10 reps - Standing Tandem Balance with Counter Support  - 1 x daily - 3 x weekly - 2 sets - 4 reps - 10 hold  GOALS: Goals reviewed with patient? Yes   SHORT TERM GOALS: Target date: 12/28/23    Patient will be independent in home exercise program to improve strength/mobility for better functional independence with  ADLs. Baseline:to be given at next session; 10/23/2023- Patient reports independence with HEP provided and no significant issues.   Goal status: MET 2. Pt will be able to ambulate for at least 5 min without stopping with LRAD to allow improved access to community and return to PLOF  Baseline: 3 minutes. 2/11: 6 minute walk test completed.   Goal Status: MET   . LONG TERM GOALS: Target date: 04/23/2024   Patient will increase FOTO score to equal to or greater than  61   to demonstrate statistically significant improvement in mobility and quality of life.  Baseline: 56; 10/23/2023= 60 Goal status: PROGRESSING/DISCONTINUED   2.  Patient (> 57 years old) will complete five times sit to stand test in < 15 seconds  without UE support indicating an increased LE strength and improved balance. Baseline: 30.86 sec; 10/23/2023= 22.08 using 1 UE support.  12/11: 14.67 sec BUE from arm rest. And 14.12 with 1 UE pushing from arm rest. 01/30/24 13 seconds with UE  Goal status: MET  3.  Patient will increase Berg Balance score to >45 points to demonstrate decreased fall risk during functional activities  Baseline: 26; 10/23/2023= 30 12/11: 34 01/30/24: 41/56 Goal status: MET/ Adjusted   4.  Patient will increase 10 meter walk test to >1.42m/s as to improve gait speed for better community ambulation and to reduce fall risk. Baseline: 0.636m/s; 10/23/2023= 0.88 m/s (normal speed with 4WW) 12/11: Average Fast speed: 1.05 m/s Average Normal speed: 0.834 m/s 01/30/24: 1.14 m/s Goal status: MET  5.  Patient will reduce timed up and go to <11 seconds to reduce fall risk and demonstrate improved transfer/gait ability. Baseline: 29.39 12/2: 16.4 sec with Rollator  12/11: 15.19 sec with rollator  2/4: 17.38 sec with rollator  3/4: 15 sec with rollator   Goal status: IN PROGRESS  6.  Patient will 2 min walk test by at least 74ft as to demonstrate reduced fall risk and improved dynamic gait balance for better  safety with community/home ambulation.   Baseline: 256ft; 10/23/2023= 360 feet  with 4WW 325ft with 4WW Goal status: MET  7. Pt will complete 6 min walk test without stopping and improve overall score by at least 13ft to indicate increased access to community.    Baseline: to be complete 2/11:880  ft with RW; 01/30/24: 1168 ft with RW  Goal: MET    ASSESSMENT: CLINICAL IMPRESSION:  Session somewhat limited secondary to pt late arrival, however, pt highly motivated to participate. She was able to ambulate for several laps with her SPC without any decrease in balance, reported she did try this outside of PT successfully, but not yet fully confident. Attempt to progress with weights, but this was still too challenging & weight were removed at this time. Pt will continue to benefit from skilled physical therapy intervention to address impairments, improve QOL, and attain therapy goals.       OBJECTIVE IMPAIRMENTS: Abnormal gait, cardiopulmonary status limiting activity, decreased activity tolerance, decreased balance, decreased endurance, decreased mobility, difficulty walking, decreased ROM, decreased strength, decreased safety awareness, hypomobility, increased fascial restrictions, impaired flexibility, improper body mechanics, and postural dysfunction.   ACTIVITY LIMITATIONS: lifting, standing, squatting, transfers, and locomotion level  PARTICIPATION LIMITATIONS: laundry, shopping, and community activity  PERSONAL FACTORS: 3+ comorbidities: scoliosis, hx of CA, and PE  are also affecting patient's functional outcome.   REHAB POTENTIAL: Good  CLINICAL DECISION MAKING: Stable/uncomplicated  EVALUATION COMPLEXITY: Moderate  PLAN:  PT FREQUENCY: 1-2x/week  PT DURATION: 12 weeks  PLANNED INTERVENTIONS: Therapeutic exercises, Therapeutic activity, Neuromuscular re-education, Balance training, Gait training, Patient/Family education, Self Care, Joint mobilization, Stair training,  Vestibular training, DME instructions, Dry Needling, Spinal mobilization, Cryotherapy, Moist heat, and Manual therapy  PLAN FOR NEXT SESSION:  Continue Dynamic balance and gait training. Progressing to higher level variable direction stepping and reaching tasks  BLE and postural strengthening. Continue plan    Temple Pacini PT, DPT  Physical Therapist - [facility]  5:20 PM 02/19/24

## 2024-02-20 ENCOUNTER — Ambulatory Visit: Payer: Medicare Other

## 2024-02-21 ENCOUNTER — Ambulatory Visit: Payer: Medicare Other | Admitting: Physical Therapy

## 2024-02-21 DIAGNOSIS — R2689 Other abnormalities of gait and mobility: Secondary | ICD-10-CM

## 2024-02-21 DIAGNOSIS — R2681 Unsteadiness on feet: Secondary | ICD-10-CM

## 2024-02-21 DIAGNOSIS — R262 Difficulty in walking, not elsewhere classified: Secondary | ICD-10-CM

## 2024-02-21 DIAGNOSIS — M5459 Other low back pain: Secondary | ICD-10-CM

## 2024-02-21 DIAGNOSIS — R278 Other lack of coordination: Secondary | ICD-10-CM

## 2024-02-21 DIAGNOSIS — M6281 Muscle weakness (generalized): Secondary | ICD-10-CM | POA: Diagnosis not present

## 2024-02-21 NOTE — Therapy (Unsigned)
 OUTPATIENT PHYSICAL THERAPY NEURO TREATMENT NOTE/  PHYSICAL THERAPY PROGRESS NOTE   Dates of reporting period  01/02/2024   to   02/21/2024      Patient Name: Kristina Rowe MRN: [id number] DOB:14-Aug-1930, 89 y.o., female Today's Date: 02/21/2024   PCP: Enid Baas, MD  REFERRING PROVIDER:   Ignacia Bayley, PA-C    END OF SESSION:   PT End of Session - 02/21/24 1403     Visit Number 30    Number of Visits 38    Date for PT Re-Evaluation 04/23/24    Progress Note Due on Visit 30    PT Start Time 1402    PT Stop Time 1442    PT Time Calculation (min) 40 min    Equipment Utilized During Treatment Gait belt    Activity Tolerance Patient tolerated treatment well;No increased pain    Behavior During Therapy WFL for tasks assessed/performed                Past Medical History:  Diagnosis Date   Ductal carcinoma in situ (DCIS) of left breast    Dysphagia    Esophageal stricture    Gastroparesis    GERD (gastroesophageal reflux disease)    Hiatal hernia    Hypertension    Iron deficiency anemia    Osteoporosis    Pulmonary embolism (HCC)    Scoliosis    UTI (urinary tract infection)    Past Surgical History:  Procedure Laterality Date   BREAST LUMPECTOMY Left 2009   Ductal Carcinoma insitu    CATARACT EXTRACTION, BILATERAL     COLONOSCOPY N/A 03/17/2021   Procedure: COLONOSCOPY;  Surgeon: Regis Bill, MD;  Location: [facility];  Service: Endoscopy;  Laterality: N/A;   COLOSTOMY REVISION Right 03/18/2021   Procedure: COLON RESECTION RIGHT;  Surgeon: Henrene Dodge, MD;  Location: [facility];  Service: General;  Laterality: Right;   LAPAROSCOPIC PARAESOPHAGEAL HERNIA REPAIR  2009   LAPAROTOMY N/A 03/10/2021   Procedure: EXPLORATORY LAPAROTOMY;  Surgeon: Henrene Dodge, MD;  Location: [facility];  Service: General;  Laterality: N/A;   TUBAL LIGATION     Patient Active Problem List   Diagnosis Date Noted   Acute on chronic diastolic CHF (congestive  heart failure) (HCC) 08/19/2023   Acute respiratory failure with hypoxia (HCC) 08/17/2023   History of pulmonary embolus (PE) 08/17/2023   Basal cell carcinoma of leg 04/01/2021   Coronary atherosclerosis 04/01/2021   History of pulmonary embolism 04/01/2021   Pulmonary nodule 04/01/2021   Malignant neoplasm of colon (HCC) 03/29/2021   Rectal bleeding 03/15/2021   Hypertension    GERD (gastroesophageal reflux disease)    Colonic mass    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HCC) 03/10/2021   Anemia, unspecified 03/06/2021   Bronchiectasis without complication (HCC) 03/27/2020   Iron deficiency anemia 03/27/2020   Moderate aortic stenosis 03/27/2020   Stage 3a chronic kidney disease (HCC) 07/04/2019   Hypnic headache 05/28/2019   Telogen effluvium 04/17/2019   Xerosis cutis 04/17/2019   Essential hypertension 01/14/2019   Cervical radiculopathy 05/04/2018   Osteoarthritis of left glenohumeral joint 05/04/2018   Vitamin B12 deficiency 01/01/2018   Vascular abnormality 09/11/2017   Dyspepsia 03/28/2017   Pulmonary embolism (HCC) 08/16/2016   Dilated pore of [place] 03/23/2015   Retinal drusen of both eyes 08/28/2014   Status post right cataract extraction 07/30/2014   Verruca 03/19/2014   Chronic back pain 11/06/2013   Postmenopausal osteoporosis 06/11/2013   Anticoagulated on Coumadin 04/06/2013   Long  term (current) use of anticoagulants 10/18/2012   Preglaucoma 05/10/2012   Presbyopia 05/10/2012   Senile nuclear sclerosis 05/10/2012   Lobular carcinoma in situ of breast 04/13/2012   Closed fracture of ankle 02/27/2012   History of nonmelanoma skin cancer 09/05/2011   Other benign neoplasm of skin of trunk 09/05/2011   Osteoporosis 08/25/2011   Transient alteration of awareness 09/03/2009   Colon adenoma 07/30/2002    ONSET DATE: 3 months   REFERRING DIAG:  Diagnosis  R53.83 (ICD-10-CM) - Other fatigue    THERAPY DIAG:  Difficulty in walking, not elsewhere classified  Muscle  weakness (generalized)  Unsteadiness on feet  Other abnormalities of gait and mobility  Other low back pain  Other lack of coordination  Rationale for Evaluation and Treatment: Rehabilitation  SUBJECTIVE:                                                                                                                                                                                             SUBJECTIVE STATEMENT:  Pt did well following last visit. States that she enjoyed walking with cane in her last session, but does not feel confident to attempt in the community.   Pt accompanied by: self  PERTINENT HISTORY:  From recent MD visit.  89 y.o. here for an acute issue. She has a PMH of chronic PE/Coumadin, anemia, colon cancer, breast cancer, large hiatal hernia, moderate aortic stenosis who has concerns about shortness of breath, nausea, generalized weakness. No chest pain. History of hiatal hernia and recent CT chest. Also recent endoscopy. Takes Zofran chronically. Has felt weaker and almost fell recently. Uses a walker. Also on chronic UTI suppression, Keflex.   PAIN:  Are you having pain? Yes: NPRS scale: 5/10 Pain location: low back  Pain description: sore  Aggravating factors: movement  Relieving factors: rest   PRECAUTIONS: Fall  RED FLAGS: None   WEIGHT BEARING RESTRICTIONS: No  FALLS: Has patient fallen in last 6 months? No  LIVING ENVIRONMENT: Lives with: lives alone Lives in: House/apartment Stairs: Yes: External: 1 steps; none Has following equipment at home: Walker - 4 wheeled  PLOF: Independent with household mobility with device and Independent with community mobility with device  PATIENT GOALS: improved strength in BLE   OBJECTIVE:   DIAGNOSTIC FINDINGS:  06/12/2023 EXAM: CT CHEST, ABDOMEN, AND PELVIS WITH CONTRAST IMPRESSION: 1. Status post right hemicolectomy, without recurrent or metastatic disease. Decreased sensitivity exam, primarily involving  the abdomen pelvis, due to paucity of fat. 2. New tiny left groin hernia containing fluid. Consider physical exam correlation. 3. Incidental findings, including: Coronary artery atherosclerosis. Aortic Atherosclerosis (ICD10-I70.0).  Dilated esophagus with contrast throughout, suggesting dysmotility and/or gastroesophageal reflux.  COGNITION: Overall cognitive status: Within functional limits for tasks assessed   SENSATION: Light touch: Impaired  mild paraesthesia in the proximal RLE   COORDINATION: WFL. Ankle to knee limited ROM due to weakness in the R hip      POSTURE: rounded shoulders, forward head, flexed trunk , and severe scoliosis     LOWER EXTREMITY MMT:    MMT Right Eval Left Eval  Hip flexion 4- 4  Hip extension    Hip abduction 4- 44  Hip adduction 4 4  Hip internal rotation    Hip external rotation    Knee flexion 4- 4  Knee extension 4 4+  Ankle dorsiflexion 4 4+  Ankle plantarflexion    Ankle inversion    Ankle eversion    (Blank rows = not tested)    TRANSFERS: Assistive device utilized: Environmental consultant - 4 wheeled  Sit to stand: SBA Stand to sit: SBA Chair to chair: SBA Floor:  Unable to perform    STAIRS: Level of Assistance:  pt declines Stair Negotiation Technique: Step to Pattern with Bilateral Rails Number of Stairs: 4  Height of Stairs: 6  Comments: will need to assess.   GAIT: Gait pattern: step through pattern, Right foot flat, and lateral hip instability Distance walked: 239ft Assistive device utilized: Walker - 4 wheeled Level of assistance: Modified independence and SBA Comments: severe scoliosis.    FUNCTIONAL TESTS:  5 times sit to stand: 30.86 Timed up and go (TUG): 29.39 2 minute walk test: 224ft  10 meter walk test: 0.626m/s, SOB 4-5.  Berg Balance Scale: TBD  PATIENT SURVEYS:  FOTO 56. goal 64  TODAY'S TREATMENT:                                                                                                                               DATE:  02/21/24  PT instructed pt in TUG: 13.76 sec with rollator  sec (average of 3 trials; >13.5 sec indicates increased fall risk)  Pt performed 5 time sit<>stand (5xSTS): 15.0 sec (>15 sec indicates increased fall risk)    6 Min Walk Test:  Instructed patient to ambulate as quickly and as safely as possible for 6 minutes using LRAD. Patient was allowed to take standing rest breaks without stopping the test, but if the patient required a sitting rest break the clock would be stopped and the test would be over.  Results:  1162ft with Rollator . Results indicate that the patient has reduced endurance with ambulation compared to age matched norms.  Age Matched Norms: 83-69 yo M: 26 F: 24, 69-79 yo M: 81 F: 471, 99-89 yo M: 417 F: 392 MDC: 58.21 meters (190.98 feet) or 50 meters (ANPTA Core Set of Outcome Measures for Adults with Neurologic Conditions, 2018)  10 Meter Walk Test: Patient instructed to walk 10 meters (32.8 ft) as quickly and as safely as possible  at their normal speed x2 and at a fast speed x2. Time measured from 2 meter mark to 8 meter mark to accommodate ramp-up and ramp-down.  Average Normal speed: 1.0 m/s Cut off scores: <0.4 m/s = household Ambulator, 0.4-0.8 m/s = limited community Ambulator, >0.8 m/s = community Ambulator, >1.2 m/s = crossing a street, <1.0 = increased fall risk MCID 0.05 m/s (small), 0.13 m/s (moderate), 0.06 m/s (significant)  (ANPTA Core Set of Outcome Measures for Adults with Neurologic Conditions, 2018)    PATIENT EDUCATION: Education details: Pt educated throughout session about proper posture and technique with exercises. Improved exercise technique, movement at target joints, use of target muscles after min to mod verbal, visual, tactile cues.  Person educated: Patient Education method: Explanation Education comprehension: verbalized understanding and returned demonstration  HOME EXERCISE PROGRAM: Access Code:  [email or web address] URL: [email or web address] Date: 09/25/2023 Prepared by: Grier Rocher  Exercises - Seated Hip Abduction with Resistance  - 1 x daily - 4 x weekly - 3 sets - 15 reps - 3 hold - Seated March with Resistance  - 1 x daily - 4 x weekly - 3 sets - 10 reps - 1 hold - Seated Long Arc Quad  - 1 x daily - 4 x weekly - 3 sets - 12 reps - 3 hold - Seated Hip Adduction Isometrics with Ball  - 1 x daily - 4 x weekly - 3 sets - 12 reps - 3 hold - Standing March with Counter Support  - 1 x daily - 3 x weekly - 2 sets - 10 reps - Standing Hip Abduction with Counter Support  - 1 x daily - 3 x weekly - 2 sets - 10 reps - Standing Hip Extension with Counter Support  - 1 x daily - 3 x weekly - 2 sets - 10 reps - Mini Squat with Counter Support  - 1 x daily - 3 x weekly - 2 sets - 10 reps - Standing Knee Flexion with Counter Support  - 1 x daily - 3 x weekly - 2 sets - 10 reps - Standing Tandem Balance with Counter Support  - 1 x daily - 3 x weekly - 2 sets - 4 reps - 10 hold  GOALS: Goals reviewed with patient? Yes   SHORT TERM GOALS: Target date: 12/28/23    Patient will be independent in home exercise program to improve strength/mobility for better functional independence with ADLs. Baseline:to be given at next session; 10/23/2023- Patient reports independence with HEP provided and no significant issues.   Goal status: MET 2. Pt will be able to ambulate for at least 5 min without stopping with LRAD to allow improved access to community and return to PLOF  Baseline: 3 minutes. 2/11: 6 minute walk test completed.   Goal Status: MET   . LONG TERM GOALS: Target date: 04/23/2024   Patient will increase FOTO score to equal to or greater than  61   to demonstrate statistically significant improvement in mobility and quality of life.  Baseline: 56; 10/23/2023= 60 Goal status: PROGRESSING/DISCONTINUED   2.  Patient (> 57 years old) will complete five times sit to stand test in < 15  seconds  without UE support indicating an increased LE strength and improved balance. Baseline: 30.86 sec; 10/23/2023= 22.08 using 1 UE support.  12/11: 14.67 sec BUE from arm rest. And 14.12 with 1 UE pushing from arm rest. 01/30/24 13 seconds with UE  3/26: 15.0  able to achieve full stand on  each repetition  Goal status: MET  3.  Patient will increase Berg Balance score to >45 points to demonstrate decreased fall risk during functional activities  Baseline: 26; 10/23/2023= 30 12/11: 34 01/30/24: 41/56 3/26: 40/56 Goal status: MET/ Adjusted   4.  Patient will increase 10 meter walk test to >1.13m/s as to improve gait speed for better community ambulation and to reduce fall risk. Baseline: 0.627m/s; 10/23/2023= 0.88 m/s (normal speed with 4WW) 12/11: Average Fast speed: 1.05 m/s Average Normal speed: 0.834 m/s 01/30/24: 1.14 m/s 3/26: 1.0 m/s Goal status: MET  5.  Patient will reduce timed up and go to <11 seconds to reduce fall risk and demonstrate improved transfer/gait ability. Baseline: 29.39 12/2: 16.4 sec with Rollator  12/11: 15.19 sec with rollator  2/4: 17.38 sec with rollator  3/4: 15 sec with rollator  3/26: 13.76 sec with rollator  Goal status: IN PROGRESS  6.  Patient will 2 min walk test by at least 69ft as to demonstrate reduced fall risk and improved dynamic gait balance for better safety with community/home ambulation.   Baseline: 232ft; 10/23/2023= 360 feet  with 4WW 356ft with 4WW Goal status: MET  7. Pt will complete 6 min walk test without stopping and improve overall score by at least 148ft to indicate increased access to community.   Baseline: to be complete 2/11:880 ft with RW; 01/30/24: 1168 ft with RW  3/26: 1116ft with Rollator  Goal: MET    ASSESSMENT: CLINICAL IMPRESSION:   PT treatment focused on goal assessment for progress note. Pt continues to demonstrate maintenance of progress or decreased fall risk with reduced time on Tug, improved technique  on 5x STS, and sustained progress on Berg and 6 min walk test. Pt reports feeling more secure on her feet and increased endurance, but does not feel comfortable with community ambulation without AD. Patient's condition has the potential to improve in response to therapy. Maximum improvement is yet to be obtained. The anticipated improvement is attainable and reasonable in a generally predictable time. Pt will continue to benefit from skilled physical therapy intervention to address impairments, improve QOL, and attain therapy goals.       OBJECTIVE IMPAIRMENTS: Abnormal gait, cardiopulmonary status limiting activity, decreased activity tolerance, decreased balance, decreased endurance, decreased mobility, difficulty walking, decreased ROM, decreased strength, decreased safety awareness, hypomobility, increased fascial restrictions, impaired flexibility, improper body mechanics, and postural dysfunction.   ACTIVITY LIMITATIONS: lifting, standing, squatting, transfers, and locomotion level  PARTICIPATION LIMITATIONS: laundry, shopping, and community activity  PERSONAL FACTORS: 3+ comorbidities: scoliosis, hx of CA, and PE  are also affecting patient's functional outcome.   REHAB POTENTIAL: Good  CLINICAL DECISION MAKING: Stable/uncomplicated  EVALUATION COMPLEXITY: Moderate  PLAN:  PT FREQUENCY: 1-2x/week  PT DURATION: 12 weeks  PLANNED INTERVENTIONS: Therapeutic exercises, Therapeutic activity, Neuromuscular re-education, Balance training, Gait training, Patient/Family education, Self Care, Joint mobilization, Stair training, Vestibular training, DME instructions, Dry Needling, Spinal mobilization, Cryotherapy, Moist heat, and Manual therapy  PLAN FOR NEXT SESSION:  Continue Dynamic balance and gait training. Progressing to higher level variable direction stepping and reaching tasks  BLE and postural strengthening. Continue plan   Grier Rocher PT, DPT  Physical Therapist - [facility]  9:09 AM 02/22/24

## 2024-02-22 ENCOUNTER — Ambulatory Visit: Payer: Medicare Other

## 2024-02-27 ENCOUNTER — Ambulatory Visit: Payer: Medicare Other | Attending: Physician Assistant

## 2024-02-27 DIAGNOSIS — R2689 Other abnormalities of gait and mobility: Secondary | ICD-10-CM

## 2024-02-27 DIAGNOSIS — R262 Difficulty in walking, not elsewhere classified: Secondary | ICD-10-CM

## 2024-02-27 DIAGNOSIS — M5459 Other low back pain: Secondary | ICD-10-CM | POA: Diagnosis present

## 2024-02-27 DIAGNOSIS — R2681 Unsteadiness on feet: Secondary | ICD-10-CM | POA: Diagnosis present

## 2024-02-27 DIAGNOSIS — M6281 Muscle weakness (generalized): Secondary | ICD-10-CM | POA: Diagnosis present

## 2024-02-27 DIAGNOSIS — R278 Other lack of coordination: Secondary | ICD-10-CM | POA: Diagnosis present

## 2024-02-27 NOTE — Therapy (Signed)
 OUTPATIENT PHYSICAL THERAPY NEURO TREATMENT NOTE      Patient Name: Kristina Rowe MRN: [id number] DOB:[birth year], 89 y.o., female Today's Date: 02/28/2024   PCP: Enid Baas, MD  REFERRING PROVIDER:   Ignacia Bayley, PA-C    END OF SESSION:   PT End of Session - 02/27/24 1421     Visit Number 31    Number of Visits 38    Date for PT Re-Evaluation 04/23/24    Progress Note Due on Visit 40    PT Start Time 1406    PT Stop Time 1444    PT Time Calculation (min) 38 min    Equipment Utilized During Treatment Gait belt    Activity Tolerance Patient tolerated treatment well;No increased pain    Behavior During Therapy WFL for tasks assessed/performed                Past Medical History:  Diagnosis Date   Ductal carcinoma in situ (DCIS) of left breast    Dysphagia    Esophageal stricture    Gastroparesis    GERD (gastroesophageal reflux disease)    Hiatal hernia    Hypertension    Iron deficiency anemia    Osteoporosis    Pulmonary embolism (HCC)    Scoliosis    UTI (urinary tract infection)    Past Surgical History:  Procedure Laterality Date   BREAST LUMPECTOMY Left 2009   Ductal Carcinoma insitu    CATARACT EXTRACTION, BILATERAL     COLONOSCOPY N/A 03/17/2021   Procedure: COLONOSCOPY;  Surgeon: Regis Bill, MD;  Location: [facility];  Service: Endoscopy;  Laterality: N/A;   COLOSTOMY REVISION Right 03/18/2021   Procedure: COLON RESECTION RIGHT;  Surgeon: Henrene Dodge, MD;  Location: [facility];  Service: General;  Laterality: Right;   LAPAROSCOPIC PARAESOPHAGEAL HERNIA REPAIR  2009   LAPAROTOMY N/A 03/10/2021   Procedure: EXPLORATORY LAPAROTOMY;  Surgeon: Henrene Dodge, MD;  Location: [facility];  Service: General;  Laterality: N/A;   TUBAL LIGATION     Patient Active Problem List   Diagnosis Date Noted   Acute on chronic diastolic CHF (congestive heart failure) (HCC) 08/19/2023   Acute respiratory failure with hypoxia (HCC) 08/17/2023    History of pulmonary embolus (PE) 08/17/2023   Basal cell carcinoma of leg 04/01/2021   Coronary atherosclerosis 04/01/2021   History of pulmonary embolism 04/01/2021   Pulmonary nodule 04/01/2021   Malignant neoplasm of colon (HCC) 03/29/2021   Rectal bleeding 03/15/2021   Hypertension    GERD (gastroesophageal reflux disease)    Colonic mass    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HCC) 03/10/2021   Anemia, unspecified 03/06/2021   Bronchiectasis without complication (HCC) 03/27/2020   Iron deficiency anemia 03/27/2020   Moderate aortic stenosis 03/27/2020   Stage 3a chronic kidney disease (HCC) 07/04/2019   Hypnic headache 05/28/2019   Telogen effluvium 04/17/2019   Xerosis cutis 04/17/2019   Essential hypertension 01/14/2019   Cervical radiculopathy 05/04/2018   Osteoarthritis of left glenohumeral joint 05/04/2018   Vitamin B12 deficiency 01/01/2018   Vascular abnormality 09/11/2017   Dyspepsia 03/28/2017   Pulmonary embolism (HCC) 08/16/2016   Dilated pore of [place] 03/23/2015   Retinal drusen of both eyes 08/28/2014   Status post right cataract extraction 07/30/2014   Verruca 03/19/2014   Chronic back pain 11/06/2013   Postmenopausal osteoporosis 06/11/2013   Anticoagulated on Coumadin 04/06/2013   Long term (current) use of anticoagulants 10/18/2012   Preglaucoma 05/10/2012   Presbyopia 05/10/2012   Senile nuclear sclerosis  05/10/2012   Lobular carcinoma in situ of breast 04/13/2012   Closed fracture of ankle 02/27/2012   History of nonmelanoma skin cancer 09/05/2011   Other benign neoplasm of skin of trunk 09/05/2011   Osteoporosis 08/25/2011   Transient alteration of awareness 09/03/2009   Colon adenoma 07/30/2002    ONSET DATE: 3 months   REFERRING DIAG:  Diagnosis  R53.83 (ICD-10-CM) - Other fatigue    THERAPY DIAG:  Difficulty in walking, not elsewhere classified  Muscle weakness (generalized)  Unsteadiness on feet  Other abnormalities of gait and  mobility  Other low back pain  Other lack of coordination  Rationale for Evaluation and Treatment: Rehabilitation  SUBJECTIVE:                                                                                                                                                                                             SUBJECTIVE STATEMENT:  Pt reports doing okay without any new complaints. Continues to deny falls.  Pt accompanied by: self  PERTINENT HISTORY:  From recent MD visit.  89 y.o. here for an acute issue. She has a PMH of chronic PE/Coumadin, anemia, colon cancer, breast cancer, large hiatal hernia, moderate aortic stenosis who has concerns about shortness of breath, nausea, generalized weakness. No chest pain. History of hiatal hernia and recent CT chest. Also recent endoscopy. Takes Zofran chronically. Has felt weaker and almost fell recently. Uses a walker. Also on chronic UTI suppression, Keflex.   PAIN:  Are you having pain? Yes: NPRS scale: 5/10 Pain location: low back  Pain description: sore  Aggravating factors: movement  Relieving factors: rest   PRECAUTIONS: Fall  RED FLAGS: None   WEIGHT BEARING RESTRICTIONS: No  FALLS: Has patient fallen in last 6 months? No  LIVING ENVIRONMENT: Lives with: lives alone Lives in: House/apartment Stairs: Yes: External: 1 steps; none Has following equipment at home: Walker - 4 wheeled  PLOF: Independent with household mobility with device and Independent with community mobility with device  PATIENT GOALS: improved strength in BLE   OBJECTIVE:   DIAGNOSTIC FINDINGS:  06/12/2023 EXAM: CT CHEST, ABDOMEN, AND PELVIS WITH CONTRAST IMPRESSION: 1. Status post right hemicolectomy, without recurrent or metastatic disease. Decreased sensitivity exam, primarily involving the abdomen pelvis, due to paucity of fat. 2. New tiny left groin hernia containing fluid. Consider physical exam correlation. 3. Incidental findings,  including: Coronary artery atherosclerosis. Aortic Atherosclerosis (ICD10-I70.0). Dilated esophagus with contrast throughout, suggesting dysmotility and/or gastroesophageal reflux.  COGNITION: Overall cognitive status: Within functional limits for tasks assessed   SENSATION: Light touch: Impaired  mild paraesthesia in the proximal RLE  COORDINATION: WFL. Ankle to knee limited ROM due to weakness in the R hip      POSTURE: rounded shoulders, forward head, flexed trunk , and severe scoliosis     LOWER EXTREMITY MMT:    MMT Right Eval Left Eval  Hip flexion 4- 4  Hip extension    Hip abduction 4- 44  Hip adduction 4 4  Hip internal rotation    Hip external rotation    Knee flexion 4- 4  Knee extension 4 4+  Ankle dorsiflexion 4 4+  Ankle plantarflexion    Ankle inversion    Ankle eversion    (Blank rows = not tested)    TRANSFERS: Assistive device utilized: Environmental consultant - 4 wheeled  Sit to stand: SBA Stand to sit: SBA Chair to chair: SBA Floor:  Unable to perform    STAIRS: Level of Assistance:  pt declines Stair Negotiation Technique: Step to Pattern with Bilateral Rails Number of Stairs: 4  Height of Stairs: 6  Comments: will need to assess.   GAIT: Gait pattern: step through pattern, Right foot flat, and lateral hip instability Distance walked: 265ft Assistive device utilized: Walker - 4 wheeled Level of assistance: Modified independence and SBA Comments: severe scoliosis.    FUNCTIONAL TESTS:  5 times sit to stand: 30.86 Timed up and go (TUG): 29.39 2 minute walk test: 222ft  10 meter walk test: 0.68m/s, SOB 4-5.  Berg Balance Scale: TBD  PATIENT SURVEYS:  FOTO 56. goal 64  TODAY'S TREATMENT:                                                                                                                              DATE:  02/27/2024  NMR:   Static stand on airex pad with EO x 30 sec x 2, EC x 10 sec x 2 (unsteady but no LOB), EO with head  turns x 10 reps each direction Dynamic march at support bar (hands hovering but not touching) x 20 reps (minimal step height with unsteadiness)  Dynamic step tap without UE support x 20 reps each LE Dynamic lateral step over 1/2 foam without UE support x 15 reps each LE  Postural training:  - static stand at wall - focusing on thoracic ext x 1 min -Static stand at wall - with horizontal shoulder ABD x 12 reps  -Static stand at wall- scapular retraction x 12 reps      PATIENT EDUCATION: Education details: Pt educated throughout session about proper posture and technique with exercises. Improved exercise technique, movement at target joints, use of target muscles after min to mod verbal, visual, tactile cues.  Person educated: Patient Education method: Explanation Education comprehension: verbalized understanding and returned demonstration  HOME EXERCISE PROGRAM: Access Code: [email or web address] URL: [email or web address] Date: 09/25/2023 Prepared by: Grier Rocher  Exercises - Seated Hip Abduction with Resistance  - 1 x daily - 4 x weekly - 3 sets - 15 reps - 3 hold -  Seated March with Resistance  - 1 x daily - 4 x weekly - 3 sets - 10 reps - 1 hold - Seated Long Arc Quad  - 1 x daily - 4 x weekly - 3 sets - 12 reps - 3 hold - Seated Hip Adduction Isometrics with Ball  - 1 x daily - 4 x weekly - 3 sets - 12 reps - 3 hold - Standing March with Counter Support  - 1 x daily - 3 x weekly - 2 sets - 10 reps - Standing Hip Abduction with Counter Support  - 1 x daily - 3 x weekly - 2 sets - 10 reps - Standing Hip Extension with Counter Support  - 1 x daily - 3 x weekly - 2 sets - 10 reps - Mini Squat with Counter Support  - 1 x daily - 3 x weekly - 2 sets - 10 reps - Standing Knee Flexion with Counter Support  - 1 x daily - 3 x weekly - 2 sets - 10 reps - Standing Tandem Balance with Counter Support  - 1 x daily - 3 x weekly - 2 sets - 4 reps - 10 hold  GOALS: Goals reviewed with  patient? Yes   SHORT TERM GOALS: Target date: 12/28/23    Patient will be independent in home exercise program to improve strength/mobility for better functional independence with ADLs. Baseline:to be given at next session; 10/23/2023- Patient reports independence with HEP provided and no significant issues.   Goal status: MET 2. Pt will be able to ambulate for at least 5 min without stopping with LRAD to allow improved access to community and return to PLOF  Baseline: 3 minutes. 2/11: 6 minute walk test completed.   Goal Status: MET   . LONG TERM GOALS: Target date: 04/23/2024   Patient will increase FOTO score to equal to or greater than  61   to demonstrate statistically significant improvement in mobility and quality of life.  Baseline: 56; 10/23/2023= 60 Goal status: PROGRESSING/DISCONTINUED   2.  Patient (> 31 years old) will complete five times sit to stand test in < 15 seconds  without UE support indicating an increased LE strength and improved balance. Baseline: 30.86 sec; 10/23/2023= 22.08 using 1 UE support.  12/11: 14.67 sec BUE from arm rest. And 14.12 with 1 UE pushing from arm rest. 01/30/24 13 seconds with UE  3/26: 15.0  able to achieve full stand on each repetition  Goal status: MET  3.  Patient will increase Berg Balance score to >45 points to demonstrate decreased fall risk during functional activities  Baseline: 26; 10/23/2023= 30 12/11: 34 01/30/24: 41/56 3/26: 40/56 Goal status: MET/ Adjusted   4.  Patient will increase 10 meter walk test to >1.11m/s as to improve gait speed for better community ambulation and to reduce fall risk. Baseline: 0.657m/s; 10/23/2023= 0.88 m/s (normal speed with 4WW) 12/11: Average Fast speed: 1.05 m/s Average Normal speed: 0.834 m/s 01/30/24: 1.14 m/s 3/26: 1.0 m/s Goal status: MET  5.  Patient will reduce timed up and go to <11 seconds to reduce fall risk and demonstrate improved transfer/gait ability. Baseline: 29.39 12/2: 16.4  sec with Rollator  12/11: 15.19 sec with rollator  2/4: 17.38 sec with rollator  3/4: 15 sec with rollator  3/26: 13.76 sec with rollator  Goal status: IN PROGRESS  6.  Patient will 2 min walk test by at least 68ft as to demonstrate reduced fall risk and improved dynamic gait  balance for better safety with community/home ambulation.   Baseline: 265ft; 10/23/2023= 360 feet  with 4WW 394ft with 4WW Goal status: MET  7. Pt will complete 6 min walk test without stopping and improve overall score by at least 13ft to indicate increased access to community.   Baseline: to be complete 2/11:880 ft with RW; 01/30/24: 1168 ft with RW  3/26: 1153ft with Rollator  Goal: MET    ASSESSMENT: CLINICAL IMPRESSION:   Patient performed well with all dynamic activities- challenged with standing posture activities- stating "I know I need to be working on my posture." She performed well- able to follow all cues and gained some confidence in her SLS ability without looking down. Most challenged with eyes closed activities overall today- unsteady but able to use her vestibular system well enough and no physical assist. The anticipated improvement is attainable and reasonable in a generally predictable time. Pt will continue to benefit from skilled physical therapy intervention to address impairments, improve QOL, and attain therapy goals.       OBJECTIVE IMPAIRMENTS: Abnormal gait, cardiopulmonary status limiting activity, decreased activity tolerance, decreased balance, decreased endurance, decreased mobility, difficulty walking, decreased ROM, decreased strength, decreased safety awareness, hypomobility, increased fascial restrictions, impaired flexibility, improper body mechanics, and postural dysfunction.   ACTIVITY LIMITATIONS: lifting, standing, squatting, transfers, and locomotion level  PARTICIPATION LIMITATIONS: laundry, shopping, and community activity  PERSONAL FACTORS: 3+ comorbidities: scoliosis,  hx of CA, and PE  are also affecting patient's functional outcome.   REHAB POTENTIAL: Good  CLINICAL DECISION MAKING: Stable/uncomplicated  EVALUATION COMPLEXITY: Moderate  PLAN:  PT FREQUENCY: 1-2x/week  PT DURATION: 12 weeks  PLANNED INTERVENTIONS: Therapeutic exercises, Therapeutic activity, Neuromuscular re-education, Balance training, Gait training, Patient/Family education, Self Care, Joint mobilization, Stair training, Vestibular training, DME instructions, Dry Needling, Spinal mobilization, Cryotherapy, Moist heat, and Manual therapy  PLAN FOR NEXT SESSION:  Continue Dynamic balance and gait training. Progressing to higher level variable direction stepping and reaching tasks  BLE and postural strengthening. Continue plan   Louis Meckel, PT Physical Therapist - [facility]  [facility]  9:13 AM 02/28/24

## 2024-02-29 ENCOUNTER — Ambulatory Visit: Payer: Medicare Other | Admitting: Physical Therapy

## 2024-02-29 DIAGNOSIS — R262 Difficulty in walking, not elsewhere classified: Secondary | ICD-10-CM | POA: Diagnosis not present

## 2024-02-29 DIAGNOSIS — R278 Other lack of coordination: Secondary | ICD-10-CM

## 2024-02-29 DIAGNOSIS — M5459 Other low back pain: Secondary | ICD-10-CM

## 2024-02-29 DIAGNOSIS — M6281 Muscle weakness (generalized): Secondary | ICD-10-CM

## 2024-02-29 DIAGNOSIS — R2681 Unsteadiness on feet: Secondary | ICD-10-CM

## 2024-02-29 DIAGNOSIS — R2689 Other abnormalities of gait and mobility: Secondary | ICD-10-CM

## 2024-02-29 NOTE — Therapy (Signed)
 OUTPATIENT PHYSICAL THERAPY NEURO TREATMENT NOTE      Patient Name: Kristina Rowe MRN: [id number] DOB:November 22, 1930, 89 y.o., female Today's Date: 02/29/2024   PCP: Enid Baas, MD  REFERRING PROVIDER:   Ignacia Bayley, PA-C    END OF SESSION:   PT End of Session - 02/29/24 1527     Visit Number 32    Number of Visits 38    Date for PT Re-Evaluation 04/23/24    Progress Note Due on Visit 40    PT Start Time 1530    PT Stop Time 1615    PT Time Calculation (min) 45 min    Equipment Utilized During Treatment Gait belt    Activity Tolerance Patient tolerated treatment well;No increased pain    Behavior During Therapy WFL for tasks assessed/performed                 Past Medical History:  Diagnosis Date   Ductal carcinoma in situ (DCIS) of left breast    Dysphagia    Esophageal stricture    Gastroparesis    GERD (gastroesophageal reflux disease)    Hiatal hernia    Hypertension    Iron deficiency anemia    Osteoporosis    Pulmonary embolism (HCC)    Scoliosis    UTI (urinary tract infection)    Past Surgical History:  Procedure Laterality Date   BREAST LUMPECTOMY Left 2009   Ductal Carcinoma insitu    CATARACT EXTRACTION, BILATERAL     COLONOSCOPY N/A 03/17/2021   Procedure: COLONOSCOPY;  Surgeon: Regis Bill, MD;  Location: [facility];  Service: Endoscopy;  Laterality: N/A;   COLOSTOMY REVISION Right 03/18/2021   Procedure: COLON RESECTION RIGHT;  Surgeon: Henrene Dodge, MD;  Location: [facility];  Service: General;  Laterality: Right;   LAPAROSCOPIC PARAESOPHAGEAL HERNIA REPAIR  2009   LAPAROTOMY N/A 03/10/2021   Procedure: EXPLORATORY LAPAROTOMY;  Surgeon: Henrene Dodge, MD;  Location: [facility];  Service: General;  Laterality: N/A;   TUBAL LIGATION     Patient Active Problem List   Diagnosis Date Noted   Acute on chronic diastolic CHF (congestive heart failure) (HCC) 08/19/2023   Acute respiratory failure with hypoxia (HCC) 08/17/2023    History of pulmonary embolus (PE) 08/17/2023   Basal cell carcinoma of leg 04/01/2021   Coronary atherosclerosis 04/01/2021   History of pulmonary embolism 04/01/2021   Pulmonary nodule 04/01/2021   Malignant neoplasm of colon (HCC) 03/29/2021   Rectal bleeding 03/15/2021   Hypertension    GERD (gastroesophageal reflux disease)    Colonic mass    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HCC) 03/10/2021   Anemia, unspecified 03/06/2021   Bronchiectasis without complication (HCC) 03/27/2020   Iron deficiency anemia 03/27/2020   Moderate aortic stenosis 03/27/2020   Stage 3a chronic kidney disease (HCC) 07/04/2019   Hypnic headache 05/28/2019   Telogen effluvium 04/17/2019   Xerosis cutis 04/17/2019   Essential hypertension 01/14/2019   Cervical radiculopathy 05/04/2018   Osteoarthritis of left glenohumeral joint 05/04/2018   Vitamin B12 deficiency 01/01/2018   Vascular abnormality 09/11/2017   Dyspepsia 03/28/2017   Pulmonary embolism (HCC) 08/16/2016   Dilated pore of [place] 03/23/2015   Retinal drusen of both eyes 08/28/2014   Status post right cataract extraction 07/30/2014   Verruca 03/19/2014   Chronic back pain 11/06/2013   Postmenopausal osteoporosis 06/11/2013   Anticoagulated on Coumadin 04/06/2013   Long term (current) use of anticoagulants 10/18/2012   Preglaucoma 05/10/2012   Presbyopia 05/10/2012   Senile nuclear  sclerosis 05/10/2012   Lobular carcinoma in situ of breast 04/13/2012   Closed fracture of ankle 02/27/2012   History of nonmelanoma skin cancer 09/05/2011   Other benign neoplasm of skin of trunk 09/05/2011   Osteoporosis 08/25/2011   Transient alteration of awareness 09/03/2009   Colon adenoma 07/30/2002    ONSET DATE: 3 months   REFERRING DIAG:  Diagnosis  R53.83 (ICD-10-CM) - Other fatigue    THERAPY DIAG:  Difficulty in walking, not elsewhere classified  Muscle weakness (generalized)  Other abnormalities of gait and mobility  Unsteadiness on  feet  Other low back pain  Other lack of coordination  Rationale for Evaluation and Treatment: Rehabilitation  SUBJECTIVE:                                                                                                                                                                                             SUBJECTIVE STATEMENT:   Pt reports she has been doing pretty well. States she has been trying to walk without her cane some at home using furniture walking as necessary. Reports she would like to be able to use her cane on one side and holding someone's arm on the opposite side when going to restraunts. Continues to deny any falls.   Pt accompanied by: self  PERTINENT HISTORY:  From recent MD visit.  89 y.o. here for an acute issue. She has a PMH of chronic PE/Coumadin, anemia, colon cancer, breast cancer, large hiatal hernia, moderate aortic stenosis who has concerns about shortness of breath, nausea, generalized weakness. No chest pain. History of hiatal hernia and recent CT chest. Also recent endoscopy. Takes Zofran chronically. Has felt weaker and almost fell recently. Uses a walker. Also on chronic UTI suppression, Keflex.   PAIN:  Are you having pain? Yes: NPRS scale: 5/10 Pain location: low back  Pain description: sore  Aggravating factors: movement  Relieving factors: rest   PRECAUTIONS: Fall  RED FLAGS: None   WEIGHT BEARING RESTRICTIONS: No  FALLS: Has patient fallen in last 6 months? No  LIVING ENVIRONMENT: Lives with: lives alone Lives in: House/apartment Stairs: Yes: External: 1 steps; none Has following equipment at home: Walker - 4 wheeled  PLOF: Independent with household mobility with device and Independent with community mobility with device  PATIENT GOALS: improved strength in BLE   OBJECTIVE:   DIAGNOSTIC FINDINGS:  06/12/2023 EXAM: CT CHEST, ABDOMEN, AND PELVIS WITH CONTRAST IMPRESSION: 1. Status post right hemicolectomy, without  recurrent or metastatic disease. Decreased sensitivity exam, primarily involving the abdomen pelvis, due to paucity of fat. 2. New tiny left groin hernia containing fluid. Consider  physical exam correlation. 3. Incidental findings, including: Coronary artery atherosclerosis. Aortic Atherosclerosis (ICD10-I70.0). Dilated esophagus with contrast throughout, suggesting dysmotility and/or gastroesophageal reflux.  COGNITION: Overall cognitive status: Within functional limits for tasks assessed   SENSATION: Light touch: Impaired  mild paraesthesia in the proximal RLE   COORDINATION: WFL. Ankle to knee limited ROM due to weakness in the R hip      POSTURE: rounded shoulders, forward head, flexed trunk , and severe scoliosis     LOWER EXTREMITY MMT:    MMT Right Eval Left Eval  Hip flexion 4- 4  Hip extension    Hip abduction 4- 44  Hip adduction 4 4  Hip internal rotation    Hip external rotation    Knee flexion 4- 4  Knee extension 4 4+  Ankle dorsiflexion 4 4+  Ankle plantarflexion    Ankle inversion    Ankle eversion    (Blank rows = not tested)    TRANSFERS: Assistive device utilized: Environmental consultant - 4 wheeled  Sit to stand: SBA Stand to sit: SBA Chair to chair: SBA Floor:  Unable to perform    STAIRS: Level of Assistance:  pt declines Stair Negotiation Technique: Step to Pattern with Bilateral Rails Number of Stairs: 4  Height of Stairs: 6  Comments: will need to assess.   GAIT: Gait pattern: step through pattern, Right foot flat, and lateral hip instability Distance walked: 275ft Assistive device utilized: Walker - 4 wheeled Level of assistance: Modified independence and SBA Comments: severe scoliosis.    FUNCTIONAL TESTS:  5 times sit to stand: 30.86 Timed up and go (TUG): 29.39 2 minute walk test: 267ft  10 meter walk test: 0.688m/s, SOB 4-5.  Berg Balance Scale: TBD  PATIENT SURVEYS:  FOTO 56. goal 78  TODAY'S TREATMENT:                                                                                                                               DATE:  02/29/2024  Pt in agreement with education that she is not at a point that is safe to ambulate with a cane in the community at this time.  Gait training ~353ft x2 (seated break) using hurricane with CGA for safety progressed to performing head rotations to identify numbers held up by therapist - pt very uncomfortable with head rotations causing minor LOB initially but improved on 2nd set.   Dynamic stepping balance challenges as follows:  4 Blaze Pods set-up (2 in front of pt on green step with 2x purple plates and 1 on either side of pt to promote lateral taps)  - no UE support - CGA/light min A for balance with pt having greatest difficulty lifting L LE to tap on the step  4 Blaze Pods set-up around 1/2 foam roll (2 in front and 2 behind) to promote forward/backwards stepping over 1/2 foam roll - 30sec - requires CGA/light min A for balance with pt reporting  this as very challenging and having greatest difficulty with posterior stepping Side stepping over 1/2 foam roll with 4 Blaze Pods in 4 corners  Could progress this to changing Blaze Pod placement with some on mirror for looking up/down and/or addition of turning  Pt reports today's session as challenging, but appreciative of it.     PATIENT EDUCATION: Education details: Pt educated throughout session about proper posture and technique with exercises. Improved exercise technique, movement at target joints, use of target muscles after min to mod verbal, visual, tactile cues.  Person educated: Patient Education method: Explanation Education comprehension: verbalized understanding and returned demonstration  HOME EXERCISE PROGRAM: Access Code: [email or web address] URL: [email or web address] Date: 09/25/2023 Prepared by: Grier Rocher  Exercises - Seated Hip Abduction with Resistance  - 1 x daily - 4 x weekly - 3 sets -  15 reps - 3 hold - Seated March with Resistance  - 1 x daily - 4 x weekly - 3 sets - 10 reps - 1 hold - Seated Long Arc Quad  - 1 x daily - 4 x weekly - 3 sets - 12 reps - 3 hold - Seated Hip Adduction Isometrics with Ball  - 1 x daily - 4 x weekly - 3 sets - 12 reps - 3 hold - Standing March with Counter Support  - 1 x daily - 3 x weekly - 2 sets - 10 reps - Standing Hip Abduction with Counter Support  - 1 x daily - 3 x weekly - 2 sets - 10 reps - Standing Hip Extension with Counter Support  - 1 x daily - 3 x weekly - 2 sets - 10 reps - Mini Squat with Counter Support  - 1 x daily - 3 x weekly - 2 sets - 10 reps - Standing Knee Flexion with Counter Support  - 1 x daily - 3 x weekly - 2 sets - 10 reps - Standing Tandem Balance with Counter Support  - 1 x daily - 3 x weekly - 2 sets - 4 reps - 10 hold  GOALS: Goals reviewed with patient? Yes   SHORT TERM GOALS: Target date: 12/28/23    Patient will be independent in home exercise program to improve strength/mobility for better functional independence with ADLs. Baseline:to be given at next session; 10/23/2023- Patient reports independence with HEP provided and no significant issues.   Goal status: MET 2. Pt will be able to ambulate for at least 5 min without stopping with LRAD to allow improved access to community and return to PLOF  Baseline: 3 minutes. 2/11: 6 minute walk test completed.   Goal Status: MET   . LONG TERM GOALS: Target date: 04/23/2024   Patient will increase FOTO score to equal to or greater than  61   to demonstrate statistically significant improvement in mobility and quality of life.  Baseline: 56; 10/23/2023= 60 Goal status: PROGRESSING/DISCONTINUED   2.  Patient (> 47 years old) will complete five times sit to stand test in < 15 seconds  without UE support indicating an increased LE strength and improved balance. Baseline: 30.86 sec; 10/23/2023= 22.08 using 1 UE support.  12/11: 14.67 sec BUE from arm rest. And  14.12 with 1 UE pushing from arm rest. 01/30/24 13 seconds with UE  3/26: 15.0  able to achieve full stand on each repetition  Goal status: MET  3.  Patient will increase Berg Balance score to >45 points to demonstrate decreased fall risk during functional activities  Baseline: 26;  10/23/2023= 30 12/11: 34 01/30/24: 41/56 3/26: 40/56 Goal status: MET/ Adjusted   4.  Patient will increase 10 meter walk test to >1.82m/s as to improve gait speed for better community ambulation and to reduce fall risk. Baseline: 0.667m/s; 10/23/2023= 0.88 m/s (normal speed with 4WW) 12/11: Average Fast speed: 1.05 m/s Average Normal speed: 0.834 m/s 01/30/24: 1.14 m/s 3/26: 1.0 m/s Goal status: MET  5.  Patient will reduce timed up and go to <11 seconds to reduce fall risk and demonstrate improved transfer/gait ability. Baseline: 29.39 12/2: 16.4 sec with Rollator  12/11: 15.19 sec with rollator  2/4: 17.38 sec with rollator  3/4: 15 sec with rollator  3/26: 13.76 sec with rollator  Goal status: IN PROGRESS  6.  Patient will 2 min walk test by at least 64ft as to demonstrate reduced fall risk and improved dynamic gait balance for better safety with community/home ambulation.   Baseline: 255ft; 10/23/2023= 360 feet  with 4WW 35ft with 4WW Goal status: MET  7. Pt will complete 6 min walk test without stopping and improve overall score by at least 162ft to indicate increased access to community.   Baseline: to be complete 2/11:880 ft with RW; 01/30/24: 1168 ft with RW  3/26: 1172ft with Rollator  Goal: MET    ASSESSMENT: CLINICAL IMPRESSION:  Patient eager to participate in therapy session and reports she appreciates the challenge. Therapy session focused on pt's LTG of ambulating with a hurricane with addition of head rotations as well as dynamic stepping balance challenges. Pt challenged by having to perform head rotations while ambulating as well as dual-task challenges during posterior stepping. She  will also benefit from continuation of balance interventions with eyes closed targeting use of her vestibular system. Ms. Billiot will continue to benefit from skilled physical therapy intervention to address stated impairments, improve QOL, decrease fall risk, and attain therapy goals.       OBJECTIVE IMPAIRMENTS: Abnormal gait, cardiopulmonary status limiting activity, decreased activity tolerance, decreased balance, decreased endurance, decreased mobility, difficulty walking, decreased ROM, decreased strength, decreased safety awareness, hypomobility, increased fascial restrictions, impaired flexibility, improper body mechanics, and postural dysfunction.   ACTIVITY LIMITATIONS: lifting, standing, squatting, transfers, and locomotion level  PARTICIPATION LIMITATIONS: laundry, shopping, and community activity  PERSONAL FACTORS: 3+ comorbidities: scoliosis, hx of CA, and PE  are also affecting patient's functional outcome.   REHAB POTENTIAL: Good  CLINICAL DECISION MAKING: Stable/uncomplicated  EVALUATION COMPLEXITY: Moderate  PLAN:  PT FREQUENCY: 1-2x/week  PT DURATION: 12 weeks  PLANNED INTERVENTIONS: Therapeutic exercises, Therapeutic activity, Neuromuscular re-education, Balance training, Gait training, Patient/Family education, Self Care, Joint mobilization, Stair training, Vestibular training, DME instructions, Dry Needling, Spinal mobilization, Cryotherapy, Moist heat, and Manual therapy  PLAN FOR NEXT SESSION: Continue Dynamic balance and gait training. Progressing to higher level variable direction stepping and reaching tasks  BLE and postural strengthening. Goal to ambulate with hurricane    Casimiro Needle, PT, DPT, NCS, CSRS Physical Therapist - [facility]  [facility]  5:34 PM 02/29/24

## 2024-03-05 ENCOUNTER — Ambulatory Visit: Payer: Medicare Other

## 2024-03-06 ENCOUNTER — Inpatient Hospital Stay: Payer: Medicare Other | Attending: Oncology

## 2024-03-06 DIAGNOSIS — D508 Other iron deficiency anemias: Secondary | ICD-10-CM

## 2024-03-06 DIAGNOSIS — E538 Deficiency of other specified B group vitamins: Secondary | ICD-10-CM | POA: Insufficient documentation

## 2024-03-06 MED ORDER — CYANOCOBALAMIN 1000 MCG/ML IJ SOLN
1000.0000 ug | INTRAMUSCULAR | Status: DC
  Administered 2024-03-06: 1000 ug via INTRAMUSCULAR
  Filled 2024-03-06: qty 1

## 2024-03-07 ENCOUNTER — Ambulatory Visit: Payer: Medicare Other

## 2024-03-07 DIAGNOSIS — R278 Other lack of coordination: Secondary | ICD-10-CM

## 2024-03-07 DIAGNOSIS — R2689 Other abnormalities of gait and mobility: Secondary | ICD-10-CM

## 2024-03-07 DIAGNOSIS — M5459 Other low back pain: Secondary | ICD-10-CM

## 2024-03-07 DIAGNOSIS — R262 Difficulty in walking, not elsewhere classified: Secondary | ICD-10-CM | POA: Diagnosis not present

## 2024-03-07 DIAGNOSIS — R2681 Unsteadiness on feet: Secondary | ICD-10-CM

## 2024-03-07 DIAGNOSIS — M6281 Muscle weakness (generalized): Secondary | ICD-10-CM

## 2024-03-07 NOTE — Therapy (Signed)
 OUTPATIENT PHYSICAL THERAPY NEURO TREATMENT NOTE      Patient Name: Kristina Rowe MRN: [id number] DOB:1930/07/07, 89 y.o., female Today's Date: 03/08/2024   PCP: Enid Baas, MD  REFERRING PROVIDER:   Ignacia Bayley, PA-C    END OF SESSION:   PT End of Session - 03/07/24 1535     Visit Number 33    Number of Visits 38    Date for PT Re-Evaluation 04/23/24    Progress Note Due on Visit 40    PT Start Time 1532    PT Stop Time 1614    PT Time Calculation (min) 42 min    Equipment Utilized During Treatment Gait belt    Activity Tolerance Patient tolerated treatment well;No increased pain    Behavior During Therapy WFL for tasks assessed/performed                 Past Medical History:  Diagnosis Date   Ductal carcinoma in situ (DCIS) of left breast    Dysphagia    Esophageal stricture    Gastroparesis    GERD (gastroesophageal reflux disease)    Hiatal hernia    Hypertension    Iron deficiency anemia    Osteoporosis    Pulmonary embolism (HCC)    Scoliosis    UTI (urinary tract infection)    Past Surgical History:  Procedure Laterality Date   BREAST LUMPECTOMY Left 2009   Ductal Carcinoma insitu    CATARACT EXTRACTION, BILATERAL     COLONOSCOPY N/A 03/17/2021   Procedure: COLONOSCOPY;  Surgeon: Regis Bill, MD;  Location: [facility];  Service: Endoscopy;  Laterality: N/A;   COLOSTOMY REVISION Right 03/18/2021   Procedure: COLON RESECTION RIGHT;  Surgeon: Henrene Dodge, MD;  Location: [facility];  Service: General;  Laterality: Right;   LAPAROSCOPIC PARAESOPHAGEAL HERNIA REPAIR  2009   LAPAROTOMY N/A 03/10/2021   Procedure: EXPLORATORY LAPAROTOMY;  Surgeon: Henrene Dodge, MD;  Location: [facility];  Service: General;  Laterality: N/A;   TUBAL LIGATION     Patient Active Problem List   Diagnosis Date Noted   Acute on chronic diastolic CHF (congestive heart failure) (HCC) 08/19/2023   Acute respiratory failure with hypoxia (HCC)  08/17/2023   History of pulmonary embolus (PE) 08/17/2023   Basal cell carcinoma of leg 04/01/2021   Coronary atherosclerosis 04/01/2021   History of pulmonary embolism 04/01/2021   Pulmonary nodule 04/01/2021   Malignant neoplasm of colon (HCC) 03/29/2021   Rectal bleeding 03/15/2021   Hypertension    GERD (gastroesophageal reflux disease)    Colonic mass    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HCC) 03/10/2021   Anemia, unspecified 03/06/2021   Bronchiectasis without complication (HCC) 03/27/2020   Iron deficiency anemia 03/27/2020   Moderate aortic stenosis 03/27/2020   Stage 3a chronic kidney disease (HCC) 07/04/2019   Hypnic headache 05/28/2019   Telogen effluvium 04/17/2019   Xerosis cutis 04/17/2019   Essential hypertension 01/14/2019   Cervical radiculopathy 05/04/2018   Osteoarthritis of left glenohumeral joint 05/04/2018   Vitamin B12 deficiency 01/01/2018   Vascular abnormality 09/11/2017   Dyspepsia 03/28/2017   Pulmonary embolism (HCC) 08/16/2016   Dilated pore of [place] 03/23/2015   Retinal drusen of both eyes 08/28/2014   Status post right cataract extraction 07/30/2014   Verruca 03/19/2014   Chronic back pain 11/06/2013   Postmenopausal osteoporosis 06/11/2013   Anticoagulated on Coumadin 04/06/2013   Long term (current) use of anticoagulants 10/18/2012   Preglaucoma 05/10/2012   Presbyopia 05/10/2012   Senile nuclear  sclerosis 05/10/2012   Lobular carcinoma in situ of breast 04/13/2012   Closed fracture of ankle 02/27/2012   History of nonmelanoma skin cancer 09/05/2011   Other benign neoplasm of skin of trunk 09/05/2011   Osteoporosis 08/25/2011   Transient alteration of awareness 09/03/2009   Colon adenoma 07/30/2002    ONSET DATE: 3 months   REFERRING DIAG:  Diagnosis  R53.83 (ICD-10-CM) - Other fatigue    THERAPY DIAG:  Difficulty in walking, not elsewhere classified  Muscle weakness (generalized)  Other abnormalities of gait and  mobility  Unsteadiness on feet  Other low back pain  Other lack of coordination  Rationale for Evaluation and Treatment: Rehabilitation  SUBJECTIVE:                                                                                                                                                                                             SUBJECTIVE STATEMENT:   Patient reports doing well. Tired after last visit. Continued to state onoing goal of walking with cane into restaurants with family.    Pt accompanied by: self  PERTINENT HISTORY:  From recent MD visit.  89 y.o. here for an acute issue. She has a PMH of chronic PE/Coumadin, anemia, colon cancer, breast cancer, large hiatal hernia, moderate aortic stenosis who has concerns about shortness of breath, nausea, generalized weakness. No chest pain. History of hiatal hernia and recent CT chest. Also recent endoscopy. Takes Zofran chronically. Has felt weaker and almost fell recently. Uses a walker. Also on chronic UTI suppression, Keflex.   PAIN:  Are you having pain? Yes: NPRS scale: 5/10 Pain location: low back  Pain description: sore  Aggravating factors: movement  Relieving factors: rest   PRECAUTIONS: Fall  RED FLAGS: None   WEIGHT BEARING RESTRICTIONS: No  FALLS: Has patient fallen in last 6 months? No  LIVING ENVIRONMENT: Lives with: lives alone Lives in: House/apartment Stairs: Yes: External: 1 steps; none Has following equipment at home: Walker - 4 wheeled  PLOF: Independent with household mobility with device and Independent with community mobility with device  PATIENT GOALS: improved strength in BLE   OBJECTIVE:   DIAGNOSTIC FINDINGS:  06/12/2023 EXAM: CT CHEST, ABDOMEN, AND PELVIS WITH CONTRAST IMPRESSION: 1. Status post right hemicolectomy, without recurrent or metastatic disease. Decreased sensitivity exam, primarily involving the abdomen pelvis, due to paucity of fat. 2. New tiny left groin  hernia containing fluid. Consider physical exam correlation. 3. Incidental findings, including: Coronary artery atherosclerosis. Aortic Atherosclerosis (ICD10-I70.0). Dilated esophagus with contrast throughout, suggesting dysmotility and/or gastroesophageal reflux.  COGNITION: Overall cognitive status: Within functional limits for tasks assessed  SENSATION: Light touch: Impaired  mild paraesthesia in the proximal RLE   COORDINATION: WFL. Ankle to knee limited ROM due to weakness in the R hip      POSTURE: rounded shoulders, forward head, flexed trunk , and severe scoliosis     LOWER EXTREMITY MMT:    MMT Right Eval Left Eval  Hip flexion 4- 4  Hip extension    Hip abduction 4- 44  Hip adduction 4 4  Hip internal rotation    Hip external rotation    Knee flexion 4- 4  Knee extension 4 4+  Ankle dorsiflexion 4 4+  Ankle plantarflexion    Ankle inversion    Ankle eversion    (Blank rows = not tested)    TRANSFERS: Assistive device utilized: Environmental consultant - 4 wheeled  Sit to stand: SBA Stand to sit: SBA Chair to chair: SBA Floor:  Unable to perform    STAIRS: Level of Assistance:  pt declines Stair Negotiation Technique: Step to Pattern with Bilateral Rails Number of Stairs: 4  Height of Stairs: 6  Comments: will need to assess.   GAIT: Gait pattern: step through pattern, Right foot flat, and lateral hip instability Distance walked: 237ft Assistive device utilized: Walker - 4 wheeled Level of assistance: Modified independence and SBA Comments: severe scoliosis.    FUNCTIONAL TESTS:  5 times sit to stand: 30.86 Timed up and go (TUG): 29.39 2 minute walk test: 211ft  10 meter walk test: 0.632m/s, SOB 4-5.  Berg Balance Scale: TBD  PATIENT SURVEYS:  FOTO 56. goal 64  TODAY'S TREATMENT:                                                                                                                              DATE:  03/08/2024    Gait training ~360   (seated break) using hurricane with CGA for safety - No significant LOB or foot drag. Patient becoming more familiar with practice.  Dynamic stepping balance challenges as follows:  Step tap at steps (no UE support) x 20 reps alt LE Step up 2 sets of 10 reps each LE (1UE support)  Gait using cane - forward/retro counting steps along 10 foot measured distance. - improved with decreased step both forward and backward - 5 total trials.  Standing hip ext 2 x 10 reps (VC for erect posture)    PATIENT EDUCATION: Education details: Pt educated throughout session about proper posture and technique with exercises. Improved exercise technique, movement at target joints, use of target muscles after min to mod verbal, visual, tactile cues.  Person educated: Patient Education method: Explanation Education comprehension: verbalized understanding and returned demonstration  HOME EXERCISE PROGRAM: Access Code: [email or web address] URL: [email or web address] Date: 09/25/2023 Prepared by: Grier Rocher  Exercises - Seated Hip Abduction with Resistance  - 1 x daily - 4 x weekly - 3 sets - 15 reps - 3 hold - Seated March with Resistance  - 1 x daily -  4 x weekly - 3 sets - 10 reps - 1 hold - Seated Long Arc Quad  - 1 x daily - 4 x weekly - 3 sets - 12 reps - 3 hold - Seated Hip Adduction Isometrics with Ball  - 1 x daily - 4 x weekly - 3 sets - 12 reps - 3 hold - Standing March with Counter Support  - 1 x daily - 3 x weekly - 2 sets - 10 reps - Standing Hip Abduction with Counter Support  - 1 x daily - 3 x weekly - 2 sets - 10 reps - Standing Hip Extension with Counter Support  - 1 x daily - 3 x weekly - 2 sets - 10 reps - Mini Squat with Counter Support  - 1 x daily - 3 x weekly - 2 sets - 10 reps - Standing Knee Flexion with Counter Support  - 1 x daily - 3 x weekly - 2 sets - 10 reps - Standing Tandem Balance with Counter Support  - 1 x daily - 3 x weekly - 2 sets - 4 reps - 10 hold  GOALS: Goals  reviewed with patient? Yes   SHORT TERM GOALS: Target date: 12/28/23    Patient will be independent in home exercise program to improve strength/mobility for better functional independence with ADLs. Baseline:to be given at next session; 10/23/2023- Patient reports independence with HEP provided and no significant issues.   Goal status: MET 2. Pt will be able to ambulate for at least 5 min without stopping with LRAD to allow improved access to community and return to PLOF  Baseline: 3 minutes. 2/11: 6 minute walk test completed.   Goal Status: MET   . LONG TERM GOALS: Target date: 04/23/2024   Patient will increase FOTO score to equal to or greater than  61   to demonstrate statistically significant improvement in mobility and quality of life.  Baseline: 56; 10/23/2023= 60 Goal status: PROGRESSING/DISCONTINUED   2.  Patient (> 56 years old) will complete five times sit to stand test in < 15 seconds  without UE support indicating an increased LE strength and improved balance. Baseline: 30.86 sec; 10/23/2023= 22.08 using 1 UE support.  12/11: 14.67 sec BUE from arm rest. And 14.12 with 1 UE pushing from arm rest. 01/30/24 13 seconds with UE  3/26: 15.0  able to achieve full stand on each repetition  Goal status: MET  3.  Patient will increase Berg Balance score to >45 points to demonstrate decreased fall risk during functional activities  Baseline: 26; 10/23/2023= 30 12/11: 34 01/30/24: 41/56 3/26: 40/56 Goal status: MET/ Adjusted   4.  Patient will increase 10 meter walk test to >1.75m/s as to improve gait speed for better community ambulation and to reduce fall risk. Baseline: 0.621m/s; 10/23/2023= 0.88 m/s (normal speed with 4WW) 12/11: Average Fast speed: 1.05 m/s Average Normal speed: 0.834 m/s 01/30/24: 1.14 m/s 3/26: 1.0 m/s Goal status: MET  5.  Patient will reduce timed up and go to <11 seconds to reduce fall risk and demonstrate improved transfer/gait ability. Baseline:  29.39 12/2: 16.4 sec with Rollator  12/11: 15.19 sec with rollator  2/4: 17.38 sec with rollator  3/4: 15 sec with rollator  3/26: 13.76 sec with rollator  Goal status: IN PROGRESS  6.  Patient will 2 min walk test by at least 24ft as to demonstrate reduced fall risk and improved dynamic gait balance for better safety with community/home ambulation.   Baseline:  280ft; 10/23/2023= 360 feet  with 4WW 355ft with 4WW Goal status: MET  7. Pt will complete 6 min walk test without stopping and improve overall score by at least 144ft to indicate increased access to community.   Baseline: to be complete 2/11:880 ft with RW; 01/30/24: 1168 ft with RW  3/26: 1151ft with Rollator  Goal: MET    ASSESSMENT: CLINICAL IMPRESSION:  Patient arrived with good motivation and participated well during session. She continues to demonstrate progress with gait quality using hurry cane with CGA today. She was challenged with exhibited posterior LE weakness and will continue to benefit from progressive LE strength, safety with mobility training using cane, and progressive balance interventions including vestibular focused activities. Ms. Mabry will continue to benefit from skilled physical therapy intervention to address stated impairments, improve QOL, decrease fall risk, and attain therapy goals.       OBJECTIVE IMPAIRMENTS: Abnormal gait, cardiopulmonary status limiting activity, decreased activity tolerance, decreased balance, decreased endurance, decreased mobility, difficulty walking, decreased ROM, decreased strength, decreased safety awareness, hypomobility, increased fascial restrictions, impaired flexibility, improper body mechanics, and postural dysfunction.   ACTIVITY LIMITATIONS: lifting, standing, squatting, transfers, and locomotion level  PARTICIPATION LIMITATIONS: laundry, shopping, and community activity  PERSONAL FACTORS: 3+ comorbidities: scoliosis, hx of CA, and PE  are also affecting  patient's functional outcome.   REHAB POTENTIAL: Good  CLINICAL DECISION MAKING: Stable/uncomplicated  EVALUATION COMPLEXITY: Moderate  PLAN:  PT FREQUENCY: 1-2x/week  PT DURATION: 12 weeks  PLANNED INTERVENTIONS: Therapeutic exercises, Therapeutic activity, Neuromuscular re-education, Balance training, Gait training, Patient/Family education, Self Care, Joint mobilization, Stair training, Vestibular training, DME instructions, Dry Needling, Spinal mobilization, Cryotherapy, Moist heat, and Manual therapy  PLAN FOR NEXT SESSION: Continue Dynamic balance and gait training. Progressing to higher level variable direction stepping and reaching tasks  BLE and postural strengthening. Goal to ambulate with hurricane   Louis Meckel, PT Physical Therapist - [facility]  [facility]  1:21 PM 03/08/24

## 2024-03-12 ENCOUNTER — Ambulatory Visit: Payer: Medicare Other

## 2024-03-12 DIAGNOSIS — R262 Difficulty in walking, not elsewhere classified: Secondary | ICD-10-CM

## 2024-03-12 DIAGNOSIS — R2681 Unsteadiness on feet: Secondary | ICD-10-CM

## 2024-03-12 DIAGNOSIS — M6281 Muscle weakness (generalized): Secondary | ICD-10-CM

## 2024-03-12 DIAGNOSIS — R2689 Other abnormalities of gait and mobility: Secondary | ICD-10-CM

## 2024-03-14 ENCOUNTER — Ambulatory Visit: Payer: Medicare Other

## 2024-03-14 ENCOUNTER — Ambulatory Visit

## 2024-03-14 DIAGNOSIS — R2689 Other abnormalities of gait and mobility: Secondary | ICD-10-CM

## 2024-03-14 DIAGNOSIS — R2681 Unsteadiness on feet: Secondary | ICD-10-CM

## 2024-03-14 DIAGNOSIS — M6281 Muscle weakness (generalized): Secondary | ICD-10-CM

## 2024-03-14 DIAGNOSIS — M5459 Other low back pain: Secondary | ICD-10-CM

## 2024-03-14 DIAGNOSIS — R262 Difficulty in walking, not elsewhere classified: Secondary | ICD-10-CM | POA: Diagnosis not present

## 2024-03-14 DIAGNOSIS — R278 Other lack of coordination: Secondary | ICD-10-CM

## 2024-03-14 NOTE — Therapy (Signed)
 OUTPATIENT PHYSICAL THERAPY NEURO TREATMENT NOTE      Patient Name: Kristina Rowe MRN: [id number] DOB:24-Feb-1930, 89 y.o., female Today's Date: 03/14/2024   PCP: Rex Castor, MD  REFERRING PROVIDER:   Clarinda Crome, PA-C    END OF SESSION:   PT End of Session - 03/14/24 1109     Visit Number 34    Number of Visits 38    Date for PT Re-Evaluation 04/23/24    Progress Note Due on Visit 40    PT Start Time 1109    PT Stop Time 1145    PT Time Calculation (min) 36 min    Equipment Utilized During Treatment Gait belt    Activity Tolerance Patient tolerated treatment well;No increased pain    Behavior During Therapy WFL for tasks assessed/performed                  Past Medical History:  Diagnosis Date   Ductal carcinoma in situ (DCIS) of left breast    Dysphagia    Esophageal stricture    Gastroparesis    GERD (gastroesophageal reflux disease)    Hiatal hernia    Hypertension    Iron deficiency anemia    Osteoporosis    Pulmonary embolism (HCC)    Scoliosis    UTI (urinary tract infection)    Past Surgical History:  Procedure Laterality Date   BREAST LUMPECTOMY Left 2009   Ductal Carcinoma insitu    CATARACT EXTRACTION, BILATERAL     COLONOSCOPY N/A 03/17/2021   Procedure: COLONOSCOPY;  Surgeon: Shane Darling, MD;  Location: [facility];  Service: Endoscopy;  Laterality: N/A;   COLOSTOMY REVISION Right 03/18/2021   Procedure: COLON RESECTION RIGHT;  Surgeon: Emmalene Hare, MD;  Location: [facility];  Service: General;  Laterality: Right;   LAPAROSCOPIC PARAESOPHAGEAL HERNIA REPAIR  2009   LAPAROTOMY N/A 03/10/2021   Procedure: EXPLORATORY LAPAROTOMY;  Surgeon: Emmalene Hare, MD;  Location: [facility];  Service: General;  Laterality: N/A;   TUBAL LIGATION     Patient Active Problem List   Diagnosis Date Noted   Acute on chronic diastolic CHF (congestive heart failure) (HCC) 08/19/2023   Acute respiratory failure with hypoxia (HCC)  08/17/2023   History of pulmonary embolus (PE) 08/17/2023   Basal cell carcinoma of leg 04/01/2021   Coronary atherosclerosis 04/01/2021   History of pulmonary embolism 04/01/2021   Pulmonary nodule 04/01/2021   Malignant neoplasm of colon (HCC) 03/29/2021   Rectal bleeding 03/15/2021   Hypertension    GERD (gastroesophageal reflux disease)    Colonic mass    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HCC) 03/10/2021   Anemia, unspecified 03/06/2021   Bronchiectasis without complication (HCC) 03/27/2020   Iron deficiency anemia 03/27/2020   Moderate aortic stenosis 03/27/2020   Stage 3a chronic kidney disease (HCC) 07/04/2019   Hypnic headache 05/28/2019   Telogen effluvium 04/17/2019   Xerosis cutis 04/17/2019   Essential hypertension 01/14/2019   Cervical radiculopathy 05/04/2018   Osteoarthritis of left glenohumeral joint 05/04/2018   Vitamin B12 deficiency 01/01/2018   Vascular abnormality 09/11/2017   Dyspepsia 03/28/2017   Pulmonary embolism (HCC) 08/16/2016   Dilated pore of [place] 03/23/2015   Retinal drusen of both eyes 08/28/2014   Status post right cataract extraction 07/30/2014   Verruca 03/19/2014   Chronic back pain 11/06/2013   Postmenopausal osteoporosis 06/11/2013   Anticoagulated on Coumadin 04/06/2013   Long term (current) use of anticoagulants 10/18/2012   Preglaucoma 05/10/2012   Presbyopia 05/10/2012   Senile  nuclear sclerosis 05/10/2012   Lobular carcinoma in situ of breast 04/13/2012   Closed fracture of ankle 02/27/2012   History of nonmelanoma skin cancer 09/05/2011   Other benign neoplasm of skin of trunk 09/05/2011   Osteoporosis 08/25/2011   Transient alteration of awareness 09/03/2009   Colon adenoma 07/30/2002    ONSET DATE: 3 months   REFERRING DIAG:  Diagnosis  R53.83 (ICD-10-CM) - Other fatigue    THERAPY DIAG:  Difficulty in walking, not elsewhere classified  Muscle weakness (generalized)  Other abnormalities of gait and  mobility  Unsteadiness on feet  Other low back pain  Other lack of coordination  Rationale for Evaluation and Treatment: Rehabilitation  SUBJECTIVE:                                                                                                                                                                                             SUBJECTIVE STATEMENT: Patient reports "better late than never" referring to being a few min late to today's appointment and states always having some back pain but doing okay overall.       Pt accompanied by: self  PERTINENT HISTORY:  From recent MD visit.  89 y.o. here for an acute issue. She has a PMH of chronic PE/Coumadin, anemia, colon cancer, breast cancer, large hiatal hernia, moderate aortic stenosis who has concerns about shortness of breath, nausea, generalized weakness. No chest pain. History of hiatal hernia and recent CT chest. Also recent endoscopy. Takes Zofran chronically. Has felt weaker and almost fell recently. Uses a walker. Also on chronic UTI suppression, Keflex.   PAIN:  Are you having pain? Yes: NPRS scale: 5/10 Pain location: low back  Pain description: sore  Aggravating factors: movement  Relieving factors: rest   PRECAUTIONS: Fall  RED FLAGS: None   WEIGHT BEARING RESTRICTIONS: No  FALLS: Has patient fallen in last 6 months? No  LIVING ENVIRONMENT: Lives with: lives alone Lives in: House/apartment Stairs: Yes: External: 1 steps; none Has following equipment at home: Walker - 4 wheeled  PLOF: Independent with household mobility with device and Independent with community mobility with device  PATIENT GOALS: improved strength in BLE   OBJECTIVE:   DIAGNOSTIC FINDINGS:  06/12/2023 EXAM: CT CHEST, ABDOMEN, AND PELVIS WITH CONTRAST IMPRESSION: 1. Status post right hemicolectomy, without recurrent or metastatic disease. Decreased sensitivity exam, primarily involving the abdomen pelvis, due to paucity of  fat. 2. New tiny left groin hernia containing fluid. Consider physical exam correlation. 3. Incidental findings, including: Coronary artery atherosclerosis. Aortic Atherosclerosis (ICD10-I70.0). Dilated esophagus with contrast throughout, suggesting dysmotility and/or gastroesophageal reflux.  COGNITION: Overall cognitive  status: Within functional limits for tasks assessed   SENSATION: Light touch: Impaired  mild paraesthesia in the proximal RLE   COORDINATION: WFL. Ankle to knee limited ROM due to weakness in the R hip      POSTURE: rounded shoulders, forward head, flexed trunk , and severe scoliosis     LOWER EXTREMITY MMT:    MMT Right Eval Left Eval  Hip flexion 4- 4  Hip extension    Hip abduction 4- 44  Hip adduction 4 4  Hip internal rotation    Hip external rotation    Knee flexion 4- 4  Knee extension 4 4+  Ankle dorsiflexion 4 4+  Ankle plantarflexion    Ankle inversion    Ankle eversion    (Blank rows = not tested)    TRANSFERS: Assistive device utilized: Environmental consultant - 4 wheeled  Sit to stand: SBA Stand to sit: SBA Chair to chair: SBA Floor:  Unable to perform    STAIRS: Level of Assistance:  pt declines Stair Negotiation Technique: Step to Pattern with Bilateral Rails Number of Stairs: 4  Height of Stairs: 6  Comments: will need to assess.   GAIT: Gait pattern: step through pattern, Right foot flat, and lateral hip instability Distance walked: 255ft Assistive device utilized: Walker - 4 wheeled Level of assistance: Modified independence and SBA Comments: severe scoliosis.    FUNCTIONAL TESTS:  5 times sit to stand: 30.86 Timed up and go (TUG): 29.39 2 minute walk test: 267ft  10 meter walk test: 0.660m/s, SOB 4-5.  Berg Balance Scale: TBD  PATIENT SURVEYS:  FOTO 56. goal 64  TODAY'S TREATMENT:                                                                                                                              DATE:   03/14/2024    Gait training ~360 feet using hurricane with CGA for safety - No significant LOB or foot drag. Minimal VC for gait sequencing  NMR:   Dynamic stepping balance challenges as follows:  Step tap onto airex pad (no UE support) x 20 reps alt LE Step up onto airex pad- 2 sets of 10 reps each LE (BUE support)  Step and reach activity- Up/over 1/2 foam to left or right to tap appropriate sticky note (marked by letters A-D)  Dynamic step up/over 1/2 foam roll (forward without UE support and backward using BUE Support) - using mirror to look ahead for posture.   Posture activities:  - Seated shoulder horizontal ABD (active) 2 x 10 reps  -Seated shoulder Flex/ext (VC for technique and to sit upright without back supported) 2 x 10 reps  - Seated shoulder roll x 10 reps  -Seated scap retraction 2 x 10 reps      PATIENT EDUCATION: Education details: Pt educated throughout session about proper posture and technique with exercises. Improved exercise technique, movement at target joints, use of target muscles after min to  mod verbal, visual, tactile cues.  Person educated: Patient Education method: Explanation Education comprehension: verbalized understanding and returned demonstration  HOME EXERCISE PROGRAM: Access Code: [email or web address] URL: [email or web address] Date: 09/25/2023 Prepared by: Aurora Lees  Exercises - Seated Hip Abduction with Resistance  - 1 x daily - 4 x weekly - 3 sets - 15 reps - 3 hold - Seated March with Resistance  - 1 x daily - 4 x weekly - 3 sets - 10 reps - 1 hold - Seated Long Arc Quad  - 1 x daily - 4 x weekly - 3 sets - 12 reps - 3 hold - Seated Hip Adduction Isometrics with Ball  - 1 x daily - 4 x weekly - 3 sets - 12 reps - 3 hold - Standing March with Counter Support  - 1 x daily - 3 x weekly - 2 sets - 10 reps - Standing Hip Abduction with Counter Support  - 1 x daily - 3 x weekly - 2 sets - 10 reps - Standing Hip Extension with Counter  Support  - 1 x daily - 3 x weekly - 2 sets - 10 reps - Mini Squat with Counter Support  - 1 x daily - 3 x weekly - 2 sets - 10 reps - Standing Knee Flexion with Counter Support  - 1 x daily - 3 x weekly - 2 sets - 10 reps - Standing Tandem Balance with Counter Support  - 1 x daily - 3 x weekly - 2 sets - 4 reps - 10 hold  GOALS: Goals reviewed with patient? Yes   SHORT TERM GOALS: Target date: 12/28/23    Patient will be independent in home exercise program to improve strength/mobility for better functional independence with ADLs. Baseline:to be given at next session; 10/23/2023- Patient reports independence with HEP provided and no significant issues.   Goal status: MET 2. Pt will be able to ambulate for at least 5 min without stopping with LRAD to allow improved access to community and return to PLOF  Baseline: 3 minutes. 2/11: 6 minute walk test completed.   Goal Status: MET   . LONG TERM GOALS: Target date: 04/23/2024   Patient will increase FOTO score to equal to or greater than  61   to demonstrate statistically significant improvement in mobility and quality of life.  Baseline: 56; 10/23/2023= 60 Goal status: PROGRESSING/DISCONTINUED   2.  Patient (> 62 years old) will complete five times sit to stand test in < 15 seconds  without UE support indicating an increased LE strength and improved balance. Baseline: 30.86 sec; 10/23/2023= 22.08 using 1 UE support.  12/11: 14.67 sec BUE from arm rest. And 14.12 with 1 UE pushing from arm rest. 01/30/24 13 seconds with UE  3/26: 15.0  able to achieve full stand on each repetition  Goal status: MET  3.  Patient will increase Berg Balance score to >45 points to demonstrate decreased fall risk during functional activities  Baseline: 26; 10/23/2023= 30 12/11: 34 01/30/24: 41/56 3/26: 40/56 Goal status: MET/ Adjusted   4.  Patient will increase 10 meter walk test to >1.62m/s as to improve gait speed for better community ambulation and to  reduce fall risk. Baseline: 0.663m/s; 10/23/2023= 0.88 m/s (normal speed with 4WW) 12/11: Average Fast speed: 1.05 m/s Average Normal speed: 0.834 m/s 01/30/24: 1.14 m/s 3/26: 1.0 m/s Goal status: MET  5.  Patient will reduce timed up and go to <11 seconds to reduce fall risk and demonstrate improved  transfer/gait ability. Baseline: 29.39 12/2: 16.4 sec with Rollator  12/11: 15.19 sec with rollator  2/4: 17.38 sec with rollator  3/4: 15 sec with rollator  3/26: 13.76 sec with rollator  Goal status: IN PROGRESS  6.  Patient will 2 min walk test by at least 44ft as to demonstrate reduced fall risk and improved dynamic gait balance for better safety with community/home ambulation.   Baseline: 270ft; 10/23/2023= 360 feet  with 4WW 352ft with 4WW Goal status: MET  7. Pt will complete 6 min walk test without stopping and improve overall score by at least 190ft to indicate increased access to community.   Baseline: to be complete 2/11:880 ft with RW; 01/30/24: 1168 ft with RW  3/26: 1115ft with Rollator  Goal: MET    ASSESSMENT: CLINICAL IMPRESSION: Patient continued to be challenged with progressive balance activities- but improving as seen by ability to perform with less reliance on hand support. She was also able to perform some activities related to postural strengthening without report of pain today.   Ms. Harrington will continue to benefit from skilled physical therapy intervention to address stated impairments, improve QOL, decrease fall risk, and attain therapy goals.       OBJECTIVE IMPAIRMENTS: Abnormal gait, cardiopulmonary status limiting activity, decreased activity tolerance, decreased balance, decreased endurance, decreased mobility, difficulty walking, decreased ROM, decreased strength, decreased safety awareness, hypomobility, increased fascial restrictions, impaired flexibility, improper body mechanics, and postural dysfunction.   ACTIVITY LIMITATIONS: lifting, standing,  squatting, transfers, and locomotion level  PARTICIPATION LIMITATIONS: laundry, shopping, and community activity  PERSONAL FACTORS: 3+ comorbidities: scoliosis, hx of CA, and PE  are also affecting patient's functional outcome.   REHAB POTENTIAL: Good  CLINICAL DECISION MAKING: Stable/uncomplicated  EVALUATION COMPLEXITY: Moderate  PLAN:  PT FREQUENCY: 1-2x/week  PT DURATION: 12 weeks  PLANNED INTERVENTIONS: Therapeutic exercises, Therapeutic activity, Neuromuscular re-education, Balance training, Gait training, Patient/Family education, Self Care, Joint mobilization, Stair training, Vestibular training, DME instructions, Dry Needling, Spinal mobilization, Cryotherapy, Moist heat, and Manual therapy  PLAN FOR NEXT SESSION: Continue Dynamic balance and gait training. Progressing to higher level variable direction stepping and reaching tasks  BLE and postural strengthening. Goal to ambulate with hurricane    Ossie Blend, PT Physical Therapist - [facility]  [facility]  2:36 PM 03/14/24

## 2024-03-19 ENCOUNTER — Ambulatory Visit: Payer: Medicare Other

## 2024-03-19 DIAGNOSIS — M5459 Other low back pain: Secondary | ICD-10-CM

## 2024-03-19 DIAGNOSIS — R2689 Other abnormalities of gait and mobility: Secondary | ICD-10-CM

## 2024-03-19 DIAGNOSIS — R2681 Unsteadiness on feet: Secondary | ICD-10-CM

## 2024-03-19 DIAGNOSIS — R262 Difficulty in walking, not elsewhere classified: Secondary | ICD-10-CM | POA: Diagnosis not present

## 2024-03-19 DIAGNOSIS — M6281 Muscle weakness (generalized): Secondary | ICD-10-CM

## 2024-03-19 DIAGNOSIS — R278 Other lack of coordination: Secondary | ICD-10-CM

## 2024-03-19 NOTE — Therapy (Signed)
 OUTPATIENT PHYSICAL THERAPY NEURO TREATMENT NOTE      Patient Name: Kristina Rowe MRN: [id number] DOB:1930-01-30, 89 y.o., female Today's Date: 03/20/2024   PCP: Rex Castor, MD  REFERRING PROVIDER:   Clarinda Crome, PA-C    END OF SESSION:   PT End of Session - 03/19/24 1400     Visit Number 35    Number of Visits 59    Date for PT Re-Evaluation 04/23/24    Progress Note Due on Visit 40    PT Start Time 1401    PT Stop Time 1443    PT Time Calculation (min) 42 min    Equipment Utilized During Treatment Gait belt    Activity Tolerance Patient tolerated treatment well;No increased pain    Behavior During Therapy WFL for tasks assessed/performed                   Past Medical History:  Diagnosis Date   Ductal carcinoma in situ (DCIS) of left breast    Dysphagia    Esophageal stricture    Gastroparesis    GERD (gastroesophageal reflux disease)    Hiatal hernia    Hypertension    Iron deficiency anemia    Osteoporosis    Pulmonary embolism (HCC)    Scoliosis    UTI (urinary tract infection)    Past Surgical History:  Procedure Laterality Date   BREAST LUMPECTOMY Left 2009   Ductal Carcinoma insitu    CATARACT EXTRACTION, BILATERAL     COLONOSCOPY N/A 03/17/2021   Procedure: COLONOSCOPY;  Surgeon: Shane Darling, MD;  Location: [facility];  Service: Endoscopy;  Laterality: N/A;   COLOSTOMY REVISION Right 03/18/2021   Procedure: COLON RESECTION RIGHT;  Surgeon: Emmalene Hare, MD;  Location: [facility];  Service: General;  Laterality: Right;   LAPAROSCOPIC PARAESOPHAGEAL HERNIA REPAIR  2009   LAPAROTOMY N/A 03/10/2021   Procedure: EXPLORATORY LAPAROTOMY;  Surgeon: Emmalene Hare, MD;  Location: [facility] OR[facility];  Service: General;  Laterality: N/A;   TUBAL LIGATION     Patient Active Problem List   Diagnosis Date Noted   Acute on chronic diastolic CHF (congestive heart failure) (HCC) 08/19/2023   Acute respiratory failure with hypoxia (HCC)  08/17/2023   History of pulmonary embolus (PE) 08/17/2023   Basal cell carcinoma of leg 04/01/2021   Coronary atherosclerosis 04/01/2021   History of pulmonary embolism 04/01/2021   Pulmonary nodule 04/01/2021   Malignant neoplasm of colon (HCC) 03/29/2021   Rectal bleeding 03/15/2021   Hypertension    GERD (gastroesophageal reflux disease)    Colonic mass    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HCC) 03/10/2021   Anemia, unspecified 03/06/2021   Bronchiectasis without complication (HCC) 03/27/2020   Iron deficiency anemia 03/27/2020   Moderate aortic stenosis 03/27/2020   Stage 3a chronic kidney disease (HCC) 07/04/2019   Hypnic headache 05/28/2019   Telogen effluvium 04/17/2019   Xerosis cutis 04/17/2019   Essential hypertension 01/14/2019   Cervical radiculopathy 05/04/2018   Osteoarthritis of left glenohumeral joint 05/04/2018   Vitamin B12 deficiency 01/01/2018   Vascular abnormality 09/11/2017   Dyspepsia 03/28/2017   Pulmonary embolism (HCC) 08/16/2016   Dilated pore of [place] 03/23/2015   Retinal drusen of both eyes 08/28/2014   Status post right cataract extraction 07/30/2014   Verruca 03/19/2014   Chronic back pain 11/06/2013   Postmenopausal osteoporosis 06/11/2013   Anticoagulated on Coumadin  04/06/2013   Long term (current) use of anticoagulants 10/18/2012   Preglaucoma 05/10/2012   Presbyopia 05/10/2012  Senile nuclear sclerosis 05/10/2012   Lobular carcinoma in situ of breast 04/13/2012   Closed fracture of ankle 02/27/2012   History of nonmelanoma skin cancer 09/05/2011   Other benign neoplasm of skin of trunk 09/05/2011   Osteoporosis 08/25/2011   Transient alteration of awareness 09/03/2009   Colon adenoma 07/30/2002    ONSET DATE: 3 months   REFERRING DIAG:  Diagnosis  R53.83 (ICD-10-CM) - Other fatigue    THERAPY DIAG:  Difficulty in walking, not elsewhere classified  Muscle weakness (generalized)  Other abnormalities of gait and  mobility  Unsteadiness on feet  Other low back pain  Other lack of coordination  Rationale for Evaluation and Treatment: Rehabilitation  SUBJECTIVE:                                                                                                                                                                                             SUBJECTIVE STATEMENT: Patient reports always having some pain but doing okay today.        Pt accompanied by: self  PERTINENT HISTORY:  From recent MD visit.  89 y.o. here for an acute issue. She has a PMH of chronic PE/Coumadin , anemia, colon cancer, breast cancer, large hiatal hernia, moderate aortic stenosis who has concerns about shortness of breath, nausea, generalized weakness. No chest pain. History of hiatal hernia and recent CT chest. Also recent endoscopy. Takes Zofran  chronically. Has felt weaker and almost fell recently. Uses a walker. Also on chronic UTI suppression, Keflex .   PAIN:  Are you having pain? Yes: NPRS scale: 5/10 Pain location: low back  Pain description: sore  Aggravating factors: movement  Relieving factors: rest   PRECAUTIONS: Fall  RED FLAGS: None   WEIGHT BEARING RESTRICTIONS: No  FALLS: Has patient fallen in last 6 months? No  LIVING ENVIRONMENT: Lives with: lives alone Lives in: House/apartment Stairs: Yes: External: 1 steps; none Has following equipment at home: Walker - 4 wheeled  PLOF: Independent with household mobility with device and Independent with community mobility with device  PATIENT GOALS: improved strength in BLE   OBJECTIVE:   DIAGNOSTIC FINDINGS:  06/12/2023 EXAM: CT CHEST, ABDOMEN, AND PELVIS WITH CONTRAST IMPRESSION: 1. Status post right hemicolectomy, without recurrent or metastatic disease. Decreased sensitivity exam, primarily involving the abdomen pelvis, due to paucity of fat. 2. New tiny left groin hernia containing fluid. Consider physical exam correlation. 3.  Incidental findings, including: Coronary artery atherosclerosis. Aortic Atherosclerosis (ICD10-I70.0). Dilated esophagus with contrast throughout, suggesting dysmotility and/or gastroesophageal reflux.  COGNITION: Overall cognitive status: Within functional limits for tasks assessed   SENSATION: Light touch: Impaired  mild  paraesthesia in the proximal RLE   COORDINATION: WFL. Ankle to knee limited ROM due to weakness in the R hip      POSTURE: rounded shoulders, forward head, flexed trunk , and severe scoliosis     LOWER EXTREMITY MMT:    MMT Right Eval Left Eval  Hip flexion 4- 4  Hip extension    Hip abduction 4- 44  Hip adduction 4 4  Hip internal rotation    Hip external rotation    Knee flexion 4- 4  Knee extension 4 4+  Ankle dorsiflexion 4 4+  Ankle plantarflexion    Ankle inversion    Ankle eversion    (Blank rows = not tested)    TRANSFERS: Assistive device utilized: Environmental consultant - 4 wheeled  Sit to stand: SBA Stand to sit: SBA Chair to chair: SBA Floor:  Unable to perform    STAIRS: Level of Assistance:  pt declines Stair Negotiation Technique: Step to Pattern with Bilateral Rails Number of Stairs: 4  Height of Stairs: 6  Comments: will need to assess.   GAIT: Gait pattern: step through pattern, Right foot flat, and lateral hip instability Distance walked: 213ft Assistive device utilized: Walker - 4 wheeled Level of assistance: Modified independence and SBA Comments: severe scoliosis.    FUNCTIONAL TESTS:  5 times sit to stand: 30.86 Timed up and go (TUG): 29.39 2 minute walk test: 240ft  10 meter walk test: 0.64m/s, SOB 4-5.  Berg Balance Scale: TBD  PATIENT SURVEYS:  FOTO 56. goal 64  TODAY'S TREATMENT:                                                                                                                              DATE:  03/19/2024    Gait training ~360 feet using hurricane with CGA for safety - No significant LOB or  foot drag. Minimal VC for gait sequencing  NMR:   Dynamic stepping balance challenges as follows:  Step tap onto 1st step x 20 reps alt LE Step up onto 2nd step x 20 reps Forward walking with Hurrycane- 10 feet - counting steps 7-8 steps Forward walking with cane- 10 MWT- x 4 - avg 1.0 m/s and 20-22 steps (all together)     Posture activities:  - Seated shoulder horizontal ABD (resistive with RTB)  2 x 10 reps  -Seated shoulder ext (VC for technique and to sit upright without back supported) with RTB  x 15 reps  - Seated posterior shoulder roll x 10 reps  -Seated scap retraction with RTB 2 x 10 reps      PATIENT EDUCATION: Education details: Pt educated throughout session about proper posture and technique with exercises. Improved exercise technique, movement at target joints, use of target muscles after min to mod verbal, visual, tactile cues.  Person educated: Patient Education method: Explanation Education comprehension: verbalized understanding and returned demonstration  HOME EXERCISE PROGRAM: Access Code: [email or web address] URL: [email or web address] Date: 09/25/2023 Prepared  by: Aurora Lees  Exercises - Seated Hip Abduction with Resistance  - 1 x daily - 4 x weekly - 3 sets - 15 reps - 3 hold - Seated March with Resistance  - 1 x daily - 4 x weekly - 3 sets - 10 reps - 1 hold - Seated Long Arc Quad  - 1 x daily - 4 x weekly - 3 sets - 12 reps - 3 hold - Seated Hip Adduction Isometrics with Ball  - 1 x daily - 4 x weekly - 3 sets - 12 reps - 3 hold - Standing March with Counter Support  - 1 x daily - 3 x weekly - 2 sets - 10 reps - Standing Hip Abduction with Counter Support  - 1 x daily - 3 x weekly - 2 sets - 10 reps - Standing Hip Extension with Counter Support  - 1 x daily - 3 x weekly - 2 sets - 10 reps - Mini Squat with Counter Support  - 1 x daily - 3 x weekly - 2 sets - 10 reps - Standing Knee Flexion with Counter Support  - 1 x daily - 3 x weekly - 2 sets  - 10 reps - Standing Tandem Balance with Counter Support  - 1 x daily - 3 x weekly - 2 sets - 4 reps - 10 hold  GOALS: Goals reviewed with patient? Yes   SHORT TERM GOALS: Target date: 12/28/23    Patient will be independent in home exercise program to improve strength/mobility for better functional independence with ADLs. Baseline:to be given at next session; 10/23/2023- Patient reports independence with HEP provided and no significant issues.   Goal status: MET 2. Pt will be able to ambulate for at least 5 min without stopping with LRAD to allow improved access to community and return to PLOF  Baseline: 3 minutes. 2/11: 6 minute walk test completed.   Goal Status: MET   . LONG TERM GOALS: Target date: 04/23/2024   Patient will increase FOTO score to equal to or greater than  61   to demonstrate statistically significant improvement in mobility and quality of life.  Baseline: 56; 10/23/2023= 60 Goal status: PROGRESSING/DISCONTINUED   2.  Patient (> 31 years old) will complete five times sit to stand test in < 15 seconds  without UE support indicating an increased LE strength and improved balance. Baseline: 30.86 sec; 10/23/2023= 22.08 using 1 UE support.  12/11: 14.67 sec BUE from arm rest. And 14.12 with 1 UE pushing from arm rest. 01/30/24 13 seconds with UE  3/26: 15.0  able to achieve full stand on each repetition  Goal status: MET  3.  Patient will increase Berg Balance score to >45 points to demonstrate decreased fall risk during functional activities  Baseline: 26; 10/23/2023= 30 12/11: 34 01/30/24: 41/56 3/26: 40/56 Goal status: MET/ Adjusted   4.  Patient will increase 10 meter walk test to >1.75m/s as to improve gait speed for better community ambulation and to reduce fall risk. Baseline: 0.620m/s; 10/23/2023= 0.88 m/s (normal speed with 4WW) 12/11: Average Fast speed: 1.05 m/s Average Normal speed: 0.834 m/s 01/30/24: 1.14 m/s 3/26: 1.0 m/s Goal status: MET  5.   Patient will reduce timed up and go to <11 seconds to reduce fall risk and demonstrate improved transfer/gait ability. Baseline: 29.39 12/2: 16.4 sec with Rollator  12/11: 15.19 sec with rollator  2/4: 17.38 sec with rollator  3/4: 15 sec with rollator  3/26: 13.76 sec  with rollator  Goal status: IN PROGRESS  6.  Patient will 2 min walk test by at least 80ft as to demonstrate reduced fall risk and improved dynamic gait balance for better safety with community/home ambulation.   Baseline: 26ft; 10/23/2023= 360 feet  with 4WW 392ft with 4WW Goal status: MET  7. Pt will complete 6 min walk test without stopping and improve overall score by at least 157ft to indicate increased access to community.   Baseline: to be complete 2/11:880 ft with RW; 01/30/24: 1168 ft with RW  3/26: 1158ft with Rollator  Goal: MET    ASSESSMENT: CLINICAL IMPRESSION: Patient performed well overall today- accepted cues for increased step length and able to demo progress with improved gait speed and decreased steps with 10 meter walk. She is performing all postural training without report of pain and able to complete reps with some resistance.  Ms. Harb will continue to benefit from skilled physical therapy intervention to address stated impairments, improve QOL, decrease fall risk, and attain therapy goals.       OBJECTIVE IMPAIRMENTS: Abnormal gait, cardiopulmonary status limiting activity, decreased activity tolerance, decreased balance, decreased endurance, decreased mobility, difficulty walking, decreased ROM, decreased strength, decreased safety awareness, hypomobility, increased fascial restrictions, impaired flexibility, improper body mechanics, and postural dysfunction.   ACTIVITY LIMITATIONS: lifting, standing, squatting, transfers, and locomotion level  PARTICIPATION LIMITATIONS: laundry, shopping, and community activity  PERSONAL FACTORS: 3+ comorbidities: scoliosis, hx of CA, and PE  are also  affecting patient's functional outcome.   REHAB POTENTIAL: Good  CLINICAL DECISION MAKING: Stable/uncomplicated  EVALUATION COMPLEXITY: Moderate  PLAN:  PT FREQUENCY: 1-2x/week  PT DURATION: 12 weeks  PLANNED INTERVENTIONS: Therapeutic exercises, Therapeutic activity, Neuromuscular re-education, Balance training, Gait training, Patient/Family education, Self Care, Joint mobilization, Stair training, Vestibular training, DME instructions, Dry Needling, Spinal mobilization, Cryotherapy, Moist heat, and Manual therapy  PLAN FOR NEXT SESSION: Continue Dynamic balance and gait training. Progressing to higher level variable direction stepping and reaching tasks  BLE and postural strengthening. Goal to ambulate with hurricane    Ossie Blend, PT Physical Therapist - [facility]  [facility]  8:40 AM 03/20/24

## 2024-03-20 ENCOUNTER — Encounter: Payer: Self-pay | Admitting: Surgery

## 2024-03-20 ENCOUNTER — Ambulatory Visit (INDEPENDENT_AMBULATORY_CARE_PROVIDER_SITE_OTHER): Admitting: Surgery

## 2024-03-20 VITALS — BP 98/63 | HR 80 | Temp 97.8°F | Ht 60.0 in | Wt 90.4 lb

## 2024-03-20 DIAGNOSIS — C182 Malignant neoplasm of ascending colon: Secondary | ICD-10-CM

## 2024-03-20 DIAGNOSIS — Z08 Encounter for follow-up examination after completed treatment for malignant neoplasm: Secondary | ICD-10-CM

## 2024-03-20 NOTE — Patient Instructions (Addendum)
 We will see you back in 1 year for a follow up. You have been placed in our recall system and will receive a letter with a return date and time to see Dr Mauri Sous      Please call the office if you have any questions or concerns.

## 2024-03-20 NOTE — Progress Notes (Signed)
 03/20/2024  History of Present Illness: Kristina Rowe is a 89 y.o. female s/p open right colectomy on 03/18/21 for a right colon cancer.  She is stage IIIB.  Patient declined chemotherapy and she's followed by Dr. Randy Buttery.  She reports that she's doing well.  Sometimes she has nausea or vomiting due to her large hiatal hernia, but otherwise denies any abdominal pain or blood in her stools.  Her weight has been stable.  She had a CT scan chest, abdomen, and pelvis on 12/27/23 which did not show any evidence of metastatic disease.  Her most recent CEA from 01/03/24 is 2.7.  Past Medical History: Past Medical History:  Diagnosis Date   Ductal carcinoma in situ (DCIS) of left breast    Dysphagia    Esophageal stricture    Gastroparesis    GERD (gastroesophageal reflux disease)    Hiatal hernia    Hypertension    Iron deficiency anemia    Osteoporosis    Pulmonary embolism (HCC)    Scoliosis    UTI (urinary tract infection)      Past Surgical History: Past Surgical History:  Procedure Laterality Date   BREAST LUMPECTOMY Left 2009   Ductal Carcinoma insitu    CATARACT EXTRACTION, BILATERAL     COLONOSCOPY N/A 03/17/2021   Procedure: COLONOSCOPY;  Surgeon: Shane Darling, MD;  Location: [facility];  Service: Endoscopy;  Laterality: N/A;   COLOSTOMY REVISION Right 03/18/2021   Procedure: COLON RESECTION RIGHT;  Surgeon: Emmalene Hare, MD;  Location: [facility] OR[facility];  Service: General;  Laterality: Right;   LAPAROSCOPIC PARAESOPHAGEAL HERNIA REPAIR  2009   LAPAROTOMY N/A 03/10/2021   Procedure: EXPLORATORY LAPAROTOMY;  Surgeon: Emmalene Hare, MD;  Location: [facility];  Service: General;  Laterality: N/A;   TUBAL LIGATION      Home Medications: Prior to Admission medications   Medication Sig Start Date End Date Taking? Authorizing Provider  acetaminophen  (TYLENOL ) 500 MG tablet Take 500 mg by mouth at bedtime as needed for mild pain, fever or headache.    [provider]  albuterol   (VENTOLIN  HFA) 108 (90 Base) MCG/ACT inhaler Inhale into the lungs. 12/01/23 11/30/24  [provider]  amLODipine  (NORVASC ) 2.5 MG tablet Take by mouth. 12/15/23 12/14/24  [provider]  Cholecalciferol  50 MCG (2000 UT) CAPS Take by mouth.    [provider]  estradiol  (ESTRACE ) 0.1 MG/GM vaginal cream Estrogen Cream Instruction Discard applicator Apply pea sized amount to tip of finger to urethra before bed. Wash hands well after application. Use Monday, Wednesday and Friday 11/27/23   Vaillancourt, Samantha, PA-C  famotidine  (PEPCID ) 40 MG tablet Take 40 mg by mouth 2 (two) times daily. 06/15/22   [provider]  folic acid  (FOLVITE ) 1 MG tablet Take 1 mg by mouth daily. 01/17/19   [provider]  furosemide  (LASIX ) 20 MG tablet Take 1 tablet (20 mg total) by mouth daily as needed for fluid or edema. 08/18/23 08/17/24  Aisha Hove, MD  mirtazapine  (REMERON ) 7.5 MG tablet Take 7.5 mg by mouth at bedtime. 08/15/23 08/14/24  [provider]  ondansetron  (ZOFRAN -ODT) 4 MG disintegrating tablet Take 1 tablet (4 mg total) by mouth every 8 (eight) hours as needed. 08/04/22   Borders, Carlene Che, NP  polyethylene glycol (MIRALAX  / GLYCOLAX ) 17 g packet Take 17 g by mouth daily.    [provider]  rivaroxaban  (XARELTO ) 10 MG TABS tablet Take 10 mg by mouth daily.    [provider]  Allergies: Allergies  Allergen Reactions   Metronidazole  Other (See Comments)    Other reaction(s): Other Mouth sores Mouth sores Mouth sores  Other reaction(s): Other (See Comments) Mouth sores Other reaction(s): Other  Mouth sores    Omeprazole Other (See Comments)    Other reaction(s): Arthralgias (intolerance) Other reaction(s): Other  Other reaction(s): Arthralgias (intolerance), Other (See Comments) Other reaction(s): Arthralgias (intolerance)  Other reaction(s): Other    Bactrim  [Sulfamethoxazole -Trimethoprim ] Nausea Only    Codeine Nausea And Vomiting and Nausea Only    Other reaction(s): Nausea Only But tolerates tramadol  But tolerates tramadol Other reaction(s): Nausea Only  But tolerates tramadol    Gemtesa  [Vibegron ] Nausea Only   Propoxyphene Other (See Comments)    But tolerates tramadol and tylenol     Review of Systems: Review of Systems  Constitutional:  Negative for chills and fever.  Respiratory:  Negative for shortness of breath.   Cardiovascular:  Negative for chest pain.  Gastrointestinal:  Negative for abdominal pain, nausea and vomiting.    Physical Exam BP 98/63   Pulse 80   Temp 97.8 F (36.6 C) (Oral)   Ht 5' (1.524 m)   Wt 90 lb 6.4 oz (41 kg)   SpO2 98%   BMI 17.66 kg/m  CONSTITUTIONAL: No acute distress HEENT:  Normocephalic, atraumatic, extraocular motion intact. RESPIRATORY:  Lungs are clear, and breath sounds are equal bilaterally. Normal respiratory effort without pathologic use of accessory muscles. CARDIOVASCULAR: Heart is regular without murmurs, gallops, or rubs. GI: The abdomen is soft, non-distended, non-tender to palpation.  No evidence of incisional hernia. NEUROLOGIC:  Motor and sensation is grossly normal.  Cranial nerves are grossly intact. PSYCH:  Alert and oriented to person, place and time. Affect is normal.  Labs/Imaging: CT chest, abdomen, pelvis on 12/27/23: IMPRESSION: 1. Status post partial right hemicolectomy. 2. No evidence of recurrent or metastatic disease in the chest, abdomen, or pelvis. 3. Moderate hiatal hernia with intrathoracic position of the gastric fundus. 4. Sigmoid diverticulosis without evidence of acute diverticulitis.   Aortic Atherosclerosis (ICD10-I70.0).  Assessment and Plan: This is a 89 y.o. female s/p right colectomy for stage IIIB colon cancer.  --Patient is doing well from cancer standpoint.  No evidence of recurrent or metastatic disease on her last CT scan from 12/27/23.  CEA is relatively stable.  She denies any  abdominal issues aside from her hiatal hernia issues.  Exam today is reassuring as well. --Patient will follow up with me in 1 year for another exam. --All of her questions have been answered.  I spent 20 minutes dedicated to the care of this patient on the date of this encounter to include pre-visit review of records, face-to-face time with the patient discussing diagnosis and management, and any post-visit coordination of care.   Marene Shape, MD [facility]

## 2024-03-21 ENCOUNTER — Ambulatory Visit: Payer: Medicare Other

## 2024-03-21 ENCOUNTER — Ambulatory Visit

## 2024-03-21 ENCOUNTER — Telehealth: Payer: Self-pay

## 2024-03-21 NOTE — Telephone Encounter (Signed)
 Patient Name: Kristina Rowe MRN: [id number] DOB:May 06, 1930, 89 y.o., female Today's Date: 03/21/2024  Pt contacted via telephone and Bolivar Bushman unable to leave voice mail informing of missed appointment as patient phone did not have VM set up.    Murlene Army, PT 03/21/2024, 3:56 PM

## 2024-03-26 ENCOUNTER — Ambulatory Visit: Payer: Medicare Other

## 2024-03-26 ENCOUNTER — Telehealth: Payer: Self-pay

## 2024-03-26 NOTE — Telephone Encounter (Signed)
 Patient Name: MURRIEL WAYE MRN: [id number] DOB:21-Apr-1930, 89 y.o., female Today's Date: 03/26/2024  Pt contacted via telephone and Bolivar Bushman spoke with patient. She stated she messed up the dates and didn't realize she had an appointment today. Author informed pt of future PT appointment date and time. She verbalized understanding.       Murlene Army, PT 03/26/2024, 12:09 PM

## 2024-03-28 ENCOUNTER — Ambulatory Visit: Payer: Medicare Other
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
## 2024-04-02 ENCOUNTER — Ambulatory Visit: Payer: Medicare Other

## 2024-04-02 ENCOUNTER — Ambulatory Visit: Attending: Physician Assistant

## 2024-04-02 DIAGNOSIS — R278 Other lack of coordination: Secondary | ICD-10-CM | POA: Diagnosis present

## 2024-04-02 DIAGNOSIS — M5459 Other low back pain: Secondary | ICD-10-CM | POA: Diagnosis present

## 2024-04-02 DIAGNOSIS — R2689 Other abnormalities of gait and mobility: Secondary | ICD-10-CM

## 2024-04-02 DIAGNOSIS — R2681 Unsteadiness on feet: Secondary | ICD-10-CM | POA: Diagnosis present

## 2024-04-02 DIAGNOSIS — M6281 Muscle weakness (generalized): Secondary | ICD-10-CM | POA: Diagnosis present

## 2024-04-02 DIAGNOSIS — R262 Difficulty in walking, not elsewhere classified: Secondary | ICD-10-CM | POA: Diagnosis present

## 2024-04-04 ENCOUNTER — Ambulatory Visit

## 2024-04-04 ENCOUNTER — Ambulatory Visit: Payer: Medicare Other

## 2024-04-08 ENCOUNTER — Inpatient Hospital Stay: Payer: Medicare Other | Attending: Oncology

## 2024-04-08 DIAGNOSIS — E538 Deficiency of other specified B group vitamins: Secondary | ICD-10-CM | POA: Diagnosis present

## 2024-04-08 DIAGNOSIS — D508 Other iron deficiency anemias: Secondary | ICD-10-CM

## 2024-04-08 MED ORDER — CYANOCOBALAMIN 1000 MCG/ML IJ SOLN
1000.0000 ug | INTRAMUSCULAR | Status: DC
  Administered 2024-04-08: 1000 ug via INTRAMUSCULAR
  Filled 2024-04-08: qty 1

## 2024-04-09 ENCOUNTER — Ambulatory Visit: Payer: Medicare Other

## 2024-04-09 DIAGNOSIS — R262 Difficulty in walking, not elsewhere classified: Secondary | ICD-10-CM

## 2024-04-09 DIAGNOSIS — M6281 Muscle weakness (generalized): Secondary | ICD-10-CM

## 2024-04-09 DIAGNOSIS — R278 Other lack of coordination: Secondary | ICD-10-CM

## 2024-04-09 DIAGNOSIS — R2681 Unsteadiness on feet: Secondary | ICD-10-CM

## 2024-04-09 DIAGNOSIS — M5459 Other low back pain: Secondary | ICD-10-CM

## 2024-04-09 DIAGNOSIS — R2689 Other abnormalities of gait and mobility: Secondary | ICD-10-CM

## 2024-04-09 NOTE — Therapy (Signed)
 OUTPATIENT PHYSICAL THERAPY NEURO TREATMENT NOTE      Patient Name: Kristina Rowe MRN: [id number] DOB:01/02/1930, 89 y.o., female Today's Date: 04/10/2024   PCP: Rex Castor, MD  REFERRING PROVIDER:   Clarinda Crome, PA-C    END OF SESSION:   PT End of Session - 04/09/24 1138     Visit Number 37    Number of Visits 59    Date for PT Re-Evaluation 04/23/24    Progress Note Due on Visit 40    PT Start Time 1138    PT Stop Time 1225    PT Time Calculation (min) 47 min    Equipment Utilized During Treatment Gait belt    Activity Tolerance Patient tolerated treatment well;No increased pain    Behavior During Therapy WFL for tasks assessed/performed                    Past Medical History:  Diagnosis Date   Ductal carcinoma in situ (DCIS) of left breast    Dysphagia    Esophageal stricture    Gastroparesis    GERD (gastroesophageal reflux disease)    Hiatal hernia    Hypertension    Iron deficiency anemia    Osteoporosis    Pulmonary embolism (HCC)    Scoliosis    UTI (urinary tract infection)    Past Surgical History:  Procedure Laterality Date   BREAST LUMPECTOMY Left 2009   Ductal Carcinoma insitu    CATARACT EXTRACTION, BILATERAL     COLONOSCOPY N/A 03/17/2021   Procedure: COLONOSCOPY;  Surgeon: Shane Darling, MD;  Location: [facility];  Service: Endoscopy;  Laterality: N/A;   COLOSTOMY REVISION Right 03/18/2021   Procedure: COLON RESECTION RIGHT;  Surgeon: Emmalene Hare, MD;  Location: [facility];  Service: General;  Laterality: Right;   LAPAROSCOPIC PARAESOPHAGEAL HERNIA REPAIR  2009   LAPAROTOMY N/A 03/10/2021   Procedure: EXPLORATORY LAPAROTOMY;  Surgeon: Emmalene Hare, MD;  Location: [facility] OR[facility];  Service: General;  Laterality: N/A;   TUBAL LIGATION     Patient Active Problem List   Diagnosis Date Noted   Acute on chronic diastolic CHF (congestive heart failure) (HCC) 08/19/2023   Acute respiratory failure with hypoxia (HCC)  08/17/2023   History of pulmonary embolus (PE) 08/17/2023   Basal cell carcinoma of leg 04/01/2021   Coronary atherosclerosis 04/01/2021   History of pulmonary embolism 04/01/2021   Pulmonary nodule 04/01/2021   Malignant neoplasm of colon (HCC) 03/29/2021   Rectal bleeding 03/15/2021   Hypertension    GERD (gastroesophageal reflux disease)    Colonic mass    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HCC) 03/10/2021   Anemia, unspecified 03/06/2021   Bronchiectasis without complication (HCC) 03/27/2020   Iron deficiency anemia 03/27/2020   Moderate aortic stenosis 03/27/2020   Stage 3a chronic kidney disease (HCC) 07/04/2019   Hypnic headache 05/28/2019   Telogen effluvium 04/17/2019   Xerosis cutis 04/17/2019   Essential hypertension 01/14/2019   Cervical radiculopathy 05/04/2018   Osteoarthritis of left glenohumeral joint 05/04/2018   Vitamin B12 deficiency 01/01/2018   Vascular abnormality 09/11/2017   Dyspepsia 03/28/2017   Pulmonary embolism (HCC) 08/16/2016   Dilated pore of [place] 03/23/2015   Retinal drusen of both eyes 08/28/2014   Status post right cataract extraction 07/30/2014   Verruca 03/19/2014   Chronic back pain 11/06/2013   Postmenopausal osteoporosis 06/11/2013   Anticoagulated on Coumadin  04/06/2013   Long term (current) use of anticoagulants 10/18/2012   Preglaucoma 05/10/2012   Presbyopia 05/10/2012  Senile nuclear sclerosis 05/10/2012   Lobular carcinoma in situ of breast 04/13/2012   Closed fracture of ankle 02/27/2012   History of nonmelanoma skin cancer 09/05/2011   Other benign neoplasm of skin of trunk 09/05/2011   Osteoporosis 08/25/2011   Transient alteration of awareness 09/03/2009   Colon adenoma 07/30/2002    ONSET DATE: 3 months   REFERRING DIAG:  Diagnosis  R53.83 (ICD-10-CM) - Other fatigue    THERAPY DIAG:  Difficulty in walking, not elsewhere classified  Muscle weakness (generalized)  Other abnormalities of gait and  mobility  Unsteadiness on feet  Other low back pain  Other lack of coordination  Rationale for Evaluation and Treatment: Rehabilitation  SUBJECTIVE:                                                                                                                                                                                             SUBJECTIVE STATEMENT:  Patient reports she had a good Mother's day. States no falls.      Pt accompanied by: self  PERTINENT HISTORY:  From recent MD visit.  89 y.o. here for an acute issue. She has a PMH of chronic PE/Coumadin , anemia, colon cancer, breast cancer, large hiatal hernia, moderate aortic stenosis who has concerns about shortness of breath, nausea, generalized weakness. No chest pain. History of hiatal hernia and recent CT chest. Also recent endoscopy. Takes Zofran  chronically. Has felt weaker and almost fell recently. Uses a walker. Also on chronic UTI suppression, Keflex .   PAIN:  Are you having pain? Yes: NPRS scale: 5/10 Pain location: low back  Pain description: sore  Aggravating factors: movement  Relieving factors: rest   PRECAUTIONS: Fall  RED FLAGS: None   WEIGHT BEARING RESTRICTIONS: No  FALLS: Has patient fallen in last 6 months? No  LIVING ENVIRONMENT: Lives with: lives alone Lives in: House/apartment Stairs: Yes: External: 1 steps; none Has following equipment at home: Walker - 4 wheeled  PLOF: Independent with household mobility with device and Independent with community mobility with device  PATIENT GOALS: improved strength in BLE   OBJECTIVE:   DIAGNOSTIC FINDINGS:  06/12/2023 EXAM: CT CHEST, ABDOMEN, AND PELVIS WITH CONTRAST IMPRESSION: 1. Status post right hemicolectomy, without recurrent or metastatic disease. Decreased sensitivity exam, primarily involving the abdomen pelvis, due to paucity of fat. 2. New tiny left groin hernia containing fluid. Consider physical exam correlation. 3.  Incidental findings, including: Coronary artery atherosclerosis. Aortic Atherosclerosis (ICD10-I70.0). Dilated esophagus with contrast throughout, suggesting dysmotility and/or gastroesophageal reflux.  COGNITION: Overall cognitive status: Within functional limits for tasks assessed   SENSATION: Light touch: Impaired  mild  paraesthesia in the proximal RLE   COORDINATION: WFL. Ankle to knee limited ROM due to weakness in the R hip      POSTURE: rounded shoulders, forward head, flexed trunk , and severe scoliosis     LOWER EXTREMITY MMT:    MMT Right Eval Left Eval  Hip flexion 4- 4  Hip extension    Hip abduction 4- 44  Hip adduction 4 4  Hip internal rotation    Hip external rotation    Knee flexion 4- 4  Knee extension 4 4+  Ankle dorsiflexion 4 4+  Ankle plantarflexion    Ankle inversion    Ankle eversion    (Blank rows = not tested)    TRANSFERS: Assistive device utilized: Environmental consultant - 4 wheeled  Sit to stand: SBA Stand to sit: SBA Chair to chair: SBA Floor: Unable to perform    STAIRS: Level of Assistance: pt declines Stair Negotiation Technique: Step to Pattern with Bilateral Rails Number of Stairs: 4  Height of Stairs: 6  Comments: will need to assess.   GAIT: Gait pattern: step through pattern, Right foot flat, and lateral hip instability Distance walked: 257ft Assistive device utilized: Walker - 4 wheeled Level of assistance: Modified independence and SBA Comments: severe scoliosis.    FUNCTIONAL TESTS:  5 times sit to stand: 30.86 Timed up and go (TUG): 29.39 2 minute walk test: 264ft  10 meter walk test: 0.641m/s, SOB 4-5.  Berg Balance Scale: TBD  PATIENT SURVEYS:  FOTO 56. goal 64  TODAY'S TREATMENT:                                                                                                                              DATE:  04/09/2024   Therapeutic Activities: dynamic therapeutic activities designed to achieve improved  functional performance:  Sit to stand: without UE support - attempted- unable so utilized 1UE support x 12 reps  Forward Lunges for improved step ability: at support bar with 1UE Support: 2 x 10 alt LE  Rear Lunges for improved step ability: at support bar with 1UE Support: 2 x 10 alt LE   6" step:  -toe taps 10x each LE -step up/down 15x each LE  -lateral step up/down 10x each LE with min UE Support   NMR:  Postural awareness: Standing in staggered postioin wit scap retraction(arms in) 2 x 10 with RTB Standing in staggered position with scap retraction(elbows flared) 2x10 with RTB Standing Shoulder horizontal ABD with RTB- 2 x 12 (VC for correct technique)    PATIENT EDUCATION: Education details: Pt educated throughout session about proper posture and technique with exercises. Improved exercise technique, movement at target joints, use of target muscles after min to mod verbal, visual, tactile cues.  Person educated: Patient Education method: Explanation Education comprehension: verbalized understanding and returned demonstration  HOME EXERCISE PROGRAM: Access Code: [email or web address] URL: [email or web address] Date: 09/25/2023 Prepared by: Aurora Lees  Exercises - Seated Hip Abduction with  Resistance  - 1 x daily - 4 x weekly - 3 sets - 15 reps - 3 hold - Seated March with Resistance  - 1 x daily - 4 x weekly - 3 sets - 10 reps - 1 hold - Seated Long Arc Quad  - 1 x daily - 4 x weekly - 3 sets - 12 reps - 3 hold - Seated Hip Adduction Isometrics with Ball  - 1 x daily - 4 x weekly - 3 sets - 12 reps - 3 hold - Standing March with Counter Support  - 1 x daily - 3 x weekly - 2 sets - 10 reps - Standing Hip Abduction with Counter Support  - 1 x daily - 3 x weekly - 2 sets - 10 reps - Standing Hip Extension with Counter Support  - 1 x daily - 3 x weekly - 2 sets - 10 reps - Mini Squat with Counter Support  - 1 x daily - 3 x weekly - 2 sets - 10 reps - Standing Knee  Flexion with Counter Support  - 1 x daily - 3 x weekly - 2 sets - 10 reps - Standing Tandem Balance with Counter Support  - 1 x daily - 3 x weekly - 2 sets - 4 reps - 10 hold  GOALS: Goals reviewed with patient? Yes   SHORT TERM GOALS: Target date: 12/28/23    Patient will be independent in home exercise program to improve strength/mobility for better functional independence with ADLs. Baseline:to be given at next session; 10/23/2023- Patient reports independence with HEP provided and no significant issues.   Goal status: MET 2. Pt will be able to ambulate for at least 5 min without stopping with LRAD to allow improved access to community and return to PLOF  Baseline: 3 minutes. 2/11: 6 minute walk test completed.   Goal Status: MET   . LONG TERM GOALS: Target date: 04/23/2024   Patient will increase FOTO score to equal to or greater than  61   to demonstrate statistically significant improvement in mobility and quality of life.  Baseline: 56; 10/23/2023= 60 Goal status: PROGRESSING/DISCONTINUED   2.  Patient (> 70 years old) will complete five times sit to stand test in < 15 seconds  without UE support indicating an increased LE strength and improved balance. Baseline: 30.86 sec; 10/23/2023= 22.08 using 1 UE support.  12/11: 14.67 sec BUE from arm rest. And 14.12 with 1 UE pushing from arm rest. 01/30/24 13 seconds with UE  3/26: 15.0  able to achieve full stand on each repetition  Goal status: MET  3.  Patient will increase Berg Balance score to >45 points to demonstrate decreased fall risk during functional activities  Baseline: 26; 10/23/2023= 30 12/11: 34 01/30/24: 41/56 3/26: 40/56 Goal status: MET/ Adjusted   4.  Patient will increase 10 meter walk test to >1.71m/s as to improve gait speed for better community ambulation and to reduce fall risk. Baseline: 0.619m/s; 10/23/2023= 0.88 m/s (normal speed with 4WW) 12/11: Average Fast speed: 1.05 m/s Average Normal speed: 0.834  m/s 01/30/24: 1.14 m/s 3/26: 1.0 m/s Goal status: MET  5.  Patient will reduce timed up and go to <11 seconds to reduce fall risk and demonstrate improved transfer/gait ability. Baseline: 29.39 12/2: 16.4 sec with Rollator  12/11: 15.19 sec with rollator  2/4: 17.38 sec with rollator  3/4: 15 sec with rollator  3/26: 13.76 sec with rollator  Goal status: IN PROGRESS  6.  Patient will 2 min walk test by at least 18ft as to demonstrate reduced fall risk and improved dynamic gait balance for better safety with community/home ambulation.   Baseline: 242ft; 10/23/2023= 360 feet  with 4WW 378ft with 4WW Goal status: MET  7. Pt will complete 6 min walk test without stopping and improve overall score by at least 128ft to indicate increased access to community.   Baseline: to be complete 2/11:880 ft with RW; 01/30/24: 1168 ft with RW  3/26: 117ft with Rollator  Goal: MET    ASSESSMENT: CLINICAL IMPRESSION:  Patient presents with good motivation for today's session. She was able to perform several postural activities and denied any increased back pain- Stating I can feel it but feels like I need it. She was later challenged with standing functional activities - VC for correct technique and still unable to stand from standard height chair without some UE support.  Ms. Thielemann will continue to benefit from skilled physical therapy intervention to address stated impairments, improve QOL, decrease fall risk, and attain therapy goals.       OBJECTIVE IMPAIRMENTS: Abnormal gait, cardiopulmonary status limiting activity, decreased activity tolerance, decreased balance, decreased endurance, decreased mobility, difficulty walking, decreased ROM, decreased strength, decreased safety awareness, hypomobility, increased fascial restrictions, impaired flexibility, improper body mechanics, and postural dysfunction.   ACTIVITY LIMITATIONS: lifting, standing, squatting, transfers, and locomotion  level  PARTICIPATION LIMITATIONS: laundry, shopping, and community activity  PERSONAL FACTORS: 3+ comorbidities: scoliosis, hx of CA, and PE are also affecting patient's functional outcome.   REHAB POTENTIAL: Good  CLINICAL DECISION MAKING: Stable/uncomplicated  EVALUATION COMPLEXITY: Moderate  PLAN:  PT FREQUENCY: 1-2x/week  PT DURATION: 12 weeks  PLANNED INTERVENTIONS: Therapeutic exercises, Therapeutic activity, Neuromuscular re-education, Balance training, Gait training, Patient/Family education, Self Care, Joint mobilization, Stair training, Vestibular training, DME instructions, Dry Needling, Spinal mobilization, Cryotherapy, Moist heat, and Manual therapy  PLAN FOR NEXT SESSION: Continue Dynamic balance and gait training. Progressing to higher level variable direction stepping and reaching tasks  BLE and postural strengthening.   Ossie Blend, PT Physical Therapist - [facility]  Outpatient Physical Therapy- [facility] [phone]    12:57 PM 04/10/24

## 2024-04-11 ENCOUNTER — Ambulatory Visit

## 2024-04-11 ENCOUNTER — Ambulatory Visit: Payer: Medicare Other

## 2024-04-16 ENCOUNTER — Ambulatory Visit (INDEPENDENT_AMBULATORY_CARE_PROVIDER_SITE_OTHER): Admitting: Urology

## 2024-04-16 ENCOUNTER — Encounter: Payer: Self-pay | Admitting: Urology

## 2024-04-16 ENCOUNTER — Ambulatory Visit: Payer: Medicare Other

## 2024-04-16 VITALS — BP 134/75 | HR 90 | Ht 60.0 in | Wt 87.0 lb

## 2024-04-16 DIAGNOSIS — N3281 Overactive bladder: Secondary | ICD-10-CM | POA: Diagnosis not present

## 2024-04-16 DIAGNOSIS — R3989 Other symptoms and signs involving the genitourinary system: Secondary | ICD-10-CM | POA: Diagnosis not present

## 2024-04-16 LAB — URINALYSIS, COMPLETE
Bilirubin, UA: NEGATIVE
Glucose, UA: NEGATIVE
Ketones, UA: NEGATIVE
Nitrite, UA: NEGATIVE
Protein,UA: NEGATIVE
RBC, UA: NEGATIVE
Specific Gravity, UA: 1.01 (ref 1.005–1.030)
Urobilinogen, Ur: 0.2 mg/dL (ref 0.2–1.0)
pH, UA: 6 (ref 5.0–7.5)

## 2024-04-16 LAB — BLADDER SCAN AMB NON-IMAGING

## 2024-04-16 LAB — MICROSCOPIC EXAMINATION

## 2024-04-16 MED ORDER — CEFUROXIME AXETIL 250 MG PO TABS: 250.0000 mg | ORAL_TABLET | Freq: Two times a day (BID) | ORAL | 0 refills | Status: AC

## 2024-04-16 NOTE — Progress Notes (Addendum)
 04/16/2024 4:38 PM   Buckley Card 1930-04-02 [id number]  Referring provider: Rex Castor, MD [address] [place],  [place] [address]  Urological history: 1. OAB - vaginal estrogen cream three times weekly   2. rUTI's - August 15, 2023, mixed urogenital flora - July 20, 2023, mixed urogenital flora - June 07, 2023, mixed urogenital flora - Apr 25, 2023, no growth  Chief Complaint  Patient presents with   Urinary Tract Infection   HPI: Kristina Rowe is a 89 y.o. woman who presents today for possible UTI.   Previous records reviewed.   The night before last she got up 3 times at night which is unusual for her and last night it was 4 times.  This is not her baseline.  Patient denies any modifying or aggravating factors.  Patient denies any recent UTI's, gross hematuria, dysuria or suprapubic/flank pain.  Patient denies any fevers, chills, nausea or vomiting.    Contrast CT (11/2023) - no worrisome GU findings   Today's UA yellow clear, specific area 1.010, pH 6.0, trace leuks, 11-30 WBCs, 0-2 RBCs, 0-10 epithelial cells, mucus at present and moderate bacteria.    PVR 0 mL   PMH: Past Medical History:  Diagnosis Date   Ductal carcinoma in situ (DCIS) of left breast    Dysphagia    Esophageal stricture    Gastroparesis    GERD (gastroesophageal reflux disease)    Hiatal hernia    Hypertension    Iron deficiency anemia    Osteoporosis    Pulmonary embolism (HCC)    Scoliosis    UTI (urinary tract infection)     Surgical History: Past Surgical History:  Procedure Laterality Date   BREAST LUMPECTOMY Left 2009   Ductal Carcinoma insitu    CATARACT EXTRACTION, BILATERAL     COLONOSCOPY N/A 03/17/2021   Procedure: COLONOSCOPY;  Surgeon: Shane Darling, MD;  Location: [facility];  Service: Endoscopy;  Laterality: N/A;   COLOSTOMY REVISION Right 03/18/2021   Procedure: COLON RESECTION RIGHT;  Surgeon: Emmalene Hare, MD;  Location:  [facility] OR[facility];  Service: General;  Laterality: Right;   LAPAROSCOPIC PARAESOPHAGEAL HERNIA REPAIR  2009   LAPAROTOMY N/A 03/10/2021   Procedure: EXPLORATORY LAPAROTOMY;  Surgeon: Emmalene Hare, MD;  Location: [facility] ORS;  Service: General;  Laterality: N/A;   TUBAL LIGATION      Home Medications:  Allergies as of 04/16/2024       Reactions   Metronidazole  Other (See Comments)   Other reaction(s): Other Mouth sores Mouth sores Mouth sores Other reaction(s): Other (See Comments) Mouth sores Other reaction(s): Other Mouth sores   Omeprazole Other (See Comments)   Other reaction(s): Arthralgias (intolerance) Other reaction(s): Other Other reaction(s): Arthralgias (intolerance), Other (See Comments) Other reaction(s): Arthralgias (intolerance) Other reaction(s): Other   Bactrim  [sulfamethoxazole -trimethoprim ] Nausea Only   Codeine Nausea And Vomiting, Nausea Only   Other reaction(s): Nausea Only But tolerates tramadol But tolerates tramadol Other reaction(s): Nausea Only But tolerates tramadol   Gemtesa  [vibegron ] Nausea Only   Propoxyphene Other (See Comments)   But tolerates tramadol and tylenol    Doxycycline Nausea Only        Medication List        Accurate as of Apr 16, 2024 11:59 PM. If you have any questions, ask your nurse or doctor.          acetaminophen  500 MG tablet Commonly known as: TYLENOL  Take 500 mg by mouth at bedtime as needed for mild pain,  fever or headache.   albuterol  108 (90 Base) MCG/ACT inhaler Commonly known as: VENTOLIN  HFA Inhale into the lungs.   amLODipine  2.5 MG tablet Commonly known as: NORVASC  Take by mouth.   cefUROXime  250 MG tablet Commonly known as: CEFTIN  Take 1 tablet (250 mg total) by mouth 2 (two) times daily with a meal for 7 days. Started by: Kree Rafter   Cholecalciferol  50 MCG (2000 UT) Caps Take by mouth.   estradiol  0.1 MG/GM vaginal cream Commonly known as: ESTRACE  Estrogen Cream Instruction Discard  applicator Apply pea sized amount to tip of finger to urethra before bed. Wash hands well after application. Use Monday, Wednesday and Friday   famotidine  40 MG tablet Commonly known as: PEPCID  Take 40 mg by mouth 2 (two) times daily.   folic acid  1 MG tablet Commonly known as: FOLVITE  Take 1 mg by mouth daily.   furosemide  20 MG tablet Commonly known as: Lasix  Take 1 tablet (20 mg total) by mouth daily as needed for fluid or edema.   mirtazapine  7.5 MG tablet Commonly known as: REMERON  Take 7.5 mg by mouth at bedtime.   ondansetron  4 MG disintegrating tablet Commonly known as: ZOFRAN -ODT Take 1 tablet (4 mg total) by mouth every 8 (eight) hours as needed.   polyethylene glycol 17 g packet Commonly known as: MIRALAX  / GLYCOLAX  Take 17 g by mouth daily.   rivaroxaban  10 MG Tabs tablet Commonly known as: XARELTO  Take 10 mg by mouth daily.        Allergies:  Allergies  Allergen Reactions   Metronidazole  Other (See Comments)    Other reaction(s): Other Mouth sores Mouth sores Mouth sores  Other reaction(s): Other (See Comments) Mouth sores Other reaction(s): Other  Mouth sores    Omeprazole Other (See Comments)    Other reaction(s): Arthralgias (intolerance) Other reaction(s): Other  Other reaction(s): Arthralgias (intolerance), Other (See Comments) Other reaction(s): Arthralgias (intolerance)  Other reaction(s): Other    Bactrim  [Sulfamethoxazole -Trimethoprim ] Nausea Only   Codeine Nausea And Vomiting and Nausea Only    Other reaction(s): Nausea Only But tolerates tramadol  But tolerates tramadol Other reaction(s): Nausea Only  But tolerates tramadol    Gemtesa  [Vibegron ] Nausea Only   Propoxyphene Other (See Comments)    But tolerates tramadol and tylenol    Doxycycline Nausea Only    Family History: Family History  Problem Relation Age of Onset   Colon cancer Mother    Emphysema Father    Cholelithiasis Sister    Factor V Leiden deficiency  Sister    Thyroid  disease Sister    Cholelithiasis Sister    Pulmonary embolism Sister    Deep vein thrombosis Brother    Liver cancer Brother    Stroke Brother        died age 19 May 2021, D-Day veteran   Cancer Brother        oral cancer   Breast cancer Maternal Uncle     Social History:  reports that she has never smoked. She has never been exposed to tobacco smoke. She has never used smokeless tobacco. She reports that she does not drink alcohol and does not use drugs.  ROS: Pertinent ROS in HPI  Physical Exam: BP 134/75   Pulse 90   Ht 5' (1.524 m)   Wt 87 lb (39.5 kg)   BMI 16.99 kg/m   Constitutional:  Well nourished. Alert and oriented, No acute distress. HEENT: [place] AT, moist mucus membranes.  Trachea midline Cardiovascular: No clubbing, cyanosis, or  edema. Respiratory: Normal respiratory effort, no increased work of breathing. Neurologic: Grossly intact, no focal deficits, moving all 4 extremities. Psychiatric: Normal mood and affect.    Laboratory Data: Lab Results  Component Value Date   WBC 5.8 01/03/2024   HGB 12.2 01/03/2024   HCT 38.3 01/03/2024   MCV 99.2 01/03/2024   PLT 213 01/03/2024   Lab Results  Component Value Date   CREATININE 0.87 01/03/2024   Lab Results  Component Value Date   AST 21 01/03/2024   Lab Results  Component Value Date   ALT 13 01/03/2024   Urinalysis See EPIC and HPI  I have reviewed the labs.   Pertinent Imaging:  04/16/24 14:09  Scan Result 0ml    Assessment & Plan:    1. Suspected UTI -UA grossly infected  -Urine culture pending -Started empirically on Ceftin  250 BID x 7 days, will adjust if necessary once urine culture and sensitivity results are available    2. OAB - keep follow up with [name] in December  Return for keep follow up with [name] .  These notes generated with voice recognition software. I apologize for typographical errors.  Briant Camper  [facility] [address]  [address] [place], [place] [address] [phone]

## 2024-04-17 ENCOUNTER — Ambulatory Visit

## 2024-04-17 DIAGNOSIS — M5459 Other low back pain: Secondary | ICD-10-CM

## 2024-04-17 DIAGNOSIS — R2681 Unsteadiness on feet: Secondary | ICD-10-CM

## 2024-04-17 DIAGNOSIS — M6281 Muscle weakness (generalized): Secondary | ICD-10-CM

## 2024-04-17 DIAGNOSIS — R278 Other lack of coordination: Secondary | ICD-10-CM

## 2024-04-17 DIAGNOSIS — R262 Difficulty in walking, not elsewhere classified: Secondary | ICD-10-CM | POA: Diagnosis not present

## 2024-04-17 DIAGNOSIS — R2689 Other abnormalities of gait and mobility: Secondary | ICD-10-CM

## 2024-04-17 NOTE — Therapy (Signed)
 OUTPATIENT PHYSICAL THERAPY NEURO TREATMENT NOTE      Patient Name: Kristina Rowe MRN: [id number] DOB:09/05/1930, 89 y.o., female Today's Date: 04/18/2024   PCP: Rex Castor, MD  REFERRING PROVIDER:   Clarinda Crome, PA-C    END OF SESSION:   PT End of Session - 04/17/24 1455     Visit Number 38    Number of Visits 59    Date for PT Re-Evaluation 04/23/24    Progress Note Due on Visit 40    PT Start Time 1450    PT Stop Time 1525    PT Time Calculation (min) 35 min    Equipment Utilized During Treatment Gait belt    Activity Tolerance Patient tolerated treatment well;No increased pain    Behavior During Therapy WFL for tasks assessed/performed                    Past Medical History:  Diagnosis Date   Ductal carcinoma in situ (DCIS) of left breast    Dysphagia    Esophageal stricture    Gastroparesis    GERD (gastroesophageal reflux disease)    Hiatal hernia    Hypertension    Iron deficiency anemia    Osteoporosis    Pulmonary embolism (HCC)    Scoliosis    UTI (urinary tract infection)    Past Surgical History:  Procedure Laterality Date   BREAST LUMPECTOMY Left 2009   Ductal Carcinoma insitu    CATARACT EXTRACTION, BILATERAL     COLONOSCOPY N/A 03/17/2021   Procedure: COLONOSCOPY;  Surgeon: Shane Darling, MD;  Location: [facility];  Service: Endoscopy;  Laterality: N/A;   COLOSTOMY REVISION Right 03/18/2021   Procedure: COLON RESECTION RIGHT;  Surgeon: Emmalene Hare, MD;  Location: [facility];  Service: General;  Laterality: Right;   LAPAROSCOPIC PARAESOPHAGEAL HERNIA REPAIR  2009   LAPAROTOMY N/A 03/10/2021   Procedure: EXPLORATORY LAPAROTOMY;  Surgeon: Emmalene Hare, MD;  Location: [facility];  Service: General;  Laterality: N/A;   TUBAL LIGATION     Patient Active Problem List   Diagnosis Date Noted   Acute on chronic diastolic CHF (congestive heart failure) (HCC) 08/19/2023   Acute respiratory failure with hypoxia (HCC)  08/17/2023   History of pulmonary embolus (PE) 08/17/2023   Basal cell carcinoma of leg 04/01/2021   Coronary atherosclerosis 04/01/2021   History of pulmonary embolism 04/01/2021   Pulmonary nodule 04/01/2021   Malignant neoplasm of colon (HCC) 03/29/2021   Rectal bleeding 03/15/2021   Hypertension    GERD (gastroesophageal reflux disease)    Colonic mass    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HCC) 03/10/2021   Anemia, unspecified 03/06/2021   Bronchiectasis without complication (HCC) 03/27/2020   Iron deficiency anemia 03/27/2020   Moderate aortic stenosis 03/27/2020   Stage 3a chronic kidney disease (HCC) 07/04/2019   Hypnic headache 05/28/2019   Telogen effluvium 04/17/2019   Xerosis cutis 04/17/2019   Essential hypertension 01/14/2019   Cervical radiculopathy 05/04/2018   Osteoarthritis of left glenohumeral joint 05/04/2018   Vitamin B12 deficiency 01/01/2018   Vascular abnormality 09/11/2017   Dyspepsia 03/28/2017   Pulmonary embolism (HCC) 08/16/2016   Dilated pore of [place] 03/23/2015   Retinal drusen of both eyes 08/28/2014   Status post right cataract extraction 07/30/2014   Verruca 03/19/2014   Chronic back pain 11/06/2013   Postmenopausal osteoporosis 06/11/2013   Anticoagulated on Coumadin  04/06/2013   Long term (current) use of anticoagulants 10/18/2012   Preglaucoma 05/10/2012   Presbyopia 05/10/2012  Senile nuclear sclerosis 05/10/2012   Lobular carcinoma in situ of breast 04/13/2012   Closed fracture of ankle 02/27/2012   History of nonmelanoma skin cancer 09/05/2011   Other benign neoplasm of skin of trunk 09/05/2011   Osteoporosis 08/25/2011   Transient alteration of awareness 09/03/2009   Colon adenoma 07/30/2002    ONSET DATE: 3 months   REFERRING DIAG:  Diagnosis  R53.83 (ICD-10-CM) - Other fatigue    THERAPY DIAG:  Difficulty in walking, not elsewhere classified  Muscle weakness (generalized)  Other abnormalities of gait and  mobility  Unsteadiness on feet  Other low back pain  Other lack of coordination  Rationale for Evaluation and Treatment: Rehabilitation  SUBJECTIVE:                                                                                                                                                                                             SUBJECTIVE STATEMENT:  Patient reports she has a UTI and feeling rough but didn't want to miss a visit.       Pt accompanied by: self  PERTINENT HISTORY:  From recent MD visit.  89 y.o. here for an acute issue. She has a PMH of chronic PE/Coumadin , anemia, colon cancer, breast cancer, large hiatal hernia, moderate aortic stenosis who has concerns about shortness of breath, nausea, generalized weakness. No chest pain. History of hiatal hernia and recent CT chest. Also recent endoscopy. Takes Zofran  chronically. Has felt weaker and almost fell recently. Uses a walker. Also on chronic UTI suppression, Keflex .   PAIN:  Are you having pain? Yes: NPRS scale: 5/10 Pain location: low back  Pain description: sore  Aggravating factors: movement  Relieving factors: rest   PRECAUTIONS: Fall  RED FLAGS: None   WEIGHT BEARING RESTRICTIONS: No  FALLS: Has patient fallen in last 6 months? No  LIVING ENVIRONMENT: Lives with: lives alone Lives in: House/apartment Stairs: Yes: External: 1 steps; none Has following equipment at home: Walker - 4 wheeled  PLOF: Independent with household mobility with device and Independent with community mobility with device  PATIENT GOALS: improved strength in BLE   OBJECTIVE:   DIAGNOSTIC FINDINGS:  06/12/2023 EXAM: CT CHEST, ABDOMEN, AND PELVIS WITH CONTRAST IMPRESSION: 1. Status post right hemicolectomy, without recurrent or metastatic disease. Decreased sensitivity exam, primarily involving the abdomen pelvis, due to paucity of fat. 2. New tiny left groin hernia containing fluid. Consider physical exam  correlation. 3. Incidental findings, including: Coronary artery atherosclerosis. Aortic Atherosclerosis (ICD10-I70.0). Dilated esophagus with contrast throughout, suggesting dysmotility and/or gastroesophageal reflux.  COGNITION: Overall cognitive status: Within functional limits for tasks assessed  SENSATION: Light touch: Impaired  mild paraesthesia in the proximal RLE   COORDINATION: WFL. Ankle to knee limited ROM due to weakness in the R hip      POSTURE: rounded shoulders, forward head, flexed trunk , and severe scoliosis     LOWER EXTREMITY MMT:    MMT Right Eval Left Eval  Hip flexion 4- 4  Hip extension    Hip abduction 4- 44  Hip adduction 4 4  Hip internal rotation    Hip external rotation    Knee flexion 4- 4  Knee extension 4 4+  Ankle dorsiflexion 4 4+  Ankle plantarflexion    Ankle inversion    Ankle eversion    (Blank rows = not tested)    TRANSFERS: Assistive device utilized: Environmental consultant - 4 wheeled  Sit to stand: SBA Stand to sit: SBA Chair to chair: SBA Floor: Unable to perform    STAIRS: Level of Assistance: pt declines Stair Negotiation Technique: Step to Pattern with Bilateral Rails Number of Stairs: 4  Height of Stairs: 6  Comments: will need to assess.   GAIT: Gait pattern: step through pattern, Right foot flat, and lateral hip instability Distance walked: 235ft Assistive device utilized: Walker - 4 wheeled Level of assistance: Modified independence and SBA Comments: severe scoliosis.    FUNCTIONAL TESTS:  5 times sit to stand: 30.86 Timed up and go (TUG): 29.39 2 minute walk test: 242ft  10 meter walk test: 0.631m/s, SOB 4-5.  Berg Balance Scale: TBD  PATIENT SURVEYS:  FOTO 56. goal 64  TODAY'S TREATMENT:                                                                                                                              DATE:  04/17/2024   Seated therex:  Seated hip march  BTB 2 x 12 reps Seated knee ext with  3 sec hold 2 x 12 reps  Gait in clinic with 4WW x 160 feet x 2 trials today.  Seated hip abd with BTB 2 x 12 reps Seated Knee flex with BTB 2 x 12 reps  Seated heel raises 2 x 12 reps      PATIENT EDUCATION: Education details: Pt educated throughout session about proper posture and technique with exercises. Improved exercise technique, movement at target joints, use of target muscles after min to mod verbal, visual, tactile cues.  Person educated: Patient Education method: Explanation Education comprehension: verbalized understanding and returned demonstration  HOME EXERCISE PROGRAM: Access Code: [email or web address] URL: [email or web address] Date: 09/25/2023 Prepared by: Aurora Lees  Exercises - Seated Hip Abduction with Resistance  - 1 x daily - 4 x weekly - 3 sets - 15 reps - 3 hold - Seated March with Resistance  - 1 x daily - 4 x weekly - 3 sets - 10 reps - 1 hold - Seated Long Arc Quad  - 1 x daily - 4 x weekly - 3 sets - 12 reps -  3 hold - Seated Hip Adduction Isometrics with Ball  - 1 x daily - 4 x weekly - 3 sets - 12 reps - 3 hold - Standing March with Counter Support  - 1 x daily - 3 x weekly - 2 sets - 10 reps - Standing Hip Abduction with Counter Support  - 1 x daily - 3 x weekly - 2 sets - 10 reps - Standing Hip Extension with Counter Support  - 1 x daily - 3 x weekly - 2 sets - 10 reps - Mini Squat with Counter Support  - 1 x daily - 3 x weekly - 2 sets - 10 reps - Standing Knee Flexion with Counter Support  - 1 x daily - 3 x weekly - 2 sets - 10 reps - Standing Tandem Balance with Counter Support  - 1 x daily - 3 x weekly - 2 sets - 4 reps - 10 hold  GOALS: Goals reviewed with patient? Yes   SHORT TERM GOALS: Target date: 12/28/23    Patient will be independent in home exercise program to improve strength/mobility for better functional independence with ADLs. Baseline:to be given at next session; 10/23/2023- Patient reports independence with HEP  provided and no significant issues.   Goal status: MET 2. Pt will be able to ambulate for at least 5 min without stopping with LRAD to allow improved access to community and return to PLOF  Baseline: 3 minutes. 2/11: 6 minute walk test completed.   Goal Status: MET   . LONG TERM GOALS: Target date: 04/23/2024   Patient will increase FOTO score to equal to or greater than  61   to demonstrate statistically significant improvement in mobility and quality of life.  Baseline: 56; 10/23/2023= 60 Goal status: PROGRESSING/DISCONTINUED   2.  Patient (> 98 years old) will complete five times sit to stand test in < 15 seconds  without UE support indicating an increased LE strength and improved balance. Baseline: 30.86 sec; 10/23/2023= 22.08 using 1 UE support.  12/11: 14.67 sec BUE from arm rest. And 14.12 with 1 UE pushing from arm rest. 01/30/24 13 seconds with UE  3/26: 15.0  able to achieve full stand on each repetition  Goal status: MET  3.  Patient will increase Berg Balance score to >45 points to demonstrate decreased fall risk during functional activities  Baseline: 26; 10/23/2023= 30 12/11: 34 01/30/24: 41/56 3/26: 40/56 Goal status: MET/ Adjusted   4.  Patient will increase 10 meter walk test to >1.53m/s as to improve gait speed for better community ambulation and to reduce fall risk. Baseline: 0.658m/s; 10/23/2023= 0.88 m/s (normal speed with 4WW) 12/11: Average Fast speed: 1.05 m/s Average Normal speed: 0.834 m/s 01/30/24: 1.14 m/s 3/26: 1.0 m/s Goal status: MET  5.  Patient will reduce timed up and go to <11 seconds to reduce fall risk and demonstrate improved transfer/gait ability. Baseline: 29.39 12/2: 16.4 sec with Rollator  12/11: 15.19 sec with rollator  2/4: 17.38 sec with rollator  3/4: 15 sec with rollator  3/26: 13.76 sec with rollator  Goal status: IN PROGRESS  6.  Patient will 2 min walk test by at least 34ft as to demonstrate reduced fall risk and improved dynamic  gait balance for better safety with community/home ambulation.   Baseline: 271ft; 10/23/2023= 360 feet  with 4WW 351ft with 4WW Goal status: MET  7. Pt will complete 6 min walk test without stopping and improve overall score by at least 140ft to  indicate increased access to community.   Baseline: to be complete 2/11:880 ft with RW; 01/30/24: 1168 ft with RW  3/26: 1137ft with Rollator  Goal: MET    ASSESSMENT: CLINICAL IMPRESSION:  Treatment limited secondary to patient not feeling well. Patient reported the sitting exercises were still hard. She responded well with brief rest breaks and able to complete all activities.   Kristina Rowe will continue to benefit from skilled physical therapy intervention to address stated impairments, improve QOL, decrease fall risk, and attain therapy goals.       OBJECTIVE IMPAIRMENTS: Abnormal gait, cardiopulmonary status limiting activity, decreased activity tolerance, decreased balance, decreased endurance, decreased mobility, difficulty walking, decreased ROM, decreased strength, decreased safety awareness, hypomobility, increased fascial restrictions, impaired flexibility, improper body mechanics, and postural dysfunction.   ACTIVITY LIMITATIONS: lifting, standing, squatting, transfers, and locomotion level  PARTICIPATION LIMITATIONS: laundry, shopping, and community activity  PERSONAL FACTORS: 3+ comorbidities: scoliosis, hx of CA, and PE are also affecting patient's functional outcome.   REHAB POTENTIAL: Good  CLINICAL DECISION MAKING: Stable/uncomplicated  EVALUATION COMPLEXITY: Moderate  PLAN:  PT FREQUENCY: 1-2x/week  PT DURATION: 12 weeks  PLANNED INTERVENTIONS: Therapeutic exercises, Therapeutic activity, Neuromuscular re-education, Balance training, Gait training, Patient/Family education, Self Care, Joint mobilization, Stair training, Vestibular training, DME instructions, Dry Needling, Spinal mobilization, Cryotherapy, Moist heat,  and Manual therapy  PLAN FOR NEXT SESSION: Continue Dynamic balance and gait training. Progressing to higher level variable direction stepping and reaching tasks  BLE and postural strengthening.   Ossie Blend, PT Physical Therapist - [facility]  Outpatient Physical Therapy- [facility] [phone]    9:25 AM 04/18/24

## 2024-04-18 ENCOUNTER — Ambulatory Visit: Payer: Medicare Other

## 2024-04-18 ENCOUNTER — Ambulatory Visit: Payer: Self-pay | Admitting: Urology

## 2024-04-18 LAB — CULTURE, URINE COMPREHENSIVE

## 2024-04-23 ENCOUNTER — Ambulatory Visit: Payer: Medicare Other

## 2024-04-23 DIAGNOSIS — R262 Difficulty in walking, not elsewhere classified: Secondary | ICD-10-CM

## 2024-04-23 DIAGNOSIS — M6281 Muscle weakness (generalized): Secondary | ICD-10-CM

## 2024-04-23 DIAGNOSIS — R278 Other lack of coordination: Secondary | ICD-10-CM

## 2024-04-23 DIAGNOSIS — R2681 Unsteadiness on feet: Secondary | ICD-10-CM

## 2024-04-23 DIAGNOSIS — M5459 Other low back pain: Secondary | ICD-10-CM

## 2024-04-23 DIAGNOSIS — R2689 Other abnormalities of gait and mobility: Secondary | ICD-10-CM

## 2024-04-23 NOTE — Therapy (Signed)
 OUTPATIENT PHYSICAL THERAPY NEURO TREATMENT NOTE/RECERT      Patient Name: Kristina Rowe MRN: [id number] DOB:02-17-1930, 89 y.o., female Today's Date: 04/23/2024   PCP: Rex Castor, MD  REFERRING PROVIDER:   Clarinda Crome, PA-C    END OF SESSION:   PT End of Session - 04/23/24 1414     Visit Number 39    Number of Visits 59    Date for PT Re-Evaluation 07/16/24    Progress Note Due on Visit 40    PT Start Time 1403    PT Stop Time 1449    PT Time Calculation (min) 46 min    Equipment Utilized During Treatment Gait belt    Activity Tolerance Patient tolerated treatment well;No increased pain    Behavior During Therapy WFL for tasks assessed/performed                     Past Medical History:  Diagnosis Date   Ductal carcinoma in situ (DCIS) of left breast    Dysphagia    Esophageal stricture    Gastroparesis    GERD (gastroesophageal reflux disease)    Hiatal hernia    Hypertension    Iron deficiency anemia    Osteoporosis    Pulmonary embolism (HCC)    Scoliosis    UTI (urinary tract infection)    Past Surgical History:  Procedure Laterality Date   BREAST LUMPECTOMY Left 2009   Ductal Carcinoma insitu    CATARACT EXTRACTION, BILATERAL     COLONOSCOPY N/A 03/17/2021   Procedure: COLONOSCOPY;  Surgeon: Shane Darling, MD;  Location: [facility];  Service: Endoscopy;  Laterality: N/A;   COLOSTOMY REVISION Right 03/18/2021   Procedure: COLON RESECTION RIGHT;  Surgeon: Emmalene Hare, MD;  Location: [facility];  Service: General;  Laterality: Right;   LAPAROSCOPIC PARAESOPHAGEAL HERNIA REPAIR  2009   LAPAROTOMY N/A 03/10/2021   Procedure: EXPLORATORY LAPAROTOMY;  Surgeon: Emmalene Hare, MD;  Location: [facility];  Service: General;  Laterality: N/A;   TUBAL LIGATION     Patient Active Problem List   Diagnosis Date Noted   Acute on chronic diastolic CHF (congestive heart failure) (HCC) 08/19/2023   Acute respiratory failure with hypoxia  (HCC) 08/17/2023   History of pulmonary embolus (PE) 08/17/2023   Basal cell carcinoma of leg 04/01/2021   Coronary atherosclerosis 04/01/2021   History of pulmonary embolism 04/01/2021   Pulmonary nodule 04/01/2021   Malignant neoplasm of colon (HCC) 03/29/2021   Rectal bleeding 03/15/2021   Hypertension    GERD (gastroesophageal reflux disease)    Colonic mass    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HCC) 03/10/2021   Anemia, unspecified 03/06/2021   Bronchiectasis without complication (HCC) 03/27/2020   Iron deficiency anemia 03/27/2020   Moderate aortic stenosis 03/27/2020   Stage 3a chronic kidney disease (HCC) 07/04/2019   Hypnic headache 05/28/2019   Telogen effluvium 04/17/2019   Xerosis cutis 04/17/2019   Essential hypertension 01/14/2019   Cervical radiculopathy 05/04/2018   Osteoarthritis of left glenohumeral joint 05/04/2018   Vitamin B12 deficiency 01/01/2018   Vascular abnormality 09/11/2017   Dyspepsia 03/28/2017   Pulmonary embolism (HCC) 08/16/2016   Dilated pore of [place] 03/23/2015   Retinal drusen of both eyes 08/28/2014   Status post right cataract extraction 07/30/2014   Verruca 03/19/2014   Chronic back pain 11/06/2013   Postmenopausal osteoporosis 06/11/2013   Anticoagulated on Coumadin  04/06/2013   Long term (current) use of anticoagulants 10/18/2012   Preglaucoma 05/10/2012   Presbyopia 05/10/2012  Senile nuclear sclerosis 05/10/2012   Lobular carcinoma in situ of breast 04/13/2012   Closed fracture of ankle 02/27/2012   History of nonmelanoma skin cancer 09/05/2011   Other benign neoplasm of skin of trunk 09/05/2011   Osteoporosis 08/25/2011   Transient alteration of awareness 09/03/2009   Colon adenoma 07/30/2002    ONSET DATE: 3 months   REFERRING DIAG:  Diagnosis  R53.83 (ICD-10-CM) - Other fatigue    THERAPY DIAG:  Difficulty in walking, not elsewhere classified  Muscle weakness (generalized)  Other abnormalities of gait and  mobility  Unsteadiness on feet  Other low back pain  Other lack of coordination  Rationale for Evaluation and Treatment: Rehabilitation  SUBJECTIVE:                                                                                                                                                                                             SUBJECTIVE STATEMENT:  Patient reports feeling better since last week and states no further issues. She states she feels she has made good progress and would like to continue to work on her balance, Posture, and LE strength.         Pt accompanied by: self  PERTINENT HISTORY:  From recent MD visit.  89 y.o. here for an acute issue. She has a PMH of chronic PE/Coumadin , anemia, colon cancer, breast cancer, large hiatal hernia, moderate aortic stenosis who has concerns about shortness of breath, nausea, generalized weakness. No chest pain. History of hiatal hernia and recent CT chest. Also recent endoscopy. Takes Zofran  chronically. Has felt weaker and almost fell recently. Uses a walker. Also on chronic UTI suppression, Keflex .   PAIN:  Are you having pain? Yes: NPRS scale: 5/10 Pain location: low back  Pain description: sore  Aggravating factors: movement  Relieving factors: rest   PRECAUTIONS: Fall  RED FLAGS: None   WEIGHT BEARING RESTRICTIONS: No  FALLS: Has patient fallen in last 6 months? No  LIVING ENVIRONMENT: Lives with: lives alone Lives in: House/apartment Stairs: Yes: External: 1 steps; none Has following equipment at home: Walker - 4 wheeled  PLOF: Independent with household mobility with device and Independent with community mobility with device  PATIENT GOALS: improved strength in BLE   OBJECTIVE:   DIAGNOSTIC FINDINGS:  06/12/2023 EXAM: CT CHEST, ABDOMEN, AND PELVIS WITH CONTRAST IMPRESSION: 1. Status post right hemicolectomy, without recurrent or metastatic disease. Decreased sensitivity exam, primarily involving  the abdomen pelvis, due to paucity of fat. 2. New tiny left groin hernia containing fluid. Consider physical exam correlation. 3. Incidental findings, including: Coronary artery atherosclerosis. Aortic Atherosclerosis (ICD10-I70.0). Dilated esophagus  with contrast throughout, suggesting dysmotility and/or gastroesophageal reflux.  COGNITION: Overall cognitive status: Within functional limits for tasks assessed   SENSATION: Light touch: Impaired  mild paraesthesia in the proximal RLE   COORDINATION: WFL. Ankle to knee limited ROM due to weakness in the R hip      POSTURE: rounded shoulders, forward head, flexed trunk , and severe scoliosis     LOWER EXTREMITY MMT:    MMT Right Eval Left Eval  Hip flexion 4- 4  Hip extension    Hip abduction 4- 44  Hip adduction 4 4  Hip internal rotation    Hip external rotation    Knee flexion 4- 4  Knee extension 4 4+  Ankle dorsiflexion 4 4+  Ankle plantarflexion    Ankle inversion    Ankle eversion    (Blank rows = not tested)    TRANSFERS: Assistive device utilized: Environmental consultant - 4 wheeled  Sit to stand: SBA Stand to sit: SBA Chair to chair: SBA Floor: Unable to perform    STAIRS: Level of Assistance: pt declines Stair Negotiation Technique: Step to Pattern with Bilateral Rails Number of Stairs: 4  Height of Stairs: 6  Comments: will need to assess.   GAIT: Gait pattern: step through pattern, Right foot flat, and lateral hip instability Distance walked: 254ft Assistive device utilized: Walker - 4 wheeled Level of assistance: Modified independence and SBA Comments: severe scoliosis.    FUNCTIONAL TESTS:  5 times sit to stand: 30.86 Timed up and go (TUG): 29.39 2 minute walk test: 270ft  10 meter walk test: 0.659m/s, SOB 4-5.  Berg Balance Scale: TBD  PATIENT SURVEYS:  FOTO 56. goal 64  TODAY'S TREATMENT:                                                                                                                               DATE:  04/23/2024  Physical therapy treatment session today consisted of completing assessment of goals and administration of testing as demonstrated and documented in flow sheet, treatment, and goals section of this note. Addition treatments may be found below.    [facility] PT Assessment - 04/23/24 1430       Berg Balance Test   Sit to Stand Able to stand  independently using hands    Standing Unsupported Able to stand safely 2 minutes    Sitting with Back Unsupported but Feet Supported on Floor or Stool Able to sit safely and securely 2 minutes    Stand to Sit Sits safely with minimal use of hands    Transfers Able to transfer safely, minor use of hands    Standing Unsupported with Eyes Closed Able to stand 10 seconds safely    Standing Unsupported with Feet Together Able to place feet together independently and stand for 1 minute with supervision    From Standing, Reach Forward with Outstretched Arm Can reach forward >12 cm safely (5")    From Standing  Position, Pick up Object from Floor Able to pick up shoe, needs supervision    From Standing Position, Turn to Look Behind Over each Shoulder Looks behind one side only/other side shows less weight shift    Turn 360 Degrees Able to turn 360 degrees safely one side only in 4 seconds or less    Standing Unsupported, Alternately Place Feet on Step/Stool Able to stand independently and complete 8 steps >20 seconds    Standing Unsupported, One Foot in Front Able to take small step independently and hold 30 seconds    Standing on One Leg Tries to lift leg/unable to hold 3 seconds but remains standing independently    Total Score 44              PT instructed pt in TUG: 12.34  sec (average of 3 trials; >13.5 sec indicates increased fall risk)      PATIENT EDUCATION: Education details: Pt educated throughout session about proper posture and technique with exercises. Improved exercise technique, movement at target joints,  use of target muscles after min to mod verbal, visual, tactile cues.  Person educated: Patient Education method: Explanation Education comprehension: verbalized understanding and returned demonstration  HOME EXERCISE PROGRAM: Access Code: [email or web address] URL: [email or web address] Date: 09/25/2023 Prepared by: Aurora Lees  Exercises - Seated Hip Abduction with Resistance  - 1 x daily - 4 x weekly - 3 sets - 15 reps - 3 hold - Seated March with Resistance  - 1 x daily - 4 x weekly - 3 sets - 10 reps - 1 hold - Seated Long Arc Quad  - 1 x daily - 4 x weekly - 3 sets - 12 reps - 3 hold - Seated Hip Adduction Isometrics with Ball  - 1 x daily - 4 x weekly - 3 sets - 12 reps - 3 hold - Standing March with Counter Support  - 1 x daily - 3 x weekly - 2 sets - 10 reps - Standing Hip Abduction with Counter Support  - 1 x daily - 3 x weekly - 2 sets - 10 reps - Standing Hip Extension with Counter Support  - 1 x daily - 3 x weekly - 2 sets - 10 reps - Mini Squat with Counter Support  - 1 x daily - 3 x weekly - 2 sets - 10 reps - Standing Knee Flexion with Counter Support  - 1 x daily - 3 x weekly - 2 sets - 10 reps - Standing Tandem Balance with Counter Support  - 1 x daily - 3 x weekly - 2 sets - 4 reps - 10 hold  GOALS: Goals reviewed with patient? Yes   SHORT TERM GOALS: Target date: 12/28/23    Patient will be independent in home exercise program to improve strength/mobility for better functional independence with ADLs. Baseline:to be given at next session; 10/23/2023- Patient reports independence with HEP provided and no significant issues.   Goal status: MET 2. Pt will be able to ambulate for at least 5 min without stopping with LRAD to allow improved access to community and return to PLOF  Baseline: 3 minutes. 2/11: 6 minute walk test completed.   Goal Status: MET   . LONG TERM GOALS: Target date: 07/16/2024   Patient will increase FOTO score to equal to or greater than   61   to demonstrate statistically significant improvement in mobility and quality of life.  Baseline: 56; 10/23/2023= 60 Goal status: PROGRESSING/DISCONTINUED   2.  Patient (>  9 years old) will complete five times sit to stand test in < 15 seconds  without UE support indicating an increased LE strength and improved balance. Baseline: 30.86 sec; 10/23/2023= 22.08 using 1 UE support.  12/11: 14.67 sec BUE from arm rest. And 14.12 with 1 UE pushing from arm rest. 01/30/24 13 seconds with UE  3/26: 15.0  able to achieve full stand on each repetition  Goal status: MET  3.  Patient will increase Berg Balance score to >45 points to demonstrate decreased fall risk during functional activities  Baseline: 26; 10/23/2023= 30 12/11: 34 01/30/24: 41/56 3/26: 40/56 04/23/2024= 44/56 Goal status: Progressing  4.  Patient will increase 10 meter walk test to >1.18m/s as to improve gait speed for better community ambulation and to reduce fall risk. Baseline: 0.656m/s; 10/23/2023= 0.88 m/s (normal speed with 4WW) 12/11: Average Fast speed: 1.05 m/s Average Normal speed: 0.834 m/s 01/30/24: 1.14 m/s 3/26: 1.0 m/s Goal status: MET  5.  Patient will reduce timed up and go to <11 seconds to reduce fall risk and demonstrate improved transfer/gait ability. Baseline: 29.39 12/2: 16.4 sec with Rollator  12/11: 15.19 sec with rollator  2/4: 17.38 sec with rollator  3/4: 15 sec with rollator  3/26: 13.76 sec with rollator  04/23/2024= 12.34 sec avg with SPC Goal status: IN PROGRESS  6.  Patient will 2 min walk test by at least 93ft as to demonstrate reduced fall risk and improved dynamic gait balance for better safety with community/home ambulation.   Baseline: 29ft; 10/23/2023= 360 feet  with 4WW 347ft with 4WW Goal status: MET  7. Pt will complete 6 min walk test without stopping and improve overall score by at least 176ft to indicate increased access to community.   Baseline: to be complete 2/11:880 ft  with RW; 01/30/24: 1168 ft with RW  3/26: 1154ft with Rollator  Goal: MET    ASSESSMENT: CLINICAL IMPRESSION:  Patient reports in good health today and agreeable to testing goals for recert visit. Overall she has progressed well and met many goals since beginning PT services including 6 min walk, 5 x STS, and 10 meter walk test. She presents with excessive Kyphosis and scoliosis and despite improved BERG score- continues to be considered at increased risk of falling. She most recently progressed to walking some with cane and her BERG score places her at using either walker or cane for safety. Patient's condition has the potential to improve in response to therapy. Maximum improvement is yet to be obtained. The anticipated improvement is attainable and reasonable in a generally predictable time.  Ms. Braud will continue to benefit from skilled physical therapy intervention to address stated impairments, improve QOL, decrease fall risk, and attain therapy goals.       OBJECTIVE IMPAIRMENTS: Abnormal gait, cardiopulmonary status limiting activity, decreased activity tolerance, decreased balance, decreased endurance, decreased mobility, difficulty walking, decreased ROM, decreased strength, decreased safety awareness, hypomobility, increased fascial restrictions, impaired flexibility, improper body mechanics, and postural dysfunction.   ACTIVITY LIMITATIONS: lifting, standing, squatting, transfers, and locomotion level  PARTICIPATION LIMITATIONS: laundry, shopping, and community activity  PERSONAL FACTORS: 3+ comorbidities: scoliosis, hx of CA, and PE are also affecting patient's functional outcome.   REHAB POTENTIAL: Good  CLINICAL DECISION MAKING: Stable/uncomplicated  EVALUATION COMPLEXITY: Moderate  PLAN:  PT FREQUENCY: 1-2x/week  PT DURATION: 12 weeks  PLANNED INTERVENTIONS: Therapeutic exercises, Therapeutic activity, Neuromuscular re-education, Balance training, Gait training,  Patient/Family education, Self Care, Joint mobilization, Stair training, Vestibular training, DME instructions,  Dry Needling, Spinal mobilization, Cryotherapy, Moist heat, and Manual therapy  PLAN FOR NEXT SESSION: Continue Dynamic balance and gait training. Progressing to higher level variable direction stepping and reaching tasks  BLE and postural strengthening.   Ossie Blend, PT Physical Therapist - [facility]  Outpatient Physical Therapy- [facility] [phone]    4:54 PM 04/23/24

## 2024-04-25 ENCOUNTER — Ambulatory Visit: Payer: Medicare Other

## 2024-04-30 ENCOUNTER — Ambulatory Visit: Payer: Medicare Other | Admitting: Physical Therapy

## 2024-05-01 ENCOUNTER — Inpatient Hospital Stay: Attending: Oncology

## 2024-05-01 DIAGNOSIS — E538 Deficiency of other specified B group vitamins: Secondary | ICD-10-CM | POA: Insufficient documentation

## 2024-05-01 DIAGNOSIS — D508 Other iron deficiency anemias: Secondary | ICD-10-CM

## 2024-05-01 MED ORDER — CYANOCOBALAMIN 1000 MCG/ML IJ SOLN
1000.0000 ug | INTRAMUSCULAR | Status: DC
  Administered 2024-05-01: 1000 ug via INTRAMUSCULAR
  Filled 2024-05-01: qty 1

## 2024-05-02 ENCOUNTER — Ambulatory Visit: Payer: Medicare Other

## 2024-05-03 ENCOUNTER — Ambulatory Visit: Attending: Physician Assistant

## 2024-05-03 DIAGNOSIS — R278 Other lack of coordination: Secondary | ICD-10-CM | POA: Insufficient documentation

## 2024-05-03 DIAGNOSIS — R262 Difficulty in walking, not elsewhere classified: Secondary | ICD-10-CM | POA: Insufficient documentation

## 2024-05-03 DIAGNOSIS — M6281 Muscle weakness (generalized): Secondary | ICD-10-CM | POA: Insufficient documentation

## 2024-05-03 DIAGNOSIS — M5459 Other low back pain: Secondary | ICD-10-CM | POA: Insufficient documentation

## 2024-05-03 DIAGNOSIS — R2689 Other abnormalities of gait and mobility: Secondary | ICD-10-CM | POA: Diagnosis present

## 2024-05-03 DIAGNOSIS — R2681 Unsteadiness on feet: Secondary | ICD-10-CM | POA: Diagnosis present

## 2024-05-03 NOTE — Therapy (Signed)
 OUTPATIENT PHYSICAL THERAPY NEURO TREATMENT NOTE/Physical Therapy Progress Note   Dates of reporting period  02/21/2024   to   05/03/2024       Patient Name: Kristina Rowe MRN: [id number] DOB:1930-02-14, 89 y.o., female Today's Date: 05/03/2024   PCP: Rex Castor, MD  REFERRING PROVIDER:   Clarinda Crome, PA-C    END OF SESSION:   PT End of Session - 05/03/24 1009     Visit Number 40    Number of Visits 59    Date for PT Re-Evaluation 07/16/24    Progress Note Due on Visit 40    PT Start Time 1010    PT Stop Time 1059    PT Time Calculation (min) 49 min    Equipment Utilized During Treatment Gait belt    Activity Tolerance Patient tolerated treatment well;No increased pain    Behavior During Therapy WFL for tasks assessed/performed                      Past Medical History:  Diagnosis Date   Ductal carcinoma in situ (DCIS) of left breast    Dysphagia    Esophageal stricture    Gastroparesis    GERD (gastroesophageal reflux disease)    Hiatal hernia    Hypertension    Iron deficiency anemia    Osteoporosis    Pulmonary embolism (HCC)    Scoliosis    UTI (urinary tract infection)    Past Surgical History:  Procedure Laterality Date   BREAST LUMPECTOMY Left 2009   Ductal Carcinoma insitu    CATARACT EXTRACTION, BILATERAL     COLONOSCOPY N/A 03/17/2021   Procedure: COLONOSCOPY;  Surgeon: Shane Darling, MD;  Location: [facility];  Service: Endoscopy;  Laterality: N/A;   COLOSTOMY REVISION Right 03/18/2021   Procedure: COLON RESECTION RIGHT;  Surgeon: Emmalene Hare, MD;  Location: [facility];  Service: General;  Laterality: Right;   LAPAROSCOPIC PARAESOPHAGEAL HERNIA REPAIR  2009   LAPAROTOMY N/A 03/10/2021   Procedure: EXPLORATORY LAPAROTOMY;  Surgeon: Emmalene Hare, MD;  Location: [facility];  Service: General;  Laterality: N/A;   TUBAL LIGATION     Patient Active Problem List   Diagnosis Date Noted   Acute on chronic diastolic CHF  (congestive heart failure) (HCC) 08/19/2023   Acute respiratory failure with hypoxia (HCC) 08/17/2023   History of pulmonary embolus (PE) 08/17/2023   Basal cell carcinoma of leg 04/01/2021   Coronary atherosclerosis 04/01/2021   History of pulmonary embolism 04/01/2021   Pulmonary nodule 04/01/2021   Malignant neoplasm of colon (HCC) 03/29/2021   Rectal bleeding 03/15/2021   Hypertension    GERD (gastroesophageal reflux disease)    Colonic mass    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HCC) 03/10/2021   Anemia, unspecified 03/06/2021   Bronchiectasis without complication (HCC) 03/27/2020   Iron deficiency anemia 03/27/2020   Moderate aortic stenosis 03/27/2020   Stage 3a chronic kidney disease (HCC) 07/04/2019   Hypnic headache 05/28/2019   Telogen effluvium 04/17/2019   Xerosis cutis 04/17/2019   Essential hypertension 01/14/2019   Cervical radiculopathy 05/04/2018   Osteoarthritis of left glenohumeral joint 05/04/2018   Vitamin B12 deficiency 01/01/2018   Vascular abnormality 09/11/2017   Dyspepsia 03/28/2017   Pulmonary embolism (HCC) 08/16/2016   Dilated pore of [place] 03/23/2015   Retinal drusen of both eyes 08/28/2014   Status post right cataract extraction 07/30/2014   Verruca 03/19/2014   Chronic back pain 11/06/2013   Postmenopausal osteoporosis 06/11/2013   Anticoagulated on  Coumadin  04/06/2013   Long term (current) use of anticoagulants 10/18/2012   Preglaucoma 05/10/2012   Presbyopia 05/10/2012   Senile nuclear sclerosis 05/10/2012   Lobular carcinoma in situ of breast 04/13/2012   Closed fracture of ankle 02/27/2012   History of nonmelanoma skin cancer 09/05/2011   Other benign neoplasm of skin of trunk 09/05/2011   Osteoporosis 08/25/2011   Transient alteration of awareness 09/03/2009   Colon adenoma 07/30/2002    ONSET DATE: 3 months   REFERRING DIAG:  Diagnosis  R53.83 (ICD-10-CM) - Other fatigue    THERAPY DIAG:  Difficulty in walking, not elsewhere  classified  Muscle weakness (generalized)  Other abnormalities of gait and mobility  Unsteadiness on feet  Other low back pain  Other lack of coordination  Rationale for Evaluation and Treatment: Rehabilitation  SUBJECTIVE:                                                                                                                                                                                             SUBJECTIVE STATEMENT:  Patient reports doing okay- states "It's early."          Pt accompanied by: self  PERTINENT HISTORY:  From recent MD visit.  89 y.o. here for an acute issue. She has a PMH of chronic PE/Coumadin , anemia, colon cancer, breast cancer, large hiatal hernia, moderate aortic stenosis who has concerns about shortness of breath, nausea, generalized weakness. No chest pain. History of hiatal hernia and recent CT chest. Also recent endoscopy. Takes Zofran  chronically. Has felt weaker and almost fell recently. Uses a walker. Also on chronic UTI suppression, Keflex .   PAIN:  Are you having pain? Yes: NPRS scale: 5/10 Pain location: low back  Pain description: sore  Aggravating factors: movement  Relieving factors: rest   PRECAUTIONS: Fall  RED FLAGS: None   WEIGHT BEARING RESTRICTIONS: No  FALLS: Has patient fallen in last 6 months? No  LIVING ENVIRONMENT: Lives with: lives alone Lives in: House/apartment Stairs: Yes: External: 1 steps; none Has following equipment at home: Walker - 4 wheeled  PLOF: Independent with household mobility with device and Independent with community mobility with device  PATIENT GOALS: improved strength in BLE   OBJECTIVE:   DIAGNOSTIC FINDINGS:  06/12/2023 EXAM: CT CHEST, ABDOMEN, AND PELVIS WITH CONTRAST IMPRESSION: 1. Status post right hemicolectomy, without recurrent or metastatic disease. Decreased sensitivity exam, primarily involving the abdomen pelvis, due to paucity of fat. 2. New tiny left groin  hernia containing fluid. Consider physical exam correlation. 3. Incidental findings, including: Coronary artery atherosclerosis. Aortic Atherosclerosis (ICD10-I70.0). Dilated esophagus with contrast throughout, suggesting dysmotility and/or  gastroesophageal reflux.  COGNITION: Overall cognitive status: Within functional limits for tasks assessed   SENSATION: Light touch: Impaired  mild paraesthesia in the proximal RLE   COORDINATION: WFL. Ankle to knee limited ROM due to weakness in the R hip      POSTURE: rounded shoulders, forward head, flexed trunk , and severe scoliosis     LOWER EXTREMITY MMT:    MMT Right Eval Left Eval  Hip flexion 4- 4  Hip extension    Hip abduction 4- 44  Hip adduction 4 4  Hip internal rotation    Hip external rotation    Knee flexion 4- 4  Knee extension 4 4+  Ankle dorsiflexion 4 4+  Ankle plantarflexion    Ankle inversion    Ankle eversion    (Blank rows = not tested)    TRANSFERS: Assistive device utilized: Environmental consultant - 4 wheeled  Sit to stand: SBA Stand to sit: SBA Chair to chair: SBA Floor: Unable to perform    STAIRS: Level of Assistance: pt declines Stair Negotiation Technique: Step to Pattern with Bilateral Rails Number of Stairs: 4  Height of Stairs: 6  Comments: will need to assess.   GAIT: Gait pattern: step through pattern, Right foot flat, and lateral hip instability Distance walked: 285ft Assistive device utilized: Walker - 4 wheeled Level of assistance: Modified independence and SBA Comments: severe scoliosis.    FUNCTIONAL TESTS:  5 times sit to stand: 30.86 Timed up and go (TUG): 29.39 2 minute walk test: 261ft  10 meter walk test: 0.672m/s, SOB 4-5.  Berg Balance Scale: TBD  PATIENT SURVEYS:  FOTO 56. goal 64  TODAY'S TREATMENT:                                                                                                                              DATE:  05/03/24    TE:  Minisquats without  UE support x 10   NMR:  Standing in staggered postioin wit scap retraction(arms in) 2 x 10 with RTB Standing shoulder ext in staggered position with 2x10 with RTB Standing Shoulder horizontal ABD with RTB- 2 x 12 (VC for correct technique)  Fwd walking in // bars- over obstacles (4 canes) with Min UE Support down and back x8 Side stepping in // bars - over obstacles (4 canes) without UE support - down and back x 5 Step ups x 12 reps without UE support (mild unsteadiness yet improved with practice)  Step tap alt LE without UE Support x 20 reps each side (VC for erect posture)  Tandem standing (near tandem) x 30 sec x 4 each side. Gait with hurrycane - 175 feet today with good reciprocal steps- VC to look ahead yet no LOB or unsteadiness.      PATIENT EDUCATION: Education details: Pt educated throughout session about proper posture and technique with exercises. Improved exercise technique, movement at target joints, use of target muscles after min to mod verbal,  visual, tactile cues.  Person educated: Patient Education method: Explanation Education comprehension: verbalized understanding and returned demonstration  HOME EXERCISE PROGRAM: Access Code: [email or web address] URL: [email or web address] Date: 09/25/2023 Prepared by: Aurora Lees  Exercises - Seated Hip Abduction with Resistance  - 1 x daily - 4 x weekly - 3 sets - 15 reps - 3 hold - Seated March with Resistance  - 1 x daily - 4 x weekly - 3 sets - 10 reps - 1 hold - Seated Long Arc Quad  - 1 x daily - 4 x weekly - 3 sets - 12 reps - 3 hold - Seated Hip Adduction Isometrics with Ball  - 1 x daily - 4 x weekly - 3 sets - 12 reps - 3 hold - Standing March with Counter Support  - 1 x daily - 3 x weekly - 2 sets - 10 reps - Standing Hip Abduction with Counter Support  - 1 x daily - 3 x weekly - 2 sets - 10 reps - Standing Hip Extension with Counter Support  - 1 x daily - 3 x weekly - 2 sets - 10 reps - Mini Squat with  Counter Support  - 1 x daily - 3 x weekly - 2 sets - 10 reps - Standing Knee Flexion with Counter Support  - 1 x daily - 3 x weekly - 2 sets - 10 reps - Standing Tandem Balance with Counter Support  - 1 x daily - 3 x weekly - 2 sets - 4 reps - 10 hold  GOALS: Goals reviewed with patient? Yes   SHORT TERM GOALS: Target date: 12/28/23    Patient will be independent in home exercise program to improve strength/mobility for better functional independence with ADLs. Baseline:to be given at next session; 10/23/2023- Patient reports independence with HEP provided and no significant issues.   Goal status: MET 2. Pt will be able to ambulate for at least 5 min without stopping with LRAD to allow improved access to community and return to PLOF  Baseline: 3 minutes. 2/11: 6 minute walk test completed.   Goal Status: MET   . LONG TERM GOALS: Target date: 07/16/2024   Patient will increase FOTO score to equal to or greater than  61   to demonstrate statistically significant improvement in mobility and quality of life.  Baseline: 56; 10/23/2023= 60 Goal status: PROGRESSING/DISCONTINUED   2.  Patient (> 27 years old) will complete five times sit to stand test in < 15 seconds  without UE support indicating an increased LE strength and improved balance. Baseline: 30.86 sec; 10/23/2023= 22.08 using 1 UE support.  12/11: 14.67 sec BUE from arm rest. And 14.12 with 1 UE pushing from arm rest. 01/30/24 13 seconds with UE  3/26: 15.0  able to achieve full stand on each repetition  Goal status: MET  3.  Patient will increase Berg Balance score to >45 points to demonstrate decreased fall risk during functional activities  Baseline: 26; 10/23/2023= 30 12/11: 34 01/30/24: 41/56 3/26: 40/56 04/23/2024= 44/56 Goal status: Progressing  4.  Patient will increase 10 meter walk test to >1.43m/s as to improve gait speed for better community ambulation and to reduce fall risk. Baseline: 0.640m/s; 10/23/2023= 0.88 m/s  (normal speed with 4WW) 12/11: Average Fast speed: 1.05 m/s Average Normal speed: 0.834 m/s 01/30/24: 1.14 m/s 3/26: 1.0 m/s Goal status: MET  5.  Patient will reduce timed up and go to <11 seconds to reduce fall risk and demonstrate improved transfer/gait ability.  Baseline: 29.39 12/2: 16.4 sec with Rollator  12/11: 15.19 sec with rollator  2/4: 17.38 sec with rollator  3/4: 15 sec with rollator  3/26: 13.76 sec with rollator  04/23/2024= 12.34 sec avg with SPC Goal status: IN PROGRESS  6.  Patient will 2 min walk test by at least 58ft as to demonstrate reduced fall risk and improved dynamic gait balance for better safety with community/home ambulation.   Baseline: 266ft; 10/23/2023= 360 feet  with 4WW 312ft with 4WW Goal status: MET  7. Pt will complete 6 min walk test without stopping and improve overall score by at least 148ft to indicate increased access to community.   Baseline: to be complete 2/11:880 ft with RW; 01/30/24: 1168 ft with RW  3/26: 1158ft with Rollator  Goal: MET    ASSESSMENT: CLINICAL IMPRESSION:   Patient presents with good motivation for today's progress visit. Goals were just assessed last visit for recert so did retest today. Patient presents with improving confidence in her balance as seen by ability to not use her UE support with step ups and step taps and later with near tandem standing. Patient's condition has the potential to improve in response to therapy. Maximum improvement is yet to be obtained. The anticipated improvement is attainable and reasonable in a generally predictable time.  Ms. Sacra will continue to benefit from skilled physical therapy intervention to address stated impairments, improve QOL, decrease fall risk, and attain therapy goals.       OBJECTIVE IMPAIRMENTS: Abnormal gait, cardiopulmonary status limiting activity, decreased activity tolerance, decreased balance, decreased endurance, decreased mobility, difficulty walking,  decreased ROM, decreased strength, decreased safety awareness, hypomobility, increased fascial restrictions, impaired flexibility, improper body mechanics, and postural dysfunction.   ACTIVITY LIMITATIONS: lifting, standing, squatting, transfers, and locomotion level  PARTICIPATION LIMITATIONS: laundry, shopping, and community activity  PERSONAL FACTORS: 3+ comorbidities: scoliosis, hx of CA, and PE are also affecting patient's functional outcome.   REHAB POTENTIAL: Good  CLINICAL DECISION MAKING: Stable/uncomplicated  EVALUATION COMPLEXITY: Moderate  PLAN:  PT FREQUENCY: 1-2x/week  PT DURATION: 12 weeks  PLANNED INTERVENTIONS: Therapeutic exercises, Therapeutic activity, Neuromuscular re-education, Balance training, Gait training, Patient/Family education, Self Care, Joint mobilization, Stair training, Vestibular training, DME instructions, Dry Needling, Spinal mobilization, Cryotherapy, Moist heat, and Manual therapy  PLAN FOR NEXT SESSION: Continue Dynamic balance and gait training. Progressing to higher level variable direction stepping and reaching tasks  BLE and postural strengthening.   Ossie Blend, PT Physical Therapist - [facility]  Outpatient Physical Therapy- [facility] [phone]    11:07 AM 05/03/24

## 2024-05-07 ENCOUNTER — Ambulatory Visit: Payer: Medicare Other

## 2024-05-08 ENCOUNTER — Ambulatory Visit

## 2024-05-08 ENCOUNTER — Inpatient Hospital Stay: Payer: Medicare Other

## 2024-05-08 DIAGNOSIS — R2681 Unsteadiness on feet: Secondary | ICD-10-CM

## 2024-05-08 DIAGNOSIS — R262 Difficulty in walking, not elsewhere classified: Secondary | ICD-10-CM | POA: Diagnosis not present

## 2024-05-08 DIAGNOSIS — M6281 Muscle weakness (generalized): Secondary | ICD-10-CM

## 2024-05-08 NOTE — Therapy (Signed)
 OUTPATIENT PHYSICAL THERAPY NEURO TREATMENT NOTE     Patient Name: Kristina Rowe MRN: [id number] DOB:[birth year], 89 y.o., female Today's Date: 05/08/2024   PCP: Rex Castor, MD  REFERRING PROVIDER:   Clarinda Crome, PA-C    END OF SESSION:   PT End of Session - 05/08/24 1448     Visit Number 41    Number of Visits 59    Date for PT Re-Evaluation 07/16/24    Progress Note Due on Visit 40    PT Start Time 1450    PT Stop Time 1529    PT Time Calculation (min) 39 min    Equipment Utilized During Treatment Gait belt    Activity Tolerance Patient tolerated treatment well;No increased pain    Behavior During Therapy WFL for tasks assessed/performed                      Past Medical History:  Diagnosis Date   Ductal carcinoma in situ (DCIS) of left breast    Dysphagia    Esophageal stricture    Gastroparesis    GERD (gastroesophageal reflux disease)    Hiatal hernia    Hypertension    Iron deficiency anemia    Osteoporosis    Pulmonary embolism (HCC)    Scoliosis    UTI (urinary tract infection)    Past Surgical History:  Procedure Laterality Date   BREAST LUMPECTOMY Left 2009   Ductal Carcinoma insitu    CATARACT EXTRACTION, BILATERAL     COLONOSCOPY N/A 03/17/2021   Procedure: COLONOSCOPY;  Surgeon: Shane Darling, MD;  Location: [facility];  Service: Endoscopy;  Laterality: N/A;   COLOSTOMY REVISION Right 03/18/2021   Procedure: COLON RESECTION RIGHT;  Surgeon: Emmalene Hare, MD;  Location: [facility];  Service: General;  Laterality: Right;   LAPAROSCOPIC PARAESOPHAGEAL HERNIA REPAIR  2009   LAPAROTOMY N/A 03/10/2021   Procedure: EXPLORATORY LAPAROTOMY;  Surgeon: Emmalene Hare, MD;  Location: [facility];  Service: General;  Laterality: N/A;   TUBAL LIGATION     Patient Active Problem List   Diagnosis Date Noted   Acute on chronic diastolic CHF (congestive heart failure) (HCC) 08/19/2023   Acute respiratory failure with hypoxia (HCC)  08/17/2023   History of pulmonary embolus (PE) 08/17/2023   Basal cell carcinoma of leg 04/01/2021   Coronary atherosclerosis 04/01/2021   History of pulmonary embolism 04/01/2021   Pulmonary nodule 04/01/2021   Malignant neoplasm of colon (HCC) 03/29/2021   Rectal bleeding 03/15/2021   Hypertension    GERD (gastroesophageal reflux disease)    Colonic mass    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HCC) 03/10/2021   Anemia, unspecified 03/06/2021   Bronchiectasis without complication (HCC) 03/27/2020   Iron deficiency anemia 03/27/2020   Moderate aortic stenosis 03/27/2020   Stage 3a chronic kidney disease (HCC) 07/04/2019   Hypnic headache 05/28/2019   Telogen effluvium 04/17/2019   Xerosis cutis 04/17/2019   Essential hypertension 01/14/2019   Cervical radiculopathy 05/04/2018   Osteoarthritis of left glenohumeral joint 05/04/2018   Vitamin B12 deficiency 01/01/2018   Vascular abnormality 09/11/2017   Dyspepsia 03/28/2017   Pulmonary embolism (HCC) 08/16/2016   Dilated pore of [place] 03/23/2015   Retinal drusen of both eyes 08/28/2014   Status post right cataract extraction 07/30/2014   Verruca 03/19/2014   Chronic back pain 11/06/2013   Postmenopausal osteoporosis 06/11/2013   Anticoagulated on Coumadin  04/06/2013   Long term (current) use of anticoagulants 10/18/2012   Preglaucoma 05/10/2012   Presbyopia 05/10/2012  Senile nuclear sclerosis 05/10/2012   Lobular carcinoma in situ of breast 04/13/2012   Closed fracture of ankle 02/27/2012   History of nonmelanoma skin cancer 09/05/2011   Other benign neoplasm of skin of trunk 09/05/2011   Osteoporosis 08/25/2011   Transient alteration of awareness 09/03/2009   Colon adenoma 07/30/2002    ONSET DATE: 3 months   REFERRING DIAG:  Diagnosis  R53.83 (ICD-10-CM) - Other fatigue    THERAPY DIAG:  Muscle weakness (generalized)  Difficulty in walking, not elsewhere classified  Unsteadiness on feet  Rationale for Evaluation and  Treatment: Rehabilitation  SUBJECTIVE:                                                                                                                                                                                             SUBJECTIVE STATEMENT:  Patient doing OK. She reports she tries to use her cane at home, but does find herself using walker since she is more comfortable with it. Pt reports no new aches/pains. Pt reports no stumbles/falls.   Pt accompanied by: self  PERTINENT HISTORY:  From recent MD visit.  89 y.o. here for an acute issue. She has a PMH of chronic PE/Coumadin , anemia, colon cancer, breast cancer, large hiatal hernia, moderate aortic stenosis who has concerns about shortness of breath, nausea, generalized weakness. No chest pain. History of hiatal hernia and recent CT chest. Also recent endoscopy. Takes Zofran  chronically. Has felt weaker and almost fell recently. Uses a walker. Also on chronic UTI suppression, Keflex .   PAIN:  Are you having pain? Yes: NPRS scale: 5/10 Pain location: low back  Pain description: sore  Aggravating factors: movement  Relieving factors: rest   PRECAUTIONS: Fall  RED FLAGS: None   WEIGHT BEARING RESTRICTIONS: No  FALLS: Has patient fallen in last 6 months? No  LIVING ENVIRONMENT: Lives with: lives alone Lives in: House/apartment Stairs: Yes: External: 1 steps; none Has following equipment at home: Walker - 4 wheeled  PLOF: Independent with household mobility with device and Independent with community mobility with device  PATIENT GOALS: improved strength in BLE   OBJECTIVE:   DIAGNOSTIC FINDINGS:  06/12/2023 EXAM: CT CHEST, ABDOMEN, AND PELVIS WITH CONTRAST IMPRESSION: 1. Status post right hemicolectomy, without recurrent or metastatic disease. Decreased sensitivity exam, primarily involving the abdomen pelvis, due to paucity of fat. 2. New tiny left groin hernia containing fluid. Consider physical exam  correlation. 3. Incidental findings, including: Coronary artery atherosclerosis. Aortic Atherosclerosis (ICD10-I70.0). Dilated esophagus with contrast throughout, suggesting dysmotility and/or gastroesophageal reflux.  COGNITION: Overall cognitive status: Within functional limits for tasks assessed   SENSATION: Light  touch: Impaired  mild paraesthesia in the proximal RLE   COORDINATION: WFL. Ankle to knee limited ROM due to weakness in the R hip      POSTURE: rounded shoulders, forward head, flexed trunk , and severe scoliosis     LOWER EXTREMITY MMT:    MMT Right Eval Left Eval  Hip flexion 4- 4  Hip extension    Hip abduction 4- 44  Hip adduction 4 4  Hip internal rotation    Hip external rotation    Knee flexion 4- 4  Knee extension 4 4+  Ankle dorsiflexion 4 4+  Ankle plantarflexion    Ankle inversion    Ankle eversion    (Blank rows = not tested)    TRANSFERS: Assistive device utilized: Environmental consultant - 4 wheeled  Sit to stand: SBA Stand to sit: SBA Chair to chair: SBA Floor: Unable to perform    STAIRS: Level of Assistance: pt declines Stair Negotiation Technique: Step to Pattern with Bilateral Rails Number of Stairs: 4  Height of Stairs: 6  Comments: will need to assess.   GAIT: Gait pattern: step through pattern, Right foot flat, and lateral hip instability Distance walked: 257ft Assistive device utilized: Walker - 4 wheeled Level of assistance: Modified independence and SBA Comments: severe scoliosis.    FUNCTIONAL TESTS:  5 times sit to stand: 30.86 Timed up and go (TUG): 29.39 2 minute walk test: 250ft  10 meter walk test: 0.666m/s, SOB 4-5.  Berg Balance Scale: TBD  PATIENT SURVEYS:  FOTO 56. goal 64  TODAY'S TREATMENT:                                                                                                                              DATE:  05/08/24    TA:  Minisquats without UE support x 10, x8 - rates medium  NMR:   Standing in staggered postioin wit scap retraction(arms in) 2 x 10 with RTB  Standing shoulder ext in with 2x12 with RTB  Standing Shoulder horizontal ABD with RTB- 2 x 12  Fwd walking near support bar - over obstacles (2 canes and one half-bolster) with Min UE to intermittent Support down and back x12  Side stepping in // bars - over obstacles (4 canes) with  intermittent UE support - down and back x 8 - pt rates as a little bit of a challenge   Step ups x 12 reps with intermittent UE support - alt leading LE   Step tap alt LE without UE Support x 20 reps each side (VC for erect posture)   Tandem standing 2x30 sec each LE and intermittent UE support  Gait with SPC and CGA - 396 feet today with reciprocal steps, but short step-length no LOB     PATIENT EDUCATION: Education details: Pt educated throughout session about proper posture and technique with exercises. Improved exercise technique, movement at target joints, use of target muscles after min to mod verbal,  visual, tactile cues.  Person educated: Patient Education method: Explanation, demo, VC Education comprehension: verbalized understanding and returned demonstration  HOME EXERCISE PROGRAM: Access Code: [email or web address] URL: [email or web address] Date: 09/25/2023 Prepared by: Aurora Lees  Exercises - Seated Hip Abduction with Resistance  - 1 x daily - 4 x weekly - 3 sets - 15 reps - 3 hold - Seated March with Resistance  - 1 x daily - 4 x weekly - 3 sets - 10 reps - 1 hold - Seated Long Arc Quad  - 1 x daily - 4 x weekly - 3 sets - 12 reps - 3 hold - Seated Hip Adduction Isometrics with Ball  - 1 x daily - 4 x weekly - 3 sets - 12 reps - 3 hold - Standing March with Counter Support  - 1 x daily - 3 x weekly - 2 sets - 10 reps - Standing Hip Abduction with Counter Support  - 1 x daily - 3 x weekly - 2 sets - 10 reps - Standing Hip Extension with Counter Support  - 1 x daily - 3 x weekly - 2 sets - 10 reps -  Mini Squat with Counter Support  - 1 x daily - 3 x weekly - 2 sets - 10 reps - Standing Knee Flexion with Counter Support  - 1 x daily - 3 x weekly - 2 sets - 10 reps - Standing Tandem Balance with Counter Support  - 1 x daily - 3 x weekly - 2 sets - 4 reps - 10 hold  GOALS: Goals reviewed with patient? Yes   SHORT TERM GOALS: Target date: 12/28/23    Patient will be independent in home exercise program to improve strength/mobility for better functional independence with ADLs. Baseline:to be given at next session; 10/23/2023- Patient reports independence with HEP provided and no significant issues.   Goal status: MET 2. Pt will be able to ambulate for at least 5 min without stopping with LRAD to allow improved access to community and return to PLOF  Baseline: 3 minutes. 2/11: 6 minute walk test completed.   Goal Status: MET   . LONG TERM GOALS: Target date: 07/16/2024   Patient will increase FOTO score to equal to or greater than  61   to demonstrate statistically significant improvement in mobility and quality of life.  Baseline: 56; 10/23/2023= 60 Goal status: PROGRESSING/DISCONTINUED   2.  Patient (> 46 years old) will complete five times sit to stand test in < 15 seconds  without UE support indicating an increased LE strength and improved balance. Baseline: 30.86 sec; 10/23/2023= 22.08 using 1 UE support.  12/11: 14.67 sec BUE from arm rest. And 14.12 with 1 UE pushing from arm rest. 01/30/24 13 seconds with UE  3/26: 15.0  able to achieve full stand on each repetition  Goal status: MET  3.  Patient will increase Berg Balance score to >45 points to demonstrate decreased fall risk during functional activities  Baseline: 26; 10/23/2023= 30 12/11: 34 01/30/24: 41/56 3/26: 40/56 04/23/2024= 44/56 Goal status: Progressing  4.  Patient will increase 10 meter walk test to >1.62m/s as to improve gait speed for better community ambulation and to reduce fall risk. Baseline: 0.640m/s;  10/23/2023= 0.88 m/s (normal speed with 4WW) 12/11: Average Fast speed: 1.05 m/s Average Normal speed: 0.834 m/s 01/30/24: 1.14 m/s 3/26: 1.0 m/s Goal status: MET  5.  Patient will reduce timed up and go to <11 seconds to reduce fall risk and demonstrate improved  transfer/gait ability. Baseline: 29.39 12/2: 16.4 sec with Rollator  12/11: 15.19 sec with rollator  2/4: 17.38 sec with rollator  3/4: 15 sec with rollator  3/26: 13.76 sec with rollator  04/23/2024= 12.34 sec avg with SPC Goal status: IN PROGRESS  6.  Patient will 2 min walk test by at least 61ft as to demonstrate reduced fall risk and improved dynamic gait balance for better safety with community/home ambulation.   Baseline: 272ft; 10/23/2023= 360 feet  with 4WW 35ft with 4WW Goal status: MET  7. Pt will complete 6 min walk test without stopping and improve overall score by at least 185ft to indicate increased access to community.   Baseline: to be complete 2/11:880 ft with RW; 01/30/24: 1168 ft with RW  3/26: 1123ft with Rollator  Goal: MET    ASSESSMENT: CLINICAL IMPRESSION:  Chartered loss adjuster continued plan as laid out in recent sessions. Pt tolerated interventions well without pain or significant fatigue. She did express some FOF with obstacle clearance exercises and required at least intermittent UE support throughout. Ms. Lamarche will continue to benefit from skilled physical therapy intervention to address stated impairments, improve QOL, decrease fall risk, and attain therapy goals.       OBJECTIVE IMPAIRMENTS: Abnormal gait, cardiopulmonary status limiting activity, decreased activity tolerance, decreased balance, decreased endurance, decreased mobility, difficulty walking, decreased ROM, decreased strength, decreased safety awareness, hypomobility, increased fascial restrictions, impaired flexibility, improper body mechanics, and postural dysfunction.   ACTIVITY LIMITATIONS: lifting, standing, squatting, transfers, and  locomotion level  PARTICIPATION LIMITATIONS: laundry, shopping, and community activity  PERSONAL FACTORS: 3+ comorbidities: scoliosis, hx of CA, and PE are also affecting patient's functional outcome.   REHAB POTENTIAL: Good  CLINICAL DECISION MAKING: Stable/uncomplicated  EVALUATION COMPLEXITY: Moderate  PLAN:  PT FREQUENCY: 1-2x/week  PT DURATION: 12 weeks  PLANNED INTERVENTIONS: Therapeutic exercises, Therapeutic activity, Neuromuscular re-education, Balance training, Gait training, Patient/Family education, Self Care, Joint mobilization, Stair training, Vestibular training, DME instructions, Dry Needling, Spinal mobilization, Cryotherapy, Moist heat, and Manual therapy  PLAN FOR NEXT SESSION: Continue Dynamic balance and gait training. Progressing to higher level variable direction stepping and reaching tasks  BLE and postural strengthening.  Aminta Kales PT, DPT  Physical Therapist - [facility]  Outpatient Physical Therapy- [facility] [phone]    5:22 PM 05/08/24

## 2024-05-09 ENCOUNTER — Ambulatory Visit: Payer: Medicare Other

## 2024-05-14 ENCOUNTER — Ambulatory Visit: Payer: Medicare Other

## 2024-05-14 DIAGNOSIS — R2689 Other abnormalities of gait and mobility: Secondary | ICD-10-CM

## 2024-05-14 DIAGNOSIS — M6281 Muscle weakness (generalized): Secondary | ICD-10-CM

## 2024-05-14 DIAGNOSIS — R262 Difficulty in walking, not elsewhere classified: Secondary | ICD-10-CM | POA: Diagnosis not present

## 2024-05-14 DIAGNOSIS — R2681 Unsteadiness on feet: Secondary | ICD-10-CM

## 2024-05-14 DIAGNOSIS — M5459 Other low back pain: Secondary | ICD-10-CM

## 2024-05-14 DIAGNOSIS — R278 Other lack of coordination: Secondary | ICD-10-CM

## 2024-05-14 NOTE — Therapy (Signed)
 OUTPATIENT PHYSICAL THERAPY NEURO TREATMENT NOTE     Patient Name: Kristina Rowe MRN: [id number] DOB:12/08/1929, 89 y.o., female Today's Date: 05/14/2024   PCP: Rex Castor, MD  REFERRING PROVIDER:   Clarinda Crome, PA-C    END OF SESSION:   PT End of Session - 05/14/24 1403     Visit Number 42    Number of Visits 59    Date for PT Re-Evaluation 07/16/24    Progress Note Due on Visit 50    PT Start Time 1401    PT Stop Time 1445    PT Time Calculation (min) 44 min    Equipment Utilized During Treatment Gait belt    Activity Tolerance Patient tolerated treatment well;No increased pain    Behavior During Therapy WFL for tasks assessed/performed                    Past Medical History:  Diagnosis Date   Ductal carcinoma in situ (DCIS) of left breast    Dysphagia    Esophageal stricture    Gastroparesis    GERD (gastroesophageal reflux disease)    Hiatal hernia    Hypertension    Iron deficiency anemia    Osteoporosis    Pulmonary embolism (HCC)    Scoliosis    UTI (urinary tract infection)    Past Surgical History:  Procedure Laterality Date   BREAST LUMPECTOMY Left 2009   Ductal Carcinoma insitu    CATARACT EXTRACTION, BILATERAL     COLONOSCOPY N/A 03/17/2021   Procedure: COLONOSCOPY;  Surgeon: Shane Darling, MD;  Location: [facility];  Service: Endoscopy;  Laterality: N/A;   COLOSTOMY REVISION Right 03/18/2021   Procedure: COLON RESECTION RIGHT;  Surgeon: Emmalene Hare, MD;  Location: [facility];  Service: General;  Laterality: Right;   LAPAROSCOPIC PARAESOPHAGEAL HERNIA REPAIR  2009   LAPAROTOMY N/A 03/10/2021   Procedure: EXPLORATORY LAPAROTOMY;  Surgeon: Emmalene Hare, MD;  Location: [facility];  Service: General;  Laterality: N/A;   TUBAL LIGATION     Patient Active Problem List   Diagnosis Date Noted   Acute on chronic diastolic CHF (congestive heart failure) (HCC) 08/19/2023   Acute respiratory failure with hypoxia (HCC)  08/17/2023   History of pulmonary embolus (PE) 08/17/2023   Basal cell carcinoma of leg 04/01/2021   Coronary atherosclerosis 04/01/2021   History of pulmonary embolism 04/01/2021   Pulmonary nodule 04/01/2021   Malignant neoplasm of colon (HCC) 03/29/2021   Rectal bleeding 03/15/2021   Hypertension    GERD (gastroesophageal reflux disease)    Colonic mass    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HCC) 03/10/2021   Anemia, unspecified 03/06/2021   Bronchiectasis without complication (HCC) 03/27/2020   Iron deficiency anemia 03/27/2020   Moderate aortic stenosis 03/27/2020   Stage 3a chronic kidney disease (HCC) 07/04/2019   Hypnic headache 05/28/2019   Telogen effluvium 04/17/2019   Xerosis cutis 04/17/2019   Essential hypertension 01/14/2019   Cervical radiculopathy 05/04/2018   Osteoarthritis of left glenohumeral joint 05/04/2018   Vitamin B12 deficiency 01/01/2018   Vascular abnormality 09/11/2017   Dyspepsia 03/28/2017   Pulmonary embolism (HCC) 08/16/2016   Dilated pore of [place] 03/23/2015   Retinal drusen of both eyes 08/28/2014   Status post right cataract extraction 07/30/2014   Verruca 03/19/2014   Chronic back pain 11/06/2013   Postmenopausal osteoporosis 06/11/2013   Anticoagulated on Coumadin  04/06/2013   Long term (current) use of anticoagulants 10/18/2012   Preglaucoma 05/10/2012   Presbyopia 05/10/2012  Senile nuclear sclerosis 05/10/2012   Lobular carcinoma in situ of breast 04/13/2012   Closed fracture of ankle 02/27/2012   History of nonmelanoma skin cancer 09/05/2011   Other benign neoplasm of skin of trunk 09/05/2011   Osteoporosis 08/25/2011   Transient alteration of awareness 09/03/2009   Colon adenoma 07/30/2002    ONSET DATE: 3 months   REFERRING DIAG:  Diagnosis  R53.83 (ICD-10-CM) - Other fatigue    THERAPY DIAG:  Muscle weakness (generalized)  Difficulty in walking, not elsewhere classified  Unsteadiness on feet  Other abnormalities of gait and  mobility  Other low back pain  Other lack of coordination  Rationale for Evaluation and Treatment: Rehabilitation  SUBJECTIVE:                                                                                                                                                                                             SUBJECTIVE STATEMENT:  Patient reports having back pain today.   Pt accompanied by: self  PERTINENT HISTORY:  From recent MD visit.  89 y.o. here for an acute issue. She has a PMH of chronic PE/Coumadin , anemia, colon cancer, breast cancer, large hiatal hernia, moderate aortic stenosis who has concerns about shortness of breath, nausea, generalized weakness. No chest pain. History of hiatal hernia and recent CT chest. Also recent endoscopy. Takes Zofran  chronically. Has felt weaker and almost fell recently. Uses a walker. Also on chronic UTI suppression, Keflex .   PAIN:  Are you having pain? Yes: NPRS scale: 5/10 Pain location: low back  Pain description: sore  Aggravating factors: movement  Relieving factors: rest   PRECAUTIONS: Fall  RED FLAGS: None   WEIGHT BEARING RESTRICTIONS: No  FALLS: Has patient fallen in last 6 months? No  LIVING ENVIRONMENT: Lives with: lives alone Lives in: House/apartment Stairs: Yes: External: 1 steps; none Has following equipment at home: Walker - 4 wheeled  PLOF: Independent with household mobility with device and Independent with community mobility with device  PATIENT GOALS: improved strength in BLE   OBJECTIVE:   DIAGNOSTIC FINDINGS:  06/12/2023 EXAM: CT CHEST, ABDOMEN, AND PELVIS WITH CONTRAST IMPRESSION: 1. Status post right hemicolectomy, without recurrent or metastatic disease. Decreased sensitivity exam, primarily involving the abdomen pelvis, due to paucity of fat. 2. New tiny left groin hernia containing fluid. Consider physical exam correlation. 3. Incidental findings, including: Coronary artery  atherosclerosis. Aortic Atherosclerosis (ICD10-I70.0). Dilated esophagus with contrast throughout, suggesting dysmotility and/or gastroesophageal reflux.  COGNITION: Overall cognitive status: Within functional limits for tasks assessed   SENSATION: Light touch: Impaired  mild paraesthesia in the proximal RLE   COORDINATION:  WFL. Ankle to knee limited ROM due to weakness in the R hip      POSTURE: rounded shoulders, forward head, flexed trunk , and severe scoliosis     LOWER EXTREMITY MMT:    MMT Right Eval Left Eval  Hip flexion 4- 4  Hip extension    Hip abduction 4- 44  Hip adduction 4 4  Hip internal rotation    Hip external rotation    Knee flexion 4- 4  Knee extension 4 4+  Ankle dorsiflexion 4 4+  Ankle plantarflexion    Ankle inversion    Ankle eversion    (Blank rows = not tested)    TRANSFERS: Assistive device utilized: Environmental consultant - 4 wheeled  Sit to stand: SBA Stand to sit: SBA Chair to chair: SBA Floor: Unable to perform    STAIRS: Level of Assistance: pt declines Stair Negotiation Technique: Step to Pattern with Bilateral Rails Number of Stairs: 4  Height of Stairs: 6  Comments: will need to assess.   GAIT: Gait pattern: step through pattern, Right foot flat, and lateral hip instability Distance walked: 271ft Assistive device utilized: Walker - 4 wheeled Level of assistance: Modified independence and SBA Comments: severe scoliosis.    FUNCTIONAL TESTS:  5 times sit to stand: 30.86 Timed up and go (TUG): 29.39 2 minute walk test: 263ft  10 meter walk test: 0.69m/s, SOB 4-5.  Berg Balance Scale: TBD  PATIENT SURVEYS:  FOTO 56. goal 64  TODAY'S TREATMENT:                                                                                                                              DATE:  05/14/24    TA:  Standing lumbar flex into Ext- VC to keep eyes looking ahead- x 12 reps I can feel that in my back.   Side stepping in // bars  down and back x 5 (min UE support)   Forward lunge squat (modified/mini) down and back in // bars x 3.    NMR:   Dynamic High knee march in // bars with large retro steps x 10 each direction.  Step tap alt LE onto 1/2 foam roll- without UE Support x 20 reps each side (VC for erect posture)   Forward step up/over 1/2 foam roll x 15 reps (Min UE support)   Side step up/over 1/2 foam roll x 15 reps (min UE support)          PATIENT EDUCATION: Education details: Pt educated throughout session about proper posture and technique with exercises. Improved exercise technique, movement at target joints, use of target muscles after min to mod verbal, visual, tactile cues.  Person educated: Patient Education method: Explanation, demo, VC Education comprehension: verbalized understanding and returned demonstration  HOME EXERCISE PROGRAM: Access Code: [email or web address] URL: [email or web address] Date: 09/25/2023 Prepared by: Aurora Lees  Exercises - Seated Hip Abduction with Resistance  - 1 x daily - 4 x weekly -  3 sets - 15 reps - 3 hold - Seated March with Resistance  - 1 x daily - 4 x weekly - 3 sets - 10 reps - 1 hold - Seated Long Arc Quad  - 1 x daily - 4 x weekly - 3 sets - 12 reps - 3 hold - Seated Hip Adduction Isometrics with Ball  - 1 x daily - 4 x weekly - 3 sets - 12 reps - 3 hold - Standing March with Counter Support  - 1 x daily - 3 x weekly - 2 sets - 10 reps - Standing Hip Abduction with Counter Support  - 1 x daily - 3 x weekly - 2 sets - 10 reps - Standing Hip Extension with Counter Support  - 1 x daily - 3 x weekly - 2 sets - 10 reps - Mini Squat with Counter Support  - 1 x daily - 3 x weekly - 2 sets - 10 reps - Standing Knee Flexion with Counter Support  - 1 x daily - 3 x weekly - 2 sets - 10 reps - Standing Tandem Balance with Counter Support  - 1 x daily - 3 x weekly - 2 sets - 4 reps - 10 hold  GOALS: Goals reviewed with patient? Yes   SHORT TERM  GOALS: Target date: 12/28/23    Patient will be independent in home exercise program to improve strength/mobility for better functional independence with ADLs. Baseline:to be given at next session; 10/23/2023- Patient reports independence with HEP provided and no significant issues.   Goal status: MET 2. Pt will be able to ambulate for at least 5 min without stopping with LRAD to allow improved access to community and return to PLOF  Baseline: 3 minutes. 2/11: 6 minute walk test completed.   Goal Status: MET   . LONG TERM GOALS: Target date: 07/16/2024   Patient will increase FOTO score to equal to or greater than  61   to demonstrate statistically significant improvement in mobility and quality of life.  Baseline: 56; 10/23/2023= 60 Goal status: PROGRESSING/DISCONTINUED   2.  Patient (> 28 years old) will complete five times sit to stand test in < 15 seconds  without UE support indicating an increased LE strength and improved balance. Baseline: 30.86 sec; 10/23/2023= 22.08 using 1 UE support.  12/11: 14.67 sec BUE from arm rest. And 14.12 with 1 UE pushing from arm rest. 01/30/24 13 seconds with UE  3/26: 15.0  able to achieve full stand on each repetition  Goal status: MET  3.  Patient will increase Berg Balance score to >45 points to demonstrate decreased fall risk during functional activities  Baseline: 26; 10/23/2023= 30 12/11: 34 01/30/24: 41/56 3/26: 40/56 04/23/2024= 44/56 Goal status: Progressing  4.  Patient will increase 10 meter walk test to >1.56m/s as to improve gait speed for better community ambulation and to reduce fall risk. Baseline: 0.684m/s; 10/23/2023= 0.88 m/s (normal speed with 4WW) 12/11: Average Fast speed: 1.05 m/s Average Normal speed: 0.834 m/s 01/30/24: 1.14 m/s 3/26: 1.0 m/s Goal status: MET  5.  Patient will reduce timed up and go to <11 seconds to reduce fall risk and demonstrate improved transfer/gait ability. Baseline: 29.39 12/2: 16.4 sec with  Rollator  12/11: 15.19 sec with rollator  2/4: 17.38 sec with rollator  3/4: 15 sec with rollator  3/26: 13.76 sec with rollator  04/23/2024= 12.34 sec avg with SPC Goal status: IN PROGRESS  6.  Patient will 2 min walk  test by at least 59ft as to demonstrate reduced fall risk and improved dynamic gait balance for better safety with community/home ambulation.   Baseline: 29ft; 10/23/2023= 360 feet  with 4WW 359ft with 4WW Goal status: MET  7. Pt will complete 6 min walk test without stopping and improve overall score by at least 1109ft to indicate increased access to community.   Baseline: to be complete 2/11:880 ft with RW; 01/30/24: 1168 ft with RW  3/26: 1189ft with Rollator  Goal: MET    ASSESSMENT: CLINICAL IMPRESSION:  Patient presented with good motivation despite report of some low back pain. Treatment focused on LE strength, functional mobility, and dynamic balance. She did demonstrated some improved retro steps with increased length and ability to clear her feet and step over object without significant LOB. Ms. Shivley will continue to benefit from skilled physical therapy intervention to address stated impairments, improve QOL, decrease fall risk, and attain therapy goals.       OBJECTIVE IMPAIRMENTS: Abnormal gait, cardiopulmonary status limiting activity, decreased activity tolerance, decreased balance, decreased endurance, decreased mobility, difficulty walking, decreased ROM, decreased strength, decreased safety awareness, hypomobility, increased fascial restrictions, impaired flexibility, improper body mechanics, and postural dysfunction.   ACTIVITY LIMITATIONS: lifting, standing, squatting, transfers, and locomotion level  PARTICIPATION LIMITATIONS: laundry, shopping, and community activity  PERSONAL FACTORS: 3+ comorbidities: scoliosis, hx of CA, and PE are also affecting patient's functional outcome.   REHAB POTENTIAL: Good  CLINICAL DECISION MAKING:  Stable/uncomplicated  EVALUATION COMPLEXITY: Moderate  PLAN:  PT FREQUENCY: 1-2x/week  PT DURATION: 12 weeks  PLANNED INTERVENTIONS: Therapeutic exercises, Therapeutic activity, Neuromuscular re-education, Balance training, Gait training, Patient/Family education, Self Care, Joint mobilization, Stair training, Vestibular training, DME instructions, Dry Needling, Spinal mobilization, Cryotherapy, Moist heat, and Manual therapy  PLAN FOR NEXT SESSION: Continue Dynamic balance and gait training. Progressing to higher level variable direction stepping and reaching tasks  BLE and postural strengthening.  Ossie Blend, PT Physical Therapist - [facility]  Outpatient Physical Therapy- [facility] [phone]    2:50 PM 05/14/24

## 2024-05-16 ENCOUNTER — Ambulatory Visit: Payer: Medicare Other

## 2024-05-21 ENCOUNTER — Ambulatory Visit: Payer: Medicare Other

## 2024-05-21 DIAGNOSIS — M6281 Muscle weakness (generalized): Secondary | ICD-10-CM

## 2024-05-21 DIAGNOSIS — R262 Difficulty in walking, not elsewhere classified: Secondary | ICD-10-CM

## 2024-05-21 DIAGNOSIS — R2689 Other abnormalities of gait and mobility: Secondary | ICD-10-CM

## 2024-05-21 DIAGNOSIS — R278 Other lack of coordination: Secondary | ICD-10-CM

## 2024-05-21 DIAGNOSIS — R2681 Unsteadiness on feet: Secondary | ICD-10-CM

## 2024-05-21 DIAGNOSIS — M5459 Other low back pain: Secondary | ICD-10-CM

## 2024-05-21 NOTE — Therapy (Signed)
 OUTPATIENT PHYSICAL THERAPY NEURO TREATMENT NOTE     Patient Name: Kristina Rowe MRN: [id number] DOB:February 12, 1930, 89 y.o., female Today's Date: 05/21/2024   PCP: Sherial Bail, MD  REFERRING PROVIDER:   Suzzane Charleston, PA-C    END OF SESSION:   PT End of Session - 05/21/24 1400     Visit Number 43    Number of Visits 59    Date for PT Re-Evaluation 07/16/24    Progress Note Due on Visit 50    PT Start Time 1400    PT Stop Time 1445    PT Time Calculation (min) 45 min    Equipment Utilized During Treatment Gait belt    Activity Tolerance Patient tolerated treatment well;No increased pain    Behavior During Therapy WFL for tasks assessed/performed                    Past Medical History:  Diagnosis Date   Ductal carcinoma in situ (DCIS) of left breast    Dysphagia    Esophageal stricture    Gastroparesis    GERD (gastroesophageal reflux disease)    Hiatal hernia    Hypertension    Iron deficiency anemia    Osteoporosis    Pulmonary embolism (HCC)    Scoliosis    UTI (urinary tract infection)    Past Surgical History:  Procedure Laterality Date   BREAST LUMPECTOMY Left 2009   Ductal Carcinoma insitu    CATARACT EXTRACTION, BILATERAL     COLONOSCOPY N/A 03/17/2021   Procedure: COLONOSCOPY;  Surgeon: Maryruth Ole DASEN, MD;  Location: [facility];  Service: Endoscopy;  Laterality: N/A;   COLOSTOMY REVISION Right 03/18/2021   Procedure: COLON RESECTION RIGHT;  Surgeon: Desiderio Schanz, MD;  Location: [facility];  Service: General;  Laterality: Right;   LAPAROSCOPIC PARAESOPHAGEAL HERNIA REPAIR  2009   LAPAROTOMY N/A 03/10/2021   Procedure: EXPLORATORY LAPAROTOMY;  Surgeon: Desiderio Schanz, MD;  Location: [facility];  Service: General;  Laterality: N/A;   TUBAL LIGATION     Patient Active Problem List   Diagnosis Date Noted   Acute on chronic diastolic CHF (congestive heart failure) (HCC) 08/19/2023   Acute respiratory failure with hypoxia (HCC)  08/17/2023   History of pulmonary embolus (PE) 08/17/2023   Basal cell carcinoma of leg 04/01/2021   Coronary atherosclerosis 04/01/2021   History of pulmonary embolism 04/01/2021   Pulmonary nodule 04/01/2021   Malignant neoplasm of colon (HCC) 03/29/2021   Rectal bleeding 03/15/2021   Hypertension    GERD (gastroesophageal reflux disease)    Colonic mass    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HCC) 03/10/2021   Anemia, unspecified 03/06/2021   Bronchiectasis without complication (HCC) 03/27/2020   Iron deficiency anemia 03/27/2020   Moderate aortic stenosis 03/27/2020   Stage 3a chronic kidney disease (HCC) 07/04/2019   Hypnic headache 05/28/2019   Telogen effluvium 04/17/2019   Xerosis cutis 04/17/2019   Essential hypertension 01/14/2019   Cervical radiculopathy 05/04/2018   Osteoarthritis of left glenohumeral joint 05/04/2018   Vitamin B12 deficiency 01/01/2018   Vascular abnormality 09/11/2017   Dyspepsia 03/28/2017   Pulmonary embolism (HCC) 08/16/2016   Dilated pore of [place] 03/23/2015   Retinal drusen of both eyes 08/28/2014   Status post right cataract extraction 07/30/2014   Verruca 03/19/2014   Chronic back pain 11/06/2013   Postmenopausal osteoporosis 06/11/2013   Anticoagulated on Coumadin  04/06/2013   Long term (current) use of anticoagulants 10/18/2012   Preglaucoma 05/10/2012   Presbyopia 05/10/2012  Senile nuclear sclerosis 05/10/2012   Lobular carcinoma in situ of breast 04/13/2012   Closed fracture of ankle 02/27/2012   History of nonmelanoma skin cancer 09/05/2011   Other benign neoplasm of skin of trunk 09/05/2011   Osteoporosis 08/25/2011   Transient alteration of awareness 09/03/2009   Colon adenoma 07/30/2002    ONSET DATE: 3 months   REFERRING DIAG:  Diagnosis  R53.83 (ICD-10-CM) - Other fatigue    THERAPY DIAG:  Muscle weakness (generalized)  Difficulty in walking, not elsewhere classified  Unsteadiness on feet  Other abnormalities of gait and  mobility  Other low back pain  Other lack of coordination  Rationale for Evaluation and Treatment: Rehabilitation  SUBJECTIVE:                                                                                                                                                                                             SUBJECTIVE STATEMENT:  Patient reports hanging in there. Reports the heat is about to get to me.   Pt accompanied by: self  PERTINENT HISTORY:  From recent MD visit.  89 y.o. here for an acute issue. She has a PMH of chronic PE/Coumadin , anemia, colon cancer, breast cancer, large hiatal hernia, moderate aortic stenosis who has concerns about shortness of breath, nausea, generalized weakness. No chest pain. History of hiatal hernia and recent CT chest. Also recent endoscopy. Takes Zofran  chronically. Has felt weaker and almost fell recently. Uses a walker. Also on chronic UTI suppression, Keflex .   PAIN:  Are you having pain? Yes: NPRS scale: 5/10 Pain location: low back  Pain description: sore  Aggravating factors: movement  Relieving factors: rest   PRECAUTIONS: Fall  RED FLAGS: None   WEIGHT BEARING RESTRICTIONS: No  FALLS: Has patient fallen in last 6 months? No  LIVING ENVIRONMENT: Lives with: lives alone Lives in: House/apartment Stairs: Yes: External: 1 steps; none Has following equipment at home: Walker - 4 wheeled  PLOF: Independent with household mobility with device and Independent with community mobility with device  PATIENT GOALS: improved strength in BLE   OBJECTIVE:   DIAGNOSTIC FINDINGS:  06/12/2023 EXAM: CT CHEST, ABDOMEN, AND PELVIS WITH CONTRAST IMPRESSION: 1. Status post right hemicolectomy, without recurrent or metastatic disease. Decreased sensitivity exam, primarily involving the abdomen pelvis, due to paucity of fat. 2. New tiny left groin hernia containing fluid. Consider physical exam correlation. 3. Incidental findings,  including: Coronary artery atherosclerosis. Aortic Atherosclerosis (ICD10-I70.0). Dilated esophagus with contrast throughout, suggesting dysmotility and/or gastroesophageal reflux.  COGNITION: Overall cognitive status: Within functional limits for tasks assessed   SENSATION: Light touch: Impaired  mild  paraesthesia in the proximal RLE   COORDINATION: WFL. Ankle to knee limited ROM due to weakness in the R hip      POSTURE: rounded shoulders, forward head, flexed trunk , and severe scoliosis     LOWER EXTREMITY MMT:    MMT Right Eval Left Eval  Hip flexion 4- 4  Hip extension    Hip abduction 4- 44  Hip adduction 4 4  Hip internal rotation    Hip external rotation    Knee flexion 4- 4  Knee extension 4 4+  Ankle dorsiflexion 4 4+  Ankle plantarflexion    Ankle inversion    Ankle eversion    (Blank rows = not tested)    TRANSFERS: Assistive device utilized: Environmental consultant - 4 wheeled  Sit to stand: SBA Stand to sit: SBA Chair to chair: SBA Floor: Unable to perform    STAIRS: Level of Assistance: pt declines Stair Negotiation Technique: Step to Pattern with Bilateral Rails Number of Stairs: 4  Height of Stairs: 6  Comments: will need to assess.   GAIT: Gait pattern: step through pattern, Right foot flat, and lateral hip instability Distance walked: 271ft Assistive device utilized: Walker - 4 wheeled Level of assistance: Modified independence and SBA Comments: severe scoliosis.    FUNCTIONAL TESTS:  5 times sit to stand: 30.86 Timed up and go (TUG): 29.39 2 minute walk test: 235ft  10 meter walk test: 0.652m/s, SOB 4-5.  Berg Balance Scale: TBD  PATIENT SURVEYS:  FOTO 56. goal 64  TODAY'S TREATMENT:                                                                                                                              DATE:  05/21/24    TA:  Standing lumbar flex into Ext- VC to keep eyes looking ahead- x 12 reps I can feel that in my back.    Side stepping in // bars down and back x 5 (min UE support)   Forward lunge squat (modified/mini) down and back in // bars x 3.   Circuit style LE exercises: 1)NMR: Dynamic High knee march in // bars - down and back x  8 each direction. (VC for step height)  2)TE: Seated LAQ alt LE x 12 reps 3) TA: 2 laps (300 feet) walking with 4WW in gym 1 min rest 4) NMR- posture- lumbar flex into ext x 10 reps  5) TA: Sit to stand with BUE support x 12 reps  6) TA: 2 laps (300 feet) walking with 4WW in gym- gait speed captured at 1.1 m/s 7) TA=Forward step up onto 6 step x 15 reps (Min UE support)  8) NMR=Side step up/over 1/2 foam roll x 15 reps ( NoUE support)  1 min rest 9) TE: calf raises from 1/2 foam roll - 2 x 10  10) TE:  toe raises from 1/2 foam roll- 2 x 10  PATIENT EDUCATION: Education details: Pt educated throughout session about proper posture and technique with exercises. Improved exercise technique, movement at target joints, use of target muscles after min to mod verbal, visual, tactile cues.  Person educated: Patient Education method: Explanation, demo, VC Education comprehension: verbalized understanding and returned demonstration  HOME EXERCISE PROGRAM: Access Code: [email or web address] URL: [email or web address] Date: 09/25/2023 Prepared by: Massie Dollar  Exercises - Seated Hip Abduction with Resistance  - 1 x daily - 4 x weekly - 3 sets - 15 reps - 3 hold - Seated March with Resistance  - 1 x daily - 4 x weekly - 3 sets - 10 reps - 1 hold - Seated Long Arc Quad  - 1 x daily - 4 x weekly - 3 sets - 12 reps - 3 hold - Seated Hip Adduction Isometrics with Ball  - 1 x daily - 4 x weekly - 3 sets - 12 reps - 3 hold - Standing March with Counter Support  - 1 x daily - 3 x weekly - 2 sets - 10 reps - Standing Hip Abduction with Counter Support  - 1 x daily - 3 x weekly - 2 sets - 10 reps - Standing Hip Extension with Counter Support  - 1 x  daily - 3 x weekly - 2 sets - 10 reps - Mini Squat with Counter Support  - 1 x daily - 3 x weekly - 2 sets - 10 reps - Standing Knee Flexion with Counter Support  - 1 x daily - 3 x weekly - 2 sets - 10 reps - Standing Tandem Balance with Counter Support  - 1 x daily - 3 x weekly - 2 sets - 4 reps - 10 hold  GOALS: Goals reviewed with patient? Yes   SHORT TERM GOALS: Target date: 12/28/23    Patient will be independent in home exercise program to improve strength/mobility for better functional independence with ADLs. Baseline:to be given at next session; 10/23/2023- Patient reports independence with HEP provided and no significant issues.   Goal status: MET 2. Pt will be able to ambulate for at least 5 min without stopping with LRAD to allow improved access to community and return to PLOF  Baseline: 3 minutes. 2/11: 6 minute walk test completed.   Goal Status: MET   . LONG TERM GOALS: Target date: 07/16/2024   Patient will increase FOTO score to equal to or greater than  61   to demonstrate statistically significant improvement in mobility and quality of life.  Baseline: 56; 10/23/2023= 60 Goal status: PROGRESSING/DISCONTINUED   2.  Patient (> 29 years old) will complete five times sit to stand test in < 15 seconds  without UE support indicating an increased LE strength and improved balance. Baseline: 30.86 sec; 10/23/2023= 22.08 using 1 UE support.  12/11: 14.67 sec BUE from arm rest. And 14.12 with 1 UE pushing from arm rest. 01/30/24 13 seconds with UE  3/26: 15.0  able to achieve full stand on each repetition  Goal status: MET  3.  Patient will increase Berg Balance score to >45 points to demonstrate decreased fall risk during functional activities  Baseline: 26; 10/23/2023= 30 12/11: 34 01/30/24: 41/56 3/26: 40/56 04/23/2024= 44/56 Goal status: Progressing  4.  Patient will increase 10 meter walk test to >1.66m/s as to improve gait speed for better community ambulation and to  reduce fall risk. Baseline: 0.634m/s; 10/23/2023= 0.88 m/s (normal speed with 4WW) 12/11: Average Fast speed: 1.05 m/s Average Normal speed:  0.834 m/s 01/30/24: 1.14 m/s 3/26: 1.0 m/s Goal status: MET  5.  Patient will reduce timed up and go to <11 seconds to reduce fall risk and demonstrate improved transfer/gait ability. Baseline: 29.39 12/2: 16.4 sec with Rollator  12/11: 15.19 sec with rollator  2/4: 17.38 sec with rollator  3/4: 15 sec with rollator  3/26: 13.76 sec with rollator  04/23/2024= 12.34 sec avg with SPC Goal status: IN PROGRESS  6.  Patient will 2 min walk test by at least 66ft as to demonstrate reduced fall risk and improved dynamic gait balance for better safety with community/home ambulation.   Baseline: 21ft; 10/23/2023= 360 feet  with 4WW 344ft with 4WW Goal status: MET  7. Pt will complete 6 min walk test without stopping and improve overall score by at least 128ft to indicate increased access to community.   Baseline: to be complete 2/11:880 ft with RW; 01/30/24: 1168 ft with RW  3/26: 1152ft with Rollator  Goal: MET    ASSESSMENT: CLINICAL IMPRESSION:  Treatment focused on functional LE strengthening and muscle endurance along with some balance activities. Treatment was set up in circuit style designed to keep patient moving through session with minimal rest breaks. She responded well overall- no significant report of overall fatigue until end of session. She ambulated well throughout session- maintaining her normal gait speed despite any fatigue. Ms. Delisi will continue to benefit from skilled physical therapy intervention to address stated impairments, improve QOL, decrease fall risk, and attain therapy goals.       OBJECTIVE IMPAIRMENTS: Abnormal gait, cardiopulmonary status limiting activity, decreased activity tolerance, decreased balance, decreased endurance, decreased mobility, difficulty walking, decreased ROM, decreased strength, decreased safety  awareness, hypomobility, increased fascial restrictions, impaired flexibility, improper body mechanics, and postural dysfunction.   ACTIVITY LIMITATIONS: lifting, standing, squatting, transfers, and locomotion level  PARTICIPATION LIMITATIONS: laundry, shopping, and community activity  PERSONAL FACTORS: 3+ comorbidities: scoliosis, hx of CA, and PE are also affecting patient's functional outcome.   REHAB POTENTIAL: Good  CLINICAL DECISION MAKING: Stable/uncomplicated  EVALUATION COMPLEXITY: Moderate  PLAN:  PT FREQUENCY: 1-2x/week  PT DURATION: 12 weeks  PLANNED INTERVENTIONS: Therapeutic exercises, Therapeutic activity, Neuromuscular re-education, Balance training, Gait training, Patient/Family education, Self Care, Joint mobilization, Stair training, Vestibular training, DME instructions, Dry Needling, Spinal mobilization, Cryotherapy, Moist heat, and Manual therapy  PLAN FOR NEXT SESSION: Continue Dynamic balance and gait training. Progressing to higher level variable direction stepping and reaching tasks  BLE and postural strengthening.  Chyrl London, PT Physical Therapist - [facility]  Outpatient Physical Therapy- [facility] [phone]    3:02 PM 05/21/24

## 2024-05-23 ENCOUNTER — Ambulatory Visit: Payer: Medicare Other

## 2024-05-23 ENCOUNTER — Ambulatory Visit: Admitting: Physical Therapy

## 2024-05-28 ENCOUNTER — Ambulatory Visit: Payer: Medicare Other | Attending: Physician Assistant

## 2024-05-28 DIAGNOSIS — M5459 Other low back pain: Secondary | ICD-10-CM | POA: Diagnosis present

## 2024-05-28 DIAGNOSIS — R278 Other lack of coordination: Secondary | ICD-10-CM | POA: Diagnosis present

## 2024-05-28 DIAGNOSIS — R2681 Unsteadiness on feet: Secondary | ICD-10-CM | POA: Diagnosis present

## 2024-05-28 DIAGNOSIS — R2689 Other abnormalities of gait and mobility: Secondary | ICD-10-CM | POA: Insufficient documentation

## 2024-05-28 DIAGNOSIS — M6281 Muscle weakness (generalized): Secondary | ICD-10-CM | POA: Insufficient documentation

## 2024-05-28 DIAGNOSIS — R262 Difficulty in walking, not elsewhere classified: Secondary | ICD-10-CM | POA: Diagnosis present

## 2024-05-28 NOTE — Therapy (Signed)
 OUTPATIENT PHYSICAL THERAPY TREATMENT   Patient Name: Kristina Rowe MRN: [id number] DOB:[birth year], 89 y.o., female Today's Date: 05/28/2024  PCP: Sherial Bail, MD  REFERRING PROVIDER:   Suzzane Charleston, PA-C   END OF SESSION:   PT End of Session - 05/28/24 1457     Visit Number 44    Number of Visits 59    Date for PT Re-Evaluation 07/16/24    Progress Note Due on Visit 50    PT Start Time 1450    PT Stop Time 1530    PT Time Calculation (min) 40 min    Activity Tolerance Patient tolerated treatment well;No increased pain    Behavior During Therapy WFL for tasks assessed/performed         Past Medical History:  Diagnosis Date   Ductal carcinoma in situ (DCIS) of left breast    Dysphagia    Esophageal stricture    Gastroparesis    GERD (gastroesophageal reflux disease)    Hiatal hernia    Hypertension    Iron deficiency anemia    Osteoporosis    Pulmonary embolism (HCC)    Scoliosis    UTI (urinary tract infection)    Past Surgical History:  Procedure Laterality Date   BREAST LUMPECTOMY Left 2009   Ductal Carcinoma insitu    CATARACT EXTRACTION, BILATERAL     COLONOSCOPY N/A 03/17/2021   Procedure: COLONOSCOPY;  Surgeon: Maryruth Ole DASEN, MD;  Location: [facility];  Service: Endoscopy;  Laterality: N/A;   COLOSTOMY REVISION Right 03/18/2021   Procedure: COLON RESECTION RIGHT;  Surgeon: Desiderio Schanz, MD;  Location: [facility];  Service: General;  Laterality: Right;   LAPAROSCOPIC PARAESOPHAGEAL HERNIA REPAIR  2009   LAPAROTOMY N/A 03/10/2021   Procedure: EXPLORATORY LAPAROTOMY;  Surgeon: Desiderio Schanz, MD;  Location: [facility];  Service: General;  Laterality: N/A;   TUBAL LIGATION     Patient Active Problem List   Diagnosis Date Noted   Acute on chronic diastolic CHF (congestive heart failure) (HCC) 08/19/2023   Acute respiratory failure with hypoxia (HCC) 08/17/2023   History of pulmonary embolus (PE) 08/17/2023   Basal cell carcinoma of leg  04/01/2021   Coronary atherosclerosis 04/01/2021   History of pulmonary embolism 04/01/2021   Pulmonary nodule 04/01/2021   Malignant neoplasm of colon (HCC) 03/29/2021   Rectal bleeding 03/15/2021   Hypertension    GERD (gastroesophageal reflux disease)    Colonic mass    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HCC) 03/10/2021   Anemia, unspecified 03/06/2021   Bronchiectasis without complication (HCC) 03/27/2020   Iron deficiency anemia 03/27/2020   Moderate aortic stenosis 03/27/2020   Stage 3a chronic kidney disease (HCC) 07/04/2019   Hypnic headache 05/28/2019   Telogen effluvium 04/17/2019   Xerosis cutis 04/17/2019   Essential hypertension 01/14/2019   Cervical radiculopathy 05/04/2018   Osteoarthritis of left glenohumeral joint 05/04/2018   Vitamin B12 deficiency 01/01/2018   Vascular abnormality 09/11/2017   Dyspepsia 03/28/2017   Pulmonary embolism (HCC) 08/16/2016   Dilated pore of [place] 03/23/2015   Retinal drusen of both eyes 08/28/2014   Status post right cataract extraction 07/30/2014   Verruca 03/19/2014   Chronic back pain 11/06/2013   Postmenopausal osteoporosis 06/11/2013   Anticoagulated on Coumadin  04/06/2013   Long term (current) use of anticoagulants 10/18/2012   Preglaucoma 05/10/2012   Presbyopia 05/10/2012   Senile nuclear sclerosis 05/10/2012   Lobular carcinoma in situ of breast 04/13/2012   Closed fracture of ankle 02/27/2012   History of nonmelanoma skin  cancer 09/05/2011   Other benign neoplasm of skin of trunk 09/05/2011   Osteoporosis 08/25/2011   Transient alteration of awareness 09/03/2009   Colon adenoma 07/30/2002    ONSET DATE: 3 months   REFERRING DIAG:  Diagnosis  R53.83 (ICD-10-CM) - Other fatigue    THERAPY DIAG:  Muscle weakness (generalized)  Difficulty in walking, not elsewhere classified  Unsteadiness on feet  Other abnormalities of gait and mobility  Other low back pain  Other lack of coordination  Rationale for Evaluation  and Treatment: Rehabilitation  SUBJECTIVE:                                                                                                                                                                                             SUBJECTIVE STATEMENT:  Patient reports hanging in there. Has some cooking to get started later today, fried chicken none the less. No other updates.   Pt accompanied by: self  PERTINENT HISTORY:  From recent MD visit.  89 y.o. here for an acute issue. She has a PMH of chronic PE/Coumadin , anemia, colon cancer, breast cancer, large hiatal hernia, moderate aortic stenosis who has concerns about shortness of breath, nausea, generalized weakness. No chest pain. History of hiatal hernia and recent CT chest. Also recent endoscopy. Takes Zofran  chronically. Has felt weaker and almost fell recently. Uses a walker. Also on chronic UTI suppression, Keflex .   PAIN:  Are you having pain?  Yes, typical lef tlow back pain 8/10  PRECAUTIONS: Fall  RED FLAGS: None   WEIGHT BEARING RESTRICTIONS: No  FALLS: Has patient fallen in last 6 months? No  LIVING ENVIRONMENT: Lives with: lives alone Lives in: House/apartment Stairs: Yes: External: 1 steps; none Has following equipment at home: Walker - 4 wheeled  PLOF: Independent with household mobility with device and Independent with community mobility with device  PATIENT GOALS: improved strength in BLE   OBJECTIVE:   DIAGNOSTIC FINDINGS:  06/12/2023 EXAM: CT CHEST, ABDOMEN, AND PELVIS WITH CONTRAST IMPRESSION: 1. Status post right hemicolectomy, without recurrent or metastatic disease. Decreased sensitivity exam, primarily involving the abdomen pelvis, due to paucity of fat. 2. New tiny left groin hernia containing fluid. Consider physical exam correlation. 3. Incidental findings, including: Coronary artery atherosclerosis. Aortic Atherosclerosis (ICD10-I70.0). Dilated esophagus with contrast throughout,  suggesting dysmotility and/or gastroesophageal reflux.  COGNITION: Overall cognitive status: Within functional limits for tasks assessed   SENSATION: Light touch: Impaired  mild paraesthesia in the proximal RLE   COORDINATION: WFL. Ankle to knee limited ROM due to weakness in the R hip      POSTURE: rounded shoulders, forward head, flexed  trunk , and severe scoliosis     LOWER EXTREMITY MMT:    MMT Right Eval Left Eval  Hip flexion 4- 4  Hip extension    Hip abduction 4- 44  Hip adduction 4 4  Hip internal rotation    Hip external rotation    Knee flexion 4- 4  Knee extension 4 4+  Ankle dorsiflexion 4 4+  Ankle plantarflexion    Ankle inversion    Ankle eversion    (Blank rows = not tested)    TRANSFERS: Assistive device utilized: Environmental consultant - 4 wheeled  Sit to stand: SBA Stand to sit: SBA Chair to chair: SBA Floor: Unable to perform    STAIRS: Level of Assistance: pt declines Stair Negotiation Technique: Step to Pattern with Bilateral Rails Number of Stairs: 4  Height of Stairs: 6  Comments: will need to assess.   GAIT: Gait pattern: step through pattern, Right foot flat, and lateral hip instability Distance walked: 244ft Assistive device utilized: Walker - 4 wheeled Level of assistance: Modified independence and SBA Comments: severe scoliosis.    FUNCTIONAL TESTS:  5 times sit to stand: 30.86 Timed up and go (TUG): 29.39 2 minute walk test: 238ft  10 meter walk test: 0.626m/s, SOB 4-5.  Berg Balance Scale: TBD  PATIENT SURVEYS:  FOTO 56. goal 57  TODAY'S TREATMENT 05/28/24   -forward high-knee marching in // bars, backward 8x each way  -lateral side stepping in // bars 6x each way -overground AMB c 4WW 461ft, 42m28s (0.61m/s)  -sit to stand with BUE support x 12 reps -seated cable downward row MATRIX 7.5lb x12 -BUE GHJ ER c YTB resistance x15 -6 forward step up/down, turnaround x16   PATIENT EDUCATION: Education details: Pt educated  throughout session about proper posture and technique with exercises. Improved exercise technique, movement at target joints, use of target muscles after min to mod verbal, visual, tactile cues.  Person educated: Patient Education method: Explanation, demo, VC Education comprehension: verbalized understanding and returned demonstration  HOME EXERCISE PROGRAM: Access Code: [email or web address] URL: [email or web address] Date: 09/25/2023 Prepared by: Massie Dollar  Exercises - Seated Hip Abduction with Resistance  - 1 x daily - 4 x weekly - 3 sets - 15 reps - 3 hold - Seated March with Resistance  - 1 x daily - 4 x weekly - 3 sets - 10 reps - 1 hold - Seated Long Arc Quad  - 1 x daily - 4 x weekly - 3 sets - 12 reps - 3 hold - Seated Hip Adduction Isometrics with Ball  - 1 x daily - 4 x weekly - 3 sets - 12 reps - 3 hold - Standing March with Counter Support  - 1 x daily - 3 x weekly - 2 sets - 10 reps - Standing Hip Abduction with Counter Support  - 1 x daily - 3 x weekly - 2 sets - 10 reps - Standing Hip Extension with Counter Support  - 1 x daily - 3 x weekly - 2 sets - 10 reps - Mini Squat with Counter Support  - 1 x daily - 3 x weekly - 2 sets - 10 reps - Standing Knee Flexion with Counter Support  - 1 x daily - 3 x weekly - 2 sets - 10 reps - Standing Tandem Balance with Counter Support  - 1 x daily - 3 x weekly - 2 sets - 4 reps - 10 hold  GOALS: Goals reviewed with patient? Yes  SHORT TERM GOALS:  Target date: 12/28/23  Patient will be independent in home exercise program to improve strength/mobility for better functional independence with ADLs. Baseline:to be given at next session; 10/23/2023- Patient reports independence with HEP provided and no significant issues.   Goal status: MET 2. Pt will be able to ambulate for at least 5 min without stopping with LRAD to allow improved access to community and return to PLOF  Baseline: 3 minutes. 2/11: 6 minute walk test completed.    Goal Status: MET  . LONG TERM GOALS: Target date: 07/16/2024 Patient will increase FOTO score to equal to or greater than 61 to demonstrate statistically significant improvement in mobility and quality of life.  Baseline: 56; 10/23/2023= 60 Goal status: PROGRESSING/DISCONTINUED   2.  Patient (> 67 years old) will complete five times sit to stand test in < 15 seconds  without UE support indicating an increased LE strength and improved balance. Baseline: 30.86 sec; 10/23/2023= 22.08 using 1 UE support.  12/11: 14.67 sec BUE from arm rest. And 14.12 with 1 UE pushing from arm rest. 01/30/24 13 seconds with UE  3/26: 15.0  able to achieve full stand on each repetition  Goal status: MET  3.  Patient will increase Berg Balance score to >45 points to demonstrate decreased fall risk during functional activities  Baseline: 26; 10/23/2023= 30 12/11: 34 01/30/24: 41/56 3/26: 40/56 04/23/2024= 44/56 Goal status: Progressing  4.  Patient will increase 10 meter walk test to >1.74m/s as to improve gait speed for better community ambulation and to reduce fall risk. Baseline: 0.640m/s; 10/23/2023= 0.88 m/s (normal speed with 4WW) 12/11: Average Fast speed: 1.05 m/s Average Normal speed: 0.834 m/s 01/30/24: 1.14 m/s 3/26: 1.0 m/s Goal status: MET  5.  Patient will reduce timed up and go to <11 seconds to reduce fall risk and demonstrate improved transfer/gait ability. Baseline: 29.39 12/2: 16.4 sec with Rollator  12/11: 15.19 sec with rollator  2/4: 17.38 sec with rollator  3/4: 15 sec with rollator  3/26: 13.76 sec with rollator  04/23/2024= 12.34 sec avg with SPC Goal status: IN PROGRESS  6.  Patient will 2 min walk test by at least 17ft as to demonstrate reduced fall risk and improved dynamic gait balance for better safety with community/home ambulation.  Baseline: 253ft; 10/23/2023= 360 feet  with 4WW 368ft with 4WW Goal status: MET  7. Pt will complete 6 min walk test without stopping and  improve overall score by at least 110ft to indicate increased access to community.  Baseline: to be complete 2/11:880 ft with RW; 01/30/24: 1168 ft with RW  3/26: 1177ft with Rollator  Goal: MET    ASSESSMENT: CLINICAL IMPRESSION:  Treatment maintain focused on functional LE strengthening and muscle endurance along with some balance activities. Treatment was set up in circuit style designed to keep patient moving through session with minimal rest breaks. Added in cable rows for scapular strengthening. Kristina Rowe will continue to benefit from skilled physical therapy intervention to address stated impairments, improve QOL, decrease fall risk, and attain therapy goals.    OBJECTIVE IMPAIRMENTS: Abnormal gait, cardiopulmonary status limiting activity, decreased activity tolerance, decreased balance, decreased endurance, decreased mobility, difficulty walking, decreased ROM, decreased strength, decreased safety awareness, hypomobility, increased fascial restrictions, impaired flexibility, improper body mechanics, and postural dysfunction.   ACTIVITY LIMITATIONS: lifting, standing, squatting, transfers, and locomotion level  PARTICIPATION LIMITATIONS: laundry, shopping, and community activity  PERSONAL FACTORS: 3+ comorbidities: scoliosis, hx of CA, and PE are also affecting patient's functional outcome.  REHAB POTENTIAL: Good  CLINICAL DECISION MAKING: Stable/uncomplicated  EVALUATION COMPLEXITY: Moderate  PLAN:  PT FREQUENCY: 1-2x/week  PT DURATION: 12 weeks  PLANNED INTERVENTIONS: Therapeutic exercises, Therapeutic activity, Neuromuscular re-education, Balance training, Gait training, Patient/Family education, Self Care, Joint mobilization, Stair training, Vestibular training, DME instructions, Dry Needling, Spinal mobilization, Cryotherapy, Moist heat, and Manual therapy  PLAN FOR NEXT SESSION: Continue Dynamic balance and gait training. Progressing to higher level variable direction  stepping and reaching tasks. BLE and postural strengthening.  3:00 PM, 05/28/24 Peggye JAYSON Linear, PT, DPT Physical Therapist - [facility]  [phone] [facility])

## 2024-05-30 ENCOUNTER — Ambulatory Visit: Payer: Medicare Other

## 2024-05-30 DIAGNOSIS — R2681 Unsteadiness on feet: Secondary | ICD-10-CM

## 2024-05-30 DIAGNOSIS — R262 Difficulty in walking, not elsewhere classified: Secondary | ICD-10-CM

## 2024-05-30 DIAGNOSIS — M5459 Other low back pain: Secondary | ICD-10-CM

## 2024-05-30 DIAGNOSIS — R2689 Other abnormalities of gait and mobility: Secondary | ICD-10-CM

## 2024-05-30 DIAGNOSIS — M6281 Muscle weakness (generalized): Secondary | ICD-10-CM

## 2024-05-30 NOTE — Therapy (Signed)
 OUTPATIENT PHYSICAL THERAPY TREATMENT   Patient Name: Kristina Rowe MRN: [id number] DOB:05-01-1930, 89 y.o., female Today's Date: 05/30/2024  PCP: Sherial Bail, MD  REFERRING PROVIDER:   Suzzane Charleston, PA-C   END OF SESSION:   PT End of Session - 05/30/24 1533     Visit Number 45    Number of Visits 59    Date for PT Re-Evaluation 07/16/24    Progress Note Due on Visit 50    PT Start Time 1530    PT Stop Time 1610    PT Time Calculation (min) 40 min    Equipment Utilized During Treatment Gait belt    Activity Tolerance Patient tolerated treatment well;No increased pain    Behavior During Therapy WFL for tasks assessed/performed         Past Medical History:  Diagnosis Date   Ductal carcinoma in situ (DCIS) of left breast    Dysphagia    Esophageal stricture    Gastroparesis    GERD (gastroesophageal reflux disease)    Hiatal hernia    Hypertension    Iron deficiency anemia    Osteoporosis    Pulmonary embolism (HCC)    Scoliosis    UTI (urinary tract infection)    Past Surgical History:  Procedure Laterality Date   BREAST LUMPECTOMY Left 2009   Ductal Carcinoma insitu    CATARACT EXTRACTION, BILATERAL     COLONOSCOPY N/A 03/17/2021   Procedure: COLONOSCOPY;  Surgeon: Maryruth Ole DASEN, MD;  Location: [facility];  Service: Endoscopy;  Laterality: N/A;   COLOSTOMY REVISION Right 03/18/2021   Procedure: COLON RESECTION RIGHT;  Surgeon: Desiderio Schanz, MD;  Location: [facility];  Service: General;  Laterality: Right;   LAPAROSCOPIC PARAESOPHAGEAL HERNIA REPAIR  2009   LAPAROTOMY N/A 03/10/2021   Procedure: EXPLORATORY LAPAROTOMY;  Surgeon: Desiderio Schanz, MD;  Location: [facility];  Service: General;  Laterality: N/A;   TUBAL LIGATION     Patient Active Problem List   Diagnosis Date Noted   Acute on chronic diastolic CHF (congestive heart failure) (HCC) 08/19/2023   Acute respiratory failure with hypoxia (HCC) 08/17/2023   History of pulmonary embolus (PE)  08/17/2023   Basal cell carcinoma of leg 04/01/2021   Coronary atherosclerosis 04/01/2021   History of pulmonary embolism 04/01/2021   Pulmonary nodule 04/01/2021   Malignant neoplasm of colon (HCC) 03/29/2021   Rectal bleeding 03/15/2021   Hypertension    GERD (gastroesophageal reflux disease)    Colonic mass    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HCC) 03/10/2021   Anemia, unspecified 03/06/2021   Bronchiectasis without complication (HCC) 03/27/2020   Iron deficiency anemia 03/27/2020   Moderate aortic stenosis 03/27/2020   Stage 3a chronic kidney disease (HCC) 07/04/2019   Hypnic headache 05/28/2019   Telogen effluvium 04/17/2019   Xerosis cutis 04/17/2019   Essential hypertension 01/14/2019   Cervical radiculopathy 05/04/2018   Osteoarthritis of left glenohumeral joint 05/04/2018   Vitamin B12 deficiency 01/01/2018   Vascular abnormality 09/11/2017   Dyspepsia 03/28/2017   Pulmonary embolism (HCC) 08/16/2016   Dilated pore of [place] 03/23/2015   Retinal drusen of both eyes 08/28/2014   Status post right cataract extraction 07/30/2014   Verruca 03/19/2014   Chronic back pain 11/06/2013   Postmenopausal osteoporosis 06/11/2013   Anticoagulated on Coumadin  04/06/2013   Long term (current) use of anticoagulants 10/18/2012   Preglaucoma 05/10/2012   Presbyopia 05/10/2012   Senile nuclear sclerosis 05/10/2012   Lobular carcinoma in situ of breast 04/13/2012   Closed fracture  of ankle 02/27/2012   History of nonmelanoma skin cancer 09/05/2011   Other benign neoplasm of skin of trunk 09/05/2011   Osteoporosis 08/25/2011   Transient alteration of awareness 09/03/2009   Colon adenoma 07/30/2002    ONSET DATE: 3 months   REFERRING DIAG:  Diagnosis  R53.83 (ICD-10-CM) - Other fatigue    THERAPY DIAG:  Muscle weakness (generalized)  Difficulty in walking, not elsewhere classified  Unsteadiness on feet  Other abnormalities of gait and mobility  Other low back pain  Rationale  for Evaluation and Treatment: Rehabilitation  SUBJECTIVE:                                                                                                                                                                                             SUBJECTIVE STATEMENT:  Pt doing well. Taught her 67yo son how to fry chicken. She continues to work on exercises at home. Pt would like to work more on balance which makes her the most nervous still.   Pt accompanied by: self  PERTINENT HISTORY:  From recent MD visit.  89 y.o. here for an acute issue. She has a PMH of chronic PE/Coumadin , anemia, colon cancer, breast cancer, large hiatal hernia, moderate aortic stenosis who has concerns about shortness of breath, nausea, generalized weakness. No chest pain. History of hiatal hernia and recent CT chest. Also recent endoscopy. Takes Zofran  chronically. Has felt weaker and almost fell recently. Uses a walker. Also on chronic UTI suppression, Keflex .   PAIN:  Are you having pain?  Yes, typical lef tlow back pain 8/10  PRECAUTIONS: Fall  RED FLAGS: None   WEIGHT BEARING RESTRICTIONS: No  FALLS: Has patient fallen in last 6 months? No  LIVING ENVIRONMENT: Lives with: lives alone Lives in: House/apartment Stairs: Yes: External: 1 steps; none Has following equipment at home: Walker - 4 wheeled  PLOF: Independent with household mobility with device and Independent with community mobility with device  PATIENT GOALS: improved strength in BLE   OBJECTIVE:   DIAGNOSTIC FINDINGS:  06/12/2023 EXAM: CT CHEST, ABDOMEN, AND PELVIS WITH CONTRAST IMPRESSION: 1. Status post right hemicolectomy, without recurrent or metastatic disease. Decreased sensitivity exam, primarily involving the abdomen pelvis, due to paucity of fat. 2. New tiny left groin hernia containing fluid. Consider physical exam correlation. 3. Incidental findings, including: Coronary artery atherosclerosis. Aortic Atherosclerosis  (ICD10-I70.0). Dilated esophagus with contrast throughout, suggesting dysmotility and/or gastroesophageal reflux.  COGNITION: Overall cognitive status: Within functional limits for tasks assessed   SENSATION: Light touch: Impaired  mild paraesthesia in the proximal RLE   COORDINATION: WFL. Ankle to knee limited ROM due  to weakness in the R hip      POSTURE: rounded shoulders, forward head, flexed trunk , and severe scoliosis     LOWER EXTREMITY MMT:    MMT Right Eval Left Eval  Hip flexion 4- 4  Hip extension    Hip abduction 4- 44  Hip adduction 4 4  Hip internal rotation    Hip external rotation    Knee flexion 4- 4  Knee extension 4 4+  Ankle dorsiflexion 4 4+  Ankle plantarflexion    Ankle inversion    Ankle eversion    (Blank rows = not tested)    TRANSFERS: Assistive device utilized: Environmental consultant - 4 wheeled  Sit to stand: SBA Stand to sit: SBA Chair to chair: SBA Floor: Unable to perform    STAIRS: Level of Assistance: pt declines Stair Negotiation Technique: Step to Pattern with Bilateral Rails Number of Stairs: 4  Height of Stairs: 6  Comments: will need to assess.   GAIT: Gait pattern: step through pattern, Right foot flat, and lateral hip instability Distance walked: 25ft Assistive device utilized: Walker - 4 wheeled Level of assistance: Modified independence and SBA Comments: severe scoliosis.    FUNCTIONAL TESTS:  5 times sit to stand: 30.86 Timed up and go (TUG): 29.39 2 minute walk test: 283ft  10 meter walk test: 0.661m/s, SOB 4-5.  Berg Balance Scale: TBD  PATIENT SURVEYS:  FOTO 56. goal 33  TODAY'S TREATMENT 05/30/24  -forward high-knee marching in // bars, backward 6x each way  Rest break  -lateral side stepping in // bars 6x each way Rest break  -49ft AMB overground rollator, no LOB 58m39sec Rest break  -1.5lb AW bilat, 6 block stepover x20 (shower/tub entry simulation)  6 step staps alternating x20 (light UE support  only)  Rest break  -walking balance beam 2x4 -AMB to ADL kitchen  -normal stance, unsupported: sorting a deck of cards into 4 piles: no LOB, but increases back pain   05/28/24 -forward high-knee marching in // bars, backward 8x each way  -lateral side stepping in // bars 6x each way -overground AMB c 4WW 447ft, 44m28s (0.40m/s)  -sit to stand with BUE support x 12 reps -seated cable downward row MATRIX 7.5lb x12 -BUE GHJ ER c YTB resistance x15 -6 forward step up/down, turnaround x16   PATIENT EDUCATION: Education details: Pt educated throughout session about proper posture and technique with exercises. Improved exercise technique, movement at target joints, use of target muscles after min to mod verbal, visual, tactile cues. Person educated: Patient Education method: Explanation, demo, VC Education comprehension: verbalized understanding and returned demonstration  HOME EXERCISE PROGRAM: Access Code: [email or web address] URL: [email or web address] Date: 05/30/2024 Prepared by: Peggye Linear  Exercises - Side Stepping with Counter Support  - 2 sets - 30 reps - Heel Raises with Counter Support  - 3 sets - 15 reps - Standing March with Counter Support  - 2 sets - 30 reps - Standing Tandem Balance with Unilateral Counter Support  - 3 sets - 10 reps - 15 hold  Access Code: [email or web address] URL: [email or web address] Date: 09/25/2023 Prepared by: Massie Dollar  Exercises - Seated Hip Abduction with Resistance  - 1 x daily - 4 x weekly - 3 sets - 15 reps - 3 hold - Seated March with Resistance  - 1 x daily - 4 x weekly - 3 sets - 10 reps - 1 hold - Seated Long Arc Quad  - 1 x daily - 4 x weekly - 3 sets -  12 reps - 3 hold - Seated Hip Adduction Isometrics with Ball  - 1 x daily - 4 x weekly - 3 sets - 12 reps - 3 hold - Standing March with Counter Support  - 1 x daily - 3 x weekly - 2 sets - 10 reps - Standing Hip Abduction with Counter Support  - 1 x daily - 3 x weekly -  2 sets - 10 reps - Standing Hip Extension with Counter Support  - 1 x daily - 3 x weekly - 2 sets - 10 reps - Mini Squat with Counter Support  - 1 x daily - 3 x weekly - 2 sets - 10 reps - Standing Knee Flexion with Counter Support  - 1 x daily - 3 x weekly - 2 sets - 10 reps - Standing Tandem Balance with Counter Support  - 1 x daily - 3 x weekly - 2 sets - 4 reps - 10 hold  GOALS: Goals reviewed with patient? Yes  SHORT TERM GOALS: Target date: 12/28/23  Patient will be independent in home exercise program to improve strength/mobility for better functional independence with ADLs. Baseline:to be given at next session; 10/23/2023- Patient reports independence with HEP provided and no significant issues.   Goal status: MET 2. Pt will be able to ambulate for at least 5 min without stopping with LRAD to allow improved access to community and return to PLOF  Baseline: 3 minutes. 2/11: 6 minute walk test completed.   Goal Status: MET  . LONG TERM GOALS: Target date: 07/16/2024 Patient will increase FOTO score to equal to or greater than 61 to demonstrate statistically significant improvement in mobility and quality of life.  Baseline: 56; 10/23/2023= 60 Goal status: PROGRESSING/DISCONTINUED   2.  Patient (> 64 years old) will complete five times sit to stand test in < 15 seconds  without UE support indicating an increased LE strength and improved balance. Baseline: 30.86 sec; 10/23/2023= 22.08 using 1 UE support.  12/11: 14.67 sec BUE from arm rest. And 14.12 with 1 UE pushing from arm rest. 01/30/24 13 seconds with UE  3/26: 15.0  able to achieve full stand on each repetition  Goal status: MET  3.  Patient will increase Berg Balance score to >45 points to demonstrate decreased fall risk during functional activities  Baseline: 26; 10/23/2023= 30 12/11: 34 01/30/24: 41/56 3/26: 40/56 04/23/2024= 44/56 Goal status: Progressing  4.  Patient will increase 10 meter walk test to >1.80m/s as to  improve gait speed for better community ambulation and to reduce fall risk. Baseline: 0.619m/s; 10/23/2023= 0.88 m/s (normal speed with 4WW) 12/11: Average Fast speed: 1.05 m/s Average Normal speed: 0.834 m/s 01/30/24: 1.14 m/s 3/26: 1.0 m/s Goal status: MET  5.  Patient will reduce timed up and go to <11 seconds to reduce fall risk and demonstrate improved transfer/gait ability. Baseline: 29.39 12/2: 16.4 sec with Rollator  12/11: 15.19 sec with rollator  2/4: 17.38 sec with rollator  3/4: 15 sec with rollator  3/26: 13.76 sec with rollator  04/23/2024= 12.34 sec avg with SPC Goal status: IN PROGRESS  6.  Patient will 2 min walk test by at least 76ft as to demonstrate reduced fall risk and improved dynamic gait balance for better safety with community/home ambulation.  Baseline: 274ft; 10/23/2023= 360 feet  with 4WW 336ft with 4WW Goal status: MET  7. Pt will complete 6 min walk test without stopping and improve overall score by at least 161ft to indicate  increased access to community.  Baseline: to be complete 2/11:880 ft with RW; 01/30/24: 1168 ft with RW  3/26: 1134ft with Rollator  Goal: MET   ASSESSMENT: CLINICAL IMPRESSION:  LT goals remain generally in good shape, most of which are AM Boriented, however pt still wants to work more on her balance. BBT has conitnued to improve the whole time she's been here, still has great potential to improve. HEP updated to reflect more balance interventions for home. Added in cable rows for scapular strengthening last visit. Ms. Adger will continue to benefit from skilled physical therapy intervention to address stated impairments, improve QOL, decrease fall risk, and attain therapy goals.   OBJECTIVE IMPAIRMENTS: Abnormal gait, cardiopulmonary status limiting activity, decreased activity tolerance, decreased balance, decreased endurance, decreased mobility, difficulty walking, decreased ROM, decreased strength, decreased safety awareness,  hypomobility, increased fascial restrictions, impaired flexibility, improper body mechanics, and postural dysfunction.   ACTIVITY LIMITATIONS: lifting, standing, squatting, transfers, and locomotion level  PARTICIPATION LIMITATIONS: laundry, shopping, and community activity  PERSONAL FACTORS: 3+ comorbidities: scoliosis, hx of CA, and PE are also affecting patient's functional outcome.   REHAB POTENTIAL: Good  CLINICAL DECISION MAKING: Stable/uncomplicated  EVALUATION COMPLEXITY: Moderate  PLAN:  PT FREQUENCY: 1-2x/week  PT DURATION: 12 weeks  PLANNED INTERVENTIONS: Therapeutic exercises, Therapeutic activity, Neuromuscular re-education, Balance training, Gait training, Patient/Family education, Self Care, Joint mobilization, Stair training, Vestibular training, DME instructions, Dry Needling, Spinal mobilization, Cryotherapy, Moist heat, and Manual therapy  PLAN FOR NEXT SESSION: Continue Dynamic balance and gait training. Progressing to higher level variable direction stepping and reaching tasks. BLE and postural strengthening.  3:35 PM, 05/30/24 Peggye JAYSON Linear, PT, DPT Physical Therapist - [facility]  [phone] [facility])

## 2024-06-04 ENCOUNTER — Ambulatory Visit: Payer: Medicare Other | Admitting: Physical Therapy

## 2024-06-04 DIAGNOSIS — M6281 Muscle weakness (generalized): Secondary | ICD-10-CM

## 2024-06-04 DIAGNOSIS — R2681 Unsteadiness on feet: Secondary | ICD-10-CM

## 2024-06-04 DIAGNOSIS — R2689 Other abnormalities of gait and mobility: Secondary | ICD-10-CM

## 2024-06-04 DIAGNOSIS — R262 Difficulty in walking, not elsewhere classified: Secondary | ICD-10-CM

## 2024-06-04 NOTE — Therapy (Signed)
 OUTPATIENT PHYSICAL THERAPY TREATMENT   Patient Name: Kristina Rowe MRN: [id number] DOB:1930-01-29, 89 y.o., female Today's Date: 06/04/2024  PCP: Sherial Bail, MD  REFERRING PROVIDER:   Suzzane Charleston, PA-C   END OF SESSION:   PT End of Session - 06/04/24 1453     Visit Number 46    Number of Visits 59    Date for PT Re-Evaluation 07/16/24    Progress Note Due on Visit 50    PT Start Time 0250    PT Stop Time 0329    PT Time Calculation (min) 39 min    Equipment Utilized During Treatment Gait belt    Activity Tolerance Patient tolerated treatment well;No increased pain    Behavior During Therapy WFL for tasks assessed/performed          Past Medical History:  Diagnosis Date   Ductal carcinoma in situ (DCIS) of left breast    Dysphagia    Esophageal stricture    Gastroparesis    GERD (gastroesophageal reflux disease)    Hiatal hernia    Hypertension    Iron deficiency anemia    Osteoporosis    Pulmonary embolism (HCC)    Scoliosis    UTI (urinary tract infection)    Past Surgical History:  Procedure Laterality Date   BREAST LUMPECTOMY Left 2009   Ductal Carcinoma insitu    CATARACT EXTRACTION, BILATERAL     COLONOSCOPY N/A 03/17/2021   Procedure: COLONOSCOPY;  Surgeon: Maryruth Ole DASEN, MD;  Location: [facility];  Service: Endoscopy;  Laterality: N/A;   COLOSTOMY REVISION Right 03/18/2021   Procedure: COLON RESECTION RIGHT;  Surgeon: Desiderio Schanz, MD;  Location: [facility];  Service: General;  Laterality: Right;   LAPAROSCOPIC PARAESOPHAGEAL HERNIA REPAIR  2009   LAPAROTOMY N/A 03/10/2021   Procedure: EXPLORATORY LAPAROTOMY;  Surgeon: Desiderio Schanz, MD;  Location: [facility];  Service: General;  Laterality: N/A;   TUBAL LIGATION     Patient Active Problem List   Diagnosis Date Noted   Acute on chronic diastolic CHF (congestive heart failure) (HCC) 08/19/2023   Acute respiratory failure with hypoxia (HCC) 08/17/2023   History of pulmonary embolus  (PE) 08/17/2023   Basal cell carcinoma of leg 04/01/2021   Coronary atherosclerosis 04/01/2021   History of pulmonary embolism 04/01/2021   Pulmonary nodule 04/01/2021   Malignant neoplasm of colon (HCC) 03/29/2021   Rectal bleeding 03/15/2021   Hypertension    GERD (gastroesophageal reflux disease)    Colonic mass    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HCC) 03/10/2021   Anemia, unspecified 03/06/2021   Bronchiectasis without complication (HCC) 03/27/2020   Iron deficiency anemia 03/27/2020   Moderate aortic stenosis 03/27/2020   Stage 3a chronic kidney disease (HCC) 07/04/2019   Hypnic headache 05/28/2019   Telogen effluvium 04/17/2019   Xerosis cutis 04/17/2019   Essential hypertension 01/14/2019   Cervical radiculopathy 05/04/2018   Osteoarthritis of left glenohumeral joint 05/04/2018   Vitamin B12 deficiency 01/01/2018   Vascular abnormality 09/11/2017   Dyspepsia 03/28/2017   Pulmonary embolism (HCC) 08/16/2016   Dilated pore of [place] 03/23/2015   Retinal drusen of both eyes 08/28/2014   Status post right cataract extraction 07/30/2014   Verruca 03/19/2014   Chronic back pain 11/06/2013   Postmenopausal osteoporosis 06/11/2013   Anticoagulated on Coumadin  04/06/2013   Long term (current) use of anticoagulants 10/18/2012   Preglaucoma 05/10/2012   Presbyopia 05/10/2012   Senile nuclear sclerosis 05/10/2012   Lobular carcinoma in situ of breast 04/13/2012   Closed  fracture of ankle 02/27/2012   History of nonmelanoma skin cancer 09/05/2011   Other benign neoplasm of skin of trunk 09/05/2011   Osteoporosis 08/25/2011   Transient alteration of awareness 09/03/2009   Colon adenoma 07/30/2002    ONSET DATE: 3 months   REFERRING DIAG:  Diagnosis  R53.83 (ICD-10-CM) - Other fatigue    THERAPY DIAG:  No diagnosis found.  Rationale for Evaluation and Treatment: Rehabilitation  SUBJECTIVE:                                                                                                                                                                                              SUBJECTIVE STATEMENT:   Pt doing well. Is tired following long wait earlier and still not being able to see her dentist. No falls of LOB noted.   Pt accompanied by: self  PERTINENT HISTORY:  From recent MD visit.  89 y.o. here for an acute issue. She has a PMH of chronic PE/Coumadin , anemia, colon cancer, breast cancer, large hiatal hernia, moderate aortic stenosis who has concerns about shortness of breath, nausea, generalized weakness. No chest pain. History of hiatal hernia and recent CT chest. Also recent endoscopy. Takes Zofran  chronically. Has felt weaker and almost fell recently. Uses a walker. Also on chronic UTI suppression, Keflex .   PAIN:  Are you having pain?  Yes, typical lef tlow back pain 8/10  PRECAUTIONS: Fall  RED FLAGS: None   WEIGHT BEARING RESTRICTIONS: No  FALLS: Has patient fallen in last 6 months? No  LIVING ENVIRONMENT: Lives with: lives alone Lives in: House/apartment Stairs: Yes: External: 1 steps; none Has following equipment at home: Walker - 4 wheeled  PLOF: Independent with household mobility with device and Independent with community mobility with device  PATIENT GOALS: improved strength in BLE   OBJECTIVE:   DIAGNOSTIC FINDINGS:  06/12/2023 EXAM: CT CHEST, ABDOMEN, AND PELVIS WITH CONTRAST IMPRESSION: 1. Status post right hemicolectomy, without recurrent or metastatic disease. Decreased sensitivity exam, primarily involving the abdomen pelvis, due to paucity of fat. 2. New tiny left groin hernia containing fluid. Consider physical exam correlation. 3. Incidental findings, including: Coronary artery atherosclerosis. Aortic Atherosclerosis (ICD10-I70.0). Dilated esophagus with contrast throughout, suggesting dysmotility and/or gastroesophageal reflux.  COGNITION: Overall cognitive status: Within functional limits for tasks  assessed   SENSATION: Light touch: Impaired  mild paraesthesia in the proximal RLE   COORDINATION: WFL. Ankle to knee limited ROM due to weakness in the R hip      POSTURE: rounded shoulders, forward head, flexed trunk , and severe scoliosis     LOWER EXTREMITY MMT:  MMT Right Eval Left Eval  Hip flexion 4- 4  Hip extension    Hip abduction 4- 44  Hip adduction 4 4  Hip internal rotation    Hip external rotation    Knee flexion 4- 4  Knee extension 4 4+  Ankle dorsiflexion 4 4+  Ankle plantarflexion    Ankle inversion    Ankle eversion    (Blank rows = not tested)    TRANSFERS: Assistive device utilized: Environmental consultant - 4 wheeled  Sit to stand: SBA Stand to sit: SBA Chair to chair: SBA Floor: Unable to perform    STAIRS: Level of Assistance: pt declines Stair Negotiation Technique: Step to Pattern with Bilateral Rails Number of Stairs: 4  Height of Stairs: 6  Comments: will need to assess.   GAIT: Gait pattern: step through pattern, Right foot flat, and lateral hip instability Distance walked: 259ft Assistive device utilized: Walker - 4 wheeled Level of assistance: Modified independence and SBA Comments: severe scoliosis.    FUNCTIONAL TESTS:  5 times sit to stand: 30.86 Timed up and go (TUG): 29.39 2 minute walk test: 232ft  10 meter walk test: 0.644m/s, SOB 4-5.  Berg Balance Scale: TBD  PATIENT SURVEYS:  FOTO 56. goal 64  TODAY'S TREATMENT 06/04/24   TA Gait with rollator x 500 ft with RW  Forward gait no AD then retro gait no AD x 2 rounds of 20 ft, cues for posture with retro gait   Lateral stepping at support x 6 laps x 2 sets no UE assist   TE Seated hip extension with Black TB 2 x 10 reps ea LE   NMR Adducted stance playing letter organizing game x 5 min,   TA Step on and over 4 inch step x 10 ea LE with UE support   Step taps to 4 in step x 10 ea LE   Pt required occasional rest breaks due fatigue, PT was attentive to when  pt appeared to be tired or winded in order to prevent excessive fatigue.    PATIENT EDUCATION: Education details: Pt educated throughout session about proper posture and technique with exercises. Improved exercise technique, movement at target joints, use of target muscles after min to mod verbal, visual, tactile cues. Person educated: Patient Education method: Explanation, demo, VC Education comprehension: verbalized understanding and returned demonstration  HOME EXERCISE PROGRAM: Access Code: [email or web address] URL: [email or web address] Date: 05/30/2024 Prepared by: Peggye Linear  Exercises - Side Stepping with Counter Support  - 2 sets - 30 reps - Heel Raises with Counter Support  - 3 sets - 15 reps - Standing March with Counter Support  - 2 sets - 30 reps - Standing Tandem Balance with Unilateral Counter Support  - 3 sets - 10 reps - 15 hold  Access Code: [email or web address] URL: [email or web address] Date: 09/25/2023 Prepared by: Massie Dollar  Exercises - Seated Hip Abduction with Resistance  - 1 x daily - 4 x weekly - 3 sets - 15 reps - 3 hold - Seated March with Resistance  - 1 x daily - 4 x weekly - 3 sets - 10 reps - 1 hold - Seated Long Arc Quad  - 1 x daily - 4 x weekly - 3 sets - 12 reps - 3 hold - Seated Hip Adduction Isometrics with Ball  - 1 x daily - 4 x weekly - 3 sets - 12 reps - 3 hold - Standing March with Counter Support  - 1 x daily - 3 x weekly -  2 sets - 10 reps - Standing Hip Abduction with Counter Support  - 1 x daily - 3 x weekly - 2 sets - 10 reps - Standing Hip Extension with Counter Support  - 1 x daily - 3 x weekly - 2 sets - 10 reps - Mini Squat with Counter Support  - 1 x daily - 3 x weekly - 2 sets - 10 reps - Standing Knee Flexion with Counter Support  - 1 x daily - 3 x weekly - 2 sets - 10 reps - Standing Tandem Balance with Counter Support  - 1 x daily - 3 x weekly - 2 sets - 4 reps - 10 hold  GOALS: Goals reviewed with patient?  Yes  SHORT TERM GOALS: Target date: 12/28/23  Patient will be independent in home exercise program to improve strength/mobility for better functional independence with ADLs. Baseline:to be given at next session; 10/23/2023- Patient reports independence with HEP provided and no significant issues.   Goal status: MET 2. Pt will be able to ambulate for at least 5 min without stopping with LRAD to allow improved access to community and return to PLOF  Baseline: 3 minutes. 2/11: 6 minute walk test completed.   Goal Status: MET  . LONG TERM GOALS: Target date: 07/16/2024 Patient will increase FOTO score to equal to or greater than 61 to demonstrate statistically significant improvement in mobility and quality of life.  Baseline: 56; 10/23/2023= 60 Goal status: PROGRESSING/DISCONTINUED   2.  Patient (> 55 years old) will complete five times sit to stand test in < 15 seconds  without UE support indicating an increased LE strength and improved balance. Baseline: 30.86 sec; 10/23/2023= 22.08 using 1 UE support.  12/11: 14.67 sec BUE from arm rest. And 14.12 with 1 UE pushing from arm rest. 01/30/24 13 seconds with UE  3/26: 15.0  able to achieve full stand on each repetition  Goal status: MET  3.  Patient will increase Berg Balance score to >45 points to demonstrate decreased fall risk during functional activities  Baseline: 26; 10/23/2023= 30 12/11: 34 01/30/24: 41/56 3/26: 40/56 04/23/2024= 44/56 Goal status: Progressing  4.  Patient will increase 10 meter walk test to >1.24m/s as to improve gait speed for better community ambulation and to reduce fall risk. Baseline: 0.664m/s; 10/23/2023= 0.88 m/s (normal speed with 4WW) 12/11: Average Fast speed: 1.05 m/s Average Normal speed: 0.834 m/s 01/30/24: 1.14 m/s 3/26: 1.0 m/s Goal status: MET  5.  Patient will reduce timed up and go to <11 seconds to reduce fall risk and demonstrate improved transfer/gait ability. Baseline: 29.39 12/2: 16.4 sec with  Rollator  12/11: 15.19 sec with rollator  2/4: 17.38 sec with rollator  3/4: 15 sec with rollator  3/26: 13.76 sec with rollator  04/23/2024= 12.34 sec avg with SPC Goal status: IN PROGRESS  6.  Patient will 2 min walk test by at least 12ft as to demonstrate reduced fall risk and improved dynamic gait balance for better safety with community/home ambulation.  Baseline: 287ft; 10/23/2023= 360 feet  with 4WW 380ft with 4WW Goal status: MET  7. Pt will complete 6 min walk test without stopping and improve overall score by at least 158ft to indicate increased access to community.  Baseline: to be complete 2/11:880 ft with RW; 01/30/24: 1168 ft with RW  3/26: 1166ft with Rollator  Goal: MET   ASSESSMENT: CLINICAL IMPRESSION:   Patient arrived with good motivation for completion of pt activities. Interventions focussed primarily  on functional balance interventions including retro walking, sidestepping and adducted stance with reaching for prolonged period. Ms. Cathell will continue to benefit from skilled physical therapy intervention to address stated impairments, improve QOL, decrease fall risk, and attain therapy goals.   OBJECTIVE IMPAIRMENTS: Abnormal gait, cardiopulmonary status limiting activity, decreased activity tolerance, decreased balance, decreased endurance, decreased mobility, difficulty walking, decreased ROM, decreased strength, decreased safety awareness, hypomobility, increased fascial restrictions, impaired flexibility, improper body mechanics, and postural dysfunction.   ACTIVITY LIMITATIONS: lifting, standing, squatting, transfers, and locomotion level  PARTICIPATION LIMITATIONS: laundry, shopping, and community activity  PERSONAL FACTORS: 3+ comorbidities: scoliosis, hx of CA, and PE are also affecting patient's functional outcome.   REHAB POTENTIAL: Good  CLINICAL DECISION MAKING: Stable/uncomplicated  EVALUATION COMPLEXITY: Moderate  PLAN:  PT FREQUENCY:  1-2x/week  PT DURATION: 12 weeks  PLANNED INTERVENTIONS: Therapeutic exercises, Therapeutic activity, Neuromuscular re-education, Balance training, Gait training, Patient/Family education, Self Care, Joint mobilization, Stair training, Vestibular training, DME instructions, Dry Needling, Spinal mobilization, Cryotherapy, Moist heat, and Manual therapy  PLAN FOR NEXT SESSION: Continue Dynamic balance and gait training. Progressing to higher level variable direction stepping and reaching tasks. BLE and postural strengthening.  2:57 PM, 06/04/24 Note: Portions of this document were prepared using Dragon voice recognition software and although reviewed may contain unintentional dictation errors in syntax, grammar, or spelling.  Lonni KATHEE Gainer PT ,DPT Physical Therapist- [place]  [facility]

## 2024-06-05 ENCOUNTER — Inpatient Hospital Stay: Attending: Oncology

## 2024-06-05 DIAGNOSIS — E538 Deficiency of other specified B group vitamins: Secondary | ICD-10-CM | POA: Diagnosis present

## 2024-06-05 DIAGNOSIS — D508 Other iron deficiency anemias: Secondary | ICD-10-CM

## 2024-06-05 MED ORDER — CYANOCOBALAMIN 1000 MCG/ML IJ SOLN
1000.0000 ug | INTRAMUSCULAR | Status: DC
  Administered 2024-06-05: 1000 ug via INTRAMUSCULAR
  Filled 2024-06-05: qty 1

## 2024-06-06 ENCOUNTER — Ambulatory Visit: Payer: Medicare Other

## 2024-06-06 NOTE — Therapy (Incomplete)
 OUTPATIENT PHYSICAL THERAPY TREATMENT   Patient Name: Kristina Rowe MRN: [id number] DOB:September 29, 1930, 89 y.o., female Today's Date: 06/06/2024  PCP: Sherial Bail, MD  REFERRING PROVIDER:   Suzzane Charleston, PA-C   END OF SESSION:     Past Medical History:  Diagnosis Date   Ductal carcinoma in situ (DCIS) of left breast    Dysphagia    Esophageal stricture    Gastroparesis    GERD (gastroesophageal reflux disease)    Hiatal hernia    Hypertension    Iron deficiency anemia    Osteoporosis    Pulmonary embolism (HCC)    Scoliosis    UTI (urinary tract infection)    Past Surgical History:  Procedure Laterality Date   BREAST LUMPECTOMY Left 2009   Ductal Carcinoma insitu    CATARACT EXTRACTION, BILATERAL     COLONOSCOPY N/A 03/17/2021   Procedure: COLONOSCOPY;  Surgeon: Maryruth Ole DASEN, MD;  Location: [facility];  Service: Endoscopy;  Laterality: N/A;   COLOSTOMY REVISION Right 03/18/2021   Procedure: COLON RESECTION RIGHT;  Surgeon: Desiderio Schanz, MD;  Location: [facility];  Service: General;  Laterality: Right;   LAPAROSCOPIC PARAESOPHAGEAL HERNIA REPAIR  2009   LAPAROTOMY N/A 03/10/2021   Procedure: EXPLORATORY LAPAROTOMY;  Surgeon: Desiderio Schanz, MD;  Location: [facility];  Service: General;  Laterality: N/A;   TUBAL LIGATION     Patient Active Problem List   Diagnosis Date Noted   Acute on chronic diastolic CHF (congestive heart failure) (HCC) 08/19/2023   Acute respiratory failure with hypoxia (HCC) 08/17/2023   History of pulmonary embolus (PE) 08/17/2023   Basal cell carcinoma of leg 04/01/2021   Coronary atherosclerosis 04/01/2021   History of pulmonary embolism 04/01/2021   Pulmonary nodule 04/01/2021   Malignant neoplasm of colon (HCC) 03/29/2021   Rectal bleeding 03/15/2021   Hypertension    GERD (gastroesophageal reflux disease)    Colonic mass    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HCC) 03/10/2021   Anemia, unspecified 03/06/2021   Bronchiectasis without  complication (HCC) 03/27/2020   Iron deficiency anemia 03/27/2020   Moderate aortic stenosis 03/27/2020   Stage 3a chronic kidney disease (HCC) 07/04/2019   Hypnic headache 05/28/2019   Telogen effluvium 04/17/2019   Xerosis cutis 04/17/2019   Essential hypertension 01/14/2019   Cervical radiculopathy 05/04/2018   Osteoarthritis of left glenohumeral joint 05/04/2018   Vitamin B12 deficiency 01/01/2018   Vascular abnormality 09/11/2017   Dyspepsia 03/28/2017   Pulmonary embolism (HCC) 08/16/2016   Dilated pore of [place] 03/23/2015   Retinal drusen of both eyes 08/28/2014   Status post right cataract extraction 07/30/2014   Verruca 03/19/2014   Chronic back pain 11/06/2013   Postmenopausal osteoporosis 06/11/2013   Anticoagulated on Coumadin  04/06/2013   Long term (current) use of anticoagulants 10/18/2012   Preglaucoma 05/10/2012   Presbyopia 05/10/2012   Senile nuclear sclerosis 05/10/2012   Lobular carcinoma in situ of breast 04/13/2012   Closed fracture of ankle 02/27/2012   History of nonmelanoma skin cancer 09/05/2011   Other benign neoplasm of skin of trunk 09/05/2011   Osteoporosis 08/25/2011   Transient alteration of awareness 09/03/2009   Colon adenoma 07/30/2002    ONSET DATE: 3 months   REFERRING DIAG:  Diagnosis  R53.83 (ICD-10-CM) - Other fatigue    THERAPY DIAG:  No diagnosis found.  Rationale for Evaluation and Treatment: Rehabilitation  SUBJECTIVE:  SUBJECTIVE STATEMENT:   Pt doing well. Is tired following long wait earlier and still not being able to see her dentist. No falls of LOB noted.   Pt accompanied by: self  PERTINENT HISTORY:  From recent MD visit.  89 y.o. here for an acute issue. She has a PMH of chronic PE/Coumadin , anemia, colon cancer, breast  cancer, large hiatal hernia, moderate aortic stenosis who has concerns about shortness of breath, nausea, generalized weakness. No chest pain. History of hiatal hernia and recent CT chest. Also recent endoscopy. Takes Zofran  chronically. Has felt weaker and almost fell recently. Uses a walker. Also on chronic UTI suppression, Keflex .   PAIN:  Are you having pain?  Yes, typical lef tlow back pain 8/10  PRECAUTIONS: Fall  RED FLAGS: None   WEIGHT BEARING RESTRICTIONS: No  FALLS: Has patient fallen in last 6 months? No  LIVING ENVIRONMENT: Lives with: lives alone Lives in: House/apartment Stairs: Yes: External: 1 steps; none Has following equipment at home: Walker - 4 wheeled  PLOF: Independent with household mobility with device and Independent with community mobility with device  PATIENT GOALS: improved strength in BLE   OBJECTIVE:   DIAGNOSTIC FINDINGS:  06/12/2023 EXAM: CT CHEST, ABDOMEN, AND PELVIS WITH CONTRAST IMPRESSION: 1. Status post right hemicolectomy, without recurrent or metastatic disease. Decreased sensitivity exam, primarily involving the abdomen pelvis, due to paucity of fat. 2. New tiny left groin hernia containing fluid. Consider physical exam correlation. 3. Incidental findings, including: Coronary artery atherosclerosis. Aortic Atherosclerosis (ICD10-I70.0). Dilated esophagus with contrast throughout, suggesting dysmotility and/or gastroesophageal reflux.  COGNITION: Overall cognitive status: Within functional limits for tasks assessed   SENSATION: Light touch: Impaired  mild paraesthesia in the proximal RLE   COORDINATION: WFL. Ankle to knee limited ROM due to weakness in the R hip      POSTURE: rounded shoulders, forward head, flexed trunk , and severe scoliosis     LOWER EXTREMITY MMT:    MMT Right Eval Left Eval  Hip flexion 4- 4  Hip extension    Hip abduction 4- 44  Hip adduction 4 4  Hip internal rotation    Hip external  rotation    Knee flexion 4- 4  Knee extension 4 4+  Ankle dorsiflexion 4 4+  Ankle plantarflexion    Ankle inversion    Ankle eversion    (Blank rows = not tested)    TRANSFERS: Assistive device utilized: Environmental consultant - 4 wheeled  Sit to stand: SBA Stand to sit: SBA Chair to chair: SBA Floor: Unable to perform    STAIRS: Level of Assistance: pt declines Stair Negotiation Technique: Step to Pattern with Bilateral Rails Number of Stairs: 4  Height of Stairs: 6  Comments: will need to assess.   GAIT: Gait pattern: step through pattern, Right foot flat, and lateral hip instability Distance walked: 256ft Assistive device utilized: Walker - 4 wheeled Level of assistance: Modified independence and SBA Comments: severe scoliosis.    FUNCTIONAL TESTS:  5 times sit to stand: 30.86 Timed up and go (TUG): 29.39 2 minute walk test: 213ft  10 meter walk test: 0.662m/s, SOB 4-5.  Berg Balance Scale: TBD  PATIENT SURVEYS:  FOTO 56. goal 64  TODAY'S TREATMENT 06/06/24   TA Gait with rollator x 500 ft with RW  Forward gait no AD then retro gait no AD x 2 rounds of 20 ft, cues for posture with retro gait   Lateral stepping at support x 6 laps x 2 sets no  UE assist   TE Seated hip extension with Black TB 2 x 10 reps ea LE   NMR Adducted stance playing letter organizing game x 5 min,   TA Step on and over 4 inch step x 10 ea LE with UE support   Step taps to 4 in step x 10 ea LE   Pt required occasional rest breaks due fatigue, PT was attentive to when pt appeared to be tired or winded in order to prevent excessive fatigue.    PATIENT EDUCATION: Education details: Pt educated throughout session about proper posture and technique with exercises. Improved exercise technique, movement at target joints, use of target muscles after min to mod verbal, visual, tactile cues. Person educated: Patient Education method: Explanation, demo, VC Education comprehension: verbalized  understanding and returned demonstration  HOME EXERCISE PROGRAM: Access Code: [email or web address] URL: [email or web address] Date: 05/30/2024 Prepared by: Peggye Linear  Exercises - Side Stepping with Counter Support  - 2 sets - 30 reps - Heel Raises with Counter Support  - 3 sets - 15 reps - Standing March with Counter Support  - 2 sets - 30 reps - Standing Tandem Balance with Unilateral Counter Support  - 3 sets - 10 reps - 15 hold  Access Code: [email or web address] URL: [email or web address] Date: 09/25/2023 Prepared by: Massie Dollar  Exercises - Seated Hip Abduction with Resistance  - 1 x daily - 4 x weekly - 3 sets - 15 reps - 3 hold - Seated March with Resistance  - 1 x daily - 4 x weekly - 3 sets - 10 reps - 1 hold - Seated Long Arc Quad  - 1 x daily - 4 x weekly - 3 sets - 12 reps - 3 hold - Seated Hip Adduction Isometrics with Ball  - 1 x daily - 4 x weekly - 3 sets - 12 reps - 3 hold - Standing March with Counter Support  - 1 x daily - 3 x weekly - 2 sets - 10 reps - Standing Hip Abduction with Counter Support  - 1 x daily - 3 x weekly - 2 sets - 10 reps - Standing Hip Extension with Counter Support  - 1 x daily - 3 x weekly - 2 sets - 10 reps - Mini Squat with Counter Support  - 1 x daily - 3 x weekly - 2 sets - 10 reps - Standing Knee Flexion with Counter Support  - 1 x daily - 3 x weekly - 2 sets - 10 reps - Standing Tandem Balance with Counter Support  - 1 x daily - 3 x weekly - 2 sets - 4 reps - 10 hold  GOALS: Goals reviewed with patient? Yes  SHORT TERM GOALS: Target date: 12/28/23  Patient will be independent in home exercise program to improve strength/mobility for better functional independence with ADLs. Baseline:to be given at next session; 10/23/2023- Patient reports independence with HEP provided and no significant issues.   Goal status: MET 2. Pt will be able to ambulate for at least 5 min without stopping with LRAD to allow improved access to  community and return to PLOF  Baseline: 3 minutes. 2/11: 6 minute walk test completed.   Goal Status: MET  . LONG TERM GOALS: Target date: 07/16/2024 Patient will increase FOTO score to equal to or greater than 61 to demonstrate statistically significant improvement in mobility and quality of life.  Baseline: 56; 10/23/2023= 60 Goal status: PROGRESSING/DISCONTINUED   2.  Patient (> 51 years old) will complete  five times sit to stand test in < 15 seconds  without UE support indicating an increased LE strength and improved balance. Baseline: 30.86 sec; 10/23/2023= 22.08 using 1 UE support.  12/11: 14.67 sec BUE from arm rest. And 14.12 with 1 UE pushing from arm rest. 01/30/24 13 seconds with UE  3/26: 15.0  able to achieve full stand on each repetition  Goal status: MET  3.  Patient will increase Berg Balance score to >45 points to demonstrate decreased fall risk during functional activities  Baseline: 26; 10/23/2023= 30 12/11: 34 01/30/24: 41/56 3/26: 40/56 04/23/2024= 44/56 Goal status: Progressing  4.  Patient will increase 10 meter walk test to >1.54m/s as to improve gait speed for better community ambulation and to reduce fall risk. Baseline: 0.633m/s; 10/23/2023= 0.88 m/s (normal speed with 4WW) 12/11: Average Fast speed: 1.05 m/s Average Normal speed: 0.834 m/s 01/30/24: 1.14 m/s 3/26: 1.0 m/s Goal status: MET  5.  Patient will reduce timed up and go to <11 seconds to reduce fall risk and demonstrate improved transfer/gait ability. Baseline: 29.39 12/2: 16.4 sec with Rollator  12/11: 15.19 sec with rollator  2/4: 17.38 sec with rollator  3/4: 15 sec with rollator  3/26: 13.76 sec with rollator  04/23/2024= 12.34 sec avg with SPC Goal status: IN PROGRESS  6.  Patient will 2 min walk test by at least 9ft as to demonstrate reduced fall risk and improved dynamic gait balance for better safety with community/home ambulation.  Baseline: 264ft; 10/23/2023= 360 feet  with 4WW 343ft  with 4WW Goal status: MET  7. Pt will complete 6 min walk test without stopping and improve overall score by at least 177ft to indicate increased access to community.  Baseline: to be complete 2/11:880 ft with RW; 01/30/24: 1168 ft with RW  3/26: 1122ft with Rollator  Goal: MET   ASSESSMENT: CLINICAL IMPRESSION:   Patient arrived with good motivation for completion of pt activities. Interventions focussed primarily on functional balance interventions including retro walking, sidestepping and adducted stance with reaching for prolonged period. Ms. Seelig will continue to benefit from skilled physical therapy intervention to address stated impairments, improve QOL, decrease fall risk, and attain therapy goals.   OBJECTIVE IMPAIRMENTS: Abnormal gait, cardiopulmonary status limiting activity, decreased activity tolerance, decreased balance, decreased endurance, decreased mobility, difficulty walking, decreased ROM, decreased strength, decreased safety awareness, hypomobility, increased fascial restrictions, impaired flexibility, improper body mechanics, and postural dysfunction.   ACTIVITY LIMITATIONS: lifting, standing, squatting, transfers, and locomotion level  PARTICIPATION LIMITATIONS: laundry, shopping, and community activity  PERSONAL FACTORS: 3+ comorbidities: scoliosis, hx of CA, and PE are also affecting patient's functional outcome.   REHAB POTENTIAL: Good  CLINICAL DECISION MAKING: Stable/uncomplicated  EVALUATION COMPLEXITY: Moderate  PLAN:  PT FREQUENCY: 1-2x/week  PT DURATION: 12 weeks  PLANNED INTERVENTIONS: Therapeutic exercises, Therapeutic activity, Neuromuscular re-education, Balance training, Gait training, Patient/Family education, Self Care, Joint mobilization, Stair training, Vestibular training, DME instructions, Dry Needling, Spinal mobilization, Cryotherapy, Moist heat, and Manual therapy  PLAN FOR NEXT SESSION: Continue Dynamic balance and gait training.  Progressing to higher level variable direction stepping and reaching tasks. BLE and postural strengthening.  1:51 PM, 06/06/24   Reyes LOISE London PT  Physical Therapist- [place]  [facility]

## 2024-06-07 ENCOUNTER — Inpatient Hospital Stay: Payer: Medicare Other

## 2024-06-11 ENCOUNTER — Ambulatory Visit

## 2024-06-11 ENCOUNTER — Ambulatory Visit: Payer: Medicare Other

## 2024-06-11 DIAGNOSIS — R2681 Unsteadiness on feet: Secondary | ICD-10-CM

## 2024-06-11 DIAGNOSIS — R2689 Other abnormalities of gait and mobility: Secondary | ICD-10-CM

## 2024-06-11 DIAGNOSIS — R278 Other lack of coordination: Secondary | ICD-10-CM

## 2024-06-11 DIAGNOSIS — M6281 Muscle weakness (generalized): Secondary | ICD-10-CM | POA: Diagnosis not present

## 2024-06-11 DIAGNOSIS — M5459 Other low back pain: Secondary | ICD-10-CM

## 2024-06-11 DIAGNOSIS — R262 Difficulty in walking, not elsewhere classified: Secondary | ICD-10-CM

## 2024-06-11 NOTE — Therapy (Signed)
 OUTPATIENT PHYSICAL THERAPY TREATMENT   Patient Name: Kristina Rowe MRN: [id number] DOB:08-17-1930, 89 y.o., female Today's Date: 06/11/2024  PCP: Sherial Bail, MD  REFERRING PROVIDER:   Suzzane Charleston, PA-C   END OF SESSION:   PT End of Session - 06/11/24 1506     Visit Number 47    Number of Visits 59    Date for PT Re-Evaluation 07/16/24    Progress Note Due on Visit 50    PT Start Time 1506    PT Stop Time 1536    PT Time Calculation (min) 30 min    Equipment Utilized During Treatment Gait belt    Activity Tolerance Patient tolerated treatment well;No increased pain    Behavior During Therapy WFL for tasks assessed/performed           Past Medical History:  Diagnosis Date   Ductal carcinoma in situ (DCIS) of left breast    Dysphagia    Esophageal stricture    Gastroparesis    GERD (gastroesophageal reflux disease)    Hiatal hernia    Hypertension    Iron deficiency anemia    Osteoporosis    Pulmonary embolism (HCC)    Scoliosis    UTI (urinary tract infection)    Past Surgical History:  Procedure Laterality Date   BREAST LUMPECTOMY Left 2009   Ductal Carcinoma insitu    CATARACT EXTRACTION, BILATERAL     COLONOSCOPY N/A 03/17/2021   Procedure: COLONOSCOPY;  Surgeon: Maryruth Ole DASEN, MD;  Location: [facility];  Service: Endoscopy;  Laterality: N/A;   COLOSTOMY REVISION Right 03/18/2021   Procedure: COLON RESECTION RIGHT;  Surgeon: Desiderio Schanz, MD;  Location: [facility];  Service: General;  Laterality: Right;   LAPAROSCOPIC PARAESOPHAGEAL HERNIA REPAIR  2009   LAPAROTOMY N/A 03/10/2021   Procedure: EXPLORATORY LAPAROTOMY;  Surgeon: Desiderio Schanz, MD;  Location: [facility];  Service: General;  Laterality: N/A;   TUBAL LIGATION     Patient Active Problem List   Diagnosis Date Noted   Acute on chronic diastolic CHF (congestive heart failure) (HCC) 08/19/2023   Acute respiratory failure with hypoxia (HCC) 08/17/2023   History of pulmonary embolus  (PE) 08/17/2023   Basal cell carcinoma of leg 04/01/2021   Coronary atherosclerosis 04/01/2021   History of pulmonary embolism 04/01/2021   Pulmonary nodule 04/01/2021   Malignant neoplasm of colon (HCC) 03/29/2021   Rectal bleeding 03/15/2021   Hypertension    GERD (gastroesophageal reflux disease)    Colonic mass    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HCC) 03/10/2021   Anemia, unspecified 03/06/2021   Bronchiectasis without complication (HCC) 03/27/2020   Iron deficiency anemia 03/27/2020   Moderate aortic stenosis 03/27/2020   Stage 3a chronic kidney disease (HCC) 07/04/2019   Hypnic headache 05/28/2019   Telogen effluvium 04/17/2019   Xerosis cutis 04/17/2019   Essential hypertension 01/14/2019   Cervical radiculopathy 05/04/2018   Osteoarthritis of left glenohumeral joint 05/04/2018   Vitamin B12 deficiency 01/01/2018   Vascular abnormality 09/11/2017   Dyspepsia 03/28/2017   Pulmonary embolism (HCC) 08/16/2016   Dilated pore of [place] 03/23/2015   Retinal drusen of both eyes 08/28/2014   Status post right cataract extraction 07/30/2014   Verruca 03/19/2014   Chronic back pain 11/06/2013   Postmenopausal osteoporosis 06/11/2013   Anticoagulated on Coumadin  04/06/2013   Long term (current) use of anticoagulants 10/18/2012   Preglaucoma 05/10/2012   Presbyopia 05/10/2012   Senile nuclear sclerosis 05/10/2012   Lobular carcinoma in situ of breast 04/13/2012  Closed fracture of ankle 02/27/2012   History of nonmelanoma skin cancer 09/05/2011   Other benign neoplasm of skin of trunk 09/05/2011   Osteoporosis 08/25/2011   Transient alteration of awareness 09/03/2009   Colon adenoma 07/30/2002    ONSET DATE: 3 months   REFERRING DIAG:  Diagnosis  R53.83 (ICD-10-CM) - Other fatigue    THERAPY DIAG:  Muscle weakness (generalized)  Difficulty in walking, not elsewhere classified  Unsteadiness on feet  Other abnormalities of gait and mobility  Other low back pain  Other  lack of coordination  Rationale for Evaluation and Treatment: Rehabilitation  SUBJECTIVE:                                                                                                                                                                                             SUBJECTIVE STATEMENT:   Patient reports doing okay but the weather makes her nervous so she initially canceled on her way then upon talking to her Dtr called clinic back to state she could come. She presents 15 min late but in good spirits.  Pt accompanied by: self  PERTINENT HISTORY:  From recent MD visit.  89 y.o. here for an acute issue. She has a PMH of chronic PE/Coumadin , anemia, colon cancer, breast cancer, large hiatal hernia, moderate aortic stenosis who has concerns about shortness of breath, nausea, generalized weakness. No chest pain. History of hiatal hernia and recent CT chest. Also recent endoscopy. Takes Zofran  chronically. Has felt weaker and almost fell recently. Uses a walker. Also on chronic UTI suppression, Keflex .   PAIN:  Are you having pain?  Yes, typical lef tlow back pain 8/10  PRECAUTIONS: Fall  RED FLAGS: None   WEIGHT BEARING RESTRICTIONS: No  FALLS: Has patient fallen in last 6 months? No  LIVING ENVIRONMENT: Lives with: lives alone Lives in: House/apartment Stairs: Yes: External: 1 steps; none Has following equipment at home: Walker - 4 wheeled  PLOF: Independent with household mobility with device and Independent with community mobility with device  PATIENT GOALS: improved strength in BLE   OBJECTIVE:   DIAGNOSTIC FINDINGS:  06/12/2023 EXAM: CT CHEST, ABDOMEN, AND PELVIS WITH CONTRAST IMPRESSION: 1. Status post right hemicolectomy, without recurrent or metastatic disease. Decreased sensitivity exam, primarily involving the abdomen pelvis, due to paucity of fat. 2. New tiny left groin hernia containing fluid. Consider physical exam correlation. 3. Incidental  findings, including: Coronary artery atherosclerosis. Aortic Atherosclerosis (ICD10-I70.0). Dilated esophagus with contrast throughout, suggesting dysmotility and/or gastroesophageal reflux.  COGNITION: Overall cognitive status: Within functional limits for tasks assessed   SENSATION: Light touch: Impaired  mild paraesthesia in  the proximal RLE   COORDINATION: WFL. Ankle to knee limited ROM due to weakness in the R hip      POSTURE: rounded shoulders, forward head, flexed trunk , and severe scoliosis     LOWER EXTREMITY MMT:    MMT Right Eval Left Eval  Hip flexion 4- 4  Hip extension    Hip abduction 4- 44  Hip adduction 4 4  Hip internal rotation    Hip external rotation    Knee flexion 4- 4  Knee extension 4 4+  Ankle dorsiflexion 4 4+  Ankle plantarflexion    Ankle inversion    Ankle eversion    (Blank rows = not tested)    TRANSFERS: Assistive device utilized: Environmental consultant - 4 wheeled  Sit to stand: SBA Stand to sit: SBA Chair to chair: SBA Floor: Unable to perform    STAIRS: Level of Assistance: pt declines Stair Negotiation Technique: Step to Pattern with Bilateral Rails Number of Stairs: 4  Height of Stairs: 6  Comments: will need to assess.   GAIT: Gait pattern: step through pattern, Right foot flat, and lateral hip instability Distance walked: 244ft Assistive device utilized: Walker - 4 wheeled Level of assistance: Modified independence and SBA Comments: severe scoliosis.    FUNCTIONAL TESTS:  5 times sit to stand: 30.86 Timed up and go (TUG): 29.39 2 minute walk test: 212ft  10 meter walk test: 0.658m/s, SOB 4-5.  Berg Balance Scale: TBD  PATIENT SURVEYS:  FOTO 56. goal 64  TODAY'S TREATMENT 06/11/24    TE Resisted standing hip extension with blue TB 2 x 10 reps ea LE   NMR:   FWD/BWD step up/over 1/2 foam x 15 reps Alt LE Side step up/over 1/2 foam roll x 15 reps alt LE Ambulation in hallway with horizontal and vertical head  motions without LOB x 200 feet.  Scap retraction with GTB 2 x 10 reps   TA:  Gait with rollator x 300 ft with RW Step up onto airex pad x 20 ea LE with UE support   Step taps to 4 in step x 10 ea LE   Sit to stand with minimal UE support x 5 reps   Pt required occasional rest breaks due fatigue, PT was attentive to when pt appeared to be tired or winded in order to prevent excessive fatigue.    PATIENT EDUCATION: Education details: Pt educated throughout session about proper posture and technique with exercises. Improved exercise technique, movement at target joints, use of target muscles after min to mod verbal, visual, tactile cues. Person educated: Patient Education method: Explanation, demo, VC Education comprehension: verbalized understanding and returned demonstration  HOME EXERCISE PROGRAM: Access Code: [email or web address] URL: [email or web address] Date: 05/30/2024 Prepared by: Peggye Linear  Exercises - Side Stepping with Counter Support  - 2 sets - 30 reps - Heel Raises with Counter Support  - 3 sets - 15 reps - Standing March with Counter Support  - 2 sets - 30 reps - Standing Tandem Balance with Unilateral Counter Support  - 3 sets - 10 reps - 15 hold  Access Code: [email or web address] URL: [email or web address] Date: 09/25/2023 Prepared by: Massie Dollar  Exercises - Seated Hip Abduction with Resistance  - 1 x daily - 4 x weekly - 3 sets - 15 reps - 3 hold - Seated March with Resistance  - 1 x daily - 4 x weekly - 3 sets - 10 reps - 1 hold - Seated Long Arc Quad  - 1 x  daily - 4 x weekly - 3 sets - 12 reps - 3 hold - Seated Hip Adduction Isometrics with Ball  - 1 x daily - 4 x weekly - 3 sets - 12 reps - 3 hold - Standing March with Counter Support  - 1 x daily - 3 x weekly - 2 sets - 10 reps - Standing Hip Abduction with Counter Support  - 1 x daily - 3 x weekly - 2 sets - 10 reps - Standing Hip Extension with Counter Support  - 1 x daily - 3 x weekly -  2 sets - 10 reps - Mini Squat with Counter Support  - 1 x daily - 3 x weekly - 2 sets - 10 reps - Standing Knee Flexion with Counter Support  - 1 x daily - 3 x weekly - 2 sets - 10 reps - Standing Tandem Balance with Counter Support  - 1 x daily - 3 x weekly - 2 sets - 4 reps - 10 hold  GOALS: Goals reviewed with patient? Yes  SHORT TERM GOALS: Target date: 12/28/23  Patient will be independent in home exercise program to improve strength/mobility for better functional independence with ADLs. Baseline:to be given at next session; 10/23/2023- Patient reports independence with HEP provided and no significant issues.   Goal status: MET 2. Pt will be able to ambulate for at least 5 min without stopping with LRAD to allow improved access to community and return to PLOF  Baseline: 3 minutes. 2/11: 6 minute walk test completed.   Goal Status: MET  . LONG TERM GOALS: Target date: 07/16/2024 Patient will increase FOTO score to equal to or greater than 61 to demonstrate statistically significant improvement in mobility and quality of life.  Baseline: 56; 10/23/2023= 60 Goal status: PROGRESSING/DISCONTINUED   2.  Patient (> 9 years old) will complete five times sit to stand test in < 15 seconds  without UE support indicating an increased LE strength and improved balance. Baseline: 30.86 sec; 10/23/2023= 22.08 using 1 UE support.  12/11: 14.67 sec BUE from arm rest. And 14.12 with 1 UE pushing from arm rest. 01/30/24 13 seconds with UE  3/26: 15.0  able to achieve full stand on each repetition  Goal status: MET  3.  Patient will increase Berg Balance score to >45 points to demonstrate decreased fall risk during functional activities  Baseline: 26; 10/23/2023= 30 12/11: 34 01/30/24: 41/56 3/26: 40/56 04/23/2024= 44/56 Goal status: Progressing  4.  Patient will increase 10 meter walk test to >1.69m/s as to improve gait speed for better community ambulation and to reduce fall risk. Baseline: 0.637m/s;  10/23/2023= 0.88 m/s (normal speed with 4WW) 12/11: Average Fast speed: 1.05 m/s Average Normal speed: 0.834 m/s 01/30/24: 1.14 m/s 3/26: 1.0 m/s Goal status: MET  5.  Patient will reduce timed up and go to <11 seconds to reduce fall risk and demonstrate improved transfer/gait ability. Baseline: 29.39 12/2: 16.4 sec with Rollator  12/11: 15.19 sec with rollator  2/4: 17.38 sec with rollator  3/4: 15 sec with rollator  3/26: 13.76 sec with rollator  04/23/2024= 12.34 sec avg with SPC Goal status: IN PROGRESS  6.  Patient will 2 min walk test by at least 32ft as to demonstrate reduced fall risk and improved dynamic gait balance for better safety with community/home ambulation.  Baseline: 264ft; 10/23/2023= 360 feet  with 4WW 34ft with 4WW Goal status: MET  7. Pt will complete 6 min walk test without stopping and  improve overall score by at least 16ft to indicate increased access to community.  Baseline: to be complete 2/11:880 ft with RW; 01/30/24: 1168 ft with RW  3/26: 1150ft with Rollator  Goal: MET   ASSESSMENT: CLINICAL IMPRESSION:   Treatment limited secondary to late arrival but patient arrived motivated and moving well- no LOB with balance activities and presented with good reciprocal step and ability to clear steps. Ms. Surber will continue to benefit from skilled physical therapy intervention to address stated impairments, improve QOL, decrease fall risk, and attain therapy goals.   OBJECTIVE IMPAIRMENTS: Abnormal gait, cardiopulmonary status limiting activity, decreased activity tolerance, decreased balance, decreased endurance, decreased mobility, difficulty walking, decreased ROM, decreased strength, decreased safety awareness, hypomobility, increased fascial restrictions, impaired flexibility, improper body mechanics, and postural dysfunction.   ACTIVITY LIMITATIONS: lifting, standing, squatting, transfers, and locomotion level  PARTICIPATION LIMITATIONS: laundry,  shopping, and community activity  PERSONAL FACTORS: 3+ comorbidities: scoliosis, hx of CA, and PE are also affecting patient's functional outcome.   REHAB POTENTIAL: Good  CLINICAL DECISION MAKING: Stable/uncomplicated  EVALUATION COMPLEXITY: Moderate  PLAN:  PT FREQUENCY: 1-2x/week  PT DURATION: 12 weeks  PLANNED INTERVENTIONS: Therapeutic exercises, Therapeutic activity, Neuromuscular re-education, Balance training, Gait training, Patient/Family education, Self Care, Joint mobilization, Stair training, Vestibular training, DME instructions, Dry Needling, Spinal mobilization, Cryotherapy, Moist heat, and Manual therapy  PLAN FOR NEXT SESSION: Continue Dynamic balance and gait training. Progressing to higher level variable direction stepping and reaching tasks. BLE and postural strengthening.  6:15 PM, 06/11/24   Reyes LOISE London PT  Physical Therapist- [place]  [facility]

## 2024-06-13 ENCOUNTER — Ambulatory Visit: Admitting: Physical Therapy

## 2024-06-13 DIAGNOSIS — M5459 Other low back pain: Secondary | ICD-10-CM

## 2024-06-13 DIAGNOSIS — R278 Other lack of coordination: Secondary | ICD-10-CM

## 2024-06-13 DIAGNOSIS — M6281 Muscle weakness (generalized): Secondary | ICD-10-CM

## 2024-06-13 DIAGNOSIS — R262 Difficulty in walking, not elsewhere classified: Secondary | ICD-10-CM

## 2024-06-13 DIAGNOSIS — R2681 Unsteadiness on feet: Secondary | ICD-10-CM

## 2024-06-13 DIAGNOSIS — R2689 Other abnormalities of gait and mobility: Secondary | ICD-10-CM

## 2024-06-13 NOTE — Therapy (Signed)
 OUTPATIENT PHYSICAL THERAPY TREATMENT   Patient Name: Kristina Rowe MRN: [id number] DOB:[birth year], 89 y.o., female Today's Date: 06/13/2024  PCP: Sherial Bail, MD  REFERRING PROVIDER:   Suzzane Charleston, PA-C   END OF SESSION:   PT End of Session - 06/13/24 1516     Visit Number 48    Number of Visits 59    Date for PT Re-Evaluation 07/16/24    Progress Note Due on Visit 50    PT Start Time 1521    PT Stop Time 1612    PT Time Calculation (min) 51 min    Equipment Utilized During Treatment Gait belt    Activity Tolerance Patient tolerated treatment well;No increased pain    Behavior During Therapy WFL for tasks assessed/performed            Past Medical History:  Diagnosis Date   Ductal carcinoma in situ (DCIS) of left breast    Dysphagia    Esophageal stricture    Gastroparesis    GERD (gastroesophageal reflux disease)    Hiatal hernia    Hypertension    Iron deficiency anemia    Osteoporosis    Pulmonary embolism (HCC)    Scoliosis    UTI (urinary tract infection)    Past Surgical History:  Procedure Laterality Date   BREAST LUMPECTOMY Left 2009   Ductal Carcinoma insitu    CATARACT EXTRACTION, BILATERAL     COLONOSCOPY N/A 03/17/2021   Procedure: COLONOSCOPY;  Surgeon: Maryruth Ole DASEN, MD;  Location: [facility];  Service: Endoscopy;  Laterality: N/A;   COLOSTOMY REVISION Right 03/18/2021   Procedure: COLON RESECTION RIGHT;  Surgeon: Desiderio Schanz, MD;  Location: [facility];  Service: General;  Laterality: Right;   LAPAROSCOPIC PARAESOPHAGEAL HERNIA REPAIR  2009   LAPAROTOMY N/A 03/10/2021   Procedure: EXPLORATORY LAPAROTOMY;  Surgeon: Desiderio Schanz, MD;  Location: [facility];  Service: General;  Laterality: N/A;   TUBAL LIGATION     Patient Active Problem List   Diagnosis Date Noted   Acute on chronic diastolic CHF (congestive heart failure) (HCC) 08/19/2023   Acute respiratory failure with hypoxia (HCC) 08/17/2023   History of pulmonary  embolus (PE) 08/17/2023   Basal cell carcinoma of leg 04/01/2021   Coronary atherosclerosis 04/01/2021   History of pulmonary embolism 04/01/2021   Pulmonary nodule 04/01/2021   Malignant neoplasm of colon (HCC) 03/29/2021   Rectal bleeding 03/15/2021   Hypertension    GERD (gastroesophageal reflux disease)    Colonic mass    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HCC) 03/10/2021   Anemia, unspecified 03/06/2021   Bronchiectasis without complication (HCC) 03/27/2020   Iron deficiency anemia 03/27/2020   Moderate aortic stenosis 03/27/2020   Stage 3a chronic kidney disease (HCC) 07/04/2019   Hypnic headache 05/28/2019   Telogen effluvium 04/17/2019   Xerosis cutis 04/17/2019   Essential hypertension 01/14/2019   Cervical radiculopathy 05/04/2018   Osteoarthritis of left glenohumeral joint 05/04/2018   Vitamin B12 deficiency 01/01/2018   Vascular abnormality 09/11/2017   Dyspepsia 03/28/2017   Pulmonary embolism (HCC) 08/16/2016   Dilated pore of [place] 03/23/2015   Retinal drusen of both eyes 08/28/2014   Status post right cataract extraction 07/30/2014   Verruca 03/19/2014   Chronic back pain 11/06/2013   Postmenopausal osteoporosis 06/11/2013   Anticoagulated on Coumadin  04/06/2013   Long term (current) use of anticoagulants 10/18/2012   Preglaucoma 05/10/2012   Presbyopia 05/10/2012   Senile nuclear sclerosis 05/10/2012   Lobular carcinoma in situ of breast 04/13/2012  Closed fracture of ankle 02/27/2012   History of nonmelanoma skin cancer 09/05/2011   Other benign neoplasm of skin of trunk 09/05/2011   Osteoporosis 08/25/2011   Transient alteration of awareness 09/03/2009   Colon adenoma 07/30/2002    ONSET DATE: 3 months   REFERRING DIAG:  Diagnosis  R53.83 (ICD-10-CM) - Other fatigue    THERAPY DIAG:  Muscle weakness (generalized)  Difficulty in walking, not elsewhere classified  Unsteadiness on feet  Other abnormalities of gait and mobility  Other low back  pain  Other lack of coordination  Rationale for Evaluation and Treatment: Rehabilitation  SUBJECTIVE:                                                                                                                                                                                             SUBJECTIVE STATEMENT:   Pt reports she didn't sleep well last night due to drinking caffeine late in the day. No complaints of pain to start session. Pt reports she uses her cane or her husbands walker (with a platform on top of it, which allows her to transport items) in the home.   Pt accompanied by: self  PERTINENT HISTORY:  From recent MD visit.  89 y.o. here for an acute issue. She has a PMH of chronic PE/Coumadin , anemia, colon cancer, breast cancer, large hiatal hernia, moderate aortic stenosis who has concerns about shortness of breath, nausea, generalized weakness. No chest pain. History of hiatal hernia and recent CT chest. Also recent endoscopy. Takes Zofran  chronically. Has felt weaker and almost fell recently. Uses a walker. Also on chronic UTI suppression, Keflex .   PAIN:  Are you having pain?  Yes, typical lef tlow back pain 8/10  PRECAUTIONS: Fall  RED FLAGS: None   WEIGHT BEARING RESTRICTIONS: No  FALLS: Has patient fallen in last 6 months? No  LIVING ENVIRONMENT: Lives with: lives alone Lives in: House/apartment Stairs: Yes: External: 1 steps; none Has following equipment at home: Walker - 4 wheeled  PLOF: Independent with household mobility with device and Independent with community mobility with device  PATIENT GOALS: improved strength in BLE   OBJECTIVE:   DIAGNOSTIC FINDINGS:  06/12/2023 EXAM: CT CHEST, ABDOMEN, AND PELVIS WITH CONTRAST IMPRESSION: 1. Status post right hemicolectomy, without recurrent or metastatic disease. Decreased sensitivity exam, primarily involving the abdomen pelvis, due to paucity of fat. 2. New tiny left groin hernia containing fluid.  Consider physical exam correlation. 3. Incidental findings, including: Coronary artery atherosclerosis. Aortic Atherosclerosis (ICD10-I70.0). Dilated esophagus with contrast throughout, suggesting dysmotility and/or gastroesophageal reflux.  COGNITION: Overall cognitive status: Within functional limits for tasks assessed  SENSATION: Light touch: Impaired  mild paraesthesia in the proximal RLE   COORDINATION: WFL. Ankle to knee limited ROM due to weakness in the R hip      POSTURE: rounded shoulders, forward head, flexed trunk , and severe scoliosis     LOWER EXTREMITY MMT:    MMT Right Eval Left Eval  Hip flexion 4- 4  Hip extension    Hip abduction 4- 44  Hip adduction 4 4  Hip internal rotation    Hip external rotation    Knee flexion 4- 4  Knee extension 4 4+  Ankle dorsiflexion 4 4+  Ankle plantarflexion    Ankle inversion    Ankle eversion    (Blank rows = not tested)    TRANSFERS: Assistive device utilized: Environmental consultant - 4 wheeled  Sit to stand: SBA Stand to sit: SBA Chair to chair: SBA Floor: Unable to perform    STAIRS: Level of Assistance: pt declines Stair Negotiation Technique: Step to Pattern with Bilateral Rails Number of Stairs: 4  Height of Stairs: 6  Comments: will need to assess.   GAIT: Gait pattern: step through pattern, Right foot flat, and lateral hip instability Distance walked: 226ft Assistive device utilized: Walker - 4 wheeled Level of assistance: Modified independence and SBA Comments: severe scoliosis.    FUNCTIONAL TESTS:  5 times sit to stand: 30.86 Timed up and go (TUG): 29.39 2 minute walk test: 240ft  10 meter walk test: 0.690m/s, SOB 4-5.  Berg Balance Scale: TBD  PATIENT SURVEYS:  FOTO 56. goal 64  TODAY'S TREATMENT 06/13/24   Gait training ~329ft using rollator  In // bars:  Forward/backwards gait 3 laps without UE support Side steppig 2 laps Added RTB around knees, additional 2 laps Forward/backwards  stepping over 1/2 foam roll Started with just 1x 1/2 foam roll progressed to 2 1/2 foam rolls in a row to perform reciprocal stepping Requires L UE support on // bar with skilled light min A 2x5 reps per LE  Side stepping over 2x 1/2 foam rolls x6reps each direction Alternating foot taps to brown step with 2 Blaze Pods on top, set on sequence to encourage increased speed of movement with 1/2 second delay, able to do with only brief moments of L UE support, otherwise hovering hands 66min30sec achieving 52 hits then 66 hits  Pt reports the above interventions as challenging.   Gait training final ~116ft using rollator with SBA  Pt required occasional rest breaks due fatigue, PT was attentive to when pt appeared to be tired or winded in order to prevent excessive fatigue.   PATIENT EDUCATION: Education details: Pt educated throughout session about proper posture and technique with exercises. Improved exercise technique, movement at target joints, use of target muscles after min to mod verbal, visual, tactile cues. Person educated: Patient Education method: Explanation, demo, VC Education comprehension: verbalized understanding and returned demonstration  HOME EXERCISE PROGRAM: Access Code: [email or web address] URL: [email or web address] Date: 05/30/2024 Prepared by: Peggye Linear  Exercises - Side Stepping with Counter Support  - 2 sets - 30 reps - Heel Raises with Counter Support  - 3 sets - 15 reps - Standing March with Counter Support  - 2 sets - 30 reps - Standing Tandem Balance with Unilateral Counter Support  - 3 sets - 10 reps - 15 hold  Access Code: [email or web address] URL: [email or web address] Date: 09/25/2023 Prepared by: Massie Dollar  Exercises - Seated Hip Abduction with Resistance  - 1 x daily - 4 x weekly - 3  sets - 15 reps - 3 hold - Seated March with Resistance  - 1 x daily - 4 x weekly - 3 sets - 10 reps - 1 hold - Seated Long Arc Quad  - 1 x daily - 4 x  weekly - 3 sets - 12 reps - 3 hold - Seated Hip Adduction Isometrics with Ball  - 1 x daily - 4 x weekly - 3 sets - 12 reps - 3 hold - Standing March with Counter Support  - 1 x daily - 3 x weekly - 2 sets - 10 reps - Standing Hip Abduction with Counter Support  - 1 x daily - 3 x weekly - 2 sets - 10 reps - Standing Hip Extension with Counter Support  - 1 x daily - 3 x weekly - 2 sets - 10 reps - Mini Squat with Counter Support  - 1 x daily - 3 x weekly - 2 sets - 10 reps - Standing Knee Flexion with Counter Support  - 1 x daily - 3 x weekly - 2 sets - 10 reps - Standing Tandem Balance with Counter Support  - 1 x daily - 3 x weekly - 2 sets - 4 reps - 10 hold  GOALS: Goals reviewed with patient? Yes  SHORT TERM GOALS: Target date: 12/28/23  Patient will be independent in home exercise program to improve strength/mobility for better functional independence with ADLs. Baseline:to be given at next session; 10/23/2023- Patient reports independence with HEP provided and no significant issues.   Goal status: MET 2. Pt will be able to ambulate for at least 5 min without stopping with LRAD to allow improved access to community and return to PLOF  Baseline: 3 minutes. 2/11: 6 minute walk test completed.   Goal Status: MET  . LONG TERM GOALS: Target date: 07/16/2024  Patient will increase FOTO score to equal to or greater than 61 to demonstrate statistically significant improvement in mobility and quality of life.  Baseline: 56; 10/23/2023= 60 Goal status: PROGRESSING/DISCONTINUED   2.  Patient (> 29 years old) will complete five times sit to stand test in < 15 seconds  without UE support indicating an increased LE strength and improved balance. Baseline: 30.86 sec; 10/23/2023= 22.08 using 1 UE support.  12/11: 14.67 sec BUE from arm rest. And 14.12 with 1 UE pushing from arm rest. 01/30/24 13 seconds with UE  3/26: 15.0  able to achieve full stand on each repetition  Goal status: MET  3.  Patient  will increase Berg Balance score to >45 points to demonstrate decreased fall risk during functional activities  Baseline: 26; 10/23/2023= 30 12/11: 34 01/30/24: 41/56 3/26: 40/56 04/23/2024= 44/56 Goal status: Progressing  4.  Patient will increase 10 meter walk test to >1.24m/s as to improve gait speed for better community ambulation and to reduce fall risk. Baseline: 0.672m/s; 10/23/2023= 0.88 m/s (normal speed with 4WW) 12/11: Average Fast speed: 1.05 m/s Average Normal speed: 0.834 m/s 01/30/24: 1.14 m/s 3/26: 1.0 m/s Goal status: MET  5.  Patient will reduce timed up and go to <11 seconds to reduce fall risk and demonstrate improved transfer/gait ability. Baseline: 29.39 12/2: 16.4 sec with Rollator  12/11: 15.19 sec with rollator  2/4: 17.38 sec with rollator  3/4: 15 sec with rollator  3/26: 13.76 sec with rollator  04/23/2024= 12.34 sec avg with SPC Goal status: IN PROGRESS  6.  Patient will 2 min walk test by at least 27ft as to demonstrate reduced  fall risk and improved dynamic gait balance for better safety with community/home ambulation.  Baseline: 267ft; 10/23/2023= 360 feet  with 4WW 352ft with 4WW Goal status: MET  7. Pt will complete 6 min walk test without stopping and improve overall score by at least 165ft to indicate increased access to community.  Baseline: to be complete 2/11:880 ft with RW; 01/30/24: 1168 ft with RW  3/26: 1113ft with Rollator  Goal: MET   ASSESSMENT: CLINICAL IMPRESSION:  Therapy session focused on dynamic gait training and dynamic balance interventions in the // bar for increased pt safety and to allow her to attempt higher level balance challenges. Patient demonstrates significant improvement in dynamic balance with ability to perform quick, alternating foot taps to brown step with Blaze Pods requiring only intermittent LUE support to regain balance. Patient demonstrates greatest difficulty with forward/backwards stepping over 2x 1/2 foam rolls  with greater SLS imbalance with this larger stepping task. Ms. Nevins will continue to benefit from skilled physical therapy intervention to address stated impairments, improve QOL, decrease fall risk, and attain therapy goals.   OBJECTIVE IMPAIRMENTS: Abnormal gait, cardiopulmonary status limiting activity, decreased activity tolerance, decreased balance, decreased endurance, decreased mobility, difficulty walking, decreased ROM, decreased strength, decreased safety awareness, hypomobility, increased fascial restrictions, impaired flexibility, improper body mechanics, and postural dysfunction.   ACTIVITY LIMITATIONS: lifting, standing, squatting, transfers, and locomotion level  PARTICIPATION LIMITATIONS: laundry, shopping, and community activity  PERSONAL FACTORS: 3+ comorbidities: scoliosis, hx of CA, and PE are also affecting patient's functional outcome.   REHAB POTENTIAL: Good  CLINICAL DECISION MAKING: Stable/uncomplicated  EVALUATION COMPLEXITY: Moderate  PLAN:  PT FREQUENCY: 1-2x/week  PT DURATION: 12 weeks  PLANNED INTERVENTIONS: Therapeutic exercises, Therapeutic activity, Neuromuscular re-education, Balance training, Gait training, Patient/Family education, Self Care, Joint mobilization, Stair training, Vestibular training, DME instructions, Dry Needling, Spinal mobilization, Cryotherapy, Moist heat, and Manual therapy  PLAN FOR NEXT SESSION:  Continue Dynamic balance and gait training. Progressing to higher level variable direction stepping and reaching tasks. BLE and postural strengthening.   Connell Kiss, PT, DPT, NCS, CSRS Physical Therapist - [place]  [facility]  4:14 PM 06/13/24

## 2024-06-18 ENCOUNTER — Ambulatory Visit

## 2024-06-18 DIAGNOSIS — R2689 Other abnormalities of gait and mobility: Secondary | ICD-10-CM

## 2024-06-18 DIAGNOSIS — M6281 Muscle weakness (generalized): Secondary | ICD-10-CM | POA: Diagnosis not present

## 2024-06-18 DIAGNOSIS — R2681 Unsteadiness on feet: Secondary | ICD-10-CM

## 2024-06-18 DIAGNOSIS — R262 Difficulty in walking, not elsewhere classified: Secondary | ICD-10-CM

## 2024-06-18 DIAGNOSIS — M5459 Other low back pain: Secondary | ICD-10-CM

## 2024-06-18 DIAGNOSIS — R278 Other lack of coordination: Secondary | ICD-10-CM

## 2024-06-18 NOTE — Therapy (Signed)
 OUTPATIENT PHYSICAL THERAPY TREATMENT   Patient Name: Kristina Rowe MRN: [id number] DOB:[birth year], 89 y.o., female Today's Date: 06/19/2024  PCP: Sherial Bail, MD  REFERRING PROVIDER:   Suzzane Charleston, PA-C   END OF SESSION:   PT End of Session - 06/18/24 1535     Visit Number 49    Number of Visits 59    Date for PT Re-Evaluation 07/16/24    Progress Note Due on Visit 50    PT Start Time 1531    PT Stop Time 1613    PT Time Calculation (min) 42 min    Equipment Utilized During Treatment Gait belt    Activity Tolerance Patient tolerated treatment well;No increased pain    Behavior During Therapy WFL for tasks assessed/performed            Past Medical History:  Diagnosis Date   Ductal carcinoma in situ (DCIS) of left breast    Dysphagia    Esophageal stricture    Gastroparesis    GERD (gastroesophageal reflux disease)    Hiatal hernia    Hypertension    Iron deficiency anemia    Osteoporosis    Pulmonary embolism (HCC)    Scoliosis    UTI (urinary tract infection)    Past Surgical History:  Procedure Laterality Date   BREAST LUMPECTOMY Left 2009   Ductal Carcinoma insitu    CATARACT EXTRACTION, BILATERAL     COLONOSCOPY N/A 03/17/2021   Procedure: COLONOSCOPY;  Surgeon: Maryruth Ole DASEN, MD;  Location: [facility];  Service: Endoscopy;  Laterality: N/A;   COLOSTOMY REVISION Right 03/18/2021   Procedure: COLON RESECTION RIGHT;  Surgeon: Desiderio Schanz, MD;  Location: [facility];  Service: General;  Laterality: Right;   LAPAROSCOPIC PARAESOPHAGEAL HERNIA REPAIR  2009   LAPAROTOMY N/A 03/10/2021   Procedure: EXPLORATORY LAPAROTOMY;  Surgeon: Desiderio Schanz, MD;  Location: [facility];  Service: General;  Laterality: N/A;   TUBAL LIGATION     Patient Active Problem List   Diagnosis Date Noted   Acute on chronic diastolic CHF (congestive heart failure) (HCC) 08/19/2023   Acute respiratory failure with hypoxia (HCC) 08/17/2023   History of pulmonary  embolus (PE) 08/17/2023   Basal cell carcinoma of leg 04/01/2021   Coronary atherosclerosis 04/01/2021   History of pulmonary embolism 04/01/2021   Pulmonary nodule 04/01/2021   Malignant neoplasm of colon (HCC) 03/29/2021   Rectal bleeding 03/15/2021   Hypertension    GERD (gastroesophageal reflux disease)    Colonic mass    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HCC) 03/10/2021   Anemia, unspecified 03/06/2021   Bronchiectasis without complication (HCC) 03/27/2020   Iron deficiency anemia 03/27/2020   Moderate aortic stenosis 03/27/2020   Stage 3a chronic kidney disease (HCC) 07/04/2019   Hypnic headache 05/28/2019   Telogen effluvium 04/17/2019   Xerosis cutis 04/17/2019   Essential hypertension 01/14/2019   Cervical radiculopathy 05/04/2018   Osteoarthritis of left glenohumeral joint 05/04/2018   Vitamin B12 deficiency 01/01/2018   Vascular abnormality 09/11/2017   Dyspepsia 03/28/2017   Pulmonary embolism (HCC) 08/16/2016   Dilated pore of [place] 03/23/2015   Retinal drusen of both eyes 08/28/2014   Status post right cataract extraction 07/30/2014   Verruca 03/19/2014   Chronic back pain 11/06/2013   Postmenopausal osteoporosis 06/11/2013   Anticoagulated on Coumadin  04/06/2013   Long term (current) use of anticoagulants 10/18/2012   Preglaucoma 05/10/2012   Presbyopia 05/10/2012   Senile nuclear sclerosis 05/10/2012   Lobular carcinoma in situ of breast 04/13/2012  Closed fracture of ankle 02/27/2012   History of nonmelanoma skin cancer 09/05/2011   Other benign neoplasm of skin of trunk 09/05/2011   Osteoporosis 08/25/2011   Transient alteration of awareness 09/03/2009   Colon adenoma 07/30/2002    ONSET DATE: 3 months   REFERRING DIAG:  Diagnosis  R53.83 (ICD-10-CM) - Other fatigue    THERAPY DIAG:  Muscle weakness (generalized)  Difficulty in walking, not elsewhere classified  Unsteadiness on feet  Other abnormalities of gait and mobility  Other low back  pain  Other lack of coordination  Rationale for Evaluation and Treatment: Rehabilitation  SUBJECTIVE:                                                                                                                                                                                             SUBJECTIVE STATEMENT:   Pt reports doing well overall today without any new complaints.   Pt accompanied by: self  PERTINENT HISTORY:  From recent MD visit.  89 y.o. here for an acute issue. She has a PMH of chronic PE/Coumadin , anemia, colon cancer, breast cancer, large hiatal hernia, moderate aortic stenosis who has concerns about shortness of breath, nausea, generalized weakness. No chest pain. History of hiatal hernia and recent CT chest. Also recent endoscopy. Takes Zofran  chronically. Has felt weaker and almost fell recently. Uses a walker. Also on chronic UTI suppression, Keflex .   PAIN:  Are you having pain?  Yes, typical lef tlow back pain 8/10  PRECAUTIONS: Fall  RED FLAGS: None   WEIGHT BEARING RESTRICTIONS: No  FALLS: Has patient fallen in last 6 months? No  LIVING ENVIRONMENT: Lives with: lives alone Lives in: House/apartment Stairs: Yes: External: 1 steps; none Has following equipment at home: Walker - 4 wheeled  PLOF: Independent with household mobility with device and Independent with community mobility with device  PATIENT GOALS: improved strength in BLE   OBJECTIVE:   DIAGNOSTIC FINDINGS:  06/12/2023 EXAM: CT CHEST, ABDOMEN, AND PELVIS WITH CONTRAST IMPRESSION: 1. Status post right hemicolectomy, without recurrent or metastatic disease. Decreased sensitivity exam, primarily involving the abdomen pelvis, due to paucity of fat. 2. New tiny left groin hernia containing fluid. Consider physical exam correlation. 3. Incidental findings, including: Coronary artery atherosclerosis. Aortic Atherosclerosis (ICD10-I70.0). Dilated esophagus with contrast throughout,  suggesting dysmotility and/or gastroesophageal reflux.  COGNITION: Overall cognitive status: Within functional limits for tasks assessed   SENSATION: Light touch: Impaired  mild paraesthesia in the proximal RLE   COORDINATION: WFL. Ankle to knee limited ROM due to weakness in the R hip      POSTURE: rounded shoulders, forward head,  flexed trunk , and severe scoliosis     LOWER EXTREMITY MMT:    MMT Right Eval Left Eval  Hip flexion 4- 4  Hip extension    Hip abduction 4- 44  Hip adduction 4 4  Hip internal rotation    Hip external rotation    Knee flexion 4- 4  Knee extension 4 4+  Ankle dorsiflexion 4 4+  Ankle plantarflexion    Ankle inversion    Ankle eversion    (Blank rows = not tested)    TRANSFERS: Assistive device utilized: Environmental consultant - 4 wheeled  Sit to stand: SBA Stand to sit: SBA Chair to chair: SBA Floor: Unable to perform    STAIRS: Level of Assistance: pt declines Stair Negotiation Technique: Step to Pattern with Bilateral Rails Number of Stairs: 4  Height of Stairs: 6  Comments: will need to assess.   GAIT: Gait pattern: step through pattern, Right foot flat, and lateral hip instability Distance walked: 223ft Assistive device utilized: Walker - 4 wheeled Level of assistance: Modified independence and SBA Comments: severe scoliosis.    FUNCTIONAL TESTS:  5 times sit to stand: 30.86 Timed up and go (TUG): 29.39 2 minute walk test: 243ft  10 meter walk test: 0.650m/s, SOB 4-5.  Berg Balance Scale: TBD  PATIENT SURVEYS:  FOTO 56. goal 64  TODAY'S TREATMENT 06/19/24     Neuromuscular Re-Education: neuromuscular reeducation of movement, balance, coordination, kinesthetic sense, posture and proprioception for sitting and/or standing  - Stand scap retraction 2 x 15 reps GTB -Stand shoulder ext 2 x 15 reps GTB -Standing unilateral horizontal ABD 2 x 15 reps -Step up onto 6 block x 15 rep alt LE   -Static stand on airex pad with feet  narrowed- - Mild unsteadiness yet no LOB -Staggered standing at support bar (dry erase board) - moving magnet letters into groups by color- switching every 8 letters Ambulation in clinic x 450 feet with Rollator, only minimal VC for posture  TA:  Sit to stand with minimal 1UE support x 12 reps    Pt required occasional rest breaks due fatigue, PT was attentive to when pt appeared to be tired or winded in order to prevent excessive fatigue.   PATIENT EDUCATION: Education details: Pt educated throughout session about proper posture and technique with exercises. Improved exercise technique, movement at target joints, use of target muscles after min to mod verbal, visual, tactile cues. Person educated: Patient Education method: Explanation, demo, VC Education comprehension: verbalized understanding and returned demonstration  HOME EXERCISE PROGRAM: Access Code: [email or web address] URL: [email or web address] Date: 05/30/2024 Prepared by: Peggye Linear  Exercises - Side Stepping with Counter Support  - 2 sets - 30 reps - Heel Raises with Counter Support  - 3 sets - 15 reps - Standing March with Counter Support  - 2 sets - 30 reps - Standing Tandem Balance with Unilateral Counter Support  - 3 sets - 10 reps - 15 hold  Access Code: [email or web address] URL: [email or web address] Date: 09/25/2023 Prepared by: Massie Dollar  Exercises - Seated Hip Abduction with Resistance  - 1 x daily - 4 x weekly - 3 sets - 15 reps - 3 hold - Seated March with Resistance  - 1 x daily - 4 x weekly - 3 sets - 10 reps - 1 hold - Seated Long Arc Quad  - 1 x daily - 4 x weekly - 3 sets - 12 reps - 3 hold - Seated Hip Adduction Isometrics with Ball  - 1  x daily - 4 x weekly - 3 sets - 12 reps - 3 hold - Standing March with Counter Support  - 1 x daily - 3 x weekly - 2 sets - 10 reps - Standing Hip Abduction with Counter Support  - 1 x daily - 3 x weekly - 2 sets - 10 reps - Standing Hip Extension  with Counter Support  - 1 x daily - 3 x weekly - 2 sets - 10 reps - Mini Squat with Counter Support  - 1 x daily - 3 x weekly - 2 sets - 10 reps - Standing Knee Flexion with Counter Support  - 1 x daily - 3 x weekly - 2 sets - 10 reps - Standing Tandem Balance with Counter Support  - 1 x daily - 3 x weekly - 2 sets - 4 reps - 10 hold  GOALS: Goals reviewed with patient? Yes  SHORT TERM GOALS: Target date: 12/28/23  Patient will be independent in home exercise program to improve strength/mobility for better functional independence with ADLs. Baseline:to be given at next session; 10/23/2023- Patient reports independence with HEP provided and no significant issues.   Goal status: MET 2. Pt will be able to ambulate for at least 5 min without stopping with LRAD to allow improved access to community and return to PLOF  Baseline: 3 minutes. 2/11: 6 minute walk test completed.   Goal Status: MET  . LONG TERM GOALS: Target date: 07/16/2024  Patient will increase FOTO score to equal to or greater than 61 to demonstrate statistically significant improvement in mobility and quality of life.  Baseline: 56; 10/23/2023= 60 Goal status: PROGRESSING/DISCONTINUED   2.  Patient (> 19 years old) will complete five times sit to stand test in < 15 seconds  without UE support indicating an increased LE strength and improved balance. Baseline: 30.86 sec; 10/23/2023= 22.08 using 1 UE support.  12/11: 14.67 sec BUE from arm rest. And 14.12 with 1 UE pushing from arm rest. 01/30/24 13 seconds with UE  3/26: 15.0  able to achieve full stand on each repetition  Goal status: MET  3.  Patient will increase Berg Balance score to >45 points to demonstrate decreased fall risk during functional activities  Baseline: 26; 10/23/2023= 30 12/11: 34 01/30/24: 41/56 3/26: 40/56 04/23/2024= 44/56 Goal status: Progressing  4.  Patient will increase 10 meter walk test to >1.106m/s as to improve gait speed for better community  ambulation and to reduce fall risk. Baseline: 0.656m/s; 10/23/2023= 0.88 m/s (normal speed with 4WW) 12/11: Average Fast speed: 1.05 m/s Average Normal speed: 0.834 m/s 01/30/24: 1.14 m/s 3/26: 1.0 m/s Goal status: MET  5.  Patient will reduce timed up and go to <11 seconds to reduce fall risk and demonstrate improved transfer/gait ability. Baseline: 29.39 12/2: 16.4 sec with Rollator  12/11: 15.19 sec with rollator  2/4: 17.38 sec with rollator  3/4: 15 sec with rollator  3/26: 13.76 sec with rollator  04/23/2024= 12.34 sec avg with SPC Goal status: IN PROGRESS  6.  Patient will 2 min walk test by at least 92ft as to demonstrate reduced fall risk and improved dynamic gait balance for better safety with community/home ambulation.  Baseline: 225ft; 10/23/2023= 360 feet  with 4WW 383ft with 4WW Goal status: MET  7. Pt will complete 6 min walk test without stopping and improve overall score by at least 168ft to indicate increased access to community.  Baseline: to be complete 2/11:880 ft with RW; 01/30/24:  1168 ft with RW  3/26: 1170ft with Rollator  Goal: MET   ASSESSMENT: CLINICAL IMPRESSION:  Treatment focused on postural awareness and patient was able to demo improvement including increased resistance with less VC for form today. She was able to complete remaining activities with very minimal rest and no report of pain. Overall improved steps - able to stay within walker and no foot drag or worsening posture with gait. Ms. Heggs will continue to benefit from skilled physical therapy intervention to address stated impairments, improve QOL, decrease fall risk, and attain therapy goals.   OBJECTIVE IMPAIRMENTS: Abnormal gait, cardiopulmonary status limiting activity, decreased activity tolerance, decreased balance, decreased endurance, decreased mobility, difficulty walking, decreased ROM, decreased strength, decreased safety awareness, hypomobility, increased fascial restrictions,  impaired flexibility, improper body mechanics, and postural dysfunction.   ACTIVITY LIMITATIONS: lifting, standing, squatting, transfers, and locomotion level  PARTICIPATION LIMITATIONS: laundry, shopping, and community activity  PERSONAL FACTORS: 3+ comorbidities: scoliosis, hx of CA, and PE are also affecting patient's functional outcome.   REHAB POTENTIAL: Good  CLINICAL DECISION MAKING: Stable/uncomplicated  EVALUATION COMPLEXITY: Moderate  PLAN:  PT FREQUENCY: 1-2x/week  PT DURATION: 12 weeks  PLANNED INTERVENTIONS: Therapeutic exercises, Therapeutic activity, Neuromuscular re-education, Balance training, Gait training, Patient/Family education, Self Care, Joint mobilization, Stair training, Vestibular training, DME instructions, Dry Needling, Spinal mobilization, Cryotherapy, Moist heat, and Manual therapy  PLAN FOR NEXT SESSION:  Continue Dynamic balance and gait training. Progressing to higher level variable direction stepping and reaching tasks. BLE and postural strengthening. PROGRESS REPORT DUE NEXT VISIT   Chyrl London, PT Physical Therapist - [facility]  [facility]  1:52 PM 06/19/24

## 2024-06-20 ENCOUNTER — Ambulatory Visit

## 2024-06-20 DIAGNOSIS — R2689 Other abnormalities of gait and mobility: Secondary | ICD-10-CM

## 2024-06-20 DIAGNOSIS — R2681 Unsteadiness on feet: Secondary | ICD-10-CM

## 2024-06-20 DIAGNOSIS — M5459 Other low back pain: Secondary | ICD-10-CM

## 2024-06-20 DIAGNOSIS — R278 Other lack of coordination: Secondary | ICD-10-CM

## 2024-06-20 DIAGNOSIS — M6281 Muscle weakness (generalized): Secondary | ICD-10-CM

## 2024-06-20 DIAGNOSIS — R262 Difficulty in walking, not elsewhere classified: Secondary | ICD-10-CM

## 2024-06-20 NOTE — Therapy (Signed)
 OUTPATIENT PHYSICAL THERAPY TREATMENT/Physical Therapy Progress Note   Dates of reporting period  05/03/2024   to   06/20/2024   Patient Name: Kristina Rowe MRN: [id number] DOB:[birth year], 89 y.o., female Today's Date: 06/21/2024  PCP: Sherial Bail, MD  REFERRING PROVIDER:   Suzzane Charleston, PA-C   END OF SESSION:   PT End of Session - 06/20/24 1542     Visit Number 50    Number of Visits 59    Date for PT Re-Evaluation 07/16/24    Progress Note Due on Visit 60    PT Start Time 1530    PT Stop Time 1614    PT Time Calculation (min) 44 min    Equipment Utilized During Treatment Gait belt    Activity Tolerance Patient tolerated treatment well;No increased pain    Behavior During Therapy WFL for tasks assessed/performed             Past Medical History:  Diagnosis Date   Ductal carcinoma in situ (DCIS) of left breast    Dysphagia    Esophageal stricture    Gastroparesis    GERD (gastroesophageal reflux disease)    Hiatal hernia    Hypertension    Iron deficiency anemia    Osteoporosis    Pulmonary embolism (HCC)    Scoliosis    UTI (urinary tract infection)    Past Surgical History:  Procedure Laterality Date   BREAST LUMPECTOMY Left 2009   Ductal Carcinoma insitu    CATARACT EXTRACTION, BILATERAL     COLONOSCOPY N/A 03/17/2021   Procedure: COLONOSCOPY;  Surgeon: Maryruth Ole DASEN, MD;  Location: [facility];  Service: Endoscopy;  Laterality: N/A;   COLOSTOMY REVISION Right 03/18/2021   Procedure: COLON RESECTION RIGHT;  Surgeon: Desiderio Schanz, MD;  Location: [facility];  Service: General;  Laterality: Right;   LAPAROSCOPIC PARAESOPHAGEAL HERNIA REPAIR  2009   LAPAROTOMY N/A 03/10/2021   Procedure: EXPLORATORY LAPAROTOMY;  Surgeon: Desiderio Schanz, MD;  Location: [facility];  Service: General;  Laterality: N/A;   TUBAL LIGATION     Patient Active Problem List   Diagnosis Date Noted   Acute on chronic diastolic CHF (congestive heart failure) (HCC)  08/19/2023   Acute respiratory failure with hypoxia (HCC) 08/17/2023   History of pulmonary embolus (PE) 08/17/2023   Basal cell carcinoma of leg 04/01/2021   Coronary atherosclerosis 04/01/2021   History of pulmonary embolism 04/01/2021   Pulmonary nodule 04/01/2021   Malignant neoplasm of colon (HCC) 03/29/2021   Rectal bleeding 03/15/2021   Hypertension    GERD (gastroesophageal reflux disease)    Colonic mass    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HCC) 03/10/2021   Anemia, unspecified 03/06/2021   Bronchiectasis without complication (HCC) 03/27/2020   Iron deficiency anemia 03/27/2020   Moderate aortic stenosis 03/27/2020   Stage 3a chronic kidney disease (HCC) 07/04/2019   Hypnic headache 05/28/2019   Telogen effluvium 04/17/2019   Xerosis cutis 04/17/2019   Essential hypertension 01/14/2019   Cervical radiculopathy 05/04/2018   Osteoarthritis of left glenohumeral joint 05/04/2018   Vitamin B12 deficiency 01/01/2018   Vascular abnormality 09/11/2017   Dyspepsia 03/28/2017   Pulmonary embolism (HCC) 08/16/2016   Dilated pore of [place] 03/23/2015   Retinal drusen of both eyes 08/28/2014   Status post right cataract extraction 07/30/2014   Verruca 03/19/2014   Chronic back pain 11/06/2013   Postmenopausal osteoporosis 06/11/2013   Anticoagulated on Coumadin  04/06/2013   Long term (current) use of anticoagulants 10/18/2012   Preglaucoma 05/10/2012  Presbyopia 05/10/2012   Senile nuclear sclerosis 05/10/2012   Lobular carcinoma in situ of breast 04/13/2012   Closed fracture of ankle 02/27/2012   History of nonmelanoma skin cancer 09/05/2011   Other benign neoplasm of skin of trunk 09/05/2011   Osteoporosis 08/25/2011   Transient alteration of awareness 09/03/2009   Colon adenoma 07/30/2002    ONSET DATE: 3 months   REFERRING DIAG:  Diagnosis  R53.83 (ICD-10-CM) - Other fatigue    THERAPY DIAG:  Muscle weakness (generalized)  Difficulty in walking, not elsewhere  classified  Unsteadiness on feet  Other abnormalities of gait and mobility  Other low back pain  Other lack of coordination  Rationale for Evaluation and Treatment: Rehabilitation  SUBJECTIVE:                                                                                                                                                                                             SUBJECTIVE STATEMENT:   Pt reports doing well overall today without any new complaints.   Pt accompanied by: self  PERTINENT HISTORY:  From recent MD visit.  89 y.o. here for an acute issue. She has a PMH of chronic PE/Coumadin , anemia, colon cancer, breast cancer, large hiatal hernia, moderate aortic stenosis who has concerns about shortness of breath, nausea, generalized weakness. No chest pain. History of hiatal hernia and recent CT chest. Also recent endoscopy. Takes Zofran  chronically. Has felt weaker and almost fell recently. Uses a walker. Also on chronic UTI suppression, Keflex .   PAIN:  Are you having pain?  Yes, typical left tlow back pain 8/10  PRECAUTIONS: Fall  RED FLAGS: None   WEIGHT BEARING RESTRICTIONS: No  FALLS: Has patient fallen in last 6 months? No  LIVING ENVIRONMENT: Lives with: lives alone Lives in: House/apartment Stairs: Yes: External: 1 steps; none Has following equipment at home: Walker - 4 wheeled  PLOF: Independent with household mobility with device and Independent with community mobility with device  PATIENT GOALS: improved strength in BLE   OBJECTIVE:   DIAGNOSTIC FINDINGS:  06/12/2023 EXAM: CT CHEST, ABDOMEN, AND PELVIS WITH CONTRAST IMPRESSION: 1. Status post right hemicolectomy, without recurrent or metastatic disease. Decreased sensitivity exam, primarily involving the abdomen pelvis, due to paucity of fat. 2. New tiny left groin hernia containing fluid. Consider physical exam correlation. 3. Incidental findings, including: Coronary artery  atherosclerosis. Aortic Atherosclerosis (ICD10-I70.0). Dilated esophagus with contrast throughout, suggesting dysmotility and/or gastroesophageal reflux.  COGNITION: Overall cognitive status: Within functional limits for tasks assessed   SENSATION: Light touch: Impaired  mild paraesthesia in the proximal RLE   COORDINATION: WFL. Ankle to knee  limited ROM due to weakness in the R hip      POSTURE: rounded shoulders, forward head, flexed trunk , and severe scoliosis     LOWER EXTREMITY MMT:    MMT Right Eval Left Eval  Hip flexion 4- 4  Hip extension    Hip abduction 4- 44  Hip adduction 4 4  Hip internal rotation    Hip external rotation    Knee flexion 4- 4  Knee extension 4 4+  Ankle dorsiflexion 4 4+  Ankle plantarflexion    Ankle inversion    Ankle eversion    (Blank rows = not tested)    TRANSFERS: Assistive device utilized: Environmental consultant - 4 wheeled  Sit to stand: SBA Stand to sit: SBA Chair to chair: SBA Floor: Unable to perform    STAIRS: Level of Assistance: pt declines Stair Negotiation Technique: Step to Pattern with Bilateral Rails Number of Stairs: 4  Height of Stairs: 6  Comments: will need to assess.   GAIT: Gait pattern: step through pattern, Right foot flat, and lateral hip instability Distance walked: 246ft Assistive device utilized: Walker - 4 wheeled Level of assistance: Modified independence and SBA Comments: severe scoliosis.    FUNCTIONAL TESTS:  5 times sit to stand: 30.86 Timed up and go (TUG): 29.39 2 minute walk test: 274ft  10 meter walk test: 0.661m/s, SOB 4-5.  Berg Balance Scale: TBD  PATIENT SURVEYS:  FOTO 56. goal 67  TODAY'S TREATMENT 06/21/24         Physical therapy treatment session today consisted of completing assessment of goals and administration of testing as demonstrated and documented in flow sheet, treatment, and goals section of this note. Addition treatments may be found below.       6 Min  Walk Test:  Instructed patient to ambulate as quickly and as safely as possible for 6 minutes using LRAD. Patient was allowed to take standing rest breaks without stopping the test, but if the patient required a sitting rest break the clock would be stopped and the test would be over.  Results: 1180 feet (360 meters, Avg speed 1.19m/s) using a rollator with SBA. Results indicate that the patient has reduced endurance with ambulation compared to age matched norms.  Age Matched Norms: 103-69 yo M: 12 F: 85, 73-79 yo M: 6 F: 471, 75-89 yo M: 417 F: 392 MDC: 58.21 meters (190.98 feet) or 50 meters (ANPTA Core Set of Outcome Measures for Adults with Neurologic Conditions, 2018)   PATIENT EDUCATION: Education details: Pt educated throughout session about proper posture and technique with exercises. Improved exercise technique, movement at target joints, use of target muscles after min to mod verbal, visual, tactile cues. Person educated: Patient Education method: Explanation, demo, VC Education comprehension: verbalized understanding and returned demonstration  HOME EXERCISE PROGRAM: Access Code: [email or web address] URL: [email or web address] Date: 05/30/2024 Prepared by: Peggye Linear  Exercises - Side Stepping with Counter Support  - 2 sets - 30 reps - Heel Raises with Counter Support  - 3 sets - 15 reps - Standing March with Counter Support  - 2 sets - 30 reps - Standing Tandem Balance with Unilateral Counter Support  - 3 sets - 10 reps - 15 hold  Access Code: [email or web address] URL: [email or web address] Date: 09/25/2023 Prepared by: Massie Dollar  Exercises - Seated Hip Abduction with Resistance  - 1 x daily - 4 x weekly - 3 sets - 15 reps - 3 hold - Seated March with Resistance  - 1 x daily -  4 x weekly - 3 sets - 10 reps - 1 hold - Seated Long Arc Quad  - 1 x daily - 4 x weekly - 3 sets - 12 reps - 3 hold - Seated Hip Adduction Isometrics with Ball  - 1 x daily - 4 x  weekly - 3 sets - 12 reps - 3 hold - Standing March with Counter Support  - 1 x daily - 3 x weekly - 2 sets - 10 reps - Standing Hip Abduction with Counter Support  - 1 x daily - 3 x weekly - 2 sets - 10 reps - Standing Hip Extension with Counter Support  - 1 x daily - 3 x weekly - 2 sets - 10 reps - Mini Squat with Counter Support  - 1 x daily - 3 x weekly - 2 sets - 10 reps - Standing Knee Flexion with Counter Support  - 1 x daily - 3 x weekly - 2 sets - 10 reps - Standing Tandem Balance with Counter Support  - 1 x daily - 3 x weekly - 2 sets - 4 reps - 10 hold  GOALS: Goals reviewed with patient? Yes  SHORT TERM GOALS: Target date: 12/28/23  Patient will be independent in home exercise program to improve strength/mobility for better functional independence with ADLs. Baseline:to be given at next session; 10/23/2023- Patient reports independence with HEP provided and no significant issues.   Goal status: MET 2. Pt will be able to ambulate for at least 5 min without stopping with LRAD to allow improved access to community and return to PLOF  Baseline: 3 minutes. 2/11: 6 minute walk test completed.   Goal Status: MET  . LONG TERM GOALS: Target date: 07/16/2024  Patient will increase FOTO score to equal to or greater than 61 to demonstrate statistically significant improvement in mobility and quality of life.  Baseline: 56; 10/23/2023= 60 Goal status: PROGRESSING/DISCONTINUED   2.  Patient (> 92 years old) will complete five times sit to stand test in < 15 seconds  without UE support indicating an increased LE strength and improved balance. Baseline: 30.86 sec; 10/23/2023= 22.08 using 1 UE support.  12/11: 14.67 sec BUE from arm rest. And 14.12 with 1 UE pushing from arm rest. 01/30/24 13 seconds with UE  3/26: 15.0  able to achieve full stand on each repetition  Goal status: MET  3.  Patient will increase Berg Balance score to >45 points to demonstrate decreased fall risk during  functional activities  Baseline: 26; 10/23/2023= 30 12/11: 34 01/30/24: 41/56 3/26: 40/56 04/23/2024= 44/56 06/20/2024= 46/56 Goal status: Will keep goal active to ensure consistent.   4.  Patient will increase 10 meter walk test to >1.62m/s as to improve gait speed for better community ambulation and to reduce fall risk. Baseline: 0.610m/s; 10/23/2023= 0.88 m/s (normal speed with 4WW) 12/11: Average Fast speed: 1.05 m/s Average Normal speed: 0.834 m/s 01/30/24: 1.14 m/s 3/26: 1.0 m/s Goal status: MET  5.  Patient will reduce timed up and go to <11 seconds to reduce fall risk and demonstrate improved transfer/gait ability. Baseline: 29.39 12/2: 16.4 sec with Rollator  12/11: 15.19 sec with rollator  2/4: 17.38 sec with rollator  3/4: 15 sec with rollator  3/26: 13.76 sec with rollator  04/23/2024= 12.34 sec avg with SPC Goal status: IN PROGRESS  6.  Patient will 2 min walk test by at least 86ft as to demonstrate reduced fall risk and improved dynamic gait balance for better  safety with community/home ambulation.  Baseline: 256ft; 10/23/2023= 360 feet  with 4WW 364ft with 4WW Goal status: MET  7. Pt will complete 6 min walk test without stopping and improve overall score by at least 126ft to indicate increased access to community.  Baseline: to be complete 2/11:880 ft with RW; 01/30/24: 1168 ft with RW  3/26: 1149ft with Rollator  06/20/2024= 1180 feet with Rollator  Goal: MET   ASSESSMENT: CLINICAL IMPRESSION:  Patient returns to PT for progress report visit today including some reassessment of LTG's. She performed well and continues to make some steady progress despite her scoliosis. She improved 2 points on BERG- and demonstrating a good walking speed. She also demonstrated improved 6 min walk test- a little below 17-89 y/o female but no norms for 90+. Patient's condition has the potential to improve in response to therapy. Maximum improvement is yet to be obtained. The anticipated  improvement is attainable and reasonable in a generally predictable time.  Patient reports   Ms. Leedom will continue to benefit from skilled physical therapy intervention to address stated impairments, improve QOL, decrease fall risk, and attain therapy goals.   OBJECTIVE IMPAIRMENTS: Abnormal gait, cardiopulmonary status limiting activity, decreased activity tolerance, decreased balance, decreased endurance, decreased mobility, difficulty walking, decreased ROM, decreased strength, decreased safety awareness, hypomobility, increased fascial restrictions, impaired flexibility, improper body mechanics, and postural dysfunction.   ACTIVITY LIMITATIONS: lifting, standing, squatting, transfers, and locomotion level  PARTICIPATION LIMITATIONS: laundry, shopping, and community activity  PERSONAL FACTORS: 3+ comorbidities: scoliosis, hx of CA, and PE are also affecting patient's functional outcome.   REHAB POTENTIAL: Good  CLINICAL DECISION MAKING: Stable/uncomplicated  EVALUATION COMPLEXITY: Moderate  PLAN:  PT FREQUENCY: 1-2x/week  PT DURATION: 12 weeks  PLANNED INTERVENTIONS: Therapeutic exercises, Therapeutic activity, Neuromuscular re-education, Balance training, Gait training, Patient/Family education, Self Care, Joint mobilization, Stair training, Vestibular training, DME instructions, Dry Needling, Spinal mobilization, Cryotherapy, Moist heat, and Manual therapy  PLAN FOR NEXT SESSION:  Continue Dynamic balance and gait training. Progressing to higher level variable direction stepping and reaching tasks. BLE and postural strengthening.   Chyrl London, PT Physical Therapist - [employer]  10:08 AM 06/21/24

## 2024-06-25 ENCOUNTER — Ambulatory Visit

## 2024-06-25 DIAGNOSIS — R2689 Other abnormalities of gait and mobility: Secondary | ICD-10-CM

## 2024-06-25 DIAGNOSIS — M6281 Muscle weakness (generalized): Secondary | ICD-10-CM

## 2024-06-25 DIAGNOSIS — R262 Difficulty in walking, not elsewhere classified: Secondary | ICD-10-CM

## 2024-06-25 DIAGNOSIS — R278 Other lack of coordination: Secondary | ICD-10-CM

## 2024-06-25 DIAGNOSIS — M5459 Other low back pain: Secondary | ICD-10-CM

## 2024-06-25 DIAGNOSIS — R2681 Unsteadiness on feet: Secondary | ICD-10-CM

## 2024-06-25 NOTE — Therapy (Signed)
 OUTPATIENT PHYSICAL THERAPY TREATMENT/Physical Therapy Progress Note   Dates of reporting period  05/03/2024   to   06/20/2024   Patient Name: Kristina Rowe MRN: [id number] DOB:06/09/1930, 89 y.o., female Today's Date: 06/25/2024  PCP: Sherial Bail, MD  REFERRING PROVIDER:   Suzzane Charleston, PA-C   END OF SESSION:   PT End of Session - 06/25/24 1542     Visit Number 51    Number of Visits 59    Date for PT Re-Evaluation 07/16/24    Progress Note Due on Visit 60    PT Start Time 1540    Equipment Utilized During Treatment Gait belt    Activity Tolerance Patient tolerated treatment well;No increased pain    Behavior During Therapy WFL for tasks assessed/performed             Past Medical History:  Diagnosis Date   Ductal carcinoma in situ (DCIS) of left breast    Dysphagia    Esophageal stricture    Gastroparesis    GERD (gastroesophageal reflux disease)    Hiatal hernia    Hypertension    Iron deficiency anemia    Osteoporosis    Pulmonary embolism (HCC)    Scoliosis    UTI (urinary tract infection)    Past Surgical History:  Procedure Laterality Date   BREAST LUMPECTOMY Left 2009   Ductal Carcinoma insitu    CATARACT EXTRACTION, BILATERAL     COLONOSCOPY N/A 03/17/2021   Procedure: COLONOSCOPY;  Surgeon: Maryruth Ole DASEN, MD;  Location: [facility];  Service: Endoscopy;  Laterality: N/A;   COLOSTOMY REVISION Right 03/18/2021   Procedure: COLON RESECTION RIGHT;  Surgeon: Desiderio Schanz, MD;  Location: [facility];  Service: General;  Laterality: Right;   LAPAROSCOPIC PARAESOPHAGEAL HERNIA REPAIR  2009   LAPAROTOMY N/A 03/10/2021   Procedure: EXPLORATORY LAPAROTOMY;  Surgeon: Desiderio Schanz, MD;  Location: [facility];  Service: General;  Laterality: N/A;   TUBAL LIGATION     Patient Active Problem List   Diagnosis Date Noted   Acute on chronic diastolic CHF (congestive heart failure) (HCC) 08/19/2023   Acute respiratory failure with hypoxia (HCC)  08/17/2023   History of pulmonary embolus (PE) 08/17/2023   Basal cell carcinoma of leg 04/01/2021   Coronary atherosclerosis 04/01/2021   History of pulmonary embolism 04/01/2021   Pulmonary nodule 04/01/2021   Malignant neoplasm of colon (HCC) 03/29/2021   Rectal bleeding 03/15/2021   Hypertension    GERD (gastroesophageal reflux disease)    Colonic mass    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HCC) 03/10/2021   Anemia, unspecified 03/06/2021   Bronchiectasis without complication (HCC) 03/27/2020   Iron deficiency anemia 03/27/2020   Moderate aortic stenosis 03/27/2020   Stage 3a chronic kidney disease (HCC) 07/04/2019   Hypnic headache 05/28/2019   Telogen effluvium 04/17/2019   Xerosis cutis 04/17/2019   Essential hypertension 01/14/2019   Cervical radiculopathy 05/04/2018   Osteoarthritis of left glenohumeral joint 05/04/2018   Vitamin B12 deficiency 01/01/2018   Vascular abnormality 09/11/2017   Dyspepsia 03/28/2017   Pulmonary embolism (HCC) 08/16/2016   Dilated pore of [place] 03/23/2015   Retinal drusen of both eyes 08/28/2014   Status post right cataract extraction 07/30/2014   Verruca 03/19/2014   Chronic back pain 11/06/2013   Postmenopausal osteoporosis 06/11/2013   Anticoagulated on Coumadin  04/06/2013   Long term (current) use of anticoagulants 10/18/2012   Preglaucoma 05/10/2012   Presbyopia 05/10/2012   Senile nuclear sclerosis 05/10/2012   Lobular carcinoma in situ of breast  04/13/2012   Closed fracture of ankle 02/27/2012   History of nonmelanoma skin cancer 09/05/2011   Other benign neoplasm of skin of trunk 09/05/2011   Osteoporosis 08/25/2011   Transient alteration of awareness 09/03/2009   Colon adenoma 07/30/2002    ONSET DATE: 3 months   REFERRING DIAG:  Diagnosis  R53.83 (ICD-10-CM) - Other fatigue    THERAPY DIAG:  Muscle weakness (generalized)  Difficulty in walking, not elsewhere classified  Unsteadiness on feet  Other abnormalities of gait and  mobility  Other low back pain  Other lack of coordination  Rationale for Evaluation and Treatment: Rehabilitation  SUBJECTIVE:                                                                                                                                                                                             SUBJECTIVE STATEMENT:   Pt reports doing well overall today without any new complaints.   Pt accompanied by: self  PERTINENT HISTORY:  From recent MD visit.  89 y.o. here for an acute issue. She has a PMH of chronic PE/Coumadin , anemia, colon cancer, breast cancer, large hiatal hernia, moderate aortic stenosis who has concerns about shortness of breath, nausea, generalized weakness. No chest pain. History of hiatal hernia and recent CT chest. Also recent endoscopy. Takes Zofran  chronically. Has felt weaker and almost fell recently. Uses a walker. Also on chronic UTI suppression, Keflex .   PAIN:  Are you having pain?  Yes, typical left tlow back pain 8/10  PRECAUTIONS: Fall  RED FLAGS: None   WEIGHT BEARING RESTRICTIONS: No  FALLS: Has patient fallen in last 6 months? No  LIVING ENVIRONMENT: Lives with: lives alone Lives in: House/apartment Stairs: Yes: External: 1 steps; none Has following equipment at home: Walker - 4 wheeled  PLOF: Independent with household mobility with device and Independent with community mobility with device  PATIENT GOALS: improved strength in BLE   OBJECTIVE:   DIAGNOSTIC FINDINGS:  06/12/2023 EXAM: CT CHEST, ABDOMEN, AND PELVIS WITH CONTRAST IMPRESSION: 1. Status post right hemicolectomy, without recurrent or metastatic disease. Decreased sensitivity exam, primarily involving the abdomen pelvis, due to paucity of fat. 2. New tiny left groin hernia containing fluid. Consider physical exam correlation. 3. Incidental findings, including: Coronary artery atherosclerosis. Aortic Atherosclerosis (ICD10-I70.0). Dilated esophagus  with contrast throughout, suggesting dysmotility and/or gastroesophageal reflux.  COGNITION: Overall cognitive status: Within functional limits for tasks assessed   SENSATION: Light touch: Impaired  mild paraesthesia in the proximal RLE   COORDINATION: WFL. Ankle to knee limited ROM due to weakness in the R hip      POSTURE: rounded  shoulders, forward head, flexed trunk , and severe scoliosis     LOWER EXTREMITY MMT:    MMT Right Eval Left Eval  Hip flexion 4- 4  Hip extension    Hip abduction 4- 44  Hip adduction 4 4  Hip internal rotation    Hip external rotation    Knee flexion 4- 4  Knee extension 4 4+  Ankle dorsiflexion 4 4+  Ankle plantarflexion    Ankle inversion    Ankle eversion    (Blank rows = not tested)    TRANSFERS: Assistive device utilized: Environmental consultant - 4 wheeled  Sit to stand: SBA Stand to sit: SBA Chair to chair: SBA Floor: Unable to perform    STAIRS: Level of Assistance: pt declines Stair Negotiation Technique: Step to Pattern with Bilateral Rails Number of Stairs: 4  Height of Stairs: 6  Comments: will need to assess.   GAIT: Gait pattern: step through pattern, Right foot flat, and lateral hip instability Distance walked: 288ft Assistive device utilized: Walker - 4 wheeled Level of assistance: Modified independence and SBA Comments: severe scoliosis.    FUNCTIONAL TESTS:  5 times sit to stand: 30.86 Timed up and go (TUG): 29.39 2 minute walk test: 234ft  10 meter walk test: 0.665m/s, SOB 4-5.  Berg Balance Scale: TBD  PATIENT SURVEYS:  FOTO 56. goal 64  TODAY'S TREATMENT 06/25/24   NMR:  -Dynamic walking in // bars- stepping over obstacles - pvc pipe, 1/2 foam/ and orange hurdle- down and back x 7.   - Side stepping in // bars stepping over obstacles - pvc pipe, 1/2 foam/ and orange hurdle- down and back x 7.   TA:  Ambulation with hurrycane- CGA  - no VC to look up and good reciprocal steps.   Sit to stand from  high mat (approx 24 in ) without UE support x 12   Mini lunges in // bars - down and back x 5 with very minimal UE support   Toe walk - BUE Support in // bars x 5.       PATIENT EDUCATION: Education details: Pt educated throughout session about proper posture and technique with exercises. Improved exercise technique, movement at target joints, use of target muscles after min to mod verbal, visual, tactile cues. Person educated: Patient Education method: Explanation, demo, VC Education comprehension: verbalized understanding and returned demonstration  HOME EXERCISE PROGRAM: Access Code: [email or web address] URL: [email or web address] Date: 05/30/2024 Prepared by: Peggye Linear  Exercises - Side Stepping with Counter Support  - 2 sets - 30 reps - Heel Raises with Counter Support  - 3 sets - 15 reps - Standing March with Counter Support  - 2 sets - 30 reps - Standing Tandem Balance with Unilateral Counter Support  - 3 sets - 10 reps - 15 hold  Access Code: [email or web address] URL: [email or web address] Date: 09/25/2023 Prepared by: Massie Dollar  Exercises - Seated Hip Abduction with Resistance  - 1 x daily - 4 x weekly - 3 sets - 15 reps - 3 hold - Seated March with Resistance  - 1 x daily - 4 x weekly - 3 sets - 10 reps - 1 hold - Seated Long Arc Quad  - 1 x daily - 4 x weekly - 3 sets - 12 reps - 3 hold - Seated Hip Adduction Isometrics with Ball  - 1 x daily - 4 x weekly - 3 sets - 12 reps - 3 hold - Standing March with Counter Support  - 1 x  daily - 3 x weekly - 2 sets - 10 reps - Standing Hip Abduction with Counter Support  - 1 x daily - 3 x weekly - 2 sets - 10 reps - Standing Hip Extension with Counter Support  - 1 x daily - 3 x weekly - 2 sets - 10 reps - Mini Squat with Counter Support  - 1 x daily - 3 x weekly - 2 sets - 10 reps - Standing Knee Flexion with Counter Support  - 1 x daily - 3 x weekly - 2 sets - 10 reps - Standing Tandem Balance with Counter  Support  - 1 x daily - 3 x weekly - 2 sets - 4 reps - 10 hold  GOALS: Goals reviewed with patient? Yes  SHORT TERM GOALS: Target date: 12/28/23  Patient will be independent in home exercise program to improve strength/mobility for better functional independence with ADLs. Baseline:to be given at next session; 10/23/2023- Patient reports independence with HEP provided and no significant issues.   Goal status: MET 2. Pt will be able to ambulate for at least 5 min without stopping with LRAD to allow improved access to community and return to PLOF  Baseline: 3 minutes. 2/11: 6 minute walk test completed.   Goal Status: MET  . LONG TERM GOALS: Target date: 07/16/2024  Patient will increase FOTO score to equal to or greater than 61 to demonstrate statistically significant improvement in mobility and quality of life.  Baseline: 56; 10/23/2023= 60 Goal status: PROGRESSING/DISCONTINUED   2.  Patient (> 62 years old) will complete five times sit to stand test in < 15 seconds  without UE support indicating an increased LE strength and improved balance. Baseline: 30.86 sec; 10/23/2023= 22.08 using 1 UE support.  12/11: 14.67 sec BUE from arm rest. And 14.12 with 1 UE pushing from arm rest. 01/30/24 13 seconds with UE  3/26: 15.0  able to achieve full stand on each repetition  Goal status: MET  3.  Patient will increase Berg Balance score to >45 points to demonstrate decreased fall risk during functional activities  Baseline: 26; 10/23/2023= 30 12/11: 34 01/30/24: 41/56 3/26: 40/56 04/23/2024= 44/56 06/20/2024= 46/56 Goal status: Will keep goal active to ensure consistent.   4.  Patient will increase 10 meter walk test to >1.84m/s as to improve gait speed for better community ambulation and to reduce fall risk. Baseline: 0.630m/s; 10/23/2023= 0.88 m/s (normal speed with 4WW) 12/11: Average Fast speed: 1.05 m/s Average Normal speed: 0.834 m/s 01/30/24: 1.14 m/s 3/26: 1.0 m/s Goal status: MET  5.   Patient will reduce timed up and go to <11 seconds to reduce fall risk and demonstrate improved transfer/gait ability. Baseline: 29.39 12/2: 16.4 sec with Rollator  12/11: 15.19 sec with rollator  2/4: 17.38 sec with rollator  3/4: 15 sec with rollator  3/26: 13.76 sec with rollator  04/23/2024= 12.34 sec avg with SPC Goal status: IN PROGRESS  6.  Patient will 2 min walk test by at least 25ft as to demonstrate reduced fall risk and improved dynamic gait balance for better safety with community/home ambulation.  Baseline: 284ft; 10/23/2023= 360 feet  with 4WW 349ft with 4WW Goal status: MET  7. Pt will complete 6 min walk test without stopping and improve overall score by at least 165ft to indicate increased access to community.  Baseline: to be complete 2/11:880 ft with RW; 01/30/24: 1168 ft with RW  3/26: 11101ft with Rollator  06/20/2024= 1180 feet with Rollator  Goal: MET   ASSESSMENT: CLINICAL IMPRESSION:  Patient returns to PT for progress report visit today including some reassessment of LTG's. She performed well and continues to make some steady progress despite her scoliosis. She improved 2 points on BERG- and demonstrating a good walking speed. She also demonstrated improved 6 min walk test- a little below 90-89 y/o female but no norms for 90+. Patient's condition has the potential to improve in response to therapy. Maximum improvement is yet to be obtained. The anticipated improvement is attainable and reasonable in a generally predictable time.  Patient reports   Ms. Laseter will continue to benefit from skilled physical therapy intervention to address stated impairments, improve QOL, decrease fall risk, and attain therapy goals.   OBJECTIVE IMPAIRMENTS: Abnormal gait, cardiopulmonary status limiting activity, decreased activity tolerance, decreased balance, decreased endurance, decreased mobility, difficulty walking, decreased ROM, decreased strength, decreased safety awareness,  hypomobility, increased fascial restrictions, impaired flexibility, improper body mechanics, and postural dysfunction.   ACTIVITY LIMITATIONS: lifting, standing, squatting, transfers, and locomotion level  PARTICIPATION LIMITATIONS: laundry, shopping, and community activity  PERSONAL FACTORS: 3+ comorbidities: scoliosis, hx of CA, and PE are also affecting patient's functional outcome.   REHAB POTENTIAL: Good  CLINICAL DECISION MAKING: Stable/uncomplicated  EVALUATION COMPLEXITY: Moderate  PLAN:  PT FREQUENCY: 1-2x/week  PT DURATION: 12 weeks  PLANNED INTERVENTIONS: Therapeutic exercises, Therapeutic activity, Neuromuscular re-education, Balance training, Gait training, Patient/Family education, Self Care, Joint mobilization, Stair training, Vestibular training, DME instructions, Dry Needling, Spinal mobilization, Cryotherapy, Moist heat, and Manual therapy  PLAN FOR NEXT SESSION:  Continue Dynamic balance and gait training. Progressing to higher level variable direction stepping and reaching tasks. BLE and postural strengthening.   Chyrl London, PT Physical Therapist - [facility]  3:42 PM 06/25/24

## 2024-06-27 ENCOUNTER — Ambulatory Visit

## 2024-06-27 DIAGNOSIS — R262 Difficulty in walking, not elsewhere classified: Secondary | ICD-10-CM

## 2024-06-27 DIAGNOSIS — R278 Other lack of coordination: Secondary | ICD-10-CM

## 2024-06-27 DIAGNOSIS — M6281 Muscle weakness (generalized): Secondary | ICD-10-CM

## 2024-06-27 DIAGNOSIS — R2689 Other abnormalities of gait and mobility: Secondary | ICD-10-CM

## 2024-06-27 DIAGNOSIS — R2681 Unsteadiness on feet: Secondary | ICD-10-CM

## 2024-06-27 DIAGNOSIS — M5459 Other low back pain: Secondary | ICD-10-CM

## 2024-06-27 NOTE — Therapy (Signed)
 OUTPATIENT PHYSICAL THERAPY TREATMENT   Patient Name: Kristina Rowe MRN: [id number] DOB:08-14-1930, 89 y.o., female Today's Date: 06/27/2024  PCP: Sherial Bail, MD  REFERRING PROVIDER:   Suzzane Charleston, PA-C   END OF SESSION:   PT End of Session - 06/27/24 1536     Visit Number 52    Number of Visits 59    Date for PT Re-Evaluation 07/16/24    Progress Note Due on Visit 60    PT Start Time 1532    PT Stop Time 1612    PT Time Calculation (min) 40 min    Equipment Utilized During Treatment Gait belt    Activity Tolerance Patient tolerated treatment well;No increased pain    Behavior During Therapy WFL for tasks assessed/performed             Past Medical History:  Diagnosis Date   Ductal carcinoma in situ (DCIS) of left breast    Dysphagia    Esophageal stricture    Gastroparesis    GERD (gastroesophageal reflux disease)    Hiatal hernia    Hypertension    Iron deficiency anemia    Osteoporosis    Pulmonary embolism (HCC)    Scoliosis    UTI (urinary tract infection)    Past Surgical History:  Procedure Laterality Date   BREAST LUMPECTOMY Left 2009   Ductal Carcinoma insitu    CATARACT EXTRACTION, BILATERAL     COLONOSCOPY N/A 03/17/2021   Procedure: COLONOSCOPY;  Surgeon: Maryruth Ole DASEN, MD;  Location: [facility];  Service: Endoscopy;  Laterality: N/A;   COLOSTOMY REVISION Right 03/18/2021   Procedure: COLON RESECTION RIGHT;  Surgeon: Desiderio Schanz, MD;  Location: [facility];  Service: General;  Laterality: Right;   LAPAROSCOPIC PARAESOPHAGEAL HERNIA REPAIR  2009   LAPAROTOMY N/A 03/10/2021   Procedure: EXPLORATORY LAPAROTOMY;  Surgeon: Desiderio Schanz, MD;  Location: [facility];  Service: General;  Laterality: N/A;   TUBAL LIGATION     Patient Active Problem List   Diagnosis Date Noted   Acute on chronic diastolic CHF (congestive heart failure) (HCC) 08/19/2023   Acute respiratory failure with hypoxia (HCC) 08/17/2023   History of pulmonary  embolus (PE) 08/17/2023   Basal cell carcinoma of leg 04/01/2021   Coronary atherosclerosis 04/01/2021   History of pulmonary embolism 04/01/2021   Pulmonary nodule 04/01/2021   Malignant neoplasm of colon (HCC) 03/29/2021   Rectal bleeding 03/15/2021   Hypertension    GERD (gastroesophageal reflux disease)    Colonic mass    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HCC) 03/10/2021   Anemia, unspecified 03/06/2021   Bronchiectasis without complication (HCC) 03/27/2020   Iron deficiency anemia 03/27/2020   Moderate aortic stenosis 03/27/2020   Stage 3a chronic kidney disease (HCC) 07/04/2019   Hypnic headache 05/28/2019   Telogen effluvium 04/17/2019   Xerosis cutis 04/17/2019   Essential hypertension 01/14/2019   Cervical radiculopathy 05/04/2018   Osteoarthritis of left glenohumeral joint 05/04/2018   Vitamin B12 deficiency 01/01/2018   Vascular abnormality 09/11/2017   Dyspepsia 03/28/2017   Pulmonary embolism (HCC) 08/16/2016   Dilated pore of [place] 03/23/2015   Retinal drusen of both eyes 08/28/2014   Status post right cataract extraction 07/30/2014   Verruca 03/19/2014   Chronic back pain 11/06/2013   Postmenopausal osteoporosis 06/11/2013   Anticoagulated on Coumadin  04/06/2013   Long term (current) use of anticoagulants 10/18/2012   Preglaucoma 05/10/2012   Presbyopia 05/10/2012   Senile nuclear sclerosis 05/10/2012   Lobular carcinoma in situ of breast 04/13/2012  Closed fracture of ankle 02/27/2012   History of nonmelanoma skin cancer 09/05/2011   Other benign neoplasm of skin of trunk 09/05/2011   Osteoporosis 08/25/2011   Transient alteration of awareness 09/03/2009   Colon adenoma 07/30/2002    ONSET DATE: 3 months   REFERRING DIAG:  Diagnosis  R53.83 (ICD-10-CM) - Other fatigue    THERAPY DIAG:  Muscle weakness (generalized)  Difficulty in walking, not elsewhere classified  Unsteadiness on feet  Other abnormalities of gait and mobility  Other low back  pain  Other lack of coordination  Rationale for Evaluation and Treatment: Rehabilitation  SUBJECTIVE:                                                                                                                                                                                             SUBJECTIVE STATEMENT:   Pt reports having a pretty good week so far and denies any falls.   Pt accompanied by: self  PERTINENT HISTORY:  From recent MD visit.  89 y.o. here for an acute issue. She has a PMH of chronic PE/Coumadin , anemia, colon cancer, breast cancer, large hiatal hernia, moderate aortic stenosis who has concerns about shortness of breath, nausea, generalized weakness. No chest pain. History of hiatal hernia and recent CT chest. Also recent endoscopy. Takes Zofran  chronically. Has felt weaker and almost fell recently. Uses a walker. Also on chronic UTI suppression, Keflex .   PAIN:  Are you having pain?  Yes, typical left tlow back pain 8/10  PRECAUTIONS: Fall  RED FLAGS: None   WEIGHT BEARING RESTRICTIONS: No  FALLS: Has patient fallen in last 6 months? No  LIVING ENVIRONMENT: Lives with: lives alone Lives in: House/apartment Stairs: Yes: External: 1 steps; none Has following equipment at home: Walker - 4 wheeled  PLOF: Independent with household mobility with device and Independent with community mobility with device  PATIENT GOALS: improved strength in BLE   OBJECTIVE:   DIAGNOSTIC FINDINGS:  06/12/2023 EXAM: CT CHEST, ABDOMEN, AND PELVIS WITH CONTRAST IMPRESSION: 1. Status post right hemicolectomy, without recurrent or metastatic disease. Decreased sensitivity exam, primarily involving the abdomen pelvis, due to paucity of fat. 2. New tiny left groin hernia containing fluid. Consider physical exam correlation. 3. Incidental findings, including: Coronary artery atherosclerosis. Aortic Atherosclerosis (ICD10-I70.0). Dilated esophagus with contrast throughout,  suggesting dysmotility and/or gastroesophageal reflux.  COGNITION: Overall cognitive status: Within functional limits for tasks assessed   SENSATION: Light touch: Impaired  mild paraesthesia in the proximal RLE   COORDINATION: WFL. Ankle to knee limited ROM due to weakness in the R hip      POSTURE: rounded  shoulders, forward head, flexed trunk , and severe scoliosis     LOWER EXTREMITY MMT:    MMT Right Eval Left Eval  Hip flexion 4- 4  Hip extension    Hip abduction 4- 44  Hip adduction 4 4  Hip internal rotation    Hip external rotation    Knee flexion 4- 4  Knee extension 4 4+  Ankle dorsiflexion 4 4+  Ankle plantarflexion    Ankle inversion    Ankle eversion    (Blank rows = not tested)    TRANSFERS: Assistive device utilized: Environmental consultant - 4 wheeled  Sit to stand: SBA Stand to sit: SBA Chair to chair: SBA Floor: Unable to perform    STAIRS: Level of Assistance: pt declines Stair Negotiation Technique: Step to Pattern with Bilateral Rails Number of Stairs: 4  Height of Stairs: 6  Comments: will need to assess.   GAIT: Gait pattern: step through pattern, Right foot flat, and lateral hip instability Distance walked: 233ft Assistive device utilized: Walker - 4 wheeled Level of assistance: Modified independence and SBA Comments: severe scoliosis.    FUNCTIONAL TESTS:  5 times sit to stand: 30.86 Timed up and go (TUG): 29.39 2 minute walk test: 218ft  10 meter walk test: 0.68m/s, SOB 4-5.  Berg Balance Scale: TBD  PATIENT SURVEYS:  FOTO 56. goal 64  TODAY'S TREATMENT 06/27/24   NMR:   Postural awareness:  Scap retract: standing with GTB 2 x 15 reps  Should ext: BUE with GTB 2 x 15 reps   -Dynamic walking at support bar x 6 feet - fwd/back without AD- Close CGA- x 10 (counting steps - fwd at best =6; bwd at best =8)    High knee march in place at support bar- x 20 reps alt LE (VC to slow down) - (mild unsteadiness yet able to improve)    Tandem Standing: - Hold 30 sec x 3 each LE    TA:  Ambulation with 4WW- CGA  - gait speed at 1.1 m/s using rollator.   Sit to stand from high mat (approx 24 in ) without UE support x 12   Mini lunges in // bars - down and back x 5 with very minimal UE support         PATIENT EDUCATION: Education details: Pt educated throughout session about proper posture and technique with exercises. Improved exercise technique, movement at target joints, use of target muscles after min to mod verbal, visual, tactile cues. Person educated: Patient Education method: Explanation, demo, VC Education comprehension: verbalized understanding and returned demonstration  HOME EXERCISE PROGRAM: Access Code: [email or web address] URL: [email or web address] Date: 05/30/2024 Prepared by: Peggye Linear  Exercises - Side Stepping with Counter Support  - 2 sets - 30 reps - Heel Raises with Counter Support  - 3 sets - 15 reps - Standing March with Counter Support  - 2 sets - 30 reps - Standing Tandem Balance with Unilateral Counter Support  - 3 sets - 10 reps - 15 hold  Access Code: [email or web address] URL: [email or web address] Date: 09/25/2023 Prepared by: Massie Dollar  Exercises - Seated Hip Abduction with Resistance  - 1 x daily - 4 x weekly - 3 sets - 15 reps - 3 hold - Seated March with Resistance  - 1 x daily - 4 x weekly - 3 sets - 10 reps - 1 hold - Seated Long Arc Quad  - 1 x daily - 4 x weekly - 3 sets - 12 reps - 3 hold -  Seated Hip Adduction Isometrics with Ball  - 1 x daily - 4 x weekly - 3 sets - 12 reps - 3 hold - Standing March with Counter Support  - 1 x daily - 3 x weekly - 2 sets - 10 reps - Standing Hip Abduction with Counter Support  - 1 x daily - 3 x weekly - 2 sets - 10 reps - Standing Hip Extension with Counter Support  - 1 x daily - 3 x weekly - 2 sets - 10 reps - Mini Squat with Counter Support  - 1 x daily - 3 x weekly - 2 sets - 10 reps - Standing Knee Flexion  with Counter Support  - 1 x daily - 3 x weekly - 2 sets - 10 reps - Standing Tandem Balance with Counter Support  - 1 x daily - 3 x weekly - 2 sets - 4 reps - 10 hold  GOALS: Goals reviewed with patient? Yes  SHORT TERM GOALS: Target date: 12/28/23  Patient will be independent in home exercise program to improve strength/mobility for better functional independence with ADLs. Baseline:to be given at next session; 10/23/2023- Patient reports independence with HEP provided and no significant issues.   Goal status: MET 2. Pt will be able to ambulate for at least 5 min without stopping with LRAD to allow improved access to community and return to PLOF  Baseline: 3 minutes. 2/11: 6 minute walk test completed.   Goal Status: MET  . LONG TERM GOALS: Target date: 07/16/2024  Patient will increase FOTO score to equal to or greater than 61 to demonstrate statistically significant improvement in mobility and quality of life.  Baseline: 56; 10/23/2023= 60 Goal status: PROGRESSING/DISCONTINUED   2.  Patient (> 65 years old) will complete five times sit to stand test in < 15 seconds  without UE support indicating an increased LE strength and improved balance. Baseline: 30.86 sec; 10/23/2023= 22.08 using 1 UE support.  12/11: 14.67 sec BUE from arm rest. And 14.12 with 1 UE pushing from arm rest. 01/30/24 13 seconds with UE  3/26: 15.0  able to achieve full stand on each repetition  Goal status: MET  3.  Patient will increase Berg Balance score to >45 points to demonstrate decreased fall risk during functional activities  Baseline: 26; 10/23/2023= 30 12/11: 34 01/30/24: 41/56 3/26: 40/56 04/23/2024= 44/56 06/20/2024= 46/56 Goal status: Will keep goal active to ensure consistent.   4.  Patient will increase 10 meter walk test to >1.66m/s as to improve gait speed for better community ambulation and to reduce fall risk. Baseline: 0.661m/s; 10/23/2023= 0.88 m/s (normal speed with 4WW) 12/11: Average Fast  speed: 1.05 m/s Average Normal speed: 0.834 m/s 01/30/24: 1.14 m/s 3/26: 1.0 m/s Goal status: MET  5.  Patient will reduce timed up and go to <11 seconds to reduce fall risk and demonstrate improved transfer/gait ability. Baseline: 29.39 12/2: 16.4 sec with Rollator  12/11: 15.19 sec with rollator  2/4: 17.38 sec with rollator  3/4: 15 sec with rollator  3/26: 13.76 sec with rollator  04/23/2024= 12.34 sec avg with SPC Goal status: IN PROGRESS  6.  Patient will 2 min walk test by at least 33ft as to demonstrate reduced fall risk and improved dynamic gait balance for better safety with community/home ambulation.  Baseline: 267ft; 10/23/2023= 360 feet  with 4WW 349ft with 4WW Goal status: MET  7. Pt will complete 6 min walk test without stopping and improve overall score by at  least 114ft to indicate increased access to community.  Baseline: to be complete 2/11:880 ft with RW; 01/30/24: 1168 ft with RW  3/26: 1155ft with Rollator  06/20/2024= 1180 feet with Rollator  Goal: MET   ASSESSMENT: CLINICAL IMPRESSION:  Patient challenged with standing posture activities today- some VC for correct technique. She responded well to walking today and ambulating with faster gait speed overall placing her at decreased risk for falling. She continues to endorse fatigue at end of session but to date- denies increased pain.   Kristina Rowe will continue to benefit from skilled physical therapy intervention to address stated impairments, improve QOL, decrease fall risk, and attain therapy goals.   OBJECTIVE IMPAIRMENTS: Abnormal gait, cardiopulmonary status limiting activity, decreased activity tolerance, decreased balance, decreased endurance, decreased mobility, difficulty walking, decreased ROM, decreased strength, decreased safety awareness, hypomobility, increased fascial restrictions, impaired flexibility, improper body mechanics, and postural dysfunction.   ACTIVITY LIMITATIONS: lifting, standing,  squatting, transfers, and locomotion level  PARTICIPATION LIMITATIONS: laundry, shopping, and community activity  PERSONAL FACTORS: 3+ comorbidities: scoliosis, hx of CA, and PE are also affecting patient's functional outcome.   REHAB POTENTIAL: Good  CLINICAL DECISION MAKING: Stable/uncomplicated  EVALUATION COMPLEXITY: Moderate  PLAN:  PT FREQUENCY: 1-2x/week  PT DURATION: 12 weeks  PLANNED INTERVENTIONS: Therapeutic exercises, Therapeutic activity, Neuromuscular re-education, Balance training, Gait training, Patient/Family education, Self Care, Joint mobilization, Stair training, Vestibular training, DME instructions, Dry Needling, Spinal mobilization, Cryotherapy, Moist heat, and Manual therapy  PLAN FOR NEXT SESSION:  Continue Dynamic balance and gait training. Progressing to higher level variable direction stepping and reaching tasks. BLE and postural strengthening.   Chyrl London, PT Physical Therapist - [place]  [facility]  5:27 PM 06/27/24

## 2024-07-02 ENCOUNTER — Ambulatory Visit

## 2024-07-04 ENCOUNTER — Ambulatory Visit: Attending: Physician Assistant

## 2024-07-04 DIAGNOSIS — R262 Difficulty in walking, not elsewhere classified: Secondary | ICD-10-CM | POA: Diagnosis present

## 2024-07-04 DIAGNOSIS — R2681 Unsteadiness on feet: Secondary | ICD-10-CM | POA: Insufficient documentation

## 2024-07-04 DIAGNOSIS — R278 Other lack of coordination: Secondary | ICD-10-CM | POA: Insufficient documentation

## 2024-07-04 DIAGNOSIS — R2689 Other abnormalities of gait and mobility: Secondary | ICD-10-CM | POA: Diagnosis present

## 2024-07-04 DIAGNOSIS — M5459 Other low back pain: Secondary | ICD-10-CM | POA: Insufficient documentation

## 2024-07-04 DIAGNOSIS — M6281 Muscle weakness (generalized): Secondary | ICD-10-CM | POA: Diagnosis present

## 2024-07-04 NOTE — Therapy (Unsigned)
 OUTPATIENT PHYSICAL THERAPY TREATMENT   Patient Name: Kristina Rowe MRN: [id number] DOB:05-Nov-1930, 89 y.o., female Today's Date: 07/05/2024  PCP: Sherial Bail, MD  REFERRING PROVIDER:   Suzzane Charleston, PA-C   END OF SESSION:   PT End of Session - 07/04/24 1535     Visit Number 53    Number of Visits 59    Date for PT Re-Evaluation 07/16/24    Progress Note Due on Visit 60    PT Start Time 1531    PT Stop Time 1613    PT Time Calculation (min) 42 min    Equipment Utilized During Treatment Gait belt    Activity Tolerance Patient tolerated treatment well;No increased pain    Behavior During Therapy WFL for tasks assessed/performed             Past Medical History:  Diagnosis Date   Ductal carcinoma in situ (DCIS) of left breast    Dysphagia    Esophageal stricture    Gastroparesis    GERD (gastroesophageal reflux disease)    Hiatal hernia    Hypertension    Iron deficiency anemia    Osteoporosis    Pulmonary embolism (HCC)    Scoliosis    UTI (urinary tract infection)    Past Surgical History:  Procedure Laterality Date   BREAST LUMPECTOMY Left 2009   Ductal Carcinoma insitu    CATARACT EXTRACTION, BILATERAL     COLONOSCOPY N/A 03/17/2021   Procedure: COLONOSCOPY;  Surgeon: Maryruth Ole DASEN, MD;  Location: [facility];  Service: Endoscopy;  Laterality: N/A;   COLOSTOMY REVISION Right 03/18/2021   Procedure: COLON RESECTION RIGHT;  Surgeon: Desiderio Schanz, MD;  Location: [facility];  Service: General;  Laterality: Right;   LAPAROSCOPIC PARAESOPHAGEAL HERNIA REPAIR  2009   LAPAROTOMY N/A 03/10/2021   Procedure: EXPLORATORY LAPAROTOMY;  Surgeon: Desiderio Schanz, MD;  Location: [facility];  Service: General;  Laterality: N/A;   TUBAL LIGATION     Patient Active Problem List   Diagnosis Date Noted   Acute on chronic diastolic CHF (congestive heart failure) (HCC) 08/19/2023   Acute respiratory failure with hypoxia (HCC) 08/17/2023   History of pulmonary  embolus (PE) 08/17/2023   Basal cell carcinoma of leg 04/01/2021   Coronary atherosclerosis 04/01/2021   History of pulmonary embolism 04/01/2021   Pulmonary nodule 04/01/2021   Malignant neoplasm of colon (HCC) 03/29/2021   Rectal bleeding 03/15/2021   Hypertension    GERD (gastroesophageal reflux disease)    Colonic mass    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HCC) 03/10/2021   Anemia, unspecified 03/06/2021   Bronchiectasis without complication (HCC) 03/27/2020   Iron deficiency anemia 03/27/2020   Moderate aortic stenosis 03/27/2020   Stage 3a chronic kidney disease (HCC) 07/04/2019   Hypnic headache 05/28/2019   Telogen effluvium 04/17/2019   Xerosis cutis 04/17/2019   Essential hypertension 01/14/2019   Cervical radiculopathy 05/04/2018   Osteoarthritis of left glenohumeral joint 05/04/2018   Vitamin B12 deficiency 01/01/2018   Vascular abnormality 09/11/2017   Dyspepsia 03/28/2017   Pulmonary embolism (HCC) 08/16/2016   Dilated pore of [place] 03/23/2015   Retinal drusen of both eyes 08/28/2014   Status post right cataract extraction 07/30/2014   Verruca 03/19/2014   Chronic back pain 11/06/2013   Postmenopausal osteoporosis 06/11/2013   Anticoagulated on Coumadin  04/06/2013   Long term (current) use of anticoagulants 10/18/2012   Preglaucoma 05/10/2012   Presbyopia 05/10/2012   Senile nuclear sclerosis 05/10/2012   Lobular carcinoma in situ of breast 04/13/2012  Closed fracture of ankle 02/27/2012   History of nonmelanoma skin cancer 09/05/2011   Other benign neoplasm of skin of trunk 09/05/2011   Osteoporosis 08/25/2011   Transient alteration of awareness 09/03/2009   Colon adenoma 07/30/2002    ONSET DATE: 3 months   REFERRING DIAG:  Diagnosis  R53.83 (ICD-10-CM) - Other fatigue    THERAPY DIAG:  Muscle weakness (generalized)  Difficulty in walking, not elsewhere classified  Unsteadiness on feet  Other abnormalities of gait and mobility  Other low back  pain  Other lack of coordination  Rationale for Evaluation and Treatment: Rehabilitation  SUBJECTIVE:                                                                                                                                                                                             SUBJECTIVE STATEMENT:   Pt reports having a pretty good week so far and denies any falls.   Pt accompanied by: self  PERTINENT HISTORY:  From recent MD visit.  89 y.o. here for an acute issue. She has a PMH of chronic PE/Coumadin , anemia, colon cancer, breast cancer, large hiatal hernia, moderate aortic stenosis who has concerns about shortness of breath, nausea, generalized weakness. No chest pain. History of hiatal hernia and recent CT chest. Also recent endoscopy. Takes Zofran  chronically. Has felt weaker and almost fell recently. Uses a walker. Also on chronic UTI suppression, Keflex .   PAIN:  Are you having pain?  Yes, typical left tlow back pain 8/10  PRECAUTIONS: Fall  RED FLAGS: None   WEIGHT BEARING RESTRICTIONS: No  FALLS: Has patient fallen in last 6 months? No  LIVING ENVIRONMENT: Lives with: lives alone Lives in: House/apartment Stairs: Yes: External: 1 steps; none Has following equipment at home: Walker - 4 wheeled  PLOF: Independent with household mobility with device and Independent with community mobility with device  PATIENT GOALS: improved strength in BLE   OBJECTIVE:   DIAGNOSTIC FINDINGS:  06/12/2023 EXAM: CT CHEST, ABDOMEN, AND PELVIS WITH CONTRAST IMPRESSION: 1. Status post right hemicolectomy, without recurrent or metastatic disease. Decreased sensitivity exam, primarily involving the abdomen pelvis, due to paucity of fat. 2. New tiny left groin hernia containing fluid. Consider physical exam correlation. 3. Incidental findings, including: Coronary artery atherosclerosis. Aortic Atherosclerosis (ICD10-I70.0). Dilated esophagus with contrast throughout,  suggesting dysmotility and/or gastroesophageal reflux.  COGNITION: Overall cognitive status: Within functional limits for tasks assessed   SENSATION: Light touch: Impaired  mild paraesthesia in the proximal RLE   COORDINATION: WFL. Ankle to knee limited ROM due to weakness in the R hip      POSTURE: rounded  shoulders, forward head, flexed trunk , and severe scoliosis     LOWER EXTREMITY MMT:    MMT Right Eval Left Eval  Hip flexion 4- 4  Hip extension    Hip abduction 4- 44  Hip adduction 4 4  Hip internal rotation    Hip external rotation    Knee flexion 4- 4  Knee extension 4 4+  Ankle dorsiflexion 4 4+  Ankle plantarflexion    Ankle inversion    Ankle eversion    (Blank rows = not tested)    TRANSFERS: Assistive device utilized: Environmental consultant - 4 wheeled  Sit to stand: SBA Stand to sit: SBA Chair to chair: SBA Floor: Unable to perform    STAIRS: Level of Assistance: pt declines Stair Negotiation Technique: Step to Pattern with Bilateral Rails Number of Stairs: 4  Height of Stairs: 6  Comments: will need to assess.   GAIT: Gait pattern: step through pattern, Right foot flat, and lateral hip instability Distance walked: 281ft Assistive device utilized: Walker - 4 wheeled Level of assistance: Modified independence and SBA Comments: severe scoliosis.    FUNCTIONAL TESTS:  5 times sit to stand: 30.86 Timed up and go (TUG): 29.39 2 minute walk test: 247ft  10 meter walk test: 0.670m/s, SOB 4-5.  Berg Balance Scale: TBD  PATIENT SURVEYS:  FOTO 56. goal 64  TODAY'S TREATMENT 07/04/24   NMR:   Step tap onto 6 block with alt LE and UE support x 20 reps. Patient present with good cadence and no difficulty with feet clearing steps today.   Postural awareness:  Scap retract: standing with GTB 2 x 15 reps  Should ext: BUE with GTB 2 x 15 reps  Horizontal Shoulder ABD- YTB 2 x 15 reps Shoulder D2 flex YTB x 15 reps   TA:   Step up x 15 with BUE  support   Ambulation with 4WW- CGA  x 600 feet  - gait speed at 1.2  m/s using rollator.   Sit to stand with UE support + BUE Shoulder flex x 15 reps            PATIENT EDUCATION: Education details: Pt educated throughout session about proper posture and technique with exercises. Improved exercise technique, movement at target joints, use of target muscles after min to mod verbal, visual, tactile cues. Person educated: Patient Education method: Explanation, demo, VC Education comprehension: verbalized understanding and returned demonstration  HOME EXERCISE PROGRAM: Access Code: [email or web address] URL: [email or web address] Date: 05/30/2024 Prepared by: Peggye Linear  Exercises - Side Stepping with Counter Support  - 2 sets - 30 reps - Heel Raises with Counter Support  - 3 sets - 15 reps - Standing March with Counter Support  - 2 sets - 30 reps - Standing Tandem Balance with Unilateral Counter Support  - 3 sets - 10 reps - 15 hold  Access Code: [email or web address] URL: [email or web address] Date: 09/25/2023 Prepared by: Massie Dollar  Exercises - Seated Hip Abduction with Resistance  - 1 x daily - 4 x weekly - 3 sets - 15 reps - 3 hold - Seated March with Resistance  - 1 x daily - 4 x weekly - 3 sets - 10 reps - 1 hold - Seated Long Arc Quad  - 1 x daily - 4 x weekly - 3 sets - 12 reps - 3 hold - Seated Hip Adduction Isometrics with Ball  - 1 x daily - 4 x weekly - 3 sets - 12 reps - 3 hold -  Standing March with Counter Support  - 1 x daily - 3 x weekly - 2 sets - 10 reps - Standing Hip Abduction with Counter Support  - 1 x daily - 3 x weekly - 2 sets - 10 reps - Standing Hip Extension with Counter Support  - 1 x daily - 3 x weekly - 2 sets - 10 reps - Mini Squat with Counter Support  - 1 x daily - 3 x weekly - 2 sets - 10 reps - Standing Knee Flexion with Counter Support  - 1 x daily - 3 x weekly - 2 sets - 10 reps - Standing Tandem Balance with Counter Support  -  1 x daily - 3 x weekly - 2 sets - 4 reps - 10 hold  GOALS: Goals reviewed with patient? Yes  SHORT TERM GOALS: Target date: 12/28/23  Patient will be independent in home exercise program to improve strength/mobility for better functional independence with ADLs. Baseline:to be given at next session; 10/23/2023- Patient reports independence with HEP provided and no significant issues.   Goal status: MET 2. Pt will be able to ambulate for at least 5 min without stopping with LRAD to allow improved access to community and return to PLOF  Baseline: 3 minutes. 2/11: 6 minute walk test completed.   Goal Status: MET  . LONG TERM GOALS: Target date: 07/16/2024  Patient will increase FOTO score to equal to or greater than 61 to demonstrate statistically significant improvement in mobility and quality of life.  Baseline: 56; 10/23/2023= 60 Goal status: PROGRESSING/DISCONTINUED   2.  Patient (> 30 years old) will complete five times sit to stand test in < 15 seconds  without UE support indicating an increased LE strength and improved balance. Baseline: 30.86 sec; 10/23/2023= 22.08 using 1 UE support.  12/11: 14.67 sec BUE from arm rest. And 14.12 with 1 UE pushing from arm rest. 01/30/24 13 seconds with UE  3/26: 15.0  able to achieve full stand on each repetition  Goal status: MET  3.  Patient will increase Berg Balance score to >45 points to demonstrate decreased fall risk during functional activities  Baseline: 26; 10/23/2023= 30 12/11: 34 01/30/24: 41/56 3/26: 40/56 04/23/2024= 44/56 06/20/2024= 46/56 Goal status: Will keep goal active to ensure consistent.   4.  Patient will increase 10 meter walk test to >1.70m/s as to improve gait speed for better community ambulation and to reduce fall risk. Baseline: 0.671m/s; 10/23/2023= 0.88 m/s (normal speed with 4WW) 12/11: Average Fast speed: 1.05 m/s Average Normal speed: 0.834 m/s 01/30/24: 1.14 m/s 3/26: 1.0 m/s Goal status: MET  5.  Patient will  reduce timed up and go to <11 seconds to reduce fall risk and demonstrate improved transfer/gait ability. Baseline: 29.39 12/2: 16.4 sec with Rollator  12/11: 15.19 sec with rollator  2/4: 17.38 sec with rollator  3/4: 15 sec with rollator  3/26: 13.76 sec with rollator  04/23/2024= 12.34 sec avg with SPC Goal status: IN PROGRESS  6.  Patient will 2 min walk test by at least 57ft as to demonstrate reduced fall risk and improved dynamic gait balance for better safety with community/home ambulation.  Baseline: 264ft; 10/23/2023= 360 feet  with 4WW 365ft with 4WW Goal status: MET  7. Pt will complete 6 min walk test without stopping and improve overall score by at least 110ft to indicate increased access to community.  Baseline: to be complete 2/11:880 ft with RW; 01/30/24: 1168 ft with RW  3/26: 1111ft  with Rollator  06/20/2024= 1180 feet with Rollator  Goal: MET   ASSESSMENT: CLINICAL IMPRESSION:  Patient able to progress with reps and resistance with postural activities without any significant complaint of pain. She was responsive to all VC- able to improve her technique in session with Author providing VC and visual demonstration with all resistive posture activities for correct and safe use of theraband. She was able to walk well again today with good cadence/gait speed and despite her kyphotic posture- able to stay within walker well overall today for 600 feet.  Ms. Bujak will continue to benefit from skilled physical therapy intervention to address stated impairments, improve QOL, decrease fall risk, and attain therapy goals.   OBJECTIVE IMPAIRMENTS: Abnormal gait, cardiopulmonary status limiting activity, decreased activity tolerance, decreased balance, decreased endurance, decreased mobility, difficulty walking, decreased ROM, decreased strength, decreased safety awareness, hypomobility, increased fascial restrictions, impaired flexibility, improper body mechanics, and postural  dysfunction.   ACTIVITY LIMITATIONS: lifting, standing, squatting, transfers, and locomotion level  PARTICIPATION LIMITATIONS: laundry, shopping, and community activity  PERSONAL FACTORS: 3+ comorbidities: scoliosis, hx of CA, and PE are also affecting patient's functional outcome.   REHAB POTENTIAL: Good  CLINICAL DECISION MAKING: Stable/uncomplicated  EVALUATION COMPLEXITY: Moderate  PLAN:  PT FREQUENCY: 1-2x/week  PT DURATION: 12 weeks  PLANNED INTERVENTIONS: Therapeutic exercises, Therapeutic activity, Neuromuscular re-education, Balance training, Gait training, Patient/Family education, Self Care, Joint mobilization, Stair training, Vestibular training, DME instructions, Dry Needling, Spinal mobilization, Cryotherapy, Moist heat, and Manual therapy  PLAN FOR NEXT SESSION:  Continue Dynamic balance and gait training. Progressing to higher level variable direction stepping and reaching tasks. BLE and postural strengthening.   Chyrl London, PT Physical Therapist - [place]  [facility]  8:33 AM 07/05/24

## 2024-07-08 ENCOUNTER — Ambulatory Visit
Admission: RE | Admit: 2024-07-08 | Discharge: 2024-07-08 | Disposition: A | Discharge status: Home / Self Care | Payer: Medicare Other | Source: Ambulatory Visit | Attending: Oncology | Admitting: Oncology

## 2024-07-08 DIAGNOSIS — Z85038 Personal history of other malignant neoplasm of large intestine: Secondary | ICD-10-CM | POA: Diagnosis present

## 2024-07-08 DIAGNOSIS — D508 Other iron deficiency anemias: Secondary | ICD-10-CM | POA: Diagnosis present

## 2024-07-08 DIAGNOSIS — Z08 Encounter for follow-up examination after completed treatment for malignant neoplasm: Secondary | ICD-10-CM | POA: Diagnosis present

## 2024-07-08 MED ORDER — IOHEXOL 240 MG/ML SOLN
50.0000 mL | Freq: Once | INTRAMUSCULAR | Status: AC | PRN
  Administered 2024-07-08 (×2): 50 mL via ORAL

## 2024-07-09 ENCOUNTER — Inpatient Hospital Stay

## 2024-07-09 ENCOUNTER — Ambulatory Visit

## 2024-07-09 ENCOUNTER — Inpatient Hospital Stay: Attending: Oncology

## 2024-07-09 DIAGNOSIS — M6281 Muscle weakness (generalized): Secondary | ICD-10-CM | POA: Diagnosis not present

## 2024-07-09 DIAGNOSIS — Z79899 Other long term (current) drug therapy: Secondary | ICD-10-CM | POA: Insufficient documentation

## 2024-07-09 DIAGNOSIS — R278 Other lack of coordination: Secondary | ICD-10-CM

## 2024-07-09 DIAGNOSIS — R2681 Unsteadiness on feet: Secondary | ICD-10-CM

## 2024-07-09 DIAGNOSIS — M5459 Other low back pain: Secondary | ICD-10-CM

## 2024-07-09 DIAGNOSIS — R262 Difficulty in walking, not elsewhere classified: Secondary | ICD-10-CM

## 2024-07-09 DIAGNOSIS — R2689 Other abnormalities of gait and mobility: Secondary | ICD-10-CM

## 2024-07-09 DIAGNOSIS — E538 Deficiency of other specified B group vitamins: Secondary | ICD-10-CM | POA: Insufficient documentation

## 2024-07-09 DIAGNOSIS — Z85038 Personal history of other malignant neoplasm of large intestine: Secondary | ICD-10-CM | POA: Diagnosis not present

## 2024-07-09 DIAGNOSIS — D508 Other iron deficiency anemias: Secondary | ICD-10-CM | POA: Diagnosis not present

## 2024-07-09 MED ORDER — CYANOCOBALAMIN 1000 MCG/ML IJ SOLN
1000.0000 ug | INTRAMUSCULAR | Status: DC
  Administered 2024-07-09 (×2): 1000 ug via INTRAMUSCULAR
  Filled 2024-07-09: qty 1

## 2024-07-09 NOTE — Therapy (Signed)
 OUTPATIENT PHYSICAL THERAPY TREATMENT   Patient Name: Kristina Rowe MRN: [id number] DOB:July 03, 1930, 89 y.o., female Today's Date: 07/09/2024  PCP: Sherial Bail, MD  REFERRING PROVIDER:   Suzzane Charleston, PA-C   END OF SESSION:   PT End of Session - 07/09/24 1403     Visit Number 54    Number of Visits 59    Date for PT Re-Evaluation 07/16/24    Progress Note Due on Visit 60    PT Start Time 1400    PT Stop Time 1443    PT Time Calculation (min) 43 min    Equipment Utilized During Treatment Gait belt    Activity Tolerance Patient tolerated treatment well;No increased pain    Behavior During Therapy WFL for tasks assessed/performed              Past Medical History:  Diagnosis Date   Ductal carcinoma in situ (DCIS) of left breast    Dysphagia    Esophageal stricture    Gastroparesis    GERD (gastroesophageal reflux disease)    Hiatal hernia    Hypertension    Iron deficiency anemia    Osteoporosis    Pulmonary embolism (HCC)    Scoliosis    UTI (urinary tract infection)    Past Surgical History:  Procedure Laterality Date   BREAST LUMPECTOMY Left 2009   Ductal Carcinoma insitu    CATARACT EXTRACTION, BILATERAL     COLONOSCOPY N/A 03/17/2021   Procedure: COLONOSCOPY;  Surgeon: Maryruth Ole DASEN, MD;  Location: [facility];  Service: Endoscopy;  Laterality: N/A;   COLOSTOMY REVISION Right 03/18/2021   Procedure: COLON RESECTION RIGHT;  Surgeon: Desiderio Schanz, MD;  Location: [facility];  Service: General;  Laterality: Right;   LAPAROSCOPIC PARAESOPHAGEAL HERNIA REPAIR  2009   LAPAROTOMY N/A 03/10/2021   Procedure: EXPLORATORY LAPAROTOMY;  Surgeon: Desiderio Schanz, MD;  Location: [facility];  Service: General;  Laterality: N/A;   TUBAL LIGATION     Patient Active Problem List   Diagnosis Date Noted   Acute on chronic diastolic CHF (congestive heart failure) (HCC) 08/19/2023   Acute respiratory failure with hypoxia (HCC) 08/17/2023   History of pulmonary  embolus (PE) 08/17/2023   Basal cell carcinoma of leg 04/01/2021   Coronary atherosclerosis 04/01/2021   History of pulmonary embolism 04/01/2021   Pulmonary nodule 04/01/2021   Malignant neoplasm of colon (HCC) 03/29/2021   Rectal bleeding 03/15/2021   Hypertension    GERD (gastroesophageal reflux disease)    Colonic mass    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HCC) 03/10/2021   Anemia, unspecified 03/06/2021   Bronchiectasis without complication (HCC) 03/27/2020   Iron deficiency anemia 03/27/2020   Moderate aortic stenosis 03/27/2020   Stage 3a chronic kidney disease (HCC) 07/04/2019   Hypnic headache 05/28/2019   Telogen effluvium 04/17/2019   Xerosis cutis 04/17/2019   Essential hypertension 01/14/2019   Cervical radiculopathy 05/04/2018   Osteoarthritis of left glenohumeral joint 05/04/2018   Vitamin B12 deficiency 01/01/2018   Vascular abnormality 09/11/2017   Dyspepsia 03/28/2017   Pulmonary embolism (HCC) 08/16/2016   Dilated pore of [place] 03/23/2015   Retinal drusen of both eyes 08/28/2014   Status post right cataract extraction 07/30/2014   Verruca 03/19/2014   Chronic back pain 11/06/2013   Postmenopausal osteoporosis 06/11/2013   Anticoagulated on Coumadin  04/06/2013   Long term (current) use of anticoagulants 10/18/2012   Preglaucoma 05/10/2012   Presbyopia 05/10/2012   Senile nuclear sclerosis 05/10/2012   Lobular carcinoma in situ of breast  04/13/2012   Closed fracture of ankle 02/27/2012   History of nonmelanoma skin cancer 09/05/2011   Other benign neoplasm of skin of trunk 09/05/2011   Osteoporosis 08/25/2011   Transient alteration of awareness 09/03/2009   Colon adenoma 07/30/2002    ONSET DATE: 3 months   REFERRING DIAG:  Diagnosis  R53.83 (ICD-10-CM) - Other fatigue    THERAPY DIAG:  Muscle weakness (generalized)  Difficulty in walking, not elsewhere classified  Unsteadiness on feet  Other abnormalities of gait and mobility  Other low back  pain  Other lack of coordination  Rationale for Evaluation and Treatment: Rehabilitation  SUBJECTIVE:                                                                                                                                                                                             SUBJECTIVE STATEMENT:   Pt reports being tired from waking up early with a headache  Pt accompanied by: self  PERTINENT HISTORY:  From recent MD visit.  89 y.o. here for an acute issue. She has a PMH of chronic PE/Coumadin , anemia, colon cancer, breast cancer, large hiatal hernia, moderate aortic stenosis who has concerns about shortness of breath, nausea, generalized weakness. No chest pain. History of hiatal hernia and recent CT chest. Also recent endoscopy. Takes Zofran  chronically. Has felt weaker and almost fell recently. Uses a walker. Also on chronic UTI suppression, Keflex .   PAIN:  Are you having pain?  Yes, typical left tlow back pain 8/10  PRECAUTIONS: Fall  RED FLAGS: None   WEIGHT BEARING RESTRICTIONS: No  FALLS: Has patient fallen in last 6 months? No  LIVING ENVIRONMENT: Lives with: lives alone Lives in: House/apartment Stairs: Yes: External: 1 steps; none Has following equipment at home: Walker - 4 wheeled  PLOF: Independent with household mobility with device and Independent with community mobility with device  PATIENT GOALS: improved strength in BLE   OBJECTIVE:   DIAGNOSTIC FINDINGS:  06/12/2023 EXAM: CT CHEST, ABDOMEN, AND PELVIS WITH CONTRAST IMPRESSION: 1. Status post right hemicolectomy, without recurrent or metastatic disease. Decreased sensitivity exam, primarily involving the abdomen pelvis, due to paucity of fat. 2. New tiny left groin hernia containing fluid. Consider physical exam correlation. 3. Incidental findings, including: Coronary artery atherosclerosis. Aortic Atherosclerosis (ICD10-I70.0). Dilated esophagus with contrast throughout,  suggesting dysmotility and/or gastroesophageal reflux.  COGNITION: Overall cognitive status: Within functional limits for tasks assessed   SENSATION: Light touch: Impaired  mild paraesthesia in the proximal RLE   COORDINATION: WFL. Ankle to knee limited ROM due to weakness in the R hip      POSTURE: rounded  shoulders, forward head, flexed trunk , and severe scoliosis     LOWER EXTREMITY MMT:    MMT Right Eval Left Eval  Hip flexion 4- 4  Hip extension    Hip abduction 4- 44  Hip adduction 4 4  Hip internal rotation    Hip external rotation    Knee flexion 4- 4  Knee extension 4 4+  Ankle dorsiflexion 4 4+  Ankle plantarflexion    Ankle inversion    Ankle eversion    (Blank rows = not tested)    TRANSFERS: Assistive device utilized: Environmental consultant - 4 wheeled  Sit to stand: SBA Stand to sit: SBA Chair to chair: SBA Floor: Unable to perform    STAIRS: Level of Assistance: pt declines Stair Negotiation Technique: Step to Pattern with Bilateral Rails Number of Stairs: 4  Height of Stairs: 6  Comments: will need to assess.   GAIT: Gait pattern: step through pattern, Right foot flat, and lateral hip instability Distance walked: 264ft Assistive device utilized: Walker - 4 wheeled Level of assistance: Modified independence and SBA Comments: severe scoliosis.    FUNCTIONAL TESTS:  5 times sit to stand: 30.86 Timed up and go (TUG): 29.39 2 minute walk test: 269ft  10 meter walk test: 0.649m/s, SOB 4-5.  Berg Balance Scale: TBD  PATIENT SURVEYS:  FOTO 56. goal 64  TODAY'S TREATMENT 07/09/24   NMR:   Postural awareness:  Scap retract: standing with RTB 2 x 15 reps  Should ext: BUE with RTB 2 x 15 reps  Horizontal Shoulder ABD- RTB 2 x 15 reps Shoulder D2 flex RTB x 15 reps Shoulder ER RTB 2 x 10 reps Standing Thoracic rotation at wall x 10 reps ea LE    Self care/Home management:   ADDED TO HEP and provided handout-Reviewed thoroughly w  patient            PATIENT EDUCATION: Education details: Pt educated throughout session about proper posture and technique with exercises. Improved exercise technique, movement at target joints, use of target muscles after min to mod verbal, visual, tactile cues. Person educated: Patient Education method: Explanation, demo, VC Education comprehension: verbalized understanding and returned demonstration  HOME EXERCISE PROGRAM: Access Code: [email or web address] URL: [email or web address] Date: 07/09/2024 Prepared by: Reyes London  Exercises - Seated Scapular Retraction  - 3 x weekly - 3 sets - 10 reps - Shoulder External Rotation and Scapular Retraction with Resistance  - 3 x weekly - 3 sets - 10 reps - Standing Shoulder Row with Anchored Resistance  - 3 x weekly - 3 sets - 10 reps - Shoulder extension with resistance - Neutral  - 3 x weekly - 3 sets - 10 reps - Shoulder External Rotation and Scapular Retraction with Resistance  - 3 x weekly - 3 sets - 10 reps - Seated Thoracic Lumbar Extension  - 1 x daily - 3 sets - 10 reps - Standing Thoracic Open Book at Wall  - 3 x weekly - 3 sets - 10 reps       Access Code: [email or web address] URL: [email or web address] Date: 05/30/2024 Prepared by: Peggye Linear  Exercises - Side Stepping with Counter Support  - 2 sets - 30 reps - Heel Raises with Counter Support  - 3 sets - 15 reps - Standing March with Counter Support  - 2 sets - 30 reps - Standing Tandem Balance with Unilateral Counter Support  - 3 sets - 10 reps - 15 hold  Access Code: [email or web address] URL: [email or web address] Date: 09/25/2023  Prepared by: Massie Dollar  Exercises - Seated Hip Abduction with Resistance  - 1 x daily - 4 x weekly - 3 sets - 15 reps - 3 hold - Seated March with Resistance  - 1 x daily - 4 x weekly - 3 sets - 10 reps - 1 hold - Seated Long Arc Quad  - 1 x daily - 4 x weekly - 3 sets - 12 reps - 3 hold - Seated Hip  Adduction Isometrics with Ball  - 1 x daily - 4 x weekly - 3 sets - 12 reps - 3 hold - Standing March with Counter Support  - 1 x daily - 3 x weekly - 2 sets - 10 reps - Standing Hip Abduction with Counter Support  - 1 x daily - 3 x weekly - 2 sets - 10 reps - Standing Hip Extension with Counter Support  - 1 x daily - 3 x weekly - 2 sets - 10 reps - Mini Squat with Counter Support  - 1 x daily - 3 x weekly - 2 sets - 10 reps - Standing Knee Flexion with Counter Support  - 1 x daily - 3 x weekly - 2 sets - 10 reps - Standing Tandem Balance with Counter Support  - 1 x daily - 3 x weekly - 2 sets - 4 reps - 10 hold  GOALS: Goals reviewed with patient? Yes  SHORT TERM GOALS: Target date: 12/28/23  Patient will be independent in home exercise program to improve strength/mobility for better functional independence with ADLs. Baseline:to be given at next session; 10/23/2023- Patient reports independence with HEP provided and no significant issues.   Goal status: MET 2. Pt will be able to ambulate for at least 5 min without stopping with LRAD to allow improved access to community and return to PLOF  Baseline: 3 minutes. 2/11: 6 minute walk test completed.   Goal Status: MET  . LONG TERM GOALS: Target date: 07/16/2024  Patient will increase FOTO score to equal to or greater than 61 to demonstrate statistically significant improvement in mobility and quality of life.  Baseline: 56; 10/23/2023= 60 Goal status: PROGRESSING/DISCONTINUED   2.  Patient (> 66 years old) will complete five times sit to stand test in < 15 seconds  without UE support indicating an increased LE strength and improved balance. Baseline: 30.86 sec; 10/23/2023= 22.08 using 1 UE support.  12/11: 14.67 sec BUE from arm rest. And 14.12 with 1 UE pushing from arm rest. 01/30/24 13 seconds with UE  3/26: 15.0  able to achieve full stand on each repetition  Goal status: MET  3.  Patient will increase Berg Balance score to >45 points  to demonstrate decreased fall risk during functional activities  Baseline: 26; 10/23/2023= 30 12/11: 34 01/30/24: 41/56 3/26: 40/56 04/23/2024= 44/56 06/20/2024= 46/56 Goal status: Will keep goal active to ensure consistent.   4.  Patient will increase 10 meter walk test to >1.49m/s as to improve gait speed for better community ambulation and to reduce fall risk. Baseline: 0.629m/s; 10/23/2023= 0.88 m/s (normal speed with 4WW) 12/11: Average Fast speed: 1.05 m/s Average Normal speed: 0.834 m/s 01/30/24: 1.14 m/s 3/26: 1.0 m/s Goal status: MET  5.  Patient will reduce timed up and go to <11 seconds to reduce fall risk and demonstrate improved transfer/gait ability. Baseline: 29.39 12/2: 16.4 sec with Rollator  12/11: 15.19 sec with rollator  2/4: 17.38 sec with rollator  3/4: 15 sec with rollator  3/26:  13.76 sec with rollator  04/23/2024= 12.34 sec avg with SPC Goal status: IN PROGRESS  6.  Patient will 2 min walk test by at least 49ft as to demonstrate reduced fall risk and improved dynamic gait balance for better safety with community/home ambulation.  Baseline: 266ft; 10/23/2023= 360 feet  with 4WW 352ft with 4WW Goal status: MET  7. Pt will complete 6 min walk test without stopping and improve overall score by at least 1110ft to indicate increased access to community.  Baseline: to be complete 2/11:880 ft with RW; 01/30/24: 1168 ft with RW  3/26: 1138ft with Rollator  06/20/2024= 1180 feet with Rollator  Goal: MET   ASSESSMENT: CLINICAL IMPRESSION:  Treatment focused on postural awareness activities today and finalizing a HEP tailoring to resistive strengthening. She required some VC and visual demonstration but overall good response to treatment today. She was able to review the printout and verbalized good understanding of activities.   Ms. Son will continue to benefit from skilled physical therapy intervention to address stated impairments, improve QOL, decrease fall risk, and  attain therapy goals.   OBJECTIVE IMPAIRMENTS: Abnormal gait, cardiopulmonary status limiting activity, decreased activity tolerance, decreased balance, decreased endurance, decreased mobility, difficulty walking, decreased ROM, decreased strength, decreased safety awareness, hypomobility, increased fascial restrictions, impaired flexibility, improper body mechanics, and postural dysfunction.   ACTIVITY LIMITATIONS: lifting, standing, squatting, transfers, and locomotion level  PARTICIPATION LIMITATIONS: laundry, shopping, and community activity  PERSONAL FACTORS: 3+ comorbidities: scoliosis, hx of CA, and PE are also affecting patient's functional outcome.   REHAB POTENTIAL: Good  CLINICAL DECISION MAKING: Stable/uncomplicated  EVALUATION COMPLEXITY: Moderate  PLAN:  PT FREQUENCY: 1-2x/week  PT DURATION: 12 weeks  PLANNED INTERVENTIONS: Therapeutic exercises, Therapeutic activity, Neuromuscular re-education, Balance training, Gait training, Patient/Family education, Self Care, Joint mobilization, Stair training, Vestibular training, DME instructions, Dry Needling, Spinal mobilization, Cryotherapy, Moist heat, and Manual therapy  PLAN FOR NEXT SESSION:  Continue Dynamic balance and gait training. Progressing to higher level variable direction stepping and reaching tasks. BLE and postural strengthening.   Chyrl London, PT Physical Therapist - [facility]  3:34 PM 07/09/24

## 2024-07-10 ENCOUNTER — Inpatient Hospital Stay: Payer: Medicare Other

## 2024-07-11 ENCOUNTER — Inpatient Hospital Stay

## 2024-07-11 ENCOUNTER — Ambulatory Visit

## 2024-07-11 DIAGNOSIS — M5459 Other low back pain: Secondary | ICD-10-CM

## 2024-07-11 DIAGNOSIS — R262 Difficulty in walking, not elsewhere classified: Secondary | ICD-10-CM

## 2024-07-11 DIAGNOSIS — R2681 Unsteadiness on feet: Secondary | ICD-10-CM

## 2024-07-11 DIAGNOSIS — M6281 Muscle weakness (generalized): Secondary | ICD-10-CM

## 2024-07-11 DIAGNOSIS — R278 Other lack of coordination: Secondary | ICD-10-CM

## 2024-07-11 DIAGNOSIS — R2689 Other abnormalities of gait and mobility: Secondary | ICD-10-CM

## 2024-07-11 NOTE — Therapy (Signed)
 OUTPATIENT PHYSICAL THERAPY TREATMENT   Patient Name: Kristina Rowe MRN: [id number] DOB:June 14, 1930, 89 y.o., female Today's Date: 07/11/2024  PCP: Sherial Bail, MD  REFERRING PROVIDER:   Suzzane Charleston, PA-C   END OF SESSION:   PT End of Session - 07/11/24 1611     Visit Number 55    Number of Visits 59    Date for PT Re-Evaluation 07/16/24    Progress Note Due on Visit 60    PT Start Time 1530    PT Stop Time 1610    PT Time Calculation (min) 40 min    Equipment Utilized During Treatment Gait belt    Activity Tolerance Patient tolerated treatment well;No increased pain    Behavior During Therapy WFL for tasks assessed/performed               Past Medical History:  Diagnosis Date   Ductal carcinoma in situ (DCIS) of left breast    Dysphagia    Esophageal stricture    Gastroparesis    GERD (gastroesophageal reflux disease)    Hiatal hernia    Hypertension    Iron deficiency anemia    Osteoporosis    Pulmonary embolism (HCC)    Scoliosis    UTI (urinary tract infection)    Past Surgical History:  Procedure Laterality Date   BREAST LUMPECTOMY Left 2009   Ductal Carcinoma insitu    CATARACT EXTRACTION, BILATERAL     COLONOSCOPY N/A 03/17/2021   Procedure: COLONOSCOPY;  Surgeon: Maryruth Ole DASEN, MD;  Location: [facility];  Service: Endoscopy;  Laterality: N/A;   COLOSTOMY REVISION Right 03/18/2021   Procedure: COLON RESECTION RIGHT;  Surgeon: Desiderio Schanz, MD;  Location: [facility];  Service: General;  Laterality: Right;   LAPAROSCOPIC PARAESOPHAGEAL HERNIA REPAIR  2009   LAPAROTOMY N/A 03/10/2021   Procedure: EXPLORATORY LAPAROTOMY;  Surgeon: Desiderio Schanz, MD;  Location: [facility] OR[facility];  Service: General;  Laterality: N/A;   TUBAL LIGATION     Patient Active Problem List   Diagnosis Date Noted   Acute on chronic diastolic CHF (congestive heart failure) (HCC) 08/19/2023   Acute respiratory failure with hypoxia (HCC) 08/17/2023   History of  pulmonary embolus (PE) 08/17/2023   Basal cell carcinoma of leg 04/01/2021   Coronary atherosclerosis 04/01/2021   History of pulmonary embolism 04/01/2021   Pulmonary nodule 04/01/2021   Malignant neoplasm of colon (HCC) 03/29/2021   Rectal bleeding 03/15/2021   Hypertension    GERD (gastroesophageal reflux disease)    Colonic mass    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HCC) 03/10/2021   Anemia, unspecified 03/06/2021   Bronchiectasis without complication (HCC) 03/27/2020   Iron deficiency anemia 03/27/2020   Moderate aortic stenosis 03/27/2020   Stage 3a chronic kidney disease (HCC) 07/04/2019   Hypnic headache 05/28/2019   Telogen effluvium 04/17/2019   Xerosis cutis 04/17/2019   Essential hypertension 01/14/2019   Cervical radiculopathy 05/04/2018   Osteoarthritis of left glenohumeral joint 05/04/2018   Vitamin B12 deficiency 01/01/2018   Vascular abnormality 09/11/2017   Dyspepsia 03/28/2017   Pulmonary embolism (HCC) 08/16/2016   Dilated pore of [place] 03/23/2015   Retinal drusen of both eyes 08/28/2014   Status post right cataract extraction 07/30/2014   Verruca 03/19/2014   Chronic back pain 11/06/2013   Postmenopausal osteoporosis 06/11/2013   Anticoagulated on Coumadin  04/06/2013   Long term (current) use of anticoagulants 10/18/2012   Preglaucoma 05/10/2012   Presbyopia 05/10/2012   Senile nuclear sclerosis 05/10/2012   Lobular carcinoma in situ of  breast 04/13/2012   Closed fracture of ankle 02/27/2012   History of nonmelanoma skin cancer 09/05/2011   Other benign neoplasm of skin of trunk 09/05/2011   Osteoporosis 08/25/2011   Transient alteration of awareness 09/03/2009   Colon adenoma 07/30/2002    ONSET DATE: 3 months   REFERRING DIAG:  Diagnosis  R53.83 (ICD-10-CM) - Other fatigue    THERAPY DIAG:  Muscle weakness (generalized)  Difficulty in walking, not elsewhere classified  Unsteadiness on feet  Other abnormalities of gait and mobility  Other low  back pain  Other lack of coordination  Rationale for Evaluation and Treatment: Rehabilitation  SUBJECTIVE:                                                                                                                                                                                             SUBJECTIVE STATEMENT:   Pt reports being tired  and have a little back pain.  Pt accompanied by: self  PERTINENT HISTORY:  From recent MD visit.  89 y.o. here for an acute issue. She has a PMH of chronic PE/Coumadin , anemia, colon cancer, breast cancer, large hiatal hernia, moderate aortic stenosis who has concerns about shortness of breath, nausea, generalized weakness. No chest pain. History of hiatal hernia and recent CT chest. Also recent endoscopy. Takes Zofran  chronically. Has felt weaker and almost fell recently. Uses a walker. Also on chronic UTI suppression, Keflex .   PAIN:  Are you having pain?  Yes, typical left tlow back pain 8/10  PRECAUTIONS: Fall  RED FLAGS: None   WEIGHT BEARING RESTRICTIONS: No  FALLS: Has patient fallen in last 6 months? No  LIVING ENVIRONMENT: Lives with: lives alone Lives in: House/apartment Stairs: Yes: External: 1 steps; none Has following equipment at home: Walker - 4 wheeled  PLOF: Independent with household mobility with device and Independent with community mobility with device  PATIENT GOALS: improved strength in BLE   OBJECTIVE:   DIAGNOSTIC FINDINGS:  06/12/2023 EXAM: CT CHEST, ABDOMEN, AND PELVIS WITH CONTRAST IMPRESSION: 1. Status post right hemicolectomy, without recurrent or metastatic disease. Decreased sensitivity exam, primarily involving the abdomen pelvis, due to paucity of fat. 2. New tiny left groin hernia containing fluid. Consider physical exam correlation. 3. Incidental findings, including: Coronary artery atherosclerosis. Aortic Atherosclerosis (ICD10-I70.0). Dilated esophagus with contrast throughout, suggesting  dysmotility and/or gastroesophageal reflux.  COGNITION: Overall cognitive status: Within functional limits for tasks assessed   SENSATION: Light touch: Impaired  mild paraesthesia in the proximal RLE   COORDINATION: WFL. Ankle to knee limited ROM due to weakness in the R hip      POSTURE:  rounded shoulders, forward head, flexed trunk , and severe scoliosis     LOWER EXTREMITY MMT:    MMT Right Eval Left Eval  Hip flexion 4- 4  Hip extension    Hip abduction 4- 4  Hip adduction 4 4  Hip internal rotation    Hip external rotation    Knee flexion 4- 4  Knee extension 4 4+  Ankle dorsiflexion 4 4+  Ankle plantarflexion    Ankle inversion    Ankle eversion    (Blank rows = not tested)    TRANSFERS: Assistive device utilized: Environmental consultant - 4 wheeled  Sit to stand: SBA Stand to sit: SBA Chair to chair: SBA Floor: Unable to perform    STAIRS: Level of Assistance: pt declines Stair Negotiation Technique: Step to Pattern with Bilateral Rails Number of Stairs: 4  Height of Stairs: 6  Comments: will need to assess.   GAIT: Gait pattern: step through pattern, Right foot flat, and lateral hip instability Distance walked: 275ft Assistive device utilized: Walker - 4 wheeled Level of assistance: Modified independence and SBA Comments: severe scoliosis.    FUNCTIONAL TESTS:  5 times sit to stand: 30.86 Timed up and go (TUG): 29.39 2 minute walk test: 230ft  10 meter walk test: 0.651m/s, SOB 4-5.  Berg Balance Scale: TBD  PATIENT SURVEYS:  FOTO 56. goal 64  TODAY'S TREATMENT 07/11/24   NMR:   Postural awareness: Seated Thoracic rotation x 15 reps each Direction Seated posterior shoulder rolls 2 x 20 reps   Seated D2 flex x 15 reps each direction Seated BUE flexion for increased thoracic ext 2 x 10  Seated thoracic ext (as pain free as possible) 2 x 10 reps Seated shoulder retract 2 x 15 reps  Should ext: BUE 2 x 15 reps  Horizontal Shoulder ABD 2 x 15  reps Shoulder ER (AROM)  2 x 10 reps Seated side bending (fingers toward floor) 2 x 10 reps   Therapeutic Activities: dynamic therapeutic activities  designed to achieve improved functional performance   Sit to stand from airex pad in chair x 10 (1/2 without UE and 1/2 with UE support  Step up x 15 reps with min UE support   Side step up/over 4 wide board x 15 reps alt LE  Side step up up/down x 15 reps each LE             PATIENT EDUCATION: Education details: Pt educated throughout session about proper posture and technique with exercises. Improved exercise technique, movement at target joints, use of target muscles after min to mod verbal, visual, tactile cues. Person educated: Patient Education method: Explanation, demo, VC Education comprehension: verbalized understanding and returned demonstration  HOME EXERCISE PROGRAM: Access Code: [email or web address] URL: [email or web address] Date: 07/09/2024 Prepared by: Reyes London  Exercises - Seated Scapular Retraction  - 3 x weekly - 3 sets - 10 reps - Shoulder External Rotation and Scapular Retraction with Resistance  - 3 x weekly - 3 sets - 10 reps - Standing Shoulder Row with Anchored Resistance  - 3 x weekly - 3 sets - 10 reps - Shoulder extension with resistance - Neutral  - 3 x weekly - 3 sets - 10 reps - Shoulder External Rotation and Scapular Retraction with Resistance  - 3 x weekly - 3 sets - 10 reps - Seated Thoracic Lumbar Extension  - 1 x daily - 3 sets - 10 reps - Standing Thoracic Open Book at Wall  - 3 x weekly -  3 sets - 10 reps       Access Code: [email or web address] URL: [email or web address] Date: 05/30/2024 Prepared by: Peggye Linear  Exercises - Side Stepping with Counter Support  - 2 sets - 30 reps - Heel Raises with Counter Support  - 3 sets - 15 reps - Standing March with Counter Support  - 2 sets - 30 reps - Standing Tandem Balance with Unilateral Counter Support  - 3 sets  - 10 reps - 15 hold  Access Code: [email or web address] URL: [email or web address] Date: 09/25/2023 Prepared by: Massie Dollar  Exercises - Seated Hip Abduction with Resistance  - 1 x daily - 4 x weekly - 3 sets - 15 reps - 3 hold - Seated March with Resistance  - 1 x daily - 4 x weekly - 3 sets - 10 reps - 1 hold - Seated Long Arc Quad  - 1 x daily - 4 x weekly - 3 sets - 12 reps - 3 hold - Seated Hip Adduction Isometrics with Ball  - 1 x daily - 4 x weekly - 3 sets - 12 reps - 3 hold - Standing March with Counter Support  - 1 x daily - 3 x weekly - 2 sets - 10 reps - Standing Hip Abduction with Counter Support  - 1 x daily - 3 x weekly - 2 sets - 10 reps - Standing Hip Extension with Counter Support  - 1 x daily - 3 x weekly - 2 sets - 10 reps - Mini Squat with Counter Support  - 1 x daily - 3 x weekly - 2 sets - 10 reps - Standing Knee Flexion with Counter Support  - 1 x daily - 3 x weekly - 2 sets - 10 reps - Standing Tandem Balance with Counter Support  - 1 x daily - 3 x weekly - 2 sets - 4 reps - 10 hold  GOALS: Goals reviewed with patient? Yes  SHORT TERM GOALS: Target date: 12/28/23  Patient will be independent in home exercise program to improve strength/mobility for better functional independence with ADLs. Baseline:to be given at next session; 10/23/2023- Patient reports independence with HEP provided and no significant issues.   Goal status: MET 2. Pt will be able to ambulate for at least 5 min without stopping with LRAD to allow improved access to community and return to PLOF  Baseline: 3 minutes. 2/11: 6 minute walk test completed.   Goal Status: MET  . LONG TERM GOALS: Target date: 07/16/2024  Patient will increase FOTO score to equal to or greater than 61 to demonstrate statistically significant improvement in mobility and quality of life.  Baseline: 56; 10/23/2023= 60 Goal status: PROGRESSING/DISCONTINUED   2.  Patient (> 62 years old) will complete five times sit  to stand test in < 15 seconds  without UE support indicating an increased LE strength and improved balance. Baseline: 30.86 sec; 10/23/2023= 22.08 using 1 UE support.  12/11: 14.67 sec BUE from arm rest. And 14.12 with 1 UE pushing from arm rest. 01/30/24 13 seconds with UE  3/26: 15.0  able to achieve full stand on each repetition  Goal status: MET  3.  Patient will increase Berg Balance score to >45 points to demonstrate decreased fall risk during functional activities  Baseline: 26; 10/23/2023= 30 12/11: 34 01/30/24: 41/56 3/26: 40/56 04/23/2024= 44/56 06/20/2024= 46/56 Goal status: Will keep goal active to ensure consistent.   4.  Patient will increase 10 meter walk test to >1.71m/s as to improve  gait speed for better community ambulation and to reduce fall risk. Baseline: 0.684m/s; 10/23/2023= 0.88 m/s (normal speed with 4WW) 12/11: Average Fast speed: 1.05 m/s Average Normal speed: 0.834 m/s 01/30/24: 1.14 m/s 3/26: 1.0 m/s Goal status: MET  5.  Patient will reduce timed up and go to <11 seconds to reduce fall risk and demonstrate improved transfer/gait ability. Baseline: 29.39 12/2: 16.4 sec with Rollator  12/11: 15.19 sec with rollator  2/4: 17.38 sec with rollator  3/4: 15 sec with rollator  3/26: 13.76 sec with rollator  04/23/2024= 12.34 sec avg with SPC Goal status: IN PROGRESS  6.  Patient will 2 min walk test by at least 9ft as to demonstrate reduced fall risk and improved dynamic gait balance for better safety with community/home ambulation.  Baseline: 285ft; 10/23/2023= 360 feet  with 4WW 347ft with 4WW Goal status: MET  7. Pt will complete 6 min walk test without stopping and improve overall score by at least 148ft to indicate increased access to community.  Baseline: to be complete 2/11:880 ft with RW; 01/30/24: 1168 ft with RW  3/26: 1170ft with Rollator  06/20/2024= 1180 feet with Rollator  Goal: MET   ASSESSMENT: CLINICAL IMPRESSION:  Patient performed some  seated postural activities and performed well- no specific pain except with end range thoracic rotation. She is ultimately now able to follow just verbal commands with postural activities with good understanding/memory of specifics. She has one more appointment remaining for final review and then plan to discharge as appropriate.  OBJECTIVE IMPAIRMENTS: Abnormal gait, cardiopulmonary status limiting activity, decreased activity tolerance, decreased balance, decreased endurance, decreased mobility, difficulty walking, decreased ROM, decreased strength, decreased safety awareness, hypomobility, increased fascial restrictions, impaired flexibility, improper body mechanics, and postural dysfunction.   ACTIVITY LIMITATIONS: lifting, standing, squatting, transfers, and locomotion level  PARTICIPATION LIMITATIONS: laundry, shopping, and community activity  PERSONAL FACTORS: 3+ comorbidities: scoliosis, hx of CA, and PE are also affecting patient's functional outcome.   REHAB POTENTIAL: Good  CLINICAL DECISION MAKING: Stable/uncomplicated  EVALUATION COMPLEXITY: Moderate  PLAN:  PT FREQUENCY: 1-2x/week  PT DURATION: 12 weeks  PLANNED INTERVENTIONS: Therapeutic exercises, Therapeutic activity, Neuromuscular re-education, Balance training, Gait training, Patient/Family education, Self Care, Joint mobilization, Stair training, Vestibular training, DME instructions, Dry Needling, Spinal mobilization, Cryotherapy, Moist heat, and Manual therapy  PLAN FOR NEXT SESSION:  Final Review of HEP and plan for discharge next visit.   Chyrl London, PT Physical Therapist - [facility]  4:15 PM 07/11/24

## 2024-07-12 ENCOUNTER — Telehealth: Payer: Self-pay | Admitting: *Deleted

## 2024-07-12 NOTE — Telephone Encounter (Signed)
 Patient would like to know the results of her CT scan

## 2024-07-12 NOTE — Telephone Encounter (Signed)
 Ct results will be told at the time of visit. It is not even reported

## 2024-07-12 NOTE — Telephone Encounter (Signed)
 error

## 2024-07-16 ENCOUNTER — Ambulatory Visit

## 2024-07-16 DIAGNOSIS — R2689 Other abnormalities of gait and mobility: Secondary | ICD-10-CM

## 2024-07-16 DIAGNOSIS — M6281 Muscle weakness (generalized): Secondary | ICD-10-CM

## 2024-07-16 DIAGNOSIS — R2681 Unsteadiness on feet: Secondary | ICD-10-CM

## 2024-07-16 DIAGNOSIS — R262 Difficulty in walking, not elsewhere classified: Secondary | ICD-10-CM

## 2024-07-16 DIAGNOSIS — R278 Other lack of coordination: Secondary | ICD-10-CM

## 2024-07-16 DIAGNOSIS — M5459 Other low back pain: Secondary | ICD-10-CM

## 2024-07-16 NOTE — Therapy (Signed)
 OUTPATIENT PHYSICAL THERAPY TREATMENT   Patient Name: Kristina Rowe MRN: [id number] DOB:1930/09/27, 89 y.o., female Today's Date: 07/16/2024  PCP: Sherial Bail, MD  REFERRING PROVIDER:   Suzzane Charleston, PA-C   END OF SESSION:   PT End of Session - 07/16/24 1454     Visit Number 56    Number of Visits 59    Date for PT Re-Evaluation 07/16/24    Progress Note Due on Visit 60    PT Start Time 1449    PT Stop Time 1521    PT Time Calculation (min) 32 min    Equipment Utilized During Treatment Gait belt    Activity Tolerance Patient tolerated treatment well;No increased pain    Behavior During Therapy WFL for tasks assessed/performed               Past Medical History:  Diagnosis Date   Ductal carcinoma in situ (DCIS) of left breast    Dysphagia    Esophageal stricture    Gastroparesis    GERD (gastroesophageal reflux disease)    Hiatal hernia    Hypertension    Iron deficiency anemia    Osteoporosis    Pulmonary embolism (HCC)    Scoliosis    UTI (urinary tract infection)    Past Surgical History:  Procedure Laterality Date   BREAST LUMPECTOMY Left 2009   Ductal Carcinoma insitu    CATARACT EXTRACTION, BILATERAL     COLONOSCOPY N/A 03/17/2021   Procedure: COLONOSCOPY;  Surgeon: Maryruth Ole DASEN, MD;  Location: [facility];  Service: Endoscopy;  Laterality: N/A;   COLOSTOMY REVISION Right 03/18/2021   Procedure: COLON RESECTION RIGHT;  Surgeon: Desiderio Schanz, MD;  Location: [facility];  Service: General;  Laterality: Right;   LAPAROSCOPIC PARAESOPHAGEAL HERNIA REPAIR  2009   LAPAROTOMY N/A 03/10/2021   Procedure: EXPLORATORY LAPAROTOMY;  Surgeon: Desiderio Schanz, MD;  Location: [facility];  Service: General;  Laterality: N/A;   TUBAL LIGATION     Patient Active Problem List   Diagnosis Date Noted   Acute on chronic diastolic CHF (congestive heart failure) (HCC) 08/19/2023   Acute respiratory failure with hypoxia (HCC) 08/17/2023   History of  pulmonary embolus (PE) 08/17/2023   Basal cell carcinoma of leg 04/01/2021   Coronary atherosclerosis 04/01/2021   History of pulmonary embolism 04/01/2021   Pulmonary nodule 04/01/2021   Malignant neoplasm of colon (HCC) 03/29/2021   Rectal bleeding 03/15/2021   Hypertension    GERD (gastroesophageal reflux disease)    Colonic mass    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HCC) 03/10/2021   Anemia, unspecified 03/06/2021   Bronchiectasis without complication (HCC) 03/27/2020   Iron deficiency anemia 03/27/2020   Moderate aortic stenosis 03/27/2020   Stage 3a chronic kidney disease (HCC) 07/04/2019   Hypnic headache 05/28/2019   Telogen effluvium 04/17/2019   Xerosis cutis 04/17/2019   Essential hypertension 01/14/2019   Cervical radiculopathy 05/04/2018   Osteoarthritis of left glenohumeral joint 05/04/2018   Vitamin B12 deficiency 01/01/2018   Vascular abnormality 09/11/2017   Dyspepsia 03/28/2017   Pulmonary embolism (HCC) 08/16/2016   Dilated pore of [place] 03/23/2015   Retinal drusen of both eyes 08/28/2014   Status post right cataract extraction 07/30/2014   Verruca 03/19/2014   Chronic back pain 11/06/2013   Postmenopausal osteoporosis 06/11/2013   Anticoagulated on Coumadin  04/06/2013   Long term (current) use of anticoagulants 10/18/2012   Preglaucoma 05/10/2012   Presbyopia 05/10/2012   Senile nuclear sclerosis 05/10/2012   Lobular carcinoma in situ of  breast 04/13/2012   Closed fracture of ankle 02/27/2012   History of nonmelanoma skin cancer 09/05/2011   Other benign neoplasm of skin of trunk 09/05/2011   Osteoporosis 08/25/2011   Transient alteration of awareness 09/03/2009   Colon adenoma 07/30/2002    ONSET DATE: 3 months   REFERRING DIAG:  Diagnosis  R53.83 (ICD-10-CM) - Other fatigue    THERAPY DIAG:  Muscle weakness (generalized)  Difficulty in walking, not elsewhere classified  Unsteadiness on feet  Other abnormalities of gait and mobility  Other low  back pain  Other lack of coordination  Rationale for Evaluation and Treatment: Rehabilitation  SUBJECTIVE:                                                                                                                                                                                             SUBJECTIVE STATEMENT:   Pt reports having a good day and states grateful for all services stating she has come a long way.    Pt accompanied by: self  PERTINENT HISTORY:  From recent MD visit.  89 y.o. here for an acute issue. She has a PMH of chronic PE/Coumadin , anemia, colon cancer, breast cancer, large hiatal hernia, moderate aortic stenosis who has concerns about shortness of breath, nausea, generalized weakness. No chest pain. History of hiatal hernia and recent CT chest. Also recent endoscopy. Takes Zofran  chronically. Has felt weaker and almost fell recently. Uses a walker. Also on chronic UTI suppression, Keflex .   PAIN:  Are you having pain?  Yes, typical left tlow back pain 8/10  PRECAUTIONS: Fall  RED FLAGS: None   WEIGHT BEARING RESTRICTIONS: No  FALLS: Has patient fallen in last 6 months? No  LIVING ENVIRONMENT: Lives with: lives alone Lives in: House/apartment Stairs: Yes: External: 1 steps; none Has following equipment at home: Walker - 4 wheeled  PLOF: Independent with household mobility with device and Independent with community mobility with device  PATIENT GOALS: improved strength in BLE   OBJECTIVE:   DIAGNOSTIC FINDINGS:  06/12/2023 EXAM: CT CHEST, ABDOMEN, AND PELVIS WITH CONTRAST IMPRESSION: 1. Status post right hemicolectomy, without recurrent or metastatic disease. Decreased sensitivity exam, primarily involving the abdomen pelvis, due to paucity of fat. 2. New tiny left groin hernia containing fluid. Consider physical exam correlation. 3. Incidental findings, including: Coronary artery atherosclerosis. Aortic Atherosclerosis (ICD10-I70.0). Dilated  esophagus with contrast throughout, suggesting dysmotility and/or gastroesophageal reflux.  COGNITION: Overall cognitive status: Within functional limits for tasks assessed   SENSATION: Light touch: Impaired  mild paraesthesia in the proximal RLE   COORDINATION: WFL. Ankle to knee limited ROM due to weakness  in the R hip      POSTURE: rounded shoulders, forward head, flexed trunk , and severe scoliosis     LOWER EXTREMITY MMT:    MMT Right Eval Left Eval  Hip flexion 4- 4  Hip extension    Hip abduction 4- 4  Hip adduction 4 4  Hip internal rotation    Hip external rotation    Knee flexion 4- 4  Knee extension 4 4+  Ankle dorsiflexion 4 4+  Ankle plantarflexion    Ankle inversion    Ankle eversion    (Blank rows = not tested)    TRANSFERS: Assistive device utilized: Environmental consultant - 4 wheeled  Sit to stand: SBA Stand to sit: SBA Chair to chair: SBA Floor: Unable to perform    STAIRS: Level of Assistance: pt declines Stair Negotiation Technique: Step to Pattern with Bilateral Rails Number of Stairs: 4  Height of Stairs: 6  Comments: will need to assess.   GAIT: Gait pattern: step through pattern, Right foot flat, and lateral hip instability Distance walked: 283ft Assistive device utilized: Walker - 4 wheeled Level of assistance: Modified independence and SBA Comments: severe scoliosis.    FUNCTIONAL TESTS:  5 times sit to stand: 30.86 Timed up and go (TUG): 29.39 2 minute walk test: 22ft  10 meter walk test: 0.647m/s, SOB 4-5.  Berg Balance Scale: TBD  PATIENT SURVEYS:  FOTO 56. goal 86  TODAY'S TREATMENT 07/11/24   PT instructed pt in TUG: 12.02 sec avg with Rollator (average of 3 trials; >13.5 sec indicates increased fall risk)       [facility] PT Assessment - 07/16/24 1510       Berg Balance Test   Sit to Stand Able to stand  independently using hands    Standing Unsupported Able to stand safely 2 minutes    Sitting with Back Unsupported but  Feet Supported on Floor or Stool Able to sit safely and securely 2 minutes    Stand to Sit Sits safely with minimal use of hands    Transfers Able to transfer safely, minor use of hands    Standing Unsupported with Eyes Closed Able to stand 10 seconds safely    Standing Unsupported with Feet Together Able to place feet together independently and stand 1 minute safely    From Standing, Reach Forward with Outstretched Arm Can reach forward >5 cm safely (2)    From Standing Position, Pick up Object from Floor Able to pick up shoe safely and easily    From Standing Position, Turn to Look Behind Over each Shoulder Turn sideways only but maintains balance    Turn 360 Degrees Able to turn 360 degrees safely one side only in 4 seconds or less    Standing Unsupported, Alternately Place Feet on Step/Stool Able to stand independently and safely and complete 8 steps in 20 seconds    Standing Unsupported, One Foot in Front Able to plae foot ahead of the other independently and hold 30 seconds    Standing on One Leg Tries to lift leg/unable to hold 3 seconds but remains standing independently    Total Score 46                    PATIENT EDUCATION: Education details: Pt educated throughout session about proper posture and technique with exercises. Improved exercise technique, movement at target joints, use of target muscles after min to mod verbal, visual, tactile cues. Person educated: Patient Education method: Explanation, demo, VC  Education comprehension: verbalized understanding and returned demonstration  HOME EXERCISE PROGRAM: Access Code: [email or web address] URL: [email or web address] Date: 07/09/2024 Prepared by: Reyes London  Exercises - Seated Scapular Retraction  - 3 x weekly - 3 sets - 10 reps - Shoulder External Rotation and Scapular Retraction with Resistance  - 3 x weekly - 3 sets - 10 reps - Standing Shoulder Row with Anchored Resistance  - 3 x weekly - 3 sets - 10  reps - Shoulder extension with resistance - Neutral  - 3 x weekly - 3 sets - 10 reps - Shoulder External Rotation and Scapular Retraction with Resistance  - 3 x weekly - 3 sets - 10 reps - Seated Thoracic Lumbar Extension  - 1 x daily - 3 sets - 10 reps - Standing Thoracic Open Book at Wall  - 3 x weekly - 3 sets - 10 reps       Access Code: [email or web address] URL: [email or web address] Date: 05/30/2024 Prepared by: Peggye Linear  Exercises - Side Stepping with Counter Support  - 2 sets - 30 reps - Heel Raises with Counter Support  - 3 sets - 15 reps - Standing March with Counter Support  - 2 sets - 30 reps - Standing Tandem Balance with Unilateral Counter Support  - 3 sets - 10 reps - 15 hold  Access Code: [email or web address] URL: [email or web address] Date: 09/25/2023 Prepared by: Massie Dollar  Exercises - Seated Hip Abduction with Resistance  - 1 x daily - 4 x weekly - 3 sets - 15 reps - 3 hold - Seated March with Resistance  - 1 x daily - 4 x weekly - 3 sets - 10 reps - 1 hold - Seated Long Arc Quad  - 1 x daily - 4 x weekly - 3 sets - 12 reps - 3 hold - Seated Hip Adduction Isometrics with Ball  - 1 x daily - 4 x weekly - 3 sets - 12 reps - 3 hold - Standing March with Counter Support  - 1 x daily - 3 x weekly - 2 sets - 10 reps - Standing Hip Abduction with Counter Support  - 1 x daily - 3 x weekly - 2 sets - 10 reps - Standing Hip Extension with Counter Support  - 1 x daily - 3 x weekly - 2 sets - 10 reps - Mini Squat with Counter Support  - 1 x daily - 3 x weekly - 2 sets - 10 reps - Standing Knee Flexion with Counter Support  - 1 x daily - 3 x weekly - 2 sets - 10 reps - Standing Tandem Balance with Counter Support  - 1 x daily - 3 x weekly - 2 sets - 4 reps - 10 hold  GOALS: Goals reviewed with patient? Yes  SHORT TERM GOALS: Target date: 12/28/23  Patient will be independent in home exercise program to improve strength/mobility for better functional  independence with ADLs. Baseline:to be given at next session; 10/23/2023- Patient reports independence with HEP provided and no significant issues.   Goal status: MET 2. Pt will be able to ambulate for at least 5 min without stopping with LRAD to allow improved access to community and return to PLOF  Baseline: 3 minutes. 2/11: 6 minute walk test completed.   Goal Status: MET  . LONG TERM GOALS: Target date: 07/16/2024  Patient will increase FOTO score to equal to or greater than 61 to demonstrate statistically significant improvement in mobility and quality of life.  Baseline: 56; 10/23/2023=  60 Goal status: PROGRESSING/DISCONTINUED   2.  Patient (> 76 years old) will complete five times sit to stand test in < 15 seconds  without UE support indicating an increased LE strength and improved balance. Baseline: 30.86 sec; 10/23/2023= 22.08 using 1 UE support.  12/11: 14.67 sec BUE from arm rest. And 14.12 with 1 UE pushing from arm rest. 01/30/24 13 seconds with UE  3/26: 15.0  able to achieve full stand on each repetition  Goal status: MET  3.  Patient will increase Berg Balance score to >45 points to demonstrate decreased fall risk during functional activities  Baseline: 26; 10/23/2023= 30 12/11: 34 01/30/24: 41/56 3/26: 40/56 04/23/2024= 44/56 06/20/2024= 46/56 07/16/2024= 46/56 Goal status: MET  4.  Patient will increase 10 meter walk test to >1.51m/s as to improve gait speed for better community ambulation and to reduce fall risk. Baseline: 0.652m/s; 10/23/2023= 0.88 m/s (normal speed with 4WW) 12/11: Average Fast speed: 1.05 m/s Average Normal speed: 0.834 m/s 01/30/24: 1.14 m/s 3/26: 1.0 m/s Goal status: MET  5.  Patient will reduce timed up and go to <11 seconds to reduce fall risk and demonstrate improved transfer/gait ability. Baseline: 29.39 12/2: 16.4 sec with Rollator  12/11: 15.19 sec with rollator  2/4: 17.38 sec with rollator  3/4: 15 sec with rollator  3/26: 13.76 sec with  rollator  04/23/2024= 12.34 sec avg with SPC 07/16/2024- 12.02 sec with Rollator Goal status: IN PROGRESS  6.  Patient will 2 min walk test by at least 67ft as to demonstrate reduced fall risk and improved dynamic gait balance for better safety with community/home ambulation.  Baseline: 272ft; 10/23/2023= 360 feet  with 4WW 329ft with 4WW Goal status: MET  7. Pt will complete 6 min walk test without stopping and improve overall score by at least 129ft to indicate increased access to community.  Baseline: to be complete 2/11:880 ft with RW; 01/30/24: 1168 ft with RW  3/26: 1131ft with Rollator  06/20/2024= 1180 feet with Rollator  Goal: MET   ASSESSMENT: CLINICAL IMPRESSION:  Patient returned for last scheduled visit today. She presents with good working knowledge of entire HEP and no questions or concerns. Her balance was reassessed and she maintained her previous score and then slightly improved with TUG. So as of today she has met all goals except TUG and presents with much improved overall mobility and appropriate for discharge from PT services today.      OBJECTIVE IMPAIRMENTS: Abnormal gait, cardiopulmonary status limiting activity, decreased activity tolerance, decreased balance, decreased endurance, decreased mobility, difficulty walking, decreased ROM, decreased strength, decreased safety awareness, hypomobility, increased fascial restrictions, impaired flexibility, improper body mechanics, and postural dysfunction.   ACTIVITY LIMITATIONS: lifting, standing, squatting, transfers, and locomotion level  PARTICIPATION LIMITATIONS: laundry, shopping, and community activity  PERSONAL FACTORS: 3+ comorbidities: scoliosis, hx of CA, and PE are also affecting patient's functional outcome.   REHAB POTENTIAL: Good  CLINICAL DECISION MAKING: Stable/uncomplicated  EVALUATION COMPLEXITY: Moderate  PLAN:  PT FREQUENCY: 1-2x/week  PT DURATION: 12 weeks  PLANNED INTERVENTIONS:  Therapeutic exercises, Therapeutic activity, Neuromuscular re-education, Balance training, Gait training, Patient/Family education, Self Care, Joint mobilization, Stair training, Vestibular training, DME instructions, Dry Needling, Spinal mobilization, Cryotherapy, Moist heat, and Manual therapy  PLAN FOR NEXT SESSION:  Discharge patient today.     Chyrl London, PT Physical Therapist - [facility] [place]  5:23 PM 07/16/24

## 2024-07-18 ENCOUNTER — Ambulatory Visit

## 2024-07-22 ENCOUNTER — Ambulatory Visit

## 2024-07-23 ENCOUNTER — Encounter: Payer: Self-pay | Admitting: Oncology

## 2024-07-23 ENCOUNTER — Inpatient Hospital Stay: Payer: Medicare Other

## 2024-07-23 ENCOUNTER — Ambulatory Visit

## 2024-07-23 ENCOUNTER — Inpatient Hospital Stay (HOSPITAL_BASED_OUTPATIENT_CLINIC_OR_DEPARTMENT_OTHER): Payer: Medicare Other | Admitting: Oncology

## 2024-07-23 VITALS — BP 93/70 | HR 75 | Temp 97.7°F | Resp 18 | Ht 60.0 in | Wt 91.2 lb

## 2024-07-23 DIAGNOSIS — D508 Other iron deficiency anemias: Secondary | ICD-10-CM | POA: Diagnosis not present

## 2024-07-23 DIAGNOSIS — Z85038 Personal history of other malignant neoplasm of large intestine: Secondary | ICD-10-CM

## 2024-07-23 DIAGNOSIS — Z08 Encounter for follow-up examination after completed treatment for malignant neoplasm: Secondary | ICD-10-CM

## 2024-07-23 DIAGNOSIS — E538 Deficiency of other specified B group vitamins: Secondary | ICD-10-CM

## 2024-07-23 LAB — CBC (CANCER CENTER ONLY)
HCT: 38.4 % (ref 36.0–46.0)
Hemoglobin: 12.4 g/dL (ref 12.0–15.0)
MCH: 31.1 pg (ref 26.0–34.0)
MCHC: 32.3 g/dL (ref 30.0–36.0)
MCV: 96.2 fL (ref 80.0–100.0)
Platelet Count: 185 K/uL (ref 150–400)
RBC: 3.99 MIL/uL (ref 3.87–5.11)
RDW: 13 % (ref 11.5–15.5)
WBC Count: 5.3 K/uL (ref 4.0–10.5)
nRBC: 0 % (ref 0.0–0.2)

## 2024-07-23 LAB — FERRITIN: Ferritin: 53 ng/mL (ref 11–307)

## 2024-07-23 LAB — IRON AND TIBC
Iron: 73 ug/dL (ref 28–170)
Saturation Ratios: 22 % (ref 10.4–31.8)
TIBC: 335 ug/dL (ref 250–450)
UIBC: 262 ug/dL

## 2024-07-24 LAB — CEA: CEA: 1.9 ng/mL (ref 0.0–4.7)

## 2024-07-25 ENCOUNTER — Ambulatory Visit

## 2024-07-29 ENCOUNTER — Encounter: Payer: Self-pay | Admitting: Oncology

## 2024-07-29 NOTE — Progress Notes (Signed)
 Hematology/Oncology Consult note [facility]  Telephone:([phone] [phone]  Patient Care Team: Sherial Bail, MD as PCP - General (Internal Medicine) Darliss Rogue, MD as PCP - Cardiology (Cardiology) Melanee Annah BROCKS, MD as Consulting Physician (Oncology)   Name of the patient: Kristina Rowe  [id number]  17-May-1930   Date of visit: 07/29/24  Diagnosis- 1. history of stage III colon cancer currently in remission  2. H/o iron deficiency anemia  Chief complaint/ Reason for visit- routine f/u of colon cancer  Heme/Onc history: Patient is a 89 year old female who initially presented to the ER with symptoms of significant abdominal pain.  CT abdomen and pelvis on 03/10/2021 showed volvulus along with a large hiatal hernia.  Patient initially had an exploratory laparotomy with lysis of adhesion by Dr. Desiderio.  She then presented back to the ER with similar complaints and a repeat CT abdomen at that time showed bulky ascending colon mass compatible with carcinoma and possible local regional adenopathy.  No recurrent volvulus.  Patient underwent open right colectomy on 03/18/2021.  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  All regional lymph nodes 32 of them were negative for tumor but there was a tumor deposit present at 1 site.  PT3PN1C.   Risks versus benefits of adjuvant chemotherapy was discussed and patient was not deemed to be a candidate for adjuvant chemotherapy and she did not wish to go through chemotherapy herself.  Anemia work-up was consistent with iron deficiency and patient received 2 doses of Feraheme     Interval history-she is doing well for her age and remains independent of her ADLs.  She has known history of hiatal hernia and she gets on and off nausea which is well-controlled with medications  ECOG PS- 2 Pain scale- 0   Review of systems- Review of  Systems  Constitutional:  Negative for chills, fever, malaise/fatigue and weight loss.  HENT:  Negative for congestion, ear discharge and nosebleeds.   Eyes:  Negative for blurred vision.  Respiratory:  Negative for cough, hemoptysis, sputum production, shortness of breath and wheezing.   Cardiovascular:  Negative for chest pain, palpitations, orthopnea and claudication.  Gastrointestinal:  Positive for heartburn and nausea. Negative for abdominal pain, blood in stool, constipation, diarrhea, melena and vomiting.  Genitourinary:  Negative for dysuria, flank pain, frequency, hematuria and urgency.  Musculoskeletal:  Negative for back pain, joint pain and myalgias.  Skin:  Negative for rash.  Neurological:  Negative for dizziness, tingling, focal weakness, seizures, weakness and headaches.  Endo/Heme/Allergies:  Does not bruise/bleed easily.  Psychiatric/Behavioral:  Negative for depression and suicidal ideas. The patient does not have insomnia.       Allergies  Allergen Reactions   Metronidazole  Other (See Comments)    Other reaction(s): Other Mouth sores Mouth sores Mouth sores  Other reaction(s): Other (See Comments) Mouth sores Other reaction(s): Other  Mouth sores    Omeprazole Other (See Comments)    Other reaction(s): Arthralgias (intolerance) Other reaction(s): Other  Other reaction(s): Arthralgias (intolerance), Other (See Comments) Other reaction(s): Arthralgias (intolerance)  Other reaction(s): Other    Bactrim  [Sulfamethoxazole -Trimethoprim ] Nausea Only   Codeine Nausea And Vomiting and Nausea Only    Other reaction(s): Nausea Only But tolerates tramadol  But tolerates tramadol Other reaction(s): Nausea Only  But tolerates tramadol    Gemtesa  [Vibegron ] Nausea Only   Propoxyphene Other (See Comments)    But tolerates tramadol and tylenol    Doxycycline  Nausea Only     Past Medical History:  Diagnosis Date   Ductal carcinoma in situ (DCIS) of left  breast    Dysphagia    Esophageal stricture    Gastroparesis    GERD (gastroesophageal reflux disease)    Hiatal hernia    Hypertension    Iron deficiency anemia    Osteoporosis    Pulmonary embolism (HCC)    Scoliosis    UTI (urinary tract infection)      Past Surgical History:  Procedure Laterality Date   BREAST LUMPECTOMY Left 2009   Ductal Carcinoma insitu    CATARACT EXTRACTION, BILATERAL     COLONOSCOPY N/A 03/17/2021   Procedure: COLONOSCOPY;  Surgeon: Maryruth Ole DASEN, MD;  Location: [facility];  Service: Endoscopy;  Laterality: N/A;   COLOSTOMY REVISION Right 03/18/2021   Procedure: COLON RESECTION RIGHT;  Surgeon: Desiderio Schanz, MD;  Location: [facility] ORS;  Service: General;  Laterality: Right;   LAPAROSCOPIC PARAESOPHAGEAL HERNIA REPAIR  2009   LAPAROTOMY N/A 03/10/2021   Procedure: EXPLORATORY LAPAROTOMY;  Surgeon: Desiderio Schanz, MD;  Location: [facility] ORS;  Service: General;  Laterality: N/A;   TUBAL LIGATION      Social History   Socioeconomic History   Marital status: Married    Spouse name: Not on file   Number of children: Not on file   Years of education: Not on file   Highest education level: Not on file  Occupational History   Not on file  Tobacco Use   Smoking status: Never    Passive exposure: Never   Smokeless tobacco: Never  Vaping Use   Vaping status: Never Used  Substance and Sexual Activity   Alcohol use: Never   Drug use: Never   Sexual activity: Not Currently  Other Topics Concern   Not on file  Social History Narrative   Not on file   Social Drivers of Health   Financial Resource Strain: Low Risk  (10/04/2023)   Received from [facility]   Overall Financial Resource Strain (CARDIA)    Difficulty of Paying Living Expenses: Not hard at all  Food Insecurity: No Food Insecurity (10/04/2023)   Received from [facility]   Hunger Vital Sign    Within the past 12 months, you worried that your food  would run out before you got the money to buy more.: Never true    Within the past 12 months, the food you bought just didn't last and you didn't have money to get more.: Never true  Transportation Needs: No Transportation Needs (10/04/2023)   Received from [facility] - Transportation    In the past 12 months, has lack of transportation kept you from medical appointments or from getting medications?: No    Lack of Transportation (Non-Medical): No  Physical Activity: Not on file  Stress: Not on file  Social Connections: Not on file  Intimate Partner Violence: Not on file    Family History  Problem Relation Age of Onset   Colon cancer Mother    Emphysema Father    Cholelithiasis Sister    Factor V Leiden deficiency Sister    Thyroid  disease Sister    Cholelithiasis Sister    Pulmonary embolism Sister    Deep vein thrombosis Brother    Liver cancer Brother    Stroke Brother        died age 75 June 2022, [employer]Day veteran   Cancer Brother  oral cancer   Breast cancer Maternal Uncle      Current Outpatient Medications:    acetaminophen  (TYLENOL ) 500 MG tablet, Take 500 mg by mouth at bedtime as needed for mild pain, fever or headache., Disp: , Rfl:    albuterol  (VENTOLIN  HFA) 108 (90 Base) MCG/ACT inhaler, Inhale into the lungs., Disp: , Rfl:    amLODipine  (NORVASC ) 2.5 MG tablet, Take by mouth., Disp: , Rfl:    Cholecalciferol  50 MCG (2000 UT) CAPS, Take by mouth., Disp: , Rfl:    estradiol  (ESTRACE ) 0.1 MG/GM vaginal cream, Estrogen Cream Instruction Discard applicator Apply pea sized amount to tip of finger to urethra before bed. Wash hands well after application. Use Monday, Wednesday and Friday, Disp: 42.5 g, Rfl: 5   famotidine  (PEPCID ) 40 MG tablet, Take 40 mg by mouth 2 (two) times daily., Disp: , Rfl:    furosemide  (LASIX ) 20 MG tablet, Take 1 tablet (20 mg total) by mouth daily as needed for fluid or edema., Disp: 30 tablet, Rfl: 2    mirtazapine  (REMERON ) 7.5 MG tablet, Take 7.5 mg by mouth at bedtime., Disp: , Rfl:    ondansetron  (ZOFRAN -ODT) 4 MG disintegrating tablet, Take 1 tablet (4 mg total) by mouth every 8 (eight) hours as needed., Disp: 45 tablet, Rfl: 1   polyethylene glycol (MIRALAX  / GLYCOLAX ) 17 g packet, Take 17 g by mouth daily., Disp: , Rfl:    rivaroxaban  (XARELTO ) 10 MG TABS tablet, Take 10 mg by mouth daily., Disp: , Rfl:    traMADol (ULTRAM) 50 MG tablet, Take 50 mg by mouth 3 (three) times daily as needed., Disp: , Rfl:    folic acid  (FOLVITE ) 1 MG tablet, Take 1 mg by mouth daily. (Patient not taking: Reported on 07/23/2024), Disp: , Rfl:   Physical exam:  Vitals:   07/23/24 1413  BP: 93/70  Pulse: 75  Resp: 18  Temp: 97.7 F (36.5 C)  TempSrc: Tympanic  SpO2: 96%  Weight: 91 lb 3.2 oz (41.4 kg)  Height: 5' (1.524 m)   Physical Exam Constitutional:      Comments: Ambulates with a walker.  Appears in no acute distress.  She has chronic kyphosis  Cardiovascular:     Rate and Rhythm: Normal rate and regular rhythm.     Heart sounds: Normal heart sounds.  Pulmonary:     Effort: Pulmonary effort is normal.     Breath sounds: Normal breath sounds.  Abdominal:     General: Bowel sounds are normal.     Palpations: Abdomen is soft.  Skin:    General: Skin is warm and dry.  Neurological:     Mental Status: She is alert and oriented to person, place, and time.      I have personally reviewed labs listed below:    Latest Ref Rng & Units 01/03/2024    1:38 PM  CMP  Glucose 70 - 99 mg/dL 78   BUN 8 - 23 mg/dL 14   Creatinine 9.55 - 1.00 mg/dL 9.12   Sodium 864 - 854 mmol/L 136   Potassium 3.5 - 5.1 mmol/L 3.7   Chloride 98 - 111 mmol/L 95   CO2 22 - 32 mmol/L 31   Calcium  8.9 - 10.3 mg/dL 9.7   Total Protein 6.5 - 8.1 g/dL 7.8   Total Bilirubin 0.0 - 1.2 mg/dL 0.8   Alkaline Phos 38 - 126 U/L 63   AST 15 - 41 U/L 21   ALT 0 - 44 U/L  13       Latest Ref Rng & Units 07/23/2024    1:55  PM  CBC  WBC 4.0 - 10.5 K/uL 5.3   Hemoglobin 12.0 - 15.0 g/dL 87.5   Hematocrit 63.9 - 46.0 % 38.4   Platelets 150 - 400 K/uL 185    I have personally reviewed Radiology images listed below: No images are attached to the encounter.  CT CHEST ABDOMEN PELVIS WO CONTRAST Result Date: 07/20/2024 CLINICAL DATA:  Colon cancer surveillance, status post resection * Tracking Code: BO * EXAM: CT CHEST, ABDOMEN AND PELVIS WITHOUT CONTRAST TECHNIQUE: Multidetector CT imaging of the chest, abdomen and pelvis was performed following the standard protocol without IV contrast. Oral enteric contrast was administered. RADIATION DOSE REDUCTION: This exam was performed according to the departmental dose-optimization program which includes automated exposure control, adjustment of the mA and/or kV according to patient size and/or use of iterative reconstruction technique. COMPARISON:  12/27/2023 FINDINGS: CT CHEST FINDINGS Cardiovascular: Aortic atherosclerosis. Aortic valve calcifications. Normal heart size. Three-vessel coronary artery calcifications no pericardial effusion. Mediastinum/Nodes: No enlarged mediastinal, hilar, or axillary lymph nodes. Moderate hiatal hernia with intrathoracic position of the gastric fundus. Thyroid  gland, trachea, and esophagus demonstrate no significant findings. Lungs/Pleura: Bibasilar scarring or atelectasis. No pleural effusion or pneumothorax. Musculoskeletal: No chest wall abnormality. No acute osseous findings. CT ABDOMEN PELVIS FINDINGS Hepatobiliary: No solid liver abnormality is seen. No gallstones, gallbladder wall thickening, or biliary dilatation. Pancreas: Unremarkable. No pancreatic ductal dilatation or surrounding inflammatory changes. Spleen: Normal in size without significant abnormality. Adrenals/Urinary Tract: Adrenal glands are unremarkable. Kidneys are normal, without renal calculi, solid lesion, or hydronephrosis. Bladder is unremarkable. Stomach/Bowel: Stomach is  within normal limits. Partial right hemicolectomy. No evidence of bowel wall thickening, distention, or inflammatory changes. Sigmoid diverticulosis. Vascular/Lymphatic: Aortic atherosclerosis. No enlarged abdominal or pelvic lymph nodes. Reproductive: No mass or other abnormality. Other: No abdominal wall hernia or abnormality. No ascites. Musculoskeletal: No acute osseous findings. Severe levoscoliosis of the thoracolumbar spine. IMPRESSION: 1. Partial right hemicolectomy. 2. No noncontrast evidence of recurrent or metastatic disease in the chest, abdomen, or pelvis. 3. Moderate hiatal hernia with intrathoracic position of the gastric fundus. 4. Coronary artery disease. 5. Aortic valve calcifications. Correlate for echocardiographic evidence of aortic valve dysfunction. Aortic Atherosclerosis (ICD10-I70.0). Electronically Signed   By: Marolyn JONETTA Jaksch M.D.   On: 07/20/2024 07:08     Assessment and plan- Patient is a 89 y.o. female with history of stage III colon cancer s/p right colectomy in April 2022.  This is a routine surveillance visit for colon Cancer I have reviewed CT chest abdomen and pelvis images independently and discussed findings with the patient which does not show any evidence of recurrent or progressive disease.  She is now 3 years out of her diagnosis of colon cancer.  I feel comfortable doing her scans on a yearly basis.  Clinically she is doing well with no concerning signs and symptoms of recurrence based on today's exam.  Hemoglobin ferritin and iron studies are within normal limits   Visit Diagnosis 1. Other iron deficiency anemia   2. Encounter for follow-up surveillance of colon cancer      Dr. Annah Skene, MD, MPH [facility] [phone] 07/29/2024 6:08 PM

## 2024-07-30 ENCOUNTER — Encounter: Payer: Self-pay | Admitting: Oncology

## 2024-07-30 ENCOUNTER — Other Ambulatory Visit: Payer: Self-pay

## 2024-07-30 DIAGNOSIS — D508 Other iron deficiency anemias: Secondary | ICD-10-CM

## 2024-07-30 DIAGNOSIS — Z08 Encounter for follow-up examination after completed treatment for malignant neoplasm: Secondary | ICD-10-CM

## 2024-07-31 ENCOUNTER — Ambulatory Visit

## 2024-08-07 ENCOUNTER — Ambulatory Visit

## 2024-08-12 ENCOUNTER — Ambulatory Visit

## 2024-08-14 ENCOUNTER — Ambulatory Visit

## 2024-08-19 ENCOUNTER — Ambulatory Visit

## 2024-08-21 ENCOUNTER — Ambulatory Visit

## 2024-08-26 ENCOUNTER — Ambulatory Visit

## 2024-08-28 ENCOUNTER — Ambulatory Visit

## 2024-09-27 ENCOUNTER — Encounter: Payer: Self-pay | Admitting: Oncology

## 2024-09-27 NOTE — Telephone Encounter (Signed)
 Encounter opened in error.

## 2024-10-05 ENCOUNTER — Emergency Department (HOSPITAL_COMMUNITY)

## 2024-10-05 ENCOUNTER — Emergency Department (HOSPITAL_COMMUNITY)
Admission: EM | Admit: 2024-10-05 | Discharge: 2024-10-05 | Disposition: A | Discharge status: Home / Self Care | Attending: Emergency Medicine | Admitting: Emergency Medicine

## 2024-10-05 ENCOUNTER — Other Ambulatory Visit: Payer: Self-pay

## 2024-10-05 DIAGNOSIS — W01198A Fall on same level from slipping, tripping and stumbling with subsequent striking against other object, initial encounter: Secondary | ICD-10-CM | POA: Diagnosis not present

## 2024-10-05 DIAGNOSIS — S0990XA Unspecified injury of head, initial encounter: Secondary | ICD-10-CM

## 2024-10-05 DIAGNOSIS — S5001XA Contusion of right elbow, initial encounter: Secondary | ICD-10-CM | POA: Diagnosis not present

## 2024-10-05 DIAGNOSIS — S0083XA Contusion of other part of head, initial encounter: Secondary | ICD-10-CM | POA: Insufficient documentation

## 2024-10-05 DIAGNOSIS — W19XXXA Unspecified fall, initial encounter: Secondary | ICD-10-CM

## 2024-10-05 DIAGNOSIS — M25521 Pain in right elbow: Secondary | ICD-10-CM

## 2024-10-05 DIAGNOSIS — M542 Cervicalgia: Secondary | ICD-10-CM | POA: Diagnosis not present

## 2024-10-05 DIAGNOSIS — Z7901 Long term (current) use of anticoagulants: Secondary | ICD-10-CM | POA: Diagnosis not present

## 2024-10-05 MED ORDER — LIDOCAINE 5 % EX PTCH
2.0000 | MEDICATED_PATCH | CUTANEOUS | Status: DC
  Administered 2024-10-05: 2 via TRANSDERMAL
  Filled 2024-10-05: qty 2

## 2024-10-05 MED ORDER — ACETAMINOPHEN 500 MG PO TABS: 1000.0000 mg | ORAL_TABLET | Freq: Four times a day (QID) | ORAL | 0 refills | Status: AC | PRN

## 2024-10-05 MED ORDER — LIDOCAINE 5 % EX PTCH: 1.0000 | MEDICATED_PATCH | CUTANEOUS | 0 refills | Status: AC

## 2024-10-05 MED ORDER — ACETAMINOPHEN 500 MG PO TABS
1000.0000 mg | ORAL_TABLET | ORAL | Status: AC
  Administered 2024-10-05: 1000 mg via ORAL
  Filled 2024-10-05: qty 2

## 2024-10-05 NOTE — Discharge Instructions (Addendum)
 Take Tylenol  1000 g every 6 hours Drink plenty of water Follow-up with your primary care doctor as needed Return to the emergency room for any new or concerning symptoms such as confusion, multiple episodes of vomiting, any other new or concerning symptoms Keep making that honey

## 2024-10-05 NOTE — ED Triage Notes (Signed)
 Pt had mech fall while ambulating to bathroom this morning. Pt hit head on floor when she fell. No LOC. Pt takes xarelto  10 mg. Endorses headache and neck pain 3/10. Pt has hematoma to right eye. 148/82 Initial BP  EMS VS arrival 138/77 98.1  spO2 100 2L 76 CBG

## 2024-10-05 NOTE — ED Provider Notes (Signed)
 [place] EMERGENCY DEPARTMENT AT [facility] Provider Note   CSN: [id number] Arrival date & time: 10/05/24  9080     Patient presents with: Kristina Rowe is a 89 y.o. female.  {Add pertinent medical, surgical, social history, OB history to YEP:67052}  Fall          Prior to Admission medications   Medication Sig Start Date End Date Taking? Authorizing Provider  acetaminophen  (TYLENOL ) 500 MG tablet Take 500 mg by mouth at bedtime as needed for mild pain, fever or headache.    [provider]  albuterol  (VENTOLIN  HFA) 108 (90 Base) MCG/ACT inhaler Inhale into the lungs. 12/01/23 11/30/24  [provider]  amLODipine  (NORVASC ) 2.5 MG tablet Take by mouth. 12/15/23 12/14/24  [provider]  Cholecalciferol  50 MCG (2000 UT) CAPS Take by mouth.    [provider]  estradiol  (ESTRACE ) 0.1 MG/GM vaginal cream Estrogen Cream Instruction Discard applicator Apply pea sized amount to tip of finger to urethra before bed. Wash hands well after application. Use Monday, Wednesday and Friday 11/27/23   Vaillancourt, Samantha, PA-C  famotidine  (PEPCID ) 40 MG tablet Take 40 mg by mouth 2 (two) times daily. 06/15/22   [provider]  folic acid  (FOLVITE ) 1 MG tablet Take 1 mg by mouth daily. Patient not taking: Reported on 07/23/2024 01/17/19   [provider]  furosemide  (LASIX ) 20 MG tablet Take 1 tablet (20 mg total) by mouth daily as needed for fluid or edema. 08/18/23 08/17/24  Darci Pore, MD  ondansetron  (ZOFRAN -ODT) 4 MG disintegrating tablet Take 1 tablet (4 mg total) by mouth every 8 (eight) hours as needed. 08/04/22   Borders, Fonda SAUNDERS, NP  polyethylene glycol (MIRALAX  / GLYCOLAX ) 17 g packet Take 17 g by mouth daily.    [provider]  rivaroxaban  (XARELTO ) 10 MG TABS tablet Take 10 mg by mouth daily.    [provider]  traMADol (ULTRAM) 50 MG tablet Take 50 mg by mouth 3 (three) times daily as  needed. 05/02/24   [provider]    Allergies: Metronidazole , Omeprazole, Bactrim  [sulfamethoxazole -trimethoprim ], Codeine, Gemtesa  [vibegron ], Propoxyphene, and Doxycycline    Review of Systems  Updated Vital Signs BP (!) 140/72   Temp 97.6 F (36.4 C) (Oral)   Ht 5' (1.524 m)   Wt 42 kg   BMI 18.08 kg/m   Physical Exam  (all labs ordered are listed, but only abnormal results are displayed) Labs Reviewed - No data to display  EKG: None  Radiology: No results found.  {Document cardiac monitor, telemetry assessment procedure when appropriate:32947} Procedures   Medications Ordered in the ED - No data to display    {Click here for ABCD2, HEART and other calculators REFRESH Note before signing:1}                              Medical Decision Making Amount and/or Complexity of Data Reviewed Radiology: ordered.   ***  {Document critical care time when appropriate  Document review of labs and clinical decision tools ie CHADS2VASC2, etc  Document your independent review of radiology images and any outside records  Document your discussion with family members, caretakers and with consultants  Document social determinants of health affecting pt's care  Document your decision making why or why not admission, treatments were needed:32947:::1}   Final diagnoses:  None    ED Discharge Orders  None

## 2024-10-05 NOTE — Progress Notes (Signed)
 Orthopedic Tech Progress Note Patient Details:  Kristina Rowe Nov 24, 1930 [id number]  Patient ID: Kristina Rowe, female   DOB: 1930-07-20, 89 y.o.   MRN: [id number] Checked in for level 2 trauma.  Morna Pink 10/05/2024, 9:34 AM

## 2024-11-06 ENCOUNTER — Ambulatory Visit: Attending: Internal Medicine

## 2024-11-06 DIAGNOSIS — R2681 Unsteadiness on feet: Secondary | ICD-10-CM | POA: Diagnosis present

## 2024-11-06 DIAGNOSIS — R262 Difficulty in walking, not elsewhere classified: Secondary | ICD-10-CM

## 2024-11-06 DIAGNOSIS — R278 Other lack of coordination: Secondary | ICD-10-CM

## 2024-11-06 DIAGNOSIS — M5459 Other low back pain: Secondary | ICD-10-CM | POA: Diagnosis present

## 2024-11-06 DIAGNOSIS — M6281 Muscle weakness (generalized): Secondary | ICD-10-CM | POA: Insufficient documentation

## 2024-11-06 DIAGNOSIS — R2689 Other abnormalities of gait and mobility: Secondary | ICD-10-CM | POA: Insufficient documentation

## 2024-11-06 NOTE — Therapy (Signed)
 OUTPATIENT PHYSICAL THERAPY NEURO EVALUATION   Patient Name: Kristina Rowe MRN: [id number] DOB:19-May-1930, 89 y.o., female Today's Date: 11/07/2024   PCP: Dr. Lavenia Beaver  REFERRING PROVIDER: Dr. Lavenia Beaver  END OF SESSION:  PT End of Session - 11/06/24 1455     Visit Number 1    Number of Visits 24    Date for Recertification  01/29/25    Progress Note Due on Visit 10    PT Start Time 1447    PT Stop Time 1532    PT Time Calculation (min) 45 min    Equipment Utilized During Treatment Gait belt    Activity Tolerance Patient tolerated treatment well;No increased pain    Behavior During Therapy WFL for tasks assessed/performed          Past Medical History:  Diagnosis Date   Ductal carcinoma in situ (DCIS) of left breast    Dysphagia    Esophageal stricture    Gastroparesis    GERD (gastroesophageal reflux disease)    Hiatal hernia    Hypertension    Iron deficiency anemia    Osteoporosis    Pulmonary embolism (HCC)    Scoliosis    UTI (urinary tract infection)    Past Surgical History:  Procedure Laterality Date   BREAST LUMPECTOMY Left 2009   Ductal Carcinoma insitu    CATARACT EXTRACTION, BILATERAL     COLONOSCOPY N/A 03/17/2021   Procedure: COLONOSCOPY;  Surgeon: Maryruth Ole DASEN, MD;  Location: [facility];  Service: Endoscopy;  Laterality: N/A;   COLOSTOMY REVISION Right 03/18/2021   Procedure: COLON RESECTION RIGHT;  Surgeon: Desiderio Schanz, MD;  Location: [facility];  Service: General;  Laterality: Right;   LAPAROSCOPIC PARAESOPHAGEAL HERNIA REPAIR  2009   LAPAROTOMY N/A 03/10/2021   Procedure: EXPLORATORY LAPAROTOMY;  Surgeon: Desiderio Schanz, MD;  Location: [facility];  Service: General;  Laterality: N/A;   TUBAL LIGATION     Patient Active Problem List   Diagnosis Date Noted   Acute on chronic diastolic CHF (congestive heart failure) (HCC) 08/19/2023   Acute respiratory failure with hypoxia (HCC) 08/17/2023   History of pulmonary  embolus (PE) 08/17/2023   Basal cell carcinoma of leg 04/01/2021   Coronary atherosclerosis 04/01/2021   History of pulmonary embolism 04/01/2021   Pulmonary nodule 04/01/2021   Malignant neoplasm of colon (HCC) 03/29/2021   Rectal bleeding 03/15/2021   Hypertension    GERD (gastroesophageal reflux disease)    Colonic mass    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HCC) 03/10/2021   Anemia, unspecified 03/06/2021   Bronchiectasis without complication (HCC) 03/27/2020   Iron deficiency anemia 03/27/2020   Moderate aortic stenosis 03/27/2020   Stage 3a chronic kidney disease (HCC) 07/04/2019   Hypnic headache 05/28/2019   Telogen effluvium 04/17/2019   Xerosis cutis 04/17/2019   Essential hypertension 01/14/2019   Cervical radiculopathy 05/04/2018   Osteoarthritis of left glenohumeral joint 05/04/2018   Vitamin B12 deficiency 01/01/2018   Vascular abnormality 09/11/2017   Dyspepsia 03/28/2017   Pulmonary embolism (HCC) 08/16/2016   Dilated pore of [place] 03/23/2015   Retinal drusen of both eyes 08/28/2014   Status post right cataract extraction 07/30/2014   Verruca 03/19/2014   Chronic back pain 11/06/2013   Postmenopausal osteoporosis 06/11/2013   Anticoagulated on Coumadin  04/06/2013   Long term (current) use of anticoagulants 10/18/2012   Preglaucoma 05/10/2012   Presbyopia 05/10/2012   Senile nuclear sclerosis 05/10/2012   Lobular carcinoma in situ of breast 04/13/2012   Closed fracture of  ankle 02/27/2012   History of nonmelanoma skin cancer 09/05/2011   Other benign neoplasm of skin of trunk 09/05/2011   Osteoporosis 08/25/2011   Transient alteration of awareness 09/03/2009   Colon adenoma 07/30/2002    ONSET DATE: 10/05/2024  REFERRING DIAG: Essential HTN; Fall at home; High Risk for Falls  THERAPY DIAG:  Muscle weakness (generalized)  Difficulty in walking, not elsewhere classified  Unsteadiness on feet  Other abnormalities of gait and mobility  Other low back  pain  Other lack of coordination  Rationale for Evaluation and Treatment: Rehabilitation  SUBJECTIVE:                                                                                                                                                                                             SUBJECTIVE STATEMENT: Patient reports falling in home stating her knees went out while changing clothes- fell into wall hitting her head.  Reports decreased confidence now in her balance and feels like she is not walking as well. States went for f/u after ED with her PCP and received an order to return to PT.   Pt accompanied by: self  PERTINENT HISTORY: Per hospital ED Note on 10/05/2024: Patient is a 89 year old female present emergency room today after mechanical fall that occurred when she pulled down her underwear use the restroom and then stood up and tripped she struck her right forehead against a wall and crumpled forwards she denies any chest pain or difficulty breathing no abdominal pain states that her only pain is mild headache and some right elbow pain. She also has some neck pain. No back pain however.   PAIN:  Are you having pain? Yes: NPRS scale: 6/10 lower throracic and LBP Pain location: Yes: NPRS scale: 6/10 lower throracic and LBP Pain description: sharp pain Aggravating factors: prolonged standing Relieving factors: Rest, walking, sit and rest   PRECAUTIONS: Fall  RED FLAGS: None   WEIGHT BEARING RESTRICTIONS: No  FALLS: Has patient fallen in last 6 months? Yes. Number of falls 1  LIVING ENVIRONMENT: Lives with: lives alone Lives in: House/apartment Stairs: Yes: External: 1- from garage  steps; none Has following equipment at home: Single point cane, Environmental Consultant - 4 wheeled, Wheelchair (manual), and Tour manager  PLOF: Independent- Driving, Independent with all household and community mobility with use of device.  PATIENT GOALS: To be able to stand longer and feel secure with  walking and less fearful of falling  OBJECTIVE:  Note: Objective measures were completed at Evaluation unless otherwise noted.  DIAGNOSTIC FINDINGS:  CT of C-spine on 11/8:IMPRESSION: 1. No acute traumatic injury identified in  the cervical spine. 2. Cervical spine degeneration with probable mild  spinal stenosis.   Electronically signed by: Helayne Hurst MD 10/05/2024 10:24 AM EST RP  X-ray of pelvis 10/05/2024= IMPRESSION: 1. No acute fracture or dislocation identified about the pelvis.  COGNITION: Overall cognitive status: Within functional limits for tasks assessed   SENSATION: WFL  COORDINATION: Intact heel to shin bilateral  EDEMA:  None observed   POSTURE: rounded shoulders, forward head, increased thoracic kyphosis, and Severe flexed trunk with scoliosis  LOWER EXTREMITY ROM:     Active  Right Eval Left Eval  Hip flexion    Hip extension    Hip abduction    Hip adduction    Hip internal rotation    Hip external rotation    Knee flexion    Knee extension    Ankle dorsiflexion    Ankle plantarflexion    Ankle inversion    Ankle eversion     (Blank rows = not tested)  LOWER EXTREMITY MMT:    MMT Right Eval Left Eval  Hip flexion 4 4  Hip extension 4 4  Hip abduction 4 4  Hip adduction 4 4  Hip internal rotation 3+ 3+  Hip external rotation 3+ 3+  Knee flexion 4 4  Knee extension Lacking 44 deg from zero- 2-/5 4-  Ankle dorsiflexion 4 4  Ankle plantarflexion    Ankle inversion    Ankle eversion    (Blank rows = not tested)  BED MOBILITY:  Not tested  TRANSFERS: Sit to stand: CGA  Assistive device utilized: Environmental Consultant - 4 wheeled     Stand to sit: CGA  Assistive device utilized: Environmental Consultant - 4 wheeled      RAMP:  Not tested  CURB:  Not tested  STAIRS: Not tested GAIT: Findings: Gait Characteristics: step through pattern, decreased arm swing- Right, decreased arm swing- Left, decreased step length- Right, and decreased step length- Left,  Distance walked: approx 180 feet, Assistive device utilized:Walker - 4 wheeled, Level of assistance: CGA, and Comments: severe forward flexed posture due to scoliosis  FUNCTIONAL TESTS:  30 seconds chair stand test= 6 reps with RUE SUpport Timed up and go (TUG): 22.09 sec 6 minute walk test: to be assessed 2nd visit 10 meter walk test: 12.34 sec/ 12.10 sec and fast= 10.75 and 11.1 sec  Berg Balance Scale:  Item Test date: 11/06/2024 Date:  Date:   Sitting to standing 3. able to stand independently using hands Insert SmartPhrase OPRCBERGREEVAL Insert SmartPhrase OPRCBERGREEVAL  2. Standing unsupported 4. able to stand safely for 2 minutes    3. Sitting with back unsupported, feet supported 4. able to sit safely and securely for 2 minutes    4. Standing to sitting 3. controls descent by using hands    5. Pivot transfer  3. able to transfer safely with definite need of hands    6. Standing unsupported with eyes closed 3. able to stand 10 seconds with supervision    7. Standing unsupported with feet together 3. able to place feet together independently and stand 1 minute with supervision    8. Reaching forward with outstretched arms while standing 2. can reach forward 5 cm (2 inches)    9. Pick up object from the floor from standing 0. unable to try/needs assistance to keep from losing balance or falling    10. Turning to look behind over left and right shoulders while standing 2. turns sideways but only maintains balance    11.  Turn 360 degrees 0. needs assistance while turning    12. Place alternate foot on step or stool while standing unsupported 1. able to complete > 2 steps needs minimal assist    13. Standing unsupported one foot in front 0. loses balance while stepping or standing    14. Standing on one leg 0. unable to try of needs assist to prevent fall      Total Score 28/56 Total Score:    Total Score:      PATIENT SURVEYS:  ABC scale: The Activities-Specific Balance Confidence  (ABC) Scale 0% 10 20 30  40 50 60 70 80 90 100% No confidence<->completely confident  How confident are you that you will not lose your balance or become unsteady when you . . .   Date tested 11/06/2024  Walk around the house 60%  2. Walk up or down stairs 30%  3. Bend over and pick up a slipper from in front of a closet floor 0%  4. Reach for a small can off a shelf at eye level 90%  5. Stand on tip toes and reach for something above your head 0%  6. Stand on a chair and reach for something 0%  7. Sweep the floor 90%  8. Walk outside the house to a car parked in the driveway 80%  9. Get into or out of a car 100  10. Walk across a parking lot to the mall 80%  11. Walk up or down a ramp 90%  12. Walk in a crowded mall where people rapidly walk past you 80%  13. Are bumped into by people as you walk through the mall 30%  14. Step onto or off of an escalator while you are holding onto the railing 0%  15. Step onto or off an escalator while holding onto parcels such that you cannot hold onto the railing 0%  16. Walk outside on icy sidewalks 0%  Total: #/16 45.62                                                                                                                                 TREATMENT DATE: 11/06/2024  PT EVALUATION as above  Self care/home management:  Discussed home safety - using walker for now based on BERG results. Discussed keeping room well lit and pathways cleared. Instructed her to continue to perform HEP from recent previous episode of care and that PT would be modifying her program based on needs.    PATIENT EDUCATION: Education details: purpose of PT; plan of care; Discussed fear of falling and factor it may play in rehab. Person educated: Patient Education method: Explanation Education comprehension: verbalized understanding  HOME EXERCISE PROGRAM: To be initiated next visit (patient has existing program) - will need to review and potentially  modify  GOALS: Goals reviewed with patient? Yes  SHORT TERM GOALS: Target date: 12/19/2024  Pt will be independent with HEP in order  to improve strength and balance in order to decrease fall risk and improve function at home and work.  Baseline: EVAL- No formal HEP in place Goal status: INITIAL  LONG TERM GOALS: Target date: 01/30/2024  1.  Patient will complete >10 reps of 30 sec SIT TO STAND Test (using min 1 UE support or no Support) indicating an increased LE strength and improved balance. Baseline: EVAL= 6 reps with 1 UE support Goal status: INITIAL  2.  Patient will improve ABC score by 15 percentage points to demonstrate statistically significant improvement in mobility and quality of life as it relates to their confidence in balance.  Baseline: EVAL- 45.62% Goal status: INITIAL   3.  Patient will increase Berg Balance score by > 6 points to demonstrate decreased fall risk during functional activities. Baseline: EVAL= 28/56 Goal status: INITIAL   4.   Patient will reduce timed up and go to <11 seconds to reduce fall risk and demonstrate improved transfer/gait ability. Baseline: EVAL-22.09 sec with 4WW Goal status: INITIAL  5.   Patient will increase 10 meter walk test to >1.51m/s as to improve gait speed for better community ambulation and to reduce fall risk. Baseline: EVAL=0.82 m/s with 4WW Goal status: INITIAL  6.   Patient will increase six minute walk test distance to >1000 for progression to community ambulator and improve gait ability Baseline: To be assessed visit #2 Goal status: INITIAL  ASSESSMENT:  CLINICAL IMPRESSION: Patient is a 89 y.o. female who was seen today for physical therapy evaluation and treatment for essential HTN and recent fall. Patient had a fall on 11/8 and no acute fractures or injuries other than hitting her head. She feels like her balance overall has worsened and now has an increased risk of falling. She presents with impairments  identified today including LE muscle weakness, poor posture, and impaired balance with increased risk of falling as seen with TUG and BERG scores. Patient will benefit from skilled PT services to assist with muscle strength, functional mobility and balance to improve her functional capabilities and mobility with decreased risk of falling.   OBJECTIVE IMPAIRMENTS: Abnormal gait, decreased activity tolerance, decreased balance, decreased coordination, decreased endurance, decreased mobility, difficulty walking, decreased strength, impaired flexibility, postural dysfunction, and pain.   ACTIVITY LIMITATIONS: carrying, lifting, bending, sitting, standing, squatting, sleeping, stairs, transfers, bed mobility, bathing, dressing, and hygiene/grooming  PARTICIPATION LIMITATIONS: meal prep, cleaning, laundry, shopping, community activity, and yard work  PERSONAL FACTORS: Age and 1 comorbidity: scoliosis and fear of falling are also affecting patient's functional outcome.   REHAB POTENTIAL: Good  CLINICAL DECISION MAKING: Stable/uncomplicated  EVALUATION COMPLEXITY: Low  PLAN:  PT FREQUENCY: 1-2x/week  PT DURATION: 12 weeks  PLANNED INTERVENTIONS: [id number]- PT Re-evaluation, 97750- Physical Performance Testing, 97110-Therapeutic exercises, 97530- Therapeutic activity, [id number]- Neuromuscular re-education, 97535- Self Care, [id number]- Manual therapy, [id number]- Gait training, [id number]- Orthotic Initial, [id number]- Orthotic/Prosthetic subsequent, [phone]- Canalith repositioning, [id number]- Electrical stimulation (manual), [phone] (1-2 muscles), [id number] (3+ muscles)- Dry Needling, Patient/Family education, Balance training, Stair training, Taping, Joint mobilization, Vestibular training, DME instructions, Cryotherapy, and Moist heat  PLAN FOR NEXT SESSION:  6 min walk Initiate balance training in clinic - static and dynamic and add to HEP as appropriate.   Reyes LOISE London, PT 11/07/2024, 10:14 AM

## 2024-11-13 ENCOUNTER — Ambulatory Visit

## 2024-11-13 DIAGNOSIS — R278 Other lack of coordination: Secondary | ICD-10-CM

## 2024-11-13 DIAGNOSIS — M6281 Muscle weakness (generalized): Secondary | ICD-10-CM

## 2024-11-13 DIAGNOSIS — R2681 Unsteadiness on feet: Secondary | ICD-10-CM

## 2024-11-13 DIAGNOSIS — R262 Difficulty in walking, not elsewhere classified: Secondary | ICD-10-CM

## 2024-11-13 DIAGNOSIS — R2689 Other abnormalities of gait and mobility: Secondary | ICD-10-CM

## 2024-11-13 DIAGNOSIS — M5459 Other low back pain: Secondary | ICD-10-CM

## 2024-11-13 NOTE — Therapy (Unsigned)
 OUTPATIENT PHYSICAL THERAPY NEURO TREATMENT   Patient Name: AKUA BLETHEN MRN: [id number] DOB:1930/09/19, 89 y.o., female Today's Date: 11/14/2024   PCP: Dr. Lavenia Beaver  REFERRING PROVIDER: Dr. Lavenia Beaver  END OF SESSION:  PT End of Session - 11/13/24 1410     Visit Number 2    Number of Visits 24    Date for Recertification  01/29/25    Progress Note Due on Visit 10    PT Start Time 1405    PT Stop Time 1446    PT Time Calculation (min) 41 min    Equipment Utilized During Treatment Gait belt    Activity Tolerance Patient tolerated treatment well;No increased pain    Behavior During Therapy WFL for tasks assessed/performed          Past Medical History:  Diagnosis Date   Ductal carcinoma in situ (DCIS) of left breast    Dysphagia    Esophageal stricture    Gastroparesis    GERD (gastroesophageal reflux disease)    Hiatal hernia    Hypertension    Iron deficiency anemia    Osteoporosis    Pulmonary embolism (HCC)    Scoliosis    UTI (urinary tract infection)    Past Surgical History:  Procedure Laterality Date   BREAST LUMPECTOMY Left 2009   Ductal Carcinoma insitu    CATARACT EXTRACTION, BILATERAL     COLONOSCOPY N/A 03/17/2021   Procedure: COLONOSCOPY;  Surgeon: Maryruth Ole DASEN, MD;  Location: [facility];  Service: Endoscopy;  Laterality: N/A;   COLOSTOMY REVISION Right 03/18/2021   Procedure: COLON RESECTION RIGHT;  Surgeon: Desiderio Schanz, MD;  Location: [facility];  Service: General;  Laterality: Right;   LAPAROSCOPIC PARAESOPHAGEAL HERNIA REPAIR  2009   LAPAROTOMY N/A 03/10/2021   Procedure: EXPLORATORY LAPAROTOMY;  Surgeon: Desiderio Schanz, MD;  Location: [facility];  Service: General;  Laterality: N/A;   TUBAL LIGATION     Patient Active Problem List   Diagnosis Date Noted   Acute on chronic diastolic CHF (congestive heart failure) (HCC) 08/19/2023   Acute respiratory failure with hypoxia (HCC) 08/17/2023   History of pulmonary  embolus (PE) 08/17/2023   Basal cell carcinoma of leg 04/01/2021   Coronary atherosclerosis 04/01/2021   History of pulmonary embolism 04/01/2021   Pulmonary nodule 04/01/2021   Malignant neoplasm of colon (HCC) 03/29/2021   Rectal bleeding 03/15/2021   Hypertension    GERD (gastroesophageal reflux disease)    Colonic mass    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HCC) 03/10/2021   Anemia, unspecified 03/06/2021   Bronchiectasis without complication (HCC) 03/27/2020   Iron deficiency anemia 03/27/2020   Moderate aortic stenosis 03/27/2020   Stage 3a chronic kidney disease (HCC) 07/04/2019   Hypnic headache 05/28/2019   Telogen effluvium 04/17/2019   Xerosis cutis 04/17/2019   Essential hypertension 01/14/2019   Cervical radiculopathy 05/04/2018   Osteoarthritis of left glenohumeral joint 05/04/2018   Vitamin B12 deficiency 01/01/2018   Vascular abnormality 09/11/2017   Dyspepsia 03/28/2017   Pulmonary embolism (HCC) 08/16/2016   Dilated pore of [place] 03/23/2015   Retinal drusen of both eyes 08/28/2014   Status post right cataract extraction 07/30/2014   Verruca 03/19/2014   Chronic back pain 11/06/2013   Postmenopausal osteoporosis 06/11/2013   Anticoagulated on Coumadin  04/06/2013   Long term (current) use of anticoagulants 10/18/2012   Preglaucoma 05/10/2012   Presbyopia 05/10/2012   Senile nuclear sclerosis 05/10/2012   Lobular carcinoma in situ of breast 04/13/2012   Closed fracture of  ankle 02/27/2012   History of nonmelanoma skin cancer 09/05/2011   Other benign neoplasm of skin of trunk 09/05/2011   Osteoporosis 08/25/2011   Transient alteration of awareness 09/03/2009   Colon adenoma 07/30/2002    ONSET DATE: 10/05/2024  REFERRING DIAG: Essential HTN; Fall at home; High Risk for Falls  THERAPY DIAG:  Muscle weakness (generalized)  Difficulty in walking, not elsewhere classified  Unsteadiness on feet  Other abnormalities of gait and mobility  Other low back  pain  Other lack of coordination  Rationale for Evaluation and Treatment: Rehabilitation  SUBJECTIVE:                                                                                                                                                                                             SUBJECTIVE STATEMENT: From Today: Patient reports feeling pretty good- states I better be cause today is my birthday and I have to go out for dinner tonight.    From EVAL: Patient reports falling in home stating her knees went out while changing clothes- fell into wall hitting her head.  Reports decreased confidence now in her balance and feels like she is not walking as well. States went for f/u after ED with her PCP and received an order to return to PT.   Pt accompanied by: self  PERTINENT HISTORY: Per hospital ED Note on 10/05/2024: Patient is a 89 year old female present emergency room today after mechanical fall that occurred when she pulled down her underwear use the restroom and then stood up and tripped she struck her right forehead against a wall and crumpled forwards she denies any chest pain or difficulty breathing no abdominal pain states that her only pain is mild headache and some right elbow pain. She also has some neck pain. No back pain however.   PAIN:  Are you having pain? Yes: NPRS scale: 6/10 lower throracic and LBP Pain location: Yes: NPRS scale: 6/10 lower throracic and LBP Pain description: sharp pain Aggravating factors: prolonged standing Relieving factors: Rest, walking, sit and rest   PRECAUTIONS: Fall  RED FLAGS: None   WEIGHT BEARING RESTRICTIONS: No  FALLS: Has patient fallen in last 6 months? Yes. Number of falls 1  LIVING ENVIRONMENT: Lives with: lives alone Lives in: House/apartment Stairs: Yes: External: 1- from garage  steps; none Has following equipment at home: Single point cane, Environmental Consultant - 4 wheeled, Wheelchair (manual), and Tour manager  PLOF:  Independent- Driving, Independent with all household and community mobility with use of device.  PATIENT GOALS: To be able to stand longer and feel secure with walking and less fearful of  falling  OBJECTIVE:  Note: Objective measures were completed at Evaluation unless otherwise noted.  DIAGNOSTIC FINDINGS:  CT of C-spine on 11/8:IMPRESSION: 1. No acute traumatic injury identified in the cervical spine. 2. Cervical spine degeneration with probable mild  spinal stenosis.   Electronically signed by: Helayne Hurst MD 10/05/2024 10:24 AM EST RP  X-ray of pelvis 10/05/2024= IMPRESSION: 1. No acute fracture or dislocation identified about the pelvis.  COGNITION: Overall cognitive status: Within functional limits for tasks assessed   SENSATION: WFL  COORDINATION: Intact heel to shin bilateral  EDEMA:  None observed   POSTURE: rounded shoulders, forward head, increased thoracic kyphosis, and Severe flexed trunk with scoliosis  LOWER EXTREMITY ROM:     Active  Right Eval Left Eval  Hip flexion    Hip extension    Hip abduction    Hip adduction    Hip internal rotation    Hip external rotation    Knee flexion    Knee extension    Ankle dorsiflexion    Ankle plantarflexion    Ankle inversion    Ankle eversion     (Blank rows = not tested)  LOWER EXTREMITY MMT:    MMT Right Eval Left Eval  Hip flexion 4 4  Hip extension 4 4  Hip abduction 4 4  Hip adduction 4 4  Hip internal rotation 3+ 3+  Hip external rotation 3+ 3+  Knee flexion 4 4  Knee extension Lacking 44 deg from zero- 2-/5 4-  Ankle dorsiflexion 4 4  Ankle plantarflexion    Ankle inversion    Ankle eversion    (Blank rows = not tested)  BED MOBILITY:  Not tested  TRANSFERS: Sit to stand: CGA  Assistive device utilized: Environmental Consultant - 4 wheeled     Stand to sit: CGA  Assistive device utilized: Environmental Consultant - 4 wheeled      RAMP:  Not tested  CURB:  Not tested  STAIRS: Not tested GAIT: Findings: Gait  Characteristics: step through pattern, decreased arm swing- Right, decreased arm swing- Left, decreased step length- Right, and decreased step length- Left, Distance walked: approx 180 feet, Assistive device utilized:Walker - 4 wheeled, Level of assistance: CGA, and Comments: severe forward flexed posture due to scoliosis  FUNCTIONAL TESTS:  30 seconds chair stand test= 6 reps with RUE SUpport Timed up and go (TUG): 22.09 sec 6 minute walk test: to be assessed 2nd visit 10 meter walk test: 12.34 sec/ 12.10 sec and fast= 10.75 and 11.1 sec  Berg Balance Scale:  Item Test date: 11/06/2024 Date:  Date:   Sitting to standing 3. able to stand independently using hands Insert SmartPhrase OPRCBERGREEVAL Insert SmartPhrase OPRCBERGREEVAL  2. Standing unsupported 4. able to stand safely for 2 minutes    3. Sitting with back unsupported, feet supported 4. able to sit safely and securely for 2 minutes    4. Standing to sitting 3. controls descent by using hands    5. Pivot transfer  3. able to transfer safely with definite need of hands    6. Standing unsupported with eyes closed 3. able to stand 10 seconds with supervision    7. Standing unsupported with feet together 3. able to place feet together independently and stand 1 minute with supervision    8. Reaching forward with outstretched arms while standing 2. can reach forward 5 cm (2 inches)    9. Pick up object from the floor from standing 0. unable to try/needs assistance to keep from  losing balance or falling    10. Turning to look behind over left and right shoulders while standing 2. turns sideways but only maintains balance    11. Turn 360 degrees 0. needs assistance while turning    12. Place alternate foot on step or stool while standing unsupported 1. able to complete > 2 steps needs minimal assist    13. Standing unsupported one foot in front 0. loses balance while stepping or standing    14. Standing on one leg 0. unable to try of needs  assist to prevent fall      Total Score 28/56 Total Score:    Total Score:      PATIENT SURVEYS:  ABC scale: The Activities-Specific Balance Confidence (ABC) Scale 0% 10 20 30  40 50 60 70 80 90 100% No confidence<->completely confident  How confident are you that you will not lose your balance or become unsteady when you . . .   Date tested 11/06/2024  Walk around the house 60%  2. Walk up or down stairs 30%  3. Bend over and pick up a slipper from in front of a closet floor 0%  4. Reach for a small can off a shelf at eye level 90%  5. Stand on tip toes and reach for something above your head 0%  6. Stand on a chair and reach for something 0%  7. Sweep the floor 90%  8. Walk outside the house to a car parked in the driveway 80%  9. Get into or out of a car 100  10. Walk across a parking lot to the mall 80%  11. Walk up or down a ramp 90%  12. Walk in a crowded mall where people rapidly walk past you 80%  13. Are bumped into by people as you walk through the mall 30%  14. Step onto or off of an escalator while you are holding onto the railing 0%  15. Step onto or off an escalator while holding onto parcels such that you cannot hold onto the railing 0%  16. Walk outside on icy sidewalks 0%  Total: #/16 45.62                                                                                                                                 TREATMENT DATE: 11/06/2024  Therex:  Instruction in some basic LE strengthening for HEP: 2 x10 reps each LE - Hip march -Hip ext -Hip abd  -Knee flex -Minisquat -Calf raises -Toe raises VC to perform slowly   6 Min Walk Test:  Instructed patient to ambulate as quickly and as safely as possible for 6 minutes using LRAD. Patient was allowed to take standing rest breaks without stopping the test, but if the patient required a sitting rest break the clock would be stopped and the test would be over.  Results: 965 feet (294 meters, Avg speed  0.89m/s) using a 5TT  with SBA. Results indicate that the patient has reduced endurance with ambulation compared to age matched norms.  Age Matched Norms: 57-69 yo M: 32 F: 10, 75-79 yo M: 57 F: 471, 8-89 yo M: 417 F: 392 MDC: 58.21 meters (190.98 feet) or 50 meters (ANPTA Core Set of Outcome Measures for Adults with Neurologic Conditions, 2018)   Self care- issued handout today and reviewed all activities.    PATIENT EDUCATION: Education details: purpose of PT; plan of care; Discussed fear of falling and factor it may play in rehab. Person educated: Patient Education method: Explanation Education comprehension: verbalized understanding  HOME EXERCISE PROGRAM: Standing HEP as mentioned above   GOALS: Goals reviewed with patient? Yes  SHORT TERM GOALS: Target date: 12/19/2024  Pt will be independent with HEP in order to improve strength and balance in order to decrease fall risk and improve function at home and work.  Baseline: EVAL- No formal HEP in place Goal status: INITIAL  LONG TERM GOALS: Target date: 01/30/2024  1.  Patient will complete >10 reps of 30 sec SIT TO STAND Test (using min 1 UE support or no Support) indicating an increased LE strength and improved balance. Baseline: EVAL= 6 reps with 1 UE support Goal status: INITIAL  2.  Patient will improve ABC score by 15 percentage points to demonstrate statistically significant improvement in mobility and quality of life as it relates to their confidence in balance.  Baseline: EVAL- 45.62% Goal status: INITIAL   3.  Patient will increase Berg Balance score by > 6 points to demonstrate decreased fall risk during functional activities. Baseline: EVAL= 28/56 Goal status: INITIAL   4.   Patient will reduce timed up and go to <11 seconds to reduce fall risk and demonstrate improved transfer/gait ability. Baseline: EVAL-22.09 sec with 4WW Goal status: INITIAL  5.   Patient will increase 10 meter walk test to >1.49m/s  as to improve gait speed for better community ambulation and to reduce fall risk. Baseline: EVAL=0.82 m/s with 4WW Goal status: INITIAL  6.   Patient will increase six minute walk test distance to >1100 for progression to community ambulator and improve gait ability Baseline: To be assessed visit #2; 11/13/2024 965 feet w 4WW  Goal status: REVISED  ASSESSMENT:  CLINICAL IMPRESSION: Patient is a 89 y.o. female who was seen today for physical therapy treatment for essential HTN and recent fall. Today focused on educating on some LE strengthening that she can perform over the holidays. She responded well overall- requiring VC to perform safely but able to verbalize understanding of handout and return demonstration of all techniques. Her walking speed for 6 min walk was the exact same avg that she previously performed on eval with 10 MWT. Her 6 min walk time is significant lower than it was documented upon her discharge visit from previous episode of care.  Patient will benefit from skilled PT services to assist with muscle strength, functional mobility and balance to improve her functional capabilities and mobility with decreased risk of falling.   OBJECTIVE IMPAIRMENTS: Abnormal gait, decreased activity tolerance, decreased balance, decreased coordination, decreased endurance, decreased mobility, difficulty walking, decreased strength, impaired flexibility, postural dysfunction, and pain.   ACTIVITY LIMITATIONS: carrying, lifting, bending, sitting, standing, squatting, sleeping, stairs, transfers, bed mobility, bathing, dressing, and hygiene/grooming  PARTICIPATION LIMITATIONS: meal prep, cleaning, laundry, shopping, community activity, and yard work  PERSONAL FACTORS: Age and 1 comorbidity: scoliosis and fear of falling are also affecting patient's functional outcome.   REHAB  POTENTIAL: Good  CLINICAL DECISION MAKING: Stable/uncomplicated  EVALUATION COMPLEXITY: Low  PLAN:  PT FREQUENCY:  1-2x/week  PT DURATION: 12 weeks  PLANNED INTERVENTIONS: 97164- PT Re-evaluation, 97750- Physical Performance Testing, 97110-Therapeutic exercises, 97530- Therapeutic activity, [id number]- Neuromuscular re-education, 97535- Self Care, 02859- Manual therapy, [id number]- Gait training, [id number]- Orthotic Initial, [id number]- Orthotic/Prosthetic subsequent, [phone]- Canalith repositioning, [id number]- Electrical stimulation (manual), [phone] (1-2 muscles), 20561 (3+ muscles)- Dry Needling, Patient/Family education, Balance training, Stair training, Taping, Joint mobilization, Vestibular training, DME instructions, Cryotherapy, and Moist heat  PLAN FOR NEXT SESSION:   Initiate balance training in clinic - static and dynamic and add to HEP as appropriate.   Reyes LOISE London, PT 11/14/2024, 11:35 AM

## 2024-11-25 ENCOUNTER — Ambulatory Visit (INDEPENDENT_AMBULATORY_CARE_PROVIDER_SITE_OTHER): Payer: Self-pay | Admitting: Physician Assistant

## 2024-11-25 ENCOUNTER — Encounter: Payer: Self-pay | Admitting: Physician Assistant

## 2024-11-25 VITALS — BP 129/73 | HR 78 | Ht 60.0 in | Wt 87.0 lb

## 2024-11-25 DIAGNOSIS — N3281 Overactive bladder: Secondary | ICD-10-CM

## 2024-11-25 DIAGNOSIS — R3989 Other symptoms and signs involving the genitourinary system: Secondary | ICD-10-CM

## 2024-11-25 LAB — URINALYSIS, COMPLETE
Bilirubin, UA: NEGATIVE
Glucose, UA: NEGATIVE
Nitrite, UA: NEGATIVE
Protein,UA: NEGATIVE
RBC, UA: NEGATIVE
Specific Gravity, UA: 1.025 (ref 1.005–1.030)
Urobilinogen, Ur: 0.2 mg/dL (ref 0.2–1.0)
pH, UA: 6 (ref 5.0–7.5)

## 2024-11-25 LAB — MICROSCOPIC EXAMINATION

## 2024-11-25 MED ORDER — CEPHALEXIN 500 MG PO CAPS: 500.0000 mg | ORAL_CAPSULE | Freq: Two times a day (BID) | ORAL | 0 refills | Status: AC

## 2024-11-25 NOTE — Patient Instructions (Signed)

## 2024-11-25 NOTE — Progress Notes (Signed)
 "  11/25/2024 3:00 PM   Kristina Rowe [birth year] [id number]  CC: Chief Complaint  Patient presents with   Follow-up   HPI: Kristina Rowe is a 89 y.o. female with PMH OAB wet, recurrent UTI on topical vaginal estrogen cream and previously on Keflex  125 mg daily, chronic nausea, microscopic hematuria, and chronic PE on Xarelto  who presents today for annual follow-up.   Today she reports constant urinary incontinence, especially overnight.  She wears overnight depends, and they can be soaked.  She also describes about a week of possibly increased urgency over baseline.  No pacemaker or defibrillator.  Notably, we have struggled with medications with her in the past due to her nausea.  In-office UA today positive for trace ketones and 1+ leukocytes; urine microscopy with 11-30 WBCs/HPF and many bacteria.  PMH: Past Medical History:  Diagnosis Date   Ductal carcinoma in situ (DCIS) of left breast    Dysphagia    Esophageal stricture    Gastroparesis    GERD (gastroesophageal reflux disease)    Hiatal hernia    Hypertension    Iron deficiency anemia    Osteoporosis    Pulmonary embolism (HCC)    Scoliosis    UTI (urinary tract infection)     Surgical History: Past Surgical History:  Procedure Laterality Date   BREAST LUMPECTOMY Left 2009   Ductal Carcinoma insitu    CATARACT EXTRACTION, BILATERAL     COLONOSCOPY N/A 03/17/2021   Procedure: COLONOSCOPY;  Surgeon: Maryruth Ole DASEN, MD;  Location: [facility];  Service: Endoscopy;  Laterality: N/A;   COLOSTOMY REVISION Right 03/18/2021   Procedure: COLON RESECTION RIGHT;  Surgeon: Desiderio Schanz, MD;  Location: [facility];  Service: General;  Laterality: Right;   LAPAROSCOPIC PARAESOPHAGEAL HERNIA REPAIR  2009   LAPAROTOMY N/A 03/10/2021   Procedure: EXPLORATORY LAPAROTOMY;  Surgeon: Desiderio Schanz, MD;  Location: [facility];  Service: General;  Laterality: N/A;   TUBAL LIGATION      Home Medications:  Allergies as of  11/25/2024       Reactions   Metronidazole  Other (See Comments)   Other reaction(s): Other Mouth sores Mouth sores Mouth sores Other reaction(s): Other (See Comments) Mouth sores Other reaction(s): Other Mouth sores   Omeprazole Other (See Comments)   Other reaction(s): Arthralgias (intolerance) Other reaction(s): Other Other reaction(s): Arthralgias (intolerance), Other (See Comments) Other reaction(s): Arthralgias (intolerance) Other reaction(s): Other   Bactrim  [sulfamethoxazole -trimethoprim ] Nausea Only   Codeine Nausea And Vomiting, Nausea Only   Other reaction(s): Nausea Only But tolerates tramadol But tolerates tramadol Other reaction(s): Nausea Only But tolerates tramadol   Gemtesa  [vibegron ] Nausea Only   Propoxyphene Other (See Comments)   But tolerates tramadol and tylenol    Doxycycline Nausea Only        Medication List        Accurate as of November 25, 2024  3:00 PM. If you have any questions, ask your nurse or doctor.          STOP taking these medications    folic acid  1 MG tablet Commonly known as: FOLVITE  Stopped by: Lucie Hones       TAKE these medications    acetaminophen  500 MG tablet Commonly known as: TYLENOL  Take 2 tablets (1,000 mg total) by mouth every 6 (six) hours as needed.   albuterol  108 (90 Base) MCG/ACT inhaler Commonly known as: VENTOLIN  HFA Inhale into the lungs.   amLODipine  2.5 MG tablet Commonly known as: NORVASC  Take by mouth.  Cholecalciferol  50 MCG (2000 UT) Caps Take by mouth.   estradiol  0.1 MG/GM vaginal cream Commonly known as: ESTRACE  Estrogen Cream Instruction Discard applicator Apply pea sized amount to tip of finger to urethra before bed. Wash hands well after application. Use Monday, Wednesday and Friday   famotidine  40 MG tablet Commonly known as: PEPCID  Take 40 mg by mouth 2 (two) times daily.   furosemide  20 MG tablet Commonly known as: Lasix  Take 1 tablet (20 mg total) by  mouth daily as needed for fluid or edema.   lidocaine  5 % Commonly known as: Lidoderm  Place 1 patch onto the skin daily. Remove & Discard patch within 12 hours or as directed by MD   ondansetron  4 MG disintegrating tablet Commonly known as: ZOFRAN -ODT Take 1 tablet (4 mg total) by mouth every 8 (eight) hours as needed.   polyethylene glycol 17 g packet Commonly known as: MIRALAX  / GLYCOLAX  Take 17 g by mouth daily.   rivaroxaban  10 MG Tabs tablet Commonly known as: XARELTO  Take 10 mg by mouth daily.   traMADol 50 MG tablet Commonly known as: ULTRAM Take 50 mg by mouth 3 (three) times daily as needed.        Allergies:  Allergies[1]  Family History: Family History  Problem Relation Age of Onset   Colon cancer Mother    Emphysema Father    Cholelithiasis Sister    Factor V Leiden deficiency Sister    Thyroid  disease Sister    Cholelithiasis Sister    Pulmonary embolism Sister    Deep vein thrombosis Brother    Liver cancer Brother    Stroke Brother        died age 37 June 2022, [employer]-Day veteran   Cancer Brother        oral cancer   Breast cancer Maternal Uncle     Social History:   reports that she has never smoked. She has never been exposed to tobacco smoke. She has never used smokeless tobacco. She reports that she does not drink alcohol and does not use drugs.  Physical Exam: BP 129/73   Pulse 78   Ht 5' (1.524 m)   Wt 87 lb (39.5 kg)   BMI 16.99 kg/m   Constitutional:  Alert and oriented, no acute distress, nontoxic appearing HEENT: [place], AT Cardiovascular: No clubbing, cyanosis, or edema Respiratory: Normal respiratory effort, no increased work of breathing Skin: No rashes, bruises or suspicious lesions Neurologic: Grossly intact, no focal deficits, moving all 4 extremities Psychiatric: Normal mood and affect  Laboratory Data: See Epic  Assessment & Plan:   1. Suspected UTI (Primary) UA appears grossly positive, will send for culture.  She prefers  to start empiric therapy now, will send in Keflex . - Urinalysis, Complete - CULTURE, URINE COMPREHENSIVE - cephALEXin  (KEFLEX ) 500 MG capsule; Take 1 capsule (500 mg total) by mouth 2 (two) times daily for 5 days.  Dispense: 10 capsule; Refill: 0  2. OAB (overactive bladder) Increased urinary incontinence off pharmacotherapy.  I recommended PTNS due to poor medication tolerance in the past and she agreed.  Will call her to get this scheduled.  Return for Will call to schedule PTNS.  Lucie Hones, PA-C  [facility] [address], [address] [place], [place] [address] [phone]      [1]  Allergies Allergen Reactions   Metronidazole  Other (See Comments)    Other reaction(s): Other Mouth sores Mouth sores Mouth sores  Other reaction(s): Other (See Comments) Mouth  sores Other reaction(s): Other  Mouth sores    Omeprazole Other (See Comments)    Other reaction(s): Arthralgias (intolerance) Other reaction(s): Other  Other reaction(s): Arthralgias (intolerance), Other (See Comments) Other reaction(s): Arthralgias (intolerance)  Other reaction(s): Other    Bactrim  [Sulfamethoxazole -Trimethoprim ] Nausea Only   Codeine Nausea And Vomiting and Nausea Only    Other reaction(s): Nausea Only But tolerates tramadol  But tolerates tramadol Other reaction(s): Nausea Only  But tolerates tramadol    Gemtesa  [Vibegron ] Nausea Only   Propoxyphene Other (See Comments)    But tolerates tramadol and tylenol    Doxycycline Nausea Only   "

## 2024-11-27 ENCOUNTER — Ambulatory Visit

## 2024-11-27 ENCOUNTER — Telehealth: Payer: Self-pay

## 2024-11-27 DIAGNOSIS — R2689 Other abnormalities of gait and mobility: Secondary | ICD-10-CM

## 2024-11-27 DIAGNOSIS — R2681 Unsteadiness on feet: Secondary | ICD-10-CM

## 2024-11-27 DIAGNOSIS — R262 Difficulty in walking, not elsewhere classified: Secondary | ICD-10-CM

## 2024-11-27 DIAGNOSIS — M6281 Muscle weakness (generalized): Secondary | ICD-10-CM

## 2024-11-27 DIAGNOSIS — M5459 Other low back pain: Secondary | ICD-10-CM

## 2024-11-27 DIAGNOSIS — R278 Other lack of coordination: Secondary | ICD-10-CM

## 2024-11-27 NOTE — Telephone Encounter (Signed)
 No PA needed for PTNS

## 2024-11-27 NOTE — Therapy (Signed)
 " OUTPATIENT PHYSICAL THERAPY NEURO TREATMENT   Patient Name: Kristina Rowe MRN: [id number] DOB:Apr 13, 1930, 89 y.o., female Today's Date: 11/27/2024   PCP: Dr. Lavenia Beaver  REFERRING PROVIDER: Dr. Lavenia Beaver  END OF SESSION:  PT End of Session - 11/27/24 1414     Visit Number 3    Number of Visits 24    Date for Recertification  01/29/25    Progress Note Due on Visit 10    PT Start Time 1408    PT Stop Time 1447    PT Time Calculation (min) 39 min    Equipment Utilized During Treatment Gait belt    Activity Tolerance Patient tolerated treatment well;No increased pain    Behavior During Therapy WFL for tasks assessed/performed           Past Medical History:  Diagnosis Date   Ductal carcinoma in situ (DCIS) of left breast    Dysphagia    Esophageal stricture    Gastroparesis    GERD (gastroesophageal reflux disease)    Hiatal hernia    Hypertension    Iron deficiency anemia    Osteoporosis    Pulmonary embolism (HCC)    Scoliosis    UTI (urinary tract infection)    Past Surgical History:  Procedure Laterality Date   BREAST LUMPECTOMY Left 2009   Ductal Carcinoma insitu    CATARACT EXTRACTION, BILATERAL     COLONOSCOPY N/A 03/17/2021   Procedure: COLONOSCOPY;  Surgeon: Maryruth Ole DASEN, MD;  Location: [facility];  Service: Endoscopy;  Laterality: N/A;   COLOSTOMY REVISION Right 03/18/2021   Procedure: COLON RESECTION RIGHT;  Surgeon: Desiderio Schanz, MD;  Location: [facility];  Service: General;  Laterality: Right;   LAPAROSCOPIC PARAESOPHAGEAL HERNIA REPAIR  2009   LAPAROTOMY N/A 03/10/2021   Procedure: EXPLORATORY LAPAROTOMY;  Surgeon: Desiderio Schanz, MD;  Location: [facility];  Service: General;  Laterality: N/A;   TUBAL LIGATION     Patient Active Problem List   Diagnosis Date Noted   Acute on chronic diastolic CHF (congestive heart failure) (HCC) 08/19/2023   Acute respiratory failure with hypoxia (HCC) 08/17/2023   History of pulmonary  embolus (PE) 08/17/2023   Basal cell carcinoma of leg 04/01/2021   Coronary atherosclerosis 04/01/2021   History of pulmonary embolism 04/01/2021   Pulmonary nodule 04/01/2021   Malignant neoplasm of colon (HCC) 03/29/2021   Rectal bleeding 03/15/2021   Hypertension    GERD (gastroesophageal reflux disease)    Colonic mass    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HCC) 03/10/2021   Anemia, unspecified 03/06/2021   Bronchiectasis without complication (HCC) 03/27/2020   Iron deficiency anemia 03/27/2020   Moderate aortic stenosis 03/27/2020   Stage 3a chronic kidney disease (HCC) 07/04/2019   Hypnic headache 05/28/2019   Telogen effluvium 04/17/2019   Xerosis cutis 04/17/2019   Essential hypertension 01/14/2019   Cervical radiculopathy 05/04/2018   Osteoarthritis of left glenohumeral joint 05/04/2018   Vitamin B12 deficiency 01/01/2018   Vascular abnormality 09/11/2017   Dyspepsia 03/28/2017   Pulmonary embolism (HCC) 08/16/2016   Dilated pore of [place] 03/23/2015   Retinal drusen of both eyes 08/28/2014   Status post right cataract extraction 07/30/2014   Verruca 03/19/2014   Chronic back pain 11/06/2013   Postmenopausal osteoporosis 06/11/2013   Anticoagulated on Coumadin  04/06/2013   Long term (current) use of anticoagulants 10/18/2012   Preglaucoma 05/10/2012   Presbyopia 05/10/2012   Senile nuclear sclerosis 05/10/2012   Lobular carcinoma in situ of breast 04/13/2012   Closed  fracture of ankle 02/27/2012   History of nonmelanoma skin cancer 09/05/2011   Other benign neoplasm of skin of trunk 09/05/2011   Osteoporosis 08/25/2011   Transient alteration of awareness 09/03/2009   Colon adenoma 07/30/2002    ONSET DATE: 10/05/2024  REFERRING DIAG: Essential HTN; Fall at home; High Risk for Falls  THERAPY DIAG:  Muscle weakness (generalized)  Difficulty in walking, not elsewhere classified  Unsteadiness on feet  Other abnormalities of gait and mobility  Other low back  pain  Other lack of coordination  Rationale for Evaluation and Treatment: Rehabilitation  SUBJECTIVE:                                                                                                                                                                                             SUBJECTIVE STATEMENT: From Today: I am ready to get back to some normalcy after Christmas Holiday.     From EVAL: Patient reports falling in home stating her knees went out while changing clothes- fell into wall hitting her head.  Reports decreased confidence now in her balance and feels like she is not walking as well. States went for f/u after ED with her PCP and received an order to return to PT.   Pt accompanied by: self  PERTINENT HISTORY: Per hospital ED Note on 10/05/2024: Patient is a 89 year old female present emergency room today after mechanical fall that occurred when she pulled down her underwear use the restroom and then stood up and tripped she struck her right forehead against a wall and crumpled forwards she denies any chest pain or difficulty breathing no abdominal pain states that her only pain is mild headache and some right elbow pain. She also has some neck pain. No back pain however.   PAIN:  Are you having pain? Yes: NPRS scale: 6/10 lower throracic and LBP Pain location: Yes: NPRS scale: 6/10 lower throracic and LBP Pain description: sharp pain Aggravating factors: prolonged standing Relieving factors: Rest, walking, sit and rest   PRECAUTIONS: Fall  RED FLAGS: None   WEIGHT BEARING RESTRICTIONS: No  FALLS: Has patient fallen in last 6 months? Yes. Number of falls 1  LIVING ENVIRONMENT: Lives with: lives alone Lives in: House/apartment Stairs: Yes: External: 1- from garage  steps; none Has following equipment at home: Single point cane, Environmental Consultant - 4 wheeled, Wheelchair (manual), and Tour manager  PLOF: Independent- Driving, Independent with all household and community  mobility with use of device.  PATIENT GOALS: To be able to stand longer and feel secure with walking and less fearful of falling  OBJECTIVE:  Note: Objective measures were  completed at Evaluation unless otherwise noted.  DIAGNOSTIC FINDINGS:  CT of C-spine on 11/8:IMPRESSION: 1. No acute traumatic injury identified in the cervical spine. 2. Cervical spine degeneration with probable mild  spinal stenosis.   Electronically signed by: Helayne Hurst MD 10/05/2024 10:24 AM EST RP  X-ray of pelvis 10/05/2024= IMPRESSION: 1. No acute fracture or dislocation identified about the pelvis.  COGNITION: Overall cognitive status: Within functional limits for tasks assessed   SENSATION: WFL  COORDINATION: Intact heel to shin bilateral  EDEMA:  None observed   POSTURE: rounded shoulders, forward head, increased thoracic kyphosis, and Severe flexed trunk with scoliosis  LOWER EXTREMITY ROM:     Active  Right Eval Left Eval  Hip flexion    Hip extension    Hip abduction    Hip adduction    Hip internal rotation    Hip external rotation    Knee flexion    Knee extension    Ankle dorsiflexion    Ankle plantarflexion    Ankle inversion    Ankle eversion     (Blank rows = not tested)  LOWER EXTREMITY MMT:    MMT Right Eval Left Eval  Hip flexion 4 4  Hip extension 4 4  Hip abduction 4 4  Hip adduction 4 4  Hip internal rotation 3+ 3+  Hip external rotation 3+ 3+  Knee flexion 4 4  Knee extension Lacking 44 deg from zero- 2-/5 4-  Ankle dorsiflexion 4 4  Ankle plantarflexion    Ankle inversion    Ankle eversion    (Blank rows = not tested)  BED MOBILITY:  Not tested  TRANSFERS: Sit to stand: CGA  Assistive device utilized: Environmental Consultant - 4 wheeled     Stand to sit: CGA  Assistive device utilized: Environmental Consultant - 4 wheeled      RAMP:  Not tested  CURB:  Not tested  STAIRS: Not tested GAIT: Findings: Gait Characteristics: step through pattern, decreased arm swing- Right,  decreased arm swing- Left, decreased step length- Right, and decreased step length- Left, Distance walked: approx 180 feet, Assistive device utilized:Walker - 4 wheeled, Level of assistance: CGA, and Comments: severe forward flexed posture due to scoliosis  FUNCTIONAL TESTS:  30 seconds chair stand test= 6 reps with RUE SUpport Timed up and go (TUG): 22.09 sec 6 minute walk test: to be assessed 2nd visit 10 meter walk test: 12.34 sec/ 12.10 sec and fast= 10.75 and 11.1 sec  Berg Balance Scale:  Item Test date: 11/06/2024 Date:  Date:   Sitting to standing 3. able to stand independently using hands Insert SmartPhrase OPRCBERGREEVAL Insert SmartPhrase OPRCBERGREEVAL  2. Standing unsupported 4. able to stand safely for 2 minutes    3. Sitting with back unsupported, feet supported 4. able to sit safely and securely for 2 minutes    4. Standing to sitting 3. controls descent by using hands    5. Pivot transfer  3. able to transfer safely with definite need of hands    6. Standing unsupported with eyes closed 3. able to stand 10 seconds with supervision    7. Standing unsupported with feet together 3. able to place feet together independently and stand 1 minute with supervision    8. Reaching forward with outstretched arms while standing 2. can reach forward 5 cm (2 inches)    9. Pick up object from the floor from standing 0. unable to try/needs assistance to keep from losing balance or falling    10.  Turning to look behind over left and right shoulders while standing 2. turns sideways but only maintains balance    11. Turn 360 degrees 0. needs assistance while turning    12. Place alternate foot on step or stool while standing unsupported 1. able to complete > 2 steps needs minimal assist    13. Standing unsupported one foot in front 0. loses balance while stepping or standing    14. Standing on one leg 0. unable to try of needs assist to prevent fall      Total Score 28/56 Total Score:    Total  Score:      PATIENT SURVEYS:  ABC scale: The Activities-Specific Balance Confidence (ABC) Scale 0% 10 20 30  40 50 60 70 80 90 100% No confidence<->completely confident  How confident are you that you will not lose your balance or become unsteady when you . . .   Date tested 11/06/2024  Walk around the house 60%  2. Walk up or down stairs 30%  3. Bend over and pick up a slipper from in front of a closet floor 0%  4. Reach for a small can off a shelf at eye level 90%  5. Stand on tip toes and reach for something above your head 0%  6. Stand on a chair and reach for something 0%  7. Sweep the floor 90%  8. Walk outside the house to a car parked in the driveway 80%  9. Get into or out of a car 100  10. Walk across a parking lot to the mall 80%  11. Walk up or down a ramp 90%  12. Walk in a crowded mall where people rapidly walk past you 80%  13. Are bumped into by people as you walk through the mall 30%  14. Step onto or off of an escalator while you are holding onto the railing 0%  15. Step onto or off an escalator while holding onto parcels such that you cannot hold onto the railing 0%  16. Walk outside on icy sidewalks 0%  Total: #/16 45.62    6 Min Walk Test:  Instructed patient to ambulate as quickly and as safely as possible for 6 minutes using LRAD. Patient was allowed to take standing rest breaks without stopping the test, but if the patient required a sitting rest break the clock would be stopped and the test would be over.  Results: 965 feet (294 meters, Avg speed 0.43m/s) using a 4WW  with SBA. Results indicate that the patient has reduced endurance with ambulation compared to age matched norms.  Age Matched Norms: 59-69 yo M: 56 F: 6, 44-79 yo M: 77 F: 471, 45-89 yo M: 417 F: 392 MDC: 58.21 meters (190.98 feet) or 50 meters (ANPTA Core Set of Outcome Measures for Adults with Neurologic Conditions, 2018)  TREATMENT DATE: 11/27/2024   NMR: Postural awareness -Scap retraction (RTB) 2 x 12 reps -Shoulder Horizontal ABD w resistance (RTB) 2 x 12 -Shoulder Ext BUE (RTB) 2 x 12 -Shoulder ER BUE (RTB) 2 x 12 *VC for cadence and posture. Patient was fatigued but able to complete.   TA: Walking in clinic using 4WW x 350 feet  Step up x 15 reps each LE Lateral step up x 15 reps each LE        Self care-   PATIENT EDUCATION: Education details: purpose of PT; plan of care; Discussed fear of falling and factor it may play in rehab. Person educated: Patient Education method: Explanation Education comprehension: verbalized understanding  HOME EXERCISE PROGRAM: Standing HEP as mentioned above   GOALS: Goals reviewed with patient? Yes  SHORT TERM GOALS: Target date: 12/19/2024  Pt will be independent with HEP in order to improve strength and balance in order to decrease fall risk and improve function at home and work.  Baseline: EVAL- No formal HEP in place Goal status: INITIAL  LONG TERM GOALS: Target date: 01/30/2024  1.  Patient will complete >10 reps of 30 sec SIT TO STAND Test (using min 1 UE support or no Support) indicating an increased LE strength and improved balance. Baseline: EVAL= 6 reps with 1 UE support Goal status: INITIAL  2.  Patient will improve ABC score by 15 percentage points to demonstrate statistically significant improvement in mobility and quality of life as it relates to their confidence in balance.  Baseline: EVAL- 45.62% Goal status: INITIAL   3.  Patient will increase Berg Balance score by > 6 points to demonstrate decreased fall risk during functional activities. Baseline: EVAL= 28/56 Goal status: INITIAL   4.   Patient will reduce timed up and go to <11 seconds to reduce fall risk and demonstrate improved transfer/gait ability. Baseline: EVAL-22.09 sec with 4WW Goal status: INITIAL  5.    Patient will increase 10 meter walk test to >1.58m/s as to improve gait speed for better community ambulation and to reduce fall risk. Baseline: EVAL=0.82 m/s with 4WW Goal status: INITIAL  6.   Patient will increase six minute walk test distance to >1100 for progression to community ambulator and improve gait ability Baseline: To be assessed visit #2; 11/13/2024 965 feet w 4WW  Goal status: REVISED  ASSESSMENT:  CLINICAL IMPRESSION: Patient is a 89 y.o. female who was seen today for physical therapy treatment for essential HTN and recent fall. Patient presents with recent visit to MD with discussion of recurrent UTI and treatment was limited to not over-exert her. She was fatigued with activities yet completed all well today.  Patient will benefit from skilled PT services to assist with muscle strength, functional mobility and balance to improve her functional capabilities and mobility with decreased risk of falling.   OBJECTIVE IMPAIRMENTS: Abnormal gait, decreased activity tolerance, decreased balance, decreased coordination, decreased endurance, decreased mobility, difficulty walking, decreased strength, impaired flexibility, postural dysfunction, and pain.   ACTIVITY LIMITATIONS: carrying, lifting, bending, sitting, standing, squatting, sleeping, stairs, transfers, bed mobility, bathing, dressing, and hygiene/grooming  PARTICIPATION LIMITATIONS: meal prep, cleaning, laundry, shopping, community activity, and yard work  PERSONAL FACTORS: Age and 1 comorbidity: scoliosis and fear of falling are also affecting patient's functional outcome.   REHAB POTENTIAL: Good  CLINICAL DECISION MAKING: Stable/uncomplicated  EVALUATION COMPLEXITY: Low  PLAN:  PT FREQUENCY: 1-2x/week  PT DURATION: 12 weeks  PLANNED INTERVENTIONS: 97164- PT Re-evaluation, 97750- Physical Performance Testing, 97110-Therapeutic exercises,  02469- Therapeutic activity, [id number]- Neuromuscular re-education, [id number]- Self  Care, [id number]- Manual therapy, [id number]- Gait training, [id number]- Orthotic Initial, [id number]- Orthotic/Prosthetic subsequent, [phone]- Canalith repositioning, [id number]- Electrical stimulation (manual), [phone] (1-2 muscles), [id number] (3+ muscles)- Dry Needling, Patient/Family education, Balance training, Stair training, Taping, Joint mobilization, Vestibular training, DME instructions, Cryotherapy, and Moist heat  PLAN FOR NEXT SESSION:   Initiate balance training in clinic - static and dynamic and add to HEP as appropriate.   Reyes LOISE London, PT 11/27/2024, 2:47 PM        "

## 2024-11-27 NOTE — Telephone Encounter (Signed)
 Called patient on 11/27/2024 at 4:28pm to get scheduled for PTNS no answer. Left message to call back.

## 2024-11-27 NOTE — Telephone Encounter (Signed)
 Patient has Medicare part A and B

## 2024-12-01 LAB — CULTURE, URINE COMPREHENSIVE

## 2024-12-02 ENCOUNTER — Ambulatory Visit: Attending: Internal Medicine

## 2024-12-02 ENCOUNTER — Ambulatory Visit: Payer: Self-pay | Admitting: Physician Assistant

## 2024-12-02 DIAGNOSIS — R2681 Unsteadiness on feet: Secondary | ICD-10-CM | POA: Diagnosis present

## 2024-12-02 DIAGNOSIS — R2689 Other abnormalities of gait and mobility: Secondary | ICD-10-CM | POA: Diagnosis present

## 2024-12-02 DIAGNOSIS — M6281 Muscle weakness (generalized): Secondary | ICD-10-CM | POA: Insufficient documentation

## 2024-12-02 DIAGNOSIS — R278 Other lack of coordination: Secondary | ICD-10-CM | POA: Insufficient documentation

## 2024-12-02 DIAGNOSIS — R262 Difficulty in walking, not elsewhere classified: Secondary | ICD-10-CM | POA: Diagnosis present

## 2024-12-02 DIAGNOSIS — M5459 Other low back pain: Secondary | ICD-10-CM | POA: Diagnosis present

## 2024-12-02 NOTE — Therapy (Signed)
 " OUTPATIENT PHYSICAL THERAPY NEURO TREATMENT   Patient Name: Kristina Rowe MRN: [id number] DOB:05/22/1930, 89 y.o., female Today's Date: 12/02/2024   PCP: Dr. Lavenia Beaver  REFERRING PROVIDER: Dr. Lavenia Beaver  END OF SESSION:  PT End of Session - 12/02/24 1539     Visit Number 4    Number of Visits 24    Date for Recertification  01/29/25    Progress Note Due on Visit 10    PT Start Time 1532    PT Stop Time 1614    PT Time Calculation (min) 42 min    Equipment Utilized During Treatment Gait belt    Activity Tolerance Patient tolerated treatment well;No increased pain    Behavior During Therapy WFL for tasks assessed/performed           Past Medical History:  Diagnosis Date   Ductal carcinoma in situ (DCIS) of left breast    Dysphagia    Esophageal stricture    Gastroparesis    GERD (gastroesophageal reflux disease)    Hiatal hernia    Hypertension    Iron deficiency anemia    Osteoporosis    Pulmonary embolism (HCC)    Scoliosis    UTI (urinary tract infection)    Past Surgical History:  Procedure Laterality Date   BREAST LUMPECTOMY Left 2009   Ductal Carcinoma insitu    CATARACT EXTRACTION, BILATERAL     COLONOSCOPY N/A 03/17/2021   Procedure: COLONOSCOPY;  Surgeon: Maryruth Ole DASEN, MD;  Location: [facility];  Service: Endoscopy;  Laterality: N/A;   COLOSTOMY REVISION Right 03/18/2021   Procedure: COLON RESECTION RIGHT;  Surgeon: Desiderio Schanz, MD;  Location: [facility];  Service: General;  Laterality: Right;   LAPAROSCOPIC PARAESOPHAGEAL HERNIA REPAIR  2009   LAPAROTOMY N/A 03/10/2021   Procedure: EXPLORATORY LAPAROTOMY;  Surgeon: Desiderio Schanz, MD;  Location: [facility];  Service: General;  Laterality: N/A;   TUBAL LIGATION     Patient Active Problem List   Diagnosis Date Noted   Acute on chronic diastolic CHF (congestive heart failure) (HCC) 08/19/2023   Acute respiratory failure with hypoxia (HCC) 08/17/2023   History of pulmonary  embolus (PE) 08/17/2023   Basal cell carcinoma of leg 04/01/2021   Coronary atherosclerosis 04/01/2021   History of pulmonary embolism 04/01/2021   Pulmonary nodule 04/01/2021   Malignant neoplasm of colon (HCC) 03/29/2021   Rectal bleeding 03/15/2021   Hypertension    GERD (gastroesophageal reflux disease)    Colonic mass    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HCC) 03/10/2021   Anemia, unspecified 03/06/2021   Bronchiectasis without complication (HCC) 03/27/2020   Iron deficiency anemia 03/27/2020   Moderate aortic stenosis 03/27/2020   Stage 3a chronic kidney disease (HCC) 07/04/2019   Hypnic headache 05/28/2019   Telogen effluvium 04/17/2019   Xerosis cutis 04/17/2019   Essential hypertension 01/14/2019   Cervical radiculopathy 05/04/2018   Osteoarthritis of left glenohumeral joint 05/04/2018   Vitamin B12 deficiency 01/01/2018   Vascular abnormality 09/11/2017   Dyspepsia 03/28/2017   Pulmonary embolism (HCC) 08/16/2016   Dilated pore of [place] 03/23/2015   Retinal drusen of both eyes 08/28/2014   Status post right cataract extraction 07/30/2014   Verruca 03/19/2014   Chronic back pain 11/06/2013   Postmenopausal osteoporosis 06/11/2013   Anticoagulated on Coumadin  04/06/2013   Long term (current) use of anticoagulants 10/18/2012   Preglaucoma 05/10/2012   Presbyopia 05/10/2012   Senile nuclear sclerosis 05/10/2012   Lobular carcinoma in situ of breast 04/13/2012   Closed  fracture of ankle 02/27/2012   History of nonmelanoma skin cancer 09/05/2011   Other benign neoplasm of skin of trunk 09/05/2011   Osteoporosis 08/25/2011   Transient alteration of awareness 09/03/2009   Colon adenoma 07/30/2002    ONSET DATE: 10/05/2024  REFERRING DIAG: Essential HTN; Fall at home; High Risk for Falls  THERAPY DIAG:  Muscle weakness (generalized)  Difficulty in walking, not elsewhere classified  Unsteadiness on feet  Other abnormalities of gait and mobility  Other low back  pain  Other lack of coordination  Rationale for Evaluation and Treatment: Rehabilitation  SUBJECTIVE:                                                                                                                                                                                             SUBJECTIVE STATEMENT: From Today: Doing okay today- just tired.     From EVAL: Patient reports falling in home stating her knees went out while changing clothes- fell into wall hitting her head.  Reports decreased confidence now in her balance and feels like she is not walking as well. States went for f/u after ED with her PCP and received an order to return to PT.   Pt accompanied by: self  PERTINENT HISTORY: Per hospital ED Note on 10/05/2024: Patient is a 89 year old female present emergency room today after mechanical fall that occurred when she pulled down her underwear use the restroom and then stood up and tripped she struck her right forehead against a wall and crumpled forwards she denies any chest pain or difficulty breathing no abdominal pain states that her only pain is mild headache and some right elbow pain. She also has some neck pain. No back pain however.   PAIN:  Are you having pain? Yes: NPRS scale: 6/10 lower throracic and LBP Pain location: Yes: NPRS scale: 6/10 lower throracic and LBP Pain description: sharp pain Aggravating factors: prolonged standing Relieving factors: Rest, walking, sit and rest   PRECAUTIONS: Fall  RED FLAGS: None   WEIGHT BEARING RESTRICTIONS: No  FALLS: Has patient fallen in last 6 months? Yes. Number of falls 1  LIVING ENVIRONMENT: Lives with: lives alone Lives in: House/apartment Stairs: Yes: External: 1- from garage  steps; none Has following equipment at home: Single point cane, Environmental Consultant - 4 wheeled, Wheelchair (manual), and Tour manager  PLOF: Independent- Driving, Independent with all household and community mobility with use of  device.  PATIENT GOALS: To be able to stand longer and feel secure with walking and less fearful of falling  OBJECTIVE:  Note: Objective measures were completed at Evaluation unless otherwise noted.  DIAGNOSTIC FINDINGS:  CT of C-spine on 11/8:IMPRESSION: 1. No acute traumatic injury identified in the cervical spine. 2. Cervical spine degeneration with probable mild  spinal stenosis.   Electronically signed by: Helayne Hurst MD 10/05/2024 10:24 AM EST RP  X-ray of pelvis 10/05/2024= IMPRESSION: 1. No acute fracture or dislocation identified about the pelvis.  COGNITION: Overall cognitive status: Within functional limits for tasks assessed   SENSATION: WFL  COORDINATION: Intact heel to shin bilateral  EDEMA:  None observed   POSTURE: rounded shoulders, forward head, increased thoracic kyphosis, and Severe flexed trunk with scoliosis  LOWER EXTREMITY ROM:     Active  Right Eval Left Eval  Hip flexion    Hip extension    Hip abduction    Hip adduction    Hip internal rotation    Hip external rotation    Knee flexion    Knee extension    Ankle dorsiflexion    Ankle plantarflexion    Ankle inversion    Ankle eversion     (Blank rows = not tested)  LOWER EXTREMITY MMT:    MMT Right Eval Left Eval  Hip flexion 4 4  Hip extension 4 4  Hip abduction 4 4  Hip adduction 4 4  Hip internal rotation 3+ 3+  Hip external rotation 3+ 3+  Knee flexion 4 4  Knee extension Lacking 44 deg from zero- 2-/5 4-  Ankle dorsiflexion 4 4  Ankle plantarflexion    Ankle inversion    Ankle eversion    (Blank rows = not tested)  BED MOBILITY:  Not tested  TRANSFERS: Sit to stand: CGA  Assistive device utilized: Environmental Consultant - 4 wheeled     Stand to sit: CGA  Assistive device utilized: Environmental Consultant - 4 wheeled      RAMP:  Not tested  CURB:  Not tested  STAIRS: Not tested GAIT: Findings: Gait Characteristics: step through pattern, decreased arm swing- Right, decreased arm swing-  Left, decreased step length- Right, and decreased step length- Left, Distance walked: approx 180 feet, Assistive device utilized:Walker - 4 wheeled, Level of assistance: CGA, and Comments: severe forward flexed posture due to scoliosis  FUNCTIONAL TESTS:  30 seconds chair stand test= 6 reps with RUE SUpport Timed up and go (TUG): 22.09 sec 6 minute walk test: to be assessed 2nd visit 10 meter walk test: 12.34 sec/ 12.10 sec and fast= 10.75 and 11.1 sec  Berg Balance Scale:  Item Test date: 11/06/2024 Date:  Date:   Sitting to standing 3. able to stand independently using hands Insert SmartPhrase OPRCBERGREEVAL Insert SmartPhrase OPRCBERGREEVAL  2. Standing unsupported 4. able to stand safely for 2 minutes    3. Sitting with back unsupported, feet supported 4. able to sit safely and securely for 2 minutes    4. Standing to sitting 3. controls descent by using hands    5. Pivot transfer  3. able to transfer safely with definite need of hands    6. Standing unsupported with eyes closed 3. able to stand 10 seconds with supervision    7. Standing unsupported with feet together 3. able to place feet together independently and stand 1 minute with supervision    8. Reaching forward with outstretched arms while standing 2. can reach forward 5 cm (2 inches)    9. Pick up object from the floor from standing 0. unable to try/needs assistance to keep from losing balance or falling    10. Turning to look behind over left and  right shoulders while standing 2. turns sideways but only maintains balance    11. Turn 360 degrees 0. needs assistance while turning    12. Place alternate foot on step or stool while standing unsupported 1. able to complete > 2 steps needs minimal assist    13. Standing unsupported one foot in front 0. loses balance while stepping or standing    14. Standing on one leg 0. unable to try of needs assist to prevent fall      Total Score 28/56 Total Score:    Total Score:      PATIENT  SURVEYS:  ABC scale: The Activities-Specific Balance Confidence (ABC) Scale 0% 10 20 30  40 50 60 70 80 90 100% No confidence<->completely confident  How confident are you that you will not lose your balance or become unsteady when you . . .   Date tested 11/06/2024  Walk around the house 60%  2. Walk up or down stairs 30%  3. Bend over and pick up a slipper from in front of a closet floor 0%  4. Reach for a small can off a shelf at eye level 90%  5. Stand on tip toes and reach for something above your head 0%  6. Stand on a chair and reach for something 0%  7. Sweep the floor 90%  8. Walk outside the house to a car parked in the driveway 80%  9. Get into or out of a car 100  10. Walk across a parking lot to the mall 80%  11. Walk up or down a ramp 90%  12. Walk in a crowded mall where people rapidly walk past you 80%  13. Are bumped into by people as you walk through the mall 30%  14. Step onto or off of an escalator while you are holding onto the railing 0%  15. Step onto or off an escalator while holding onto parcels such that you cannot hold onto the railing 0%  16. Walk outside on icy sidewalks 0%  Total: #/16 45.62    6 Min Walk Test:  Instructed patient to ambulate as quickly and as safely as possible for 6 minutes using LRAD. Patient was allowed to take standing rest breaks without stopping the test, but if the patient required a sitting rest break the clock would be stopped and the test would be over.  Results: 965 feet (294 meters, Avg speed 0.45m/s) using a 4WW  with SBA. Results indicate that the patient has reduced endurance with ambulation compared to age matched norms.  Age Matched Norms: 1-69 yo M: 59 F: 82, 77-79 yo M: 46 F: 471, 27-89 yo M: 417 F: 392 MDC: 58.21 meters (190.98 feet) or 50 meters (ANPTA Core Set of Outcome Measures for Adults with Neurologic Conditions, 2018)  TREATMENT DATE: 12/02/2024    TA: Walking in clinic using 4WW x 500 feet or 0.10 mi (added 12# ankle weight to basket of walker)  FWD Step tap onto 1st step x 12 reps each LE FWD step tap onto 2nd step x 12 reps each LE Lateral step tap onto 1st step x 12 Reps each LE  Dynamic standing - thoracic Rotation - reaching for cones approx chest height - left to right- 3 sets of 6 reps each Side.   Sit to stand 2 sets x 10 reps.   Walking in clinic using 4WW x 500 feet or 0.10 mi (added 12# ankle weight to basket of walker)    Standing - diagonal reaching for ball overhead on 1 side then rotate and pass ball to opp side below waist x 12 reps each side.        PATIENT EDUCATION: Education details: purpose of PT; plan of care; Discussed fear of falling and factor it may play in rehab. Person educated: Patient Education method: Explanation Education comprehension: verbalized understanding  HOME EXERCISE PROGRAM: Standing HEP as mentioned above   GOALS: Goals reviewed with patient? Yes  SHORT TERM GOALS: Target date: 12/19/2024  Pt will be independent with HEP in order to improve strength and balance in order to decrease fall risk and improve function at home and work.  Baseline: EVAL- No formal HEP in place Goal status: INITIAL  LONG TERM GOALS: Target date: 01/30/2024  1.  Patient will complete >10 reps of 30 sec SIT TO STAND Test (using min 1 UE support or no Support) indicating an increased LE strength and improved balance. Baseline: EVAL= 6 reps with 1 UE support Goal status: INITIAL  2.  Patient will improve ABC score by 15 percentage points to demonstrate statistically significant improvement in mobility and quality of life as it relates to their confidence in balance.  Baseline: EVAL- 45.62% Goal status: INITIAL   3.  Patient will increase Berg Balance score by > 6 points to demonstrate decreased fall risk during functional  activities. Baseline: EVAL= 28/56 Goal status: INITIAL   4.   Patient will reduce timed up and go to <11 seconds to reduce fall risk and demonstrate improved transfer/gait ability. Baseline: EVAL-22.09 sec with 4WW Goal status: INITIAL  5.   Patient will increase 10 meter walk test to >1.29m/s as to improve gait speed for better community ambulation and to reduce fall risk. Baseline: EVAL=0.82 m/s with 4WW Goal status: INITIAL  6.   Patient will increase six minute walk test distance to >1100 for progression to community ambulator and improve gait ability Baseline: To be assessed visit #2; 11/13/2024 965 feet w 4WW  Goal status: REVISED  ASSESSMENT:  CLINICAL IMPRESSION: Patient is a 89 y.o. female who was seen today for physical therapy treatment for essential HTN and recent fall. Treatment returned to more functional endurance and LE strengthening today. She responded well overall- able to walk with some resistance and able to perform some trunk twist without report of increased pain.  Patient will benefit from skilled PT services to assist with muscle strength, functional mobility and balance to improve her functional capabilities and mobility with decreased risk of falling.   OBJECTIVE IMPAIRMENTS: Abnormal gait, decreased activity tolerance, decreased balance, decreased coordination, decreased endurance, decreased mobility, difficulty walking, decreased strength, impaired flexibility, postural dysfunction, and pain.   ACTIVITY LIMITATIONS: carrying, lifting, bending, sitting, standing, squatting, sleeping, stairs, transfers, bed mobility, bathing, dressing, and hygiene/grooming  PARTICIPATION LIMITATIONS: meal  prep, cleaning, laundry, shopping, community activity, and yard work  PERSONAL FACTORS: Age and 1 comorbidity: scoliosis and fear of falling are also affecting patient's functional outcome.   REHAB POTENTIAL: Good  CLINICAL DECISION MAKING:  Stable/uncomplicated  EVALUATION COMPLEXITY: Low  PLAN:  PT FREQUENCY: 1-2x/week  PT DURATION: 12 weeks  PLANNED INTERVENTIONS: 97164- PT Re-evaluation, 97750- Physical Performance Testing, 97110-Therapeutic exercises, 97530- Therapeutic activity, [id number]- Neuromuscular re-education, 97535- Self Care, [id number]- Manual therapy, [id number]- Gait training, [id number]- Orthotic Initial, [id number]- Orthotic/Prosthetic subsequent, [phone]- Canalith repositioning, [id number]- Electrical stimulation (manual), [phone] (1-2 muscles), [id number] (3+ muscles)- Dry Needling, Patient/Family education, Balance training, Stair training, Taping, Joint mobilization, Vestibular training, DME instructions, Cryotherapy, and Moist heat  PLAN FOR NEXT SESSION:   Initiate balance training in clinic - static and dynamic and add to HEP as appropriate.   Reyes LOISE London, PT 12/02/2024, 4:21 PM        "

## 2024-12-11 ENCOUNTER — Ambulatory Visit

## 2024-12-11 DIAGNOSIS — R278 Other lack of coordination: Secondary | ICD-10-CM

## 2024-12-11 DIAGNOSIS — M6281 Muscle weakness (generalized): Secondary | ICD-10-CM | POA: Diagnosis not present

## 2024-12-11 DIAGNOSIS — R262 Difficulty in walking, not elsewhere classified: Secondary | ICD-10-CM

## 2024-12-11 DIAGNOSIS — M5459 Other low back pain: Secondary | ICD-10-CM

## 2024-12-11 DIAGNOSIS — R2681 Unsteadiness on feet: Secondary | ICD-10-CM

## 2024-12-11 DIAGNOSIS — R2689 Other abnormalities of gait and mobility: Secondary | ICD-10-CM

## 2024-12-11 NOTE — Therapy (Signed)
 " OUTPATIENT PHYSICAL THERAPY NEURO TREATMENT   Patient Name: Kristina Rowe MRN: [id number] DOB:09-22-1930, 89 y.o., female Today's Date: 12/11/2024   PCP: Dr. Lavenia Beaver  REFERRING PROVIDER: Dr. Lavenia Beaver  END OF SESSION:  PT End of Session - 12/11/24 1541     Visit Number 5    Number of Visits 24    Date for Recertification  01/29/25    Progress Note Due on Visit 10    PT Start Time 1536    Equipment Utilized During Treatment Gait belt    Activity Tolerance Patient tolerated treatment well;No increased pain    Behavior During Therapy WFL for tasks assessed/performed           Past Medical History:  Diagnosis Date   Ductal carcinoma in situ (DCIS) of left breast    Dysphagia    Esophageal stricture    Gastroparesis    GERD (gastroesophageal reflux disease)    Hiatal hernia    Hypertension    Iron deficiency anemia    Osteoporosis    Pulmonary embolism (HCC)    Scoliosis    UTI (urinary tract infection)    Past Surgical History:  Procedure Laterality Date   BREAST LUMPECTOMY Left 2009   Ductal Carcinoma insitu    CATARACT EXTRACTION, BILATERAL     COLONOSCOPY N/A 03/17/2021   Procedure: COLONOSCOPY;  Surgeon: Maryruth Ole DASEN, MD;  Location: [facility];  Service: Endoscopy;  Laterality: N/A;   COLOSTOMY REVISION Right 03/18/2021   Procedure: COLON RESECTION RIGHT;  Surgeon: Desiderio Schanz, MD;  Location: [facility];  Service: General;  Laterality: Right;   LAPAROSCOPIC PARAESOPHAGEAL HERNIA REPAIR  2009   LAPAROTOMY N/A 03/10/2021   Procedure: EXPLORATORY LAPAROTOMY;  Surgeon: Desiderio Schanz, MD;  Location: [facility];  Service: General;  Laterality: N/A;   TUBAL LIGATION     Patient Active Problem List   Diagnosis Date Noted   Acute on chronic diastolic CHF (congestive heart failure) (HCC) 08/19/2023   Acute respiratory failure with hypoxia (HCC) 08/17/2023   History of pulmonary embolus (PE) 08/17/2023   Basal cell carcinoma of leg  04/01/2021   Coronary atherosclerosis 04/01/2021   History of pulmonary embolism 04/01/2021   Pulmonary nodule 04/01/2021   Malignant neoplasm of colon (HCC) 03/29/2021   Rectal bleeding 03/15/2021   Hypertension    GERD (gastroesophageal reflux disease)    Colonic mass    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HCC) 03/10/2021   Anemia, unspecified 03/06/2021   Bronchiectasis without complication (HCC) 03/27/2020   Iron deficiency anemia 03/27/2020   Moderate aortic stenosis 03/27/2020   Stage 3a chronic kidney disease (HCC) 07/04/2019   Hypnic headache 05/28/2019   Telogen effluvium 04/17/2019   Xerosis cutis 04/17/2019   Essential hypertension 01/14/2019   Cervical radiculopathy 05/04/2018   Osteoarthritis of left glenohumeral joint 05/04/2018   Vitamin B12 deficiency 01/01/2018   Vascular abnormality 09/11/2017   Dyspepsia 03/28/2017   Pulmonary embolism (HCC) 08/16/2016   Dilated pore of [place] 03/23/2015   Retinal drusen of both eyes 08/28/2014   Status post right cataract extraction 07/30/2014   Verruca 03/19/2014   Chronic back pain 11/06/2013   Postmenopausal osteoporosis 06/11/2013   Anticoagulated on Coumadin  04/06/2013   Long term (current) use of anticoagulants 10/18/2012   Preglaucoma 05/10/2012   Presbyopia 05/10/2012   Senile nuclear sclerosis 05/10/2012   Lobular carcinoma in situ of breast 04/13/2012   Closed fracture of ankle 02/27/2012   History of nonmelanoma skin cancer 09/05/2011   Other benign  neoplasm of skin of trunk 09/05/2011   Osteoporosis 08/25/2011   Transient alteration of awareness 09/03/2009   Colon adenoma 07/30/2002    ONSET DATE: 10/05/2024  REFERRING DIAG: Essential HTN; Fall at home; High Risk for Falls  THERAPY DIAG:  Muscle weakness (generalized)  Difficulty in walking, not elsewhere classified  Unsteadiness on feet  Other abnormalities of gait and mobility  Other low back pain  Other lack of coordination  Rationale for Evaluation  and Treatment: Rehabilitation  SUBJECTIVE:                                                                                                                                                                                             SUBJECTIVE STATEMENT: From Today: Patient reports she went to see her Cardiologist and he took her off amylodipine.    From EVAL: Patient reports falling in home stating her knees went out while changing clothes- fell into wall hitting her head.  Reports decreased confidence now in her balance and feels like she is not walking as well. States went for f/u after ED with her PCP and received an order to return to PT.   Pt accompanied by: self  PERTINENT HISTORY: Per hospital ED Note on 10/05/2024: Patient is a 89 year old female present emergency room today after mechanical fall that occurred when she pulled down her underwear use the restroom and then stood up and tripped she struck her right forehead against a wall and crumpled forwards she denies any chest pain or difficulty breathing no abdominal pain states that her only pain is mild headache and some right elbow pain. She also has some neck pain. No back pain however.   PAIN:  Are you having pain? Yes: NPRS scale: 6/10 lower throracic and LBP Pain location: Yes: NPRS scale: 6/10 lower throracic and LBP Pain description: sharp pain Aggravating factors: prolonged standing Relieving factors: Rest, walking, sit and rest   PRECAUTIONS: Fall  RED FLAGS: None   WEIGHT BEARING RESTRICTIONS: No  FALLS: Has patient fallen in last 6 months? Yes. Number of falls 1  LIVING ENVIRONMENT: Lives with: lives alone Lives in: House/apartment Stairs: Yes: External: 1- from garage  steps; none Has following equipment at home: Single point cane, Environmental Consultant - 4 wheeled, Wheelchair (manual), and Tour manager  PLOF: Independent- Driving, Independent with all household and community mobility with use of device.  PATIENT GOALS:  To be able to stand longer and feel secure with walking and less fearful of falling  OBJECTIVE:  Note: Objective measures were completed at Evaluation unless otherwise noted.  DIAGNOSTIC FINDINGS:  CT of C-spine on 11/8:IMPRESSION:  1. No acute traumatic injury identified in the cervical spine. 2. Cervical spine degeneration with probable mild  spinal stenosis.   Electronically signed by: Helayne Hurst MD 10/05/2024 10:24 AM EST RP  X-ray of pelvis 10/05/2024= IMPRESSION: 1. No acute fracture or dislocation identified about the pelvis.  COGNITION: Overall cognitive status: Within functional limits for tasks assessed   SENSATION: WFL  COORDINATION: Intact heel to shin bilateral  EDEMA:  None observed   POSTURE: rounded shoulders, forward head, increased thoracic kyphosis, and Severe flexed trunk with scoliosis  LOWER EXTREMITY ROM:     Active  Right Eval Left Eval  Hip flexion    Hip extension    Hip abduction    Hip adduction    Hip internal rotation    Hip external rotation    Knee flexion    Knee extension    Ankle dorsiflexion    Ankle plantarflexion    Ankle inversion    Ankle eversion     (Blank rows = not tested)  LOWER EXTREMITY MMT:    MMT Right Eval Left Eval  Hip flexion 4 4  Hip extension 4 4  Hip abduction 4 4  Hip adduction 4 4  Hip internal rotation 3+ 3+  Hip external rotation 3+ 3+  Knee flexion 4 4  Knee extension Lacking 44 deg from zero- 2-/5 4-  Ankle dorsiflexion 4 4  Ankle plantarflexion    Ankle inversion    Ankle eversion    (Blank rows = not tested)  BED MOBILITY:  Not tested  TRANSFERS: Sit to stand: CGA  Assistive device utilized: Environmental Consultant - 4 wheeled     Stand to sit: CGA  Assistive device utilized: Environmental Consultant - 4 wheeled      RAMP:  Not tested  CURB:  Not tested  STAIRS: Not tested GAIT: Findings: Gait Characteristics: step through pattern, decreased arm swing- Right, decreased arm swing- Left, decreased step  length- Right, and decreased step length- Left, Distance walked: approx 180 feet, Assistive device utilized:Walker - 4 wheeled, Level of assistance: CGA, and Comments: severe forward flexed posture due to scoliosis  FUNCTIONAL TESTS:  30 seconds chair stand test= 6 reps with RUE SUpport Timed up and go (TUG): 22.09 sec 6 minute walk test: to be assessed 2nd visit 10 meter walk test: 12.34 sec/ 12.10 sec and fast= 10.75 and 11.1 sec  Berg Balance Scale:  Item Test date: 11/06/2024 Date:  Date:   Sitting to standing 3. able to stand independently using hands Insert SmartPhrase OPRCBERGREEVAL Insert SmartPhrase OPRCBERGREEVAL  2. Standing unsupported 4. able to stand safely for 2 minutes    3. Sitting with back unsupported, feet supported 4. able to sit safely and securely for 2 minutes    4. Standing to sitting 3. controls descent by using hands    5. Pivot transfer  3. able to transfer safely with definite need of hands    6. Standing unsupported with eyes closed 3. able to stand 10 seconds with supervision    7. Standing unsupported with feet together 3. able to place feet together independently and stand 1 minute with supervision    8. Reaching forward with outstretched arms while standing 2. can reach forward 5 cm (2 inches)    9. Pick up object from the floor from standing 0. unable to try/needs assistance to keep from losing balance or falling    10. Turning to look behind over left and right shoulders while standing 2. turns sideways but  only maintains balance    11. Turn 360 degrees 0. needs assistance while turning    12. Place alternate foot on step or stool while standing unsupported 1. able to complete > 2 steps needs minimal assist    13. Standing unsupported one foot in front 0. loses balance while stepping or standing    14. Standing on one leg 0. unable to try of needs assist to prevent fall      Total Score 28/56 Total Score:    Total Score:      PATIENT SURVEYS:  ABC scale:  The Activities-Specific Balance Confidence (ABC) Scale 0% 10 20 30  40 50 60 70 80 90 100% No confidence<->completely confident  How confident are you that you will not lose your balance or become unsteady when you . . .   Date tested 11/06/2024  Walk around the house 60%  2. Walk up or down stairs 30%  3. Bend over and pick up a slipper from in front of a closet floor 0%  4. Reach for a small can off a shelf at eye level 90%  5. Stand on tip toes and reach for something above your head 0%  6. Stand on a chair and reach for something 0%  7. Sweep the floor 90%  8. Walk outside the house to a car parked in the driveway 80%  9. Get into or out of a car 100  10. Walk across a parking lot to the mall 80%  11. Walk up or down a ramp 90%  12. Walk in a crowded mall where people rapidly walk past you 80%  13. Are bumped into by people as you walk through the mall 30%  14. Step onto or off of an escalator while you are holding onto the railing 0%  15. Step onto or off an escalator while holding onto parcels such that you cannot hold onto the railing 0%  16. Walk outside on icy sidewalks 0%  Total: #/16 45.62    6 Min Walk Test:  Instructed patient to ambulate as quickly and as safely as possible for 6 minutes using LRAD. Patient was allowed to take standing rest breaks without stopping the test, but if the patient required a sitting rest break the clock would be stopped and the test would be over.  Results: 965 feet (294 meters, Avg speed 0.17m/s) using a 4WW  with SBA. Results indicate that the patient has reduced endurance with ambulation compared to age matched norms.  Age Matched Norms: 57-69 yo M: 66 F: 45, 27-79 yo M: 20 F: 471, 73-89 yo M: 417 F: 392 MDC: 58.21 meters (190.98 feet) or 50 meters (ANPTA Core Set of Outcome Measures for Adults with Neurologic Conditions, 2018)                                                                                                                               TREATMENT DATE: 12/11/2024  TA: -Walking in clinic using 4WW x 500 feet or 0.10 mi (added 14# ankle weight to basket of walker)  -FWD Step tap onto 4 step  2 x10reps each LE -FWD step up onto 4 x 12 reps each LE -Lateral step up onto 4  step x 12 Reps each LE -Standing hip circles (around blue foam roll) at support bar x 12 reps each Side.  -2nd round of walking with 4WW x feet (14# AW placed in basket of walker)  -Standing at support bar with Hip flex+ER to place foot on top of blue foam roll 2 x 10 reps ea LE    PATIENT EDUCATION: Education details: purpose of PT; plan of care; Discussed fear of falling and factor it may play in rehab. Person educated: Patient Education method: Explanation Education comprehension: verbalized understanding  HOME EXERCISE PROGRAM: Standing HEP as mentioned above   GOALS: Goals reviewed with patient? Yes  SHORT TERM GOALS: Target date: 12/19/2024  Pt will be independent with HEP in order to improve strength and balance in order to decrease fall risk and improve function at home and work.  Baseline: EVAL- No formal HEP in place Goal status: INITIAL  LONG TERM GOALS: Target date: 01/30/2024  1.  Patient will complete >10 reps of 30 sec SIT TO STAND Test (using min 1 UE support or no Support) indicating an increased LE strength and improved balance. Baseline: EVAL= 6 reps with 1 UE support Goal status: INITIAL  2.  Patient will improve ABC score by 15 percentage points to demonstrate statistically significant improvement in mobility and quality of life as it relates to their confidence in balance.  Baseline: EVAL- 45.62% Goal status: INITIAL   3.  Patient will increase Berg Balance score by > 6 points to demonstrate decreased fall risk during functional activities. Baseline: EVAL= 28/56 Goal status: INITIAL   4.   Patient will reduce timed up and go to <11 seconds to reduce fall risk and demonstrate improved transfer/gait  ability. Baseline: EVAL-22.09 sec with 4WW Goal status: INITIAL  5.   Patient will increase 10 meter walk test to >1.36m/s as to improve gait speed for better community ambulation and to reduce fall risk. Baseline: EVAL=0.82 m/s with 4WW Goal status: INITIAL  6.   Patient will increase six minute walk test distance to >1100 for progression to community ambulator and improve gait ability Baseline: To be assessed visit #2; 11/13/2024 965 feet w 4WW  Goal status: REVISED  ASSESSMENT:  CLINICAL IMPRESSION: Patient is a 89 y.o. female who was seen today for physical therapy treatment for essential HTN and recent fall. The patient demonstrated good overall performance during todays session. Treatment focused on progressing lower extremity strength and improving functional endurance, as evidenced by her ability to perform resistive gait activities with solid control and without pain. She also tolerated hip rotational and strengthening exercises well, completing all activities with appropriate form and no reports of discomfort. Fatigue continues to be her primary limiting factor, rather than pain, and contributes to reduced consistency with higher-level functional tasks. She will benefit from continued skilled physical therapy services to address persistent lower extremity weakness, reduced endurance, and mobility deficits. Ongoing intervention remains medically necessary to promote safe, independent functional mobility and to decrease her overall risk of falling.    OBJECTIVE IMPAIRMENTS: Abnormal gait, decreased activity tolerance, decreased balance, decreased coordination, decreased endurance, decreased mobility, difficulty walking, decreased strength, impaired flexibility, postural dysfunction, and pain.   ACTIVITY LIMITATIONS: carrying, lifting, bending,  sitting, standing, squatting, sleeping, stairs, transfers, bed mobility, bathing, dressing, and hygiene/grooming  PARTICIPATION LIMITATIONS:  meal prep, cleaning, laundry, shopping, community activity, and yard work  PERSONAL FACTORS: Age and 1 comorbidity: scoliosis and fear of falling are also affecting patient's functional outcome.   REHAB POTENTIAL: Good  CLINICAL DECISION MAKING: Stable/uncomplicated  EVALUATION COMPLEXITY: Low  PLAN:  PT FREQUENCY: 1-2x/week  PT DURATION: 12 weeks  PLANNED INTERVENTIONS: [id number]- PT Re-evaluation, 97750- Physical Performance Testing, 97110-Therapeutic exercises, 97530- Therapeutic activity, [id number]- Neuromuscular re-education, 97535- Self Care, [id number]- Manual therapy, [id number]- Gait training, [id number]- Orthotic Initial, [id number]- Orthotic/Prosthetic subsequent, [phone]- Canalith repositioning, [id number]- Electrical stimulation (manual), [phone] (1-2 muscles), [id number] (3+ muscles)- Dry Needling, Patient/Family education, Balance training, Stair training, Taping, Joint mobilization, Vestibular training, DME instructions, Cryotherapy, and Moist heat  PLAN FOR NEXT SESSION:   Progress lower extremity strengthening exercises with emphasis on functional, closed-chain activities to improve endurance and stability. Incorporate dynamic balance training, including multi-directional stepping, weight-shifting, and unstable-surface activities to reduce fall risk and enhance postural control. Advance hip stability interventions, focusing on proximal control through targeted strengthening and rotational stabilization exercises. Integrate postural awareness activities, including alignment cues during gait and therapeutic exercise, to support improved movement mechanics and reduce compensatory patterns. Continue to monitor fatigue levels and modify intensity as appropriate to optimize safety and performance.   Reyes LOISE London, PT 12/11/2024, 3:42 PM        "

## 2024-12-16 ENCOUNTER — Encounter: Payer: Self-pay | Admitting: Physician Assistant

## 2024-12-16 ENCOUNTER — Ambulatory Visit: Admitting: Physician Assistant

## 2024-12-16 VITALS — BP 141/76 | HR 88 | Ht 60.0 in | Wt 92.6 lb

## 2024-12-16 DIAGNOSIS — N3281 Overactive bladder: Secondary | ICD-10-CM

## 2024-12-16 NOTE — Progress Notes (Signed)
 PT[facility]  Session # 1  Health & Social Factors: No change Caffeine: 2 Alcohol: <1 Daytime voids #per day: 6 Night-time voids #per night: 2 Urgency: Strong Incontinence Episodes #per day: Nearly constant Ankle used: Left Treatment Setting: 7 Feeling/ Response: Both Comments: Patient tolerated well.  Consent signed.  No pacemaker.  Performed By: Keilan Nichol, PA-C   Follow Up: 1 week

## 2024-12-16 NOTE — Patient Instructions (Signed)

## 2024-12-18 ENCOUNTER — Ambulatory Visit

## 2024-12-18 DIAGNOSIS — R2689 Other abnormalities of gait and mobility: Secondary | ICD-10-CM

## 2024-12-18 DIAGNOSIS — M6281 Muscle weakness (generalized): Secondary | ICD-10-CM | POA: Diagnosis not present

## 2024-12-18 DIAGNOSIS — M5459 Other low back pain: Secondary | ICD-10-CM

## 2024-12-18 DIAGNOSIS — R2681 Unsteadiness on feet: Secondary | ICD-10-CM

## 2024-12-18 DIAGNOSIS — R278 Other lack of coordination: Secondary | ICD-10-CM

## 2024-12-18 DIAGNOSIS — R262 Difficulty in walking, not elsewhere classified: Secondary | ICD-10-CM

## 2024-12-18 NOTE — Therapy (Signed)
 " OUTPATIENT PHYSICAL THERAPY NEURO TREATMENT   Patient Name: Kristina Rowe MRN: [id number] DOB:December 20, 1929, 89 y.o., female Today's Date: 12/18/2024   PCP: Dr. Lavenia Beaver  REFERRING PROVIDER: Dr. Lavenia Beaver  END OF SESSION:  PT End of Session - 12/18/24 1410     Visit Number 6    Number of Visits 24    Date for Recertification  01/29/25    Progress Note Due on Visit 10    PT Start Time 1405    PT Stop Time 1445    PT Time Calculation (min) 40 min    Equipment Utilized During Treatment Gait belt    Activity Tolerance Patient tolerated treatment well;No increased pain    Behavior During Therapy WFL for tasks assessed/performed           Past Medical History:  Diagnosis Date   Ductal carcinoma in situ (DCIS) of left breast    Dysphagia    Esophageal stricture    Gastroparesis    GERD (gastroesophageal reflux disease)    Hiatal hernia    Hypertension    Iron deficiency anemia    Osteoporosis    Pulmonary embolism (HCC)    Scoliosis    UTI (urinary tract infection)    Past Surgical History:  Procedure Laterality Date   BREAST LUMPECTOMY Left 2009   Ductal Carcinoma insitu    CATARACT EXTRACTION, BILATERAL     COLONOSCOPY N/A 03/17/2021   Procedure: COLONOSCOPY;  Surgeon: Maryruth Ole DASEN, MD;  Location: [facility];  Service: Endoscopy;  Laterality: N/A;   COLOSTOMY REVISION Right 03/18/2021   Procedure: COLON RESECTION RIGHT;  Surgeon: Desiderio Schanz, MD;  Location: [facility];  Service: General;  Laterality: Right;   LAPAROSCOPIC PARAESOPHAGEAL HERNIA REPAIR  2009   LAPAROTOMY N/A 03/10/2021   Procedure: EXPLORATORY LAPAROTOMY;  Surgeon: Desiderio Schanz, MD;  Location: [facility];  Service: General;  Laterality: N/A;   TUBAL LIGATION     Patient Active Problem List   Diagnosis Date Noted   Acute on chronic diastolic CHF (congestive heart failure) (HCC) 08/19/2023   Acute respiratory failure with hypoxia (HCC) 08/17/2023   History of pulmonary  embolus (PE) 08/17/2023   Basal cell carcinoma of leg 04/01/2021   Coronary atherosclerosis 04/01/2021   History of pulmonary embolism 04/01/2021   Pulmonary nodule 04/01/2021   Malignant neoplasm of colon (HCC) 03/29/2021   Rectal bleeding 03/15/2021   Hypertension    GERD (gastroesophageal reflux disease)    Colonic mass    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HCC) 03/10/2021   Anemia, unspecified 03/06/2021   Bronchiectasis without complication (HCC) 03/27/2020   Iron deficiency anemia 03/27/2020   Moderate aortic stenosis 03/27/2020   Stage 3a chronic kidney disease (HCC) 07/04/2019   Hypnic headache 05/28/2019   Telogen effluvium 04/17/2019   Xerosis cutis 04/17/2019   Essential hypertension 01/14/2019   Cervical radiculopathy 05/04/2018   Osteoarthritis of left glenohumeral joint 05/04/2018   Vitamin B12 deficiency 01/01/2018   Vascular abnormality 09/11/2017   Dyspepsia 03/28/2017   Pulmonary embolism (HCC) 08/16/2016   Dilated pore of [place] 03/23/2015   Retinal drusen of both eyes 08/28/2014   Status post right cataract extraction 07/30/2014   Verruca 03/19/2014   Chronic back pain 11/06/2013   Postmenopausal osteoporosis 06/11/2013   Anticoagulated on Coumadin  04/06/2013   Long term (current) use of anticoagulants 10/18/2012   Preglaucoma 05/10/2012   Presbyopia 05/10/2012   Senile nuclear sclerosis 05/10/2012   Lobular carcinoma in situ of breast 04/13/2012   Closed  fracture of ankle 02/27/2012   History of nonmelanoma skin cancer 09/05/2011   Other benign neoplasm of skin of trunk 09/05/2011   Osteoporosis 08/25/2011   Transient alteration of awareness 09/03/2009   Colon adenoma 07/30/2002    ONSET DATE: 10/05/2024  REFERRING DIAG: Essential HTN; Fall at home; High Risk for Falls  THERAPY DIAG:  Muscle weakness (generalized)  Difficulty in walking, not elsewhere classified  Unsteadiness on feet  Other abnormalities of gait and mobility  Other low back  pain  Other lack of coordination  Rationale for Evaluation and Treatment: Rehabilitation  SUBJECTIVE:                                                                                                                                                                                             SUBJECTIVE STATEMENT: From Today: Patient reports doing well without any new complaints    From EVAL: Patient reports falling in home stating her knees went out while changing clothes- fell into wall hitting her head.  Reports decreased confidence now in her balance and feels like she is not walking as well. States went for f/u after ED with her PCP and received an order to return to PT.   Pt accompanied by: self  PERTINENT HISTORY: Per hospital ED Note on 10/05/2024: Patient is a 89 year old female present emergency room today after mechanical fall that occurred when she pulled down her underwear use the restroom and then stood up and tripped she struck her right forehead against a wall and crumpled forwards she denies any chest pain or difficulty breathing no abdominal pain states that her only pain is mild headache and some right elbow pain. She also has some neck pain. No back pain however.   PAIN:  Are you having pain? Yes: NPRS scale: 6/10 lower throracic and LBP Pain location: Yes: NPRS scale: 6/10 lower throracic and LBP Pain description: sharp pain Aggravating factors: prolonged standing Relieving factors: Rest, walking, sit and rest   PRECAUTIONS: Fall  RED FLAGS: None   WEIGHT BEARING RESTRICTIONS: No  FALLS: Has patient fallen in last 6 months? Yes. Number of falls 1  LIVING ENVIRONMENT: Lives with: lives alone Lives in: House/apartment Stairs: Yes: External: 1- from garage  steps; none Has following equipment at home: Single point cane, Environmental Consultant - 4 wheeled, Wheelchair (manual), and Tour manager  PLOF: Independent- Driving, Independent with all household and community mobility  with use of device.  PATIENT GOALS: To be able to stand longer and feel secure with walking and less fearful of falling  OBJECTIVE:  Note: Objective measures were completed at Evaluation unless otherwise  noted.  DIAGNOSTIC FINDINGS:  CT of C-spine on 11/8:IMPRESSION: 1. No acute traumatic injury identified in the cervical spine. 2. Cervical spine degeneration with probable mild  spinal stenosis.   Electronically signed by: Helayne Hurst MD 10/05/2024 10:24 AM EST RP  X-ray of pelvis 10/05/2024= IMPRESSION: 1. No acute fracture or dislocation identified about the pelvis.  COGNITION: Overall cognitive status: Within functional limits for tasks assessed   SENSATION: WFL  COORDINATION: Intact heel to shin bilateral  EDEMA:  None observed   POSTURE: rounded shoulders, forward head, increased thoracic kyphosis, and Severe flexed trunk with scoliosis  LOWER EXTREMITY ROM:     Active  Right Eval Left Eval  Hip flexion    Hip extension    Hip abduction    Hip adduction    Hip internal rotation    Hip external rotation    Knee flexion    Knee extension    Ankle dorsiflexion    Ankle plantarflexion    Ankle inversion    Ankle eversion     (Blank rows = not tested)  LOWER EXTREMITY MMT:    MMT Right Eval Left Eval  Hip flexion 4 4  Hip extension 4 4  Hip abduction 4 4  Hip adduction 4 4  Hip internal rotation 3+ 3+  Hip external rotation 3+ 3+  Knee flexion 4 4  Knee extension Lacking 44 deg from zero- 2-/5 4-  Ankle dorsiflexion 4 4  Ankle plantarflexion    Ankle inversion    Ankle eversion    (Blank rows = not tested)  BED MOBILITY:  Not tested  TRANSFERS: Sit to stand: CGA  Assistive device utilized: Environmental Consultant - 4 wheeled     Stand to sit: CGA  Assistive device utilized: Environmental Consultant - 4 wheeled      RAMP:  Not tested  CURB:  Not tested  STAIRS: Not tested GAIT: Findings: Gait Characteristics: step through pattern, decreased arm swing- Right, decreased  arm swing- Left, decreased step length- Right, and decreased step length- Left, Distance walked: approx 180 feet, Assistive device utilized:Walker - 4 wheeled, Level of assistance: CGA, and Comments: severe forward flexed posture due to scoliosis  FUNCTIONAL TESTS:  30 seconds chair stand test= 6 reps with RUE SUpport Timed up and go (TUG): 22.09 sec 6 minute walk test: to be assessed 2nd visit 10 meter walk test: 12.34 sec/ 12.10 sec and fast= 10.75 and 11.1 sec  Berg Balance Scale:  Item Test date: 11/06/2024 Date:  Date:   Sitting to standing 3. able to stand independently using hands Insert SmartPhrase OPRCBERGREEVAL Insert SmartPhrase OPRCBERGREEVAL  2. Standing unsupported 4. able to stand safely for 2 minutes    3. Sitting with back unsupported, feet supported 4. able to sit safely and securely for 2 minutes    4. Standing to sitting 3. controls descent by using hands    5. Pivot transfer  3. able to transfer safely with definite need of hands    6. Standing unsupported with eyes closed 3. able to stand 10 seconds with supervision    7. Standing unsupported with feet together 3. able to place feet together independently and stand 1 minute with supervision    8. Reaching forward with outstretched arms while standing 2. can reach forward 5 cm (2 inches)    9. Pick up object from the floor from standing 0. unable to try/needs assistance to keep from losing balance or falling    10. Turning to look behind over  left and right shoulders while standing 2. turns sideways but only maintains balance    11. Turn 360 degrees 0. needs assistance while turning    12. Place alternate foot on step or stool while standing unsupported 1. able to complete > 2 steps needs minimal assist    13. Standing unsupported one foot in front 0. loses balance while stepping or standing    14. Standing on one leg 0. unable to try of needs assist to prevent fall      Total Score 28/56 Total Score:    Total Score:       PATIENT SURVEYS:  ABC scale: The Activities-Specific Balance Confidence (ABC) Scale 0% 10 20 30  40 50 60 70 80 90 100% No confidence<->completely confident  How confident are you that you will not lose your balance or become unsteady when you . . .   Date tested 11/06/2024  Walk around the house 60%  2. Walk up or down stairs 30%  3. Bend over and pick up a slipper from in front of a closet floor 0%  4. Reach for a small can off a shelf at eye level 90%  5. Stand on tip toes and reach for something above your head 0%  6. Stand on a chair and reach for something 0%  7. Sweep the floor 90%  8. Walk outside the house to a car parked in the driveway 80%  9. Get into or out of a car 100  10. Walk across a parking lot to the mall 80%  11. Walk up or down a ramp 90%  12. Walk in a crowded mall where people rapidly walk past you 80%  13. Are bumped into by people as you walk through the mall 30%  14. Step onto or off of an escalator while you are holding onto the railing 0%  15. Step onto or off an escalator while holding onto parcels such that you cannot hold onto the railing 0%  16. Walk outside on icy sidewalks 0%  Total: #/16 45.62    6 Min Walk Test:  Instructed patient to ambulate as quickly and as safely as possible for 6 minutes using LRAD. Patient was allowed to take standing rest breaks without stopping the test, but if the patient required a sitting rest break the clock would be stopped and the test would be over.  Results: 965 feet (294 meters, Avg speed 0.89m/s) using a 4WW  with SBA. Results indicate that the patient has reduced endurance with ambulation compared to age matched norms.  Age Matched Norms: 67-69 yo M: 5 F: 52, 70-79 yo M: 99 F: 471, 81-89 yo M: 417 F: 392 MDC: 58.21 meters (190.98 feet) or 50 meters (ANPTA Core Set of Outcome Measures for Adults with Neurologic Conditions, 2018)  TREATMENT DATE: 12/18/24   NMR:  -Dynamic high knee step/walk in // bars- down/back x 10 -Lateral Side stepping in //bars- down/back -x 8   Therapeutic Activities: dynamic therapeutic activities designed to achieve improved functional performance -walking lunges in // bars - down and back x 3 -Toe walking in // bars - down and back x 3 -Dynamic hip circles - around blue foam roll 2 x 10  -Dynamic hip march+ER (placing foot on top of blue foam) 2 x 10 reps each LE.   -Walking in clinic using 4WW x 500 feet or 0.10 mi (   PATIENT EDUCATION: Education details: purpose of PT; plan of care; Discussed fear of falling and factor it may play in rehab. Person educated: Patient Education method: Explanation Education comprehension: verbalized understanding  HOME EXERCISE PROGRAM: Standing HEP as mentioned above   GOALS: Goals reviewed with patient? Yes  SHORT TERM GOALS: Target date: 12/19/2024  Pt will be independent with HEP in order to improve strength and balance in order to decrease fall risk and improve function at home and work.  Baseline: EVAL- No formal HEP in place Goal status: INITIAL  LONG TERM GOALS: Target date: 01/30/2024  1.  Patient will complete >10 reps of 30 sec SIT TO STAND Test (using min 1 UE support or no Support) indicating an increased LE strength and improved balance. Baseline: EVAL= 6 reps with 1 UE support Goal status: INITIAL  2.  Patient will improve ABC score by 15 percentage points to demonstrate statistically significant improvement in mobility and quality of life as it relates to their confidence in balance.  Baseline: EVAL- 45.62% Goal status: INITIAL   3.  Patient will increase Berg Balance score by > 6 points to demonstrate decreased fall risk during functional activities. Baseline: EVAL= 28/56 Goal status: INITIAL   4.   Patient will reduce timed up and go to <11 seconds to reduce fall risk and  demonstrate improved transfer/gait ability. Baseline: EVAL-22.09 sec with 4WW Goal status: INITIAL  5.   Patient will increase 10 meter walk test to >1.79m/s as to improve gait speed for better community ambulation and to reduce fall risk. Baseline: EVAL=0.82 m/s with 4WW Goal status: INITIAL  6.   Patient will increase six minute walk test distance to >1100 for progression to community ambulator and improve gait ability Baseline: To be assessed visit #2; 11/13/2024 965 feet w 4WW  Goal status: REVISED  ASSESSMENT:  CLINICAL IMPRESSION: Patient is a 89 y.o. female who was seen today for physical therapy treatment for essential HTN and recent fall. Patient demonstrated improved dynamic lower extremity control and functional mobility during neuromuscular reeducation and therapeutic activities. Performance of high knee stepping, lateral side-stepping, and dynamic hip activities in parallel bars showed adequate balance responses with minimal reliance on external support. Walking lunges, toe walking, and dynamic hip strengthening tasks challenge single-limb stability and proximal control, with patient tolerating activities well. Ambulation using a 4-wheeled walker for 500 feet indicates improving endurance and gait efficiency, though continued focus on balance, hip stability, and gait mechanics is warranted to further enhance safety and functional independence.    OBJECTIVE IMPAIRMENTS: Abnormal gait, decreased activity tolerance, decreased balance, decreased coordination, decreased endurance, decreased mobility, difficulty walking, decreased strength, impaired flexibility, postural dysfunction, and pain.   ACTIVITY LIMITATIONS: carrying, lifting, bending, sitting, standing, squatting, sleeping, stairs, transfers, bed mobility, bathing, dressing, and hygiene/grooming  PARTICIPATION LIMITATIONS: meal prep, cleaning, laundry, shopping, community activity, and yard work  PERSONAL FACTORS: Age  and 1  comorbidity: scoliosis and fear of falling are also affecting patient's functional outcome.   REHAB POTENTIAL: Good  CLINICAL DECISION MAKING: Stable/uncomplicated  EVALUATION COMPLEXITY: Low  PLAN:  PT FREQUENCY: 1-2x/week  PT DURATION: 12 weeks  PLANNED INTERVENTIONS: [id number]- PT Re-evaluation, 97750- Physical Performance Testing, 97110-Therapeutic exercises, 97530- Therapeutic activity, [id number]- Neuromuscular re-education, 97535- Self Care, [id number]- Manual therapy, [id number]- Gait training, [id number]- Orthotic Initial, [id number]- Orthotic/Prosthetic subsequent, [phone]- Canalith repositioning, [id number]- Electrical stimulation (manual), [phone] (1-2 muscles), [id number] (3+ muscles)- Dry Needling, Patient/Family education, Balance training, Stair training, Taping, Joint mobilization, Vestibular training, DME instructions, Cryotherapy, and Moist heat  PLAN FOR NEXT SESSION:   Progress lower extremity strengthening exercises with emphasis on functional, closed-chain activities to improve endurance and stability. Incorporate dynamic balance training, including multi-directional stepping, weight-shifting, and unstable-surface activities to reduce fall risk and enhance postural control. Advance hip stability interventions, focusing on proximal control through targeted strengthening and rotational stabilization exercises. Integrate postural awareness activities, including alignment cues during gait and therapeutic exercise, to support improved movement mechanics and reduce compensatory patterns. Continue to monitor fatigue levels and modify intensity as appropriate to optimize safety and performance.   Reyes LOISE London, PT 12/18/2024, 2:49 PM        "

## 2024-12-19 ENCOUNTER — Ambulatory Visit

## 2024-12-23 ENCOUNTER — Ambulatory Visit

## 2024-12-25 ENCOUNTER — Ambulatory Visit

## 2024-12-30 ENCOUNTER — Ambulatory Visit

## 2024-12-31 ENCOUNTER — Ambulatory Visit: Attending: Physician Assistant

## 2024-12-31 DIAGNOSIS — R2681 Unsteadiness on feet: Secondary | ICD-10-CM

## 2024-12-31 DIAGNOSIS — R2689 Other abnormalities of gait and mobility: Secondary | ICD-10-CM

## 2024-12-31 DIAGNOSIS — M6281 Muscle weakness (generalized): Secondary | ICD-10-CM

## 2024-12-31 DIAGNOSIS — R262 Difficulty in walking, not elsewhere classified: Secondary | ICD-10-CM

## 2024-12-31 NOTE — Therapy (Signed)
 " OUTPATIENT PHYSICAL THERAPY NEURO TREATMENT   Patient Name: Kristina Rowe MRN: [id number] DOB:April 29, 1930, 89 y.o., female Today's Date: 12/31/2024   PCP: Dr. Lavenia Beaver  REFERRING PROVIDER: Dr. Lavenia Beaver  END OF SESSION:  PT End of Session - 12/31/24 1402     Visit Number 7    Number of Visits 24    Date for Recertification  01/29/25    Progress Note Due on Visit 10    PT Start Time 1400    PT Stop Time 1444    PT Time Calculation (min) 44 min    Equipment Utilized During Treatment Gait belt    Activity Tolerance Patient tolerated treatment well;No increased pain    Behavior During Therapy WFL for tasks assessed/performed            Past Medical History:  Diagnosis Date   Ductal carcinoma in situ (DCIS) of left breast    Dysphagia    Esophageal stricture    Gastroparesis    GERD (gastroesophageal reflux disease)    Hiatal hernia    Hypertension    Iron deficiency anemia    Osteoporosis    Pulmonary embolism (HCC)    Scoliosis    UTI (urinary tract infection)    Past Surgical History:  Procedure Laterality Date   BREAST LUMPECTOMY Left 2009   Ductal Carcinoma insitu    CATARACT EXTRACTION, BILATERAL     COLONOSCOPY N/A 03/17/2021   Procedure: COLONOSCOPY;  Surgeon: Maryruth Ole DASEN, MD;  Location: [facility];  Service: Endoscopy;  Laterality: N/A;   COLOSTOMY REVISION Right 03/18/2021   Procedure: COLON RESECTION RIGHT;  Surgeon: Desiderio Schanz, MD;  Location: [facility];  Service: General;  Laterality: Right;   LAPAROSCOPIC PARAESOPHAGEAL HERNIA REPAIR  2009   LAPAROTOMY N/A 03/10/2021   Procedure: EXPLORATORY LAPAROTOMY;  Surgeon: Desiderio Schanz, MD;  Location: [facility];  Service: General;  Laterality: N/A;   TUBAL LIGATION     Patient Active Problem List   Diagnosis Date Noted   Acute on chronic diastolic CHF (congestive heart failure) (HCC) 08/19/2023   Acute respiratory failure with hypoxia (HCC) 08/17/2023   History of pulmonary  embolus (PE) 08/17/2023   Basal cell carcinoma of leg 04/01/2021   Coronary atherosclerosis 04/01/2021   History of pulmonary embolism 04/01/2021   Pulmonary nodule 04/01/2021   Malignant neoplasm of colon (HCC) 03/29/2021   Rectal bleeding 03/15/2021   Hypertension    GERD (gastroesophageal reflux disease)    Colonic mass    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HCC) 03/10/2021   Anemia, unspecified 03/06/2021   Bronchiectasis without complication (HCC) 03/27/2020   Iron deficiency anemia 03/27/2020   Moderate aortic stenosis 03/27/2020   Stage 3a chronic kidney disease (HCC) 07/04/2019   Hypnic headache 05/28/2019   Telogen effluvium 04/17/2019   Xerosis cutis 04/17/2019   Essential hypertension 01/14/2019   Cervical radiculopathy 05/04/2018   Osteoarthritis of left glenohumeral joint 05/04/2018   Vitamin B12 deficiency 01/01/2018   Vascular abnormality 09/11/2017   Dyspepsia 03/28/2017   Pulmonary embolism (HCC) 08/16/2016   Dilated pore of [place] 03/23/2015   Retinal drusen of both eyes 08/28/2014   Status post right cataract extraction 07/30/2014   Verruca 03/19/2014   Chronic back pain 11/06/2013   Postmenopausal osteoporosis 06/11/2013   Anticoagulated on Coumadin  04/06/2013   Long term (current) use of anticoagulants 10/18/2012   Preglaucoma 05/10/2012   Presbyopia 05/10/2012   Senile nuclear sclerosis 05/10/2012   Lobular carcinoma in situ of breast 04/13/2012  Closed fracture of ankle 02/27/2012   History of nonmelanoma skin cancer 09/05/2011   Other benign neoplasm of skin of trunk 09/05/2011   Osteoporosis 08/25/2011   Transient alteration of awareness 09/03/2009   Colon adenoma 07/30/2002    ONSET DATE: 10/05/2024  REFERRING DIAG: Essential HTN; Fall at home; High Risk for Falls  THERAPY DIAG:  Muscle weakness (generalized)  Difficulty in walking, not elsewhere classified  Unsteadiness on feet  Other abnormalities of gait and mobility  Rationale for Evaluation  and Treatment: Rehabilitation  SUBJECTIVE:                                                                                                                                                                                             SUBJECTIVE STATEMENT: Patient reports no falls or LOB, feels stiff from being in the house so much.    From EVAL: Patient reports falling in home stating her knees went out while changing clothes- fell into wall hitting her head.  Reports decreased confidence now in her balance and feels like she is not walking as well. States went for f/u after ED with her PCP and received an order to return to PT.   Pt accompanied by: self  PERTINENT HISTORY: Per hospital ED Note on 10/05/2024: Patient is a 89 year old female present emergency room today after mechanical fall that occurred when she pulled down her underwear use the restroom and then stood up and tripped she struck her right forehead against a wall and crumpled forwards she denies any chest pain or difficulty breathing no abdominal pain states that her only pain is mild headache and some right elbow pain. She also has some neck pain. No back pain however.   PAIN:  Are you having pain? Yes: NPRS scale: 6/10 lower throracic and LBP Pain location: Yes: NPRS scale: 6/10 lower throracic and LBP Pain description: sharp pain Aggravating factors: prolonged standing Relieving factors: Rest, walking, sit and rest   PRECAUTIONS: Fall  RED FLAGS: None   WEIGHT BEARING RESTRICTIONS: No  FALLS: Has patient fallen in last 6 months? Yes. Number of falls 1  LIVING ENVIRONMENT: Lives with: lives alone Lives in: House/apartment Stairs: Yes: External: 1- from garage  steps; none Has following equipment at home: Single point cane, Environmental Consultant - 4 wheeled, Wheelchair (manual), and Tour manager  PLOF: Independent- Driving, Independent with all household and community mobility with use of device.  PATIENT GOALS: To be able to  stand longer and feel secure with walking and less fearful of falling  OBJECTIVE:  Note: Objective measures were completed at Evaluation unless otherwise noted.  DIAGNOSTIC FINDINGS:  CT of C-spine on 11/8:IMPRESSION: 1. No acute traumatic injury identified in the cervical spine. 2. Cervical spine degeneration with probable mild  spinal stenosis.   Electronically signed by: Helayne Hurst MD 10/05/2024 10:24 AM EST RP  X-ray of pelvis 10/05/2024= IMPRESSION: 1. No acute fracture or dislocation identified about the pelvis.  COGNITION: Overall cognitive status: Within functional limits for tasks assessed   SENSATION: WFL  COORDINATION: Intact heel to shin bilateral  EDEMA:  None observed   POSTURE: rounded shoulders, forward head, increased thoracic kyphosis, and Severe flexed trunk with scoliosis  LOWER EXTREMITY ROM:     Active  Right Eval Left Eval  Hip flexion    Hip extension    Hip abduction    Hip adduction    Hip internal rotation    Hip external rotation    Knee flexion    Knee extension    Ankle dorsiflexion    Ankle plantarflexion    Ankle inversion    Ankle eversion     (Blank rows = not tested)  LOWER EXTREMITY MMT:    MMT Right Eval Left Eval  Hip flexion 4 4  Hip extension 4 4  Hip abduction 4 4  Hip adduction 4 4  Hip internal rotation 3+ 3+  Hip external rotation 3+ 3+  Knee flexion 4 4  Knee extension Lacking 44 deg from zero- 2-/5 4-  Ankle dorsiflexion 4 4  Ankle plantarflexion    Ankle inversion    Ankle eversion    (Blank rows = not tested)  BED MOBILITY:  Not tested  TRANSFERS: Sit to stand: CGA  Assistive device utilized: Environmental Consultant - 4 wheeled     Stand to sit: CGA  Assistive device utilized: Environmental Consultant - 4 wheeled      RAMP:  Not tested  CURB:  Not tested  STAIRS: Not tested GAIT: Findings: Gait Characteristics: step through pattern, decreased arm swing- Right, decreased arm swing- Left, decreased step length- Right, and  decreased step length- Left, Distance walked: approx 180 feet, Assistive device utilized:Walker - 4 wheeled, Level of assistance: CGA, and Comments: severe forward flexed posture due to scoliosis  FUNCTIONAL TESTS:  30 seconds chair stand test= 6 reps with RUE SUpport Timed up and go (TUG): 22.09 sec 6 minute walk test: to be assessed 2nd visit 10 meter walk test: 12.34 sec/ 12.10 sec and fast= 10.75 and 11.1 sec  Berg Balance Scale:  Item Test date: 11/06/2024 Date:  Date:   Sitting to standing 3. able to stand independently using hands Insert SmartPhrase OPRCBERGREEVAL Insert SmartPhrase OPRCBERGREEVAL  2. Standing unsupported 4. able to stand safely for 2 minutes    3. Sitting with back unsupported, feet supported 4. able to sit safely and securely for 2 minutes    4. Standing to sitting 3. controls descent by using hands    5. Pivot transfer  3. able to transfer safely with definite need of hands    6. Standing unsupported with eyes closed 3. able to stand 10 seconds with supervision    7. Standing unsupported with feet together 3. able to place feet together independently and stand 1 minute with supervision    8. Reaching forward with outstretched arms while standing 2. can reach forward 5 cm (2 inches)    9. Pick up object from the floor from standing 0. unable to try/needs assistance to keep from losing balance or falling    10. Turning to look behind over left and right shoulders while  standing 2. turns sideways but only maintains balance    11. Turn 360 degrees 0. needs assistance while turning    12. Place alternate foot on step or stool while standing unsupported 1. able to complete > 2 steps needs minimal assist    13. Standing unsupported one foot in front 0. loses balance while stepping or standing    14. Standing on one leg 0. unable to try of needs assist to prevent fall      Total Score 28/56 Total Score:    Total Score:      PATIENT SURVEYS:  ABC scale: The  Activities-Specific Balance Confidence (ABC) Scale 0% 10 20 30  40 50 60 70 80 90 100% No confidence<->completely confident  How confident are you that you will not lose your balance or become unsteady when you . . .   Date tested 11/06/2024  Walk around the house 60%  2. Walk up or down stairs 30%  3. Bend over and pick up a slipper from in front of a closet floor 0%  4. Reach for a small can off a shelf at eye level 90%  5. Stand on tip toes and reach for something above your head 0%  6. Stand on a chair and reach for something 0%  7. Sweep the floor 90%  8. Walk outside the house to a car parked in the driveway 80%  9. Get into or out of a car 100  10. Walk across a parking lot to the mall 80%  11. Walk up or down a ramp 90%  12. Walk in a crowded mall where people rapidly walk past you 80%  13. Are bumped into by people as you walk through the mall 30%  14. Step onto or off of an escalator while you are holding onto the railing 0%  15. Step onto or off an escalator while holding onto parcels such that you cannot hold onto the railing 0%  16. Walk outside on icy sidewalks 0%  Total: #/16 45.62    6 Min Walk Test:  Instructed patient to ambulate as quickly and as safely as possible for 6 minutes using LRAD. Patient was allowed to take standing rest breaks without stopping the test, but if the patient required a sitting rest break the clock would be stopped and the test would be over.  Results: 965 feet (294 meters, Avg speed 0.83m/s) using a 4WW  with SBA. Results indicate that the patient has reduced endurance with ambulation compared to age matched norms.  Age Matched Norms: 1-69 yo M: 56 F: 28, 18-79 yo M: 33 F: 471, 1-89 yo M: 417 F: 392 MDC: 58.21 meters (190.98 feet) or 50 meters (ANPTA Core Set of Outcome Measures for Adults with Neurologic Conditions, 2018)  TREATMENT DATE: 12/31/24   NMR:  Standing with CGA next to support surface:  Airex pad: static stand 30 seconds x 2 trials, noticeable trembling of ankles/LE's with fatigue and challenge to maintain stability Airex pad: horizontal head turns 30 seconds scanning room 10x ; cueing for arc of motion  Airex pad: vertical head turns 30 seconds, cueing for arc of motion, noticeable sway with upward gaze increasing demand on ankle righting reaction musculature Airex pad: one foot on 6 step one foot on airex pad, hold position for 30 seconds, switch legs, 2x each LE; Airex pad 6 step toe taps 10x each LE, with UE support     Therapeutic Activities: dynamic therapeutic activities designed to achieve improved functional performance Walking toy soldiers 4x length of // bars  #4 ankle weights: -high knee march 4x length of // bars -lateral step 4x length of // bars  -backwards walking 4x length of // bars   PATIENT EDUCATION: Education details: purpose of PT; plan of care; Discussed fear of falling and factor it may play in rehab. Person educated: Patient Education method: Explanation Education comprehension: verbalized understanding  HOME EXERCISE PROGRAM: Standing HEP as mentioned above   GOALS: Goals reviewed with patient? Yes  SHORT TERM GOALS: Target date: 12/19/2024  Pt will be independent with HEP in order to improve strength and balance in order to decrease fall risk and improve function at home and work.  Baseline: EVAL- No formal HEP in place Goal status: INITIAL  LONG TERM GOALS: Target date: 01/30/2024  1.  Patient will complete >10 reps of 30 sec SIT TO STAND Test (using min 1 UE support or no Support) indicating an increased LE strength and improved balance. Baseline: EVAL= 6 reps with 1 UE support Goal status: INITIAL  2.  Patient will improve ABC score by 15 percentage points to demonstrate statistically significant improvement in mobility and quality of life  as it relates to their confidence in balance.  Baseline: EVAL- 45.62% Goal status: INITIAL   3.  Patient will increase Berg Balance score by > 6 points to demonstrate decreased fall risk during functional activities. Baseline: EVAL= 28/56 Goal status: INITIAL   4.   Patient will reduce timed up and go to <11 seconds to reduce fall risk and demonstrate improved transfer/gait ability. Baseline: EVAL-22.09 sec with 4WW Goal status: INITIAL  5.   Patient will increase 10 meter walk test to >1.65m/s as to improve gait speed for better community ambulation and to reduce fall risk. Baseline: EVAL=0.82 m/s with 4WW Goal status: INITIAL  6.   Patient will increase six minute walk test distance to >1100 for progression to community ambulator and improve gait ability Baseline: To be assessed visit #2; 11/13/2024 965 feet w 4WW  Goal status: REVISED  ASSESSMENT:  CLINICAL IMPRESSION: Patient is fatigued by end of session. Reports some pain in her back during marches that alleviates with rest. Use of ankle weights tolerated well. Patient would continue to benefit from skilled physical therapy to increase functional independence and mobility.     OBJECTIVE IMPAIRMENTS: Abnormal gait, decreased activity tolerance, decreased balance, decreased coordination, decreased endurance, decreased mobility, difficulty walking, decreased strength, impaired flexibility, postural dysfunction, and pain.   ACTIVITY LIMITATIONS: carrying, lifting, bending, sitting, standing, squatting, sleeping, stairs, transfers, bed mobility, bathing, dressing, and hygiene/grooming  PARTICIPATION LIMITATIONS: meal prep, cleaning, laundry, shopping, community activity, and yard work  PERSONAL FACTORS: Age and 1 comorbidity: scoliosis and fear of falling are also affecting patient's functional outcome.  REHAB POTENTIAL: Good  CLINICAL DECISION MAKING: Stable/uncomplicated  EVALUATION COMPLEXITY: Low  PLAN:  PT  FREQUENCY: 1-2x/week  PT DURATION: 12 weeks  PLANNED INTERVENTIONS: 97164- PT Re-evaluation, 97750- Physical Performance Testing, 97110-Therapeutic exercises, 97530- Therapeutic activity, [id number]- Neuromuscular re-education, 97535- Self Care, [id number]- Manual therapy, [id number]- Gait training, [id number]- Orthotic Initial, [id number]- Orthotic/Prosthetic subsequent, [phone]- Canalith repositioning, [id number]- Electrical stimulation (manual), [phone] (1-2 muscles), [id number] (3+ muscles)- Dry Needling, Patient/Family education, Balance training, Stair training, Taping, Joint mobilization, Vestibular training, DME instructions, Cryotherapy, and Moist heat  PLAN FOR NEXT SESSION:   Progress lower extremity strengthening exercises with emphasis on functional, closed-chain activities to improve endurance and stability. Incorporate dynamic balance training, including multi-directional stepping, weight-shifting, and unstable-surface activities to reduce fall risk and enhance postural control. Advance hip stability interventions, focusing on proximal control through targeted strengthening and rotational stabilization exercises. Integrate postural awareness activities, including alignment cues during gait and therapeutic exercise, to support improved movement mechanics and reduce compensatory patterns. Continue to monitor fatigue levels and modify intensity as appropriate to optimize safety and performance.   Khalil Belote, PT 12/31/2024, 2:44 PM        "

## 2025-01-02 ENCOUNTER — Ambulatory Visit

## 2025-01-06 ENCOUNTER — Ambulatory Visit

## 2025-01-08 ENCOUNTER — Ambulatory Visit: Admitting: Physical Therapy

## 2025-01-13 ENCOUNTER — Ambulatory Visit

## 2025-01-15 ENCOUNTER — Ambulatory Visit

## 2025-01-20 ENCOUNTER — Ambulatory Visit

## 2025-01-21 ENCOUNTER — Other Ambulatory Visit

## 2025-01-22 ENCOUNTER — Ambulatory Visit: Admitting: Physical Therapy

## 2025-01-27 ENCOUNTER — Ambulatory Visit

## 2025-01-29 ENCOUNTER — Ambulatory Visit: Attending: Physician Assistant

## 2025-02-03 ENCOUNTER — Ambulatory Visit

## 2025-02-05 ENCOUNTER — Ambulatory Visit

## 2025-02-10 ENCOUNTER — Ambulatory Visit

## 2025-02-12 ENCOUNTER — Ambulatory Visit

## 2025-02-17 ENCOUNTER — Ambulatory Visit

## 2025-02-19 ENCOUNTER — Ambulatory Visit

## 2025-02-24 ENCOUNTER — Ambulatory Visit

## 2025-02-26 ENCOUNTER — Ambulatory Visit: Attending: Physician Assistant

## 2025-03-03 ENCOUNTER — Ambulatory Visit

## 2025-03-05 ENCOUNTER — Ambulatory Visit

## 2025-03-10 ENCOUNTER — Ambulatory Visit

## 2025-03-12 ENCOUNTER — Ambulatory Visit

## 2025-03-17 ENCOUNTER — Ambulatory Visit

## 2025-03-19 ENCOUNTER — Ambulatory Visit

## 2025-03-24 ENCOUNTER — Ambulatory Visit

## 2025-03-26 ENCOUNTER — Ambulatory Visit

## 2025-04-02 ENCOUNTER — Ambulatory Visit: Attending: Physician Assistant

## 2025-04-09 ENCOUNTER — Ambulatory Visit

## 2025-04-16 ENCOUNTER — Ambulatory Visit

## 2025-04-23 ENCOUNTER — Ambulatory Visit

## 2025-04-30 ENCOUNTER — Ambulatory Visit: Attending: Physician Assistant

## 2025-05-07 ENCOUNTER — Ambulatory Visit

## 2025-05-14 ENCOUNTER — Ambulatory Visit

## 2025-06-23 ENCOUNTER — Other Ambulatory Visit

## 2025-07-22 ENCOUNTER — Other Ambulatory Visit

## 2025-07-22 ENCOUNTER — Ambulatory Visit: Admitting: Oncology

## 2025-07-23 ENCOUNTER — Ambulatory Visit: Admitting: Oncology

## 2025-07-23 ENCOUNTER — Other Ambulatory Visit
